# Patient Record
Sex: Male | Born: 1955
Health system: Southern US, Community
[De-identification: ages and names within clinical notes are randomized; demographics above are authoritative.]

## PROBLEM LIST (undated history)

## (undated) DIAGNOSIS — G5621 Lesion of ulnar nerve, right upper limb: Secondary | ICD-10-CM

## (undated) DIAGNOSIS — E785 Hyperlipidemia, unspecified: Secondary | ICD-10-CM

## (undated) DIAGNOSIS — N400 Enlarged prostate without lower urinary tract symptoms: Secondary | ICD-10-CM

## (undated) DIAGNOSIS — R39198 Other difficulties with micturition: Secondary | ICD-10-CM

## (undated) DIAGNOSIS — K432 Incisional hernia without obstruction or gangrene: Secondary | ICD-10-CM

## (undated) DIAGNOSIS — Q72819 Congenital shortening of unspecified lower limb: Secondary | ICD-10-CM

## (undated) DIAGNOSIS — K435 Parastomal hernia without obstruction or  gangrene: Secondary | ICD-10-CM

## (undated) DIAGNOSIS — M1990 Unspecified osteoarthritis, unspecified site: Secondary | ICD-10-CM

## (undated) DIAGNOSIS — Z933 Colostomy status: Secondary | ICD-10-CM

## (undated) DIAGNOSIS — I10 Essential (primary) hypertension: Secondary | ICD-10-CM

## (undated) DIAGNOSIS — R0600 Dyspnea, unspecified: Secondary | ICD-10-CM

## (undated) DIAGNOSIS — N529 Male erectile dysfunction, unspecified: Secondary | ICD-10-CM

## (undated) DIAGNOSIS — K219 Gastro-esophageal reflux disease without esophagitis: Secondary | ICD-10-CM

## (undated) DIAGNOSIS — G2581 Restless legs syndrome: Secondary | ICD-10-CM

## (undated) DIAGNOSIS — K4090 Unilateral inguinal hernia, without obstruction or gangrene, not specified as recurrent: Secondary | ICD-10-CM

## (undated) DIAGNOSIS — Z932 Ileostomy status: Secondary | ICD-10-CM

## (undated) DIAGNOSIS — R238 Other skin changes: Secondary | ICD-10-CM

## (undated) DIAGNOSIS — J690 Pneumonitis due to inhalation of food and vomit: Secondary | ICD-10-CM

## (undated) DIAGNOSIS — Z87442 Personal history of urinary calculi: Secondary | ICD-10-CM

## (undated) DIAGNOSIS — C20 Malignant neoplasm of rectum: Secondary | ICD-10-CM

## (undated) HISTORY — DX: Gastro-esophageal reflux disease without esophagitis: K21.9

## (undated) HISTORY — PX: HIP FRACTURE SURGERY: SHX118

## (undated) HISTORY — PX: COLON SURGERY: SHX602

## (undated) HISTORY — PX: JOINT REPLACEMENT: SHX530

## (undated) HISTORY — DX: Pneumonitis due to inhalation of food and vomit: J69.0

## (undated) HISTORY — PX: ILEOSTOMY: SHX1783

## (undated) HISTORY — PX: COLOSTOMY: SHX63

---
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
## 2005-08-04 ENCOUNTER — Emergency Department: Payer: Self-pay | Admitting: Emergency Medicine

## 2005-08-22 ENCOUNTER — Ambulatory Visit: Payer: Self-pay | Admitting: Internal Medicine

## 2008-03-18 ENCOUNTER — Ambulatory Visit (HOSPITAL_COMMUNITY): Admission: RE | Admit: 2008-03-18 | Discharge: 2008-03-18 | Payer: Self-pay | Admitting: Cardiovascular Disease

## 2013-10-27 ENCOUNTER — Encounter: Payer: Self-pay | Admitting: Family Medicine

## 2013-10-27 ENCOUNTER — Ambulatory Visit (INDEPENDENT_AMBULATORY_CARE_PROVIDER_SITE_OTHER): Payer: Self-pay | Admitting: Family Medicine

## 2013-10-27 VITALS — BP 140/88 | Ht 65.0 in | Wt 183.8 lb

## 2013-10-27 DIAGNOSIS — R079 Chest pain, unspecified: Secondary | ICD-10-CM

## 2013-10-27 DIAGNOSIS — K921 Melena: Secondary | ICD-10-CM

## 2013-10-27 DIAGNOSIS — M542 Cervicalgia: Secondary | ICD-10-CM

## 2013-10-27 NOTE — Progress Notes (Signed)
   Subjective:    Patient ID: [name], male    DOB: 1956-03-17, 58 y.o.   MRN: [id number]  HPI  Patient reported that he had eaten a lot of cheese and got "bound up" with severe constipation. Patient stated he strained a lot and felt lik something ripped inside and noticed bleeding since.  Patient also reports reflux at times. See prior notes did not get colonoscopy as encourage before  Notes throat discomfort. Has been comparing notes with folks at church. May have advised him he may have a cold or. At times he feels a swelling on the left side. No dysphasia. No sore throat.  Left shoulder discomfort, worse with certain motions.  And notes aching in chest and shoulder. On further history sometimes developed some left chest discomfort with exertion. It only when he is using his arm. Review of Systems No abdominal pain no back pain no rash no weight loss no aching ROS otherwise negative    Objective:   Physical Exam  Alert no apparent distress H&T neck supple left-sided nonspecific very faint tenderness. No discrete mass. Lungs clear. Heart regular in rhythm. Chest wall nontender. Abdomen benign. Rectal exam heme negative stool. No fissure prostate normal  EKG normal sinus rhythm question old anterior MI.    Assessment & Plan:  Impression intermittent hematochezia discussed #2 chest pain doubt serious etiology. Patient means well but he is not the best historian. I did an EKG which suggested old anterior MI. We are committed to further evaluate this. #3 nonspecific neck symptoms with concern regarding goiter. Plan GI referral. #2 cardiology referral. #3 ultrasound neck further recommendations based results. WSL

## 2013-10-28 ENCOUNTER — Encounter: Payer: Self-pay | Admitting: Family Medicine

## 2013-10-29 ENCOUNTER — Encounter: Payer: Self-pay | Admitting: Gastroenterology

## 2013-10-31 ENCOUNTER — Ambulatory Visit (HOSPITAL_COMMUNITY)
Admission: RE | Admit: 2013-10-31 | Discharge: 2013-10-31 | Disposition: A | Discharge status: Home / Self Care | Payer: Self-pay | Source: Ambulatory Visit | Attending: Family Medicine | Admitting: Family Medicine

## 2013-10-31 DIAGNOSIS — M542 Cervicalgia: Secondary | ICD-10-CM | POA: Insufficient documentation

## 2013-11-10 ENCOUNTER — Encounter: Payer: Self-pay | Admitting: Cardiology

## 2013-11-10 ENCOUNTER — Ambulatory Visit (INDEPENDENT_AMBULATORY_CARE_PROVIDER_SITE_OTHER): Payer: Self-pay | Admitting: Cardiology

## 2013-11-10 VITALS — BP 132/84 | HR 81 | Ht 65.0 in | Wt 180.0 lb

## 2013-11-10 DIAGNOSIS — R0789 Other chest pain: Secondary | ICD-10-CM | POA: Insufficient documentation

## 2013-11-10 DIAGNOSIS — Z87891 Personal history of nicotine dependence: Secondary | ICD-10-CM

## 2013-11-10 DIAGNOSIS — F17201 Nicotine dependence, unspecified, in remission: Secondary | ICD-10-CM | POA: Insufficient documentation

## 2013-11-10 DIAGNOSIS — R9431 Abnormal electrocardiogram [ECG] [EKG]: Secondary | ICD-10-CM

## 2013-11-10 NOTE — Assessment & Plan Note (Signed)
Seems to be most consistent with a musculoskeletal etiology. He does report history of elevated blood pressure at times, and his baseline ECG is abnormal, cannot exclude old anterior infarct pattern, although he is not aware of any history of CAD or infarct previously. For now he takes an aspirin daily and other supplements as outlined. We discussed a basic GXT for risk stratification. Unless these results are concerning, probably does not need further cardiac testing at this point.
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
## 2013-11-10 NOTE — Assessment & Plan Note (Signed)
States he quit smoking over a year ago.

## 2013-11-10 NOTE — Patient Instructions (Signed)
Your physician recommends that you schedule a follow-up appointment to be determined after stress test.we will call you with results    Your physician recommends that you continue on your current medications as directed. Please refer to the Current Medication list given to you today.   Thank you for choosing [facility] !

## 2013-11-11 ENCOUNTER — Encounter: Payer: Self-pay | Admitting: Cardiology

## 2013-11-17 ENCOUNTER — Ambulatory Visit (HOSPITAL_COMMUNITY)
Admission: RE | Admit: 2013-11-17 | Discharge: 2013-11-17 | Disposition: A | Discharge status: Home / Self Care | Payer: Self-pay | Source: Ambulatory Visit | Attending: Cardiology | Admitting: Cardiology

## 2013-11-17 ENCOUNTER — Encounter (HOSPITAL_COMMUNITY): Payer: Self-pay

## 2013-11-17 DIAGNOSIS — R0789 Other chest pain: Secondary | ICD-10-CM

## 2013-11-17 DIAGNOSIS — R9431 Abnormal electrocardiogram [ECG] [EKG]: Secondary | ICD-10-CM

## 2013-11-17 DIAGNOSIS — R079 Chest pain, unspecified: Secondary | ICD-10-CM | POA: Insufficient documentation

## 2013-11-17 NOTE — Progress Notes (Signed)
Stress Lab Nurses Notes - [name] 11/17/2013 Reason for doing test: Chest Pain Type of test: Regular GTX Nurse performing test: Gerrit Halls, RN Nuclear Medicine Tech: Not Applicable Echo Tech: Not Applicable MD performing test: Branch/K.Purcell Nails NP Family MD: Mickie Hillier Test explained and consent signed: yes IV started: No IV started Symptoms: Fatigue Treatment/Intervention: None Reason test stopped: fatigue and reached target HR After recovery IV was: NA Patient to return to [place]. Med at : NA Patient discharged: Home Patient's Condition upon discharge was: stable Comments: During test peak BP 173/99 & HR 146.  Recovery BP 147/98 & HR 85.  Symptoms resolved in recovery.  Geanie Cooley T

## 2013-11-17 NOTE — Progress Notes (Signed)
Patient ID: ROCHELL MABIE, male   DOB: 11-01-1955, 58 y.o.   MRN: [id number]  Test Indication:  pateint is a 58 yo with shoulder and neck pain.  Test to evaluate, r/o ischemia  Stress data:  Patientatient exercised 6 min 47 sec to a peak HR of 146 bpm  This was 89% predicted maximall.  Peak BP 173/99.  Patient experienced no CP  EKG showed no ST changes to suggest ischemia  Impression:  Exercise stress test:  Clinically and electrically negative for ischemia.

## 2013-11-20 ENCOUNTER — Ambulatory Visit: Payer: Self-pay | Admitting: Nurse Practitioner

## 2013-11-24 ENCOUNTER — Encounter: Payer: Self-pay | Admitting: Family Medicine

## 2013-11-24 ENCOUNTER — Telehealth: Payer: Self-pay

## 2013-11-24 NOTE — Telephone Encounter (Signed)
LM on patients personal voicemail that gxt was normal and he needs no further cardiac work up and to follow up with [facility]

## 2013-11-27 ENCOUNTER — Encounter: Payer: Self-pay | Admitting: Gastroenterology

## 2013-11-27 ENCOUNTER — Other Ambulatory Visit: Payer: Self-pay | Admitting: Gastroenterology

## 2013-11-27 ENCOUNTER — Ambulatory Visit (INDEPENDENT_AMBULATORY_CARE_PROVIDER_SITE_OTHER): Payer: Self-pay | Admitting: Gastroenterology

## 2013-11-27 VITALS — BP 151/95 | HR 70 | Temp 98.0°F | Ht 65.0 in | Wt 184.6 lb

## 2013-11-27 DIAGNOSIS — K219 Gastro-esophageal reflux disease without esophagitis: Secondary | ICD-10-CM

## 2013-11-27 DIAGNOSIS — K625 Hemorrhage of anus and rectum: Secondary | ICD-10-CM

## 2013-11-27 MED ORDER — OMEPRAZOLE 20 MG PO CPDR: DELAYED_RELEASE_CAPSULE | ORAL | Status: DC

## 2013-11-27 NOTE — Assessment & Plan Note (Signed)
IN SETTING OF CONSTIPATION AND HAVING TO DISIMPACT HIMSELF.  TCS FOR RECTAL BLEEDING MOVIPREP VOUCHER OPV IN 4 MOS

## 2013-11-27 NOTE — Progress Notes (Signed)
   Subjective:    Patient ID: [name], male    DOB: 1956/07/05, 58 y.o.   MRN: [id number]  Rubbie Battiest, MD  HPI PASSED BLOOD IN STOOL. 1 MO AGO ATE TOO MUCH CHEESE. STOOL GOT BOUND UP. COULDN'T PAS ANYTHING. HE WAS HURTING AND CRYING. HAD TO STICK FINGER IN BOWELS TO DISIMPACT HIMSELF. PASSED STRESS TEST LAST WEEK. HAS ACID REFLUX ALL THE TIME. FOR 6-8 MOS PAIN IN CHEST AND UP INTO HIS NECK. REFLUX ALL THE TIME. QUIT SMOKING 1.5 YRS AGO. GAINED SOME WEIGHT. OCCASIONAL PROBLEMS SWALLOWING SUPPLEMENTS. BMS; 2-3X/DAY. PT DENIES FEVER, CHILLS, nausea, vomiting, melena, diarrhea, constipation, OR abd pain.  Past Medical History  Diagnosis Date  . GERD (gastroesophageal reflux disease)     History reviewed. No pertinent past surgical history.  No Known Allergies  No current outpatient prescriptions on file.   No current facility-administered medications for this visit.    Family History  Problem Relation Age of Onset  . Colon cancer SISTER AGE 42   . Colon polyps Neg Hx     History  Substance Use Topics  . Smoking status: Former Smoker    Types: Cigarettes  . Smokeless tobacco: Not on file  . Alcohol Use: No               Review of Systems PER HPI OTHERWISE ALL SYSTEMS ARE NEGATIVE.     Objective:   Physical Exam  Vitals reviewed. Constitutional: He is oriented to person, place, and time. He appears well-nourished. No distress.  HENT:  Head: Normocephalic and atraumatic.  Mouth/Throat: Oropharynx is clear and moist. No oropharyngeal exudate.  Eyes: Pupils are equal, round, and reactive to light. No scleral icterus.  Neck: Normal range of motion. Neck supple.  Cardiovascular: Normal rate, regular rhythm and normal heart sounds.   Pulmonary/Chest: Effort normal and breath sounds normal. No respiratory distress.  Abdominal: Soft. Bowel sounds are normal. He exhibits no distension. There is no tenderness.  Musculoskeletal: He exhibits no edema.    Lymphadenopathy:    He has no cervical adenopathy.  Neurological: He is alert and oriented to person, place, and time.  NO FOCAL DEFICITS   Psychiatric: He has a normal mood and affect.          Assessment & Plan:

## 2013-11-27 NOTE — Patient Instructions (Signed)
UPPER AND LOWER ENDOSCOPY FOR RECTAL BLEEDING AND HAVING REFLUX  Use Prilosec 30 minutes prior to your first meal.  FOLLOW A HIGH FIBER/LOW FAT DIET. SEE INFO BELOW.   FOLLOW UP IN 4 MOS.    High-Fiber Diet A high-fiber diet changes your normal diet to include more whole grains, legumes, fruits, and vegetables. Changes in the diet involve replacing refined carbohydrates with unrefined foods. The calorie level of the diet is essentially unchanged. The Dietary Reference Intake (recommended amount) for adult males is 38 grams per day. For adult females, it is 25 grams per day. Pregnant and lactating women should consume 28 grams of fiber per day. Fiber is the intact part of a plant that is not broken down during digestion. Functional fiber is fiber that has been isolated from the plant to provide a beneficial effect in the body. PURPOSE  Increase stool bulk.   Ease and regulate bowel movements.   Lower cholesterol.  INDICATIONS THAT YOU NEED MORE FIBER  Constipation and hemorrhoids.   Uncomplicated diverticulosis (intestine condition) and irritable bowel syndrome.   Weight management.   As a protective measure against hardening of the arteries (atherosclerosis), diabetes, and cancer.   GUIDELINES FOR INCREASING FIBER IN THE DIET  Start adding fiber to the diet slowly. A gradual increase of about 5 more grams (2 slices of whole-wheat bread, 2 servings of most fruits or vegetables, or 1 bowl of high-fiber cereal) per day is best. Too rapid an increase in fiber may result in constipation, flatulence, and bloating.   Drink enough water and fluids to keep your urine clear or pale yellow. Water, juice, or caffeine-free drinks are recommended. Not drinking enough fluid may cause constipation.   Eat a variety of high-fiber foods rather than one type of fiber.   Try to increase your intake of fiber through using high-fiber foods rather than fiber pills or supplements that contain small  amounts of fiber.   The goal is to change the types of food eaten. Do not supplement your present diet with high-fiber foods, but replace foods in your present diet.  INCLUDE A VARIETY OF FIBER SOURCES  Replace refined and processed grains with whole grains, canned fruits with fresh fruits, and incorporate other fiber sources. White rice, white breads, and most bakery goods contain little or no fiber.   Brown whole-grain rice, buckwheat oats, and many fruits and vegetables are all good sources of fiber. These include: broccoli, Brussels sprouts, cabbage, cauliflower, beets, sweet potatoes, white potatoes (skin on), carrots, tomatoes, eggplant, squash, berries, fresh fruits, and dried fruits.   Cereals appear to be the richest source of fiber. Cereal fiber is found in whole grains and bran. Bran is the fiber-rich outer coat of cereal grain, which is largely removed in refining. In whole-grain cereals, the bran remains. In breakfast cereals, the largest amount of fiber is found in those with "bran" in their names. The fiber content is sometimes indicated on the label.   You may need to include additional fruits and vegetables each day.   In baking, for 1 cup white flour, you may use the following substitutions:   1 cup whole-wheat flour minus 2 tablespoons.   1/2 cup white flour plus 1/2 cup whole-wheat flour.   Low-Fat Diet BREADS, CEREALS, PASTA, RICE, DRIED PEAS, AND BEANS These products are high in carbohydrates and most are low in fat. Therefore, they can be increased in the diet as substitutes for fatty foods. They too, however, contain calories and should  not be eaten in excess. Cereals can be eaten for snacks as well as for breakfast.   FRUITS AND VEGETABLES It is good to eat fruits and vegetables. Besides being sources of fiber, both are rich in vitamins and some minerals. They help you get the daily allowances of these nutrients. Fruits and vegetables can be used for snacks and  desserts.  MEATS Limit lean meat, chicken, [place], and fish to no more than 6 ounces per day. Beef, Pork, and Lamb Use lean cuts of beef, pork, and lamb. Lean cuts include:  Extra-lean ground beef.  Arm roast.  Sirloin tip.  Center-cut ham.  Round steak.  Loin chops.  Rump roast.  Tenderloin.  Trim all fat off the outside of meats before cooking. It is not necessary to severely decrease the intake of red meat, but lean choices should be made. Lean meat is rich in protein and contains a highly absorbable form of iron. Premenopausal women, in particular, should avoid reducing lean red meat because this could increase the risk for low red blood cells (iron-deficiency anemia).  Chicken and [place] These are good sources of protein. The fat of poultry can be reduced by removing the skin and underlying fat layers before cooking. Chicken and [place] can be substituted for lean red meat in the diet. Poultry should not be fried or covered with high-fat sauces. Fish and Shellfish Fish is a good source of protein. Shellfish contain cholesterol, but they usually are low in saturated fatty acids. The preparation of fish is important. Like chicken and [place], they should not be fried or covered with high-fat sauces. EGGS Egg whites contain no fat or cholesterol. They can be eaten often. Try 1 to 2 egg whites instead of whole eggs in recipes or use egg substitutes that do not contain yolk. MILK AND DAIRY PRODUCTS Use skim or 1% milk instead of 2% or whole milk. Decrease whole milk, natural, and processed cheeses. Use nonfat or low-fat (2%) cottage cheese or low-fat cheeses made from vegetable oils. Choose nonfat or low-fat (1 to 2%) yogurt. Experiment with evaporated skim milk in recipes that call for heavy cream. Substitute low-fat yogurt or low-fat cottage cheese for sour cream in dips and salad dressings. Have at least 2 servings of low-fat dairy products, such as 2 glasses of skim (or 1%) milk each day to  help get your daily calcium intake. FATS AND OILS Reduce the total intake of fats, especially saturated fat. Butterfat, lard, and beef fats are high in saturated fat and cholesterol. These should be avoided as much as possible. Vegetable fats do not contain cholesterol, but certain vegetable fats, such as coconut oil, palm oil, and palm kernel oil are very high in saturated fats. These should be limited. These fats are often used in bakery goods, processed foods, popcorn, oils, and nondairy creamers. Vegetable shortenings and some peanut butters contain hydrogenated oils, which are also saturated fats. Read the labels on these foods and check for saturated vegetable oils. Unsaturated vegetable oils and fats do not raise blood cholesterol. However, they should be limited because they are fats and are high in calories. Total fat should still be limited to 30% of your daily caloric intake. Desirable liquid vegetable oils are corn oil, cottonseed oil, olive oil, canola oil, safflower oil, soybean oil, and sunflower oil. Peanut oil is not as good, but small amounts are acceptable. Buy a heart-healthy tub margarine that has no partially hydrogenated oils in the ingredients. Mayonnaise and salad dressings often  are made from unsaturated fats, but they should also be limited because of their high calorie and fat content. Seeds, nuts, peanut butter, olives, and avocados are high in fat, but the fat is mainly the unsaturated type. These foods should be limited mainly to avoid excess calories and fat. OTHER EATING TIPS Snacks  Most sweets should be limited as snacks. They tend to be rich in calories and fats, and their caloric content outweighs their nutritional value. Some good choices in snacks are graham crackers, melba toast, soda crackers, bagels (no egg), English muffins, fruits, and vegetables. These snacks are preferable to snack crackers, [place] fries, TORTILLA CHIPS, and POTATO chips. Popcorn should be  air-popped or cooked in small amounts of liquid vegetable oil. Desserts Eat fruit, low-fat yogurt, and fruit ices instead of pastries, cake, and cookies. Sherbet, angel food cake, gelatin dessert, frozen low-fat yogurt, or other frozen products that do not contain saturated fat (pure fruit juice bars, frozen ice pops) are also acceptable.  COOKING METHODS Choose those methods that use little or no fat. They include: Poaching.  Braising.  Steaming.  Grilling.  Baking.  Stir-frying.  Broiling.  Microwaving.  Foods can be cooked in a nonstick pan without added fat, or use a nonfat cooking spray in regular cookware. Limit fried foods and avoid frying in saturated fat. Add moisture to lean meats by using water, broth, cooking wines, and other nonfat or low-fat sauces along with the cooking methods mentioned above. Soups and stews should be chilled after cooking. The fat that forms on top after a few hours in the refrigerator should be skimmed off. When preparing meals, avoid using excess salt. Salt can contribute to raising blood pressure in some people.  EATING AWAY FROM HOME Order entres, potatoes, and vegetables without sauces or butter. When meat exceeds the size of a deck of cards (3 to 4 ounces), the rest can be taken home for another meal. Choose vegetable or fruit salads and ask for low-calorie salad dressings to be served on the side. Use dressings sparingly. Limit high-fat toppings, such as bacon, crumbled eggs, cheese, sunflower seeds, and olives. Ask for heart-healthy tub margarine instead of butter.

## 2013-11-27 NOTE — Progress Notes (Signed)
Reminder in epic °

## 2013-11-27 NOTE — Progress Notes (Signed)
Cc'd to pcp 

## 2013-11-27 NOTE — Assessment & Plan Note (Signed)
SX UNCONTROLLED OFF PPI.  EGD FOR BARRETT'S SURVEILLANCE/DYSPEPSIA. EXPLAINED BENEFITS OR SCREENING. ADD PRILOSEC OPV IN 4 MOS

## 2013-12-11 ENCOUNTER — Encounter (HOSPITAL_COMMUNITY): Payer: Self-pay | Admitting: Pharmacy Technician

## 2013-12-22 ENCOUNTER — Ambulatory Visit (HOSPITAL_COMMUNITY)
Admission: RE | Admit: 2013-12-22 | Discharge: 2013-12-22 | Disposition: A | Discharge status: Home / Self Care | Payer: Self-pay | Source: Ambulatory Visit | Attending: Gastroenterology | Admitting: Gastroenterology

## 2013-12-22 ENCOUNTER — Encounter (HOSPITAL_COMMUNITY)
Admission: RE | Disposition: A | Discharge status: Home / Self Care | Payer: Self-pay | Source: Ambulatory Visit | Attending: Gastroenterology

## 2013-12-22 ENCOUNTER — Encounter (HOSPITAL_COMMUNITY): Payer: Self-pay | Admitting: *Deleted

## 2013-12-22 DIAGNOSIS — C20 Malignant neoplasm of rectum: Secondary | ICD-10-CM | POA: Insufficient documentation

## 2013-12-22 DIAGNOSIS — D129 Benign neoplasm of anus and anal canal: Secondary | ICD-10-CM

## 2013-12-22 DIAGNOSIS — K219 Gastro-esophageal reflux disease without esophagitis: Secondary | ICD-10-CM | POA: Insufficient documentation

## 2013-12-22 DIAGNOSIS — K648 Other hemorrhoids: Secondary | ICD-10-CM | POA: Insufficient documentation

## 2013-12-22 DIAGNOSIS — K2970 Gastritis, unspecified, without bleeding: Secondary | ICD-10-CM | POA: Insufficient documentation

## 2013-12-22 DIAGNOSIS — K5730 Diverticulosis of large intestine without perforation or abscess without bleeding: Secondary | ICD-10-CM | POA: Insufficient documentation

## 2013-12-22 DIAGNOSIS — Z87891 Personal history of nicotine dependence: Secondary | ICD-10-CM | POA: Insufficient documentation

## 2013-12-22 DIAGNOSIS — D128 Benign neoplasm of rectum: Secondary | ICD-10-CM

## 2013-12-22 DIAGNOSIS — R0789 Other chest pain: Secondary | ICD-10-CM

## 2013-12-22 DIAGNOSIS — K625 Hemorrhage of anus and rectum: Secondary | ICD-10-CM

## 2013-12-22 DIAGNOSIS — Z800 Family history of malignant neoplasm of digestive organs: Secondary | ICD-10-CM | POA: Insufficient documentation

## 2013-12-22 DIAGNOSIS — K2990 Gastroduodenitis, unspecified, without bleeding: Secondary | ICD-10-CM

## 2013-12-22 HISTORY — PX: ESOPHAGOGASTRODUODENOSCOPY: SHX5428

## 2013-12-22 HISTORY — PX: COLONOSCOPY: SHX5424

## 2013-12-22 LAB — KOH PREP

## 2013-12-22 SURGERY — COLONOSCOPY
Anesthesia: Moderate Sedation

## 2013-12-22 MED ORDER — MEPERIDINE HCL 100 MG/ML IJ SOLN
INTRAMUSCULAR | Status: AC
  Filled 2013-12-22: qty 2

## 2013-12-22 MED ORDER — MEPERIDINE HCL 100 MG/ML IJ SOLN
INTRAMUSCULAR | Status: DC | PRN
  Administered 2013-12-22 (×4): 25 mg via INTRAVENOUS

## 2013-12-22 MED ORDER — SIMETHICONE 40 MG/0.6ML PO SUSP
ORAL | Status: DC | PRN
  Administered 2013-12-22: 09:00:00

## 2013-12-22 MED ORDER — SODIUM CHLORIDE 0.9 % IV SOLN
INTRAVENOUS | Status: DC
  Administered 2013-12-22: 08:00:00 via INTRAVENOUS

## 2013-12-22 MED ORDER — LIDOCAINE VISCOUS 2 % MT SOLN
OROMUCOSAL | Status: DC | PRN
  Administered 2013-12-22: 2 mL via OROMUCOSAL

## 2013-12-22 MED ORDER — MIDAZOLAM HCL 5 MG/5ML IJ SOLN
INTRAMUSCULAR | Status: DC | PRN
  Administered 2013-12-22: 1 mg via INTRAVENOUS
  Administered 2013-12-22 (×2): 2 mg via INTRAVENOUS
  Administered 2013-12-22: 1 mg via INTRAVENOUS

## 2013-12-22 MED ORDER — MIDAZOLAM HCL 5 MG/5ML IJ SOLN
INTRAMUSCULAR | Status: AC
  Filled 2013-12-22: qty 10

## 2013-12-22 MED ORDER — LIDOCAINE VISCOUS 2 % MT SOLN
OROMUCOSAL | Status: AC
  Filled 2013-12-22: qty 15

## 2013-12-22 NOTE — Discharge Instructions (Signed)
You had ONE LARGE POLYP IN YOUR RECTUM THAT I COULD NOT REMOVE. You have internal hemorrhoids & DIVERTICULOSIS IN YOUR LEFT AND RIGHT COLON. You have gastritis.  I biopsied your ESOPHAGUS, stomach, AND COLON.    FOLLOW RECOMMENDATIONS FOR CONTROLLING REFLUX. SEE INFO BELOW.  CONTINUE OMEPRAZOLE. TAKE 30 MINUTES PRIOR TO BREAKFAST.   AVOID ITEMS THAT TRIGGER GASTRITIS. SEE INFO BELOW.  FOLLOW A HIGH FIBER/LOW FAT DIET. AVOID ITEMS THAT CAUSE BLOATING. SEE INFO BELOW.  YOUR BIOPSY RESULTS WILL BE BACK IN 3-5 DAYS.  Next colonoscopy in 6 MOS TO 1 YEAR. ALL FIRST DEGREE RELATIVES NEED A COLONOSCOPY AT AGE 75.    ENDOSCOPY Care After Read the instructions outlined below and refer to this sheet in the next week. These discharge instructions provide you with general information on caring for yourself after you leave the hospital. While your treatment has been planned according to the most current medical practices available, unavoidable complications occasionally occur. If you have any problems or questions after discharge, call DR. FIELDS, [phone].  ACTIVITY  You may resume your regular activity, but move at a slower pace for the next 24 hours.   Take frequent rest periods for the next 24 hours.   Walking will help get rid of the air and reduce the bloated feeling in your belly (abdomen).   No driving for 24 hours (because of the medicine (anesthesia) used during the test).   You may shower.   Do not sign any important legal documents or operate any machinery for 24 hours (because of the anesthesia used during the test).    NUTRITION  Drink plenty of fluids.   You may resume your normal diet as instructed by your doctor.   Begin with a light meal and progress to your normal diet. Heavy or fried foods are harder to digest and may make you feel sick to your stomach (nauseated).   Avoid alcoholic beverages for 24 hours or as instructed.    MEDICATIONS  You may resume your  normal medications.   WHAT YOU CAN EXPECT TODAY  Some feelings of bloating in the abdomen.   Passage of more gas than usual.   Spotting of blood in your stool or on the toilet paper  .  IF YOU HAD POLYPS REMOVED DURING THE ENDOSCOPY:  Eat a soft diet IF YOU HAVE NAUSEA, BLOATING, ABDOMINAL PAIN, OR VOMITING.    FINDING OUT THE RESULTS OF YOUR TEST Not all test results are available during your visit. DR. Darrick PennaFIELDS WILL CALL YOU WITHIN 7 DAYS OF YOUR PROCEDUE WITH YOUR RESULTS. Do not assume everything is normal if you have not heard from DR. FIELDS IN ONE WEEK, CALL HER OFFICE AT [phone].  SEEK IMMEDIATE MEDICAL ATTENTION AND CALL THE OFFICE: [phone] IF:  You have more than a spotting of blood in your stool.   Your belly is swollen (abdominal distention).   You are nauseated or vomiting.   You have a temperature over 101F.   You have abdominal pain or discomfort that is severe or gets worse throughout the day.    Lifestyle and home remedies TO HELP CONTROL REFLUX  You may eliminate or reduce the frequency of heartburn by making the following lifestyle changes:    Control your weight. Being overweight is a major risk factor for heartburn and GERD. Excess pounds put pressure on your abdomen, pushing up your stomach and causing acid to back up into your esophagus.     Eat smaller meals. 4  TO 6 MEALS A DAY. This reduces pressure on the lower esophageal sphincter, helping to prevent the valve from opening and acid from washing back into your esophagus.     Loosen your belt. Clothes that fit tightly around your waist put pressure on your abdomen and the lower esophageal sphincter.     Eliminate heartburn triggers. Everyone has specific triggers. Common triggers such as fatty or fried foods, spicy food, tomato sauce, carbonated beverages, alcohol, chocolate, mint, garlic, onion, caffeine and nicotine may make heartburn worse.     Avoid stooping or bending. Tying your  shoes is OK. Bending over for longer periods to weed your garden isn't, especially soon after eating.     Don't lie down after a meal. Wait at least three to four hours after eating before going to bed, and don't lie down right after eating.    Alternative medicine   Several home remedies exist for treating GERD, but they provide only temporary relief. They include drinking baking soda (sodium bicarbonate) added to water or drinking other fluids such as baking soda mixed with cream of tartar and water.    Although these liquids create temporary relief by neutralizing, washing away or buffering acids, eventually they aggravate the situation by adding gas and fluid to your stomach, increasing pressure and causing more acid reflux. Further, adding more sodium to your diet may increase your blood pressure and add stress to your heart, and excessive bicarbonate ingestion can alter the acid-base balance in your body.  Gastritis  Gastritis is an inflammation (the body's way of reacting to injury and/or infection) of the stomach. It is often caused by viral or bacterial (germ) infections. It can also be caused BY ASPIRIN, BC/GOODY POWDER'S, (IBUPROFEN) MOTRIN, OR ALEVE (NAPROXEN), chemicals (including alcohol), SPICY FOODS, and medications. This illness may be associated with generalized malaise (feeling tired, not well), UPPER ABDOMINAL STOMACH cramps, and fever. One common bacterial cause of gastritis is an organism known as H. Pylori. This can be treated with antibiotics.    High-Fiber Diet A high-fiber diet changes your normal diet to include more whole grains, legumes, fruits, and vegetables. Changes in the diet involve replacing refined carbohydrates with unrefined foods. The calorie level of the diet is essentially unchanged. The Dietary Reference Intake (recommended amount) for adult males is 38 grams per day. For adult females, it is 25 grams per day. Pregnant and lactating women should consume 28  grams of fiber per day. Fiber is the intact part of a plant that is not broken down during digestion. Functional fiber is fiber that has been isolated from the plant to provide a beneficial effect in the body. PURPOSE  Increase stool bulk.   Ease and regulate bowel movements.   Lower cholesterol.  INDICATIONS THAT YOU NEED MORE FIBER  Constipation and hemorrhoids.   Uncomplicated diverticulosis (intestine condition) and irritable bowel syndrome.   Weight management.   As a protective measure against hardening of the arteries (atherosclerosis), diabetes, and cancer.   GUIDELINES FOR INCREASING FIBER IN THE DIET  Start adding fiber to the diet slowly. A gradual increase of about 5 more grams (2 slices of whole-wheat bread, 2 servings of most fruits or vegetables, or 1 bowl of high-fiber cereal) per day is best. Too rapid an increase in fiber may result in constipation, flatulence, and bloating.   Drink enough water and fluids to keep your urine clear or pale yellow. Water, juice, or caffeine-free drinks are recommended. Not drinking enough fluid may cause  constipation.   Eat a variety of high-fiber foods rather than one type of fiber.   Try to increase your intake of fiber through using high-fiber foods rather than fiber pills or supplements that contain small amounts of fiber.   The goal is to change the types of food eaten. Do not supplement your present diet with high-fiber foods, but replace foods in your present diet.  INCLUDE A VARIETY OF FIBER SOURCES  Replace refined and processed grains with whole grains, canned fruits with fresh fruits, and incorporate other fiber sources. White rice, white breads, and most bakery goods contain little or no fiber.   Brown whole-grain rice, buckwheat oats, and many fruits and vegetables are all good sources of fiber. These include: broccoli, Brussels sprouts, cabbage, cauliflower, beets, sweet potatoes, white potatoes (skin on), carrots,  tomatoes, eggplant, squash, berries, fresh fruits, and dried fruits.   Cereals appear to be the richest source of fiber. Cereal fiber is found in whole grains and bran. Bran is the fiber-rich outer coat of cereal grain, which is largely removed in refining. In whole-grain cereals, the bran remains. In breakfast cereals, the largest amount of fiber is found in those with "bran" in their names. The fiber content is sometimes indicated on the label.   You may need to include additional fruits and vegetables each day.   In baking, for 1 cup white flour, you may use the following substitutions:   1 cup whole-wheat flour minus 2 tablespoons.   1/2 cup white flour plus 1/2 cup whole-wheat flour.   Low-Fat Diet BREADS, CEREALS, PASTA, RICE, DRIED PEAS, AND BEANS These products are high in carbohydrates and most are low in fat. Therefore, they can be increased in the diet as substitutes for fatty foods. They too, however, contain calories and should not be eaten in excess. Cereals can be eaten for snacks as well as for breakfast.  Include foods that contain fiber (fruits, vegetables, whole grains, and legumes). Research shows that fiber may lower blood cholesterol levels, especially the water-soluble fiber found in fruits, vegetables, oat products, and legumes. FRUITS AND VEGETABLES It is good to eat fruits and vegetables. Besides being sources of fiber, both are rich in vitamins and some minerals. They help you get the daily allowances of these nutrients. Fruits and vegetables can be used for snacks and desserts. MEATS Limit lean meat, chicken, [place], and fish to no more than 6 ounces per day. Beef, Pork, and Lamb Use lean cuts of beef, pork, and lamb. Lean cuts include:  Extra-lean ground beef.  Arm roast.  Sirloin tip.  Center-cut ham.  Round steak.  Loin chops.  Rump roast.  Tenderloin.  Trim all fat off the outside of meats before cooking. It is not necessary to severely decrease the  intake of red meat, but lean choices should be made. Lean meat is rich in protein and contains a highly absorbable form of iron. Premenopausal women, in particular, should avoid reducing lean red meat because this could increase the risk for low red blood cells (iron-deficiency anemia). The organ meats, such as liver, sweetbreads, kidneys, and brain are very rich in cholesterol. They should be limited. Chicken and [place] These are good sources of protein. The fat of poultry can be reduced by removing the skin and underlying fat layers before cooking. Chicken and [place] can be substituted for lean red meat in the diet. Poultry should not be fried or covered with high-fat sauces. Fish and Shellfish Fish is a good source  of protein. Shellfish contain cholesterol, but they usually are low in saturated fatty acids. The preparation of fish is important. Like chicken and [place], they should not be fried or covered with high-fat sauces. EGGS Egg whites contain no fat or cholesterol. They can be eaten often. Try 1 to 2 egg whites instead of whole eggs in recipes or use egg substitutes that do not contain yolk. MILK AND DAIRY PRODUCTS Use skim or 1% milk instead of 2% or whole milk. Decrease whole milk, natural, and processed cheeses. Use nonfat or low-fat (2%) cottage cheese or low-fat cheeses made from vegetable oils. Choose nonfat or low-fat (1 to 2%) yogurt. Experiment with evaporated skim milk in recipes that call for heavy cream. Substitute low-fat yogurt or low-fat cottage cheese for sour cream in dips and salad dressings. Have at least 2 servings of low-fat dairy products, such as 2 glasses of skim (or 1%) milk each day to help get your daily calcium intake.  FATS AND OILS Reduce the total intake of fats, especially saturated fat. Butterfat, lard, and beef fats are high in saturated fat and cholesterol. These should be avoided as much as possible. Vegetable fats do not contain cholesterol, but certain  vegetable fats, such as coconut oil, palm oil, and palm kernel oil are very high in saturated fats. These should be limited. These fats are often used in bakery goods, processed foods, popcorn, oils, and nondairy creamers. Vegetable shortenings and some peanut butters contain hydrogenated oils, which are also saturated fats. Read the labels on these foods and check for saturated vegetable oils. Unsaturated vegetable oils and fats do not raise blood cholesterol. However, they should be limited because they are fats and are high in calories. Total fat should still be limited to 30% of your daily caloric intake. Desirable liquid vegetable oils are corn oil, cottonseed oil, olive oil, canola oil, safflower oil, soybean oil, and sunflower oil. Peanut oil is not as good, but small amounts are acceptable. Buy a heart-healthy tub margarine that has no partially hydrogenated oils in the ingredients. Mayonnaise and salad dressings often are made from unsaturated fats, but they should also be limited because of their high calorie and fat content. Seeds, nuts, peanut butter, olives, and avocados are high in fat, but the fat is mainly the unsaturated type. These foods should be limited mainly to avoid excess calories and fat. OTHER EATING TIPS Snacks  Most sweets should be limited as snacks. They tend to be rich in calories and fats, and their caloric content outweighs their nutritional value. Some good choices in snacks are graham crackers, melba toast, soda crackers, bagels (no egg), English muffins, fruits, and vegetables. These snacks are preferable to snack crackers, [place] fries, and chips. Popcorn should be air-popped or cooked in small amounts of liquid vegetable oil. Desserts Eat fruit, low-fat yogurt, and fruit ices. AVOID pastries, cake, and cookies. Sherbet, angel food cake, gelatin dessert, frozen low-fat yogurt, or other frozen products that do not contain saturated fat (pure fruit juice bars, frozen ice  pops) are also acceptable.  COOKING METHODS Choose those methods that use little or no fat. They include: Poaching.  Braising.  Steaming.  Grilling.  Baking.  Stir-frying.  Broiling.  Microwaving.  Foods can be cooked in a nonstick pan without added fat, or use a nonfat cooking spray in regular cookware. Limit fried foods and avoid frying in saturated fat. Add moisture to lean meats by using water, broth, cooking wines, and other nonfat or low-fat sauces  along with the cooking methods mentioned above. Soups and stews should be chilled after cooking. The fat that forms on top after a few hours in the refrigerator should be skimmed off. When preparing meals, avoid using excess salt. Salt can contribute to raising blood pressure in some people. EATING AWAY FROM HOME Order entres, potatoes, and vegetables without sauces or butter. When meat exceeds the size of a deck of cards (3 to 4 ounces), the rest can be taken home for another meal. Choose vegetable or fruit salads and ask for low-calorie salad dressings to be served on the side. Use dressings sparingly. Limit high-fat toppings, such as bacon, crumbled eggs, cheese, sunflower seeds, and olives. Ask for heart-healthy tub margarine instead of butter.   Diverticulosis Diverticulosis is a common condition that develops when small pouches (diverticula) form in the wall of the colon. The risk of diverticulosis increases with age. It happens more often in people who eat a low-fiber diet. Most individuals with diverticulosis have no symptoms. Those individuals with symptoms usually experience belly (abdominal) pain, constipation, or loose stools (diarrhea).  HOME CARE INSTRUCTIONS  Increase the amount of fiber in your diet as directed by your caregiver or dietician. This may reduce symptoms of diverticulosis.   Drink at least 6 to 8 glasses of water each day to prevent constipation.   Try not to strain when you have a bowel movement.   Avoiding  nuts and seeds to prevent complications is still an uncertain benefit.   FOODS HAVING HIGH FIBER CONTENT INCLUDE:  Fruits. Apple, peach, pear, tangerine, raisins, prunes.   Vegetables. Brussels sprouts, asparagus, broccoli, cabbage, carrot, cauliflower, romaine lettuce, spinach, summer squash, tomato, winter squash, zucchini.   Starchy Vegetables. Baked beans, kidney beans, lima beans, split peas, lentils, potatoes (with skin).   Grains. Whole wheat bread, brown rice, bran flake cereal, plain oatmeal, white rice, shredded wheat, bran muffins.    SEEK IMMEDIATE MEDICAL CARE IF:  You develop increasing pain or severe bloating.   You have an oral temperature above 101F.   You develop vomiting or bowel movements that are bloody or black.   Hemorrhoids Hemorrhoids are dilated (enlarged) veins around the rectum. Sometimes clots will form in the veins. This makes them swollen and painful. These are called thrombosed hemorrhoids. Causes of hemorrhoids include:  Constipation.   Straining to have a bowel movement.   HEAVY LIFTING  HOME CARE INSTRUCTIONS  Eat a well balanced diet and drink 6 to 8 glasses of water every day to avoid constipation. You may also use a bulk laxative.   Avoid straining to have bowel movements.   Keep anal area dry and clean.   Do not use a donut shaped pillow or sit on the toilet for long periods. This increases blood pooling and pain.   Move your bowels when your body has the urge; this will require less straining and will decrease pain and pressure.

## 2013-12-22 NOTE — Interval H&P Note (Signed)
History and Physical Interval Note:  12/22/2013 8:14 AM  [name]  has presented today for surgery, with the diagnosis of RECTAL BLEEDING AND GERD  The various methods of treatment have been discussed with the patient and family. After consideration of risks, benefits and other options for treatment, the patient has consented to  Procedure(s) with comments: COLONOSCOPY (N/A) - 8:30 ESOPHAGOGASTRODUODENOSCOPY (EGD) (N/A) as a surgical intervention .  The patient's history has been reviewed, patient examined, no change in status, stable for surgery.  I have reviewed the patient's chart and labs.  Questions were answered to the patient's satisfaction.     Sandi L Fields

## 2013-12-22 NOTE — Op Note (Signed)
[facility] [address] [place], [address]   COLONOSCOPY PROCEDURE REPORT  PATIENT: [name], [name]  MR#: [id number] BIRTHDATE: Jan 04, 1956 , 58  yrs. old GENDER: Male ENDOSCOPIST: Barney Drain, MD REFERRED IN:[facility] Wolfgang Phoenix, M.D. PROCEDURE DATE:  12/22/2013 PROCEDURE:   Colonoscopy with biopsy INDICATIONS:Rectal Bleeding. MEDICATIONS: Demerol 75 mg IV and Versed 5 mg IV  DESCRIPTION OF PROCEDURE:    Physical exam was performed.  Informed consent was obtained from the patient after explaining the benefits, risks, and alternatives to procedure.  The patient was connected to monitor and placed in left lateral position. Continuous oxygen was provided by nasal cannula and IV medicine administered through an indwelling cannula.  After administration of sedation and rectal exam, the patients rectum was intubated and the EC-3890Li ([id number])  colonoscope was advanced under direct visualization to the ileum.  The scope was removed slowly by carefully examining the color, texture, anatomy, and integrity mucosa on the way out.  The patient was recovered in endoscopy and discharged home in satisfactory condition.    COLON FINDINGS: The mucosa appeared normal in the terminal ileum.  , There was moderate diverticulosis noted in the transverse colon, descending colon, and sigmoid colon with associated muscular hypertrophy and tortuosity.  , A mass measuring 1.5 X 3cm in size with friable surfaces was found in the rectum.  Multiple biopsies were performed using hot jumbo forceps.  , and Moderate sized internal hemorrhoids were found.  PREP QUALITY: good CECAL W/D TIME: 24 minutes    COMPLICATIONS: None  ENDOSCOPIC IMPRESSION: 1.   Normal mucosa in the terminal ileum 2.   Moderate diverticulosis  in the transverse colon, descending colon, and sigmoid colon 3.   RECTAL BLEEDING DUE TO RECTAL Mass & HEMORRHOIDS 4.   Moderate sized internal  hemorrhoids  RECOMMENDATIONS: FOLLOW RECOMMENDATIONS FOR CONTROLLING REFLUX. CONTINUE OMEPRAZOLE.  TAKE 30 MINUTES PRIOR TO BREAKFAST. AVOID ITEMS THAT TRIGGER GASTRITIS. FOLLOW A HIGH FIBER/LOW FAT DIET.  AVOID ITEMS THAT CAUSE BLOATING.  BIOPSY RESULTS WILL BE BACK IN 7 DAYS. Next colonoscopy in 6 MOS TO 1 YEAR.  ALL FIRST DEGREE RELATIVES NEED A COLONOSCOPY AT AGE 20.       _______________________________ eSignedBarney Drain, MD 12/22/2013 9:55 AM

## 2013-12-22 NOTE — Op Note (Signed)
[facility] [address] [place], [address]   ENDOSCOPY PROCEDURE REPORT  PATIENT: [name], [name]  MR#: [id number] BIRTHDATE: 01/28/1956 , 58  yrs. old GENDER: Male  ENDOSCOPIST: Barney Drain, MD REFERRED EG:[facility] Wolfgang Phoenix, M.D. PROCEDURE DATE: 12/22/2013 PROCEDURE:   EGD w/ CODL FORCEPS/BRUSH biopsy  INDICATIONS:Dyspepsia.   Barrett's screening. PT USES ALEVE. MEDICATIONS: TCS+ Demerol 25 mg IV and Versed 1mg  IV TOPICAL ANESTHETIC:   Viscous Xylocaine  DESCRIPTION OF PROCEDURE:     Physical exam was performed.  Informed consent was obtained from the patient after explaining the benefits, risks, and alternatives to the procedure.  The patient was connected to the monitor and placed in the left lateral position.  Continuous oxygen was provided by nasal cannula and IV medicine administered through an indwelling cannula.  After administration of sedation, the patients esophagus was intubated and the EC-3890Li ([id number]) and [id number] ([id number])  endoscope was advanced under direct visualization to the second portion of the duodenum.  The scope was removed slowly by carefully examining the color, texture, anatomy, and integrity of the mucosa on the way out.  The patient was recovered in endoscopy and discharged home in satisfactory condition.   ESOPHAGUS: WHITE PLAQUES ONE ESOPHAGUS.  BRUSH BIOPSIES OBTAINED FOR KOH.  2 CM TONGUES OF SALMON COLRED MUCOSA EXTENDING FROM THE Z LINE.  BIOPSIES OBTAINED.   STOMACH: OCCASIONAL EROSION AND FAIRLY DIFFUSE ERYTHEM IN THE GASTRIC ANTRUM.  COLD FORCEPS BIOPSIES OBTAINED.   DUODENUM: The duodenal mucosa showed no abnormalities in the bulb and second portion of the duodenum.  COMPLICATIONS:   None  ENDOSCOPIC IMPRESSION: 1.   PROBABLE CANDIDA ESOPHAGITIS 2.   PROBABLE BARRETT'S ESOPHAGUS 3.   MILD GASTRITIS  RECOMMENDATIONS: FOLLOW RECOMMENDATIONS FOR CONTROLLING REFLUX. CONTINUE OMEPRAZOLE.  TAKE 30 MINUTES PRIOR  TO BREAKFAST. AVOID ITEMS THAT TRIGGER GASTRITIS. FOLLOW A HIGH FIBER/LOW FAT DIET.  AVOID ITEMS THAT CAUSE BLOATING.  BIOPSY RESULTS WILL BE BACK IN 3-5 DAYS. Next colonoscopy in 6 MOS TO 1 YEAR.  ALL FIRST DEGREE RELATIVES NEED A COLONOSCOPY AT AGE 43.   REPEAT EXAM:   _______________________________ Lorrin MaisBarney Drain, MD 12/22/2013 10:01 AM

## 2013-12-22 NOTE — H&P (View-Only) (Signed)
   Subjective:    Patient ID: [name], male    DOB: 01/02/1956, 58 y.o.   MRN: [id number]  LUKING,W S, MD  HPI PASSED BLOOD IN STOOL. 1 MO AGO ATE TOO MUCH CHEESE. STOOL GOT BOUND UP. COULDN'T PAS ANYTHING. HE WAS HURTING AND CRYING. HAD TO STICK FINGER IN BOWELS TO DISIMPACT HIMSELF. PASSED STRESS TEST LAST WEEK. HAS ACID REFLUX ALL THE TIME. FOR 6-8 MOS PAIN IN CHEST AND UP INTO HIS NECK. REFLUX ALL THE TIME. QUIT SMOKING 1.5 YRS AGO. GAINED SOME WEIGHT. OCCASIONAL PROBLEMS SWALLOWING SUPPLEMENTS. BMS; 2-3X/DAY. PT DENIES FEVER, CHILLS, nausea, vomiting, melena, diarrhea, constipation, OR abd pain.  Past Medical History  Diagnosis Date  . GERD (gastroesophageal reflux disease)     History reviewed. No pertinent past surgical history.  No Known Allergies  No current outpatient prescriptions on file.   No current facility-administered medications for this visit.    Family History  Problem Relation Age of Onset  . Colon cancer SISTER AGE 46   . Colon polyps Neg Hx     History  Substance Use Topics  . Smoking status: Former Smoker    Types: Cigarettes  . Smokeless tobacco: Not on file  . Alcohol Use: No               Review of Systems PER HPI OTHERWISE ALL SYSTEMS ARE NEGATIVE.     Objective:   Physical Exam  Vitals reviewed. Constitutional: He is oriented to person, place, and time. He appears well-nourished. No distress.  HENT:  Head: Normocephalic and atraumatic.  Mouth/Throat: Oropharynx is clear and moist. No oropharyngeal exudate.  Eyes: Pupils are equal, round, and reactive to light. No scleral icterus.  Neck: Normal range of motion. Neck supple.  Cardiovascular: Normal rate, regular rhythm and normal heart sounds.   Pulmonary/Chest: Effort normal and breath sounds normal. No respiratory distress.  Abdominal: Soft. Bowel sounds are normal. He exhibits no distension. There is no tenderness.  Musculoskeletal: He exhibits no edema.    Lymphadenopathy:    He has no cervical adenopathy.  Neurological: He is alert and oriented to person, place, and time.  NO FOCAL DEFICITS   Psychiatric: He has a normal mood and affect.          Assessment & Plan:   

## 2013-12-25 ENCOUNTER — Telehealth: Payer: Self-pay | Admitting: Gastroenterology

## 2013-12-25 ENCOUNTER — Other Ambulatory Visit: Payer: Self-pay | Admitting: Gastroenterology

## 2013-12-25 DIAGNOSIS — K648 Other hemorrhoids: Secondary | ICD-10-CM

## 2013-12-25 DIAGNOSIS — K625 Hemorrhage of anus and rectum: Secondary | ICD-10-CM

## 2013-12-25 DIAGNOSIS — C20 Malignant neoplasm of rectum: Secondary | ICD-10-CM

## 2013-12-25 HISTORY — DX: Malignant neoplasm of rectum: C20

## 2013-12-25 NOTE — Telephone Encounter (Signed)
LMOM for pt to call. Will fax lab orders to [facility].

## 2013-12-25 NOTE — Telephone Encounter (Signed)
Referral has been made to Dr. Ardis Hughs and Oncology

## 2013-12-25 NOTE — Telephone Encounter (Signed)
Called patient TO DISCUSS RESULTS. EXPLAINED RECTAL BX SHOW RECTAL CANCER. His stomach Bx shows gastritis. HIS ESOPHAGUS BIOPSIES SHOW YEAST IN HIS ESOPHAGUS. PT DENIES TROUBLE SWALLOWING. WE WILL TREAT THE YEAST IN HIS ESOPHAGUS IF HE HAS TROUBLE A SWALLOWING OR BEFORE HE STARTS CHEMOTHERAPY. STILL WAITING ON [facility].  PT NEEDS CBC, CMP, PT/INR. CXR PA/LAT, CEA, CT ABD/PELVIS W/ IVC AND ORAL CONTRAST. HE NEEDS A RECTAL U/S WITH DR. DAN JACOBS WITHIN THE NEXT 7-10 DAYS. APPT WITH ONCOLOGY WITHIN THE NEXT 14 DAYS. REPEAT TCS IN ONE YEAR.

## 2013-12-25 NOTE — Telephone Encounter (Signed)
Lab orders faxed to [facility].  Darius Bump, please tell pt to go to get these labs done.  Dr. Oneida Alar has already discussed with him. Thanks!

## 2013-12-26 ENCOUNTER — Other Ambulatory Visit (HOSPITAL_COMMUNITY): Payer: Self-pay | Admitting: Hematology and Oncology

## 2013-12-26 ENCOUNTER — Encounter (HOSPITAL_COMMUNITY): Payer: Self-pay | Admitting: Gastroenterology

## 2013-12-26 ENCOUNTER — Other Ambulatory Visit: Payer: Self-pay | Admitting: Gastroenterology

## 2013-12-26 DIAGNOSIS — C20 Malignant neoplasm of rectum: Secondary | ICD-10-CM

## 2013-12-26 DIAGNOSIS — R933 Abnormal findings on diagnostic imaging of other parts of digestive tract: Secondary | ICD-10-CM

## 2013-12-26 LAB — CBC WITH DIFFERENTIAL/PLATELET
Basophils Absolute: 0 10*3/uL (ref 0.0–0.1)
Basophils Relative: 0 % (ref 0–1)
EOS PCT: 2 % (ref 0–5)
Eosinophils Absolute: 0.1 10*3/uL (ref 0.0–0.7)
HEMATOCRIT: 43.8 % (ref 39.0–52.0)
Hemoglobin: 15.6 g/dL (ref 13.0–17.0)
Lymphocytes Relative: 23 % (ref 12–46)
Lymphs Abs: 1.6 10*3/uL (ref 0.7–4.0)
MCH: 31.7 pg (ref 26.0–34.0)
MCHC: 35.6 g/dL (ref 30.0–36.0)
MCV: 89 fL (ref 78.0–100.0)
Monocytes Absolute: 0.6 10*3/uL (ref 0.1–1.0)
Monocytes Relative: 9 % (ref 3–12)
Neutro Abs: 4.6 10*3/uL (ref 1.7–7.7)
Neutrophils Relative %: 66 % (ref 43–77)
PLATELETS: 306 10*3/uL (ref 150–400)
RBC: 4.92 MIL/uL (ref 4.22–5.81)
RDW: 13.2 % (ref 11.5–15.5)
WBC: 7 10*3/uL (ref 4.0–10.5)

## 2013-12-26 LAB — COMPLETE METABOLIC PANEL WITH GFR
ALBUMIN: 4 g/dL (ref 3.5–5.2)
ALT: 15 U/L (ref 0–53)
AST: 14 U/L (ref 0–37)
Alkaline Phosphatase: 76 U/L (ref 39–117)
BUN: 23 mg/dL (ref 6–23)
CHLORIDE: 105 meq/L (ref 96–112)
CO2: 26 mEq/L (ref 19–32)
CREATININE: 0.89 mg/dL (ref 0.50–1.35)
Calcium: 9.5 mg/dL (ref 8.4–10.5)
GFR, Est Non African American: >89 mL/min
Glucose, Bld: 88 mg/dL (ref 70–99)
Potassium: 4.3 mEq/L (ref 3.5–5.3)
Sodium: 139 mEq/L (ref 135–145)
TOTAL PROTEIN: 6.5 g/dL (ref 6.0–8.3)
Total Bilirubin: 0.6 mg/dL (ref 0.2–1.2)

## 2013-12-26 LAB — PROTIME-INR
INR: 0.99 (ref <1.50)
Prothrombin Time: 13 seconds (ref 11.6–15.2)

## 2013-12-26 NOTE — Telephone Encounter (Signed)
Reminder in epic °

## 2013-12-26 NOTE — Telephone Encounter (Signed)
Pt is aware to have labs done

## 2013-12-26 NOTE — Telephone Encounter (Signed)
Patient CT scan an Chest x-ray  is scheduled on Monday May 11th at 4:30 and he is aware

## 2013-12-27 LAB — CEA: CEA: 2.8 ng/mL (ref 0.0–5.0)

## 2013-12-28 NOTE — Telephone Encounter (Signed)
PLEASE CALL PT. HIS LAB TESTS ARE NORMAL. I WILL CALL HIM WHEN THE RESULTS OF HIS CT AND CXR ARE FINAL.

## 2013-12-29 ENCOUNTER — Telehealth: Payer: Self-pay

## 2013-12-29 ENCOUNTER — Other Ambulatory Visit: Payer: Self-pay

## 2013-12-29 ENCOUNTER — Ambulatory Visit (HOSPITAL_COMMUNITY)
Admission: RE | Admit: 2013-12-29 | Discharge: 2013-12-29 | Disposition: A | Discharge status: Home / Self Care | Payer: Self-pay | Source: Ambulatory Visit | Attending: Gastroenterology | Admitting: Gastroenterology

## 2013-12-29 ENCOUNTER — Telehealth (HOSPITAL_COMMUNITY): Payer: Self-pay | Admitting: Hematology and Oncology

## 2013-12-29 DIAGNOSIS — C20 Malignant neoplasm of rectum: Secondary | ICD-10-CM | POA: Insufficient documentation

## 2013-12-29 DIAGNOSIS — K5730 Diverticulosis of large intestine without perforation or abscess without bleeding: Secondary | ICD-10-CM | POA: Insufficient documentation

## 2013-12-29 MED ORDER — IOHEXOL 300 MG/ML  SOLN
100.0000 mL | Freq: Once | INTRAMUSCULAR | Status: AC | PRN
  Administered 2013-12-29: 100 mL via INTRAVENOUS

## 2013-12-29 NOTE — Telephone Encounter (Signed)
Left message on machine to call back  

## 2013-12-29 NOTE — Telephone Encounter (Signed)
Message copied by Barron Alvine on Mon Dec 29, 2013  8:16 AM ------      Message from: Milus Banister      Created: Mon Dec 29, 2013  7:16 AM      Regarding: RE: RECTAL U/S       Nikita Surman,      He needs lower EUS, this Thursday, moderate sedation work, for rectal cancer staging.  45 min, (start 8:30AM at [facility]).  Thanks            Fuller Song take care of this.                  ----- Message -----         From: Barron Alvine, CMA         Sent: 12/25/2013   4:24 PM           To: Milus Banister, MD      Subject: Melton Alar: RECTAL U/S                                                       ----- Message -----         From: Michelene Gardener Werth         Sent: 12/25/2013   4:23 PM           To: Barron Alvine, CMA      Subject: RECTAL U/S                                               Called patient TO DISCUSS RESULTS. EXPLAINED RECTAL BX SHOW RECTAL CANCER. His stomach Bx shows gastritis. HIS ESOPHAGUS BIOPSIES SHOW YEAST IN HIS ESOPHAGUS. PT DENIES TROUBLE SWALLOWING. WE WILL TREAT THE YEAST IN HIS ESOPHAGUS IF HE HAS TROUBLE A SWALLOWING OR BEFORE HE STARTS CHEMOTHERAPY. STILL WAITING ON [place] [facility]IST[facility].                  PT NEEDS CBC, CMP, PT/INR. CXR PA/LAT, CEA, CT ABD/PELVIS W/ IVC AND ORAL CONTRAST. HE NEEDS A RECTAL U/S WITH DR. DAN JACOBS WITHIN THE NEXT 7-10 DAYS. APPT WITH ONCOLOGY WITHIN THE NEXT 14 DAYS. REPEAT TCS IN ONE YEAR.               ------

## 2013-12-29 NOTE — Telephone Encounter (Signed)
Called and informed pt.  

## 2013-12-30 ENCOUNTER — Telehealth: Payer: Self-pay | Admitting: *Deleted

## 2013-12-30 NOTE — Telephone Encounter (Signed)
Pt called wanting to see if his results came back yet, I made pt aware it can take 7 business days to get results back and pt stated he had a colonoscopy done last Monday. Please advise [phone]<Cell or [phone]<pt's shop number per pt if he does not answer LMOM

## 2013-12-30 NOTE — Telephone Encounter (Signed)
EUS scheduled, pt instructed and medications reviewed.  Patient instructions mailed to home.  Patient to call with any questions or concerns.  

## 2013-12-30 NOTE — Telephone Encounter (Signed)
Routing to Dr. Fields for results.  

## 2014-01-01 ENCOUNTER — Ambulatory Visit (HOSPITAL_COMMUNITY)
Admission: RE | Admit: 2014-01-01 | Discharge: 2014-01-01 | Disposition: A | Discharge status: Home / Self Care | Payer: Self-pay | Source: Ambulatory Visit | Attending: Gastroenterology | Admitting: Gastroenterology

## 2014-01-01 ENCOUNTER — Encounter (HOSPITAL_COMMUNITY): Payer: Self-pay | Admitting: *Deleted

## 2014-01-01 ENCOUNTER — Encounter (HOSPITAL_COMMUNITY)
Admission: RE | Disposition: A | Discharge status: Home / Self Care | Payer: Self-pay | Source: Ambulatory Visit | Attending: Gastroenterology

## 2014-01-01 DIAGNOSIS — K219 Gastro-esophageal reflux disease without esophagitis: Secondary | ICD-10-CM | POA: Insufficient documentation

## 2014-01-01 DIAGNOSIS — M129 Arthropathy, unspecified: Secondary | ICD-10-CM | POA: Insufficient documentation

## 2014-01-01 DIAGNOSIS — Z87891 Personal history of nicotine dependence: Secondary | ICD-10-CM | POA: Insufficient documentation

## 2014-01-01 DIAGNOSIS — C20 Malignant neoplasm of rectum: Secondary | ICD-10-CM | POA: Insufficient documentation

## 2014-01-01 HISTORY — DX: Unspecified osteoarthritis, unspecified site: M19.90

## 2014-01-01 HISTORY — PX: EUS: SHX5427

## 2014-01-01 SURGERY — ULTRASOUND, LOWER GI TRACT, ENDOSCOPIC
Anesthesia: Moderate Sedation

## 2014-01-01 MED ORDER — DIPHENHYDRAMINE HCL 50 MG/ML IJ SOLN
INTRAMUSCULAR | Status: AC
  Filled 2014-01-01: qty 1

## 2014-01-01 MED ORDER — SPOT INK MARKER SYRINGE KIT
PACK | SUBMUCOSAL | Status: AC
  Filled 2014-01-01: qty 5

## 2014-01-01 MED ORDER — SPOT INK MARKER SYRINGE KIT
PACK | SUBMUCOSAL | Status: DC | PRN
  Administered 2014-01-01: 4 mL via SUBMUCOSAL

## 2014-01-01 MED ORDER — FENTANYL CITRATE 0.05 MG/ML IJ SOLN
INTRAMUSCULAR | Status: DC | PRN
  Administered 2014-01-01 (×2): 25 ug via INTRAVENOUS

## 2014-01-01 MED ORDER — MIDAZOLAM HCL 10 MG/2ML IJ SOLN
INTRAMUSCULAR | Status: AC
  Filled 2014-01-01: qty 2

## 2014-01-01 MED ORDER — MIDAZOLAM HCL 10 MG/2ML IJ SOLN
INTRAMUSCULAR | Status: AC
  Filled 2014-01-01: qty 4

## 2014-01-01 MED ORDER — SODIUM CHLORIDE 0.9 % IV SOLN
INTRAVENOUS | Status: DC
  Administered 2014-01-01: 500 mL via INTRAVENOUS

## 2014-01-01 MED ORDER — FENTANYL CITRATE 0.05 MG/ML IJ SOLN
INTRAMUSCULAR | Status: AC
  Filled 2014-01-01: qty 2

## 2014-01-01 MED ORDER — MIDAZOLAM HCL 10 MG/2ML IJ SOLN
INTRAMUSCULAR | Status: DC | PRN
Start: 2014-01-01 — End: 2014-01-01
  Administered 2014-01-01 (×2): 2 mg via INTRAVENOUS

## 2014-01-01 NOTE — Op Note (Signed)
[facility] [place], [address]   ENDOSCOPIC ULTRASOUND PROCEDURE REPORT  PATIENT: [name], [name]  MR#: [id number] BIRTHDATE: [birth year]  GENDER: Male ENDOSCOPIST: Milus Banister, MD REFERRED BY:  Barney Drain, M.D. PROCEDURE DATE:  01/01/2014 PROCEDURE:   Lower EUS, flex sigmoidoscopy with submucosal injection  ASA CLASS:      Class II INDICATIONS:   recently diagnosed rectal adenocarcinoma; no metastatic disease on a/p CT scan. MEDICATIONS: Fentanyl 50 mcg IV and Versed 4 mg IV  DESCRIPTION OF PROCEDURE:   After the risks benefits and alternatives of the procedure were  explained, informed consent was obtained. The patient was then placed in the left, lateral, decubitus postion and IV sedation was administered. Throughout the procedure, the patients blood pressure, pulse and oxygen saturations were monitored continuously.  Under direct visualization, the Pentax Radial EUS [id number]  endoscope was introduced through the anus  and advanced to the sigmoid colon . Water was used as necessary to provide an acoustic interface.  Upon completion of the imaging, water was removed and the patient was sent to the recovery room in satisfactory condition.   Sigmoidoscopic findings: 1. Raised, round mass in distal rectum. The mass was 2cm across, distal edge was 3-4cm from the anal verge. The lateral edges of the mass were labled with injection of [place] Ink.  EUS findings: 1. The mass above correlated with a hypoechoic left anterior mass on EUS that was 1.7cm across, appeared to focally involve the muscularis proprial layer of the rectal wall but did not invade through the layer (uT2) 2. There was no perirectal adenopathy (uN0)  Impression: 1.7cm uT2N0 rectal adenocarcinoma that lays along left/anterior wall of distal rectum, the distal edge of the mass is 3-4cm from the anal verge.  Appears to be appropriate for up front  resection.   _______________________________ eSignedMilus Banister, MD 01/01/2014 9:25 AM

## 2014-01-01 NOTE — Discharge Instructions (Signed)
YOU HAD AN ENDOSCOPIC PROCEDURE TODAY: Refer to the procedure report that was given to you for any specific questions about what was found during the examination.  If the procedure report does not answer your questions, please call your gastroenterologist to clarify.  YOU SHOULD EXPECT: Some feelings of bloating in the abdomen. Passage of more gas than usual.  Walking can help get rid of the air that was put into your GI tract during the procedure and reduce the bloating. If you had a lower endoscopy (such as a colonoscopy or flexible sigmoidoscopy) you may notice spotting of blood in your stool or on the toilet paper.   DIET: Your first meal following the procedure should be a light meal and then it is ok to progress to your normal diet.  A half-sandwich or bowl of soup is an example of a good first meal.  Heavy or fried foods are harder to digest and may make you feel nasueas or bloated.  Drink plenty of fluids but you should avoid alcoholic beverages for 24 hours.  ACTIVITY: Your care partner should take you home directly after the procedure.  You should plan to take it easy, moving slowly for the rest of the day.  You can resume normal activity the day after the procedure however you should NOT DRIVE or use heavy machinery for 24 hours (because of the sedation medicines used during the test).    SYMPTOMS TO REPORT IMMEDIATELY  A gastroenterologist can be reached at any hour.  Please call your doctor's office for any of the following symptoms:   Following lower endoscopy (colonoscopy, flexible sigmoidoscopy)  Excessive amounts of blood in the stool  Significant tenderness, worsening of abdominal pains  Swelling of the abdomen that is new, acute  Fever of 100 or higher  Following upper endoscopy (EGD, EUS, ERCP)  Vomiting of blood or coffee ground material  New, significant abdominal pain  New, significant chest pain or pain under the shoulder blades  Painful or persistently difficult  swallowing  New shortness of breath  Black, tarry-looking stools  FOLLOW UP: If any biopsies were taken you will be contacted by phone or by letter within the next 1-3 weeks.  Call your gastroenterologist if you have not heard about the biopsies in 3 weeks.  Please also call your gastroenterologist's office with any specific questions about appointments or follow up tests.  Flexible Sigmoidoscopy Your caregiver has ordered a flexible sigmoidoscopy. This is an exam to evaluate your lower colon. In this exam your colon is cleansed and a short fiber optic tube is inserted through your rectum and into your colon. The fiber optic scope (endoscope) is a short bundle of enclosed flexible small glass fibers. It transmits light to the area examined and images from that area to your caregiver. You do not have to worry about glass breakage in the endoscope. Discomfort is usually minimal. Sedatives and pain medications are generally not required. This exam helps to detect tumors (lumps), polyps, inflammation (swelling and soreness), and areas of bleeding. It may also be used to take biopsies. These are small pieces of tissue taken to examine under a microscope. LET YOUR CAREGIVER KNOW ABOUT:  Allergies.  Medications taken including herbs, eye drops, over the counter medications, and creams.  Use of steroids (by mouth or creams).  Previous problems with anesthetics or novocaine  Possibility of pregnancy, if this applies.  History of blood clots (thrombophlebitis).  History of bleeding or blood problems.  Previous surgery.  Other  health problems. BEFORE THE PROCEDURE Eat normally the night before the exam. Your caregiver may order a mild enema or laxative the night before. No eating or drinking should occur after midnight until the procedure is completed. A rectal suppository or enemas may be given in the morning prior to your procedure. You will be brought to the examination area in a hospital  gown. You should be present 60 minutes prior to your procedure or as directed.  AFTER THE PROCEDURE   There is sometimes a little blood passed with the first bowel movement. Do not be concerned. Because air is often used during the exam, it is not unusual to pass gas and experience abdominal (belly) cramping. Walking or a warm pack on your abdomen may help with this. Do not sleep with a heating pad as burns can occur.  You may resume all normal eating and activities.  Only take over-the-counter or prescription medicines for pain, discomfort, or fever as directed by your caregiver. Do not use aspirin or blood thinners if a biopsy (tissue sample) was taken. Consult your caregiver for medication usage if biopsies were taken.  Call for your results as instructed by your caregiver. Remember, it is your responsibility to obtain the results of your biopsy. Do not assume everything is fine because you do not hear from your caregiver. SEEK IMMEDIATE MEDICAL CARE IF:  An oral temperature above 102 F (38.9 C) develops.  You pass large blood clots or fill a toilet with blood following the procedure. This may also occur 10 to 14 days following the procedure. It is more likely if a biopsy was taken.  You develop abdominal pain not relieved with medication or that is getting worse rather than better. Document Released: 08/04/2000 Document Revised: 10/30/2011 Document Reviewed: 05/17/2005 [facility] Patient Information 2014 [place].

## 2014-01-01 NOTE — H&P (Signed)
  HPI: This is a man found to have rectal cancer, no sign of metastatic disease on imaging    Past Medical History  Diagnosis Date  . GERD (gastroesophageal reflux disease)   . Shortness of breath     due to weight gain poststopping smokeing  . Arthritis     Past Surgical History  Procedure Laterality Date  . No past surgeries    . Colonoscopy N/A 12/22/2013    Procedure: COLONOSCOPY;  Surgeon: Danie Binder, MD;  Location: [facility];  Service: Endoscopy;  Laterality: N/A;  8:30  . Esophagogastroduodenoscopy N/A 12/22/2013    Procedure: ESOPHAGOGASTRODUODENOSCOPY (EGD);  Surgeon: Danie Binder, MD;  Location: [facility];  Service: Endoscopy;  Laterality: N/A;    Current Facility-Administered Medications  Medication Dose Route Frequency Provider Last Rate Last Dose  . 0.9 %  sodium chloride infusion   Intravenous Continuous Milus Banister, MD        Allergies as of 12/29/2013  . (No Known Allergies)    Family History  Problem Relation Age of Onset  . Colon polyps Neg Hx   . Colon cancer Sister     History   Social History  . Marital Status: Married    Spouse Name: N/A    Number of Children: N/A  . Years of Education: N/A   Occupational History  . Not on file.   Social History Main Topics  . Smoking status: Former Smoker -- 0.00 packs/day for 15 years    Types: Cigars  . Smokeless tobacco: Not on file  . Alcohol Use: No  . Drug Use: No  . Sexual Activity: Not on file   Other Topics Concern  . 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      Physical Exam: BP 151/106  Pulse 77  Temp(Src) 98.3 F (36.8 C) (Oral)  Resp 12  SpO2 98% Constitutional: generally well-appearing Psychiatric: alert and oriented x3 Abdomen: soft, nontender, nondistended, no obvious ascites, no peritoneal signs, normal bowel sounds     Assessment and plan: 58 y.o. male with rectal cancer  For EUS staging today

## 2014-01-02 ENCOUNTER — Encounter (HOSPITAL_COMMUNITY): Payer: Self-pay | Admitting: Gastroenterology

## 2014-01-02 NOTE — Telephone Encounter (Addendum)
Pt is aware of results. He would like to go to [facility] in [place]. Can we go ahead and send the referral to them.

## 2014-01-02 NOTE — Telephone Encounter (Signed)
PLEASE CALL PT. HIS CXR AND CT SHOW NO EVIDENCE THAT HIS CANCER HAS SPREAD OUTSIDE OF HIS RECTUM. HIS EUS SHOWS A LESION THAT DOES NOT EXTEND THROUGH THE RECTAL WALL. HE NEEDS TO SEE A SURGEON. HE SHOULD LET us KNOW IF HE WANTS TO SEE ONE IN [place], [place], OR [place].

## 2014-01-02 NOTE — Telephone Encounter (Signed)
LMOM to call back

## 2014-01-02 NOTE — Telephone Encounter (Signed)
REFER PT TO DR. Lucia Gaskins. PLEASE CALL PT AND LET HIM KNOW THE REFERRAL HAS BEEN MADE.

## 2014-01-05 ENCOUNTER — Other Ambulatory Visit: Payer: Self-pay | Admitting: Gastroenterology

## 2014-01-05 DIAGNOSIS — C20 Malignant neoplasm of rectum: Secondary | ICD-10-CM

## 2014-01-05 NOTE — Telephone Encounter (Signed)
Referral has been made to Dr. Lucia Gaskins

## 2014-01-09 ENCOUNTER — Encounter (HOSPITAL_COMMUNITY)
Admission: RE | Admit: 2014-01-09 | Discharge: 2014-01-09 | Disposition: A | Discharge status: Home / Self Care | Payer: MEDICAID | Source: Ambulatory Visit | Attending: Hematology and Oncology | Admitting: Hematology and Oncology

## 2014-01-09 ENCOUNTER — Ambulatory Visit (HOSPITAL_COMMUNITY): Payer: Self-pay

## 2014-01-09 ENCOUNTER — Encounter (HOSPITAL_COMMUNITY): Payer: Self-pay

## 2014-01-09 DIAGNOSIS — C20 Malignant neoplasm of rectum: Secondary | ICD-10-CM

## 2014-01-09 LAB — GLUCOSE, CAPILLARY: Glucose-Capillary: 91 mg/dL (ref 70–99)

## 2014-01-09 MED ORDER — FLUDEOXYGLUCOSE F - 18 (FDG) INJECTION
8.7000 | Freq: Once | INTRAVENOUS | Status: AC | PRN
  Administered 2014-01-09: 8.7 via INTRAVENOUS

## 2014-01-15 ENCOUNTER — Ambulatory Visit (INDEPENDENT_AMBULATORY_CARE_PROVIDER_SITE_OTHER): Payer: Self-pay | Admitting: Surgery

## 2014-01-15 ENCOUNTER — Encounter (INDEPENDENT_AMBULATORY_CARE_PROVIDER_SITE_OTHER): Payer: Self-pay | Admitting: Surgery

## 2014-01-15 ENCOUNTER — Encounter (HOSPITAL_COMMUNITY): Payer: Self-pay

## 2014-01-15 ENCOUNTER — Encounter (HOSPITAL_COMMUNITY): Payer: Self-pay | Attending: Hematology and Oncology

## 2014-01-15 VITALS — BP 114/88 | HR 85 | Temp 98.5°F | Resp 18 | Wt 178.5 lb

## 2014-01-15 VITALS — BP 140/82 | HR 80 | Temp 97.5°F | Ht 65.0 in | Wt 179.0 lb

## 2014-01-15 DIAGNOSIS — C20 Malignant neoplasm of rectum: Secondary | ICD-10-CM | POA: Insufficient documentation

## 2014-01-15 DIAGNOSIS — Z87891 Personal history of nicotine dependence: Secondary | ICD-10-CM | POA: Insufficient documentation

## 2014-01-15 DIAGNOSIS — M129 Arthropathy, unspecified: Secondary | ICD-10-CM | POA: Insufficient documentation

## 2014-01-15 DIAGNOSIS — L578 Other skin changes due to chronic exposure to nonionizing radiation: Secondary | ICD-10-CM | POA: Insufficient documentation

## 2014-01-15 DIAGNOSIS — R0602 Shortness of breath: Secondary | ICD-10-CM | POA: Insufficient documentation

## 2014-01-15 DIAGNOSIS — K219 Gastro-esophageal reflux disease without esophagitis: Secondary | ICD-10-CM | POA: Insufficient documentation

## 2014-01-15 DIAGNOSIS — Z800 Family history of malignant neoplasm of digestive organs: Secondary | ICD-10-CM | POA: Insufficient documentation

## 2014-01-15 LAB — CBC WITH DIFFERENTIAL/PLATELET
Basophils Absolute: 0 10*3/uL (ref 0.0–0.1)
Basophils Relative: 0 % (ref 0–1)
EOS PCT: 1 % (ref 0–5)
Eosinophils Absolute: 0.1 10*3/uL (ref 0.0–0.7)
HEMATOCRIT: 43.4 % (ref 39.0–52.0)
Hemoglobin: 14.6 g/dL (ref 13.0–17.0)
LYMPHS ABS: 1.5 10*3/uL (ref 0.7–4.0)
LYMPHS PCT: 21 % (ref 12–46)
MCH: 30.4 pg (ref 26.0–34.0)
MCHC: 33.6 g/dL (ref 30.0–36.0)
MCV: 90.4 fL (ref 78.0–100.0)
MONOS PCT: 6 % (ref 3–12)
Monocytes Absolute: 0.4 10*3/uL (ref 0.1–1.0)
Neutro Abs: 4.9 10*3/uL (ref 1.7–7.7)
Neutrophils Relative %: 72 % (ref 43–77)
Platelets: 319 10*3/uL (ref 150–400)
RBC: 4.8 MIL/uL (ref 4.22–5.81)
RDW: 13 % (ref 11.5–15.5)
WBC: 7 10*3/uL (ref 4.0–10.5)

## 2014-01-15 LAB — COMPREHENSIVE METABOLIC PANEL
ALT: 16 U/L (ref 0–53)
AST: 15 U/L (ref 0–37)
Albumin: 3.4 g/dL — ABNORMAL LOW (ref 3.5–5.2)
Alkaline Phosphatase: 85 U/L (ref 39–117)
BUN: 17 mg/dL (ref 6–23)
CO2: 23 meq/L (ref 19–32)
CREATININE: 0.95 mg/dL (ref 0.50–1.35)
Calcium: 9.3 mg/dL (ref 8.4–10.5)
Chloride: 103 mEq/L (ref 96–112)
GLUCOSE: 110 mg/dL — AB (ref 70–99)
Potassium: 4.2 mEq/L (ref 3.7–5.3)
Sodium: 141 mEq/L (ref 137–147)
Total Bilirubin: 0.6 mg/dL (ref 0.3–1.2)
Total Protein: 6.8 g/dL (ref 6.0–8.3)

## 2014-01-15 LAB — FERRITIN: Ferritin: 60 ng/mL (ref 22–322)

## 2014-01-15 MED ORDER — METRONIDAZOLE 500 MG PO TABS: 500.0000 mg | ORAL_TABLET | ORAL | Status: DC

## 2014-01-15 MED ORDER — NEOMYCIN SULFATE 500 MG PO TABS: 1000.0000 mg | ORAL_TABLET | ORAL | Status: DC

## 2014-01-15 NOTE — Patient Instructions (Signed)
Please consider the recommendations that we have given you today:  Consider surgery to remove the very low rectal cancer.  Expect to need for a diverting loop ileostomy the first 3-12 months.  We will work hard to prevent a permanent colostomy.  See the Handout(s) we have given you.  Please call our office at [phone] if you wish to schedule surgery or if you have further questions / concerns.   Colorectal Cancer Colorectal cancer is an abnormal growth of tissue (tumor) in the colon or rectum that is cancerous (malignant). Unlike noncancerous (benign) tumors, malignant tumors can spread to other parts of your body. The colon is the large bowel or large intestine. The rectum is the last several inches of the colon.  RISK FACTORS The exact cause of colorectal cancer is unknown. However, the following factors may increase your chances of getting colorectal cancer:   Age older than 77 years.   Abnormal growths (polyps) on the inner wall of the colon or rectum.   Diabetes.   African American race.   Family history of hereditary nonpolyposis colorectal cancer. This condition is caused by changes in the genes that are responsible for repairing mismatched DNA.   Personal history of cancer. A person who has already had colorectal cancer may develop it a second time. Also, women with a history of ovarian, uterine, or breast cancer are at a somewhat higher risk of developing colorectal cancer.  Certain hereditary conditions.  Eating a diet that is high in fat (especially animal fat) and low in fiber, fruits, and vegetables.  Sedentary lifestyle.  Inflammatory bowel disease, including ulcerative colitis and Crohn disease.   Smoking.   Excessive alcohol use.  SYMPTOMS Early colorectal cancer often does not cause symptoms. As the cancer grows, symptoms may include:   Changes in bowel habits.  Diarrhea.   Constipation.   Feeling like the bowel does not empty completely  after a bowel movement.   Blood in the stool.   Stools that are narrower than usual.   Abdominal discomfort, pain, bloating, fullness, or cramps.  Frequent gas pain.   Unexplained weight loss.   Constant tiredness.   Nausea and vomiting.  DIAGNOSIS  Your health care provider will ask about your medical history. He or she may also perform a number of procedures, such as:   A physical exam.  A digital rectal exam.  A fecal occult blood test.  A barium enema.  Blood tests.   X-rays.   Imaging tests, such as CT scans or MRIs.   Taking a tissue sample (biopsy) from your colon or rectum to look for cancer cells.   A sigmoidoscopy to view the inside of the last part of your colon.   A colonoscopy to view the inside of your entire colon.   An endorectal ultrasound to see how deep a rectal tumor has grown and whether the cancer has spread to lymph nodes or other nearby tissues.  Your cancer will be staged to determine its severity and extent. Staging is a careful attempt to find out the size of the tumor, whether the cancer has spread, and if so, to what parts of the body. You may need to have more tests to determine the stage of your cancer. The test results will help determine what treatment plan is best for you.   Stage 0 The cancer is found only in the innermost lining of the colon or rectum.   Stage I The cancer has grown into the  inner wall of the colon or rectum. The cancer has not yet reached the outer wall of the colon.   Stage II The cancer extends more deeply into or through the wall of the colon or rectum. It may have invaded nearby tissue, but cancer cells have not spread to the lymph nodes.   Stage III The cancer has spread to nearby lymph nodes but not to other parts of the body.   Stage IV The cancer has spread to other parts of the body, such as the liver or lungs.  Your health care provider may tell you the detailed stage of your cancer,  which includes both a number and a letter.  TREATMENT  Depending on the type and stage, colorectal cancer may be treated with surgery, radiation therapy, chemotherapy, targeted therapy, or radiofrequency ablation. Some people have a combination of these therapies. Surgery may be done to remove the polyps from your colon. In early stages, your health care provider may be able to do this during a colonoscopy. In later stages, surgery may be done to remove part of your colon.  HOME CARE INSTRUCTIONS   Only take over-the-counter or prescription medicines for pain, discomfort, or fever as directed by your health care provider.   Maintain a healthy diet.   Consider joining a support group. This may help you learn to cope with the stress of having colorectal cancer.   Seek advice to help you manage treatment of side effects.   Keep all follow-up appointments as directed by your health care provider.   Inform your cancer specialist if you are admitted to the hospital.  SEEK MEDICAL CARE IF:  Your diarrhea or constipation does not go away.   Your bowel habits change.  You have increased abdominal pain.   You notice new fatigue or weakness.  You lose weight. Document Released: 08/07/2005 Document Revised: 04/09/2013 Document Reviewed: 01/30/2013 [facility] Patient Information 2014 [place].   COLON PREP INSTRUCTIONS for lower/distal colectomy:   Obtain what you need at a pharmacy of your choice:      Prescriptions for your oral antibiotics (Neomycin & Metronidazole)     A bottle of Milk of Magnesia    2 Fleet enemas (generic form OK to use)   DAY PRIOR TO SURGERY:    1:00pm    o Take 2 oz (4 tablespoons) Milk of Magnesia. o Take 2 Neomycin 576m tablets & 2 Metronidazole 5086mtablets     3:00pm:    o Take 2 Neomycin 50073mablets & 2 Metronidazole 500m65mblets     Bedtime (~10:00pm)  o Take 2 Neomycin 500mg57mlets & 2 Metronidazole 500mg 1mets      Midnight:  Do not eat or drink anything after midnight the night before your surgery.   MORNING OF PROCEDURE:    Remember to not to drink or eat anything that morning    Upon waking up, take the 2 Fleet enemas.  o Use at least 1 hour before leaving house o Try to retain each enema for 5-10 minutes before expelling it.  This should clean your lower colon sufficiently   If you have questions or problems, please call [email or web address] speak to someone in the clinic department at our office    Ostomy Support Information  Yes, you've heard that people get along just fine with only one of their eyes, or one of their lungs, or one of their kidneys. But you also know that you have only one intestine  and only one bladder, and that leaves you feeling awfully empty, both physically and emotionally: You think no other people go around without part of their intestine with the ends of their intestines sticking out through their abdominal walls.  Well, you are wrong! There are nearly three quarters of a million people in the [place] who have an ostomy; people who have had surgery to remove all or part of their colons or bladders. There is even a national association, the [place] [employer] [place] with over 350 local affiliated support groups that are organized by volunteers who provide peer support and counseling. [employer] has a toll free telephone num-ber, [phone] and an educational,  interactive website, www.ostomy.org   An ostomy is an opening in the belly (abdominal wall) made by surgery. Ostomates are people who have had this procedure. The opening (stoma) allows the kidney or bowel to discharge waste. An external pouch covers the stoma to collect waste. Pouches are are a simple bag and are odor free. Different companies have disposable or reusable pouches to fit one's lifestyle. An ostomy can either be temporary or permanent.  THERE ARE THREE MAIN TYPES OF  OSTOMIES  Colostomy. A colostomy is a surgically created opening in the large intestine (colon).  Ileostomy. An ileostomy is a surgically created opening in the small intestine.  Urostomy. A urostomy is a surgically created opening to divert urine away from the bladder. FREQUENTLY ASKED QUESTIONS   Why haven't you met any of these folks who have an ostomy?  Well, maybe you have! You just did not recognize them because an ostomy doesn't show. It can be kept secret if you wish. Why, maybe some of your best friends, office associates or neighbors have an ostomy ... you never can tell.   People facing ostomy surgery have many quality-of-life questions like:  Will you bulge? Smell? Make noises? Will you feel waste leaving your body? Will you be a captive of the toilet? Will you starve? Be a social outcast? Get/stay married? Have babies? Easily bathe, go swimming, bend over?  OK, let's look at what you can expect:  Will you bulge?  Remember, without part of the intestine or bladder, and its contents, you should have a flatter tummy than before. You can expect to wear, with little exception, what you wore before surgery ... and this in-cludes tight clothing and bathing suits.  Will you smell?  Today, thanks to modern odor proof pouching systems, you can walk into an ostomy support group meeting and not smell anything that is foul or offensive. And, for those with an ileostomy or colostomy who are concerned about odor when emptying their pouch, there are in-pouch deodorants that can be used to eliminate any waste odors that may exist.  Will you make noises?  Everyone produces gas, especially if they are an air-swallower. But intestinal sounds that occur from time to time are no differ-ent than a gurgling tummy, and quite often your clothing will muffle any sounds.   Will you feel the waste discharges?  For those with a colostomy or ileostomy there might be a slight pressure when waste leaves your  body, but understand that the intestines have no nerve endings, so there will be no unpleasant sensations. Those with a urostomy will probably be unaware of any kidney drainage.  Will you be a captive of the toilet?  Immediately post-op you will spend more time in the bathroom than you will after your body recovers from surgery. Every person  is different, but on average those with an ileostomy or urostomy may empty their pouches 4 to 6 times a day; a little  less if you have a colostomy. The average wear time between pouch system changes is 3 to 5 days and the changing process should take less than 30 minutes.  Will I need to be on a special diet? Most people return to their normal diet when they have recovered from surgery. Be sure to chew your food well, eat a well-balanced diet and drink plenty of fluids. If you experience problems with a certain food, wait a couple of weeks and try it again. Will there be odor and noises? Pouching systems are designed to be odor-proof or odor-resistant. There are deodorants that can be used in the pouch. Medications are also available to help reduce odor. Limit gas-producing foods and carbonated beverages. You will experience less gas and fewer noises as you heal from surgery. How much time will it take to care for my ostomy? At first, you may spend a lot of time learning about your ostomy and how to take care of it. As you become more comfortable and skilled at changing the pouching system, it will take very little time to care for it.  Will I be able to return to work? People with ostomies can perform most jobs. As soon as you have healed from surgery, you should be able to return to work. Heavy lifting (more than 10 pounds) may be discouraged.  What about intimacy? Sexual relationships and intimacy are important and fulfilling aspects of your life. They should continue after ostomy surgery. Intimacy-related concerns should be discussed openly between you and your  partner.  Can I wear regular clothing? You do not need to wear special clothing. Ostomy pouches are fairly flat and barely noticeable. Elastic undergarments will not hurt the stoma or prevent the ostomy from functioning.  Can I participate in sports? An ostomy should not limit your involvement in sports. Many people with ostomies are runners, skiers, swimmers or participate in other active lifestyles. Talk with your caregiver first before doing heavy physical activity.  Will you starve?  Not if you follow doctor's orders at each stage of your post-op adjustment. There is no such thing as an "ostomy diet". Some people with an ostomy will be able to eat and tolerate anything; others may find diffi-culty with some foods. Each person is an individual and must determine, by trial, what is best for them. A good practice for all is to drink plenty of water.  Will you be a social outcast?  Have you met anyone who has an ostomy and is a social outcast? Why should you be the first? Only your attitude and self image will effect how you are treated. No confi-dent person is an Occupational psychologist.   PROFESSIONAL HELP  Resources are available if you need help or have questions about your ostomy.    Specially trained nurses called Wound, Ostomy Continence Nurses (WOCN) are available for consultation in most major medical centers.   Consider getting an ostomy consult with Cena Benton at [facility] to help troubleshoot stoma pouch fittings and other issues with your ostomy: [phone]   The [employer] Ost[employer] Association (UOA) is a group made up of many local chapters throughout the [place]. These local groups hold meetings and provide support to prospective and existing ostomates. They sponsor educational events and have qualified visitors to make personal or telephone visits. Contact the UOA for the chapter  nearest you and for other educational publications.  More detailed information can be found in  Colostomy Guide, a publication of the [employer] (UOA). Contact UOA at 1-316-686-4286 or visit their web site at https://arellano.com/. The website contains links to other sites, suppliers and resources. Document Released: 08/10/2003 Document Revised: 10/30/2011 Document Reviewed: 12/09/2008 [facility] Patient Information 2013 [place].  GETTING TO GOOD BOWEL HEALTH. Irregular bowel habits such as constipation and diarrhea can lead to many problems over time.  Having one soft bowel movement a day is the most important way to prevent further problems.  The anorectal canal is designed to handle stretching and feces to safely manage our ability to get rid of solid waste (feces, poop, stool) out of our body.  BUT, hard constipated stools can act like ripping concrete bricks and diarrhea can be a burning fire to this very sensitive area of our body, causing inflamed hemorrhoids, anal fissures, increasing risk is perirectal abscesses, abdominal pain/bloating, an making irritable bowel worse.     The goal: ONE SOFT BOWEL MOVEMENT A DAY!  To have soft, regular bowel movements:    Drink at least 8 tall glasses of water a day.     Take plenty of fiber.  Fiber is the undigested part of plant food that passes into the colon, acting s "natures broom" to encourage bowel motility and movement.  Fiber can absorb and hold large amounts of water. This results in a larger, bulkier stool, which is soft and easier to pass. Work gradually over several weeks up to 6 servings a day of fiber (25g a day even more if needed) in the form of: o Vegetables -- Root (potatoes, carrots, turnips), leafy green (lettuce, salad greens, celery, spinach), or cooked high residue (cabbage, broccoli, etc) o Fruit -- Fresh (unpeeled skin & pulp), Dried (prunes, apricots, cherries, etc ),  or stewed ( applesauce)  o Whole grain breads, pasta, etc (whole wheat)  o Bran cereals    Bulking Agents -- This type of water-retaining fiber  generally is easily obtained each day by one of the following:  o Psyllium bran -- The psyllium plant is remarkable because its ground seeds can retain so much water. This product is available as Metamucil, Konsyl, Effersyllium, Per Diem Fiber, or the less expensive generic preparation in drug and health food stores. Although labeled a laxative, it really is not a laxative.  o Methylcellulose -- This is another fiber derived from wood which also retains water. It is available as Citrucel. o Polyethylene Glycol - and "artificial" fiber commonly called Miralax or Glycolax.  It is helpful for people with gassy or bloated feelings with regular fiber o Flax Seed - a less gassy fiber than psyllium   No reading or other relaxing activity while on the toilet. If bowel movements take longer than 5 minutes, you are too constipated   AVOID CONSTIPATION.  High fiber and water intake usually takes care of this.  Sometimes a laxative is needed to stimulate more frequent bowel movements, but    Laxatives are not a good long-term solution as it can wear the colon out. o Osmotics (Milk of Magnesia, Fleets phosphosoda, Magnesium citrate, MiraLax, GoLytely) are safer than  o Stimulants (Senokot, Castor Oil, Dulcolax, Ex Lax)    o Do not take laxatives for more than 7days in a row.    IF SEVERELY CONSTIPATED, try a Bowel Retraining Program: o Do not use laxatives.  o Eat a diet high in roughage, such  as bran cereals and leafy vegetables.  o Drink six (6) ounces of prune or apricot juice each morning.  o Eat two (2) large servings of stewed fruit each day.  o Take one (1) heaping tablespoon of a psyllium-based bulking agent twice a day. Use sugar-free sweetener when possible to avoid excessive calories.  o Eat a normal breakfast.  o Set aside 15 minutes after breakfast to sit on the toilet, but do not strain to have a bowel movement.  o If you do not have a bowel movement by the third day, use an enema and repeat the  above steps.    Controlling diarrhea o Switch to liquids and simpler foods for a few days to avoid stressing your intestines further. o Avoid dairy products (especially milk & ice cream) for a short time.  The intestines often can lose the ability to digest lactose when stressed. o Avoid foods that cause gassiness or bloating.  Typical foods include beans and other legumes, cabbage, broccoli, and dairy foods.  Every person has some sensitivity to other foods, so listen to our body and avoid those foods that trigger problems for you. o Adding fiber (Citrucel, Metamucil, psyllium, Miralax) gradually can help thicken stools by absorbing excess fluid and retrain the intestines to act more normally.  Slowly increase the dose over a few weeks.  Too much fiber too soon can backfire and cause cramping & bloating. o Probiotics (such as active yogurt, Align, etc) may help repopulate the intestines and colon with normal bacteria and calm down a sensitive digestive tract.  Most studies show it to be of mild help, though, and such products can be costly. o Medicines:   Bismuth subsalicylate (ex. Kayopectate, Pepto Bismol) every 30 minutes for up to 6 doses can help control diarrhea.  Avoid if pregnant.   Loperamide (Immodium) can slow down diarrhea.  Start with two tablets (85m total) first and then try one tablet every 6 hours.  Avoid if you are having fevers or severe pain.  If you are not better or start feeling worse, stop all medicines and call your doctor for advice o Call your doctor if you are getting worse or not better.  Sometimes further testing (cultures, endoscopy, X-ray studies, bloodwork, etc) may be needed to help diagnose and treat the cause of the diarrhea. o

## 2014-01-15 NOTE — Progress Notes (Signed)
Subjective:     Patient ID: [name], male   DOB: 05-09-1956, 58 y.o.   MRN: [id number]  HPI  Note: Portions of this report may have been transcribed using voice recognition software. Every effort was made to ensure accuracy; however, inadvertent computerized transcription errors may be present.   Any transcriptional errors that result from this process are unintentional.            AVEN ESSA  08-20-1956 [id number]  Patient Care Team: [name], MD as PCP - General (Family Medicine) Satira Sark, MD as Consulting Physician (Cardiology) Danie Binder, MD as Consulting Physician (Gastroenterology) Milus Banister, MD as Attending Physician (Gastroenterology) Shann Medal, MD as Consulting Physician (General Surgery) Farrel Gobble, MD as Consulting Physician (Medical Oncology) Adin Hector, MD as Consulting Physician (General Surgery) Fay Records, MD as Consulting Physician (Cardiology)  This patient is a 58 y.o.male who presents today for surgical evaluation at the request of Dr. Lucia Gaskins.   Reason for visit: New rectal cancer.  Pleasant active male.  Had a sister died of colon cancer age 75.  Had episode of rectal bleeding and pain.  Underwent colonoscopy.  Cannot have mass in the distal rectum.  Biopsy concerning for adenocarcinoma.  Endoscopic ultrasound stages as T2N0.  Surgical consultation requested.  He should wish to have surgery in [place].  Because of my experience in low rectal cancers, Dr. Lucia Gaskins requested a second opinion in office concerning this patient.  Patient denies any incontinence to flatus or stool.  Normally as I saw about one a day.  No personal nor family history of inflammatory bowel disease, irritable bowel syndrome, allergy such as Celiac Sprue, dietary/dairy problems, colitis, ulcers nor gastritis.  No recent sick contacts/gastroenteritis.  No travel outside the country.  No changes in diet.  No dysphagia to solids or  liquids.  No significant heartburn or reflux.  No hematemesis, coffee ground emesis.  No evidence of prior gastric/peptic ulceration.  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  Very active.  He cannot bend his left knee much.  It tends to be straight.  Has not smoked tobacco in since last year.  Had episode of atypical chest pain.  Negative cardiac work up including normal Myoview 2 months ago.  Patient Active Problem List   Diagnosis Date Noted  . Rectal cancer, uT2uN0, posterior 4cm from anal verge 12/25/2013  . Hemorrhoids, internal 12/25/2013  . GERD (gastroesophageal reflux disease) 11/27/2013  . Atypical chest pain 11/10/2013  . Tobacco abuse, in remission 11/10/2013    Past Medical History  Diagnosis Date  . GERD (gastroesophageal reflux disease)   . Shortness of breath     due to weight gain poststopping smokeing  . Arthritis     Past Surgical History  Procedure Laterality Date  . No past surgeries    . Colonoscopy N/A 12/22/2013    Procedure: COLONOSCOPY;  Surgeon: Danie Binder, MD;  [facility];  Service: Endoscopy;  Laterality: N/A;  8:30  . Esophagogastroduodenoscopy N/A 12/22/2013    Procedure: ESOPHAGOGASTRODUODENOSCOPY (EGD);  Surgeon: Danie Binder, MD;  [facility];  Service: Endoscopy;  Laterality: N/A;  . Eus N/A 01/01/2014    Procedure: LOWER ENDOSCOPIC ULTRASOUND (EUS);  Surgeon: Milus Banister, MD;  Location: [facility];  Service: Endoscopy;  Laterality: N/A;    History   Social History  . Marital Status: Married    Spouse Name: N/A  Number of Children: N/A  . Years of Education: N/A   Occupational History  . Not on file.   Social History Main Topics  . Smoking status: Former Smoker -- 0.00 packs/day for 15 years    Types: Cigars  . Smokeless tobacco: Not on file  . Alcohol Use: No  . Drug Use: No  . Sexual Activity: Not on file   Other Topics Concern  . 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    Family History  Problem Relation Age of Onset  . Colon polyps Neg Hx   . Colon cancer Sister     Current Outpatient Prescriptions  Medication Sig Dispense Refill  . aspirin EC 81 MG tablet Take 81 mg by mouth daily.      . Multiple Vitamins-Minerals (ONE DAILY FOR MEN 50+ ADVANCED PO) Take 1 tablet by mouth daily.      . Naproxen Sodium (ALEVE) 220 MG CAPS Take 220 mg by mouth 2 (two) times daily.      . Omega-3 Fatty Acids (FISH OIL) 1000 MG CAPS Take 1,000 mg by mouth daily.      Marland Kitchen omeprazole (PRILOSEC) 20 MG capsule 1 po every morning 30 minutes prior to your first meal.  30 capsule  11  . OVER THE COUNTER MEDICATION Take 500 mg by mouth 3 (three) times daily. Athechel      . OVER THE COUNTER MEDICATION Take 1 tablet by mouth 2 (two) times daily. Biothetrapex      . OVER THE COUNTER MEDICATION Take 2 tablets by mouth 2 (two) times daily. Joint CPR Advanced Relief      . saw palmetto 160 MG capsule Take 160 mg by mouth daily.      . vitamin C (ASCORBIC ACID) 500 MG tablet Take 500 mg by mouth 2 (two) times daily.      . metroNIDAZOLE (FLAGYL) 500 MG tablet Take 1 tablet (500 mg total) by mouth as directed. Take 2 pills (=1000mg ) by mouth at 1pm, 3pm, and 10pm the day before your colorectal operation as discussed in [place] office.  Call [phone] with questions  6 tablet  0  . neomycin (MYCIFRADIN) 500 MG tablet Take 2 tablets (1,000 mg total) by mouth as directed. Take 2 pills (=1000mg ) by mouth at 1pm, 3pm, and 10pm the day before your colorectal operation as discussed in [place] office.  Call [phone] with questions  6 tablet  0   No current facility-administered medications for this visit.     No Known Allergies  BP 140/82  Pulse 80  Temp(Src) 97.5 F (36.4 C)  Ht 5\' 5"  (1.651 m)  Wt 179 lb (81.194 kg)  BMI 29.79 kg/m2  Dg Chest 2 View  12/29/2013   CLINICAL DATA:  Rectal mass found on colonoscopy last week.  EXAM: CHEST  2 VIEW  COMPARISON:  None.   FINDINGS: The heart size and mediastinal contours are within normal limits. Both lungs are clear. The visualized skeletal structures are unremarkable.  IMPRESSION: No active cardiopulmonary disease.   Electronically Signed   By: Lucienne Capers M.D.   On: 12/29/2013 21:38   Ct Abdomen Pelvis W Contrast  12/29/2013   CLINICAL DATA:  Newly diagnosed rectal carcinoma.  EXAM: CT ABDOMEN AND PELVIS WITH CONTRAST  TECHNIQUE: Multidetector CT imaging of the abdomen and pelvis was performed using the standard protocol following bolus administration of intravenous contrast.  CONTRAST:  18mL OMNIPAQUE  IOHEXOL 300 MG/ML  SOLN  COMPARISON:  None.  FINDINGS: The liver, gallbladder, pancreas, spleen, adrenal glands, and kidneys are normal in appearance. No evidence hydronephrosis.  Severe diverticulosis is seen mainly involving the sigmoid colon. No evidence of diverticulitis. Both a plaque-like area of wall thickening and a small intraluminal polyp are seen along the left posterior wall the rectum, consistent with known rectal carcinoma. No evidence of lymphadenopathy within the perirectal space or sigmoid mesocolon. No lymphadenopathy seen elsewhere within the pelvis or abdomen. A left retroaortic renal vein is incidentally noted, which is a normal variant.  No evidence of inflammatory process or abnormal fluid collections. No evidence of bowel obstruction. No suspicious bone lesions identified. Visualized portions of lung bases are clear. Old left hip fracture deformity and severe left hip osteoarthritis incidentally noted.  IMPRESSION: Left posterior rectal wall thickening, consistent with known rectal carcinoma.  No evidence of local or distant metastatic disease.  Diverticulosis. No radiographic evidence of diverticulitis.   Electronically Signed   By: Earle Gell M.D.   On: 12/29/2013 18:39   Nm Pet Image Restag (ps) Skull Base To Thigh  01/09/2014   CLINICAL DATA:  Initial treatment strategy for colorectal  carcinoma.  EXAM: NUCLEAR MEDICINE PET SKULL BASE TO THIGH  TECHNIQUE: 8.7 MCi F-18 FDG was injected intravenously. Full-ring PET imaging was performed from the skull base to thigh after the radiotracer. CT data was obtained and used for attenuation correction and anatomic localization.  FASTING BLOOD GLUCOSE:  Value: 91 mg/dl  COMPARISON:  CT 12/29/2013  FINDINGS: NECK  No hypermetabolic lymph nodes in the neck.  CHEST  No hypermetabolic mediastinal or hilar nodes. No suspicious pulmonary nodules on the CT scan.  ABDOMEN/PELVIS  No abnormal hypermetabolic activity within the liver, pancreas, adrenal glands, or spleen. No hypermetabolic lymph nodes in the abdomen or pelvis.  There is hypermetabolic focus along the posterior wall of rectum several cm from anal verge with SUV max = 9.3. Only minimal thickening on the CT portion (image 176 in fused series).  SKELETON  No focal hypermetabolic activity to suggest skeletal metastasis.  IMPRESSION: 1. No evidence of local colorectal cancer nodal metastasis. 2. No evidence distant metastasis. 3. Primary tumor  within the distal rectum is hypermetabolic.   Electronically Signed   By: Suzy Bouchard M.D.   On: 01/09/2014 15:01   CEA 2.8  Review of Systems  Constitutional: Negative for fever, chills and diaphoresis.  HENT: Negative for ear discharge, facial swelling, mouth sores, nosebleeds, sore throat and trouble swallowing.   Eyes: Negative for photophobia, discharge and visual disturbance.  Respiratory: Negative for choking, chest tightness, shortness of breath and stridor.   Cardiovascular: Negative for chest pain and palpitations.  Gastrointestinal: Negative for nausea, vomiting, abdominal pain, diarrhea, constipation, blood in stool, abdominal distention, anal bleeding and rectal pain.  Endocrine: Negative for cold intolerance and heat intolerance.  Genitourinary: Negative for dysuria, urgency, difficulty urinating and testicular pain.  Musculoskeletal:  Negative for arthralgias, back pain, gait problem and myalgias.  Skin: Negative for color change, pallor, rash and wound.  Allergic/Immunologic: Negative for environmental allergies and food allergies.  Neurological: Negative for dizziness, speech difficulty, weakness, numbness and headaches.  Hematological: Negative for adenopathy. Does not bruise/bleed easily.  Psychiatric/Behavioral: Negative for hallucinations, confusion and agitation.       Objective:   Physical Exam  Constitutional: He is oriented to person, place, and time. He appears well-developed and well-nourished. No distress.  HENT:  Head: Normocephalic.  Mouth/Throat:  Oropharynx is clear and moist. No oropharyngeal exudate.  Eyes: Conjunctivae and EOM are normal. Pupils are equal, round, and reactive to light. No scleral icterus.  Neck: Normal range of motion. Neck supple. No tracheal deviation present.  Cardiovascular: Normal rate, regular rhythm and intact distal pulses.   Pulmonary/Chest: Effort normal and breath sounds normal. No respiratory distress.  Abdominal: Soft. He exhibits no distension. There is no tenderness. Hernia confirmed negative in the right inguinal area and confirmed negative in the left inguinal area.  Genitourinary: Testes normal and penis normal.  Exam done with assistance of Dr Lucia Gaskins in the room.  Perianal skin clean with good hygiene.  No pruritis.  No pilonidal disease.  No fissure.  No abscess/fistula.  Normal sphincter tone.  Tolerates digital rectal exam.  Hemorrhoidal piles WNL.    2.5 cm mobile mass in the left posterior rectum,  3-4 cm proximal to anal verge.     Musculoskeletal: He exhibits no tenderness.       Left knee: He exhibits decreased range of motion. No tenderness found.       Legs: Lymphadenopathy:    He has no cervical adenopathy.       Right: No inguinal adenopathy present.       Left: No inguinal adenopathy present.  Neurological: He is alert and oriented to person,  place, and time. No cranial nerve deficit. He exhibits normal muscle tone. Coordination normal.  Skin: Skin is warm and dry. No rash noted. He is not diaphoretic. No erythema. No pallor.  Psychiatric: He has a normal mood and affect. His behavior is normal. Judgment and thought content normal.       Assessment:     Very distal T2 rectal cancer    Plan:     I think he would benefit from resection of this cancer.    It seems stage I.  I think there is enough room that sphincter preservation can be done without sacrificing resection margins.  I agree that neoadjuvant chemoradiation therapy will not be needed.  Challenge will be to figure out if we can safely do a stapled coloanal baker-type anastomosis or if a handsewn coloanal anastomosis will be needed.  Plan a [place] in room.  Also will need to have left leg straight so probably split leg on the left and then on the right side.  We can try split length on both sides and see if that works  The need for diverting loop ileostomy is a definite.  Hopefully it can be temporary and be taken down in 3-12 months, depending on need for post adjuvant chemotherapy or radiation.  The patient is very motivated to proceed with surgery.  He had a sister with cancer and does not want to go through her struggles if possible.  I discussed with the patient and his good friend in the presence of Dr. Lucia Gaskins:  The anatomy & physiology of the digestive tract was discussed.  The pathophysiology was discussed.  Natural history risks without surgery was discussed.   I worked to give an overview of the disease and the frequent need to have multispecialty involvement.  I feel the risks of no intervention will lead to serious problems that outweigh the operative risks; therefore, I recommended a partial proctocolectomy to remove the pathology.  Minimally Invasive (Robotic/Laparoscopic) & open techniques were discussed.  We will work to preserve anal & pelvic floor function  without sacrificing cure.  Risks such as bleeding, infection, abscess, leak, reoperation, possible  ostomy, hernia, heart attack, death, and other risks were discussed.  I noted a good likelihood this will help address the problem.   Goals of post-operative recovery were discussed as well.  We will work to minimize complications.  An educational handout on the pathology was given as well.  Questions were answered.    The patient expresses understanding & wishes to proceed with surgery.

## 2014-01-15 NOTE — Progress Notes (Signed)
Re:   [name] DOB:   1956-01-14 MRN:   [id number]  ASSESSMENT AND PLAN: 1.  Rectal cancer - at 3-4 cm from anal verge (T2,N0)  The tumor is close, but appears far enough to try anal sparing surgery.  I gave him literature on colon cancer and surgery.  I have reviewed the patient with Dr. Johney Maine (who is doing colonic robotic surgery) to see the patient and assume care.  He thinks that he can get below the tumor for sphincter sparing results.    2.  GERD  With esophagitis 3.  Arthritis 4.  Old left leg injury which limits left leg mobility.  Chief Complaint  Patient presents with  . eval colon ca   REFERRING PHYSICIAN: Rubbie Battiest, MD  HISTORY OF PRESENT ILLNESS: [name] is a 58 y.o. (DOB: 1956/01/26)  white  male whose primary care physician is Rubbie Battiest, MD and comes to me today for rectal cancer. The patient is accompanied by his father. The patient noticed some rectal bleeding a couple of months ago.  He also had a sister who had died of colon cancer in her 81's.  He has no prior GI history and has had no prior colonoscopy.  He had a colonoscopy by Dr. Barney Drain on 12/22/2013 which showed a rectal mass measuring 1.5 x 3.0 cm.  He had a rectal [place] by Dr. Rande Brunt on 01/01/2014 which shows a uT2N0 tumor. Path from biopsy on 12/22/2013 ([id number]) shows invasive adenocarcinoma. He has seen Dr. Barnet Glasgow who does not think that there is an indication for pre op chemo/radiation.   Past Medical History  Diagnosis Date  . GERD (gastroesophageal reflux disease)   . Shortness of breath     due to weight gain poststopping smokeing  . Arthritis      Past Surgical History  Procedure Laterality Date  . No past surgeries    . Colonoscopy N/A 12/22/2013    Procedure: COLONOSCOPY;  Surgeon: Danie Binder, MD;  [facility];  Service: Endoscopy;  Laterality: N/A;  8:30  . Esophagogastroduodenoscopy N/A 12/22/2013    Procedure: ESOPHAGOGASTRODUODENOSCOPY (EGD);   Surgeon: Danie Binder, MD;  [facility];  Service: Endoscopy;  Laterality: N/A;  . Eus N/A 01/01/2014    Procedure: LOWER ENDOSCOPIC ULTRASOUND (EUS);  Surgeon: Milus Banister, MD;  Location: [facility];  Service: Endoscopy;  Laterality: N/A;     Current Outpatient Prescriptions  Medication Sig Dispense Refill  . aspirin EC 81 MG tablet Take 81 mg by mouth daily.      . Multiple Vitamins-Minerals (ONE DAILY FOR MEN 50+ ADVANCED PO) Take 1 tablet by mouth daily.      . Naproxen Sodium (ALEVE) 220 MG CAPS Take 220 mg by mouth 2 (two) times daily.      . Omega-3 Fatty Acids (FISH OIL) 1000 MG CAPS Take 1,000 mg by mouth daily.      Marland Kitchen omeprazole (PRILOSEC) 20 MG capsule 1 po every morning 30 minutes prior to your first meal.  30 capsule  11  . OVER THE COUNTER MEDICATION Take 500 mg by mouth 3 (three) times daily. Athechel      . OVER THE COUNTER MEDICATION Take 1 tablet by mouth 2 (two) times daily. Biothetrapex      . OVER THE COUNTER MEDICATION Take 2 tablets by mouth 2 (two) times daily. Joint CPR Advanced Relief      . saw palmetto 160 MG  capsule Take 160 mg by mouth daily.      . vitamin C (ASCORBIC ACID) 500 MG tablet Take 500 mg by mouth 2 (two) times daily.       No current facility-administered medications for this visit.     No Known Allergies  REVIEW OF SYSTEMS: Skin:  No history of rash.  No history of abnormal moles. Infection:  Poor dentition. Neurologic:  No history of stroke.  No history of seizure.  No history of headaches. Cardiac:  Saw Mr. McDowell for cards.  Was told everything was okay. Pulmonary:  Quit smoking 2014.  Endocrine:  No diabetes. No thyroid disease. Gastrointestinal:  No history of stomach disease.  No history of liver disease.  No history of gall bladder disease.  No history of pancreas disease.  No history of colon disease. Urologic:  No history of kidney stones.  No history of bladder infections. Musculoskeletal:  Injury to left  hip/leg at age 13 in tractor accident.  He has some muscle loss of left thigh and some limited motion of left leg. Hematologic:  No bleeding disorder.  No history of anemia.  Not anticoagulated. Psycho-social:  The patient is oriented.   The patient has no obvious psychologic or social impairment to understanding our conversation and plan.  SOCIAL and FAMILY HISTORY: Married.  His father, [name], is with him. His wife works at a [employer], corporate and she has 2 sons. He has one son, 75 yo.  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  PHYSICAL EXAM: BP 140/82  Pulse 80  Temp(Src) 97.5 F (36.4 C)  Ht 5\' 5"  (1.651 m)  Wt 179 lb (81.194 kg)  BMI 29.79 kg/m2  General: WN WM who is alert.  HEENT: Normal. Pupils equal.  Poor dentition.  On his picture, he has a full beard, but he has shaved this.  He has some decreased hearing in his left ear from an old tractor injury.  He has old right craniotomy scar. Neck: Supple. No mass.  No thyroid mass. Lymph Nodes:  No supraclavicular or cervical nodes. Lungs: Clear to auscultation and symmetric breath sounds. Heart:  RRR. No murmur or rub.  Abdomen: Soft. No mass. No tenderness. No hernia. Normal bowel sounds.  No abdominal scars. Rectal: He has a mobile rectal tumor posterior and to the left.  It feels about 2.5 cm in size is about 3 to 4 cm from the anal verge. Extremities:  He has chronic left left pain and discomfort from old tractor injury when he was 58 yo. Neurologic:  Grossly intact to motor and sensory function. Psychiatric: Has normal mood and affect. Behavior is normal.   DATA REVIEWED: Epic notes  Alphonsa Overall, MD,  [facility] [address] [place].,  [place], [place]    [place] Phone:  [place]:  [phone]

## 2014-01-15 NOTE — Progress Notes (Signed)
[facility], M.D.  NEW PATIENT EVALUATION   Name: [name] Date: 01/15/2014 MRN: [id number] DOB: Mar 27, 1956  PCP: Rubbie Battiest, MD   REFERRING PHYSICIAN: [name], MD  REASON FOR REFERRAL: Rectal cancer.     HISTORY OF PRESENT ILLNESS:[name] is a 58 y.o. male who is referred by his family physician for recommendations regarding the management of newly diagnosed rectal cancer. Since February of 2015 the patient is noted intermittent rectal bleeding with some abdominal distention but no nausea, vomiting, anorexia, weight loss, fever, or night sweats. He denies any significant lower extremity swelling or redness, but has been more short of breath and underwent a complete Cardiologic evaluation with findings that were negative. He denies any sore throat, cough, wheezing, PND, orthopnea, or palpitations. He also denies any diarrhea, melena, or constipation. He works as a Psychologist, sport and exercise outdoors and is concerned about rashes that arise on his neck as well as both upper extremities. He does not use sunscreen. He did seek medical attention and was evaluated by gastroenterology at which time the tumor was found in the left anterior rectum, biopsy which was consistent with adenocarcinoma.   PAST MEDICAL HISTORY:  has a past medical history of GERD (gastroesophageal reflux disease); Shortness of breath; and Arthritis.     PAST SURGICAL HISTORY: Past Surgical History  Procedure Laterality Date  . No past surgeries    . Colonoscopy N/A 12/22/2013    Procedure: COLONOSCOPY;  Surgeon: Danie Binder, MD;  Location: [facility];  Service: Endoscopy;  Laterality: N/A;  8:30  . Esophagogastroduodenoscopy N/A 12/22/2013    Procedure: ESOPHAGOGASTRODUODENOSCOPY (EGD);  Surgeon: Danie Binder, MD;  Location: [facility];  Service: Endoscopy;  Laterality: N/A;  . Eus N/A 01/01/2014    Procedure: LOWER ENDOSCOPIC ULTRASOUND  (EUS);  Surgeon: Milus Banister, MD;  Location: [facility];  Service: Endoscopy;  Laterality: N/A;     CURRENT MEDICATIONS: has a current medication list which includes the following prescription(s): aspirin ec, multiple vitamins-minerals, naproxen sodium, fish oil, omeprazole, OVER THE COUNTER MEDICATION, OVER THE COUNTER MEDICATION, OVER THE COUNTER MEDICATION, saw palmetto, and vitamin c.   ALLERGIES: Review of patient's allergies indicates no known allergies.   SOCIAL HISTORY:  reports that he has quit smoking. His smoking use included Cigars. He does not have any smokeless tobacco history on file. He reports that he does not drink alcohol or use illicit drugs.   FAMILY HISTORY: family history includes Colon cancer in his sister. There is no history of Colon polyps.    REVIEW OF SYSTEMS:  Other than that discussed above is noncontributory.    PHYSICAL EXAM:  weight is 178 lb 8 oz (80.967 kg). His oral temperature is 98.5 F (36.9 C). His blood pressure is 114/88 and his pulse is 85. His respiration is 18.    GENERAL:alert, no distress and comfortable. SKIN: skin color, texture, turgor are normal, upper extremity forearm lichenification of the skin from repeated sun exposure with no evidence of pigmented or ulcerative lesions. EYES: normal, Conjunctiva are pink and non-injected, sclera clear OROPHARYNX:no exudate, no erythema and lips, buccal mucosa, and tongue normal  NECK: supple, thyroid normal size, non-tender, without nodularity CHEST: Slightly increased AP diameter with no gynecomastia. LYMPH:  no palpable lymphadenopathy in the cervical, axillary or inguinal LUNGS: clear to auscultation and percussion with normal breathing effort HEART: regular rate & rhythm and no murmurs  ABDOMEN:abdomen soft, non-tender and normal bowel sounds. Liver and spleen not large. No free fluid wave or shifting dullness. MUSCULOSKELETALl:no cyanosis of digits, no clubbing or edema  NEURO:  alert & oriented x 3 with fluent speech, no focal motor/sensory deficits    LABORATORY DATA:  Office Visit on 01/15/2014  Component Date Value Ref Range Status  . WBC 01/15/2014 7.0  4.0 - 10.5 K/uL Final  . RBC 01/15/2014 4.80  4.22 - 5.81 MIL/uL Final  . Hemoglobin 01/15/2014 14.6  13.0 - 17.0 g/dL Final  . HCT 01/15/2014 43.4  39.0 - 52.0 % Final  . MCV 01/15/2014 90.4  78.0 - 100.0 fL Final  . MCH 01/15/2014 30.4  26.0 - 34.0 pg Final  . MCHC 01/15/2014 33.6  30.0 - 36.0 g/dL Final  . RDW 01/15/2014 13.0  11.5 - 15.5 % Final  . Platelets 01/15/2014 319  150 - 400 K/uL Final  . Neutrophils Relative % 01/15/2014 72  43 - 77 % Final  . Neutro Abs 01/15/2014 4.9  1.7 - 7.7 K/uL Final  . Lymphocytes Relative 01/15/2014 21  12 - 46 % Final  . Lymphs Abs 01/15/2014 1.5  0.7 - 4.0 K/uL Final  . Monocytes Relative 01/15/2014 6  3 - 12 % Final  . Monocytes Absolute 01/15/2014 0.4  0.1 - 1.0 K/uL Final  . Eosinophils Relative 01/15/2014 1  0 - 5 % Final  . Eosinophils Absolute 01/15/2014 0.1  0.0 - 0.7 K/uL Final  . Basophils Relative 01/15/2014 0  0 - 1 % Final  . Basophils Absolute 01/15/2014 0.0  0.0 - 0.1 K/uL Final  Hospital Outpatient Visit on 01/09/2014  Component Date Value Ref Range Status  . Glucose-Capillary 01/09/2014 91  70 - 99 mg/dL Final  Telephone on 12/25/2013  Component Date Value Ref Range Status  . WBC 12/25/2013 7.0  4.0 - 10.5 K/uL Final  . RBC 12/25/2013 4.92  4.22 - 5.81 MIL/uL Final  . Hemoglobin 12/25/2013 15.6  13.0 - 17.0 g/dL Final  . HCT 12/25/2013 43.8  39.0 - 52.0 % Final  . MCV 12/25/2013 89.0  78.0 - 100.0 fL Final  . MCH 12/25/2013 31.7  26.0 - 34.0 pg Final  . MCHC 12/25/2013 35.6  30.0 - 36.0 g/dL Final  . RDW 12/25/2013 13.2  11.5 - 15.5 % Final  . Platelets 12/25/2013 306  150 - 400 K/uL Final  . Neutrophils Relative % 12/25/2013 66  43 - 77 % Final  . Neutro Abs 12/25/2013 4.6  1.7 - 7.7 K/uL Final  . Lymphocytes Relative 12/25/2013 23  12 -  46 % Final  . Lymphs Abs 12/25/2013 1.6  0.7 - 4.0 K/uL Final  . Monocytes Relative 12/25/2013 9  3 - 12 % Final  . Monocytes Absolute 12/25/2013 0.6  0.1 - 1.0 K/uL Final  . Eosinophils Relative 12/25/2013 2  0 - 5 % Final  . Eosinophils Absolute 12/25/2013 0.1  0.0 - 0.7 K/uL Final  . Basophils Relative 12/25/2013 0  0 - 1 % Final  . Basophils Absolute 12/25/2013 0.0  0.0 - 0.1 K/uL Final  . Smear Review 12/25/2013 Criteria for review not met   Final  . Sodium 12/25/2013 139  135 - 145 mEq/[name] Final  . Potassium 12/25/2013 4.3  3.5 - 5.3 mEq/[name] Final  . Chloride 12/25/2013 105  96 - 112 mEq/[name] Final  . CO2 12/25/2013 26  19 - 32 mEq/[name] Final  . Glucose, Bld 12/25/2013 88  70 -  99 mg/dL Final  . BUN 12/25/2013 23  6 - 23 mg/dL Final  . Creat 12/25/2013 0.89  0.50 - 1.35 mg/dL Final  . Total Bilirubin 12/25/2013 0.6  0.2 - 1.2 mg/dL Final  . Alkaline Phosphatase 12/25/2013 76  39 - 117 U/[name] Final  . AST 12/25/2013 14  0 - 37 U/[name] Final  . ALT 12/25/2013 15  0 - 53 U/[name] Final  . Total Protein 12/25/2013 6.5  6.0 - 8.3 g/dL Final  . Albumin 12/25/2013 4.0  3.5 - 5.2 g/dL Final  . Calcium 12/25/2013 9.5  8.4 - 10.5 mg/dL Final  . GFR, Est African American 12/25/2013 >89   Final  . GFR, Est Non African American 12/25/2013 >89   Final   Comment:                            The estimated GFR is a calculation valid for adults (>=30 years old)                          that uses the CKD-EPI algorithm to adjust for age and sex. It is                            not to be used for children, pregnant women, hospitalized patients,                             patients on dialysis, or with rapidly changing kidney function.                          According to the NKDEP, eGFR >89 is normal, 60-89 shows mild                          impairment, 30-59 shows moderate impairment, 15-29 shows severe                          impairment and <15 is ESRD.                             Marland Kitchen Prothrombin Time 12/25/2013  13.0  11.6 - 15.2 seconds Final  . INR 12/25/2013 0.99  <1.50 Final   Comment: The INR is of principal utility in following patients on stable doses                          of oral anticoagulants.  The therapeutic range is generally 2.0 to                          3.0, but may be 3.0 to 4.0 in patients with mechanical cardiac valves,                          recurrent embolisms and antiphospholipid antibodies (including lupus                          inhibitors).  . CEA 12/25/2013 2.8  0.0 - 5.0 ng/mL Final  Admission on 12/22/2013, Discharged on 12/22/2013  Component Date Value Ref Range  Status  . Specimen Description 12/22/2013 ESOPHAGUS BRUSHING   Final  . Special Requests 12/22/2013 NONE   Final  . KOH Prep 12/22/2013    Final                   Value:MODERATE YEAST WITH PSEUDOHYPHAE                         Performed at [facility]  . Report Status 12/22/2013 12/22/2013 FINAL   Final    Urinalysis No results found for this basename: colorurine,  appearanceur,  labspec,  phurine,  glucoseu,  hgbur,  bilirubinur,  ketonesur,  proteinur,  urobilinogen,  nitrite,  leukocytesur      _0 : Dg Chest 2 View  12/29/2013   CLINICAL DATA:  Rectal mass found on colonoscopy last week.  EXAM: CHEST  2 VIEW  COMPARISON:  None.  FINDINGS: The heart size and mediastinal contours are within normal limits. Both lungs are clear. The visualized skeletal structures are unremarkable.  IMPRESSION: No active cardiopulmonary disease.   Electronically Signed   By: Lucienne Capers M.D.   On: 12/29/2013 21:38   Ct Abdomen Pelvis W Contrast  12/29/2013   CLINICAL DATA:  Newly diagnosed rectal carcinoma.  EXAM: CT ABDOMEN AND PELVIS WITH CONTRAST  TECHNIQUE: Multidetector CT imaging of the abdomen and pelvis was performed using the standard protocol following bolus administration of intravenous contrast.  CONTRAST:  115m OMNIPAQUE IOHEXOL 300 MG/ML  SOLN  COMPARISON:  None.  FINDINGS: The liver,  gallbladder, pancreas, spleen, adrenal glands, and kidneys are normal in appearance. No evidence hydronephrosis.  Severe diverticulosis is seen mainly involving the sigmoid colon. No evidence of diverticulitis. Both a plaque-like area of wall thickening and a small intraluminal polyp are seen along the left posterior wall the rectum, consistent with known rectal carcinoma. No evidence of lymphadenopathy within the perirectal space or sigmoid mesocolon. No lymphadenopathy seen elsewhere within the pelvis or abdomen. A left retroaortic renal vein is incidentally noted, which is a normal variant.  No evidence of inflammatory process or abnormal fluid collections. No evidence of bowel obstruction. No suspicious bone lesions identified. Visualized portions of lung bases are clear. Old left hip fracture deformity and severe left hip osteoarthritis incidentally noted.  IMPRESSION: Left posterior rectal wall thickening, consistent with known rectal carcinoma.  No evidence of local or distant metastatic disease.  Diverticulosis. No radiographic evidence of diverticulitis.   Electronically Signed   By: JEarle GellM.D.   On: 12/29/2013 18:39   Nm Pet Image Restag (ps) Skull Base To Thigh  01/09/2014   CLINICAL DATA:  Initial treatment strategy for colorectal carcinoma.  EXAM: NUCLEAR MEDICINE PET SKULL BASE TO THIGH  TECHNIQUE: 8.7 MCi F-18 FDG was injected intravenously. Full-ring PET imaging was performed from the skull base to thigh after the radiotracer. CT data was obtained and used for attenuation correction and anatomic localization.  FASTING BLOOD GLUCOSE:  Value: 91 mg/dl  COMPARISON:  CT 12/29/2013  FINDINGS: NECK  No hypermetabolic lymph nodes in the neck.  CHEST  No hypermetabolic mediastinal or hilar nodes. No suspicious pulmonary nodules on the CT scan.  ABDOMEN/PELVIS  No abnormal hypermetabolic activity within the liver, pancreas, adrenal glands, or spleen. No hypermetabolic lymph nodes in the abdomen or  pelvis.  There is hypermetabolic focus along the posterior wall of rectum several cm from anal verge with SUV max = 9.3. Only minimal thickening on the CT portion (  image 176 in fused series).  SKELETON  No focal hypermetabolic activity to suggest skeletal metastasis.  IMPRESSION: 1. No evidence of local colorectal cancer nodal metastasis. 2. No evidence distant metastasis. 3. Primary tumor  within the distal rectum is hypermetabolic.   Electronically Signed   By: Suzy Bouchard M.D.   On: 01/09/2014 15:01   [place] [name], [id number] ENDOSCOPIC ULTRASOUND PROCEDURE REPORT PATIENT: [name], [name] MR#: [id number] BIRTHDATE: Aug 07, 1956 GENDER: Male ENDOSCOPIST: Milus Banister, MD REFERRED BY: [name], M.D. PROCEDURE DATE: 01/01/2014 PROCEDURE: Lower EUS, flex sigmoidoscopy with submucosal injection ASA CLASS: Class II INDICATIONS: recently diagnosed rectal adenocarcinoma; no metastatic disease on a/p CT scan. MEDICATIONS: Fentanyl 50 mcg IV and Versed 4 mg IV DESCRIPTION OF PROCEDURE: After the risks benefits and alternatives of the procedure were explained, informed consent was obtained. The patient was then placed in the left, lateral, decubitus postion and IV sedation was administered. Throughout the procedure, the patients blood pressure, pulse and oxygen saturations were monitored continuously. Under direct visualization, the Pentax Radial EUS [id number] endoscope was introduced through the anus and advanced to the sigmoid colon . Water was used as necessary to provide an acoustic interface. Upon completion of the imaging, water was removed and the patient was sent to the recovery room in satisfactory condition. Sigmoidoscopic findings: 1. Raised, round mass in distal rectum. The mass was 2cm across, distal edge was 3-4cm from the anal verge. The lateral edges of the mass were labled with injection of [place] Ink. EUS findings: 1. The mass above  correlated with a hypoechoic left anterior mass on EUS that was 1.7cm across, appeared to focally involve the muscularis proprial layer of the rectal wall but did not invade through the layer (uT2) 2. There was no perirectal adenopathy (uN0) Impression: 1.7cm uT2N0 rectal adenocarcinoma that lays along left/anterior wall of distal rectum, the distal edge of the mass is 3-4cm from the anal verge. Appears to be appropriate for up front resection. _______________________________ eSignedMilus Banister,   PATHOLOGY:  FINAL for [name], [name] [phone]) Patient: [name] Collected: 12/22/2013 Client: [facility] Accession: [id number] Received: 12/22/2013 Connor Drain, MD DOB: 1956/05/12 Age: 85 Gender: M Reported: 12/23/2013 [address] Patient Ph: [phone] MRN #: [id number] [name], [name] Visit #: [id number] Chart #: Phone: Fax: CC: REPORT OF SURGICAL PATHOLOGY ADDITIONAL INFORMATION: 1. Mismatch Repair (MMR) Protein Immunohistochemistry (IHC) IHC Expression Result: MLH1: Preserved nuclear expression (greater 50% tumor expression) MSH2: Preserved nuclear expression (greater 50% tumor expression) MSH6: Preserved nuclear expression (greater 50% tumor expression) PMS2: Preserved nuclear expression (greater 50% tumor expression) * Internal control demonstrates intact nuclear expression Interpretation: NORMAL There is preserved expression of the major and minor MMR proteins. There is a very low probability that microsatellite instability (MSI) is present. However, certain clinically significant MMR protein mutations may result in preservation of nuclear expression. It is recommended that the preservation of protein expression be correlated with molecular based MSI testing. References: 1. Guidelines on Genetic Evaluation and Management of Lynch Syndrome: A Consensus Statement by the [place] Multi-Society Task Force on Colorectal Cancer Gae Dry. Sherlie Ban , MD,  and others . Am Nicki [place] 2014; [phone]; doi: 10.1038/ajg.2014.186; published online 11 March 2013 2. Outcomes of screening endometrial cancer patients for Lynch syndrome by patient-administered checklist. Olena Heckle MS, and others. Gynecol Oncol 2013;131(3):619-623. Susanne Greenhouse MD Pathologist, Electronic Signature ( Signed 12/26/2013) 1. Immunostains were performed and the tumor cells are positive for CDX-2,  CK20, negative for CK7 and PSA with appropriate controls. The findings support a diagnosis of colorectal adenocarcinoma. Aldona Bar MD Pathologist, Electronic Signature ( Signed 12/24/2013) 1 of 3 FINAL for [name], [name] [phone]) FINAL DIAGNOSIS Diagnosis 1. Rectum, biopsy, nodule - INVASIVE ADENOCARCINOMA. 2. Stomach, biopsy - GASTRIC BODY TYPE MUCOSA. - NO EVIDENCE OF ACTIVE INFLAMMATION, HELICOBACTER PYLORI, INTESTINAL METAPLASIA, DYSPLASIA OR MALIGNANCY. 3. Esophagus, biopsy - GASTROESOPHAGEAL JUNCTION MUCOSA WITH MILD INFLAMMATION CONSISTENT WITH GASTROESOPHAGEAL REFLUX. NO INTESTINAL METAPLASIA, DYSPLASIA OR MALIGNANCY IDENTIFIED. Microscopic Comment 1. Further immunostains and MMR stains will be performed and an addendum will follow. Case was discussed with Dr. Eden Lathe on 12-23-2013. 2. A Warthin-Starry stain is performed to determine the possibility of the presence of Helicobacter pylori. The Warthin-Starry stain is negative for organisms of Helicobacter pylori. Aldona Bar MD Pathologist, Electronic Signature (Case signed 12/23/2013) Specimen Gross and Clinical Information Specimen(s) Obtained: 1. Rectum, biopsy, nodule 2. Stomach, biopsy 3. Esophagus, biopsy Specimen Clinical Information 3. evaluate probable short segment Barretts; Pre-op: rectal bleeding and GERD; Post-op: lower: normal terminal ileum; diverticulosis; rectal nodule; hemorrhoids; upper: gastritis; probable Barrett's Gross 1. Received in formalin are tan, soft tissue  fragments that are submitted in toto. Number: nine pieces, Size: 0.2 to 0.5 cm (Aggregate measurement: 1.5 x 1.3 x 0.4 cm). One block is submitted. 2. Received in formalin are tan, soft tissue fragments that are submitted in toto. Number: seven pieces, Size: 0.1 to 0.5 cm, one block is submitted. 3. Received in formalin are tan, soft tissue fragments that are submitted in toto. Number: two Size: 0.3 and 0.5 cm, one block is submitted. (TB:ecj 12/22/2013) Stain(s) used in Diagnosis: The following stain(s) were used in diagnosing the case: MSH6, Prostate Specific Antigen, CDX-2, MLH1, CK 20, PMS2, MSH2, Warthin-Starry Stain, CK-7. The control(s) stained appropriately. Disclaimer Some of these immunohistochemical stains may have been developed and the performance characteristics determined by [facility]. Some may not have been cleared or approved by the U.S. Food and Drug Administration. The FDA has determined that such clearance or approval is not necessary. This test is used for clinical purposes. It should not be regarded as investigational or for research. This laboratory is certified under the [place] (CLIA-88) as qualified to perform high complexity clinical laboratory testing. 2 of 3 FINAL for [name], [name] ([id number]) Report signed out from the following location(s) Technical Component performed at [place], [place] [address] CLIA: [id number]., Technical Component performed at [place], [place], [place] [address]. CLIA #: [id number], Interpretation performed at [place], [place], [place] [address]. CLIA #:   IMPRESSION:  #1.uT2N0Mo (Stage I) Adenocarcinoma of the rectum with distal age 52-4 cm from the anal verge. No evidence of distant disease on PET scan. #2Nancy Fetter damaged dermatitis. #3. Gastroesophageal reflux disease, on  treatment. #4. Degenerative joint disease of the spine, using Aleve.   PLAN:  #1. Recommend surgical consultation for probable left anterior resection. Patient does have an appointment with surgery this afternoon. No indication for preoperative therapy. #2. Followup in 6 weeks with CBC.  I appreciate the opportunity of sharing in his care.   Farrel Gobble, MD 01/15/2014 1:50 PM   DISCLAIMER:  This note was dictated with voice recognition softwre.  Similar sounding words can inadvertently be transcribed inaccurately and may not be corrected upon review.

## 2014-01-15 NOTE — Patient Instructions (Signed)
[facility] Discharge Instructions  RECOMMENDATIONS MADE BY THE CONSULTANT AND ANY TEST RESULTS WILL BE SENT TO YOUR REFERRING PHYSICIAN. We will see you in 6 weeks for a doctor appointment and repeat lab work.  Thank you for choosing [facility] to provide your oncology and hematology care.  To afford each patient quality time with our providers, please arrive at least 15 minutes before your scheduled appointment time.  With your help, our goal is to use those 15 minutes to complete the necessary work-up to ensure our physicians have the information they need to help with your evaluation and healthcare recommendations.    Effective January 1st, 2014, we ask that you re-schedule your appointment with our physicians should you arrive 10 or more minutes late for your appointment.  We strive to give you quality time with our providers, and arriving late affects you and other patients whose appointments are after yours.    Again, thank you for choosing [facility].  Our hope is that these requests will decrease the amount of time that you wait before being seen by our physicians.       _____________________________________________________________  Should you have questions after your visit to [facility], please contact our office at [phone] between the hours of 8:30 a.m. and 5:00 p.m.  Voicemails left after 4:30 p.m. will not be returned until the following business day.  For prescription refill requests, have your pharmacy contact our office with your prescription refill request.

## 2014-01-15 NOTE — Progress Notes (Signed)
[name]'s reason for visit today are for labs as scheduled per MD orders.  Venipuncture performed with a 23 gauge butterfly needle to L Antecubital.  Connor Slicker Pound tolerated venipuncture well and without incident; questions were answered and patient was discharged.

## 2014-01-16 LAB — CEA: CEA: 2.8 ng/mL (ref 0.0–5.0)

## 2014-01-20 ENCOUNTER — Telehealth (INDEPENDENT_AMBULATORY_CARE_PROVIDER_SITE_OTHER): Payer: Self-pay | Admitting: Surgery

## 2014-01-20 NOTE — Telephone Encounter (Signed)
Please see financial counseling can help in some way.  I have seen this patient and committed to completing his resection.  Cancer patient

## 2014-01-20 NOTE — Telephone Encounter (Signed)
pt is self pay / if scheduled before medicaid is active they will not pay/ [facility] policy states treat as self pay / est balance $ [phone] / office visit not paid  Please advise

## 2014-01-21 ENCOUNTER — Ambulatory Visit (INDEPENDENT_AMBULATORY_CARE_PROVIDER_SITE_OTHER): Payer: Medicaid Other | Admitting: Family Medicine

## 2014-01-21 ENCOUNTER — Ambulatory Visit (HOSPITAL_COMMUNITY)
Admission: RE | Admit: 2014-01-21 | Discharge: 2014-01-21 | Disposition: A | Discharge status: Home / Self Care | Payer: Medicaid Other | Source: Ambulatory Visit | Attending: Family Medicine | Admitting: Family Medicine

## 2014-01-21 ENCOUNTER — Encounter: Payer: Self-pay | Admitting: Family Medicine

## 2014-01-21 VITALS — BP 138/86 | Ht 65.0 in | Wt 182.0 lb

## 2014-01-21 DIAGNOSIS — M25559 Pain in unspecified hip: Secondary | ICD-10-CM | POA: Diagnosis present

## 2014-01-21 DIAGNOSIS — M25552 Pain in left hip: Secondary | ICD-10-CM

## 2014-01-21 DIAGNOSIS — M1610 Unilateral primary osteoarthritis, unspecified hip: Secondary | ICD-10-CM | POA: Insufficient documentation

## 2014-01-21 DIAGNOSIS — C20 Malignant neoplasm of rectum: Secondary | ICD-10-CM

## 2014-01-21 DIAGNOSIS — M169 Osteoarthritis of hip, unspecified: Secondary | ICD-10-CM | POA: Diagnosis not present

## 2014-01-21 NOTE — Progress Notes (Signed)
   Subjective:    Patient ID: [name], male    DOB: Feb 01, 1956, 58 y.o.   MRN: [id number]  HPI  Patient arrives to discuss getting disability for leg issues and recent cancer diagnosis.due to have robotic colorectal surgery soon for rectal cancer. See prior notes.  With severe progressive leg pain, patient feels he no longer can perform his job at home. This along with the complications from the colon cancer weekend to feel that at this point he is disabled  Tractor injury bad fracture, was told may not walk.  Chronic pain in both legs  Review of Systems No chest pain no shortness of breath no headache ROS otherwise negative    Objective:   Physical Exam Alert no acute distress except during leg exam lungs clear. Heart regular in rhythm. Abdomen benign. Left leg is shortened. Chronic Limpwith walking Major pain with rotation.       Assessment & Plan:  Impression rectal cancer with major surgery shortly. 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 Discussed at length. Patient pursuing disability and I think this is reasonable. Plan left hip x-ray. Followup with GI surgeons as scheduled soon. ROS otherwise negative

## 2014-01-21 NOTE — Telephone Encounter (Signed)
Pt scheduled for surgery on 03/20/14 by Dr Johney Maine. The pt has applied for disability thru his PCP. I also emailed Maudie Flakes with pt's information for him to get a preop marking for colostomy/ilesotomy marking before surgery.

## 2014-02-26 ENCOUNTER — Other Ambulatory Visit (HOSPITAL_COMMUNITY): Payer: Self-pay

## 2014-02-26 ENCOUNTER — Ambulatory Visit (HOSPITAL_COMMUNITY): Payer: Self-pay

## 2014-03-06 NOTE — Progress Notes (Signed)
03/06/2014-Lori McNichols, Ostomy nurse notified of patient's pre-op appointment time -spoke with her via phone.

## 2014-03-09 ENCOUNTER — Encounter (HOSPITAL_COMMUNITY): Payer: Self-pay | Admitting: Pharmacy Technician

## 2014-03-09 ENCOUNTER — Other Ambulatory Visit (HOSPITAL_COMMUNITY): Payer: Self-pay | Admitting: Anesthesiology

## 2014-03-09 NOTE — Progress Notes (Signed)
lov 11-10-13 dr [facility] cardiology epic Chest xray 12-29-13 epic ekg 10-19-13 epic stres test 11-17-13 epic

## 2014-03-09 NOTE — Patient Instructions (Addendum)
[place]  03/09/2014   Your procedure is scheduled on: Friday July 31st, 2015  Report to [facility] Main Entrance and follow signs to  [place] at 530AM.  Call this number if you have problems the morning of surgery [phone]               Short stay will draw your blood type meorning of surgery   Remember: follow all bowel prep instructions from dr gross,  call dr gross office and check on bowel prep instructions [phone]  Do not eat food  :After Midnight Wednesday night.   clear liquids all day Thursday 03-19-14, no clear liquids after midnight Thursday night.   Take these medicines the morning of surgery with A SIP OF WATER: no meds to take                               You may not have any metal on your body including hair pins and piercings  Do not wear jewelry, make-up, lotions, powders, or deodorant.   Men may shave face and neck.  Do not bring valuables to the hospital. [place].  Contacts, dentures or bridgework may not be worn into surgery.  Leave suitcase in the car. After surgery it may be brought to your room.  For patients admitted to the hospital, checkout time is 11:00 AM the day of discharge.   Patients discharged the day of surgery will not be allowed to drive home.  Name and phone number of your driver:  Special Instructions: N/A ________________________________________________________________________  [name] - Preparing for Surgery Before surgery, you can play an important role.  Because skin is not sterile, your skin needs to be as free of germs as possible.  You can reduce the number of germs on your skin by washing with CHG (chlorahexidine gluconate) soap before surgery.  CHG is an antiseptic cleaner which kills germs and bonds with the skin to continue killing germs even after washing. Please DO NOT use if you have an allergy to CHG or antibacterial soaps.  If your skin becomes  reddened/irritated stop using the CHG and inform your nurse when you arrive at Short Stay. Do not shave (including legs and underarms) for at least 48 hours prior to the first CHG shower.  You may shave your face/neck. Please follow these instructions carefully:  1.  Shower with CHG Soap the night before surgery and the  morning of Surgery.  2.  If you choose to wash your hair, wash your hair first as usual with your  normal  shampoo.  3.  After you shampoo, rinse your hair and body thoroughly to remove the  shampoo.                           4.  Use CHG as you would any other liquid soap.  You can apply chg directly  to the skin and wash                       Gently with a scrungie or clean washcloth.  5.  Apply the CHG Soap to your body ONLY FROM THE NECK DOWN.   Do not use on face/ open  Wound or open sores. Avoid contact with eyes, ears mouth and genitals (private parts).                       Wash face,  Genitals (private parts) with your normal soap.             6.  Wash thoroughly, paying special attention to the area where your surgery  will be performed.  7.  Thoroughly rinse your body with warm water from the neck down.  8.  DO NOT shower/wash with your normal soap after using and rinsing off  the CHG Soap.                9.  Pat yourself dry with a clean towel.            10.  Wear clean pajamas.            11.  Place clean sheets on your bed the night of your first shower and do not  sleep with pets. Day of Surgery : Do not apply any lotions/deodorants the morning of surgery.  Please wear clean clothes to the hospital/surgery center.  FAILURE TO FOLLOW THESE INSTRUCTIONS MAY RESULT IN THE CANCELLATION OF YOUR SURGERY PATIENT SIGNATURE_________________________________  NURSE SIGNATURE__________________________________  ________________________________________________________________________  WHAT IS A BLOOD TRANSFUSION? Blood Transfusion Information  A  transfusion is the replacement of blood or some of its parts. Blood is made up of multiple cells which provide different functions.  Red blood cells carry oxygen and are used for blood loss replacement.  White blood cells fight against infection.  Platelets control bleeding.  Plasma helps clot blood.  Other blood products are available for specialized needs, such as hemophilia or other clotting disorders. BEFORE THE TRANSFUSION  Who gives blood for transfusions?   Healthy volunteers who are fully evaluated to make sure their blood is safe. This is blood bank blood. Transfusion therapy is the safest it has ever been in the practice of medicine. Before blood is taken from a donor, a complete history is taken to make sure that person has no history of diseases nor engages in risky social behavior (examples are intravenous drug use or sexual activity with multiple partners). The donor's travel history is screened to minimize risk of transmitting infections, such as malaria. The donated blood is tested for signs of infectious diseases, such as HIV and hepatitis. The blood is then tested to be sure it is compatible with you in order to minimize the chance of a transfusion reaction. If you or a relative donates blood, this is often done in anticipation of surgery and is not appropriate for emergency situations. It takes many days to process the donated blood. RISKS AND COMPLICATIONS Although transfusion therapy is very safe and saves many lives, the main dangers of transfusion include:   Getting an infectious disease.  Developing a transfusion reaction. This is an allergic reaction to something in the blood you were given. Every precaution is taken to prevent this. The decision to have a blood transfusion has been considered carefully by your caregiver before blood is given. Blood is not given unless the benefits outweigh the risks. AFTER THE TRANSFUSION  Right after receiving a blood transfusion,  you will usually feel much better and more energetic. This is especially true if your red blood cells have gotten low (anemic). The transfusion raises the level of the red blood cells which carry oxygen, and this usually causes an energy increase.  The nurse administering the transfusion will monitor you carefully for complications. HOME CARE INSTRUCTIONS  No special instructions are needed after a transfusion. You may find your energy is better. Speak with your caregiver about any limitations on activity for underlying diseases you may have. SEEK MEDICAL CARE IF:   Your condition is not improving after your transfusion.  You develop redness or irritation at the intravenous (IV) site. SEEK IMMEDIATE MEDICAL CARE IF:  Any of the following symptoms occur over the next 12 hours:  Shaking chills.  You have a temperature by mouth above 102 F (38.9 C), not controlled by medicine.  Chest, back, or muscle pain.  People around you feel you are not acting correctly or are confused.  Shortness of breath or difficulty breathing.  Dizziness and fainting.  You get a rash or develop hives.  You have a decrease in urine output.  Your urine turns a dark color or changes to pink, red, or brown. Any of the following symptoms occur over the next 10 days:  You have a temperature by mouth above 102 F (38.9 C), not controlled by medicine.  Shortness of breath.  Weakness after normal activity.  The white part of the eye turns yellow (jaundice).  You have a decrease in the amount of urine or are urinating less often.  Your urine turns a dark color or changes to pink, red, or brown. Document Released: 08/04/2000 Document Revised: 10/30/2011 Document Reviewed: 03/23/2008 ExitCare Patient Information 2014 ExitCare, [place].  _______________________________________________________________________   CLEAR LIQUID DIET   Foods Allowed                                                                      Foods Excluded  Coffee and tea, regular and decaf                             liquids that you cannot  Plain Jell-O in any flavor                                             see through such as: Fruit ices (not with fruit pulp)                                     milk, soups, orange juice  Iced Popsicles                                    All solid food Carbonated beverages, regular and diet                                    Cranberry, grape and apple juices Sports drinks like Gatorade Lightly seasoned clear broth or consume(fat free) Sugar, honey syrup  Sample Menu Breakfast  Lunch                                     Supper Cranberry juice                    Beef broth                            Chicken broth Jell-O                                     Grape juice                           Apple juice Coffee or tea                        Jell-O                                      Popsicle                                                Coffee or tea                        Coffee or tea  _____________________________________________________________________

## 2014-03-10 ENCOUNTER — Encounter (HOSPITAL_COMMUNITY)
Admission: RE | Admit: 2014-03-10 | Discharge: 2014-03-10 | Disposition: A | Discharge status: Home / Self Care | Payer: Medicaid Other | Source: Ambulatory Visit | Attending: Surgery | Admitting: Surgery

## 2014-03-10 ENCOUNTER — Encounter (INDEPENDENT_AMBULATORY_CARE_PROVIDER_SITE_OTHER): Payer: Self-pay

## 2014-03-10 ENCOUNTER — Encounter (HOSPITAL_COMMUNITY): Payer: Self-pay

## 2014-03-10 DIAGNOSIS — Z01812 Encounter for preprocedural laboratory examination: Secondary | ICD-10-CM | POA: Insufficient documentation

## 2014-03-10 DIAGNOSIS — Z01818 Encounter for other preprocedural examination: Secondary | ICD-10-CM | POA: Diagnosis not present

## 2014-03-10 LAB — CBC
HCT: 45.3 % (ref 39.0–52.0)
Hemoglobin: 15.4 g/dL (ref 13.0–17.0)
MCH: 31 pg (ref 26.0–34.0)
MCHC: 34 g/dL (ref 30.0–36.0)
MCV: 91.3 fL (ref 78.0–100.0)
PLATELETS: 369 10*3/uL (ref 150–400)
RBC: 4.96 MIL/uL (ref 4.22–5.81)
RDW: 13.2 % (ref 11.5–15.5)
WBC: 7.1 10*3/uL (ref 4.0–10.5)

## 2014-03-10 LAB — BASIC METABOLIC PANEL
ANION GAP: 10 (ref 5–15)
BUN: 23 mg/dL (ref 6–23)
CO2: 26 mEq/L (ref 19–32)
Calcium: 9.6 mg/dL (ref 8.4–10.5)
Chloride: 103 mEq/L (ref 96–112)
Creatinine, Ser: 0.88 mg/dL (ref 0.50–1.35)
GFR calc Af Amer: >90 mL/min (ref >90)
GLUCOSE: 93 mg/dL (ref 70–99)
Potassium: 4.8 mEq/L (ref 3.7–5.3)
SODIUM: 139 meq/L (ref 137–147)

## 2014-03-10 NOTE — Consult Note (Signed)
[place] ostomy consult note Patient seen per MD request for assessment of abdomen, preoperative stoma site selection for both ileostomy and colostomy and preoperative education.  Wife is with patient.  Abdomen assessed in the sitting and standing position.  Belt line, abdominal contours and umbilical creases noted.  Site(s) selected are marked with a surgical skin marking pen and are covered with a thin film transparent dressing.   Right upper quadrant (Ileostomy) mark is made 6.5cm to the right of the umbilicus and 2.0FE above the umbilicus. Left lower quadrant (colostomy) mark is made 6cm to the left of the umbilicus and 0.7HQ below the umbilicus. Education provided: Patient and wife asking appropriate questions.  Basic A&P, stoma characteristics and pouch characteristics taught.  Pouch provided for demonstration and patient requests to take it home to show his father.  Patient is tearful and somewhat anxious about not having insurance and because his sister died from colorectal cancer. Reassured that supplies can be provided by Medicaid.  Questions about diet, return to activities of daily living (patient is a Psychologist, sport and exercise) and intimacy answered. Patient's day of surgery is 03/20/14.  Please reconsult when stoma created. Thank you for the opportunity to work with this nice gentleman. Maudie Flakes, MSN, RN, [place], [place], [place] [phone])

## 2014-03-12 ENCOUNTER — Other Ambulatory Visit (INDEPENDENT_AMBULATORY_CARE_PROVIDER_SITE_OTHER): Payer: Self-pay

## 2014-03-12 MED ORDER — NEOMYCIN SULFATE 500 MG PO TABS: 500.0000 mg | ORAL_TABLET | ORAL | Status: DC

## 2014-03-12 MED ORDER — METRONIDAZOLE 500 MG PO TABS: 500.0000 mg | ORAL_TABLET | ORAL | Status: DC

## 2014-03-19 MED ORDER — SODIUM CHLORIDE 0.9 % IV SOLN: 1.0000 "application " | INTRAVENOUS | Status: DC

## 2014-03-19 MED ORDER — SODIUM CHLORIDE 0.9 % IV SOLN
INTRAVENOUS | Status: DC
  Filled 2014-03-19: qty 6

## 2014-03-19 NOTE — Anesthesia Preprocedure Evaluation (Addendum)
Anesthesia Evaluation  Patient identified by MRN, date of birth, ID band Patient awake  General Assessment Comment:Rectal Ca  Reviewed: Allergy & Precautions, H&P , NPO status , Patient's Chart, lab work & pertinent test results  Airway Mallampati: II TM Distance: >3 FB Neck ROM: Full    Dental no notable dental hx.    Pulmonary neg pulmonary ROS, former smoker,  breath sounds clear to auscultation  Pulmonary exam normal       Cardiovascular Rhythm:Regular Rate:Normal    Stress data:   Patientatient exercised 6 min 47 sec to a peak HR of 146 bpm  This was 89% predicted maximall.  Peak BP 173/99.  Patient experienced no CP  EKG showed no ST changes to suggest ischemia   Impression:  Exercise stress test:  Clinically and electrically negative for ischemia.                Neuro/Psych negative neurological ROS  negative psych ROS   GI/Hepatic Neg liver ROS, GERD-  Medicated,  Endo/Other  negative endocrine ROS  Renal/GU negative Renal ROS  negative genitourinary   Musculoskeletal negative musculoskeletal ROS (+)   Abdominal   Peds negative pediatric ROS (+)  Hematology negative hematology ROS (+)   Anesthesia Other Findings   Reproductive/Obstetrics negative OB ROS                        Anesthesia Physical Anesthesia Plan  ASA: II  Anesthesia Plan: General   Post-op Pain Management:    Induction: Intravenous  Airway Management Planned: Oral ETT  Additional Equipment:   Intra-op Plan:   Post-operative Plan: Extubation in OR  Informed Consent: I have reviewed the patients History and Physical, chart, labs and discussed the procedure including the risks, benefits and alternatives for the proposed anesthesia with the patient or authorized representative who has indicated his/her understanding and acceptance.   Dental advisory given  Plan Discussed with: CRNA and  Surgeon  Anesthesia Plan Comments:         Anesthesia Quick Evaluation

## 2014-03-20 ENCOUNTER — Inpatient Hospital Stay (HOSPITAL_COMMUNITY)
Admission: RE | Admit: 2014-03-20 | Discharge: 2014-04-02 | DRG: 329 | Disposition: A | Discharge status: Skilled Nursing Facility | Payer: Medicaid Other | Source: Ambulatory Visit | Attending: Surgery | Admitting: Surgery

## 2014-03-20 ENCOUNTER — Inpatient Hospital Stay (HOSPITAL_COMMUNITY): Payer: Medicaid Other | Admitting: Anesthesiology

## 2014-03-20 ENCOUNTER — Encounter (HOSPITAL_COMMUNITY): Payer: Self-pay | Admitting: *Deleted

## 2014-03-20 ENCOUNTER — Encounter (HOSPITAL_COMMUNITY): Payer: Medicaid Other | Admitting: Anesthesiology

## 2014-03-20 ENCOUNTER — Encounter (HOSPITAL_COMMUNITY)
Admission: RE | Disposition: A | Discharge status: Skilled Nursing Facility | Payer: Self-pay | Source: Ambulatory Visit | Attending: Surgery

## 2014-03-20 DIAGNOSIS — K559 Vascular disorder of intestine, unspecified: Secondary | ICD-10-CM | POA: Diagnosis not present

## 2014-03-20 DIAGNOSIS — J151 Pneumonia due to Pseudomonas: Secondary | ICD-10-CM

## 2014-03-20 DIAGNOSIS — E669 Obesity, unspecified: Secondary | ICD-10-CM | POA: Diagnosis present

## 2014-03-20 DIAGNOSIS — A419 Sepsis, unspecified organism: Secondary | ICD-10-CM | POA: Diagnosis not present

## 2014-03-20 DIAGNOSIS — R066 Hiccough: Secondary | ICD-10-CM | POA: Diagnosis not present

## 2014-03-20 DIAGNOSIS — C20 Malignant neoplasm of rectum: Secondary | ICD-10-CM | POA: Diagnosis present

## 2014-03-20 DIAGNOSIS — R0682 Tachypnea, not elsewhere classified: Secondary | ICD-10-CM | POA: Diagnosis not present

## 2014-03-20 DIAGNOSIS — Z6831 Body mass index (BMI) 31.0-31.9, adult: Secondary | ICD-10-CM

## 2014-03-20 DIAGNOSIS — M129 Arthropathy, unspecified: Secondary | ICD-10-CM | POA: Diagnosis present

## 2014-03-20 DIAGNOSIS — E861 Hypovolemia: Secondary | ICD-10-CM | POA: Diagnosis not present

## 2014-03-20 DIAGNOSIS — Z01812 Encounter for preprocedural laboratory examination: Secondary | ICD-10-CM | POA: Diagnosis not present

## 2014-03-20 DIAGNOSIS — R112 Nausea with vomiting, unspecified: Secondary | ICD-10-CM | POA: Diagnosis not present

## 2014-03-20 DIAGNOSIS — B49 Unspecified mycosis: Secondary | ICD-10-CM | POA: Diagnosis not present

## 2014-03-20 DIAGNOSIS — Z113 Encounter for screening for infections with a predominantly sexual mode of transmission: Secondary | ICD-10-CM

## 2014-03-20 DIAGNOSIS — D62 Acute posthemorrhagic anemia: Secondary | ICD-10-CM | POA: Diagnosis not present

## 2014-03-20 DIAGNOSIS — R32 Unspecified urinary incontinence: Secondary | ICD-10-CM | POA: Diagnosis not present

## 2014-03-20 DIAGNOSIS — H43399 Other vitreous opacities, unspecified eye: Secondary | ICD-10-CM | POA: Diagnosis present

## 2014-03-20 DIAGNOSIS — K560 Paralytic ileus: Secondary | ICD-10-CM | POA: Diagnosis not present

## 2014-03-20 DIAGNOSIS — K219 Gastro-esophageal reflux disease without esophagitis: Secondary | ICD-10-CM

## 2014-03-20 DIAGNOSIS — R000 Tachycardia, unspecified: Secondary | ICD-10-CM | POA: Diagnosis not present

## 2014-03-20 DIAGNOSIS — B377 Candidal sepsis: Secondary | ICD-10-CM | POA: Diagnosis not present

## 2014-03-20 DIAGNOSIS — R6520 Severe sepsis without septic shock: Secondary | ICD-10-CM | POA: Diagnosis not present

## 2014-03-20 DIAGNOSIS — J9601 Acute respiratory failure with hypoxia: Secondary | ICD-10-CM

## 2014-03-20 DIAGNOSIS — Z79899 Other long term (current) drug therapy: Secondary | ICD-10-CM | POA: Diagnosis not present

## 2014-03-20 DIAGNOSIS — Z7982 Long term (current) use of aspirin: Secondary | ICD-10-CM

## 2014-03-20 DIAGNOSIS — Z87891 Personal history of nicotine dependence: Secondary | ICD-10-CM

## 2014-03-20 DIAGNOSIS — R6521 Severe sepsis with septic shock: Secondary | ICD-10-CM | POA: Diagnosis not present

## 2014-03-20 DIAGNOSIS — K55059 Acute (reversible) ischemia of intestine, part and extent unspecified: Secondary | ICD-10-CM | POA: Diagnosis present

## 2014-03-20 DIAGNOSIS — T83091A Other mechanical complication of indwelling urethral catheter, initial encounter: Secondary | ICD-10-CM | POA: Diagnosis not present

## 2014-03-20 DIAGNOSIS — J690 Pneumonitis due to inhalation of food and vomit: Secondary | ICD-10-CM | POA: Diagnosis not present

## 2014-03-20 DIAGNOSIS — F17201 Nicotine dependence, unspecified, in remission: Secondary | ICD-10-CM

## 2014-03-20 DIAGNOSIS — Y846 Urinary catheterization as the cause of abnormal reaction of the patient, or of later complication, without mention of misadventure at the time of the procedure: Secondary | ICD-10-CM | POA: Diagnosis not present

## 2014-03-20 DIAGNOSIS — Z800 Family history of malignant neoplasm of digestive organs: Secondary | ICD-10-CM | POA: Diagnosis not present

## 2014-03-20 DIAGNOSIS — J9600 Acute respiratory failure, unspecified whether with hypoxia or hypercapnia: Secondary | ICD-10-CM | POA: Diagnosis not present

## 2014-03-20 DIAGNOSIS — D72829 Elevated white blood cell count, unspecified: Secondary | ICD-10-CM

## 2014-03-20 DIAGNOSIS — J189 Pneumonia, unspecified organism: Secondary | ICD-10-CM

## 2014-03-20 DIAGNOSIS — J80 Acute respiratory distress syndrome: Secondary | ICD-10-CM

## 2014-03-20 DIAGNOSIS — H43819 Vitreous degeneration, unspecified eye: Secondary | ICD-10-CM | POA: Diagnosis present

## 2014-03-20 DIAGNOSIS — H43812 Vitreous degeneration, left eye: Secondary | ICD-10-CM

## 2014-03-20 LAB — TYPE AND SCREEN
ABO/RH(D): A NEG
ANTIBODY SCREEN: NEGATIVE

## 2014-03-20 LAB — ABO/RH: ABO/RH(D): A NEG

## 2014-03-20 SURGERY — ROBOT ASSISTED LAPAROSCOPIC PARTIAL COLECTOMY
Anesthesia: General | Site: Abdomen

## 2014-03-20 MED ORDER — KETOROLAC TROMETHAMINE 30 MG/ML IJ SOLN: 15.0000 mg | Freq: Once | INTRAMUSCULAR | Status: DC | PRN

## 2014-03-20 MED ORDER — ZOLPIDEM TARTRATE 5 MG PO TABS: 5.0000 mg | ORAL_TABLET | Freq: Every evening | ORAL | Status: DC | PRN

## 2014-03-20 MED ORDER — DEXMEDETOMIDINE HCL IN NACL 200 MCG/50ML IV SOLN
0.2000 ug/kg/h | INTRAVENOUS | Status: DC
  Administered 2014-03-20: .25 ug/kg/h via INTRAVENOUS
  Filled 2014-03-20: qty 50

## 2014-03-20 MED ORDER — HYDROMORPHONE HCL PF 1 MG/ML IJ SOLN
0.5000 mg | INTRAMUSCULAR | Status: DC | PRN
  Administered 2014-03-21 (×2): 1 mg via INTRAVENOUS
  Filled 2014-03-20 (×3): qty 1

## 2014-03-20 MED ORDER — EPHEDRINE SULFATE 50 MG/ML IJ SOLN
INTRAMUSCULAR | Status: DC | PRN
  Administered 2014-03-20 (×2): 5 mg via INTRAVENOUS

## 2014-03-20 MED ORDER — LIDOCAINE HCL (CARDIAC) 20 MG/ML IV SOLN
INTRAVENOUS | Status: AC
  Filled 2014-03-20: qty 5

## 2014-03-20 MED ORDER — FENTANYL CITRATE 0.05 MG/ML IJ SOLN
INTRAMUSCULAR | Status: DC | PRN
  Administered 2014-03-20: 50 ug via INTRAVENOUS
  Administered 2014-03-20: 100 ug via INTRAVENOUS
  Administered 2014-03-20 (×2): 50 ug via INTRAVENOUS
  Administered 2014-03-20 (×2): 100 ug via INTRAVENOUS
  Administered 2014-03-20: 50 ug via INTRAVENOUS

## 2014-03-20 MED ORDER — MIDAZOLAM HCL 5 MG/5ML IJ SOLN
INTRAMUSCULAR | Status: DC | PRN
  Administered 2014-03-20: 2 mg via INTRAVENOUS

## 2014-03-20 MED ORDER — ALUM & MAG HYDROXIDE-SIMETH 200-200-20 MG/5ML PO SUSP
30.0000 mL | Freq: Four times a day (QID) | ORAL | Status: DC | PRN
  Administered 2014-03-21 (×2): 30 mL via ORAL
  Filled 2014-03-20 (×2): qty 30

## 2014-03-20 MED ORDER — LIP MEDEX EX OINT
1.0000 "application " | TOPICAL_OINTMENT | Freq: Two times a day (BID) | CUTANEOUS | Status: DC
  Administered 2014-03-20 – 2014-04-02 (×20): 1 via TOPICAL
  Filled 2014-03-20 (×2): qty 7

## 2014-03-20 MED ORDER — LACTATED RINGERS IR SOLN
Status: DC | PRN
  Administered 2014-03-20: 1000 mL

## 2014-03-20 MED ORDER — CEFOTETAN DISODIUM-DEXTROSE 2-2.08 GM-% IV SOLR
INTRAVENOUS | Status: AC
Start: 2014-03-20 — End: 2014-03-20
  Filled 2014-03-20: qty 50

## 2014-03-20 MED ORDER — ACETAMINOPHEN 10 MG/ML IV SOLN
1000.0000 mg | Freq: Once | INTRAVENOUS | Status: AC
  Administered 2014-03-20: 1000 mg via INTRAVENOUS
  Filled 2014-03-20: qty 100

## 2014-03-20 MED ORDER — LACTATED RINGERS IV BOLUS (SEPSIS): 1000.0000 mL | Freq: Three times a day (TID) | INTRAVENOUS | Status: AC | PRN

## 2014-03-20 MED ORDER — SODIUM CHLORIDE 0.9 % IV SOLN
INTRAVENOUS | Status: DC | PRN
  Administered 2014-03-20: 12:00:00

## 2014-03-20 MED ORDER — SODIUM CHLORIDE 0.9 % IJ SOLN
INTRAMUSCULAR | Status: AC
  Filled 2014-03-20: qty 3

## 2014-03-20 MED ORDER — ONDANSETRON HCL 4 MG PO TABS: 4.0000 mg | ORAL_TABLET | Freq: Three times a day (TID) | ORAL | Status: DC | PRN

## 2014-03-20 MED ORDER — CEFOTETAN DISODIUM-DEXTROSE 2-2.08 GM-% IV SOLR
INTRAVENOUS | Status: AC
  Filled 2014-03-20: qty 50

## 2014-03-20 MED ORDER — OXYCODONE HCL 5 MG PO TABS: 5.0000 mg | ORAL_TABLET | ORAL | Status: DC | PRN

## 2014-03-20 MED ORDER — BUPIVACAINE 0.25 % ON-Q PUMP DUAL CATH 300 ML
300.0000 mL | INJECTION | Status: DC
  Filled 2014-03-20: qty 300

## 2014-03-20 MED ORDER — LIDOCAINE HCL (CARDIAC) 20 MG/ML IV SOLN
INTRAVENOUS | Status: DC | PRN
  Administered 2014-03-20: 60 mg via INTRAVENOUS

## 2014-03-20 MED ORDER — PROPOFOL 10 MG/ML IV BOLUS
INTRAVENOUS | Status: AC
  Filled 2014-03-20: qty 20

## 2014-03-20 MED ORDER — PROMETHAZINE HCL 25 MG/ML IJ SOLN: 6.2500 mg | INTRAMUSCULAR | Status: DC | PRN

## 2014-03-20 MED ORDER — DIPHENHYDRAMINE HCL 50 MG/ML IJ SOLN: 12.5000 mg | Freq: Four times a day (QID) | INTRAMUSCULAR | Status: DC | PRN

## 2014-03-20 MED ORDER — SODIUM CHLORIDE 0.9 % IJ SOLN
INTRAMUSCULAR | Status: AC
  Filled 2014-03-20: qty 10

## 2014-03-20 MED ORDER — ONDANSETRON HCL 4 MG/2ML IJ SOLN
INTRAMUSCULAR | Status: DC | PRN
  Administered 2014-03-20: 4 mg via INTRAVENOUS

## 2014-03-20 MED ORDER — GLYCOPYRROLATE 0.2 MG/ML IJ SOLN
INTRAMUSCULAR | Status: DC | PRN
  Administered 2014-03-20: 0.6 mg via INTRAVENOUS

## 2014-03-20 MED ORDER — ONDANSETRON HCL 4 MG/2ML IJ SOLN
4.0000 mg | Freq: Four times a day (QID) | INTRAMUSCULAR | Status: DC | PRN
  Administered 2014-03-20 – 2014-03-31 (×4): 4 mg via INTRAVENOUS
  Filled 2014-03-20 (×4): qty 2

## 2014-03-20 MED ORDER — FENTANYL CITRATE 0.05 MG/ML IJ SOLN
INTRAMUSCULAR | Status: AC
  Filled 2014-03-20: qty 5

## 2014-03-20 MED ORDER — HYDROMORPHONE HCL PF 1 MG/ML IJ SOLN: 0.2500 mg | INTRAMUSCULAR | Status: DC | PRN

## 2014-03-20 MED ORDER — METHOCARBAMOL 1000 MG/10ML IJ SOLN
500.0000 mg | Freq: Four times a day (QID) | INTRAVENOUS | Status: DC | PRN
  Filled 2014-03-20: qty 10

## 2014-03-20 MED ORDER — LABETALOL HCL 5 MG/ML IV SOLN
INTRAVENOUS | Status: DC | PRN
  Administered 2014-03-20: 5 mg via INTRAVENOUS

## 2014-03-20 MED ORDER — PHENOL 1.4 % MT LIQD: 2.0000 | OROMUCOSAL | Status: DC | PRN

## 2014-03-20 MED ORDER — ONDANSETRON HCL 4 MG/2ML IJ SOLN
INTRAMUSCULAR | Status: AC
  Filled 2014-03-20: qty 2

## 2014-03-20 MED ORDER — HYDRALAZINE HCL 20 MG/ML IJ SOLN
INTRAMUSCULAR | Status: AC
  Filled 2014-03-20: qty 1

## 2014-03-20 MED ORDER — ALVIMOPAN 12 MG PO CAPS
12.0000 mg | ORAL_CAPSULE | Freq: Two times a day (BID) | ORAL | Status: DC
  Administered 2014-03-21 – 2014-03-24 (×7): 12 mg via ORAL
  Filled 2014-03-20 (×10): qty 1

## 2014-03-20 MED ORDER — MAGIC MOUTHWASH
15.0000 mL | Freq: Four times a day (QID) | ORAL | Status: DC | PRN
  Filled 2014-03-20: qty 15

## 2014-03-20 MED ORDER — MENTHOL 3 MG MT LOZG: 1.0000 | LOZENGE | OROMUCOSAL | Status: DC | PRN

## 2014-03-20 MED ORDER — ONDANSETRON 8 MG/NS 50 ML IVPB
8.0000 mg | Freq: Four times a day (QID) | INTRAVENOUS | Status: DC | PRN
Start: 2014-03-20 — End: 2014-04-02
  Filled 2014-03-20: qty 8

## 2014-03-20 MED ORDER — ROCURONIUM BROMIDE 100 MG/10ML IV SOLN
INTRAVENOUS | Status: AC
  Filled 2014-03-20: qty 1

## 2014-03-20 MED ORDER — HYDROMORPHONE HCL PF 2 MG/ML IJ SOLN
INTRAMUSCULAR | Status: AC
Start: 2014-03-20 — End: 2014-03-20
  Filled 2014-03-20: qty 1

## 2014-03-20 MED ORDER — PROPOFOL 10 MG/ML IV BOLUS
INTRAVENOUS | Status: DC | PRN
  Administered 2014-03-20: 150 mg via INTRAVENOUS

## 2014-03-20 MED ORDER — DEXTROSE 5 % IV SOLN
2.0000 g | INTRAVENOUS | Status: AC
  Administered 2014-03-20 (×2): 2 g via INTRAVENOUS

## 2014-03-20 MED ORDER — HEPARIN SODIUM (PORCINE) 5000 UNIT/ML IJ SOLN
5000.0000 [IU] | Freq: Three times a day (TID) | INTRAMUSCULAR | Status: DC
  Administered 2014-03-21 – 2014-04-02 (×36): 5000 [IU] via SUBCUTANEOUS
  Filled 2014-03-20 (×42): qty 1

## 2014-03-20 MED ORDER — BUPIVACAINE-EPINEPHRINE 0.25% -1:200000 IJ SOLN
INTRAMUSCULAR | Status: DC | PRN
  Administered 2014-03-20: 60 mL

## 2014-03-20 MED ORDER — HEPARIN SODIUM (PORCINE) 5000 UNIT/ML IJ SOLN
5000.0000 [IU] | Freq: Once | INTRAMUSCULAR | Status: AC
Start: 2014-03-20 — End: 2014-03-20
  Administered 2014-03-20: 5000 [IU] via SUBCUTANEOUS
  Filled 2014-03-20: qty 1

## 2014-03-20 MED ORDER — KCL IN DEXTROSE-NACL 40-5-0.9 MEQ/L-%-% IV SOLN
INTRAVENOUS | Status: DC
  Administered 2014-03-20 – 2014-03-22 (×3): via INTRAVENOUS
  Administered 2014-03-22: 50 mL/h via INTRAVENOUS
  Administered 2014-03-22 – 2014-03-23 (×2): via INTRAVENOUS
  Filled 2014-03-20 (×9): qty 1000

## 2014-03-20 MED ORDER — BUPIVACAINE LIPOSOME 1.3 % IJ SUSP
20.0000 mL | Freq: Once | INTRAMUSCULAR | Status: AC
  Administered 2014-03-20: 20 mL
  Filled 2014-03-20: qty 20

## 2014-03-20 MED ORDER — PROMETHAZINE HCL 25 MG/ML IJ SOLN
6.2500 mg | Freq: Four times a day (QID) | INTRAMUSCULAR | Status: DC | PRN
  Administered 2014-03-21 – 2014-03-30 (×2): 6.25 mg via INTRAVENOUS
  Filled 2014-03-20 (×3): qty 1

## 2014-03-20 MED ORDER — DEXTROSE 5 % IV SOLN
2.0000 g | Freq: Two times a day (BID) | INTRAVENOUS | Status: AC
  Administered 2014-03-20: 2 g via INTRAVENOUS
  Filled 2014-03-20: qty 2

## 2014-03-20 MED ORDER — HYDROMORPHONE HCL PF 1 MG/ML IJ SOLN
INTRAMUSCULAR | Status: DC | PRN
  Administered 2014-03-20 (×3): 0.5 mg via INTRAVENOUS

## 2014-03-20 MED ORDER — DIPHENHYDRAMINE HCL 12.5 MG/5ML PO ELIX: 12.5000 mg | ORAL_SOLUTION | Freq: Four times a day (QID) | ORAL | Status: DC | PRN

## 2014-03-20 MED ORDER — SACCHAROMYCES BOULARDII 250 MG PO CAPS
250.0000 mg | ORAL_CAPSULE | Freq: Two times a day (BID) | ORAL | Status: DC
  Administered 2014-03-20 – 2014-03-23 (×7): 250 mg via ORAL
  Filled 2014-03-20 (×9): qty 1

## 2014-03-20 MED ORDER — BUPIVACAINE-EPINEPHRINE (PF) 0.25% -1:200000 IJ SOLN
INTRAMUSCULAR | Status: AC
  Filled 2014-03-20: qty 60

## 2014-03-20 MED ORDER — NEOSTIGMINE METHYLSULFATE 10 MG/10ML IV SOLN
INTRAVENOUS | Status: DC | PRN
  Administered 2014-03-20: 4 mg via INTRAVENOUS

## 2014-03-20 MED ORDER — SPOT INK MARKER SYRINGE KIT
PACK | SUBMUCOSAL | Status: AC
  Filled 2014-03-20: qty 5

## 2014-03-20 MED ORDER — ALVIMOPAN 12 MG PO CAPS
12.0000 mg | ORAL_CAPSULE | Freq: Once | ORAL | Status: AC
  Administered 2014-03-20: 12 mg via ORAL
  Filled 2014-03-20: qty 1

## 2014-03-20 MED ORDER — LACTATED RINGERS IV SOLN
INTRAVENOUS | Status: DC | PRN
  Administered 2014-03-20 (×2): via INTRAVENOUS

## 2014-03-20 MED ORDER — HYDRALAZINE HCL 20 MG/ML IJ SOLN: 5.0000 mg | Freq: Four times a day (QID) | INTRAMUSCULAR | Status: DC | PRN

## 2014-03-20 MED ORDER — 0.9 % SODIUM CHLORIDE (POUR BTL) OPTIME
TOPICAL | Status: DC | PRN
  Administered 2014-03-20: 2000 mL

## 2014-03-20 MED ORDER — METOPROLOL TARTRATE 1 MG/ML IV SOLN
5.0000 mg | Freq: Four times a day (QID) | INTRAVENOUS | Status: DC | PRN
  Administered 2014-03-21: 5 mg via INTRAVENOUS
  Filled 2014-03-20: qty 5

## 2014-03-20 MED ORDER — HYDRALAZINE HCL 20 MG/ML IJ SOLN
INTRAMUSCULAR | Status: DC | PRN
  Administered 2014-03-20 (×2): 5 mg via INTRAVENOUS

## 2014-03-20 MED ORDER — ACETAMINOPHEN 500 MG PO TABS
1000.0000 mg | ORAL_TABLET | Freq: Three times a day (TID) | ORAL | Status: DC
  Administered 2014-03-20 – 2014-03-23 (×11): 1000 mg via ORAL
  Filled 2014-03-20 (×16): qty 2

## 2014-03-20 MED ORDER — LABETALOL HCL 5 MG/ML IV SOLN
INTRAVENOUS | Status: AC
  Filled 2014-03-20: qty 4

## 2014-03-20 MED ORDER — ROCURONIUM BROMIDE 100 MG/10ML IV SOLN
INTRAVENOUS | Status: DC | PRN
  Administered 2014-03-20 (×2): 20 mg via INTRAVENOUS
  Administered 2014-03-20: 10 mg via INTRAVENOUS
  Administered 2014-03-20: 50 mg via INTRAVENOUS
  Administered 2014-03-20 (×3): 10 mg via INTRAVENOUS
  Administered 2014-03-20: 20 mg via INTRAVENOUS

## 2014-03-20 MED ORDER — EPHEDRINE SULFATE 50 MG/ML IJ SOLN
INTRAMUSCULAR | Status: AC
Start: 2014-03-20 — End: 2014-03-20
  Filled 2014-03-20: qty 1

## 2014-03-20 MED ORDER — LACTATED RINGERS IV SOLN
INTRAVENOUS | Status: DC | PRN
  Administered 2014-03-20 (×3): via INTRAVENOUS

## 2014-03-20 MED ORDER — FENTANYL CITRATE 0.05 MG/ML IJ SOLN
INTRAMUSCULAR | Status: AC
Start: 2014-03-20 — End: 2014-03-20
  Filled 2014-03-20: qty 5

## 2014-03-20 MED ORDER — MIDAZOLAM HCL 2 MG/2ML IJ SOLN
INTRAMUSCULAR | Status: AC
  Filled 2014-03-20: qty 2

## 2014-03-20 SURGICAL SUPPLY — 116 items
APPLIER CLIP 5 13 M/L LIGAMAX5 (MISCELLANEOUS)
APPLIER CLIP ROT 10 11.4 M/L (STAPLE)
BAG URINE DRAINAGE (UROLOGICAL SUPPLIES) ×2 IMPLANT
BLADE EXTENDED COATED 6.5IN (ELECTRODE) IMPLANT
BLADE HEX COATED 2.75 (ELECTRODE) ×4 IMPLANT
BLADE SURG SZ10 CARB STEEL (BLADE) IMPLANT
BLADE SURG SZ11 CARB STEEL (BLADE) ×2 IMPLANT
CABLE HIGH FREQUENCY MONO STRZ (ELECTRODE) IMPLANT
CANISTER SUCTION 2500CC (MISCELLANEOUS) IMPLANT
CATH FOLEY 2WAY SLVR 30CC 24FR (CATHETERS) IMPLANT
CATH KIT ON Q 7.5IN SLV (PAIN MANAGEMENT) IMPLANT
CATH ROBINSON RED A/P 16FR (CATHETERS) IMPLANT
CELLS DAT CNTRL 66122 CELL SVR (MISCELLANEOUS) IMPLANT
CHLORAPREP W/TINT 26ML (MISCELLANEOUS) ×2 IMPLANT
CLAMP POUCH DRAINAGE QUIET (OSTOMY) ×2 IMPLANT
CLIP APPLIE 5 13 M/L LIGAMAX5 (MISCELLANEOUS) IMPLANT
CLIP APPLIE ROT 10 11.4 M/L (STAPLE) IMPLANT
CLIP LIGATING HEM O LOK PURPLE (MISCELLANEOUS) IMPLANT
CLIP LIGATING HEMO O LOK GREEN (MISCELLANEOUS) IMPLANT
CLIP LIGATING HEMOLOK MED (MISCELLANEOUS) IMPLANT
COVER MAYO STAND STRL (DRAPES) ×2 IMPLANT
COVER TIP SHEARS 8 DVNC (MISCELLANEOUS) ×1 IMPLANT
COVER TIP SHEARS 8MM DA VINCI (MISCELLANEOUS) ×1
DECANTER SPIKE VIAL GLASS SM (MISCELLANEOUS) IMPLANT
DEVICE TROCAR PUNCTURE CLOSURE (ENDOMECHANICALS) IMPLANT
DRAIN CHANNEL 19F RND (DRAIN) ×2 IMPLANT
DRAPE ARM DVNC X/XI (DISPOSABLE) ×4 IMPLANT
DRAPE CAMERA CLOSED 9X96 (DRAPES) ×2 IMPLANT
DRAPE COLUMN DVNC XI (DISPOSABLE) ×1 IMPLANT
DRAPE DA VINCI XI ARM (DISPOSABLE) ×4
DRAPE DA VINCI XI COLUMN (DISPOSABLE) ×1
DRAPE LG THREE QUARTER DISP (DRAPES) ×2 IMPLANT
DRAPE SURG IRRIG POUCH 19X23 (DRAPES) ×2 IMPLANT
DRAPE UTILITY XL STRL (DRAPES) ×2 IMPLANT
DRAPE WARM FLUID 44X44 (DRAPE) ×2 IMPLANT
DRSG OPSITE POSTOP 4X10 (GAUZE/BANDAGES/DRESSINGS) IMPLANT
DRSG OPSITE POSTOP 4X6 (GAUZE/BANDAGES/DRESSINGS) IMPLANT
DRSG OPSITE POSTOP 4X8 (GAUZE/BANDAGES/DRESSINGS) IMPLANT
DRSG TEGADERM 2-3/8X2-3/4 SM (GAUZE/BANDAGES/DRESSINGS) ×2 IMPLANT
DRSG TEGADERM 4X4.75 (GAUZE/BANDAGES/DRESSINGS) ×2 IMPLANT
DRSG TEGADERM 6X8 (GAUZE/BANDAGES/DRESSINGS) ×2 IMPLANT
ELECT REM PT RETURN 9FT ADLT (ELECTROSURGICAL) ×2
ELECTRODE REM PT RTRN 9FT ADLT (ELECTROSURGICAL) ×1 IMPLANT
ENDOLOOP SUT PDS II  0 18 (SUTURE)
ENDOLOOP SUT PDS II 0 18 (SUTURE) IMPLANT
EVACUATOR SILICONE 100CC (DRAIN) ×2 IMPLANT
GAUZE SPONGE 2X2 8PLY STRL LF (GAUZE/BANDAGES/DRESSINGS) ×1 IMPLANT
GAUZE SPONGE 4X4 12PLY STRL (GAUZE/BANDAGES/DRESSINGS) IMPLANT
GLOVE ECLIPSE 8.0 STRL XLNG CF (GLOVE) ×6 IMPLANT
GLOVE INDICATOR 8.0 STRL GRN (GLOVE) ×6 IMPLANT
GOWN STRL REUS W/TWL 2XL LVL3 (GOWN DISPOSABLE) ×6 IMPLANT
GOWN STRL REUS W/TWL XL LVL3 (GOWN DISPOSABLE) ×20 IMPLANT
HOLDER FOLEY CATH W/STRAP (MISCELLANEOUS) ×2 IMPLANT
KIT BASIN OR (CUSTOM PROCEDURE TRAY) ×4 IMPLANT
LEGGING LITHOTOMY PAIR STRL (DRAPES) ×4 IMPLANT
LUBRICANT JELLY K Y 4OZ (MISCELLANEOUS) IMPLANT
MANIFOLD NEPTUNE II (INSTRUMENTS) ×2 IMPLANT
NEEDLE HYPO 22GX1.5 SAFETY (NEEDLE) ×2 IMPLANT
NEEDLE INSUFFLATION 14GA 120MM (NEEDLE) ×2 IMPLANT
PACK CARDIOVASCULAR III (CUSTOM PROCEDURE TRAY) ×2 IMPLANT
PACK GENERAL/GYN (CUSTOM PROCEDURE TRAY) ×2 IMPLANT
PENCIL BUTTON HOLSTER BLD 10FT (ELECTRODE) ×2 IMPLANT
POUCH OSTOMY 1 PC DRNBL  2 1/2 (OSTOMY) ×1 IMPLANT
POUCH OSTOMY 2 1/2 (OSTOMY) ×1
PUMP PAIN ON-Q (MISCELLANEOUS) IMPLANT
RETRACTOR LONE STAR DISPOSABLE (INSTRUMENTS) ×2 IMPLANT
RETRACTOR STAY HOOK 5MM (MISCELLANEOUS) ×2 IMPLANT
RTRCTR WOUND ALEXIS 18CM MED (MISCELLANEOUS)
SCISSORS LAP 5X35 DISP (ENDOMECHANICALS) IMPLANT
SCRUB PCMX 4 OZ (MISCELLANEOUS) ×2 IMPLANT
SEAL CANN UNIV 5-8 DVNC XI (MISCELLANEOUS) ×4 IMPLANT
SEAL XI 5MM-8MM UNIVERSAL (MISCELLANEOUS) ×4
SEALER TISSUE G2 STRG ARTC 35C (ENDOMECHANICALS) IMPLANT
SEALER VESSEL DA VINCI XI (MISCELLANEOUS) ×1
SEALER VESSEL EXT DVNC XI (MISCELLANEOUS) ×1 IMPLANT
SET IRRIG TUBING LAPAROSCOPIC (IRRIGATION / IRRIGATOR) ×2 IMPLANT
SLEEVE ENDOPATH XCEL 5M (ENDOMECHANICALS) ×2 IMPLANT
SLEEVE XCEL OPT CAN 5 100 (ENDOMECHANICALS) IMPLANT
SOLUTION ELECTROLUBE (MISCELLANEOUS) IMPLANT
SPONGE GAUZE 2X2 STER 10/PKG (GAUZE/BANDAGES/DRESSINGS) ×1
SPONGE LAP 18X18 X RAY DECT (DISPOSABLE) IMPLANT
STAPLER PROXIMATE 75MM BLUE (STAPLE) ×2 IMPLANT
SUCTION POOLE TIP (SUCTIONS) ×2 IMPLANT
SUT MNCRL AB 4-0 PS2 18 (SUTURE) ×2 IMPLANT
SUT PDS AB 1 CTX 36 (SUTURE) IMPLANT
SUT PDS AB 1 TP1 96 (SUTURE) IMPLANT
SUT PDS AB 2-0 CT2 27 (SUTURE) ×8 IMPLANT
SUT PROLENE 0 CT 2 (SUTURE) IMPLANT
SUT PROLENE 0 SH 30 (SUTURE) ×2 IMPLANT
SUT PROLENE 2 0 CT2 30 (SUTURE) ×2 IMPLANT
SUT SILK 2 0 (SUTURE) ×1
SUT SILK 2 0 SH CR/8 (SUTURE) ×2 IMPLANT
SUT SILK 2-0 18XBRD TIE 12 (SUTURE) ×1 IMPLANT
SUT SILK 3 0 (SUTURE) ×1
SUT SILK 3 0 SH CR/8 (SUTURE) ×2 IMPLANT
SUT SILK 3-0 18XBRD TIE 12 (SUTURE) ×1 IMPLANT
SUT VIC AB 2-0 SH 27 (SUTURE) ×4
SUT VIC AB 2-0 SH 27X BRD (SUTURE) ×4 IMPLANT
SUT VIC AB 3-0 SH 18 (SUTURE) ×6 IMPLANT
SUT VICRYL 0 TIES 12 18 (SUTURE) ×2 IMPLANT
SUT VICRYL 0 UR6 27IN ABS (SUTURE) IMPLANT
SYR BULB IRRIGATION 50ML (SYRINGE) IMPLANT
SYRINGE 12CC LL (MISCELLANEOUS) ×2 IMPLANT
SYRINGE 20CC LL (MISCELLANEOUS) ×6 IMPLANT
SYS LAPSCP GELPORT 120MM (MISCELLANEOUS)
SYSTEM LAPSCP GELPORT 120MM (MISCELLANEOUS) IMPLANT
TAPE UMBILICAL COTTON 1/8X30 (MISCELLANEOUS) IMPLANT
TOWEL OR 17X26 10 PK STRL BLUE (TOWEL DISPOSABLE) ×4 IMPLANT
TOWEL OR NON WOVEN STRL DISP B (DISPOSABLE) ×4 IMPLANT
TRAY FOLEY CATH 14FRSI W/METER (CATHETERS) IMPLANT
TRAY FOLEY CATH 16FRSI W/METER (SET/KITS/TRAYS/PACK) ×2 IMPLANT
TROCAR BLADELESS OPT 5 100 (ENDOMECHANICALS) ×2 IMPLANT
TUBING CONNECTING 10 (TUBING) IMPLANT
TUBING FILTER THERMOFLATOR (ELECTROSURGICAL) ×2 IMPLANT
TUNNELER SHEATH ON-Q 16GX12 DP (PAIN MANAGEMENT) IMPLANT
YANKAUER SUCT BULB TIP 10FT TU (MISCELLANEOUS) ×2 IMPLANT

## 2014-03-20 NOTE — H&P (Signed)
[name] [place], [place] [address] Phone: [phone] FAX: [place]  1956/07/09 [id number]  CARE TEAM:  PCP: Rubbie Battiest, MD  Outpatient Care Team: Patient Care Team: [name], MD as PCP - General (Family Medicine) Satira Sark, MD as Consulting Physician (Cardiology) Danie Binder, MD as Consulting Physician (Gastroenterology) Milus Banister, MD as Attending Physician (Gastroenterology) Shann Medal, MD as Consulting Physician (General Surgery) Farrel Gobble, MD as Consulting Physician (Medical Oncology) Adin Hector, MD as Consulting Physician (General Surgery) Adin Hector, MD as Consulting Physician (General Surgery)  Inpatient Treatment Team: Treatment Team: Attending Provider: Adin Hector, MD  Pleasant active male. Had a sister died of colon cancer age 50. Had episode of rectal bleeding and pain. Underwent colonoscopy. Cannot have mass in the distal rectum. Biopsy concerning for adenocarcinoma. Endoscopic ultrasound stages as T2N0. Surgical consultation requested. He should wish to have surgery in [place]. Because of my experience in low rectal cancers, Dr. Lucia Gaskins requested a second opinion in office concerning this patient.   Patient denies any incontinence to flatus or stool. Normally as I saw about one a day. No personal nor family history of inflammatory bowel disease, irritable bowel syndrome, allergy such as Celiac Sprue, dietary/dairy problems, colitis, ulcers nor gastritis. No recent sick contacts/gastroenteritis. No travel outside the country. No changes in diet. No dysphagia to solids or liquids. No significant heartburn or reflux. No hematemesis, coffee ground emesis. No evidence of prior gastric/peptic ulceration.   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 Very active. He cannot bend his left knee much. It tends to be straight. Has not smoked  tobacco in since last year. Had episode of atypical chest pain. Negative cardiac work up including normal Myoview 2 months ago.  No new events.   Past Medical History  Diagnosis Date  . GERD (gastroesophageal reflux disease)   . Shortness of breath     due to weight gain poststopping smokeing  . Arthritis     Past Surgical History  Procedure Laterality Date  . Colonoscopy N/A 12/22/2013    Procedure: COLONOSCOPY;  Surgeon: Danie Binder, MD;  Location: [facility];  Service: Endoscopy;  Laterality: N/A;  8:30  . Esophagogastroduodenoscopy N/A 12/22/2013    Procedure: ESOPHAGOGASTRODUODENOSCOPY (EGD);  Surgeon: Danie Binder, MD;  Location: [facility] SU[facility];  Service: Endoscopy;  Laterality: N/A;  . Eus N/A 01/01/2014    Procedure: LOWER ENDOSCOPIC ULTRASOUND (EUS);  Surgeon: Milus Banister, MD;  Location: [facility];  Service: Endoscopy;  Laterality: N/A;  . Hip fracture surgery Left age 81 on 04-06-1970  . Eye reattachment and ear reattachment  Right 1971    History   Social History  . Marital Status: Married    Spouse Name: N/A    Number of Children: N/A  . Years of Education: N/A   Occupational History  . Not on file.   Social History Main Topics  . Smoking status: Former Smoker -- 0.00 packs/day for 15 years    Types: Cigars    Quit date: 09/10/2011  . Smokeless tobacco: Never Used  . Alcohol Use: No     Comment: quit years ago  . Drug Use: No  . Sexual Activity: Not on file   Other Topics Concern  . 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  Family History  Problem Relation Age of Onset  . Colon polyps Neg Hx   . Colon cancer Sister     Current Facility-Administered Medications  Medication Dose Route Frequency Provider Last Rate Last Dose  . bupivacaine 0.25 % ON-Q pump DUAL CATH 300 mL  300 mL Other Continuous Adin Hector, MD      . cefoTEtan (CEFOTAN) 2 g in dextrose 5 % 50 mL IVPB  2 g Intravenous On Call to  OR Adin Hector, MD      . clindamycin (CLEOCIN) 900 mg, gentamicin (GARAMYCIN) 240 mg in sodium chloride 0.9 % 1,000 mL for intraperitoneal lavage   Intraperitoneal To OR Adin Hector, MD       Facility-Administered Medications Ordered in Other Encounters  Medication Dose Route Frequency Provider Last Rate Last Dose  . lactated ringers infusion    Continuous PRN Glory Buff, CRNA      . lactated ringers infusion    Continuous PRN Glory Buff, CRNA         No Known Allergies  ROS: Constitutional:  No fevers, chills, sweats.  Weight stable Eyes:  No vision changes, No discharge HENT:  No sore throats, nasal drainage Lymph: No neck swelling, No bruising easily Pulmonary:  No cough, productive sputum CV: No orthopnea, PND  No exertional chest/neck/shoulder/arm pain. GI: No personal nor family history of inflammatory bowel disease, irritable bowel syndrome, allergy such as Celiac Sprue, dietary/dairy problems, colitis, ulcers nor gastritis.  No recent sick contacts/gastroenteritis.  No travel outside the country.  No changes in diet. Renal: No UTIs, No hematuria Genital:  No drainage, bleeding, masses Musculoskeletal: No severe joint pain.  Good ROM major joints Skin:  No sores or lesions.  No rashes Heme/Lymph:  No easy bleeding.  No swollen lymph nodes Neuro: No focal weakness/numbness.  No seizures Psych: No suicidal ideation.  No hallucinations  BP 144/95  Pulse 78  Temp(Src) 97.9 F (36.6 C) (Oral)  Resp 18  SpO2 95%  Physical Exam: General: Pt awake/alert/oriented x4 in no major acute distress Eyes: PERRL, normal EOM. Sclera nonicteric Neuro: CN II-XII intact w/o focal sensory/motor deficits. Lymph: No head/neck/groin lymphadenopathy Psych:  No delerium/psychosis/paranoia HENT: Normocephalic, Mucus membranes moist.  No thrush Neck: Supple, No tracheal deviation Chest: No pain.  Good respiratory excursion. CV:  Pulses intact.  Regular  rhythm Abdomen: Soft, Nondistended.  Nontender.  No incarcerated hernias. Ext:  SCDs BLE.  No significant edema.  No cyanosis Skin: No petechiae / purpurea.  No major sores Musculoskeletal: No severe joint pain.  Good ROM major joints   Results:   Labs: Results for orders placed during the hospital encounter of 03/20/14 (from the past 48 hour(s))  TYPE AND SCREEN     Status: None   Collection Time    03/20/14  5:50 AM      Result Value Ref Range   ABO/RH(D) A NEG     Antibody Screen NEG     Sample Expiration 03/23/2014    ABO/RH     Status: None   Collection Time    03/20/14  6:00 AM      Result Value Ref Range   ABO/RH(D) A NEG      Imaging / Studies: No results found.  Medications / Allergies: per chart  Antibiotics: Anti-infectives   Start     Dose/Rate Route Frequency Ordered Stop   03/20/14 0518  cefoTEtan (CEFOTAN) 2 g in dextrose 5 % 50 mL IVPB  2 g 100 mL/hr over 30 Minutes Intravenous On call to O.R. 03/20/14 0518 03/21/14 0559   03/20/14 0000  clindamycin (CLEOCIN) 900 mg, gentamicin (GARAMYCIN) 240 mg in sodium chloride 0.9 % 1,000 mL for intraperitoneal lavage    Comments:  Pharmacy may adjust dosing strength, schedule, rate of infusion, etc as needed to optimize therapy    Intraperitoneal To Surgery 03/19/14 1341 03/21/14 0000   03/19/14 1345  clindamycin (CLEOCIN) 900 mg, gentamicin (GARAMYCIN) 240 mg in sodium chloride 0.9 % 1,000 mL for intraperitoneal lavage  Status:  Discontinued    Comments:  Pharmacy may adjust dosing strength, schedule, rate of infusion, etc as needed to optimize therapy   1 application Intraperitoneal To Surgery 03/19/14 1339 03/19/14 1341      Assessment  [name]  58 y.o. male  Day of Surgery  Procedure(s): ROBOT ASSISTED low anterior resection diverting loop ileostomy possible coloanal handsewn anastomosis possible ostomy LAPAROSCOPIC possible open  PARTIAL COLECTOMY  Problem List:  Active Problems:   * No  active hospital problems. *   Very distal T2 rectal cancer   Plan:  I think he would benefit from resection of this cancer.   It seems stage I. I think there is enough room that sphincter preservation can be done without sacrificing resection margins. I agree that neoadjuvant chemoradiation therapy will not be needed. Challenge will be to figure out if we can safely do a stapled coloanal baker-type anastomosis or if a handsewn coloanal anastomosis will be needed. Plan a [place] in room. Also will need to have left leg straight so probably split leg on the left and then on the right side. We can try split length on both sides and see if that works   The need for diverting loop ileostomy is a definite:  The anatomy & physiology of the digestive tract was discussed. The pathophysiology was discussed. Natural history risks without surgery was discussed. I worked to give an overview of the disease and the frequent need to have multispecialty involvement. I feel the risks of no intervention will lead to serious problems that outweigh the operative risks; therefore, I recommended a partial proctocolectomy to remove the pathology. Minimally Invasive (Robotic/Laparoscopic) & open techniques were discussed. We will work to preserve anal & pelvic floor function without sacrificing cure.   Risks such as bleeding, infection, abscess, leak, reoperation, possible ostomy, hernia, heart attack, death, and other risks were discussed. I noted a good likelihood this will help address the problem. Goals of post-operative recovery were discussed as well. We will work to minimize complications. An educational handout on the pathology was given as well. Questions were answered.    -VTE prophylaxis- SCDs, etc -mobilize as tolerated to help recovery    Adin Hector, M.D., F.A.C.S. Gastrointestinal and Minimally Invasive Surgery [facility] [address], [place], [place]  [address] [phone] Main / Paging   03/20/2014  Note: Portions of this report may have been transcribed using voice recognition software. Every effort was made to ensure accuracy; however, inadvertent computerized transcription errors may be present.   Any transcriptional errors that result from this process are unintentional.

## 2014-03-20 NOTE — Op Note (Addendum)
03/20/2014  1:34 PM  PATIENT:  [name]  58 y.o. male  Patient Care Team: [name], MD as PCP - General (Family Medicine) Satira Sark, MD as Consulting Physician (Cardiology) Danie Binder, MD as Consulting Physician (Gastroenterology) Milus Banister, MD as Attending Physician (Gastroenterology) Shann Medal, MD as Consulting Physician (General Surgery) Farrel Gobble, MD as Consulting Physician (Medical Oncology) Adin Hector, MD as Consulting Physician (General Surgery) Adin Hector, MD as Consulting Physician (General Surgery)  PRE-OPERATIVE DIAGNOSIS:  VERY LOW RECTAL CANCER   POST-OPERATIVE DIAGNOSIS:  VERY LOW RECTAL CANCER  PROCEDURE:    ROBOTIC ASSISTED low anterior resection TRANSANAL TRANSABDOMINAL TME WITH HANDSEWN COLOANAL ANASTOMOSIS splenic flexure mobilization diverting loop ileostomy   Surgeon(s): Adin Hector, MD Leighton Ruff, MD - Asst  ANESTHESIA:   local and general  EBL:  Total I/O In: 3350 [I.V.:3250; IV Piggyback:100] Out: 525 [Urine:225; Blood:300]  Delay start of Pharmacological VTE agent (>24hrs) due to surgical blood loss or risk of bleeding:  no  DRAINS: (19Fr) Blake drain(s) in the pelvis   SPECIMEN:  Source of Specimen:  RECTOSIGMOID COLON WITH VERY DISTAL RECTAL CANCER  DISPOSITION OF SPECIMEN:  PATHOLOGY  COUNTS:  YES  PLAN OF CARE: Admit to inpatient   PATIENT DISPOSITION:  PACU - hemodynamically stable.  INDICATION:    Pleasant gentleman found to have a very distal rectal cancer.  I recommended segmental resection:  The anatomy & physiology of the digestive tract was discussed.  The pathophysiology was discussed.  Natural history risks without surgery was discussed.   I worked to give an overview of the disease and the frequent need to have multispecialty involvement.  I feel the risks of no intervention will lead to serious problems that outweigh the operative risks; therefore, I  recommended a partial colectomy to remove the pathology.  Laparoscopic & open techniques were discussed.   Risks such as bleeding, infection, abscess, leak, reoperation, possible ostomy, hernia, heart attack, death, and other risks were discussed.  I noted a good likelihood this will help address the problem.   Goals of post-operative recovery were discussed as well.  We will work to minimize complications.  Educational materials on the pathology had been given in the office.  Questions were answered.    The patient expressed understanding & wished to proceed with surgery.  OR FINDINGS:   Patient had Posterior mobile firm rectal mass a 3 cm proximal to the anal verge consistent with a rectal cancer.  No obvious metastatic disease on visceral parietal peritoneum or liver.  The anastomosis rests 1 cm from the anal verge by rigid proctoscopy.  DESCRIPTION:   Informed consent was confirmed.  The patient underwent general anaesthesia without difficulty.  The patient was positioned appropriately.  VTE prevention in place.  The patient's abdomen was clipped, prepped, & draped in a sterile fashion.  Surgical timeout confirmed our plan.  The patient was positioned in reverse Trendelenburg.  Abdominal entry was gained using Veress needle & port placement in the right upper abdomen.  Entry was clean.  I induced carbon dioxide insufflation.  Camera inspection revealed no injury.  Extra ports were carefully placed under direct laparoscopic visualization.  I reflected the greater omentum and the upper abdomen the small bowel in the upper abdomen.  The patient was carefully positioned.  The Intuitive daVinci robot was carefully docked with camera & instruments carefully placed.  The patient had a tortuous sigmoid colon with adhesions suspicious for  prior diverticulitis.  No evidence of metastatic disease the  I scored the base of peritoneum of the medial side of the mesentery of the left colon from the  ligament of Treitz to the peritoneal reflection of the mid rectum.   I elevated the sigmoid mesentery and entered into the retro-mesenteric plane. We were able to identify the left ureter and gonadal vessels. We kept those posterior within the retroperitoneum and elevated the left colon mesentery off that. I did isolated IMA pedicle but did not ligate it yet.  I continued distally and got into the avascular plane posterior to the mesorectum.  I skeletonized the lymph nodes off the inferior mesenteric artery pedicle.  I went down to its takeoff from the aorta.  I isolated the inferior mesenteric vein off of the ligament of Treitz just cephalad to that as well.  After confirming the left ureter was out of the way, I went ahead and ligated the inferior mesenteric artery pedicle just near its takeoff from the aorta.  I did ligate the inferior mesenteric vein in a similar fashion.  We ensured hemostasis.  Because of the very distal location, I felt the patient would benefit from transanal extraction and handsewn anastomosis.  Before that we decided to more aggressive splenic flexure mobilization.   We repositioned & docked the robot with the  patient in reverse Trendelenburg.  Continue lateral to medial mobilization of the descending colon up around the splenic flexure.  I then could retroperitoneal dissection to help free the left colon off the retroperitoneum including the left kidney.  Urine come around the inferior pancreatic attachments.  We freed the greater omentum off the mid transverse colon and followed up towards the splenic flexure.  The splenic flexure was able to be mobilized and stayed away from the spleen.  We carefully freed that.  With that the entire left colon was mobile and could be flipped over to the right side.  The mid descending colon and stretched down to the pelvic brim.  We therefore proceeded with transanal extraction.  I rescrubbed.  Patient positioned high lithotomy.  I placed a  bilateral anorectal field block using liposomal bupivacaine.  Placed a [place] retractor To help retract the anal canal.  Felt the distal aspect of the rectal cancer 3-4cm from the anal verge posteriorly.   I made a purse string of Prolene around the distal rectum to provide a watertight seal in minimize spillage.  I excised into the rectum posteriorly 2 cm distal to the most posterior distal margin.  Entered into the pelvic dissection.  I excised the proximal rectum off the rectal canal.  Let 1 cm cuff circumferecntially.  I eviscerated the rectum out easily.  Ref of resistance on trying to get the sigmoid colon off.  We performed diagnostic laparoscopy.  We laparoscopically unfolded the sigmoid colon from adhesions to straighten it out.  That allowed [place] to reduce the sigmoid colon and most of the descending colon out transanally.  I picked a location 8 cm out the anus and transected with the stapler.  The intervening mesentery with clamps and ties, Saving the marginal artery and major branch of the left colic vein.  We sent the rectosigmoid colon specimen off to go to pathology. I irrigated pelvis transanally with antibiotic irrigation (clindamycin/gentamicin) x1 L to good result.  Hemostasis was excellent.  I proceeded with handsewn anastomosis.  I oversewed the end colon staple line with mesentery using interrupted Vicryl suture.  I placed 2-0  PDS in 4 corners anterolaterally and posterolaterally through the anorectal stump.  Made an opening in the anterior colonic wall 6 cm proximal to the end staple line.  The edges bled.  I then placed the PDS stitches through the 4 corners.  I returned the colon back into the pelvis.  I tied the stitches down.  I ran 3-0 Vicryl suture in between these corner stitches to good result.  This provided a wide open side colon to end anorectal handsewn anastomosis.  I inspected and noted a good watertight seal.  Mild ischemia but no necrosis.  Above the sphincters just barely.   There was no tension of mesentery or bowel at the anastomosis.   Tissues looked viable.  Endoluminal gas was evacuated.  We changed gowns and gloves.  We performed diagnostic laparoscopy.  We aspirated the antibiotic irrigation.  Hemostasis was good.   Ureters & bowel uninjured.  The anastomosis looked healthy.  There was some moderate tension at the anastomosis.  I mobilized the greater omentum down and the left colon down.  I took the tension off markedly.  I was able to bring some greater omentum down to help fill the left pelvis.    We found the ileocecal valve.  I found of terminal ileum that stretched well to the right lower quadrant pre-marked ileostomy port site.  I placed a drain through the left lateral port site down into the pelvis.  I removed CO2 gas out through the ports.  Excised the skin around the right lower quadrant paramedian premarked ileostomy port site.  Excised a disc of skin and subcutaneous tissue.  I transected the anterior rectus fascia transversely.  I split the rectus muscle.  Able to get into the peritoneum.  Bring up the ileum out the abdomen..  We ran the bowel proximally and distally to insure we had proper orientation.  The more distal end was allowed to rest at 6:00 caudad.  I closed the 53mm port sites using Monocryl stitch and sterile dressing.  I matured the ileostomy using 3-0 Vicryl sutures.  Again the proximal opening is cephalad at noon.  Patient is being extubated go to recovery room. I discussed postop care with the patient in detail the office & in the holding area. Instructions are written. I updated the status of the patient to the patient's family.  I made recommendations.  I answered questions.  Understanding & appreciation was expressed.

## 2014-03-20 NOTE — Transfer of Care (Signed)
Immediate Anesthesia Transfer of Care Note  Patient: [name]  Procedure(s) Performed: Procedure(s): ROBOTIC ASSISTED low anterior resection, splenic flexure mobilization diverting loop ileostomy, coloanal handsewn anastomosis (N/A)  Patient Location: PACU  Anesthesia Type:General  Level of Consciousness: awake, alert  and oriented  Airway & Oxygen Therapy: Patient Spontanous Breathing and Patient connected to face mask oxygen  Post-op Assessment: Report given to PACU RN and Post -op Vital signs reviewed and stable  Post vital signs: Reviewed and stable  Complications: No apparent anesthesia complications

## 2014-03-21 ENCOUNTER — Encounter (HOSPITAL_COMMUNITY): Payer: Self-pay | Admitting: *Deleted

## 2014-03-21 LAB — BASIC METABOLIC PANEL WITH GFR
Anion gap: 9 (ref 5–15)
BUN: 20 mg/dL (ref 6–23)
CO2: 27 meq/L (ref 19–32)
Calcium: 8.6 mg/dL (ref 8.4–10.5)
Chloride: 100 meq/L (ref 96–112)
Creatinine, Ser: 1.02 mg/dL (ref 0.50–1.35)
GFR calc Af Amer: >90 mL/min
GFR calc non Af Amer: 80 mL/min — ABNORMAL LOW
Glucose, Bld: 124 mg/dL — ABNORMAL HIGH (ref 70–99)
Potassium: 4.4 meq/L (ref 3.7–5.3)
Sodium: 136 meq/L — ABNORMAL LOW (ref 137–147)

## 2014-03-21 LAB — CBC
HCT: 37 % — ABNORMAL LOW (ref 39.0–52.0)
Hemoglobin: 12.6 g/dL — ABNORMAL LOW (ref 13.0–17.0)
MCH: 31.3 pg (ref 26.0–34.0)
MCHC: 34.1 g/dL (ref 30.0–36.0)
MCV: 92 fL (ref 78.0–100.0)
Platelets: 304 10*3/uL (ref 150–400)
RBC: 4.02 MIL/uL — ABNORMAL LOW (ref 4.22–5.81)
RDW: 13.5 % (ref 11.5–15.5)
WBC: 11.6 10*3/uL — ABNORMAL HIGH (ref 4.0–10.5)

## 2014-03-21 LAB — MAGNESIUM: Magnesium: 2 mg/dL (ref 1.5–2.5)

## 2014-03-21 MED ORDER — ONDANSETRON HCL 4 MG/2ML IJ SOLN: 4.0000 mg | Freq: Four times a day (QID) | INTRAMUSCULAR | Status: DC | PRN

## 2014-03-21 MED ORDER — MORPHINE SULFATE 2 MG/ML IJ SOLN
INTRAMUSCULAR | Status: AC
  Filled 2014-03-21: qty 1

## 2014-03-21 MED ORDER — STERILE WATER FOR INJECTION IJ SOLN
INTRAMUSCULAR | Status: AC
  Filled 2014-03-21: qty 10

## 2014-03-21 MED ORDER — SODIUM CHLORIDE 0.9 % IJ SOLN: 9.0000 mL | INTRAMUSCULAR | Status: DC | PRN

## 2014-03-21 MED ORDER — SODIUM CHLORIDE 0.9 % IJ SOLN: 10.0000 mL | INTRAMUSCULAR | Status: DC | PRN

## 2014-03-21 MED ORDER — MORPHINE SULFATE 2 MG/ML IJ SOLN
2.0000 mg | Freq: Once | INTRAMUSCULAR | Status: AC
  Administered 2014-03-21: 2 mg via INTRAVENOUS

## 2014-03-21 MED ORDER — DIPHENHYDRAMINE HCL 50 MG/ML IJ SOLN: 12.5000 mg | Freq: Four times a day (QID) | INTRAMUSCULAR | Status: DC | PRN

## 2014-03-21 MED ORDER — NALOXONE HCL 0.4 MG/ML IJ SOLN: 0.4000 mg | INTRAMUSCULAR | Status: DC | PRN

## 2014-03-21 MED ORDER — KETOROLAC TROMETHAMINE 15 MG/ML IJ SOLN
15.0000 mg | Freq: Four times a day (QID) | INTRAMUSCULAR | Status: DC | PRN
  Administered 2014-03-22: 15 mg via INTRAVENOUS
  Filled 2014-03-21: qty 1

## 2014-03-21 MED ORDER — CHLORPROMAZINE HCL 25 MG/ML IJ SOLN
25.0000 mg | Freq: Three times a day (TID) | INTRAMUSCULAR | Status: DC | PRN
  Administered 2014-03-21 – 2014-03-23 (×5): 25 mg via INTRAMUSCULAR
  Filled 2014-03-21 (×5): qty 1

## 2014-03-21 MED ORDER — MORPHINE SULFATE (PF) 1 MG/ML IV SOLN
INTRAVENOUS | Status: DC
  Administered 2014-03-21: 8.86 mg via INTRAVENOUS
  Administered 2014-03-21: 12:00:00 via INTRAVENOUS
  Administered 2014-03-21: 13.5 mg via INTRAVENOUS
  Administered 2014-03-22: 12.5 mg via INTRAVENOUS
  Administered 2014-03-22: 6 mg via INTRAVENOUS
  Administered 2014-03-22: 43.46 mg via INTRAVENOUS
  Administered 2014-03-22: 7.5 mg via INTRAVENOUS
  Administered 2014-03-23: 4.5 mg via INTRAVENOUS
  Administered 2014-03-23: 7.5 mg via INTRAVENOUS
  Administered 2014-03-23: 7.11 mg via INTRAVENOUS
  Administered 2014-03-23 (×2): 1.5 mg via INTRAVENOUS
  Administered 2014-03-23: 12:00:00 via INTRAVENOUS
  Administered 2014-03-23: 3 mg via INTRAVENOUS
  Administered 2014-03-23: 3.19 mg via INTRAVENOUS
  Administered 2014-03-24: 09:00:00 via INTRAVENOUS
  Administered 2014-03-24: 4.5 mg via INTRAVENOUS
  Filled 2014-03-21 (×5): qty 25

## 2014-03-21 MED ORDER — DIPHENHYDRAMINE HCL 12.5 MG/5ML PO ELIX: 12.5000 mg | ORAL_SOLUTION | Freq: Four times a day (QID) | ORAL | Status: DC | PRN

## 2014-03-21 MED ORDER — MORPHINE SULFATE 2 MG/ML IJ SOLN
2.0000 mg | INTRAMUSCULAR | Status: DC | PRN
  Administered 2014-03-24: 2 mg via INTRAVENOUS
  Filled 2014-03-21: qty 1

## 2014-03-21 NOTE — Progress Notes (Signed)
[address] [place], [place] [address] Phone: [phone] FAX: [place] [phone] 1956-03-12  CARE TEAM:  PCP: Rubbie Battiest, MD  Outpatient Care Team: Patient Care Team: [name], MD as PCP - General (Family Medicine) Satira Sark, MD as Consulting Physician (Cardiology) Danie Binder, MD as Consulting Physician (Gastroenterology) Milus Banister, MD as Attending Physician (Gastroenterology) Farrel Gobble, MD as Consulting Physician (Medical Oncology) Adin Hector, MD as Consulting Physician (General Surgery)  Inpatient Treatment Team: Treatment Team: Attending Provider: Adin Hector, MD; Registered Nurse: Claretta Fraise, RN; Physical Therapist: Claretha Cooper, PT; Registered Nurse: Kristopher Oppenheim, RN   Subjective:  Some soreness.   Some hiccups Controlled with medicines but hard to get dose last night & nauseated w Dilaudid Sat up Hates the SCDs Family/friends in room   Objective:  Vital signs:  Filed Vitals:   03/20/14 1745 03/20/14 2215 03/21/14 0500 03/21/14 0603  BP: 118/74 119/84  124/86  Pulse: 68 75  85  Temp: 97.6 F (36.4 C) 98.4 F (36.9 C)  97.9 F (36.6 C)  TempSrc: Oral Oral  Axillary  Resp: _0 Height:   _1  (1.651 m)   Weight:   176 lb (79.833 kg)   SpO2: 99% 95%  99%       Intake/Output   Yesterday:  07/31 0701 - 08/01 0700 In: 3623.3 [P.O.:120; I.V.:3403.3; IV Piggyback:100] Out: 4097 [Urine:1025; Drains:170; Blood:300] This shift:     Bowel function:  Flatus: n  BM: n  Drain: serosanguinous  Physical Exam:  General: Pt awake/alert/oriented x4 in no acute distress Eyes: PERRL, normal EOM.  Sclera clear.  No icterus Neuro: CN II-XII intact w/o focal sensory/motor deficits. Lymph: No head/neck/groin lymphadenopathy Psych:  No delerium/psychosis/paranoia HENT: Normocephalic, Mucus membranes  moist.  No thrush Neck: Supple, No tracheal deviation Chest: No chest wall pain w good excursion CV:  Pulses intact.  Regular rhythm MS: Normal AROM mjr joints.  No obvious deformity Abdomen: Soft.  Nondistended.   Mildly tender at incisions only.  Ileostomy edematous but viable.  Seroud fluid in bag.  No evidence of peritonitis.  No incarcerated hernias. Ext:  SCDs BLE.  No mjr edema.  No cyanosis Skin: No petechiae / purpura   Problem List:   Principal Problem:   Rectal cancer, uT2uN0, s/p LAR/coloanal/ileostomy 03/20/2014 Active Problems:   Tobacco abuse, in remission   GERD (gastroesophageal reflux disease)   Assessment  [name]  58 y.o. male  1 Day Post-Op   PROCEDURE:  ROBOTIC ASSISTED low anterior resection  TRANSANAL TRANSABDOMINAL TME WITH HANDSEWN COLOANAL ANASTOMOSIS  splenic flexure mobilization  diverting loop ileostomy  Recovering OK  Plan:  Liquids.  Advance diet per protocol.  Go slowly today  Switch narcotics.  Try PCA  Ileostomy care.  Hydration.  Follow electrolytes.  Follow up on pathology   -VTE prophylaxis- SCDs, etc  -mobilize as tolerated to help recovery  I updated the patient's status to the patient.  Recommendations were made.  Questions were answered.  The patient expressed understanding & appreciation.   Adin Hector, M.D., F.A.C.S. Gastrointestinal and Minimally Invasive Surgery [facility] [address] [place], [place] [address] [phone] Main / Paging   03/21/2014   Results:   Labs: Results for orders placed during the hospital encounter of 03/20/14 (from the past 48 hour(s))  TYPE AND SCREEN     Status: None   Collection Time    03/20/14  5:50 AM      Result Value Ref Range   ABO/RH(D) A NEG     Antibody Screen NEG     Sample Expiration 03/23/2014    ABO/RH     Status: None   Collection Time    03/20/14  6:00 AM      Result Value Ref Range   ABO/RH(D) A NEG     BASIC METABOLIC PANEL     Status: Abnormal   Collection Time    03/21/14  6:13 AM      Result Value Ref Range   Sodium 136 (*) 137 - 147 mEq/L   Potassium 4.4  3.7 - 5.3 mEq/L   Chloride 100  96 - 112 mEq/L   CO2 27  19 - 32 mEq/L   Glucose, Bld 124 (*) 70 - 99 mg/dL   BUN 20  6 - 23 mg/dL   Creatinine, Ser 1.02  0.50 - 1.35 mg/dL   Calcium 8.6  8.4 - 10.5 mg/dL   GFR calc non Af Amer 80 (*) >90 mL/min   GFR calc Af Amer >90  >90 mL/min   Comment: (NOTE)     The eGFR has been calculated using the CKD EPI equation.     This calculation has not been validated in all clinical situations.     eGFR's persistently <90 mL/min signify possible Chronic Kidney     Disease.   Anion gap 9  5 - 15  CBC     Status: Abnormal   Collection Time    03/21/14  6:13 AM      Result Value Ref Range   WBC 11.6 (*) 4.0 - 10.5 K/uL   RBC 4.02 (*) 4.22 - 5.81 MIL/uL   Hemoglobin 12.6 (*) 13.0 - 17.0 g/dL   HCT 37.0 (*) 39.0 - 52.0 %   MCV 92.0  78.0 - 100.0 fL   MCH 31.3  26.0 - 34.0 pg   MCHC 34.1  30.0 - 36.0 g/dL   RDW 13.5  11.5 - 15.5 %   Platelets 304  150 - 400 K/uL  MAGNESIUM     Status: None   Collection Time    03/21/14  6:13 AM      Result Value Ref Range   Magnesium 2.0  1.5 - 2.5 mg/dL    Imaging / Studies: No results found.  Medications / Allergies: per chart  Antibiotics: Anti-infectives   Start     Dose/Rate Route Frequency Ordered Stop   03/20/14 2359  cefoTEtan (CEFOTAN) 2 g in dextrose 5 % 50 mL IVPB     2 g 100 mL/hr over 30 Minutes Intravenous Every 12 hours 03/20/14 1704 03/20/14 2345   03/20/14 1228  clindamycin (CLEOCIN) 900 mg, gentamicin (GARAMYCIN) 240 mg in sodium chloride 0.9 % 1,000 mL for intraperitoneal lavage  Status:  Discontinued       As needed 03/20/14 1228 03/20/14 1329   03/20/14 0518  cefoTEtan (CEFOTAN) 2 g in dextrose 5 % 50 mL IVPB     2 g 100 mL/hr over 30 Minutes Intravenous On call to O.R. 03/20/14 0518 03/20/14 1259   03/20/14 0000   clindamycin (CLEOCIN) 900 mg, gentamicin (GARAMYCIN) 240 mg in sodium chloride 0.9 % 1,000 mL for intraperitoneal lavage  Status:  Discontinued    Comments:  Pharmacy may adjust dosing strength, schedule, rate of infusion, etc as needed  to optimize therapy    Intraperitoneal To Surgery 03/19/14 1341 03/20/14 1453   03/19/14 1345  clindamycin (CLEOCIN) 900 mg, gentamicin (GARAMYCIN) 240 mg in sodium chloride 0.9 % 1,000 mL for intraperitoneal lavage  Status:  Discontinued    Comments:  Pharmacy may adjust dosing strength, schedule, rate of infusion, etc as needed to optimize therapy   1 application Intraperitoneal To Surgery 03/19/14 1339 03/19/14 1341       Note: Portions of this report may have been transcribed using voice recognition software. Every effort was made to ensure accuracy; however, inadvertent computerized transcription errors may be present.   Any transcriptional errors that result from this process are unintentional.

## 2014-03-21 NOTE — Progress Notes (Signed)
Peripherally Inserted Central Catheter/Midline Placement  The IV Nurse has discussed with the patient and/or persons authorized to consent for the patient, the purpose of this procedure and the potential benefits and risks involved with this procedure.  The benefits include less needle sticks, lab draws from the catheter and patient may be discharged home with the catheter.  Risks include, but not limited to, infection, bleeding, blood clot (thrombus formation), and puncture of an artery; nerve damage and irregular heat beat.  Alternatives to this procedure were also discussed.  PICC/Midline Placement Documentation  PICC / Midline Single Lumen 24/40/10 PICC Left Basilic 49 cm 0 cm (Active)  Indication for Insertion or Continuance of Line Home intravenous therapies (PICC only) 03/21/2014 12:06 PM  Exposed Catheter (cm) 0 cm 03/21/2014 12:06 PM  Site Assessment Clean;Dry;Intact 03/21/2014 12:06 PM  Line Status Flushed;Saline locked;Blood return noted 03/21/2014 12:06 PM  Dressing Type Transparent 03/21/2014 12:06 PM  Dressing Status Clean;Dry;Intact;Antimicrobial disc in place 03/21/2014 12:06 PM  Line Care Connections checked and tightened 03/21/2014 12:06 PM  Line Adjustment (NICU/IV Team Only) No 03/21/2014 12:06 PM  Dressing Intervention New dressing 03/21/2014 12:06 PM  Dressing Change Due 03/28/14 03/21/2014 12:06 PM       [name] 03/21/2014, 12:07 PM

## 2014-03-21 NOTE — Progress Notes (Signed)
PT Cancellation Note  Patient Details Name: [name] MRN: [id number] DOB: [birth year]   Cancelled Treatment:    Reason Eval/Treat Not Completed: Patient at procedure or test/unavailable. Will check back this PM   Claretha Cooper 03/21/2014, 12:15 PM [phone]

## 2014-03-22 MED ORDER — METOPROLOL TARTRATE 1 MG/ML IV SOLN
2.5000 mg | Freq: Four times a day (QID) | INTRAVENOUS | Status: DC
  Administered 2014-03-22 – 2014-03-23 (×3): 2.5 mg via INTRAVENOUS
  Filled 2014-03-22 (×7): qty 5

## 2014-03-22 MED ORDER — LACTATED RINGERS IV BOLUS (SEPSIS)
1000.0000 mL | Freq: Once | INTRAVENOUS | Status: AC
  Administered 2014-03-22: 1000 mL via INTRAVENOUS

## 2014-03-22 NOTE — Progress Notes (Signed)
Pt still not voided. Reports has not urge to void. Bladder scanned 108 cc urine.

## 2014-03-22 NOTE — Progress Notes (Signed)
Patient ID: [name], male   DOB: Jun 29, 1956, 58 y.o.   MRN: [id number] [facility] Surgery Progress Note:   2 Days Post-Op  Subjective: Mental status is clear.  Doing well.  Sitting up Objective: Vital signs in last 24 hours: Temp:  [98.2 F (36.8 C)-99.9 F (37.7 C)] 98.9 F (37.2 C) (08/02 0610) Pulse Rate:  [75-121] 121 (08/02 0610) Resp:  [16-21] 18 (08/02 0610) BP: (112-165)/(80-112) 112/88 mmHg (08/02 0610) SpO2:  [94 %-100 %] 98 % (08/02 0610) Weight:  [169 lb 1.5 oz (76.7 kg)] 169 lb 1.5 oz (76.7 kg) (08/02 0610)  Intake/Output from previous day: 08/01 0701 - 08/02 0700 In: 1560 [P.O.:410; I.V.:1150] Out: 1502 [Urine:975; Drains:502; Stool:25] Intake/Output this shift:    Physical Exam: Work of breathing is not labored. JP is serosanguinous.  Ostomy pink  Lab Results:  Results for orders placed during the hospital encounter of 03/20/14 (from the past 48 hour(s))  BASIC METABOLIC PANEL     Status: Abnormal   Collection Time    03/21/14  6:13 AM      Result Value Ref Range   Sodium 136 (*) 137 - 147 mEq/L   Potassium 4.4  3.7 - 5.3 mEq/L   Chloride 100  96 - 112 mEq/L   CO2 27  19 - 32 mEq/L   Glucose, Bld 124 (*) 70 - 99 mg/dL   BUN 20  6 - 23 mg/dL   Creatinine, Ser 1.02  0.50 - 1.35 mg/dL   Calcium 8.6  8.4 - 10.5 mg/dL   GFR calc non Af Amer 80 (*) >90 mL/min   GFR calc Af Amer >90  >90 mL/min   Comment: (NOTE)     The eGFR has been calculated using the CKD EPI equation.     This calculation has not been validated in all clinical situations.     eGFR's persistently <90 mL/min signify possible Chronic Kidney     Disease.   Anion gap 9  5 - 15  CBC     Status: Abnormal   Collection Time    03/21/14  6:13 AM      Result Value Ref Range   WBC 11.6 (*) 4.0 - 10.5 K/uL   RBC 4.02 (*) 4.22 - 5.81 MIL/uL   Hemoglobin 12.6 (*) 13.0 - 17.0 g/dL   HCT 37.0 (*) 39.0 - 52.0 %   MCV 92.0  78.0 - 100.0 fL   MCH 31.3  26.0 - 34.0 pg   MCHC 34.1  30.0 -  36.0 g/dL   RDW 13.5  11.5 - 15.5 %   Platelets 304  150 - 400 K/uL  MAGNESIUM     Status: None   Collection Time    03/21/14  6:13 AM      Result Value Ref Range   Magnesium 2.0  1.5 - 2.5 mg/dL    Radiology/Results: No results found.  Anti-infectives: Anti-infectives   Start     Dose/Rate Route Frequency Ordered Stop   03/20/14 2359  cefoTEtan (CEFOTAN) 2 g in dextrose 5 % 50 mL IVPB     2 g 100 mL/hr over 30 Minutes Intravenous Every 12 hours 03/20/14 1704 03/20/14 2345   03/20/14 1228  clindamycin (CLEOCIN) 900 mg, gentamicin (GARAMYCIN) 240 mg in sodium chloride 0.9 % 1,000 mL for intraperitoneal lavage  Status:  Discontinued       As needed 03/20/14 1228 03/20/14 1329   03/20/14 0518  cefoTEtan (CEFOTAN) 2 g in dextrose 5 %  50 mL IVPB     2 g 100 mL/hr over 30 Minutes Intravenous On call to O.R. 03/20/14 0518 03/20/14 1259   03/20/14 0000  clindamycin (CLEOCIN) 900 mg, gentamicin (GARAMYCIN) 240 mg in sodium chloride 0.9 % 1,000 mL for intraperitoneal lavage  Status:  Discontinued    Comments:  Pharmacy may adjust dosing strength, schedule, rate of infusion, etc as needed to optimize therapy    Intraperitoneal To Surgery 03/19/14 1341 03/20/14 1453   03/19/14 1345  clindamycin (CLEOCIN) 900 mg, gentamicin (GARAMYCIN) 240 mg in sodium chloride 0.9 % 1,000 mL for intraperitoneal lavage  Status:  Discontinued    Comments:  Pharmacy may adjust dosing strength, schedule, rate of infusion, etc as needed to optimize therapy   1 application Intraperitoneal To Surgery 03/19/14 1339 03/19/14 1341      Assessment/Plan: Problem List: Patient Active Problem List   Diagnosis Date Noted  . Rectal cancer, uT2uN0, s/p LAR/coloanal/ileostomy 03/20/2014 12/25/2013  . Hemorrhoids, internal 12/25/2013  . GERD (gastroesophageal reflux disease) 11/27/2013  . Atypical chest pain 11/10/2013  . Tobacco abuse, in remission 11/10/2013    Will get PT to see and assist with walking since he has an  old injury of the left lower extremity.  Advance to full liquids.  2 Days Post-Op    LOS: 2 days   Matt B. Hassell Done, MD, [facility] [phone] beeper [phone]  03/22/2014 8:30 AM

## 2014-03-22 NOTE — Progress Notes (Signed)
Inserted ffoley catheter without difficulty.  Approx. 200 cc of amber colored urine returned to foley bag. Will continue to monitor.

## 2014-03-22 NOTE — Progress Notes (Signed)
Pt still not void 8 hrs after catheter removed. Bladder scanned 90 cc urine. Will continue to monitor.

## 2014-03-22 NOTE — Evaluation (Signed)
Physical Therapy Evaluation Patient Details Name: [name] MRN: [id number] DOB: 09-26-1955 Today's Date: 03/22/2014   History of Present Illness  58 yo admitted for resection of rectal cancwer.  Clinical Impression  Pt tolerated ambulation very well. Pt will benefit from PT to address problems listed in note.    Follow Up Recommendations No PT follow up    Equipment Recommendations  None recommended by PT    Recommendations for Other Services       Precautions / Restrictions Precautions Precaution Comments: drain in abdomen, rectal bleeding      Mobility  Bed Mobility                  Transfers                    Ambulation/Gait Ambulation/Gait assistance: Min guard Ambulation Distance (Feet): 420 Feet Assistive device: Rolling walker (2 wheeled) Gait Pattern/deviations: Step-through pattern     General Gait Details: limps to L  Stairs            Wheelchair Mobility    Modified Rankin (Stroke Patients Only)       Balance                                             Pertinent Vitals/Pain No pain    Home Living Family/patient expects to be discharged to:: Private residence Living Arrangements: Spouse/significant other Available Help at Discharge: Family;Available 24 hours/day Type of Home: Mobile home Home Access: Stairs to enter Entrance Stairs-Rails: None Entrance Stairs-Number of Steps: 4 Home Layout: Multi-level Home Equipment: Cane - single point      Prior Function Level of Independence: Independent               Hand Dominance        Extremity/Trunk Assessment   Upper Extremity Assessment: Overall WFL for tasks assessed           Lower Extremity Assessment: LLE deficits/detail   LLE Deficits / Details: has decreased ROM and leg is shortened from fractures  in past     Communication   Communication: No difficulties  Cognition Arousal/Alertness: Awake/alert Behavior  During Therapy: WFL for tasks assessed/performed Overall Cognitive Status: Within Functional Limits for tasks assessed                      General Comments      Exercises        Assessment/Plan    PT Assessment Patient needs continued PT services  PT Diagnosis Generalized weakness   PT Problem List Decreased activity tolerance  PT Treatment Interventions Gait training   PT Goals (Current goals can be found in the Care Plan section) Acute Rehab PT Goals Patient Stated Goal: I want to walk PT Goal Formulation: With patient/family Time For Goal Achievement: 04/05/14 Potential to Achieve Goals: Good    Frequency Min 3X/week   Barriers to discharge        Co-evaluation               End of Session   Activity Tolerance: Patient tolerated treatment well Patient left: with family/visitor present Nurse Communication: Mobility status (bleeding)         Time: 3300-7622 PT Time Calculation (min): 25 min   Charges:   PT Evaluation $Initial PT Evaluation Tier I: 1 Procedure  PT Treatments $Gait Training: 23-37 mins   PT G Codes:          Claretha Cooper 03/22/2014, 12:59 PM

## 2014-03-23 LAB — URINALYSIS, ROUTINE W REFLEX MICROSCOPIC
Bilirubin Urine: NEGATIVE
Glucose, UA: NEGATIVE mg/dL
HGB URINE DIPSTICK: NEGATIVE
Ketones, ur: NEGATIVE mg/dL
Leukocytes, UA: NEGATIVE
Nitrite: NEGATIVE
Protein, ur: NEGATIVE mg/dL
Specific Gravity, Urine: 1.029 (ref 1.005–1.030)
UROBILINOGEN UA: 0.2 mg/dL (ref 0.0–1.0)
pH: 6.5 (ref 5.0–8.0)

## 2014-03-23 LAB — CBC
HCT: 34.3 % — ABNORMAL LOW (ref 39.0–52.0)
Hemoglobin: 11.6 g/dL — ABNORMAL LOW (ref 13.0–17.0)
MCH: 31.4 pg (ref 26.0–34.0)
MCHC: 33.8 g/dL (ref 30.0–36.0)
MCV: 93 fL (ref 78.0–100.0)
Platelets: 271 10*3/uL (ref 150–400)
RBC: 3.69 MIL/uL — ABNORMAL LOW (ref 4.22–5.81)
RDW: 13.4 % (ref 11.5–15.5)
WBC: 21 10*3/uL — ABNORMAL HIGH (ref 4.0–10.5)

## 2014-03-23 LAB — BASIC METABOLIC PANEL
Anion gap: 7 (ref 5–15)
BUN: 31 mg/dL — ABNORMAL HIGH (ref 6–23)
CO2: 28 mEq/L (ref 19–32)
CREATININE: 0.7 mg/dL (ref 0.50–1.35)
Calcium: 8.1 mg/dL — ABNORMAL LOW (ref 8.4–10.5)
Chloride: 98 mEq/L (ref 96–112)
GFR calc non Af Amer: >90 mL/min (ref >90)
Glucose, Bld: 153 mg/dL — ABNORMAL HIGH (ref 70–99)
Potassium: 4.9 mEq/L (ref 3.7–5.3)
Sodium: 133 mEq/L — ABNORMAL LOW (ref 137–147)

## 2014-03-23 MED ORDER — METOPROLOL TARTRATE 1 MG/ML IV SOLN
5.0000 mg | Freq: Four times a day (QID) | INTRAVENOUS | Status: DC
  Administered 2014-03-23 – 2014-03-24 (×3): 5 mg via INTRAVENOUS
  Filled 2014-03-23 (×8): qty 5

## 2014-03-23 MED ORDER — ASPIRIN EC 81 MG PO TBEC
81.0000 mg | DELAYED_RELEASE_TABLET | Freq: Every day | ORAL | Status: DC
  Administered 2014-03-23: 81 mg via ORAL
  Filled 2014-03-23 (×2): qty 1

## 2014-03-23 MED ORDER — METRONIDAZOLE IN NACL 5-0.79 MG/ML-% IV SOLN
500.0000 mg | Freq: Four times a day (QID) | INTRAVENOUS | Status: DC
  Administered 2014-03-23 – 2014-03-24 (×5): 500 mg via INTRAVENOUS
  Filled 2014-03-23 (×7): qty 100

## 2014-03-23 MED ORDER — TAB-A-VITE/IRON PO TABS
1.0000 | ORAL_TABLET | Freq: Every day | ORAL | Status: DC
  Administered 2014-03-23: 1 via ORAL
  Filled 2014-03-23 (×2): qty 1

## 2014-03-23 MED ORDER — DEXTROSE-NACL 5-0.45 % IV SOLN
INTRAVENOUS | Status: DC
  Administered 2014-03-23: 75 mL/h via INTRAVENOUS
  Administered 2014-03-24: 75 mL via INTRAVENOUS

## 2014-03-23 MED ORDER — DEXTROSE 5 % IV SOLN
2.0000 g | INTRAVENOUS | Status: DC
  Administered 2014-03-23 – 2014-03-24 (×2): 2 g via INTRAVENOUS
  Filled 2014-03-23 (×2): qty 2

## 2014-03-23 MED ORDER — LACTATED RINGERS IV BOLUS (SEPSIS)
1000.0000 mL | Freq: Once | INTRAVENOUS | Status: AC
Start: 2014-03-23 — End: 2014-03-23
  Administered 2014-03-23: 1000 mL via INTRAVENOUS

## 2014-03-23 MED ORDER — SODIUM CHLORIDE 0.9 % IV SOLN
12.5000 mg | Freq: Four times a day (QID) | INTRAVENOUS | Status: DC | PRN
  Administered 2014-03-24: 25 mg via INTRAVENOUS
  Filled 2014-03-23: qty 1

## 2014-03-23 MED ORDER — PANTOPRAZOLE SODIUM 40 MG PO TBEC
40.0000 mg | DELAYED_RELEASE_TABLET | Freq: Two times a day (BID) | ORAL | Status: DC
Start: 2014-03-23 — End: 2014-03-24
  Administered 2014-03-23 (×2): 40 mg via ORAL
  Filled 2014-03-23 (×4): qty 1

## 2014-03-23 MED ORDER — VITAMIN C 500 MG PO TABS
500.0000 mg | ORAL_TABLET | Freq: Two times a day (BID) | ORAL | Status: DC
  Administered 2014-03-23 (×2): 500 mg via ORAL
  Filled 2014-03-23 (×4): qty 1

## 2014-03-23 MED ORDER — OMEGA-3-ACID ETHYL ESTERS 1 G PO CAPS
1.0000 g | ORAL_CAPSULE | Freq: Two times a day (BID) | ORAL | Status: DC
  Administered 2014-03-23 (×2): 1 g via ORAL
  Filled 2014-03-23 (×5): qty 1

## 2014-03-23 NOTE — Progress Notes (Signed)
Physical Therapy Treatment Patient Details Name: RHYLEE NUNN MRN: [id number] DOB: 02/18/1956 Today's Date: 03/23/2014    History of Present Illness 58 yo admitted for resection of rectal cancwer.    PT Comments    Pt feeling "better" slowly.  Assisted OOB to amb full unit around with spouse following with recliner for safety.  Assisted to [facility] for static stand at sink to brush teeth then back to bed.  Pt required increased assist with upper body to rise during supine to sit EOB.  Instructed on log roll tech to decrease stress to ABD muscles.  Assisted pt back to bed with support B LE's and increased time.   Follow Up Recommendations  No PT follow up     Equipment Recommendations       Recommendations for Other Services       Precautions / Restrictions Precautions Precaution Comments: drain in abdomen, colostomy, rectal bleeding Restrictions Weight Bearing Restrictions: No    Mobility  Bed Mobility Overal bed mobility: Needs Assistance             General bed mobility comments: needed assist to rise due to ABD "pain/tenderness" to get to EOB  Transfers Overall transfer level: Needs assistance Equipment used: Rolling walker (2 wheeled) Transfers: Sit to/from Stand Sit to Stand: Min guard;Supervision         General transfer comment: increased time and one VC on hand placment with stand to sit  Ambulation/Gait Ambulation/Gait assistance: Supervision;Min guard Ambulation Distance (Feet): 500 Feet Assistive device: Rolling walker (2 wheeled) Gait Pattern/deviations: Step-through pattern;Decreased stride length;Trunk flexed Gait velocity: decreased   General Gait Details: x 2 standing rest breaks.  Able to tolerate amb full unit around.  Def WBing/use of RW due to ABD "discomfort".     Stairs            Wheelchair Mobility    Modified Rankin (Stroke Patients Only)       Balance                                    Cognition                             Exercises      General Comments        Pertinent Vitals/Pain C/o 4/10 ABD pain PCA used x 1    Home Living                      Prior Function            PT Goals (current goals can now be found in the care plan section) Progress towards PT goals: Progressing toward goals    Frequency  Min 3X/week    PT Plan      Co-evaluation             End of Session Equipment Utilized During Treatment: Gait belt Activity Tolerance: Patient tolerated treatment well Patient left: in bed;with call bell/phone within reach;with family/visitor present     Time: 4010-2725 PT Time Calculation (min): 25 min  Charges:  $Gait Training: 8-22 mins $Therapeutic Activity: 23-37 mins                    G Codes:      Rica Koyanagi  PTA [facility]  Acute  Rehab Pager  [phone]  

## 2014-03-23 NOTE — Progress Notes (Signed)
[address] [place], [place] [address] Phone: [phone] FAX: [place] [phone] 04/16/1956  CARE TEAM:  PCP: Rubbie Battiest, MD  Outpatient Care Team: Patient Care Team: [name], MD as PCP - General (Family Medicine) Satira Sark, MD as Consulting Physician (Cardiology) Danie Binder, MD as Consulting Physician (Gastroenterology) Milus Banister, MD as Attending Physician (Gastroenterology) Farrel Gobble, MD as Consulting Physician (Medical Oncology) Adin Hector, MD as Consulting Physician (General Surgery)  Inpatient Treatment Team: Treatment Team: Attending Provider: Adin Hector, MD; Registered Nurse: Kristopher Oppenheim, RN   Subjective:  Feeling better overall Some soreness.  Using PCA occasionally   Some hiccups/belching Foley replaced Walking better - cleared by PT   Objective:  Vital signs:  Filed Vitals:   03/23/14 0200 03/23/14 0314 03/23/14 0545 03/23/14 0626  BP: 100/70  105/77   Pulse: 119  126 107  Temp: 98.7 F (37.1 C)  98.7 F (37.1 C)   TempSrc: Oral  Oral   Resp: _0 Height:      Weight:   175 lb 11.3 oz (79.7 kg)   SpO2: 97% 95% 96%     Last BM Date: 03/21/14 (ileostomy)  Intake/Output   Yesterday:  08/02 0701 - 08/03 0700 In: 4095 [P.O.:960; I.V.:3135] Out: 2863 [Urine:1000; Drains:185] This shift:     Bowel function:  Flatus: n  BM: n  Drain: serosanguinous  Physical Exam:  General: Pt awake/alert/oriented x4 in no acute distress Eyes: PERRL, normal EOM.  Sclera clear.  No icterus Neuro: CN II-XII intact w/o focal sensory/motor deficits. Lymph: No head/neck/groin lymphadenopathy Psych:  No delerium/psychosis/paranoia HENT: Normocephalic, Mucus membranes moist.  No thrush Neck: Supple, No tracheal deviation Chest: No chest wall pain w good excursion CV:  Pulses intact.  Regular rhythm MS: Normal AROM mjr  joints.  No obvious deformity Abdomen: Soft.  Mod distended.   Mildly tender at R ileostomy only.  No pelvis/LLQ pain.  Ileostomy edematous but viable.  Serous fluid in bag.  No evidence of peritonitis.  No incarcerated hernias. Ext:  SCDs BLE.  No mjr edema.  No cyanosis Skin: No petechiae / purpura   Problem List:   Principal Problem:   Rectal cancer, uT2uN0, s/p LAR/coloanal/ileostomy 03/20/2014 Active Problems:   Tobacco abuse, in remission   GERD (gastroesophageal reflux disease)   Assessment  [name]  57 y.o. male  3 Days Post-Op   PROCEDURE:  ROBOTIC ASSISTED low anterior resection  TRANSANAL TRANSABDOMINAL TME WITH HANDSEWN COLOANAL ANASTOMOSIS  splenic flexure mobilization  diverting loop ileostomy  Recovering OK but w inc WBC/HR  Plan:  Liquids only until ileus resolves.  Go slowly today  Continue PCA since helps mobility  IV ABx - check UA.  No evid peritonitis / ischemia but follow closely  Ileostomy care.  Hydration.  Follow electrolytes.  Change IVF to remove K  PPI for GERD  Foley x3 days for possible retention  B blocker   Follow up on pathology   -VTE prophylaxis- SCDs, etc  -mobilize as tolerated to help recovery  I updated the patient's status to the patient.  Recommendations were made.  Questions were answered.  The patient expressed understanding & appreciation.   Adin Hector, M.D., F.A.C.S. Gastrointestinal and Minimally Invasive Surgery [facility] [address], [place], [place] [address] [phone] Main / Paging   03/23/2014  Results:   Labs: Results for orders placed during the hospital encounter of 03/20/14 (from the past 48 hour(s))  CBC     Status: Abnormal   Collection Time    03/23/14  5:00 AM      Result Value Ref Range   WBC 21.0 (*) 4.0 - 10.5 K/uL   RBC 3.69 (*) 4.22 - 5.81 MIL/uL   Hemoglobin 11.6 (*) 13.0 - 17.0 g/dL   HCT 34.3 (*) 39.0 - 52.0 %   MCV  93.0  78.0 - 100.0 fL   MCH 31.4  26.0 - 34.0 pg   MCHC 33.8  30.0 - 36.0 g/dL   RDW 13.4  11.5 - 15.5 %   Platelets 271  150 - 400 K/uL  BASIC METABOLIC PANEL     Status: Abnormal   Collection Time    03/23/14  5:00 AM      Result Value Ref Range   Sodium 133 (*) 137 - 147 mEq/L   Potassium 4.9  3.7 - 5.3 mEq/L   Chloride 98  96 - 112 mEq/L   CO2 28  19 - 32 mEq/L   Glucose, Bld 153 (*) 70 - 99 mg/dL   BUN 31 (*) 6 - 23 mg/dL   Comment: DELTA CHECK NOTED   Creatinine, Ser 0.70  0.50 - 1.35 mg/dL   Calcium 8.1 (*) 8.4 - 10.5 mg/dL   GFR calc non Af Amer >90  >90 mL/min   GFR calc Af Amer >90  >90 mL/min   Comment: (NOTE)     The eGFR has been calculated using the CKD EPI equation.     This calculation has not been validated in all clinical situations.     eGFR's persistently <90 mL/min signify possible Chronic Kidney     Disease.   Anion gap 7  5 - 15    Imaging / Studies: No results found.  Medications / Allergies: per chart  Antibiotics: Anti-infectives   Start     Dose/Rate Route Frequency Ordered Stop   03/23/14 0745  cefTRIAXone (ROCEPHIN) 2 g in dextrose 5 % 50 mL IVPB    Comments:  Pharmacy may adjust dosing strength / duration / interval for maximal efficacy   2 g 100 mL/hr over 30 Minutes Intravenous Every 24 hours 03/23/14 0738     03/23/14 0745  metroNIDAZOLE (FLAGYL) IVPB 500 mg     500 mg 100 mL/hr over 60 Minutes Intravenous Every 6 hours 03/23/14 0738     03/20/14 2359  cefoTEtan (CEFOTAN) 2 g in dextrose 5 % 50 mL IVPB     2 g 100 mL/hr over 30 Minutes Intravenous Every 12 hours 03/20/14 1704 03/20/14 2345   03/20/14 1228  clindamycin (CLEOCIN) 900 mg, gentamicin (GARAMYCIN) 240 mg in sodium chloride 0.9 % 1,000 mL for intraperitoneal lavage  Status:  Discontinued       As needed 03/20/14 1228 03/20/14 1329   03/20/14 0518  cefoTEtan (CEFOTAN) 2 g in dextrose 5 % 50 mL IVPB     2 g 100 mL/hr over 30 Minutes Intravenous On call to O.R. 03/20/14 0518  03/20/14 1259   03/20/14 0000  clindamycin (CLEOCIN) 900 mg, gentamicin (GARAMYCIN) 240 mg in sodium chloride 0.9 % 1,000 mL for intraperitoneal lavage  Status:  Discontinued    Comments:  Pharmacy may adjust dosing strength, schedule, rate of infusion, etc as needed to optimize therapy    Intraperitoneal To Surgery 03/19/14 1341 03/20/14 1453   03/19/14 1345  clindamycin (CLEOCIN) 900 mg, gentamicin (GARAMYCIN) 240 mg in sodium chloride 0.9 % 1,000 mL for intraperitoneal lavage  Status:  Discontinued    Comments:  Pharmacy may adjust dosing strength, schedule, rate of infusion, etc as needed to optimize therapy   1 application Intraperitoneal To Surgery 03/19/14 1339 03/19/14 1341       Note: Portions of this report may have been transcribed using voice recognition software. Every effort was made to ensure accuracy; however, inadvertent computerized transcription errors may be present.   Any transcriptional errors that result from this process are unintentional.

## 2014-03-23 NOTE — Progress Notes (Addendum)
03/23/14 1852  Paged on call d/t pt HR 120's.  Dr Zella Richer called back, he will speak with Dr Johney Maine to discuss plan. Will continue to monitor.

## 2014-03-23 NOTE — Consult Note (Addendum)
[facility] ostomy consult note Stoma type/location: RLQ loop Ileosotmy Stomal assessment/size: 1 and 5/8 inches, round, moist, budded Peristomal assessment: intact, clear Treatment options for stomal/peristomal skin: skin barrier ring Output scant mucous Ostomy pouching: 2pc., 2 and 1/4 inch pouching system with skin barrier ring Education provided: Patient with eyes closed.  Not feeling well and very tired after just having returned to bed after walking. Ileus.  Events of weekend reviewed (indwelling urinary catheter out and then replaced, advanced diet and now back to liquids). Patient told me he was hot last evening and could not sleep.  A fan is requested and ordered.  Pouching system changed with wife watching.  Appropriate questions asked and answered provided.  She would like her daughter-in-law who is a Presenter, broadcasting to see the pouch change and to also learn how to care for her husband as well as she will soon have to return to work. They are discussing [facility] care and I believe this is a good plan.  I will plan on seeing tomorrow with daughter-in-law and wife. Patient is registered today with [employer] for post acute care support and product sampling per their request. Thanks, Maudie Flakes, MSN, RN, [place], [place], [place] [phone])

## 2014-03-23 NOTE — Progress Notes (Signed)
Physical Therapy Treatment Patient Details Name: EDEN RHO MRN: [id number] DOB: 04-29-1956 Today's Date: 03/23/2014    History of Present Illness 58 yo admitted for resection of rectal cancwer.    PT Comments    Assisted out of recliner to amb 2nd time today.  Increased c/o fatigue this pm vs am.  Was able to tolerate amb full unit around.  Assisted back to bed.  Follow Up Recommendations  No PT follow up     Equipment Recommendations       Recommendations for Other Services       Precautions / Restrictions Precautions Precaution Comments: drain in abdomen, colostomy, rectal bleeding Restrictions Weight Bearing Restrictions: No    Mobility  Bed Mobility Overal bed mobility: Needs Assistance    assisted back to bed Mod Assist B LE onto bed due to ABD discomfort         General bed mobility comments: needed assist to rise due to ABD "pain/tenderness" to get to EOB  Transfers Overall transfer level: Needs assistance Equipment used: Rolling walker (2 wheeled) Transfers: Sit to/from Stand Sit to Stand: Min guard;Supervision         General transfer comment: increased time and one VC on hand placment with stand to sit  Ambulation/Gait Ambulation/Gait assistance: Supervision;Min guard Ambulation Distance (Feet): 500 Feet Assistive device: Rolling walker (2 wheeled) Gait Pattern/deviations: Step-through pattern;Decreased stride length;Trunk flexed Gait velocity: decreased   General Gait Details: x 2 standing rest breaks.  Able to tolerate amb full unit around.  Def WBing/use of RW due to ABD "discomfort".     Stairs            Wheelchair Mobility    Modified Rankin (Stroke Patients Only)       Balance                                    Cognition                            Exercises      General Comments        Pertinent Vitals/Pain C/o 5/10 "sorness" ABD C/o MAX pain when coughing    Home Living                       Prior Function            PT Goals (current goals can now be found in the care plan section) Progress towards PT goals: Progressing toward goals    Frequency  Min 3X/week    PT Plan      Co-evaluation             End of Session Equipment Utilized During Treatment: Gait belt Activity Tolerance: Patient tolerated treatment well Patient left: in bed;with call bell/phone within reach;with family/visitor present     Time: 5732-2025 PT Time Calculation (min): 29 min  Charges:  $Gait Training: 8-22 mins $Therapeutic Activity: 8-22 mins                    G Codes:      Rica Koyanagi  PTA [facility]  Acute  Rehab Pager      [phone]

## 2014-03-24 ENCOUNTER — Inpatient Hospital Stay (HOSPITAL_COMMUNITY): Payer: Medicaid Other | Admitting: Anesthesiology

## 2014-03-24 ENCOUNTER — Encounter (HOSPITAL_COMMUNITY): Payer: Self-pay

## 2014-03-24 ENCOUNTER — Encounter (HOSPITAL_COMMUNITY): Payer: Medicaid Other | Admitting: Anesthesiology

## 2014-03-24 ENCOUNTER — Encounter (HOSPITAL_COMMUNITY)
Admission: RE | Disposition: A | Discharge status: Skilled Nursing Facility | Payer: Self-pay | Source: Ambulatory Visit | Attending: Surgery

## 2014-03-24 ENCOUNTER — Inpatient Hospital Stay (HOSPITAL_COMMUNITY): Payer: Medicaid Other

## 2014-03-24 DIAGNOSIS — T8859XA Other complications of anesthesia, initial encounter: Secondary | ICD-10-CM

## 2014-03-24 DIAGNOSIS — Z87891 Personal history of nicotine dependence: Secondary | ICD-10-CM

## 2014-03-24 DIAGNOSIS — J9589 Other postprocedural complications and disorders of respiratory system, not elsewhere classified: Secondary | ICD-10-CM

## 2014-03-24 DIAGNOSIS — J9600 Acute respiratory failure, unspecified whether with hypoxia or hypercapnia: Secondary | ICD-10-CM

## 2014-03-24 DIAGNOSIS — K55059 Acute (reversible) ischemia of intestine, part and extent unspecified: Secondary | ICD-10-CM

## 2014-03-24 DIAGNOSIS — S31109A Unspecified open wound of abdominal wall, unspecified quadrant without penetration into peritoneal cavity, initial encounter: Secondary | ICD-10-CM

## 2014-03-24 DIAGNOSIS — J690 Pneumonitis due to inhalation of food and vomit: Secondary | ICD-10-CM

## 2014-03-24 DIAGNOSIS — K929 Disease of digestive system, unspecified: Secondary | ICD-10-CM

## 2014-03-24 DIAGNOSIS — E669 Obesity, unspecified: Secondary | ICD-10-CM

## 2014-03-24 HISTORY — PX: COLON RESECTION: SHX5231

## 2014-03-24 HISTORY — PX: FLEXIBLE SIGMOIDOSCOPY: SHX5431

## 2014-03-24 HISTORY — DX: Other complications of anesthesia, initial encounter: T88.59XA

## 2014-03-24 HISTORY — DX: Pneumonitis due to inhalation of food and vomit: J69.0

## 2014-03-24 LAB — CBC
HCT: 33.3 % — ABNORMAL LOW (ref 39.0–52.0)
Hemoglobin: 11.4 g/dL — ABNORMAL LOW (ref 13.0–17.0)
MCH: 31.5 pg (ref 26.0–34.0)
MCHC: 34.2 g/dL (ref 30.0–36.0)
MCV: 92 fL (ref 78.0–100.0)
PLATELETS: 300 10*3/uL (ref 150–400)
RBC: 3.62 MIL/uL — AB (ref 4.22–5.81)
RDW: 13.5 % (ref 11.5–15.5)
WBC: 16 10*3/uL — AB (ref 4.0–10.5)

## 2014-03-24 LAB — BLOOD GAS, ARTERIAL
ACID-BASE DEFICIT: 2.7 mmol/L — AB (ref 0.0–2.0)
Acid-base deficit: 7.3 mmol/L — ABNORMAL HIGH (ref 0.0–2.0)
Acid-base deficit: 2.8 mmol/L — ABNORMAL HIGH (ref 0.0–2.0)
Acid-base deficit: 2.4 mmol/L — ABNORMAL HIGH (ref 0.0–2.0)
BICARBONATE: 21.6 meq/L (ref 20.0–24.0)
BICARBONATE: 21.8 meq/L (ref 20.0–24.0)
Bicarbonate: 21.7 mEq/L (ref 20.0–24.0)
Bicarbonate: 22.3 mEq/L (ref 20.0–24.0)
DRAWN BY: 331761
Drawn by: 103701
Drawn by: 331761
Drawn by: 331761
FIO2: 0.7 %
FIO2: 0.7 %
FIO2: 0.5 %
FIO2: 0.5 %
LHR: 30 {breaths}/min
LHR: 30 {breaths}/min
MECHVT: 500 mL
MECHVT: 500 mL
O2 SAT: 99.3 %
O2 SAT: 97.5 %
O2 Saturation: 90.9 %
O2 Saturation: 99.6 %
PATIENT TEMPERATURE: 99.1
PATIENT TEMPERATURE: 98
PCO2 ART: 38.3 mmHg (ref 35.0–45.0)
PEEP/CPAP: 12 cmH2O
PEEP/CPAP: 10 cmH2O
PEEP: 12 cmH2O
PEEP: 10 cmH2O
PH ART: 7.164 — AB (ref 7.350–7.450)
PO2 ART: 80.3 mmHg (ref 80.0–100.0)
PO2 ART: 96.7 mmHg (ref 80.0–100.0)
Patient temperature: 99
Patient temperature: 98
RATE: 20 resp/min
RATE: 35 resp/min
TCO2: 20.7 mmol/L (ref 0–100)
TCO2: 19.8 mmol/L (ref 0–100)
TCO2: 20.1 mmol/L (ref 0–100)
TCO2: 20.6 mmol/L (ref 0–100)
VT: 500 mL
VT: 380 mL
pCO2 arterial: 62.9 mmHg (ref 35.0–45.0)
pCO2 arterial: 39 mmHg (ref 35.0–45.0)
pCO2 arterial: 39.7 mmHg (ref 35.0–45.0)
pH, Arterial: 7.364 (ref 7.350–7.450)
pH, Arterial: 7.372 (ref 7.350–7.450)
pH, Arterial: 7.366 (ref 7.350–7.450)
pO2, Arterial: 176 mmHg — ABNORMAL HIGH (ref 80.0–100.0)
pO2, Arterial: 170 mmHg — ABNORMAL HIGH (ref 80.0–100.0)

## 2014-03-24 LAB — TYPE AND SCREEN
ABO/RH(D): A NEG
Antibody Screen: NEGATIVE

## 2014-03-24 LAB — CREATININE, SERUM: CREATININE: 0.66 mg/dL (ref 0.50–1.35)

## 2014-03-24 LAB — LACTIC ACID, PLASMA: Lactic Acid, Venous: 2.5 mmol/L — ABNORMAL HIGH (ref 0.5–2.2)

## 2014-03-24 LAB — POTASSIUM: POTASSIUM: 4.6 meq/L (ref 3.7–5.3)

## 2014-03-24 LAB — MRSA PCR SCREENING: MRSA by PCR: NEGATIVE

## 2014-03-24 SURGERY — COLON RESECTION LAPAROSCOPIC
Anesthesia: General | Site: Rectum

## 2014-03-24 MED ORDER — SODIUM CHLORIDE 0.9 % IV BOLUS (SEPSIS)
1000.0000 mL | Freq: Once | INTRAVENOUS | Status: AC
  Administered 2014-03-24 (×2): 1000 mL via INTRAVENOUS

## 2014-03-24 MED ORDER — LACTATED RINGERS IV BOLUS (SEPSIS): 1000.0000 mL | Freq: Once | INTRAVENOUS | Status: DC

## 2014-03-24 MED ORDER — HYDROMORPHONE HCL PF 1 MG/ML IJ SOLN: 0.2500 mg | INTRAMUSCULAR | Status: DC | PRN

## 2014-03-24 MED ORDER — FENTANYL CITRATE 0.05 MG/ML IJ SOLN
INTRAMUSCULAR | Status: AC
  Filled 2014-03-24: qty 5

## 2014-03-24 MED ORDER — SODIUM CHLORIDE 0.9 % IV SOLN
INTRAVENOUS | Status: DC
  Administered 2014-03-25 – 2014-03-26 (×3): via INTRAVENOUS
  Administered 2014-03-27: 1000 mL via INTRAVENOUS

## 2014-03-24 MED ORDER — FENTANYL BOLUS VIA INFUSION
50.0000 ug | INTRAVENOUS | Status: DC | PRN
  Filled 2014-03-24: qty 100

## 2014-03-24 MED ORDER — CHLORHEXIDINE GLUCONATE 0.12 % MT SOLN
15.0000 mL | Freq: Two times a day (BID) | OROMUCOSAL | Status: DC
  Administered 2014-03-24 – 2014-03-28 (×9): 15 mL via OROMUCOSAL
  Filled 2014-03-24 (×8): qty 15

## 2014-03-24 MED ORDER — DEXAMETHASONE SODIUM PHOSPHATE 10 MG/ML IJ SOLN
INTRAMUSCULAR | Status: AC
  Filled 2014-03-24: qty 1

## 2014-03-24 MED ORDER — LACTATED RINGERS IV SOLN
INTRAVENOUS | Status: DC | PRN
  Administered 2014-03-24: 12:00:00 via INTRAVENOUS

## 2014-03-24 MED ORDER — PIPERACILLIN-TAZOBACTAM 3.375 G IVPB
3.3750 g | Freq: Three times a day (TID) | INTRAVENOUS | Status: DC
  Administered 2014-03-24 – 2014-04-02 (×26): 3.375 g via INTRAVENOUS
  Filled 2014-03-24 (×26): qty 50

## 2014-03-24 MED ORDER — VASOPRESSIN 20 UNIT/ML IJ SOLN
0.0300 [IU]/min | INTRAMUSCULAR | Status: DC
  Administered 2014-03-24: 0.03 [IU]/min via INTRAVENOUS
  Filled 2014-03-24: qty 2.5

## 2014-03-24 MED ORDER — DEXTROSE 5 % IV SOLN
2.0000 g | INTRAVENOUS | Status: AC
  Administered 2014-03-24: 2 g via INTRAVENOUS
  Filled 2014-03-24: qty 2

## 2014-03-24 MED ORDER — NOREPINEPHRINE BITARTRATE 1 MG/ML IV SOLN
2.0000 ug/min | INTRAVENOUS | Status: DC
  Administered 2014-03-24: 20 ug/min via INTRAVENOUS
  Filled 2014-03-24: qty 16

## 2014-03-24 MED ORDER — PHENYLEPHRINE HCL 10 MG/ML IJ SOLN
INTRAMUSCULAR | Status: AC
  Filled 2014-03-24: qty 1

## 2014-03-24 MED ORDER — FENTANYL CITRATE 0.05 MG/ML IJ SOLN: 50.0000 ug | Freq: Once | INTRAMUSCULAR | Status: AC

## 2014-03-24 MED ORDER — CETYLPYRIDINIUM CHLORIDE 0.05 % MT LIQD
7.0000 mL | Freq: Four times a day (QID) | OROMUCOSAL | Status: DC
  Administered 2014-03-24 – 2014-03-27 (×9): 7 mL via OROMUCOSAL

## 2014-03-24 MED ORDER — PHENYLEPHRINE HCL 10 MG/ML IJ SOLN
10.0000 mg | INTRAVENOUS | Status: DC | PRN
  Administered 2014-03-24: 25 ug/min via INTRAVENOUS

## 2014-03-24 MED ORDER — SODIUM CHLORIDE 0.9 % IV SOLN: INTRAVENOUS | Status: DC

## 2014-03-24 MED ORDER — CHLORHEXIDINE GLUCONATE 4 % EX LIQD
1.0000 "application " | Freq: Once | CUTANEOUS | Status: AC
  Administered 2014-03-25: 1 via TOPICAL
  Filled 2014-03-24: qty 15

## 2014-03-24 MED ORDER — 0.9 % SODIUM CHLORIDE (POUR BTL) OPTIME
TOPICAL | Status: DC | PRN
  Administered 2014-03-24: 1600 mL

## 2014-03-24 MED ORDER — LACTATED RINGERS IV BOLUS (SEPSIS): 1000.0000 mL | Freq: Three times a day (TID) | INTRAVENOUS | Status: AC | PRN

## 2014-03-24 MED ORDER — SODIUM CHLORIDE 0.9 % IV SOLN
INTRAVENOUS | Status: DC | PRN
  Administered 2014-03-24 (×3): via INTRAVENOUS

## 2014-03-24 MED ORDER — SUCCINYLCHOLINE CHLORIDE 20 MG/ML IJ SOLN
INTRAMUSCULAR | Status: DC | PRN
  Administered 2014-03-24: 100 mg via INTRAVENOUS

## 2014-03-24 MED ORDER — NOREPINEPHRINE BITARTRATE 1 MG/ML IV SOLN
2.0000 ug/min | INTRAVENOUS | Status: DC
Start: 2014-03-24 — End: 2014-03-24
  Filled 2014-03-24: qty 4

## 2014-03-24 MED ORDER — METRONIDAZOLE IN NACL 5-0.79 MG/ML-% IV SOLN
500.0000 mg | Freq: Four times a day (QID) | INTRAVENOUS | Status: DC
  Administered 2014-03-24 – 2014-03-26 (×8): 500 mg via INTRAVENOUS
  Filled 2014-03-24 (×8): qty 100

## 2014-03-24 MED ORDER — PHENYLEPHRINE HCL 10 MG/ML IJ SOLN
INTRAMUSCULAR | Status: DC | PRN
  Administered 2014-03-24: 80 ug via INTRAVENOUS
  Administered 2014-03-24 (×2): 120 ug via INTRAVENOUS

## 2014-03-24 MED ORDER — SODIUM CHLORIDE 0.9 % IV SOLN
0.0000 ug/h | INTRAVENOUS | Status: DC
  Administered 2014-03-24: 100 ug/h via INTRAVENOUS
  Administered 2014-03-25: 250 ug/h via INTRAVENOUS
  Filled 2014-03-24 (×2): qty 50

## 2014-03-24 MED ORDER — PROPOFOL 10 MG/ML IV BOLUS
INTRAVENOUS | Status: DC | PRN
  Administered 2014-03-24: 170 mg via INTRAVENOUS

## 2014-03-24 MED ORDER — BUPIVACAINE-EPINEPHRINE 0.25% -1:200000 IJ SOLN
INTRAMUSCULAR | Status: AC
  Filled 2014-03-24: qty 1

## 2014-03-24 MED ORDER — PANTOPRAZOLE SODIUM 40 MG IV SOLR
40.0000 mg | Freq: Two times a day (BID) | INTRAVENOUS | Status: DC
  Administered 2014-03-24 – 2014-03-26 (×5): 40 mg via INTRAVENOUS
  Filled 2014-03-24 (×5): qty 40

## 2014-03-24 MED ORDER — LACTATED RINGERS IV SOLN
INTRAVENOUS | Status: DC
  Administered 2014-03-24: 1000 mL via INTRAVENOUS

## 2014-03-24 MED ORDER — MIDAZOLAM HCL 5 MG/5ML IJ SOLN
INTRAMUSCULAR | Status: DC | PRN
  Administered 2014-03-24: 2 mg via INTRAVENOUS

## 2014-03-24 MED ORDER — MIDAZOLAM HCL 2 MG/2ML IJ SOLN
INTRAMUSCULAR | Status: AC
  Filled 2014-03-24: qty 2

## 2014-03-24 MED ORDER — FENTANYL CITRATE 0.05 MG/ML IJ SOLN
INTRAMUSCULAR | Status: DC | PRN
  Administered 2014-03-24: 50 ug via INTRAVENOUS
  Administered 2014-03-24: 100 ug via INTRAVENOUS

## 2014-03-24 MED ORDER — LACTATED RINGERS IV SOLN: INTRAVENOUS | Status: DC

## 2014-03-24 MED ORDER — CEFOTETAN DISODIUM-DEXTROSE 2-2.08 GM-% IV SOLR
INTRAVENOUS | Status: AC
  Filled 2014-03-24: qty 50

## 2014-03-24 MED ORDER — CETYLPYRIDINIUM CHLORIDE 0.05 % MT LIQD: 7.0000 mL | Freq: Four times a day (QID) | OROMUCOSAL | Status: DC

## 2014-03-24 MED ORDER — PROPOFOL 10 MG/ML IV BOLUS
INTRAVENOUS | Status: AC
  Filled 2014-03-24: qty 20

## 2014-03-24 MED ORDER — CISATRACURIUM BESYLATE 20 MG/10ML IV SOLN
INTRAVENOUS | Status: AC
Start: 2014-03-24 — End: 2014-03-24
  Filled 2014-03-24: qty 20

## 2014-03-24 MED ORDER — CISATRACURIUM BESYLATE (PF) 10 MG/5ML IV SOLN
INTRAVENOUS | Status: DC | PRN
  Administered 2014-03-24: 6 mg via INTRAVENOUS
  Administered 2014-03-24: 8 mg via INTRAVENOUS
  Administered 2014-03-24: 5 mg via INTRAVENOUS
  Administered 2014-03-24 (×2): 10 mg via INTRAVENOUS

## 2014-03-24 MED ORDER — FENTANYL CITRATE 0.05 MG/ML IJ SOLN: 100.0000 ug | INTRAMUSCULAR | Status: DC | PRN

## 2014-03-24 MED ORDER — FENTANYL CITRATE 0.05 MG/ML IJ SOLN
100.0000 ug | INTRAMUSCULAR | Status: DC | PRN
  Administered 2014-03-24: 100 ug via INTRAVENOUS
  Filled 2014-03-24: qty 2

## 2014-03-24 MED ORDER — ONDANSETRON HCL 4 MG/2ML IJ SOLN
INTRAMUSCULAR | Status: AC
  Filled 2014-03-24: qty 2

## 2014-03-24 MED ORDER — CHLORHEXIDINE GLUCONATE 0.12 % MT SOLN: 15.0000 mL | Freq: Two times a day (BID) | OROMUCOSAL | Status: DC

## 2014-03-24 MED ORDER — CEFTRIAXONE SODIUM 2 G IJ SOLR: 2.0000 g | INTRAMUSCULAR | Status: DC

## 2014-03-24 SURGICAL SUPPLY — 89 items
APPLIER CLIP 5 13 M/L LIGAMAX5 (MISCELLANEOUS)
APPLIER CLIP ROT 10 11.4 M/L (STAPLE)
BLADE EXTENDED COATED 6.5IN (ELECTRODE) ×3 IMPLANT
BLADE HEX COATED 2.75 (ELECTRODE) ×6 IMPLANT
BLADE SURG SZ10 CARB STEEL (BLADE) ×3 IMPLANT
CABLE HIGH FREQUENCY MONO STRZ (ELECTRODE) ×3 IMPLANT
CANISTER SUCTION 2500CC (MISCELLANEOUS) IMPLANT
CATH KIT ON Q 7.5IN SLV (PAIN MANAGEMENT) IMPLANT
CATH URET ROBINSON 24FR STRL (CATHETERS) ×3 IMPLANT
CELLS DAT CNTRL 66122 CELL SVR (MISCELLANEOUS) IMPLANT
CLIP APPLIE 5 13 M/L LIGAMAX5 (MISCELLANEOUS) IMPLANT
CLIP APPLIE ROT 10 11.4 M/L (STAPLE) IMPLANT
COUNTER NEEDLE 20 DBL MAG RED (NEEDLE) ×3 IMPLANT
COVER MAYO STAND STRL (DRAPES) ×6 IMPLANT
DECANTER SPIKE VIAL GLASS SM (MISCELLANEOUS) ×3 IMPLANT
DRAIN CHANNEL 19F RND (DRAIN) ×3 IMPLANT
DRAPE INCISE IOBAN 66X45 STRL (DRAPES) ×3 IMPLANT
DRAPE LAPAROSCOPIC ABDOMINAL (DRAPES) ×3 IMPLANT
DRAPE LG THREE QUARTER DISP (DRAPES) ×6 IMPLANT
DRAPE UTILITY XL STRL (DRAPES) ×6 IMPLANT
DRAPE WARM FLUID 44X44 (DRAPE) ×3 IMPLANT
DRSG OPSITE POSTOP 4X10 (GAUZE/BANDAGES/DRESSINGS) IMPLANT
DRSG OPSITE POSTOP 4X6 (GAUZE/BANDAGES/DRESSINGS) IMPLANT
DRSG OPSITE POSTOP 4X8 (GAUZE/BANDAGES/DRESSINGS) IMPLANT
DRSG TEGADERM 2-3/8X2-3/4 SM (GAUZE/BANDAGES/DRESSINGS) IMPLANT
DRSG TEGADERM 4X4.75 (GAUZE/BANDAGES/DRESSINGS) ×6 IMPLANT
ELECT REM PT RETURN 9FT ADLT (ELECTROSURGICAL) ×3
ELECTRODE REM PT RTRN 9FT ADLT (ELECTROSURGICAL) ×2 IMPLANT
ENDOLOOP SUT PDS II  0 18 (SUTURE)
ENDOLOOP SUT PDS II 0 18 (SUTURE) IMPLANT
EVACUATOR SILICONE 100CC (DRAIN) ×3 IMPLANT
GAUZE SPONGE 2X2 8PLY STRL LF (GAUZE/BANDAGES/DRESSINGS) ×2 IMPLANT
GAUZE SPONGE 4X4 12PLY STRL (GAUZE/BANDAGES/DRESSINGS) IMPLANT
GLOVE BIO SURGEON STRL SZ7.5 (GLOVE) ×3 IMPLANT
GLOVE BIOGEL M 8.0 STRL (GLOVE) ×3 IMPLANT
GLOVE BIOGEL PI IND STRL 8 (GLOVE) ×2 IMPLANT
GLOVE BIOGEL PI INDICATOR 8 (GLOVE) ×1
GLOVE ECLIPSE 8.0 STRL XLNG CF (GLOVE) ×6 IMPLANT
GLOVE INDICATOR 8.0 STRL GRN (GLOVE) ×6 IMPLANT
GOWN STRL REUS W/TWL XL LVL3 (GOWN DISPOSABLE) ×12 IMPLANT
HOOK RETRACTION 12 ELAST STAY (MISCELLANEOUS) ×3 IMPLANT
KIT BASIN OR (CUSTOM PROCEDURE TRAY) ×3 IMPLANT
LEGGING LITHOTOMY PAIR STRL (DRAPES) ×3 IMPLANT
LUBRICANT JELLY K Y 4OZ (MISCELLANEOUS) IMPLANT
PENCIL BUTTON HOLSTER BLD 10FT (ELECTRODE) ×6 IMPLANT
PLUG CATH AND CAP STER (CATHETERS) ×3 IMPLANT
RETRACTOR LONE STAR DISPOSABLE (INSTRUMENTS) ×3 IMPLANT
RETRACTOR WND ALEXIS 25 LRG (MISCELLANEOUS) ×2 IMPLANT
RTRCTR WOUND ALEXIS 18CM MED (MISCELLANEOUS)
RTRCTR WOUND ALEXIS 25CM LRG (MISCELLANEOUS) ×3
SCISSORS LAP 5X35 DISP (ENDOMECHANICALS) ×3 IMPLANT
SEALER TISSUE G2 CVD JAW 35 (ENDOMECHANICALS) IMPLANT
SEALER TISSUE G2 CVD JAW 45CM (ENDOMECHANICALS)
SEALER TISSUE G2 STRG ARTC 35C (ENDOMECHANICALS) IMPLANT
SET IRRIG TUBING LAPAROSCOPIC (IRRIGATION / IRRIGATOR) ×3 IMPLANT
SLEEVE XCEL OPT CAN 5 100 (ENDOMECHANICALS) IMPLANT
SPONGE GAUZE 2X2 STER 10/PKG (GAUZE/BANDAGES/DRESSINGS) ×1
SPONGE LAP 18X18 X RAY DECT (DISPOSABLE) ×12 IMPLANT
STAPLER PROXIMATE 75MM BLUE (STAPLE) ×3 IMPLANT
STAPLER VISISTAT 35W (STAPLE) ×3 IMPLANT
SUCTION POOLE TIP (SUCTIONS) ×3 IMPLANT
SUT ETHILON 2 0 PS N (SUTURE) ×3 IMPLANT
SUT MNCRL AB 4-0 PS2 18 (SUTURE) ×3 IMPLANT
SUT PDS AB 1 CTX 36 (SUTURE) IMPLANT
SUT PDS AB 1 TP1 96 (SUTURE) ×6 IMPLANT
SUT PROLENE 0 CT 2 (SUTURE) IMPLANT
SUT SILK 2 0 (SUTURE) ×1
SUT SILK 2 0 SH CR/8 (SUTURE) ×6 IMPLANT
SUT SILK 2-0 18XBRD TIE 12 (SUTURE) ×2 IMPLANT
SUT SILK 3 0 (SUTURE) ×1
SUT SILK 3 0 SH CR/8 (SUTURE) ×3 IMPLANT
SUT SILK 3-0 18XBRD TIE 12 (SUTURE) ×2 IMPLANT
SUT VIC AB 2-0 SH 18 (SUTURE) ×3 IMPLANT
SUT VIC AB 3-0 SH 18 (SUTURE) ×6 IMPLANT
SUT VIC AB 3-0 SH 8-18 (SUTURE) ×6 IMPLANT
SYR BULB IRRIGATION 50ML (SYRINGE) ×3 IMPLANT
SYS LAPSCP GELPORT 120MM (MISCELLANEOUS)
SYSTEM LAPSCP GELPORT 120MM (MISCELLANEOUS) IMPLANT
TAPE UMBILICAL COTTON 1/8X30 (MISCELLANEOUS) IMPLANT
TOWEL OR 17X26 10 PK STRL BLUE (TOWEL DISPOSABLE) ×6 IMPLANT
TOWEL OR NON WOVEN STRL DISP B (DISPOSABLE) ×9 IMPLANT
TRAY FOLEY CATH 14FRSI W/METER (CATHETERS) IMPLANT
TRAY LAP CHOLE (CUSTOM PROCEDURE TRAY) ×3 IMPLANT
TROCAR BLADELESS OPT 5 100 (ENDOMECHANICALS) IMPLANT
TROCAR XCEL NON-BLD 11X100MML (ENDOMECHANICALS) IMPLANT
TUBING CONNECTING 10 (TUBING) IMPLANT
TUBING FILTER THERMOFLATOR (ELECTROSURGICAL) ×3 IMPLANT
TUNNELER SHEATH ON-Q 16GX12 DP (PAIN MANAGEMENT) IMPLANT
YANKAUER SUCT BULB TIP 10FT TU (MISCELLANEOUS) ×6 IMPLANT

## 2014-03-24 NOTE — Progress Notes (Signed)
[facility] Progress Note Patient Name: [name] DOB: [birth year] MRN: [id number]  Date of Service  03/24/2014   HPI/Events of Note   Nosocomial aspiration  eICU Interventions  Broaden abx to zosyn   Intervention Category Intermediate Interventions: Infection - evaluation and management  Kayleena Eke V. 03/24/2014, 9:03 PM

## 2014-03-24 NOTE — Progress Notes (Signed)
A large amount of GI contents emerged from the patients mouth and nose immediately after pushing the drugs on induction.  We suctioned the mouth, intubated, and suctioned the ETT before any attempted ventilation.  O2 sats have maintained well at 99% the entire case.  We will leave him intubated on a ventilator with a pulmonary consult for aspiration.  Talaysia Pinheiro MD

## 2014-03-24 NOTE — Op Note (Addendum)
03/20/2014 - 03/24/2014  4:16 PM  PATIENT:  [name]  58 y.o. male  Patient Care Team: [name], MD as PCP - General (Family Medicine) Satira Sark, MD as Consulting Physician (Cardiology) Danie Binder, MD as Consulting Physician (Gastroenterology) Milus Banister, MD as Attending Physician (Gastroenterology) Farrel Gobble, MD as Consulting Physician (Medical Oncology) Adin Hector, MD as Consulting Physician (General Surgery)  PRE-OPERATIVE DIAGNOSIS:  ileus, pain,possible colon ischemia  POST-OPERATIVE DIAGNOSIS:    Massive ileus Ischemia/necrosis of distal colon at coloanal anastomosis  PROCEDURE:    FLEXIBLE SIGMOIDOSCOPY Exploratory laparotomy Takedown of coloanal anastomosis Distal colectomy & end colostomy Wound vac  SURGEON:  Surgeon(s): Adin Hector, MD  ASSISTANT: Servando Snare, RNFA   ANESTHESIA:   general  EBL:  Total I/O In: 2350 [I.V.:2350] Out: 20 [Urine:600; Drains:80; Other:2700]  Delay start of Pharmacological VTE agent (>24hrs) due to surgical blood loss or risk of bleeding:  no  DRAINS: (19Fr) Blake drain(s) in the pelvis   SPECIMEN:  Source of Specimen:  Colon (splenic flexure)  DISPOSITION OF SPECIMEN:  PATHOLOGY  COUNTS:  YES  PLAN OF CARE: Admit to inpatient   PATIENT DISPOSITION:  ICU - intubated and critically ill.  INDICATION: Obese male with very distal rectal cancer s/p robotic very low LAR with coloanal anastomosis & diverting loop ileostomy.  Worsening ileus & tachycardia.  I recommended flex sig with probable Dx/ exp lap:  The anatomy & physiology of the digestive tract was discussed.  The pathophysiology was discussed.  Differential diagnosis such as perforated bowel, ischemia, necrosis, obstruction, etc, was discussed.   Natural history risks without surgery such as death was discussed.  I recommended abdominal exploration to diagnose & treat the source of the problem.  Laparoscopic & open  techniques were discussed.   Risks such as bleeding, infection, abscess, leak, reoperation, bowel resection, possible ostomy, hernia, heart attack, death, and other risks were discussed.   The risks of no intervention will lead to serious problems including death.   I expressed a good likelihood that surgery will address the problem.    Goals of post-operative recovery were discussed as well.  We will work to minimize complications although risks in an emergent setting are high.   Questions were answered.  The patient expressed understanding & wishes to proceed with surgery.      OR FINDINGS: The distal 10 cm of the colon above the colonanal anastomosis has major ischemia with patched of necrosis.  Patient with massively dilated small intestine & stomach.  No evidence of obstruction.  No evidence of torsion or volvulus.  No evidence of leak.  No evidence of perforated ulcer.  No evidence of metastatic disease.  No evidence of abscess.  No evidence of hematoma or active bleeding.  DESCRIPTION:   Patient was brought to the operating room.  Just prior to general anesthesia and intubation patient had an episode of massive emesis with aspiration.  Aggressively suctioned.  ETT tube placed.  Suctioned.  Switched to a larger ET tube.  Larger bore nasogastric tube placed.  500 mL of dark green material returned via the nasogastric tube.  Normoxia ensured.  Patient was placed in high lithotomy.  Surgical timeout confirmed our plan.  I began with flexible sigmoidoscopy.  Intubated the anus.  Encountered old blood in the distal colon.  Advanced scope into approximately the colon.  Colon looked nice and viable 20-30 cm.  Retracting back and it had moderate irrigation to  remove the blood I noted moderate ischemia at about 8 cm from the anus.  At about 5 cm from the anus ischemia had patches of necrosis.  There was no evidence of leak.  Given the evidence of shock, I felt the anastomosis needed to be taken down and  the area of severe ischemia/necrosis excised.  We therefore prepped and draped in patient in a sterile fashion..  I remove the sutures at the coloanal anastomosis.  I reduced out the colon and noted significant ischemia at the most distal end of the pouch.  I closed the colon opening with interrupted silk stitches.  Reduce that in the pelvis.  I closed the anal canal with interrupted 2-0 Vicryl suture.  Because the patient was massively dilated, I proceeded to laparotomy.  I entered the abdomen through a low midline incision. I used a wound protector.  I eviscerated massively dilated small bowel.  No evidence of volvulus.  No evidence of the small intestine trapped underneath the colon causing an internal hernia.  No evidence of twisting of the colon mesentery.  No evidence of twisting of the ileostomy.  Brought up the colon through the wound.   I stapled off the distal 15 cm of colon.  This left the distal transverse colon remaining.  Make sure it was not twisted or torsed.  I was able to find the stomach.  He was still rather dilated with fluid & enlarged.  We proceeded to suck out 3 L of contents out the stomach immediately & then after having milked small bowel retrograde back into the stomach.  As made in the abdominal cavity much better decompressed.  Did copious irrigation of the pelvis.  Ran the small bowel from the loop ileostomy back to the ligament of Treitz again.  There was no evidence of any torsion or volvulus.  No evidence of serosal or enterotomy injury.  Ileostomy had intubated well preoperatively and remained so.  Ileostomy looked viable.  There is no evidence of perforation.  There is no evidence of abscess.  No evidence of peritonitis in the abdominal cavity.  The ileum did seem mildly ischemic at first but I think it was more bruised.  It had peristalsis.  I used a Doppler & confirmed arterial pulses of the mesentery from the loop ileostomy to the proximal jejunum.  Again the ileostomy was  pink.  Because of the questionable ileal ischemia and shock, I did not take the loop ileostomy down at this time.   Reduced the bowel back in the abdomen.  I removed the drain in the left lower quadrant and placed a new drain in the right upper quadrant along the right gutter and down into the very deep pelvis.  Patient had a large volume of greater omentum remaining.  I used that to pack the pelvis to keep the small bowel away.  I matured the colostomy through a left lower quadrant paramedian circular incision than had been premarked.  Remove the disc of skin and subcutaneous tissue.  I split the fascia anterior rectus fascia transversely.  Dilated to 2 fingers.  Brought to the end of transverse colon up as a new end colostomy.  There was no twist or torsion on the colostomy.  I secured it on the peritoneal side using interrupted 2-0 Vicryl sutures x4.   I confirmed that the nasogastric tube was in good positioning in the stomach.  We changed gown and gloves.  I excised the umbilicus to a more flat abdomen  and given the fact that he had bilateral ostomies and an open midline wound.  I closed the midline fascia using #1 running PDS.  I left the skin open given the evidence of ischemia/necrosis.  I placed a wound VAC on the subcutaneous tissues.  I matured the colostomy using interrupted 3-0 Vicryl sutures.  Finger easily intubated into the peritoneum.  Again finger easily intubated into the loop ileostomy.  I placed a large firm red rubber catheter in the cephalad proximal limb end of the loop ileostomy to help decompress the bowel as well. Unsightly the patient intubated.  I discussed with Dr Dagmar Hait with pulmonary critical care.  The patient remained mildly tachycardic.  Blood pressure ranged from 80s to 120s.  Urine output dark but moderate volume.  I discussed with the patient's spouse and family.  Postoperative goals & need for ICU care and intubation discussed.  I did discuss pathology results with the  patient's wife about T2 N0.  While I noted  That the cure rate is good in the long-term,  I cautioned them that this is most likely a permanent end colostomy.

## 2014-03-24 NOTE — Progress Notes (Signed)
PT Cancellation Note  ___Treatment cancelled today due to medical issues with patient which prohibited therapy  _X_ Treatment cancelled today due to patient receiving procedure.  Surgery then transferred to ICU.  ___ Treatment cancelled today due to patient's refusal to participate   ___ Treatment cancelled today due to  Rica Koyanagi  PTA [facility]  Acute  Rehab Pager      [phone]

## 2014-03-24 NOTE — Anesthesia Postprocedure Evaluation (Signed)
  Anesthesia Post-op Note  Patient: [name]  Procedure(s) Performed: Procedure(s) (LRB): Diagnostic  laparotomy, takedown of coloanal anastamosis, end colostomy, with wound vac (N/A) FLEXIBLE SIGMOIDOSCOPY (N/A)  Patient Location ICU  Anesthesia Type: General  Level of Consciousness:    sedated  Airway and Oxygen Therapy:  On ventilator  Post-op Pain: mild  Post-op Assessment: Post-op Vital signs reviewed, Patient's Cardiovascular Status Stable, Respiratory Function Stable, Patent Airway and No signs of Nausea or vomiting  Last Vitals:  Filed Vitals:   03/24/14 1700  BP:   Pulse:   Temp:   Resp: 20    Post-op Vital Signs: stable   Complications:   Aspiration

## 2014-03-24 NOTE — Progress Notes (Signed)
eLink Physician-Brief Progress Note Patient Name: [name] DOB: Jul 24, 1956 MRN: [id number]  Date of Service  03/24/2014   HPI/Events of Note  Septic shock/ARDS Got 2.5 L   eICU Interventions  1L bolus Add levophed gtt Increase PEEP 12   Intervention Category Major Interventions: Sepsis - evaluation and management  Chanee Henrickson V. 03/24/2014, 5:30 PM

## 2014-03-24 NOTE — Care Management Note (Signed)
    Page 1 of 2   03/31/2014     11:17:07 AM CARE MANAGEMENT NOTE 03/31/2014  Patient:  [name], [name]   Account Number:  [id number]  Date Initiated:  03/24/2014  Documentation initiated by:  Sunday Spillers  Subjective/Objective Assessment:   58 yo male admitted s/p LAR, ileostomy. PTA lived at home with spouse.     Action/Plan:   Home when stable   Anticipated DC Date:  04/02/2014   Anticipated DC Plan:  SKILLED NURSING FACILITY  In-house referral  Clinical Social Research scientist (physical sciences)  Nutrition      DC Planning Services  CM consult      [facility] Choice  HOME HEALTH   Choice offered to / List presented to:  C-1 Patient           Status of service:  Completed, signed off Medicare Important Message given?   (If response is "NO", the following Medicare IM given date fields will be blank) Date Medicare IM given:   Medicare IM given by:   Date Additional Medicare IM given:   Additional Medicare IM given by:    Discharge Disposition:  [place]  Per UR Regulation:  Reviewed for med. necessity/level of care/duration of stay  If discussed at [place] of Stay Meetings, dates discussed:    Comments:  03-31-14 [place] 1115 Patient now requiring SNF for rehab, CSW aware.  [id number] Rosana Hoes, RN, BSN, CCM: CHART REVIEWED AND UPDATED. Patient to O.R. for bowel ischemia on [id number]/aspirated during intital intubation/taken to icu on vent and iv pressors. Patient was extubated to 40% fI02 am of 08052015/but remains septic in icu.  Next chart review due on [id number]. NO DISCHARGE NEEDS PRESENT AT THIS TIME WILL CONTINUE TO FOLLOW. CASE MANAGEMENT [phone]   03-23-14 Sunday Spillers RN CM Provided patient with [facility] list for choice, will discuss with wife, f/u later.

## 2014-03-24 NOTE — Progress Notes (Signed)
ANTIBIOTIC CONSULT NOTE - INITIAL  Pharmacy Consult for Zosyn Indication: Aspiration PNA  No Known Allergies  Patient Measurements: Height: 5\' 5"  (165.1 cm) Weight: 185 lb 6.5 oz (84.1 kg) IBW/kg (Calculated) : 61.5 Adjusted Body Weight:   Vital Signs: Temp: 98 F (36.7 C) (08/04 2000) Temp src: Oral (08/04 2000) BP: 113/78 mmHg (08/04 1948) Pulse Rate: 125 (08/04 1948) Intake/Output from previous day: 08/03 0701 - 08/04 0700 In: 1440 [P.O.:1440] Out: 2661 [Urine:2025; Emesis/NG output:250; Drains:386] Intake/Output from this shift: Total I/O In: 250 [I.V.:10; Other:120; NG/GT:120] Out: -   Labs:  Recent Labs  03/23/14 0500 03/24/14 0540  WBC 21.0* 16.0*  HGB 11.6* 11.4*  PLT 271 300  CREATININE 0.70 0.66   Estimated Creatinine Clearance: 101.6 ml/min (by C-G formula based on Cr of 0.66). No results found for this basename: VANCOTROUGH, Corlis Leak, VANCORANDOM, [place], GENTPEAK, GENTRANDOM, TOBRATROUGH, TOBRAPEAK, TOBRARND, AMIKACINPEAK, AMIKACINTROU, AMIKACIN,  in the last 72 hours   Microbiology: Recent Results (from the past 720 hour(s))  MRSA PCR SCREENING     Status: None   Collection Time    03/24/14  5:00 PM      Result Value Ref Range Status   MRSA by PCR NEGATIVE  NEGATIVE Final   Comment:            The GeneXpert MRSA Assay (FDA     approved for NASAL specimens     only), is one component of a     comprehensive MRSA colonization     surveillance program. It is not     intended to diagnose MRSA     infection nor to guide or     monitor treatment for     MRSA infections.    Medical History: Past Medical History  Diagnosis Date  . GERD (gastroesophageal reflux disease)   . Shortness of breath     due to weight gain poststopping smokeing  . Arthritis     Assessment: 53 yoM with newly diagnosed rectal cancer s/p low anterior resection and diverting loop ileostostomy on 7/31, developed ileus, tachycardia, emesis 8/03. He was taken to OR  for re-assessment and found to have ischemia of colonanal anastomosis. He developed emesis and aspiration during anesthesia induction. He returned to ICU on vent and hypotensive.  Pharmacy consulted to dose zosyn for aspiration PNA.   8/3 >> CTX >> 8/4 8/4 >> cefotetan preop x 1 8/4 >> zosyn  8/4 >> metronidazole >>   Tmax: 99.3 WBCs: 16K improved Renal: SCr 0.66, CrC l >100  8/4/ blood: ordered 8/4 MRSA PCR negative  Goal of Therapy:  Eradication of infection   Plan:  Zosyn 3.375g IV q8h (infuse over 4 hours)  Ralene Bathe, PharmD, BCPS 03/24/2014, 9:32 PM  Pager: [phone]

## 2014-03-24 NOTE — Progress Notes (Signed)
[facility]  [address] [place], [place] [address] Phone: [phone] FAX: [place] [phone] 1955-09-20  CARE TEAM:  PCP: Rubbie Battiest, MD  Outpatient Care Team: Patient Care Team: [name], MD as PCP - General (Family Medicine) Satira Sark, MD as Consulting Physician (Cardiology) Danie Binder, MD as Consulting Physician (Gastroenterology) Milus Banister, MD as Attending Physician (Gastroenterology) Farrel Gobble, MD as Consulting Physician (Medical Oncology) Adin Hector, MD as Consulting Physician (General Surgery)  Inpatient Treatment Team: Treatment Team: Attending Provider: Adin Hector, MD; Registered Nurse: Kristopher Oppenheim, RN; Physical Therapist: Nathanial Rancher, PTA; Registered Nurse: Claretta Fraise, RN   Subjective:  Appetite was better yest - ate 3 meals Nauseated early this AM - emesis x1 Some soreness.  But unchanged & mild.  Using PCA occasionally (1 dose q2-3H) Tired from being up  Objective:  Vital signs:  Filed Vitals:   03/23/14 2353 03/24/14 0423 03/24/14 0540 03/24/14 0606  BP:    104/78  Pulse:    121  Temp:    98.7 F (37.1 C)  TempSrc:    Oral  Resp: '23 20  22  ' Height:      Weight:   175 lb 7.8 oz (79.6 kg)   SpO2: 96% 98%  99%    Last BM Date: 03/21/14 (ileostomy)  Intake/Output   Yesterday:  08/03 0701 - 08/04 0700 In: 1440 [P.O.:1440] Out: 2661 [Urine:2025; Emesis/NG output:250; Drains:386] This shift:     Bowel function:  Flatus: n  BM: n  Drain: serosanguinous  Physical Exam:  General: Pt awake/alert/oriented x4 in no acute distress.  Tired but not toxic. Eyes: PERRL, normal EOM.  Sclera clear.  No icterus Neuro: CN II-XII intact w/o focal sensory/motor deficits. Lymph: No head/neck/groin lymphadenopathy Psych:  No delerium/psychosis/paranoia HENT: Normocephalic, Mucus membranes moist.  No thrush Neck:  Supple, No tracheal deviation Chest: No chest wall pain w good excursion CV:  Pulses intact.  Regular rhythm MS: Normal AROM mjr joints.  No obvious deformity Abdomen: Soft.  Mod distended.   Mildly tender at R ileostomy only.  No pelvis/LLQ pain.  Ileostomy edematous but viable.  Serous fluid in bag.  No evidence of peritonitis.  No incarcerated hernias. Ext:  SCDs BLE.  No mjr edema.  No cyanosis Skin: No petechiae / purpura   Problem List:   Principal Problem:   Rectal cancer, uT2uN0, s/p LAR/coloanal/ileostomy 03/20/2014 Active Problems:   Tobacco abuse, in remission   GERD (gastroesophageal reflux disease)   Assessment  [name]  58 y.o. male  4 Days Post-Op   PROCEDURE:  ROBOTIC ASSISTED low anterior resection  TRANSANAL TRANSABDOMINAL TME WITH HANDSEWN COLOANAL ANASTOMOSIS  splenic flexure mobilization  diverting loop ileostomy   Ileus with persistent tackycardia - worse this AM  Plan:  NGT  Plan flex sig poss ex lap to evaluate anastomosis & make sure no necrosis.  If necrotic, plan takedown w possible redo coloanal vs more likely colostomy.   WBC better & no peritonitis but w mixed picture, r/o necrosis & continue antibiotics.  D/w patient:  The anatomy & physiology of the digestive tract was discussed.  The pathophysiology of colon necrosisperforation was discussed.  Differential diagnosis such as perforated bowel, etc, was discussed.   Natural history risks without surgery such as death was discussed.  I recommended abdominal exploration to diagnose & treat the source of the problem.  Endoscopic,  laparoscopic & open techniques were discussed.   Risks such as bleeding, infection, abscess, leak, reoperation, bowel resection, possible ostomy, hernia, heart attack, death, and other risks were discussed.   The risks of no intervention will lead to serious problems including death.   I expressed a good likelihood that surgery will address the problem.    Goals of  post-operative recovery were discussed as well.  We will work to minimize complications although risks in an emergent setting are high.   Questions were answered.  The patient expressed understanding & wishes to proceed with surgery.      Continue PCA since helps mobility  IV ABx  Ileostomy care.  Hydration.  Follow electrolytes.  Stable K   PPI for GERD  Foley x3 days for possible retention & follow I&O  B blocker   Follow up on pathology   -VTE prophylaxis- SCDs, etc  -mobilize as tolerated to help recovery  I updated the patient's status to the patient.  Recommendations were made.  Questions were answered.  The patient expressed understanding & appreciation.   Adin Hector, M.D., F.A.C.S. Gastrointestinal and Minimally Invasive Surgery [facility] [address] [place],  [address] [phone] Main / Paging   03/24/2014   Results:   Labs: Results for orders placed during the hospital encounter of 03/20/14 (from the past 48 hour(s))  CBC     Status: Abnormal   Collection Time    03/23/14  5:00 AM      Result Value Ref Range   WBC 21.0 (*) 4.0 - 10.5 K/uL   RBC 3.69 (*) 4.22 - 5.81 MIL/uL   Hemoglobin 11.6 (*) 13.0 - 17.0 g/dL   HCT 34.3 (*) 39.0 - 52.0 %   MCV 93.0  78.0 - 100.0 fL   MCH 31.4  26.0 - 34.0 pg   MCHC 33.8  30.0 - 36.0 g/dL   RDW 13.4  11.5 - 15.5 %   Platelets 271  150 - 400 K/uL  BASIC METABOLIC PANEL     Status: Abnormal   Collection Time    03/23/14  5:00 AM      Result Value Ref Range   Sodium 133 (*) 137 - 147 mEq/L   Potassium 4.9  3.7 - 5.3 mEq/L   Chloride 98  96 - 112 mEq/L   CO2 28  19 - 32 mEq/L   Glucose, Bld 153 (*) 70 - 99 mg/dL   BUN 31 (*) 6 - 23 mg/dL   Comment: DELTA CHECK NOTED   Creatinine, Ser 0.70  0.50 - 1.35 mg/dL   Calcium 8.1 (*) 8.4 - 10.5 mg/dL   GFR calc non Af Amer >90  >90 mL/min   GFR calc Af Amer >90  >90 mL/min   Comment: (NOTE)     The eGFR has been  calculated using the CKD EPI equation.     This calculation has not been validated in all clinical situations.     eGFR's persistently <90 mL/min signify possible Chronic Kidney     Disease.   Anion gap 7  5 - 15  URINALYSIS, ROUTINE W REFLEX MICROSCOPIC     Status: None   Collection Time    03/23/14  9:24 AM      Result Value Ref Range   Color, Urine YELLOW  YELLOW   APPearance CLEAR  CLEAR   Specific Gravity, Urine 1.029  1.005 - 1.030   pH 6.5  5.0 - 8.0  Glucose, UA NEGATIVE  NEGATIVE mg/dL   Hgb urine dipstick NEGATIVE  NEGATIVE   Bilirubin Urine NEGATIVE  NEGATIVE   Ketones, ur NEGATIVE  NEGATIVE mg/dL   Protein, ur NEGATIVE  NEGATIVE mg/dL   Urobilinogen, UA 0.2  0.0 - 1.0 mg/dL   Nitrite NEGATIVE  NEGATIVE   Leukocytes, UA NEGATIVE  NEGATIVE   Comment: MICROSCOPIC NOT DONE ON URINES WITH NEGATIVE PROTEIN, BLOOD, LEUKOCYTES, NITRITE, OR GLUCOSE <1000 mg/dL.  CBC     Status: Abnormal   Collection Time    03/24/14  5:40 AM      Result Value Ref Range   WBC 16.0 (*) 4.0 - 10.5 K/uL   RBC 3.62 (*) 4.22 - 5.81 MIL/uL   Hemoglobin 11.4 (*) 13.0 - 17.0 g/dL   HCT 33.3 (*) 39.0 - 52.0 %   MCV 92.0  78.0 - 100.0 fL   MCH 31.5  26.0 - 34.0 pg   MCHC 34.2  30.0 - 36.0 g/dL   RDW 13.5  11.5 - 15.5 %   Platelets 300  150 - 400 K/uL  POTASSIUM     Status: None   Collection Time    03/24/14  5:40 AM      Result Value Ref Range   Potassium 4.6  3.7 - 5.3 mEq/L  CREATININE, SERUM     Status: None   Collection Time    03/24/14  5:40 AM      Result Value Ref Range   Creatinine, Ser 0.66  0.50 - 1.35 mg/dL   GFR calc non Af Amer >90  >90 mL/min   GFR calc Af Amer >90  >90 mL/min   Comment: (NOTE)     The eGFR has been calculated using the CKD EPI equation.     This calculation has not been validated in all clinical situations.     eGFR's persistently <90 mL/min signify possible Chronic Kidney     Disease.    Imaging / Studies: No results found.  Medications / Allergies:  per chart  Antibiotics: Anti-infectives   Start     Dose/Rate Route Frequency Ordered Stop   03/24/14 0730  cefoTEtan (CEFOTAN) 2 g in dextrose 5 % 50 mL IVPB    Comments:  Pharmacy may adjust dose strength for optimal dosing.   Send with patient on call to the OR.  Anest[facility] to complete antibiotic administration [facility]   2 g 100 mL/hr over 30 Minutes Intravenous On call to O.R. 03/24/14 0474208/05/15 0559   03/23/14 0830  cefTRIAXone (ROCEPHIN) 2 g in dextrose 5 % 50 mL IVPB    Comments:  Pharmacy may adjust dosing strength / duration / interval for maximal efficacy   2 g 100 mL/hr over 30 Minutes Intravenous Every 24 hours 03/23/14 0738     03/23/14 0800  metroNIDAZOLE (FLAGYL) IVPB 500 mg     500 mg 100 mL/hr over 60 Minutes Intravenous Every 6 hours 03/23/14 0738     03/20/14 2359  cefoTEtan (CEFOTAN) 2 g in dextrose 5 % 50 mL IVPB     2 g 100 mL/hr over 30 Minutes Intravenous Every 12 hours 03/20/14 1704 03/20/14 2345   03/20/14 1228  clindamycin (CLEOCIN) 900 mg, gentamicin (GARAMYCIN) 240 mg in sodium chloride 0.9 % 1,000 mL for intraperitoneal lavage  Status:  Discontinued       As needed 03/20/14 1228 03/20/14 1329   03/20/14 0518  cefoTEtan (CEFOTAN) 2 g in dextrose 5 %  50 mL IVPB     2 g 100 mL/hr over 30 Minutes Intravenous On call to O.R. 03/20/14 0518 03/20/14 1259   03/20/14 0000  clindamycin (CLEOCIN) 900 mg, gentamicin (GARAMYCIN) 240 mg in sodium chloride 0.9 % 1,000 mL for intraperitoneal lavage  Status:  Discontinued    Comments:  Pharmacy may adjust dosing strength, schedule, rate of infusion, etc as needed to optimize therapy    Intraperitoneal To Surgery 03/19/14 1341 03/20/14 1453   03/19/14 1345  clindamycin (CLEOCIN) 900 mg, gentamicin (GARAMYCIN) 240 mg in sodium chloride 0.9 % 1,000 mL for intraperitoneal lavage  Status:  Discontinued    Comments:  Pharmacy may adjust dosing strength, schedule, rate of infusion, etc as  needed to optimize therapy   1 application Intraperitoneal To Surgery 03/19/14 1339 03/19/14 1341       Note: Portions of this report may have been transcribed using voice recognition software. Every effort was made to ensure accuracy; however, inadvertent computerized transcription errors may be present.   Any transcriptional errors that result from this process are unintentional.

## 2014-03-24 NOTE — Transfer of Care (Signed)
Immediate Anesthesia Transfer of Care Note  Patient: [name]  Procedure(s) Performed: Procedure(s): Diagnostic  laparotomy, takedown of coloanal anastamosis, end colostomy, with wound vac (N/A) FLEXIBLE SIGMOIDOSCOPY (N/A)  Patient Location: PACU and ICU  Anesthesia Type:General  Level of Consciousness: unresponsive and Patient remains intubated per anesthesia plan  Airway & Oxygen Therapy: Patient remains intubated per anesthesia plan and Patient placed on Ventilator (see vital sign flow sheet for setting)  Post-op Assessment: Report given to PACU RN and Post -op Vital signs reviewed and stable  Post vital signs: Reviewed and stable  Complications: No apparent anesthesia complications

## 2014-03-24 NOTE — Anesthesia Preprocedure Evaluation (Addendum)
Anesthesia Evaluation  Patient identified by MRN, date of birth, ID band Patient awake    Reviewed: Allergy & Precautions, H&P , NPO status , Patient's Chart, lab work & pertinent test results, reviewed documented beta blocker date and time   Airway Mallampati: II TM Distance: >3 FB Neck ROM: full    Dental  (+) Poor Dentition, Missing, Dental Advisory Given   Pulmonary neg pulmonary ROS, shortness of breath and with exertion, former smoker,  breath sounds clear to auscultation  Pulmonary exam normal       Cardiovascular Exercise Tolerance: Good negative cardio ROS  Rhythm:regular Rate:Normal     Neuro/Psych negative neurological ROS  negative psych ROS   GI/Hepatic negative GI ROS, Neg liver ROS, GERD-  Medicated and Controlled,  Endo/Other  negative endocrine ROS  Renal/GU negative Renal ROS  negative genitourinary   Musculoskeletal   Abdominal   Peds  Hematology negative hematology ROS (+)   Anesthesia Other Findings   Reproductive/Obstetrics negative OB ROS                       Anesthesia Physical Anesthesia Plan  ASA: III  Anesthesia Plan: General   Post-op Pain Management:    Induction: Intravenous, Rapid sequence and Cricoid pressure planned  Airway Management Planned: Oral ETT  Additional Equipment:   Intra-op Plan:   Post-operative Plan: Extubation in OR  Informed Consent: I have reviewed the patients History and Physical, chart, labs and discussed the procedure including the risks, benefits and alternatives for the proposed anesthesia with the patient or authorized representative who has indicated his/her understanding and acceptance.   Dental Advisory Given  Plan Discussed with: CRNA and Surgeon  Anesthesia Plan Comments:        Anesthesia Quick Evaluation                                  Anesthesia Evaluation  Patient identified by MRN, date of birth, ID  band Patient awake  General Assessment Comment:Rectal Ca  Reviewed: Allergy & Precautions, H&P , NPO status , Patient's Chart, lab work & pertinent test results  Airway Mallampati: II TM Distance: >3 FB Neck ROM: Full    Dental no notable dental hx.    Pulmonary neg pulmonary ROS, former smoker,  breath sounds clear to auscultation  Pulmonary exam normal       Cardiovascular Rhythm:Regular Rate:Normal    Stress data:   Patientatient exercised 6 min 47 sec to a peak HR of 146 bpm  This was 89% predicted maximall.  Peak BP 173/99.  Patient experienced no CP  EKG showed no ST changes to suggest ischemia   Impression:  Exercise stress test:  Clinically and electrically negative for ischemia.                Neuro/Psych negative neurological ROS  negative psych ROS   GI/Hepatic Neg liver ROS, GERD-  Medicated,  Endo/Other  negative endocrine ROS  Renal/GU negative Renal ROS  negative genitourinary   Musculoskeletal negative musculoskeletal ROS (+)   Abdominal   Peds negative pediatric ROS (+)  Hematology negative hematology ROS (+)   Anesthesia Other Findings   Reproductive/Obstetrics negative OB ROS                        Anesthesia Physical Anesthesia Plan  ASA: II  Anesthesia Plan: General   Post-op Pain  Management:    Induction: Intravenous  Airway Management Planned: Oral ETT  Additional Equipment:   Intra-op Plan:   Post-operative Plan: Extubation in OR  Informed Consent: I have reviewed the patients History and Physical, chart, labs and discussed the procedure including the risks, benefits and alternatives for the proposed anesthesia with the patient or authorized representative who has indicated his/her understanding and acceptance.   Dental advisory given  Plan Discussed with: CRNA and Surgeon  Anesthesia Plan Comments:         Anesthesia Quick Evaluation

## 2014-03-24 NOTE — Consult Note (Signed)
PULMONARY / CRITICAL CARE MEDICINE   Name: [name] MRN: [id number] DOB: 02/28/1956    ADMISSION DATE:  03/20/2014 CONSULTATION DATE:  03/24/2014  REFERRING MD :  Michael Boston  CHIEF COMPLAINT:  Abdominal pain  INITIAL PRESENTATION:  58 yo male former smoker with rectal bleeding found to have rectal cancer.  He had robotic assisted low anterior resection and diverting loop ileostomy on 03/20/14.  He developed ileus, tachycardia, emesis 8/03.  He was taken to OR for re-assessment and found to have ischemia of colonanal anastomosis.  He developed emesis and aspiration during anesthesia induction.  He returned to ICU on vent and hypotensive.  PCCM consulted to assist with management.  STUDIES:   SIGNIFICANT EVENTS: 7/31 Low anterior resection and diverting loop ileostomy 8/03 tachycardia, emesis, ileus 8/04 To OR > distal colectomy and end colostomy with wound vac, emesis/aspiration during anesthesia induction   HISTORY OF PRESENT ILLNESS:   58 yo male former smoker with rectal bleeding found to have rectal cancer.  He had robotic assisted low anterior resection and diverting loop ileostomy on 03/20/14.  He developed ileus, tachycardia, emesis 8/03.  He was taken to OR for re-assessment and found to have ischemia of colonanal anastomosis.  He developed emesis and aspiration during anesthesia induction.  He returned to ICU on vent and hypotensive.  PCCM consulted to assist with management.  PAST MEDICAL HISTORY :  Past Medical History  Diagnosis Date  . GERD (gastroesophageal reflux disease)   . Shortness of breath     due to weight gain poststopping smokeing  . Arthritis    Past Surgical History  Procedure Laterality Date  . Colonoscopy N/A 12/22/2013    Procedure: COLONOSCOPY;  Surgeon: Danie Binder, MD;  Location: [facility];  Service: Endoscopy;  Laterality: N/A;  8:30  . Esophagogastroduodenoscopy N/A 12/22/2013    Procedure: ESOPHAGOGASTRODUODENOSCOPY (EGD);  Surgeon:  Danie Binder, MD;  Location: [facility];  Service: Endoscopy;  Laterality: N/A;  . Eus N/A 01/01/2014    Procedure: LOWER ENDOSCOPIC ULTRASOUND (EUS);  Surgeon: Milus Banister, MD;  Location: [facility];  Service: Endoscopy;  Laterality: N/A;  . Hip fracture surgery Left age 80 on 04-06-1970  . Eye reattachment and ear reattachment  Right 1971   Prior to Admission medications   Medication Sig Start Date End Date Taking? Authorizing Provider  aspirin EC 81 MG tablet Take 81 mg by mouth daily.    Historical Provider, MD  Multiple Vitamins-Minerals (ONE DAILY FOR MEN 50+ ADVANCED PO) Take 1 tablet by mouth daily.    Historical Provider, MD  Omega-3 Fatty Acids (FISH OIL) 1000 MG CAPS Take 1,000 mg by mouth daily.    Historical Provider, MD  omeprazole (PRILOSEC) 20 MG capsule Take 20 mg by mouth 2 (two) times daily.    Historical Provider, MD  ondansetron (ZOFRAN) 4 MG tablet Take 1 tablet (4 mg total) by mouth every 8 (eight) hours as needed for nausea. 03/20/14   Adin Hector, MD  OVER THE COUNTER MEDICATION Take 500 mg by mouth 3 (three) times daily. [place]    Historical Provider, MD  oxyCODONE (OXY IR/ROXICODONE) 5 MG immediate release tablet Take 1-2 tablets (5-10 mg total) by mouth every 4 (four) hours as needed for moderate pain, severe pain or breakthrough pain. 03/20/14   Adin Hector, MD  saw palmetto 160 MG capsule Take 160 mg by mouth daily.    Historical Provider, MD  vitamin C (ASCORBIC ACID) 500  MG tablet Take 500 mg by mouth 2 (two) times daily.    Historical Provider, MD   No Known Allergies  FAMILY HISTORY:  Family History  Problem Relation Age of Onset  . Colon polyps Neg Hx   . Colon cancer Sister    SOCIAL HISTORY:  reports that he quit smoking about 2 years ago. His smoking use included Cigars. He has never used smokeless tobacco. He reports that he does not drink alcohol or use illicit drugs.  REVIEW OF SYSTEMS:  Unable to obtain.  SUBJECTIVE:   VITAL  SIGNS: Temp:  [98.4 F (36.9 C)-99.3 F (37.4 C)] 98.4 F (36.9 C) (08/04 1109) Pulse Rate:  [97-129] 97 (08/04 1109) Resp:  [15-23] 15 (08/04 1109) BP: (104-139)/(74-96) 122/74 mmHg (08/04 1109) SpO2:  [95 %-99 %] 95 % (08/04 1109) Weight:  [175 lb 7.8 oz (79.6 kg)] 175 lb 7.8 oz (79.6 kg) (08/04 0540) HEMODYNAMICS:   VENTILATOR SETTINGS:   INTAKE / OUTPUT:  Intake/Output Summary (Last 24 hours) at 03/24/14 1621 Last data filed at 03/24/14 1514  Gross per 24 hour  Intake   2110 ml  Output   5535 ml  Net  -3425 ml    PHYSICAL EXAMINATION: General: returned from OR Neuro:  Sedated/paralyzed HEENT:  ETT, NG tube in place Cardiovascular:  Regular, tachycardic Lungs:  B/l rales Abdomen:  Wound vac, ileostomy, colostomy in place Musculoskeletal:  No edema Skin:  No rashes  LABS:  CBC  Recent Labs Lab 03/21/14 0613 03/23/14 0500 03/24/14 0540  WBC 11.6* 21.0* 16.0*  HGB 12.6* 11.6* 11.4*  HCT 37.0* 34.3* 33.3*  PLT 304 271 300   Coag's No results found for this basename: APTT, INR,  in the last 168 hours  BMET  Recent Labs Lab 03/21/14 0613 03/23/14 0500 03/24/14 0540  NA 136* 133*  --   K 4.4 4.9 4.6  CL 100 98  --   CO2 27 28  --   BUN 20 31*  --   CREATININE 1.02 0.70 0.66  GLUCOSE 124* 153*  --    Electrolytes  Recent Labs Lab 03/21/14 0613 03/23/14 0500  CALCIUM 8.6 8.1*  MG 2.0  --    Sepsis Markers No results found for this basename: LATICACIDVEN, PROCALCITON, O2SATVEN,  in the last 168 hours  ABG No results found for this basename: PHART, PCO2ART, PO2ART,  in the last 168 hours  Liver Enzymes No results found for this basename: AST, ALT, ALKPHOS, BILITOT, ALBUMIN,  in the last 168 hours  Cardiac Enzymes No results found for this basename: TROPONINI, PROBNP,  in the last 168 hours  Glucose No results found for this basename: GLUCAP,  in the last 168 hours  Imaging No results found.   ASSESSMENT / PLAN:  PULMONARY OETT  8/04 >>  A: Acute respiratory failure 2nd to aspiration. P:   Full vent support >> start with 8 cc/kg for Vt and adjust to 6 cc/kg as needed Adjust PEEP/FiO2 to keep SpO2 90 to 94% Bronchial hygiene F/u CXR, ABG  CARDIOVASCULAR Lt PICC 8/01 >> Rt radial aline 8/04 >>  A:  Hypovolemia >> weaned off pressors after return from OR 8/04. P:  Continue IV fluids Monitor hemodynamics  RENAL A:   Oliguria >> likely from hypovolemia. P:   Keep foley in Monitor renal fx, urine outpt, electrolytes  GASTROINTESTINAL A:   Rectal cancer s/p resection complicated by anastomosis ischemia. P:   Post-op care, nutrition per [facility] Protonix BID  HEMATOLOGIC  A:   Leukocystosis. P:  F/u CBC SQ heparin for DVT prevention  INFECTIOUS A:   Sepsis with concern for peritonitis. Large volume aspiration during anesthesia induction 8/04. P:   Sputum 8/04 >> Day 2/x rocephin, flagyl started 8/03  ENDOCRINE A:   No issues.   P:   Monitor blood sugar on BMET  NEUROLOGIC A:   Sedation. Post-op pain control. P:   RASS goal: -1 PAD 1 sedation protocol  TODAY'S SUMMARY: 58 yo male with rectal cancer s/p resection complicated by ileus and anastomosis ischemia required re-exploration.  He developed large volume emesis and aspiration prior to anesthesia induction.  Required pressures in OR, but weaned off in ICU.  Main concern will be for development of ARDS.  CC time 50 minutes.  Chesley Mires, MD [facility] Pulmonary/Critical Care 03/24/2014, 4:57 PM Pager:  [phone] After 3pm call: [phone]

## 2014-03-25 ENCOUNTER — Inpatient Hospital Stay (HOSPITAL_COMMUNITY): Payer: Medicaid Other

## 2014-03-25 ENCOUNTER — Telehealth (INDEPENDENT_AMBULATORY_CARE_PROVIDER_SITE_OTHER): Payer: Self-pay

## 2014-03-25 ENCOUNTER — Encounter (HOSPITAL_COMMUNITY): Payer: Self-pay | Admitting: Surgery

## 2014-03-25 LAB — BLOOD GAS, ARTERIAL
ACID-BASE DEFICIT: 2 mmol/L (ref 0.0–2.0)
Bicarbonate: 22.6 mEq/L (ref 20.0–24.0)
Drawn by: 331761
FIO2: 0.4 %
MECHVT: 380 mL
O2 Saturation: 97.4 %
PCO2 ART: 40.8 mmHg (ref 35.0–45.0)
PEEP: 8 cmH2O
PO2 ART: 99.9 mmHg (ref 80.0–100.0)
Patient temperature: 99
RATE: 35 resp/min
TCO2: 20.8 mmol/L (ref 0–100)
pH, Arterial: 7.364 (ref 7.350–7.450)

## 2014-03-25 LAB — BASIC METABOLIC PANEL
Anion gap: 11 (ref 5–15)
BUN: 31 mg/dL — ABNORMAL HIGH (ref 6–23)
CHLORIDE: 101 meq/L (ref 96–112)
CO2: 23 meq/L (ref 19–32)
Calcium: 7.8 mg/dL — ABNORMAL LOW (ref 8.4–10.5)
Creatinine, Ser: 0.77 mg/dL (ref 0.50–1.35)
GFR calc Af Amer: >90 mL/min (ref >90)
GFR calc non Af Amer: >90 mL/min (ref >90)
GLUCOSE: 147 mg/dL — AB (ref 70–99)
Potassium: 5.1 mEq/L (ref 3.7–5.3)
SODIUM: 135 meq/L — AB (ref 137–147)

## 2014-03-25 LAB — CBC
HCT: 28.7 % — ABNORMAL LOW (ref 39.0–52.0)
Hemoglobin: 9.8 g/dL — ABNORMAL LOW (ref 13.0–17.0)
MCH: 31.4 pg (ref 26.0–34.0)
MCHC: 34.1 g/dL (ref 30.0–36.0)
MCV: 92 fL (ref 78.0–100.0)
Platelets: 245 10*3/uL (ref 150–400)
RBC: 3.12 MIL/uL — AB (ref 4.22–5.81)
RDW: 13.8 % (ref 11.5–15.5)
WBC: 21.8 10*3/uL — AB (ref 4.0–10.5)

## 2014-03-25 LAB — LACTIC ACID, PLASMA: LACTIC ACID, VENOUS: 2.1 mmol/L (ref 0.5–2.2)

## 2014-03-25 MED ORDER — FENTANYL CITRATE 0.05 MG/ML IJ SOLN
25.0000 ug | INTRAMUSCULAR | Status: DC | PRN
  Administered 2014-03-25 – 2014-03-26 (×8): 100 ug via INTRAVENOUS
  Filled 2014-03-25 (×8): qty 2

## 2014-03-25 MED ORDER — PHENOL 1.4 % MT LIQD
1.0000 | OROMUCOSAL | Status: DC | PRN
  Administered 2014-03-25: 1 via OROMUCOSAL
  Filled 2014-03-25: qty 177

## 2014-03-25 NOTE — Progress Notes (Signed)
PULMONARY / CRITICAL CARE MEDICINE   Name: [name] MRN: [id number] DOB: 05-02-1956    ADMISSION DATE:  03/20/2014 CONSULTATION DATE:  03/24/2014  REFERRING MD :  Michael Boston  CHIEF COMPLAINT:  Abdominal pain  INITIAL PRESENTATION:  58 yo male former smoker with rectal bleeding found to have rectal cancer.  He had robotic assisted low anterior resection and diverting loop ileostomy on 03/20/14.  He developed ileus, tachycardia, emesis 8/03.  He was taken to OR for re-assessment and found to have ischemia of colonanal anastomosis.  He developed emesis and aspiration during anesthesia induction.  He returned to ICU on vent and hypotensive.  PCCM consulted to assist with management.  STUDIES:   SIGNIFICANT EVENTS: 7/31 Low anterior resection and diverting loop ileostomy 8/03 tachycardia, emesis, ileus 8/04 To OR > distal colectomy and end colostomy with wound vac, emesis/aspiration during anesthesia induction   HISTORY OF PRESENT ILLNESS:   58 yo male former smoker with rectal bleeding found to have rectal cancer.  He had robotic assisted low anterior resection and diverting loop ileostomy on 03/20/14.  He developed ileus, tachycardia, emesis 8/03.  He was taken to OR for re-assessment and found to have ischemia of colonanal anastomosis.  He developed emesis and aspiration during anesthesia induction.  He returned to ICU on vent and hypotensive.  PCCM consulted to assist with management.  SUBJECTIVE:  Awake and tolerating PSV Small amt abd pain on fentanyl gtt  VITAL SIGNS: Temp:  [98 F (36.7 C)-99.1 F (37.3 C)] 98.6 F (37 C) (08/05 0800) Pulse Rate:  [97-133] 101 (08/05 0900) Resp:  [10-45] 20 (08/05 0900) BP: (113-138)/(74-89) 131/84 mmHg (08/05 0800) SpO2:  [86 %-100 %] 100 % (08/05 0900) FiO2 (%):  [40 %-70 %] 40 % (08/05 0900) Weight:  [84.1 kg (185 lb 6.5 oz)-86.2 kg (190 lb 0.6 oz)] 86.2 kg (190 lb 0.6 oz) (08/05 0515) HEMODYNAMICS:   VENTILATOR SETTINGS: Vent  Mode:  [-] PSV;CPAP FiO2 (%):  [40 %-70 %] 40 % Set Rate:  [20 bmp-35 bmp] 35 bmp Vt Set:  [380 mL-500 mL] 380 mL PEEP:  [5 cmH20-12 cmH20] 5 cmH20 Pressure Support:  [10 cmH20] 10 cmH20 Plateau Pressure:  [21 cmH20-26 cmH20] 26 cmH20 INTAKE / OUTPUT:  Intake/Output Summary (Last 24 hours) at 03/25/14 0925 Last data filed at 03/25/14 0900  Gross per 24 hour  Intake 8662.6 ml  Output   7430 ml  Net 1232.6 ml    PHYSICAL EXAMINATION: General: intubated, sitting up  Neuro:  Awake and interacting, follows commands HEENT:  ETT, NG tube in place Cardiovascular:  Regular, tachycardic Lungs:  B/l rales Abdomen:  Wound vac, ileostomy, colostomy in place Musculoskeletal:  No edema Skin:  No rashes  LABS:  CBC  Recent Labs Lab 03/21/14 0613 03/23/14 0500 03/24/14 0540  WBC 11.6* 21.0* 16.0*  HGB 12.6* 11.6* 11.4*  HCT 37.0* 34.3* 33.3*  PLT 304 271 300   Coag's No results found for this basename: APTT, INR,  in the last 168 hours  BMET  Recent Labs Lab 03/21/14 0613 03/23/14 0500 03/24/14 0540  NA 136* 133*  --   K 4.4 4.9 4.6  CL 100 98  --   CO2 27 28  --   BUN 20 31*  --   CREATININE 1.02 0.70 0.66  GLUCOSE 124* 153*  --    Electrolytes  Recent Labs Lab 03/21/14 0613 03/23/14 0500  CALCIUM 8.6 8.1*  MG 2.0  --    Sepsis  Markers  Recent Labs Lab 03/24/14 1849 03/25/14 0551  LATICACIDVEN 2.5* 2.1    ABG  Recent Labs Lab 03/24/14 2131 03/24/14 2252 03/25/14 0356  PHART 7.372 7.366 7.364  PCO2ART 38.3 39.7 40.8  PO2ART 96.7 170.0* 99.9    Liver Enzymes No results found for this basename: AST, ALT, ALKPHOS, BILITOT, ALBUMIN,  in the last 168 hours  Cardiac Enzymes No results found for this basename: TROPONINI, PROBNP,  in the last 168 hours  Glucose No results found for this basename: GLUCAP,  in the last 168 hours  Imaging Dg Chest Port 1 View  03/24/2014   CLINICAL DATA:  History of colorectal carcinoma, aspiration by history   EXAM: PORTABLE CHEST - 1 VIEW  COMPARISON:  Chest x-ray of 12/29/2013  FINDINGS: There is airspace disease in the perihilar regions bilaterally which may represent pneumonia or edema. Followup chest x-ray is recommended. An endotracheal tube is present with the tip approximately 3.4 cm above the carina. Heart size is stable. NG tube coils in the fundus of the stomach.  IMPRESSION: 1. Bilateral perihilar airspace disease. Possible pneumonia but cannot exclude edema. Recommend followup. 2. Tip of endotracheal tube approximately 3.4 cm above the carina P   Electronically Signed   By: Ivar Drape M.D.   On: 03/24/2014 17:13     ASSESSMENT / PLAN:  PULMONARY OETT 8/04 >> A: Acute respiratory failure Aspiration event with presumed pneumonitis and high risk pneumonia P:   Full vent support >> start with 8 cc/kg for Vt and adjust to 6 cc/kg as needed Adjust PEEP/FiO2 to keep SpO2 90 to 94% Tolerating PSV, CXR with some evidence interstitial infiltrates but improved aeration from 8/4 > would like to push for extubation Bronchial hygiene Empiric abx as below F/u CXR  CARDIOVASCULAR Lt PICC 8/01 >> Rt radial aline 8/04 >> A:  Hypovolemia Shock, improving and pressors weaning am 8/5 P:  Continue IV fluids Monitor hemodynamics Goal wean norepi to off Stop vasopressin now  RENAL A:   Oliguria >> likely from hypovolemia. Improving P:   Keep foley in Monitor renal fx, urine outpt, electrolytes  GASTROINTESTINAL A:   Rectal cancer s/p resection complicated by anastomosis ischemia. P:   Post-op care, nutrition per [facility] Protonix BID  HEMATOLOGIC A:   Leukocystosis. P:  F/u CBC SQ heparin for DVT prevention  INFECTIOUS A:   Sepsis with concern for peritonitis. Large volume aspiration during anesthesia induction 8/04. P:   Sputum 8/04 >> Blood 8/4 >>  Rocephin 8/3 >> 8/4  flagyl 8/03 >>  Zosyn 8/4 >>  abx changed to zosyn 8/4, suspect flagyl can be stopped. Will defer timing  to [facility]  ENDOCRINE A:   No issues.   P:   Monitor blood sugar on BMET  NEUROLOGIC A:   Sedation. Post-op pain control. P:   RASS goal: -1 PAD 1 sedation protocol  TODAY'S SUMMARY: 58 yo male with rectal cancer s/p resection complicated by ileus and anastomosis ischemia required re-exploration.  He developed large volume emesis and aspiration prior to anesthesia induction.  Required pressors post-op.  Main concern will be for development of ARDS, but CXR and BST reassuring on 8/5. Goal extubation  CC time 50 minutes.  Baltazar Apo, MD, PhD 03/25/2014, 9:34 AM [facility] [phone] or if no answer [phone]

## 2014-03-25 NOTE — Progress Notes (Signed)
[id number] Rosana Hoes, RN, BSN, CCM:                                                                  CHART REVIEWED AND UPDATED.  On vent due to aspiration post-op and iv pressors.  Extubated on [id number].  Iv pressors dcd [id number]  Next chart review due on [id number]. NO DISCHARGE NEEDS PRESENT AT THIS TIME WILL CONTINUE TO FOLLOW.   CASE MANAGEMENT [phone]

## 2014-03-25 NOTE — Procedures (Signed)
Extubation Procedure Note  Patient Details:   Name: [name] DOB: 02-03-1956 MRN: [id number]   Airway Documentation:  Airway 8 mm (Active)  Secured at (cm) 21 cm 03/25/2014  8:48 AM  Measured From Lips 03/25/2014  8:48 AM  Secured Location Left 03/25/2014  8:48 AM  Secured By [employer] 03/25/2014  8:48 AM  Tube Holder Repositioned Yes 03/25/2014  8:48 AM  Cuff Pressure (cm H2O) 24 cm H2O 03/25/2014  8:48 AM  Site Condition Dry 03/25/2014  8:48 AM    Evaluation  O2 sats: stable throughout Complications: No apparent complications Patient did tolerate procedure well. Bilateral Breath Sounds: Rhonchi Suctioning: Airway Yes  Rigoberto Noel 03/25/2014, 11:03 AM

## 2014-03-25 NOTE — Progress Notes (Signed)
[address] [place], [place] [address] Phone: [phone] FAX: [place] [phone] 1955/10/30  CARE TEAM:  PCP: Rubbie Battiest, MD  Outpatient Care Team: Patient Care Team: [name], MD as PCP - General (Family Medicine) Satira Sark, MD as Consulting Physician (Cardiology) Danie Binder, MD as Consulting Physician (Gastroenterology) Milus Banister, MD as Attending Physician (Gastroenterology) Farrel Gobble, MD as Consulting Physician (Medical Oncology) Adin Hector, MD as Consulting Physician (General Surgery)  Inpatient Treatment Team: Treatment Team: Attending Provider: Adin Hector, MD; Registered Nurse: Kristopher Oppenheim, RN; Rounding Team: Md Pccm, MD; Registered Nurse: Heriberto Antigua, RN   Subjective:  NICU intubated.  Weaned to minimal vent settings.  Secretions have been minimal.  The patient sitting upright & writing notes.  Very alert.  Writing many questions.  Wife and friend at bedside.  Nurse Baker Janus at bedside.  Objective:  Vital signs:  Filed Vitals:   03/24/14 2000 03/24/14 2304 03/25/14 0410 03/25/14 0515  BP:  119/83 122/89   Pulse:  120 106   Temp: 98 F (36.7 C) 99 F (37.2 C)    TempSrc: Oral Oral    Resp: 33 42 45   Height:      Weight:    190 lb 0.6 oz (86.2 kg)  SpO2: 100% 100% 98%     Last BM Date: 03/21/14 (ileostomy)  Intake/Output   Yesterday:  08/04 0701 - 08/05 0700 In: 8357 [I.V.:5087; NG/GT:360; IV Piggyback:300] Out: 1751 [Urine:1500; Emesis/NG output:2700; Drains:445] This shift:     Bowel function:  Flatus: n  BM: n  Drain: serosanguinous  NGT: thick dark green  Physical Exam:  General: Pt awake/alert/oriented x4 in no acute distress.  Tired but not toxic. Eyes: PERRL, normal EOM.  Sclera clear.  No icterus Neuro: CN II-XII intact w/o focal sensory/motor deficits. Lymph: No head/neck/groin  lymphadenopathy Psych:  No delerium/psychosis/paranoia HENT: Normocephalic, Mucus membranes moist.  No thrush.  NGT & ETT in place Neck: Supple, No tracheal deviation Chest: No chest wall pain w good excursion CV:  Pulses intact.  Regular rhythm MS: Normal AROM mjr joints.  No obvious deformity Abdomen: Soft.  Mod distended.   Ileostomy & end colostomy edematous but viable.  Serous fluid in bag.  No evidence of peritonitis.  No incarcerated hernias.  Wound vac in place & clean Ext:  SCDs BLE.  No mjr edema.  No cyanosis Skin: No petechiae / purpura   Problem List:   Principal Problem:   Rectal cancer, uT2uN0, s/p LAR/coloanal/ileostomy 03/20/2014 Active Problems:   Tobacco abuse, in remission   GERD (gastroesophageal reflux disease)   Obesity (BMI 30-39.9)   ards   Aspiration pneumonia   Assessment  [name]  58 y.o. male  1 Day Post-Op    PROCEDURE 03/20/2014:  ROBOTIC ASSISTED low anterior resection  TRANSANAL TRANSABDOMINAL TME WITH HANDSEWN COLOANAL ANASTOMOSIS  splenic flexure mobilization  diverting loop ileostomy  PROCEDURE 03/24/2014:  FLEXIBLE SIGMOIDOSCOPY  Exploratory laparotomy  Takedown of coloanal anastomosis  Distal colectomy & end colostomy  Wound vac     Recovering relatively well only postop day one from laparotomy for ischemic/necrotic colon  Plan:  Continue ventilator support.  Agree with IV Zosyn for probable aspiration pneumonitis/PNA.  Secretions were minimal and his vent settings are minimal.  That is hopeful sign.  We will see  NGT  Ileostomy care.  Hopefully weaning pressors shock  resolves.  Hydration.  Follow electrolytes.  Stable K   PPI for GERD  Foley x3 days for possible retention & follow I&O  Pathology T2 N0.  Stage I..  Very high.  Just need to help get him through this stressful period  VTE prophylaxis- SCDs, etc  I updated the patient's status to the patient & family & ICU RN Baker Janus.  Recommendations were made.   Questions were answered.  The patient expressed understanding & appreciation.   Adin Hector, M.D., F.A.C.S. Gastrointestinal and Minimally Invasive Surgery [facility] [address] [place], [place] [address] [phone] Main / Paging   03/25/2014   Results:   Labs: Results for orders placed during the hospital encounter of 03/20/14 (from the past 48 hour(s))  URINALYSIS, ROUTINE W REFLEX MICROSCOPIC     Status: None   Collection Time    03/23/14  9:24 AM      Result Value Ref Range   Color, Urine YELLOW  YELLOW   APPearance CLEAR  CLEAR   Specific Gravity, Urine 1.029  1.005 - 1.030   pH 6.5  5.0 - 8.0   Glucose, UA NEGATIVE  NEGATIVE mg/dL   Hgb urine dipstick NEGATIVE  NEGATIVE   Bilirubin Urine NEGATIVE  NEGATIVE   Ketones, ur NEGATIVE  NEGATIVE mg/dL   Protein, ur NEGATIVE  NEGATIVE mg/dL   Urobilinogen, UA 0.2  0.0 - 1.0 mg/dL   Nitrite NEGATIVE  NEGATIVE   Leukocytes, UA NEGATIVE  NEGATIVE   Comment: MICROSCOPIC NOT DONE ON URINES WITH NEGATIVE PROTEIN, BLOOD, LEUKOCYTES, NITRITE, OR GLUCOSE <1000 mg/dL.  CBC     Status: Abnormal   Collection Time    03/24/14  5:40 AM      Result Value Ref Range   WBC 16.0 (*) 4.0 - 10.5 K/uL   RBC 3.62 (*) 4.22 - 5.81 MIL/uL   Hemoglobin 11.4 (*) 13.0 - 17.0 g/dL   HCT 33.3 (*) 39.0 - 52.0 %   MCV 92.0  78.0 - 100.0 fL   MCH 31.5  26.0 - 34.0 pg   MCHC 34.2  30.0 - 36.0 g/dL   RDW 13.5  11.5 - 15.5 %   Platelets 300  150 - 400 K/uL  POTASSIUM     Status: None   Collection Time    03/24/14  5:40 AM      Result Value Ref Range   Potassium 4.6  3.7 - 5.3 mEq/L  CREATININE, SERUM     Status: None   Collection Time    03/24/14  5:40 AM      Result Value Ref Range   Creatinine, Ser 0.66  0.50 - 1.35 mg/dL   GFR calc non Af Amer >90  >90 mL/min   GFR calc Af Amer >90  >90 mL/min   Comment: (NOTE)     The eGFR has been calculated using the CKD EPI equation.     This calculation has  not been validated in all clinical situations.     eGFR's persistently <90 mL/min signify possible Chronic Kidney     Disease.  TYPE AND SCREEN     Status: None   Collection Time    03/24/14 10:35 AM      Result Value Ref Range   ABO/RH(D) A NEG     Antibody Screen NEG     Sample Expiration 03/27/2014    MRSA PCR SCREENING     Status: None   Collection Time  03/24/14  5:00 PM      Result Value Ref Range   MRSA by PCR NEGATIVE  NEGATIVE   Comment:            The GeneXpert MRSA Assay (FDA     approved for NASAL specimens     only), is one component of a     comprehensive MRSA colonization     surveillance program. It is not     intended to diagnose MRSA     infection nor to guide or     monitor treatment for     MRSA infections.  BLOOD GAS, ARTERIAL     Status: Abnormal   Collection Time    03/24/14  5:45 PM      Result Value Ref Range   FIO2 0.70     Delivery systems VENTILATOR     Mode PRESSURE REGULATED VOLUME CONTROL     VT 500     Rate 20     Peep/cpap 12.0     pH, Arterial 7.164 (*) 7.350 - 7.450   Comment: CRITICAL RESULT CALLED TO, READ BACK BY AND VERIFIED WITH:     PAT MICKLEY,RN AT 1753 BY T.BURGESS,RRT,RCP ON 03/24/2014   pCO2 arterial 62.9 (*) 35.0 - 45.0 mmHg   Comment: CRITICAL RESULT CALLED TO, READ BACK BY AND VERIFIED WITH:     PAT MICKLEY,RN AT 1753 BY T.BURGESS,RRT,RCP ON 03/24/2014   pO2, Arterial 80.3  80.0 - 100.0 mmHg   Bicarbonate 21.7  20.0 - 24.0 mEq/L   TCO2 20.7  0 - 100 mmol/L   Acid-base deficit 7.3 (*) 0.0 - 2.0 mmol/L   O2 Saturation 90.9     Patient temperature 99.0     Collection site A-LINE     Drawn by [id number]     Sample type ARTERIAL    LACTIC ACID, PLASMA     Status: Abnormal   Collection Time    03/24/14  6:49 PM      Result Value Ref Range   Lactic Acid, Venous 2.5 (*) 0.5 - 2.2 mmol/L  BLOOD GAS, ARTERIAL     Status: Abnormal   Collection Time    03/24/14  8:05 PM      Result Value Ref Range   FIO2 0.70     Delivery  systems VENTILATOR     Mode PRESSURE REGULATED VOLUME CONTROL     VT 500     Rate 30.0     Peep/cpap 12.0     pH, Arterial 7.364  7.350 - 7.450   pCO2 arterial 39.0  35.0 - 45.0 mmHg   pO2, Arterial 176.0 (*) 80.0 - 100.0 mmHg   Bicarbonate 21.6  20.0 - 24.0 mEq/L   TCO2 19.8  0 - 100 mmol/L   Acid-base deficit 2.8 (*) 0.0 - 2.0 mmol/L   O2 Saturation 99.3     Patient temperature 99.1     Collection site A-LINE     Drawn by [id number]     Sample type ARTERIAL DRAW    BLOOD GAS, ARTERIAL     Status: Abnormal   Collection Time    03/24/14  9:31 PM      Result Value Ref Range   FIO2 0.50     Delivery systems VENTILATOR     Mode PRESSURE REGULATED VOLUME CONTROL     VT 500     Rate 30.0     Peep/cpap 10.0     pH, Arterial 7.372  7.350 - 7.450  pCO2 arterial 38.3  35.0 - 45.0 mmHg   pO2, Arterial 96.7  80.0 - 100.0 mmHg   Bicarbonate 21.8  20.0 - 24.0 mEq/L   TCO2 20.1  0 - 100 mmol/L   Acid-base deficit 2.7 (*) 0.0 - 2.0 mmol/L   O2 Saturation 97.5     Patient temperature 98.0     Collection site A-LINE     Drawn by [id number]     Sample type ARTERIAL DRAW    BLOOD GAS, ARTERIAL     Status: Abnormal   Collection Time    03/24/14 10:52 PM      Result Value Ref Range   FIO2 0.50     Delivery systems VENTILATOR     Mode PRESSURE REGULATED VOLUME CONTROL     VT 380     Rate 35.0     Peep/cpap 10.0     pH, Arterial 7.366  7.350 - 7.450   pCO2 arterial 39.7  35.0 - 45.0 mmHg   pO2, Arterial 170.0 (*) 80.0 - 100.0 mmHg   Bicarbonate 22.3  20.0 - 24.0 mEq/L   TCO2 20.6  0 - 100 mmol/L   Acid-base deficit 2.4 (*) 0.0 - 2.0 mmol/L   O2 Saturation 99.6     Patient temperature 98.0     Collection site A-LINE     Drawn by [id number]     Sample type ARTERIAL DRAW    BLOOD GAS, ARTERIAL     Status: None   Collection Time    03/25/14  3:56 AM      Result Value Ref Range   FIO2 0.40     Delivery systems VENTILATOR     Mode PRESSURE REGULATED VOLUME CONTROL     VT 380     Rate 35.0      Peep/cpap 8.0     pH, Arterial 7.364  7.350 - 7.450   pCO2 arterial 40.8  35.0 - 45.0 mmHg   pO2, Arterial 99.9  80.0 - 100.0 mmHg   Bicarbonate 22.6  20.0 - 24.0 mEq/L   TCO2 20.8  0 - 100 mmol/L   Acid-base deficit 2.0  0.0 - 2.0 mmol/L   O2 Saturation 97.4     Patient temperature 99.0     Collection site A-LINE     Drawn by [id number]     Sample type ARTERIAL DRAW    LACTIC ACID, PLASMA     Status: None   Collection Time    03/25/14  5:51 AM      Result Value Ref Range   Lactic Acid, Venous 2.1  0.5 - 2.2 mmol/L    Imaging / Studies: Dg Chest Port 1 View  03/25/2014   CLINICAL DATA:  Followup aspiration.  EXAM: PORTABLE CHEST - 1 VIEW  COMPARISON:  03/24/2014.  FINDINGS: Endotracheal tube ends just below the clavicular heads. Left upper extremity PICC tip at the superior cavoatrial junction. Orogastric tube remains in good position.  Normal heart size. Unchanged upper mediastinal contours. Interstitial coarsening, particularly perihilar, is mildly improved. There is suggestion of mild airspace nodularity in the left chest. No edema, effusion, or pneumothorax.  IMPRESSION: 1. Stable positioning of tubes and central line. 2. Mild improvement in perihilar aeration. Persistent interstitial coarsening correlates with history of aspiration.   Electronically Signed   By: Jorje Guild M.D.   On: 03/25/2014 06:19   Dg Chest Port 1 View  03/24/2014   CLINICAL DATA:  History of colorectal carcinoma, aspiration by history  EXAM:  PORTABLE CHEST - 1 VIEW  COMPARISON:  Chest x-ray of 12/29/2013  FINDINGS: There is airspace disease in the perihilar regions bilaterally which may represent pneumonia or edema. Followup chest x-ray is recommended. An endotracheal tube is present with the tip approximately 3.4 cm above the carina. Heart size is stable. NG tube coils in the fundus of the stomach.  IMPRESSION: 1. Bilateral perihilar airspace disease. Possible pneumonia but cannot exclude edema. Recommend  followup. 2. Tip of endotracheal tube approximately 3.4 cm above the carina P   Electronically Signed   By: Ivar Drape M.D.   On: 03/24/2014 17:13    Medications / Allergies: per chart  Antibiotics: Anti-infectives   Start     Dose/Rate Route Frequency Ordered Stop   03/25/14 0800  cefTRIAXone (ROCEPHIN) 2 g in dextrose 5 % 50 mL IVPB  Status:  Discontinued    Comments:  Pharmacy may adjust dosing strength / duration / interval for maximal efficacy   2 g 100 mL/hr over 30 Minutes Intravenous Every 24 hours 03/24/14 1655 03/24/14 2102   03/24/14 2200  piperacillin-tazobactam (ZOSYN) IVPB 3.375 g     3.375 g 12.5 mL/hr over 240 Minutes Intravenous 3 times per day 03/24/14 2133     03/24/14 2000  metroNIDAZOLE (FLAGYL) IVPB 500 mg     500 mg 100 mL/hr over 60 Minutes Intravenous Every 6 hours 03/24/14 1655 03/27/14 1959   03/24/14 0730  [MAR Hold]  cefoTEtan (CEFOTAN) 2 g in dextrose 5 % 50 mL IVPB     (On MAR Hold since 03/24/14 1125)  Comments:  Pharmacy may adjust dose strength for optimal dosing.   Send with patient on call to the OR.  Anesthesia to complete antibiotic administration <[facility]   2 g 100 mL/hr over 30 Minutes Intravenous On call to O.R. 03/24/14 0623708/04/15 1232   03/23/14 0830  cefTRIAXone (ROCEPHIN) 2 g in dextrose 5 % 50 mL IVPB  Status:  Discontinued    Comments:  Pharmacy may adjust dosing strength / duration / interval for maximal efficacy   2 g 100 mL/hr over 30 Minutes Intravenous Every 24 hours 03/23/14 0738 03/24/14 1655   03/23/14 0800  metroNIDAZOLE (FLAGYL) IVPB 500 mg  Status:  Discontinued     500 mg 100 mL/hr over 60 Minutes Intravenous Every 6 hours 03/23/14 0738 03/24/14 1655   03/20/14 2359  cefoTEtan (CEFOTAN) 2 g in dextrose 5 % 50 mL IVPB     2 g 100 mL/hr over 30 Minutes Intravenous Every 12 hours 03/20/14 1704 03/20/14 2345   03/20/14 1228  clindamycin (CLEOCIN) 900 mg, gentamicin (GARAMYCIN) 240 mg in  sodium chloride 0.9 % 1,000 mL for intraperitoneal lavage  Status:  Discontinued       As needed 03/20/14 1228 03/20/14 1329   03/20/14 0518  cefoTEtan (CEFOTAN) 2 g in dextrose 5 % 50 mL IVPB     2 g 100 mL/hr over 30 Minutes Intravenous On call to O.R. 03/20/14 0518 03/20/14 1259   03/20/14 0000  clindamycin (CLEOCIN) 900 mg, gentamicin (GARAMYCIN) 240 mg in sodium chloride 0.9 % 1,000 mL for intraperitoneal lavage  Status:  Discontinued    Comments:  Pharmacy may adjust dosing strength, schedule, rate of infusion, etc as needed to optimize therapy    Intraperitoneal To Surgery 03/19/14 1341 03/20/14 1453   03/19/14 1345  clindamycin (CLEOCIN) 900 mg, gentamicin (GARAMYCIN) 240 mg in sodium chloride 0.9 % 1,000 mL for intraperitoneal lavage  Status:  Discontinued    Comments:  Pharmacy may adjust dosing strength, schedule, rate of infusion, etc as needed to optimize therapy   1 application Intraperitoneal To Surgery 03/19/14 1339 03/19/14 1341       Note: Portions of this report may have been transcribed using voice recognition software. Every effort was made to ensure accuracy; however, inadvertent computerized transcription errors may be present.   Any transcriptional errors that result from this process are unintentional.

## 2014-03-25 NOTE — Telephone Encounter (Signed)
Added pt to GI tumor board for 04/08/14 per Dr Johney Maine.

## 2014-03-25 NOTE — Anesthesia Postprocedure Evaluation (Signed)
  Anesthesia Post-op Note  Patient: [name]  Procedure(s) Performed: Procedure(s) (LRB): ROBOTIC ASSISTED low anterior resection, splenic flexure mobilization diverting loop ileostomy, coloanal handsewn anastomosis (N/A)  Patient Location: PACU  Anesthesia Type: General  Level of Consciousness: awake and alert   Airway and Oxygen Therapy: Patient Spontanous Breathing  Post-op Pain: mild  Post-op Assessment: Post-op Vital signs reviewed, Patient's Cardiovascular Status Stable, Respiratory Function Stable, Patent Airway and No signs of Nausea or vomiting  Last Vitals:  Filed Vitals:   03/25/14 1200  BP:   Pulse:   Temp: 36.9 C  Resp: 20    Post-op Vital Signs: stable   Complications: No apparent anesthesia complications

## 2014-03-25 NOTE — Telephone Encounter (Signed)
Message copied by Illene Regulus on Wed Mar 25, 2014  2:48 PM ------      Message from: Adin Hector      Created: Tue Mar 24, 2014 12:27 PM       1.  Please set the patient up for GI Tumor Board.            Dx: [id number] recal cancer s/p LAR            Surgeon: Johney Maine      Gastroenterologist: Oretha Caprice, Barney Drain                         ------

## 2014-03-25 NOTE — Progress Notes (Signed)
210 ml of Fentanyl Infusion 2500 mcg/250 ns wasted in sink and witnessed by Artelia Laroche, RN

## 2014-03-25 NOTE — Progress Notes (Signed)
INITIAL NUTRITION ASSESSMENT  DOCUMENTATION CODES Per approved criteria  -Obesity Unspecified   INTERVENTION: - If pt unable to be extubated in the next 24-48 hours, recommend trickle TF of Vital High Protein at 37ml/hr - RD to continue to monitor   NUTRITION DIAGNOSIS: Inadequate oral intake related to inability to eat as evidenced by NPO.    Goal: Nutrition support initiation with goal to provide 60-70% of estimated calorie needs (22-25 kcals/kg ideal body weight) and 100% of estimated protein needs, based on ASPEN guidelines for permissive underfeeding in critically ill obese individuals  Monitor:  Weights, labs, TF initiation, vent status   Reason for Assessment: Ventilated pt   58 y.o. male  Admitting Dx: Rectal cancer  ASSESSMENT: Pt with very distal T2 rectal CA, admitted for resection with diverting loop ileostomy.  Had elevated heart rate night of 8/3. Ate 3 meals on 8/3 with good appetite per MD but had nausea/vomiting morning of 8/4. Was intubated yesterday afternoon and was noted to have a large amount of GI contents that emerged from pt's mouth and nose prior to intubation, with concern for aspiration. Had exploratory laparotomy, distal colectomy and end colostomy with placement of wound VAC yesterday. Was found to have ischemia of colonanal anastomosis. Per [facility] notes, concern is for development of ARDS.   - Pt awake on vent - Wife at bedside and reports pt with excellent appetite PTA, was eating 3 big meals/day with no changes in weight - Has NGT in place with 2.7L output total yesterday   Patient is currently intubated on ventilator support MV: 13 L/min Temp (24hrs), Avg:98.6 F (37 C), Min:98 F (36.7 C), Max:99.1 F (37.3 C)  Propofol: off     Height: Ht Readings from Last 1 Encounters:  03/23/14 5\' 5"  (1.651 m)    Weight: Wt Readings from Last 1 Encounters:  03/25/14 190 lb 0.6 oz (86.2 kg)    Ideal Body Weight: 136 lbs   % Ideal Body Weight:  140%  Wt Readings from Last 10 Encounters:  03/25/14 190 lb 0.6 oz (86.2 kg)  03/25/14 190 lb 0.6 oz (86.2 kg)  03/25/14 190 lb 0.6 oz (86.2 kg)  03/10/14 176 lb 12.8 oz (80.196 kg)  01/21/14 182 lb (82.555 kg)  01/15/14 179 lb (81.194 kg)  01/15/14 178 lb 8 oz (80.967 kg)  12/22/13 184 lb (83.462 kg)  12/22/13 184 lb (83.462 kg)  11/27/13 184 lb 9.6 oz (83.734 kg)    Usual Body Weight: 190 lbs  % Usual Body Weight: 100%  BMI:  Body mass index is 31.62 kg/(m^2).  Class I obesity   Estimated Nutritional Needs: Kcal: 1912 Protein: 124g Fluid: per MD  Skin: +1 generalized edema, abdominal incision   Diet Order: NPO  EDUCATION NEEDS: -No education needs identified at this time   Intake/Output Summary (Last 24 hours) at 03/25/14 0848 Last data filed at 03/25/14 0600  Gross per 24 hour  Intake   8357 ml  Output   7305 ml  Net   1052 ml    Last BM: 8/1 from ileostomy  Labs:   Recent Labs Lab 03/21/14 0613 03/23/14 0500 03/24/14 0540  NA 136* 133*  --   K 4.4 4.9 4.6  CL 100 98  --   CO2 27 28  --   BUN 20 31*  --   CREATININE 1.02 0.70 0.66  CALCIUM 8.6 8.1*  --   MG 2.0  --   --   GLUCOSE 124* 153*  --  CBG (last 3)  No results found for this basename: GLUCAP,  in the last 72 hours  Scheduled Meds: . antiseptic oral rinse  7 mL Mouth Rinse QID  . chlorhexidine  15 mL Mouth Rinse BID  . heparin subcutaneous  5,000 Units Subcutaneous 3 times per day  . lactated ringers  1,000 mL Intravenous Once  . lip balm  1 application Topical BID  . metronidazole  500 mg Intravenous Q6H  . pantoprazole (PROTONIX) IV  40 mg Intravenous Q12H  . piperacillin-tazobactam (ZOSYN)  IV  3.375 g Intravenous 3 times per day    Continuous Infusions: . sodium chloride    . fentaNYL infusion INTRAVENOUS 250 mcg/hr (03/25/14 0700)  . norepinephrine (LEVOPHED) Adult infusion 10 mcg/min (03/25/14 0753)  . vasopressin (PITRESSIN) infusion - *FOR SHOCK* 0.03 Units/min  (03/25/14 0600)    Past Medical History  Diagnosis Date  . GERD (gastroesophageal reflux disease)   . Shortness of breath     due to weight gain poststopping smokeing  . Arthritis     Past Surgical History  Procedure Laterality Date  . Colonoscopy N/A 12/22/2013    Procedure: COLONOSCOPY;  Surgeon: Danie Binder, MD;  Location: [facility];  Service: Endoscopy;  Laterality: N/A;  8:30  . Esophagogastroduodenoscopy N/A 12/22/2013    Procedure: ESOPHAGOGASTRODUODENOSCOPY (EGD);  Surgeon: Danie Binder, MD;  Location: [facility];  Service: Endoscopy;  Laterality: N/A;  . Eus N/A 01/01/2014    Procedure: LOWER ENDOSCOPIC ULTRASOUND (EUS);  Surgeon: Milus Banister, MD;  Location: [facility];  Service: Endoscopy;  Laterality: N/A;  . Hip fracture surgery Left age 78 on 04-06-1970  . Eye reattachment and ear reattachment  Right [address]    Carlis Stable MS, [place], [place] Pager [phone] Weekend/After Hours Pager

## 2014-03-26 ENCOUNTER — Telehealth (INDEPENDENT_AMBULATORY_CARE_PROVIDER_SITE_OTHER): Payer: Self-pay

## 2014-03-26 LAB — CBC
HCT: 22.7 % — ABNORMAL LOW (ref 39.0–52.0)
HEMOGLOBIN: 7.5 g/dL — AB (ref 13.0–17.0)
MCH: 30.9 pg (ref 26.0–34.0)
MCHC: 33 g/dL (ref 30.0–36.0)
MCV: 93.4 fL (ref 78.0–100.0)
Platelets: 254 10*3/uL (ref 150–400)
RBC: 2.43 MIL/uL — AB (ref 4.22–5.81)
RDW: 14.2 % (ref 11.5–15.5)
WBC: 23.8 10*3/uL — ABNORMAL HIGH (ref 4.0–10.5)

## 2014-03-26 LAB — BASIC METABOLIC PANEL
Anion gap: 9 (ref 5–15)
BUN: 27 mg/dL — ABNORMAL HIGH (ref 6–23)
CHLORIDE: 101 meq/L (ref 96–112)
CO2: 27 meq/L (ref 19–32)
CREATININE: 0.75 mg/dL (ref 0.50–1.35)
Calcium: 7.8 mg/dL — ABNORMAL LOW (ref 8.4–10.5)
GFR calc Af Amer: >90 mL/min (ref >90)
GFR calc non Af Amer: >90 mL/min (ref >90)
Glucose, Bld: 97 mg/dL (ref 70–99)
POTASSIUM: 4 meq/L (ref 3.7–5.3)
Sodium: 137 mEq/L (ref 137–147)

## 2014-03-26 MED ORDER — METHOCARBAMOL 1000 MG/10ML IJ SOLN: 500.0000 mg | Freq: Four times a day (QID) | INTRAVENOUS | Status: DC | PRN

## 2014-03-26 MED ORDER — FUROSEMIDE 10 MG/ML IJ SOLN
20.0000 mg | Freq: Once | INTRAMUSCULAR | Status: AC
  Administered 2014-03-26: 20 mg via INTRAVENOUS
  Filled 2014-03-26: qty 2

## 2014-03-26 MED ORDER — SODIUM CHLORIDE 0.9 % IV SOLN
100.0000 mg | Freq: Every day | INTRAVENOUS | Status: DC
  Administered 2014-03-27 – 2014-03-29 (×4): 100 mg via INTRAVENOUS
  Filled 2014-03-26 (×5): qty 100

## 2014-03-26 MED ORDER — LACTATED RINGERS IV BOLUS (SEPSIS): 1000.0000 mL | Freq: Three times a day (TID) | INTRAVENOUS | Status: AC | PRN

## 2014-03-26 MED ORDER — FENTANYL CITRATE 0.05 MG/ML IJ SOLN
50.0000 ug | INTRAMUSCULAR | Status: DC | PRN
  Administered 2014-03-26: 50 ug via INTRAVENOUS
  Administered 2014-03-27 (×4): 100 ug via INTRAVENOUS
  Administered 2014-03-27: 50 ug via INTRAVENOUS
  Administered 2014-03-27 – 2014-03-29 (×8): 100 ug via INTRAVENOUS
  Administered 2014-03-29 – 2014-03-30 (×3): 50 ug via INTRAVENOUS
  Filled 2014-03-26 (×18): qty 2

## 2014-03-26 MED ORDER — ACETAMINOPHEN 500 MG PO TABS
1000.0000 mg | ORAL_TABLET | Freq: Three times a day (TID) | ORAL | Status: DC
  Administered 2014-03-26: 1000 mg via ORAL
  Filled 2014-03-26: qty 2

## 2014-03-26 MED ORDER — FENTANYL CITRATE 0.05 MG/ML IJ SOLN
50.0000 ug | INTRAMUSCULAR | Status: DC | PRN
  Administered 2014-03-26: 100 ug via INTRAVENOUS
  Administered 2014-03-26: 50 ug via INTRAVENOUS
  Administered 2014-03-26 (×2): 100 ug via INTRAVENOUS
  Filled 2014-03-26 (×4): qty 2

## 2014-03-26 MED ORDER — ACETAMINOPHEN 160 MG/5ML PO SOLN
1000.0000 mg | Freq: Three times a day (TID) | ORAL | Status: DC
  Administered 2014-03-26 – 2014-03-28 (×8): 1000 mg via ORAL
  Filled 2014-03-26 (×10): qty 40.6

## 2014-03-26 NOTE — Progress Notes (Signed)
NUTRITION FOLLOW UP  Intervention:   - Diet advancement per MD - RD to continue to monitor   NUTRITION DIAGNOSIS:  Inadequate oral intake related to inability to eat as evidenced by NPO - ongoing   Goal:  Nutrition support initiation with goal to provide 60-70% of estimated calorie needs (22-25 kcals/kg ideal body weight) and 100% of estimated protein needs, based on ASPEN guidelines for permissive underfeeding in critically ill obese individuals - not met, not on nutrition support  New goal: Advance diet as tolerated to soft diet   Monitor:   Weights, labs, diet advancement, colostomy output   Assessment:   Pt with very distal T2 rectal CA, admitted for resection with diverting loop ileostomy. Had elevated heart rate night of 8/3. Ate 3 meals on 8/3 with good appetite per MD but had nausea/vomiting morning of 8/4. Was intubated yesterday afternoon and was noted to have a large amount of GI contents that emerged from pt's mouth and nose prior to intubation, with concern for aspiration. Had exploratory laparotomy, distal colectomy and end colostomy with placement of wound VAC yesterday. Was found to have ischemia of colonanal anastomosis. Per [facility] notes, concern is for development of ARDS.   8/5: - Pt awake on vent  - Wife at bedside and reports pt with excellent appetite PTA, was eating 3 big meals/day with no changes in weight  - Has NGT in place with 2.7L output total yesterday   8/6: - Extubated yesterday, remains NPO - Has NGT clamped - Met with pt who denies any nausea, eager to have [employer] popsicle  - Said he was eating "like a horse" PTA - Pt alone in room  - Pt with 276m output from ostomy so far today  Height: Ht Readings from Last 1 Encounters:  03/23/14 '5\' 5"'  (1.651 m)    Weight Status:   Wt Readings from Last 1 Encounters:  03/26/14 190 lb 0.6 oz (86.2 kg)  Admit wt         176 lb (79.8 kg) Net I/Os: +6.2L  Re-estimated needs:  Kcal: 1600-1800 Protein:  70-90 Fluid: per MD  Skin: Generalized edema, Colostomy   Diet Order: NPO   Intake/Output Summary (Last 24 hours) at 03/26/14 1340 Last data filed at 03/26/14 1300  Gross per 24 hour  Intake 2986.2 ml  Output   1865 ml  Net 1121.2 ml    Last BM: 8/1   Labs:   Recent Labs Lab 03/21/14 0613 03/23/14 0500 03/24/14 0540 03/25/14 1031 03/26/14 0600  NA 136* 133*  --  135* 137  K 4.4 4.9 4.6 5.1 4.0  CL 100 98  --  101 101  CO2 27 28  --  23 27  BUN 20 31*  --  31* 27*  CREATININE 1.02 0.70 0.66 0.77 0.75  CALCIUM 8.6 8.1*  --  7.8* 7.8*  MG 2.0  --   --   --   --   GLUCOSE 124* 153*  --  147* 97    CBG (last 3)  No results found for this basename: GLUCAP,  in the last 72 hours  Scheduled Meds: . acetaminophen  1,000 mg Oral TID  . antiseptic oral rinse  7 mL Mouth Rinse QID  . chlorhexidine  15 mL Mouth Rinse BID  . heparin subcutaneous  5,000 Units Subcutaneous 3 times per day  . lactated ringers  1,000 mL Intravenous Once  . lip balm  1 application Topical BID  . metronidazole  500  mg Intravenous Q6H  . pantoprazole (PROTONIX) IV  40 mg Intravenous Q12H  . piperacillin-tazobactam (ZOSYN)  IV  3.375 g Intravenous 3 times per day    Continuous Infusions: . sodium chloride 10 mL/hr at 03/26/14 0823  . fentaNYL infusion INTRAVENOUS 250 mcg/hr (03/25/14 0700)     Carlis Stable MS, RD, LDN [phone] Pager [phone] Weekend/After Hours Pager

## 2014-03-26 NOTE — Consult Note (Signed)
WOC ostomy follow up Patient is seen today in the ICU after extubation and removal of arterial line.  Noted that patient has an open abdominal wound with NPWT in place.  Per Dr. Johney Maine, changing of the abdominal V.A.C. should take place tomorrow (Friday). I am not here tomorrow, so one of my partners will see for this V.A.C. Change and will also change and measure the new stoma (colostomy).  The ileostomy stoma pouching system will also be changed. I will return on Monday and see patient then. Enrolled patient in [place] Start Discharge program: Yes-for original ostomy (the loop ileostomy). Not established for new colostomy. [facility] nursing team will ollow, and will remain available to this patient, his family, the nursing, surgical and medical teams for continued support and teaching. Thanks, Maudie Flakes, MSN, RN, [place], [place], [place] [phone])

## 2014-03-26 NOTE — Progress Notes (Signed)
Pt pain controlled well with prn Fentanyl. Pt tolerating sips of liquid and ice chips- no nausea.

## 2014-03-26 NOTE — Telephone Encounter (Signed)
Mariah RN at [facility] ICU called to let Dr Johney Maine know pts hgb is 7.5 today which is down from yesterdays of 9.8. She can be reached at [phone] if you need to talk to her.

## 2014-03-26 NOTE — Progress Notes (Signed)
[address] [place], [place] [address] Phone: [phone] FAX: [place] [phone] 1956-01-31  CARE TEAM:  PCP: Rubbie Battiest, MD  Outpatient Care Team: Patient Care Team: [name], MD as PCP - General (Family Medicine) Satira Sark, MD as Consulting Physician (Cardiology) Danie Binder, MD as Consulting Physician (Gastroenterology) Milus Banister, MD as Attending Physician (Gastroenterology) Farrel Gobble, MD as Consulting Physician (Medical Oncology) Adin Hector, MD as Consulting Physician (General Surgery)  Inpatient Treatment Team: Treatment Team: Attending Provider: Adin Hector, MD; Registered Nurse: Kristopher Oppenheim, RN; Rounding Team: Md Pccm, MD; Dietitian: Toribio Harbour, RD   Subjective:  Extubated yesterday.  On nasal cannula.  On ice chips.  Wife and ICU nurse in room.  Weaned off pressors.  Pain somewhat controlled.  Objective:  Vital signs:  Filed Vitals:   03/25/14 2000 03/25/14 2353 03/26/14 0400 03/26/14 0500  BP:      Pulse:      Temp: 99.6 F (37.6 C) 98.4 F (36.9 C) 98.5 F (36.9 C)   TempSrc: Oral Oral Oral   Resp:      Height:      Weight:    190 lb 0.6 oz (86.2 kg)  SpO2:        Last BM Date: 03/21/14 (ileostomy)  Intake/Output   Yesterday:  08/05 0701 - 08/06 0700 In: 3381.5 [I.V.:1591.5; NG/GT:360; IV Piggyback:550] Out: 1594 [Urine:1225; Emesis/NG output:250; Drains:100] This shift:     Bowel function:  Flatus: yes   BM: thin effluent in ileostomy bag  Drain: serosanguinous  NGT: thick dark green  Physical Exam:  General: Pt awake/alert/oriented x4 in no acute distress.  Tired but not toxic. Eyes: PERRL, normal EOM.  Sclera clear.  No icterus Neuro: CN II-XII intact w/o focal sensory/motor deficits. Lymph: No head/neck/groin lymphadenopathy Psych:  No delerium/psychosis/paranoia HENT:  Normocephalic, Mucus membranes moist.  No thrush.  NGT in place Neck: Supple, No tracheal deviation Chest: No chest wall pain w good excursion CV:  Pulses intact.  Regular rhythm MS: Normal AROM mjr joints.  No obvious deformity Abdomen: Soft.  Mod distended.   Ileostomy & end colostomy edematous but viable.  Serous fluid in bag.  No evidence of peritonitis.  No incarcerated hernias.  Wound vac in place & clean Ext:  SCDs BLE.  No mjr edema.  No cyanosis Skin: No petechiae / purpura   Problem List:   Principal Problem:   Rectal cancer, uT2uN0, s/p LAR/coloanal/ileostomy 03/20/2014 Active Problems:   Tobacco abuse, in remission   GERD (gastroesophageal reflux disease)   Obesity (BMI 30-39.9)   ards   Aspiration pneumonia   Assessment  [name]  58 y.o. male  2 Days Post-Op    PROCEDURE 03/20/2014:  ROBOTIC ASSISTED low anterior resection  TRANSANAL TRANSABDOMINAL TME WITH HANDSEWN COLOANAL ANASTOMOSIS  splenic flexure mobilization  diverting loop ileostomy  PROCEDURE 03/24/2014:  FLEXIBLE SIGMOIDOSCOPY  Exploratory laparotomy  Takedown of coloanal anastomosis  Distal colectomy & end colostomy  Wound vac     Recovering relatively well only postop day 2 from laparotomy for ischemic/necrotic colon  Plan:  Agree with IV Zosyn for probable aspiration pneumonitis/PNA.  Secretions were minimal and He has tolerated extubation for the past 24 hours.  That is hopeful sign.  We will see  NGT clamping trial  Ileostomy care.  Diuresis.  Follow electrolytes.  Stable K   PPI for  GERD  Patient apparently leaking around the Foley with Lasix.  Will remove and I when necessary.  Pathology T2 N0.  Stage I. Cure rate very high.  Just need to help get him through this stressful period  VTE prophylaxis- SCDs, etc  I updated the patient's status to the patient & wife & ICU RN.  Recommendations were made.  Questions were answered.  The patient expressed understanding &  appreciation.   Adin Hector, M.D., F.A.C.S. Gastrointestinal and Minimally Invasive Surgery [facility] [address], [place], [place] [address] [phone] Main / Paging   03/26/2014   Results:   Labs: Results for orders placed during the hospital encounter of 03/20/14 (from the past 48 hour(s))  TYPE AND SCREEN     Status: None   Collection Time    03/24/14 10:35 AM      Result Value Ref Range   ABO/RH(D) A NEG     Antibody Screen NEG     Sample Expiration 03/27/2014    MRSA PCR SCREENING     Status: None   Collection Time    03/24/14  5:00 PM      Result Value Ref Range   MRSA by PCR NEGATIVE  NEGATIVE   Comment:            The GeneXpert MRSA Assay (FDA     approved for NASAL specimens     only), is one component of a     comprehensive MRSA colonization     surveillance program. It is not     intended to diagnose MRSA     infection nor to guide or     monitor treatment for     MRSA infections.  BLOOD GAS, ARTERIAL     Status: Abnormal   Collection Time    03/24/14  5:45 PM      Result Value Ref Range   FIO2 0.70     Delivery systems VENTILATOR     Mode PRESSURE REGULATED VOLUME CONTROL     VT 500     Rate 20     Peep/cpap 12.0     pH, Arterial 7.164 (*) 7.350 - 7.450   Comment: CRITICAL RESULT CALLED TO, READ BACK BY AND VERIFIED WITH:     PAT MICKLEY,RN AT 1753 BY T.BURGESS,RRT,RCP ON 03/24/2014   pCO2 arterial 62.9 (*) 35.0 - 45.0 mmHg   Comment: CRITICAL RESULT CALLED TO, READ BACK BY AND VERIFIED WITH:     PAT MICKLEY,RN AT 1753 BY T.BURGESS,RRT,RCP ON 03/24/2014   pO2, Arterial 80.3  80.0 - 100.0 mmHg   Bicarbonate 21.7  20.0 - 24.0 mEq/L   TCO2 20.7  0 - 100 mmol/L   Acid-base deficit 7.3 (*) 0.0 - 2.0 mmol/L   O2 Saturation 90.9     Patient temperature 99.0     Collection site A-LINE     Drawn by [id number]     Sample type ARTERIAL    LACTIC ACID, PLASMA     Status: Abnormal   Collection Time    03/24/14   6:49 PM      Result Value Ref Range   Lactic Acid, Venous 2.5 (*) 0.5 - 2.2 mmol/L  BLOOD GAS, ARTERIAL     Status: Abnormal   Collection Time    03/24/14  8:05 PM      Result Value Ref Range   FIO2 0.70     Delivery systems VENTILATOR     Mode PRESSURE  REGULATED VOLUME CONTROL     VT 500     Rate 30.0     Peep/cpap 12.0     pH, Arterial 7.364  7.350 - 7.450   pCO2 arterial 39.0  35.0 - 45.0 mmHg   pO2, Arterial 176.0 (*) 80.0 - 100.0 mmHg   Bicarbonate 21.6  20.0 - 24.0 mEq/L   TCO2 19.8  0 - 100 mmol/L   Acid-base deficit 2.8 (*) 0.0 - 2.0 mmol/L   O2 Saturation 99.3     Patient temperature 99.1     Collection site A-LINE     Drawn by [id number]     Sample type ARTERIAL DRAW    BLOOD GAS, ARTERIAL     Status: Abnormal   Collection Time    03/24/14  9:31 PM      Result Value Ref Range   FIO2 0.50     Delivery systems VENTILATOR     Mode PRESSURE REGULATED VOLUME CONTROL     VT 500     Rate 30.0     Peep/cpap 10.0     pH, Arterial 7.372  7.350 - 7.450   pCO2 arterial 38.3  35.0 - 45.0 mmHg   pO2, Arterial 96.7  80.0 - 100.0 mmHg   Bicarbonate 21.8  20.0 - 24.0 mEq/L   TCO2 20.1  0 - 100 mmol/L   Acid-base deficit 2.7 (*) 0.0 - 2.0 mmol/L   O2 Saturation 97.5     Patient temperature 98.0     Collection site A-LINE     Drawn by [id number]     Sample type ARTERIAL DRAW    BLOOD GAS, ARTERIAL     Status: Abnormal   Collection Time    03/24/14 10:52 PM      Result Value Ref Range   FIO2 0.50     Delivery systems VENTILATOR     Mode PRESSURE REGULATED VOLUME CONTROL     VT 380     Rate 35.0     Peep/cpap 10.0     pH, Arterial 7.366  7.350 - 7.450   pCO2 arterial 39.7  35.0 - 45.0 mmHg   pO2, Arterial 170.0 (*) 80.0 - 100.0 mmHg   Bicarbonate 22.3  20.0 - 24.0 mEq/L   TCO2 20.6  0 - 100 mmol/L   Acid-base deficit 2.4 (*) 0.0 - 2.0 mmol/L   O2 Saturation 99.6     Patient temperature 98.0     Collection site A-LINE     Drawn by [id number]     Sample type ARTERIAL DRAW     BLOOD GAS, ARTERIAL     Status: None   Collection Time    03/25/14  3:56 AM      Result Value Ref Range   FIO2 0.40     Delivery systems VENTILATOR     Mode PRESSURE REGULATED VOLUME CONTROL     VT 380     Rate 35.0     Peep/cpap 8.0     pH, Arterial 7.364  7.350 - 7.450   pCO2 arterial 40.8  35.0 - 45.0 mmHg   pO2, Arterial 99.9  80.0 - 100.0 mmHg   Bicarbonate 22.6  20.0 - 24.0 mEq/L   TCO2 20.8  0 - 100 mmol/L   Acid-base deficit 2.0  0.0 - 2.0 mmol/L   O2 Saturation 97.4     Patient temperature 99.0     Collection site A-LINE     Drawn by [id number]     Sample  type ARTERIAL DRAW    LACTIC ACID, PLASMA     Status: None   Collection Time    03/25/14  5:51 AM      Result Value Ref Range   Lactic Acid, Venous 2.1  0.5 - 2.2 mmol/L  CBC     Status: Abnormal   Collection Time    03/25/14 10:31 AM      Result Value Ref Range   WBC 21.8 (*) 4.0 - 10.5 K/uL   RBC 3.12 (*) 4.22 - 5.81 MIL/uL   Hemoglobin 9.8 (*) 13.0 - 17.0 g/dL   HCT 28.7 (*) 39.0 - 52.0 %   MCV 92.0  78.0 - 100.0 fL   MCH 31.4  26.0 - 34.0 pg   MCHC 34.1  30.0 - 36.0 g/dL   RDW 13.8  11.5 - 15.5 %   Platelets 245  150 - 400 K/uL  BASIC METABOLIC PANEL     Status: Abnormal   Collection Time    03/25/14 10:31 AM      Result Value Ref Range   Sodium 135 (*) 137 - 147 mEq/L   Potassium 5.1  3.7 - 5.3 mEq/L   Chloride 101  96 - 112 mEq/L   CO2 23  19 - 32 mEq/L   Glucose, Bld 147 (*) 70 - 99 mg/dL   BUN 31 (*) 6 - 23 mg/dL   Creatinine, Ser 0.77  0.50 - 1.35 mg/dL   Calcium 7.8 (*) 8.4 - 10.5 mg/dL   GFR calc non Af Amer >90  >90 mL/min   GFR calc Af Amer >90  >90 mL/min   Comment: (NOTE)     The eGFR has been calculated using the CKD EPI equation.     This calculation has not been validated in all clinical situations.     eGFR's persistently <90 mL/min signify possible Chronic Kidney     Disease.   Anion gap 11  5 - 15  CBC     Status: Abnormal   Collection Time    03/26/14  6:00 AM      Result  Value Ref Range   WBC 23.8 (*) 4.0 - 10.5 K/uL   RBC 2.43 (*) 4.22 - 5.81 MIL/uL   Hemoglobin 7.5 (*) 13.0 - 17.0 g/dL   Comment: DELTA CHECK NOTED     REPEATED TO VERIFY   HCT 22.7 (*) 39.0 - 52.0 %   MCV 93.4  78.0 - 100.0 fL   MCH 30.9  26.0 - 34.0 pg   MCHC 33.0  30.0 - 36.0 g/dL   RDW 14.2  11.5 - 15.5 %   Platelets 254  150 - 400 K/uL  BASIC METABOLIC PANEL     Status: Abnormal   Collection Time    03/26/14  6:00 AM      Result Value Ref Range   Sodium 137  137 - 147 mEq/L   Potassium 4.0  3.7 - 5.3 mEq/L   Comment: DELTA CHECK NOTED     REPEATED TO VERIFY   Chloride 101  96 - 112 mEq/L   CO2 27  19 - 32 mEq/L   Glucose, Bld 97  70 - 99 mg/dL   BUN 27 (*) 6 - 23 mg/dL   Creatinine, Ser 0.75  0.50 - 1.35 mg/dL   Calcium 7.8 (*) 8.4 - 10.5 mg/dL   GFR calc non Af Amer >90  >90 mL/min   GFR calc Af Amer >90  >90 mL/min   Comment: (  NOTE)     The eGFR has been calculated using the CKD EPI equation.     This calculation has not been validated in all clinical situations.     eGFR's persistently <90 mL/min signify possible Chronic Kidney     Disease.   Anion gap 9  5 - 15    Imaging / Studies: Dg Chest Port 1 View  03/25/2014   CLINICAL DATA:  Followup aspiration.  EXAM: PORTABLE CHEST - 1 VIEW  COMPARISON:  03/24/2014.  FINDINGS: Endotracheal tube ends just below the clavicular heads. Left upper extremity PICC tip at the superior cavoatrial junction. Orogastric tube remains in good position.  Normal heart size. Unchanged upper mediastinal contours. Interstitial coarsening, particularly perihilar, is mildly improved. There is suggestion of mild airspace nodularity in the left chest. No edema, effusion, or pneumothorax.  IMPRESSION: 1. Stable positioning of tubes and central line. 2. Mild improvement in perihilar aeration. Persistent interstitial coarsening correlates with history of aspiration.   Electronically Signed   By: Jorje Guild M.D.   On: 03/25/2014 06:19   Dg Chest  Port 1 View  03/24/2014   CLINICAL DATA:  History of colorectal carcinoma, aspiration by history  EXAM: PORTABLE CHEST - 1 VIEW  COMPARISON:  Chest x-ray of 12/29/2013  FINDINGS: There is airspace disease in the perihilar regions bilaterally which may represent pneumonia or edema. Followup chest x-ray is recommended. An endotracheal tube is present with the tip approximately 3.4 cm above the carina. Heart size is stable. NG tube coils in the fundus of the stomach.  IMPRESSION: 1. Bilateral perihilar airspace disease. Possible pneumonia but cannot exclude edema. Recommend followup. 2. Tip of endotracheal tube approximately 3.4 cm above the carina P   Electronically Signed   By: Ivar Drape M.D.   On: 03/24/2014 17:13    Medications / Allergies: per chart  Antibiotics: Anti-infectives   Start     Dose/Rate Route Frequency Ordered Stop   03/25/14 0800  cefTRIAXone (ROCEPHIN) 2 g in dextrose 5 % 50 mL IVPB  Status:  Discontinued    Comments:  Pharmacy may adjust dosing strength / duration / interval for maximal efficacy   2 g 100 mL/hr over 30 Minutes Intravenous Every 24 hours 03/24/14 1655 03/24/14 2102   03/24/14 2200  piperacillin-tazobactam (ZOSYN) IVPB 3.375 g     3.375 g 12.5 mL/hr over 240 Minutes Intravenous 3 times per day 03/24/14 2133     03/24/14 2000  metroNIDAZOLE (FLAGYL) IVPB 500 mg     500 mg 100 mL/hr over 60 Minutes Intravenous Every 6 hours 03/24/14 1655 03/27/14 1959   03/24/14 0730  [MAR Hold]  cefoTEtan (CEFOTAN) 2 g in dextrose 5 % 50 mL IVPB     (On MAR Hold since 03/24/14 1125)  Comments:  Pharmacy may adjust dose strength for optimal dosing.   Send with patient on call to the OR.  Anesthesia to complete antibiotic administration <38mn [facility]   2 g 100 mL/hr over 30 Minutes Intravenous On call to O.R. 03/24/14 0728 03/24/14 1232   03/23/14 0830  cefTRIAXone (ROCEPHIN) 2 g in dextrose 5 % 50 mL IVPB  Status:  Discontinued    Comments:   Pharmacy may adjust dosing strength / duration / interval for maximal efficacy   2 g 100 mL/hr over 30 Minutes Intravenous Every 24 hours 03/23/14 0738 03/24/14 1655   03/23/14 0800  metroNIDAZOLE (FLAGYL) IVPB 500 mg  Status:  Discontinued  500 mg 100 mL/hr over 60 Minutes Intravenous Every 6 hours 03/23/14 0738 03/24/14 1655   03/20/14 2359  cefoTEtan (CEFOTAN) 2 g in dextrose 5 % 50 mL IVPB     2 g 100 mL/hr over 30 Minutes Intravenous Every 12 hours 03/20/14 1704 03/20/14 2345   03/20/14 1228  clindamycin (CLEOCIN) 900 mg, gentamicin (GARAMYCIN) 240 mg in sodium chloride 0.9 % 1,000 mL for intraperitoneal lavage  Status:  Discontinued       As needed 03/20/14 1228 03/20/14 1329   03/20/14 0518  cefoTEtan (CEFOTAN) 2 g in dextrose 5 % 50 mL IVPB     2 g 100 mL/hr over 30 Minutes Intravenous On call to O.R. 03/20/14 0518 03/20/14 1259   03/20/14 0000  clindamycin (CLEOCIN) 900 mg, gentamicin (GARAMYCIN) 240 mg in sodium chloride 0.9 % 1,000 mL for intraperitoneal lavage  Status:  Discontinued    Comments:  Pharmacy may adjust dosing strength, schedule, rate of infusion, etc as needed to optimize therapy    Intraperitoneal To Surgery 03/19/14 1341 03/20/14 1453   03/19/14 1345  clindamycin (CLEOCIN) 900 mg, gentamicin (GARAMYCIN) 240 mg in sodium chloride 0.9 % 1,000 mL for intraperitoneal lavage  Status:  Discontinued    Comments:  Pharmacy may adjust dosing strength, schedule, rate of infusion, etc as needed to optimize therapy   1 application Intraperitoneal To Surgery 03/19/14 1339 03/19/14 1341       Note: Portions of this report may have been transcribed using voice recognition software. Every effort was made to ensure accuracy; however, inadvertent computerized transcription errors may be present.   Any transcriptional errors that result from this process are unintentional.

## 2014-03-26 NOTE — Progress Notes (Addendum)
PULMONARY / CRITICAL CARE MEDICINE   Name: [name] MRN: [id number] DOB: [birth year]    ADMISSION DATE:  03/20/2014 CONSULTATION DATE:  03/24/2014  REFERRING MD :  Michael Boston  CHIEF COMPLAINT:  Abdominal pain  INITIAL PRESENTATION:  58 yo male former smoker with rectal bleeding found to have rectal cancer.  He had robotic assisted low anterior resection and diverting loop ileostomy on 03/20/14.  He developed ileus, tachycardia, emesis 8/03.  He was taken to OR for re-assessment and found to have ischemia of colonanal anastomosis.  He developed emesis and aspiration during anesthesia induction.  He returned to ICU on vent and hypotensive.  PCCM consulted to assist with management.  STUDIES:   SIGNIFICANT EVENTS: 7/31 Low anterior resection and diverting loop ileostomy 8/03 tachycardia, emesis, ileus 8/04 To OR > distal colectomy and end colostomy with wound vac, emesis/aspiration during anesthesia induction   HISTORY OF PRESENT ILLNESS:   58 yo male former smoker with rectal bleeding found to have rectal cancer.  He had robotic assisted low anterior resection and diverting loop ileostomy on 03/20/14.  He developed ileus, tachycardia, emesis 8/03.  He was taken to OR for re-assessment and found to have ischemia of colonanal anastomosis.  He developed emesis and aspiration during anesthesia induction.  He returned to ICU on vent and hypotensive.  PCCM consulted to assist with management.  SUBJECTIVE:  Extubated, stable C/o pain w urination Thirsty   VITAL SIGNS: Temp:  [98 F (36.7 C)-99.6 F (37.6 C)] 98.5 F (36.9 C) (08/06 0400) Resp:  [18-27] 27 (08/06 0800) BP: (130)/(70) 130/70 mmHg (08/05 1100) SpO2:  [85 %-100 %] 97 % (08/06 0800) FiO2 (%):  [40 %] 40 % (08/05 1000) Weight:  [86.2 kg (190 lb 0.6 oz)] 86.2 kg (190 lb 0.6 oz) (08/06 0500) HEMODYNAMICS:   VENTILATOR SETTINGS: Vent Mode:  [-]  FiO2 (%):  [40 %] 40 % INTAKE / OUTPUT:  Intake/Output Summary (Last  24 hours) at 03/26/14 0902 Last data filed at 03/26/14 0826  Gross per 24 hour  Intake   3290 ml  Output   1475 ml  Net   1815 ml    PHYSICAL EXAMINATION: General: sitting up, comfortable Neuro:  Awake and interacting, follows commands HEENT:  NG tube in place Cardiovascular:  Regular, tachycardic Lungs:  B/l rales Abdomen:  Wound vac, ileostomy, colostomy in place Musculoskeletal:  No edema Skin:  No rashes  LABS:  CBC  Recent Labs Lab 03/24/14 0540 03/25/14 1031 03/26/14 0600  WBC 16.0* 21.8* 23.8*  HGB 11.4* 9.8* 7.5*  HCT 33.3* 28.7* 22.7*  PLT 300 245 254   Coag's No results found for this basename: APTT, INR,  in the last 168 hours  BMET  Recent Labs Lab 03/23/14 0500 03/24/14 0540 03/25/14 1031 03/26/14 0600  NA 133*  --  135* 137  K 4.9 4.6 5.1 4.0  CL 98  --  101 101  CO2 28  --  23 27  BUN 31*  --  31* 27*  CREATININE 0.70 0.66 0.77 0.75  GLUCOSE 153*  --  147* 97   Electrolytes  Recent Labs Lab 03/21/14 0613 03/23/14 0500 03/25/14 1031 03/26/14 0600  CALCIUM 8.6 8.1* 7.8* 7.8*  MG 2.0  --   --   --    Sepsis Markers  Recent Labs Lab 03/24/14 1849 03/25/14 0551  LATICACIDVEN 2.5* 2.1    ABG  Recent Labs Lab 03/24/14 2131 03/24/14 2252 03/25/14 0356  PHART 7.372 7.366 7.364  PCO2ART 38.3 39.7 40.8  PO2ART 96.7 170.0* 99.9    Liver Enzymes No results found for this basename: AST, ALT, ALKPHOS, BILITOT, ALBUMIN,  in the last 168 hours  Cardiac Enzymes No results found for this basename: TROPONINI, PROBNP,  in the last 168 hours  Glucose No results found for this basename: GLUCAP,  in the last 168 hours  Imaging Dg Chest Port 1 View  03/25/2014   CLINICAL DATA:  Followup aspiration.  EXAM: PORTABLE CHEST - 1 VIEW  COMPARISON:  03/24/2014.  FINDINGS: Endotracheal tube ends just below the clavicular heads. Left upper extremity PICC tip at the superior cavoatrial junction. Orogastric tube remains in good position.  Normal  heart size. Unchanged upper mediastinal contours. Interstitial coarsening, particularly perihilar, is mildly improved. There is suggestion of mild airspace nodularity in the left chest. No edema, effusion, or pneumothorax.  IMPRESSION: 1. Stable positioning of tubes and central line. 2. Mild improvement in perihilar aeration. Persistent interstitial coarsening correlates with history of aspiration.   Electronically Signed   By: Jorje Guild M.D.   On: 03/25/2014 06:19     ASSESSMENT / PLAN:  PULMONARY OETT 8/04 >> 8/5 A: Acute respiratory failure Aspiration event with presumed pneumonitis and high risk pneumonia P:   Bronchial hygiene Empiric abx as below F/u CXR on 8/7  CARDIOVASCULAR Lt PICC 8/01 >> Rt radial aline 8/04 >> 8/6 A:  Hypovolemia Shock, improving and pressors off am 8/5 P:  Continue IV fluids until able to take nutrition  RENAL A:   Oliguria >> likely from hypovolemia. Improving P:   ? D/c foley 8/6, will ask dr Johney Maine Monitor renal fx, urine outpt, electrolytes  GASTROINTESTINAL A:   Rectal cancer s/p resection complicated by anastomosis ischemia. P:   Post-op care, nutrition per [facility] Protonix BID  HEMATOLOGIC A:   Leukocystosis. P:  F/u CBC SQ heparin for DVT prevention  INFECTIOUS A:   Sepsis with concern for peritonitis. Large volume aspiration during anesthesia induction 8/04. P:   Sputum 8/04 >> Blood 8/4 >>  Rocephin 8/3 >> 8/4  flagyl 8/03 >>  Zosyn 8/4 >>  abx changed to zosyn 8/4, suspect flagyl can be stopped. Will defer timing to [facility]  ENDOCRINE A:   No issues.   P:   Monitor blood sugar on BMET  NEUROLOGIC A:   Sedation off Post-op pain control. P:   RASS goal: 0  TODAY'S SUMMARY: 58 yo male with rectal cancer s/p resection complicated by ileus and anastomosis ischemia required re-exploration.  He developed large volume emesis and aspiration prior to anesthesia induction.  Required pressors post-op.  Main concern will  be for development of ARDS, but CXR 8/5 OK. Repeat CXR and follow clinically  PCCM will sign off. Please call if we can assist  Baltazar Apo, MD, PhD 03/26/2014, 9:02 AM [facility] [phone] or if no answer [phone]

## 2014-03-27 ENCOUNTER — Inpatient Hospital Stay (HOSPITAL_COMMUNITY): Payer: Medicaid Other

## 2014-03-27 DIAGNOSIS — J189 Pneumonia, unspecified organism: Secondary | ICD-10-CM

## 2014-03-27 DIAGNOSIS — I999 Unspecified disorder of circulatory system: Secondary | ICD-10-CM

## 2014-03-27 DIAGNOSIS — B379 Candidiasis, unspecified: Secondary | ICD-10-CM

## 2014-03-27 DIAGNOSIS — C20 Malignant neoplasm of rectum: Principal | ICD-10-CM

## 2014-03-27 LAB — CBC
HCT: 23 % — ABNORMAL LOW (ref 39.0–52.0)
Hemoglobin: 7.8 g/dL — ABNORMAL LOW (ref 13.0–17.0)
MCH: 32 pg (ref 26.0–34.0)
MCHC: 33.9 g/dL (ref 30.0–36.0)
MCV: 94.3 fL (ref 78.0–100.0)
Platelets: 300 10*3/uL (ref 150–400)
RBC: 2.44 MIL/uL — ABNORMAL LOW (ref 4.22–5.81)
RDW: 14.3 % (ref 11.5–15.5)
WBC: 32.6 10*3/uL — ABNORMAL HIGH (ref 4.0–10.5)

## 2014-03-27 LAB — BASIC METABOLIC PANEL
ANION GAP: 10 (ref 5–15)
BUN: 30 mg/dL — ABNORMAL HIGH (ref 6–23)
CALCIUM: 8.2 mg/dL — AB (ref 8.4–10.5)
CO2: 27 mEq/L (ref 19–32)
Chloride: 104 mEq/L (ref 96–112)
Creatinine, Ser: 0.71 mg/dL (ref 0.50–1.35)
GFR calc Af Amer: >90 mL/min (ref >90)
Glucose, Bld: 100 mg/dL — ABNORMAL HIGH (ref 70–99)
Potassium: 3.6 mEq/L — ABNORMAL LOW (ref 3.7–5.3)
SODIUM: 141 meq/L (ref 137–147)

## 2014-03-27 LAB — HEPATITIS PANEL, ACUTE
HCV Ab: NEGATIVE
HEP A IGM: NONREACTIVE
Hep B C IgM: NONREACTIVE
Hepatitis B Surface Ag: NEGATIVE

## 2014-03-27 LAB — HIV ANTIBODY (ROUTINE TESTING W REFLEX): HIV: NONREACTIVE

## 2014-03-27 MED ORDER — SODIUM CHLORIDE 0.9 % IJ SOLN: 10.0000 mL | INTRAMUSCULAR | Status: DC | PRN

## 2014-03-27 MED ORDER — PANTOPRAZOLE SODIUM 40 MG PO TBEC
40.0000 mg | DELAYED_RELEASE_TABLET | Freq: Two times a day (BID) | ORAL | Status: DC
  Administered 2014-03-27 – 2014-04-02 (×13): 40 mg via ORAL
  Filled 2014-03-27 (×14): qty 1

## 2014-03-27 NOTE — Progress Notes (Signed)
[name]  [address] [place], [place] [address] Phone: [phone] FAX: [place] [phone] 04/02/1956  CARE TEAM:  PCP: Connor Battiest, MD  Outpatient Care Team: Patient Care Team: Connor Kirschner, MD as PCP - General (Family Medicine) Connor Sark, MD as Consulting Physician (Cardiology) Connor Binder, MD as Consulting Physician (Gastroenterology) Connor Banister, MD as Attending Physician (Gastroenterology) Connor Gobble, MD as Consulting Physician (Medical Oncology) Connor Hector, MD as Consulting Physician (General Surgery)  Inpatient Treatment Team: Treatment Team: Attending Provider: Adin Hector, MD; Registered Nurse: Connor Oppenheim, RN; Dietitian: [name], RD; Physical Therapist: Josiah [name], PT   Subjective:  Extubated.  On nasal cannula.  On ice chips.  Tol NGT clamping all day - no nausea/emesis  Wife.  Tired but pain controlled  Objective:  Vital signs:  Filed Vitals:   03/27/14 0000 03/27/14 0100 03/27/14 0200 03/27/14 0400  BP: 124/74  113/77 128/83  Pulse:      Temp: 98.9 F (37.2 C)   98.8 F (37.1 C)  TempSrc: Oral   Oral  Resp: _0 Height:      Weight:    191 lb 9.3 oz (86.9 kg)  SpO2: 95% 95% 95%     Last BM Date: 03/21/14 (ileostomy)  Intake/Output   Yesterday:  08/06 0701 - 08/07 0700 In: 1976.2 [I.V.:206.2; NG/GT:600; IV Piggyback:450] Out: 8416 [Urine:930; Drains:325; Stool:550] This shift:  Total I/O In: 1020 [I.V.:100; Other:360; NG/GT:360; IV Piggyback:200] Out: 19 [Urine:350; Drains:110; Stool:150]  Bowel function:  Flatus: yes - increased  BM: more effluent in ileostomy bag  Drain: serosanguinous  NGT: scant thin green  Physical Exam:  General: Pt awake/alert/oriented x4 in no acute distress.  Tired but not toxic. Eyes: PERRL, normal EOM.  Sclera clear.  No icterus Neuro: CN II-XII intact w/o  focal sensory/motor deficits. Lymph: No head/neck/groin lymphadenopathy Psych:  No delerium/psychosis/paranoia HENT: Normocephalic, Mucus membranes moist.  No thrush.  NGT in place Neck: Supple, No tracheal deviation Chest: No chest wall pain w good excursion CV:  Pulses intact.  Regular rhythm MS: Normal AROM mjr joints.  No obvious deformity Abdomen: Soft.  Mod distended.   Ileostomy & end colostomy edematous but viable.  Serous fluid in bag.  No evidence of peritonitis.  No incarcerated hernias.  Wound vac in place & clean Ext:  SCDs BLE.  No mjr edema.  No cyanosis Skin: No petechiae / purpura   Problem List:   Principal Problem:   Rectal cancer, uT2uN0, s/p LAR/coloanal/ileostomy 03/20/2014 Active Problems:   Tobacco abuse, in remission   GERD (gastroesophageal reflux disease)   Obesity (BMI 30-39.9)   ards   Aspiration pneumonia   Assessment  [name]  58 y.o. male  3 Days Post-Op    PROCEDURE 03/20/2014:  ROBOTIC ASSISTED low anterior resection  TRANSANAL TRANSABDOMINAL TME WITH HANDSEWN COLOANAL ANASTOMOSIS  splenic flexure mobilization  diverting loop ileostomy  PROCEDURE 03/24/2014:  FLEXIBLE SIGMOIDOSCOPY  Exploratory laparotomy  Takedown of coloanal anastomosis  Distal colectomy & end colostomy  Wound vac     Recovering relatively well only from laparotomy for ischemic/necrotic colon.  Ileus resolving  Plan:  Agree with IV Zosyn for probable aspiration pneumonitis/PNA.  Secretions were minimal and He has tolerated extubation for the past 24 hours.  That is hopeful sign.  We will see  D/c NGT & try clears.  GO SLOWLY  Ileostomy care.  Diuresis. - KVO & PRN IVF.  Try to avoid diuretics for now  Follow electrolytes.  Stable K   PPI for GERD.  Change to PO  Condom cath.  Pathology T2 N0.  Stage I. Cure rate very high.  Just need to help get him through this stressful period  VTE prophylaxis- SCDs, etc  Possible transfer to floor tomorrow  if better  I updated the patient's status to the patient & wife & ICU RN.  Recommendations were made.  Questions were answered.  The patient expressed understanding & appreciation.   [name], M.D., F.A.C.S. Gastrointestinal and Minimally Invasive Surgery [facility] [address] [place], [place] [address] [phone] Main / Paging   03/27/2014   Results:   Labs: Results for orders placed during the hospital encounter of 03/20/14 (from the past 48 hour(s))  CULTURE, RESPIRATORY (NON-EXPECTORATED)     Status: None   Collection Time    03/25/14  9:20 AM      Result Value Ref Range   Specimen Description ENDOTRACHEAL     Special Requests NONE     Gram Stain       Value: FEW WBC PRESENT, PREDOMINANTLY PMN     NO SQUAMOUS EPITHELIAL CELLS SEEN     FEW YEAST     Performed at [facility]   Culture       Value: Culture reincubated for better growth     Performed at [facility]   Report Status PENDING    CBC     Status: Abnormal   Collection Time    03/25/14 10:31 AM      Result Value Ref Range   WBC 21.8 (*) 4.0 - 10.5 K/uL   RBC 3.12 (*) 4.22 - 5.81 MIL/uL   Hemoglobin 9.8 (*) 13.0 - 17.0 g/dL   HCT 28.7 (*) 39.0 - 52.0 %   MCV 92.0  78.0 - 100.0 fL   MCH 31.4  26.0 - 34.0 pg   MCHC 34.1  30.0 - 36.0 g/dL   RDW 13.8  11.5 - 15.5 %   Platelets 245  150 - 400 K/uL  BASIC METABOLIC PANEL     Status: Abnormal   Collection Time    03/25/14 10:31 AM      Result Value Ref Range   Sodium 135 (*) 137 - 147 mEq/L   Potassium 5.1  3.7 - 5.3 mEq/L   Chloride 101  96 - 112 mEq/L   CO2 23  19 - 32 mEq/L   Glucose, Bld 147 (*) 70 - 99 mg/dL   BUN 31 (*) 6 - 23 mg/dL   Creatinine, Ser 0.77  0.50 - 1.35 mg/dL   Calcium 7.8 (*) 8.4 - 10.5 mg/dL   GFR calc non Af Amer >90  >90 mL/min   GFR calc Af Amer >90  >90 mL/min   Comment: (NOTE)     The eGFR has been calculated using the CKD EPI equation.     This calculation  has not been validated in all clinical situations.     eGFR's persistently <90 mL/min signify possible Chronic Kidney     Disease.   Anion gap 11  5 - 15  CBC     Status: Abnormal   Collection Time    03/26/14  6:00 AM      Result Value Ref Range   WBC 23.8 (*) 4.0 - 10.5 K/uL   RBC 2.43 (*)  4.22 - 5.81 MIL/uL   Hemoglobin 7.5 (*) 13.0 - 17.0 g/dL   Comment: DELTA CHECK NOTED     REPEATED TO VERIFY   HCT 22.7 (*) 39.0 - 52.0 %   MCV 93.4  78.0 - 100.0 fL   MCH 30.9  26.0 - 34.0 pg   MCHC 33.0  30.0 - 36.0 g/dL   RDW 14.2  11.5 - 15.5 %   Platelets 254  150 - 400 K/uL  BASIC METABOLIC PANEL     Status: Abnormal   Collection Time    03/26/14  6:00 AM      Result Value Ref Range   Sodium 137  137 - 147 mEq/L   Potassium 4.0  3.7 - 5.3 mEq/L   Comment: DELTA CHECK NOTED     REPEATED TO VERIFY   Chloride 101  96 - 112 mEq/L   CO2 27  19 - 32 mEq/L   Glucose, Bld 97  70 - 99 mg/dL   BUN 27 (*) 6 - 23 mg/dL   Creatinine, Ser 0.75  0.50 - 1.35 mg/dL   Calcium 7.8 (*) 8.4 - 10.5 mg/dL   GFR calc non Af Amer >90  >90 mL/min   GFR calc Af Amer >90  >90 mL/min   Comment: (NOTE)     The eGFR has been calculated using the CKD EPI equation.     This calculation has not been validated in all clinical situations.     eGFR's persistently <90 mL/min signify possible Chronic Kidney     Disease.   Anion gap 9  5 - 15  CBC     Status: Abnormal   Collection Time    03/27/14  5:21 AM      Result Value Ref Range   WBC 32.6 (*) 4.0 - 10.5 K/uL   Comment: REPEATED TO VERIFY   RBC 2.44 (*) 4.22 - 5.81 MIL/uL   Hemoglobin 7.8 (*) 13.0 - 17.0 g/dL   HCT 23.0 (*) 39.0 - 52.0 %   MCV 94.3  78.0 - 100.0 fL   MCH 32.0  26.0 - 34.0 pg   MCHC 33.9  30.0 - 36.0 g/dL   RDW 14.3  11.5 - 15.5 %   Platelets 300  150 - 400 K/uL  BASIC METABOLIC PANEL     Status: Abnormal   Collection Time    03/27/14  5:21 AM      Result Value Ref Range   Sodium 141  137 - 147 mEq/L   Potassium 3.6 (*) 3.7 - 5.3  mEq/L   Chloride 104  96 - 112 mEq/L   CO2 27  19 - 32 mEq/L   Glucose, Bld 100 (*) 70 - 99 mg/dL   BUN 30 (*) 6 - 23 mg/dL   Creatinine, Ser 0.71  0.50 - 1.35 mg/dL   Calcium 8.2 (*) 8.4 - 10.5 mg/dL   GFR calc non Af Amer >90  >90 mL/min   GFR calc Af Amer >90  >90 mL/min   Comment: (NOTE)     The eGFR has been calculated using the CKD EPI equation.     This calculation has not been validated in all clinical situations.     eGFR's persistently <90 mL/min signify possible Chronic Kidney     Disease.   Anion gap 10  5 - 15    Imaging / Studies: Dg Chest Port 1 View  03/27/2014   CLINICAL DATA:  Evaluate infiltrates  EXAM: PORTABLE CHEST -  1 VIEW  COMPARISON:  03/25/2014  FINDINGS: Tracheal extubation. Orogastric tube continues to cross the diaphragm, tip either in the distal stomach or proximal duodenum. Unchanged positioning of left upper extremity PICC.  Borderline cardiomegaly. Unchanged upper mediastinal contours. Diffuse interstitial coarsening is unchanged. This may represent pneumonitis given previous history of aspiration. No evidence of effusion or pneumothorax.  IMPRESSION: Stable pulmonary inflation after extubation.Unchanged interstitial coarsening, likely pneumonitis related to history of aspiration.   Electronically Signed   By: Jorje Guild M.D.   On: 03/27/2014 06:18    Medications / Allergies: per chart  Antibiotics: Anti-infectives   Start     Dose/Rate Route Frequency Ordered Stop   03/27/14 0000  micafungin (MYCAMINE) 100 mg in sodium chloride 0.9 % 100 mL IVPB     100 mg 100 mL/hr over 1 Hours Intravenous Daily at bedtime 03/26/14 2356     03/25/14 0800  cefTRIAXone (ROCEPHIN) 2 g in dextrose 5 % 50 mL IVPB  Status:  Discontinued    Comments:  Pharmacy may adjust dosing strength / duration / interval for maximal efficacy   2 g 100 mL/hr over 30 Minutes Intravenous Every 24 hours 03/24/14 1655 03/24/14 2102   03/24/14 2200  piperacillin-tazobactam (ZOSYN) IVPB  3.375 g     3.375 g 12.5 mL/hr over 240 Minutes Intravenous 3 times per day 03/24/14 2133 03/31/14 1824   03/24/14 2000  metroNIDAZOLE (FLAGYL) IVPB 500 mg  Status:  Discontinued     500 mg 100 mL/hr over 60 Minutes Intravenous Every 6 hours 03/24/14 1655 03/26/14 1824   03/24/14 0730  [MAR Hold]  cefoTEtan (CEFOTAN) 2 g in dextrose 5 % 50 mL IVPB     (On MAR Hold since 03/24/14 1125)  Comments:  Pharmacy may adjust dose strength for optimal dosing.   Send with patient on call to the OR.  Anesthesia to complete antibiotic administration <50mn [facility]   2 g 100 mL/hr over 30 Minutes Intravenous On call to O.R. 03/24/14 0921108/04/15 1232   03/23/14 0830  cefTRIAXone (ROCEPHIN) 2 g in dextrose 5 % 50 mL IVPB  Status:  Discontinued    Comments:  Pharmacy may adjust dosing strength / duration / interval for maximal efficacy   2 g 100 mL/hr over 30 Minutes Intravenous Every 24 hours 03/23/14 0738 03/24/14 1655   03/23/14 0800  metroNIDAZOLE (FLAGYL) IVPB 500 mg  Status:  Discontinued     500 mg 100 mL/hr over 60 Minutes Intravenous Every 6 hours 03/23/14 0738 03/24/14 1655   03/20/14 2359  cefoTEtan (CEFOTAN) 2 g in dextrose 5 % 50 mL IVPB     2 g 100 mL/hr over 30 Minutes Intravenous Every 12 hours 03/20/14 1704 03/20/14 2345   03/20/14 1228  clindamycin (CLEOCIN) 900 mg, gentamicin (GARAMYCIN) 240 mg in sodium chloride 0.9 % 1,000 mL for intraperitoneal lavage  Status:  Discontinued       As needed 03/20/14 1228 03/20/14 1329   03/20/14 0518  cefoTEtan (CEFOTAN) 2 g in dextrose 5 % 50 mL IVPB     2 g 100 mL/hr over 30 Minutes Intravenous On call to O.R. 03/20/14 0518 03/20/14 1259   03/20/14 0000  clindamycin (CLEOCIN) 900 mg, gentamicin (GARAMYCIN) 240 mg in sodium chloride 0.9 % 1,000 mL for intraperitoneal lavage  Status:  Discontinued    Comments:  Pharmacy may adjust dosing strength, schedule, rate of infusion, etc as needed to optimize therapy     Intraperitoneal To  Surgery 03/19/14 1341 03/20/14 1453   03/19/14 1345  clindamycin (CLEOCIN) 900 mg, gentamicin (GARAMYCIN) 240 mg in sodium chloride 0.9 % 1,000 mL for intraperitoneal lavage  Status:  Discontinued    Comments:  Pharmacy may adjust dosing strength, schedule, rate of infusion, etc as needed to optimize therapy   1 application Intraperitoneal To Surgery 03/19/14 1339 03/19/14 1341       Note: Portions of this report may have been transcribed using voice recognition software. Every effort was made to ensure accuracy; however, inadvertent computerized transcription errors may be present.   Any transcriptional errors that result from this process are unintentional.

## 2014-03-27 NOTE — Progress Notes (Signed)
ANTIBIOTIC CONSULT NOTE - follow up  Pharmacy Consult for Zosyn Indication: Aspiration PNA  No Known Allergies  Patient Measurements: Height: 5\' 5"  (165.1 cm) Weight: 191 lb 9.3 oz (86.9 kg) IBW/kg (Calculated) : 61.5 Adjusted Body Weight:   Vital Signs: Temp: 98.8 F (37.1 C) (08/07 0400) Temp src: Oral (08/07 0400) BP: 136/81 mmHg (08/07 0800) Intake/Output from previous day: 08/06 0701 - 08/07 0700 In: 1976.2 [I.V.:206.2; NG/GT:600; IV Piggyback:450] Out: 1970 [BMWUX:3244; Drains:325; Stool:550] Intake/Output from this shift: Total I/O In: 10 [I.V.:10] Out: 125 [Urine:125]  Labs:  Recent Labs  03/25/14 1031 03/26/14 0600 03/27/14 0521  WBC 21.8* 23.8* 32.6*  HGB 9.8* 7.5* 7.8*  PLT 245 254 300  CREATININE 0.77 0.75 0.71   Estimated Creatinine Clearance: 103.3 ml/min (by C-G formula based on Cr of 0.71). No results found for this basename: VANCOTROUGH, Corlis Leak, VANCORANDOM, [place], GENTPEAK, GENTRANDOM, TOBRATROUGH, TOBRAPEAK, TOBRARND, AMIKACINPEAK, AMIKACINTROU, AMIKACIN,  in the last 72 hours   Microbiology: Recent Results (from the past 720 hour(s))  MRSA PCR SCREENING     Status: None   Collection Time    03/24/14  5:00 PM      Result Value Ref Range Status   MRSA by PCR NEGATIVE  NEGATIVE Final   Comment:            The GeneXpert MRSA Assay (FDA     approved for NASAL specimens     only), is one component of a     comprehensive MRSA colonization     surveillance program. It is not     intended to diagnose MRSA     infection nor to guide or     monitor treatment for     MRSA infections.  CULTURE, BLOOD (ROUTINE X 2)     Status: None   Collection Time    03/24/14  9:50 PM      Result Value Ref Range Status   Specimen Description BLOOD R FOOT   Final   Special Requests BOTTLES DRAWN AEROBIC AND ANAEROBIC 5 CC EACH   Final   Culture  Setup Time     Final   Value: 03/25/2014 00:20     Performed at [facility]   Culture     Final   Value: YEAST     Note: Gram Stain Report Called to,Read Back By and Verified With: Darrin Nipper ON 03/26/2014 AT 8:00P BY WILEJ     Performed at [facility]   Report Status PENDING   Incomplete  CULTURE, BLOOD (ROUTINE X 2)     Status: None   Collection Time    03/24/14 10:00 PM      Result Value Ref Range Status   Specimen Description BLOOD L FOOT   Final   Special Requests BOTTLES DRAWN AEROBIC AND ANAEROBIC 5 CC EACH   Final   Culture  Setup Time     Final   Value: 03/25/2014 00:20     Performed at [facility]   Culture     Final   Value:        BLOOD CULTURE RECEIVED NO GROWTH TO DATE CULTURE WILL BE HELD FOR 5 DAYS BEFORE ISSUING A FINAL NEGATIVE REPORT     Performed at [facility]   Report Status PENDING   Incomplete  CULTURE, RESPIRATORY (NON-EXPECTORATED)     Status: None   Collection Time    03/25/14  9:20 AM      Result Value Ref Range Status  Specimen Description ENDOTRACHEAL   Final   Special Requests NONE   Final   Gram Stain     Final   Value: FEW WBC PRESENT, PREDOMINANTLY PMN     NO SQUAMOUS EPITHELIAL CELLS SEEN     FEW YEAST     Performed at [facility]   Culture     Final   Value: FEW PSEUDOMONAS AERUGINOSA     ABUNDANT CANDIDA ALBICANS     Performed at [facility]   Report Status PENDING   Incomplete    Medical History: Past Medical History  Diagnosis Date  . GERD (gastroesophageal reflux disease)   . Shortness of breath     due to weight gain poststopping smokeing  . Arthritis     Assessment: 89 yoM with newly diagnosed rectal cancer s/p low anterior resection and diverting loop ileostostomy on 7/31, developed ileus, tachycardia, emesis 8/03. He was taken to OR for re-assessment and found to have ischemia of colonanal anastomosis. He developed emesis and aspiration during anesthesia induction. He returned to ICU on vent and hypotensive.  Pharmacy consulted to dose zosyn for aspiration PNA.   8/3 >> CTX  >> 8/4 8/4 >> cefotetan preop x 1 8/4 >> zosyn  8/4 >> metronidazole >> 8/6 8/7 >> micafungin >>  Tmax: afebrile WBCs: 32.6, up and rising Renal: SCr stable CrC l >100  8/4 blood: yeast 1 of 2 8/4 sputum: NGTD 8/4 MRSA PCR negative  Goal of Therapy:  Eradication of infection   Plan:  1) Continue Zosyn 3.375g IV q8h (infuse over 4 hours) 2) Follow up yeast cultures with micafungin being started    Adrian Saran, PharmD, BCPS Pager [phone] 03/27/2014 9:43 AM

## 2014-03-27 NOTE — Consult Note (Signed)
[facility] for Infectious Disease    Date of Admission:  03/20/2014  Date of Consult:  03/27/2014  Reason for Consult: Auto consult for Candidemia Referring Physician: auto-consult and Dr. Johney Maine    HPI: [name] is an 58 y.o. male former smoker with rectal bleeding found to have rectal cancer. He had robotic assisted low anterior resection and diverting loop ileostomy on 03/20/14. He developed ileus, tachycardia, emesis 8/03. He was taken to OR  He had Exploratory laparotomy and found to have ischemia and necorsis of colonanal anastomosis sp Takedown of coloanal anastomosis Distal colectomy & end colostomy Wound vac placement with respiratory failure sp reintubation.  He has been on broad term antibiotics recently in the form of Zosyn for possible pneumonia, and also for coverage of intra-abdominal pathology.  In the interim he has developed candidemia with the yeast growing from blood culture.  I started him on Clinton Gallant. Repeat blood cultures.     Past Medical History  Diagnosis Date  . GERD (gastroesophageal reflux disease)   . Shortness of breath     due to weight gain poststopping smokeing  . Arthritis     Past Surgical History  Procedure Laterality Date  . Colonoscopy N/A 12/22/2013    Procedure: COLONOSCOPY;  Surgeon: Danie Binder, MD;  Location: [facility];  Service: Endoscopy;  Laterality: N/A;  8:30  . Esophagogastroduodenoscopy N/A 12/22/2013    Procedure: ESOPHAGOGASTRODUODENOSCOPY (EGD);  Surgeon: Danie Binder, MD;  Location: [facility];  Service: Endoscopy;  Laterality: N/A;  . Eus N/A 01/01/2014    Procedure: LOWER ENDOSCOPIC ULTRASOUND (EUS);  Surgeon: Milus Banister, MD;  Location: [facility];  Service: Endoscopy;  Laterality: N/A;  . Hip fracture surgery Left age 43 on 04-06-1970  . Eye reattachment and ear reattachment  Right 1971  . Colon resection N/A 03/24/2014    Procedure: Diagnostic  laparotomy, takedown of coloanal  anastamosis, end colostomy, with wound vac;  Surgeon: Adin Hector, MD;  Location: [facility];  Service: General;  Laterality: N/A;  . Flexible sigmoidoscopy N/A 03/24/2014    Procedure: FLEXIBLE SIGMOIDOSCOPY;  Surgeon: Adin Hector, MD;  Location: [facility];  Service: General;  Laterality: N/A;  ergies:   No Known Allergies   Medications: I have reviewed patients current medications as documented in Epic Anti-infectives   Start     Dose/Rate Route Frequency Ordered Stop   03/27/14 0000  micafungin (MYCAMINE) 100 mg in sodium chloride 0.9 % 100 mL IVPB     100 mg 100 mL/hr over 1 Hours Intravenous Daily at bedtime 03/26/14 2356     03/25/14 0800  cefTRIAXone (ROCEPHIN) 2 g in dextrose 5 % 50 mL IVPB  Status:  Discontinued    Comments:  Pharmacy may adjust dosing strength / duration / interval for maximal efficacy   2 g 100 mL/hr over 30 Minutes Intravenous Every 24 hours 03/24/14 1655 03/24/14 2102   03/24/14 2200  piperacillin-tazobactam (ZOSYN) IVPB 3.375 g     3.375 g 12.5 mL/hr over 240 Minutes Intravenous 3 times per day 03/24/14 2133 03/31/14 1824   03/24/14 2000  metroNIDAZOLE (FLAGYL) IVPB 500 mg  Status:  Discontinued     500 mg 100 mL/hr over 60 Minutes Intravenous Every 6 hours 03/24/14 1655 03/26/14 1824   03/24/14 0730  [MAR Hold]  cefoTEtan (CEFOTAN) 2 g in dextrose 5 % 50 mL IVPB     (On MAR Hold since 03/24/14 1125)  Comments:  Pharmacy may adjust dose strength for optimal dosing.   Send with patient on call to the OR.  Anesthesia to complete antibiotic administration <17mn [facility]   2 g 100 mL/hr over 30 Minutes Intravenous On call to O.R. 03/24/14 0482508/04/15 1232   03/23/14 0830  cefTRIAXone (ROCEPHIN) 2 g in dextrose 5 % 50 mL IVPB  Status:  Discontinued    Comments:  Pharmacy may adjust dosing strength / duration / interval for maximal efficacy   2 g 100 mL/hr over 30 Minutes Intravenous Every 24 hours 03/23/14 0738 03/24/14 1655    03/23/14 0800  metroNIDAZOLE (FLAGYL) IVPB 500 mg  Status:  Discontinued     500 mg 100 mL/hr over 60 Minutes Intravenous Every 6 hours 03/23/14 0738 03/24/14 1655   03/20/14 2359  cefoTEtan (CEFOTAN) 2 g in dextrose 5 % 50 mL IVPB     2 g 100 mL/hr over 30 Minutes Intravenous Every 12 hours 03/20/14 1704 03/20/14 2345   03/20/14 1228  clindamycin (CLEOCIN) 900 mg, gentamicin (GARAMYCIN) 240 mg in sodium chloride 0.9 % 1,000 mL for intraperitoneal lavage  Status:  Discontinued       As needed 03/20/14 1228 03/20/14 1329   03/20/14 0518  cefoTEtan (CEFOTAN) 2 g in dextrose 5 % 50 mL IVPB     2 g 100 mL/hr over 30 Minutes Intravenous On call to O.R. 03/20/14 0518 03/20/14 1259   03/20/14 0000  clindamycin (CLEOCIN) 900 mg, gentamicin (GARAMYCIN) 240 mg in sodium chloride 0.9 % 1,000 mL for intraperitoneal lavage  Status:  Discontinued    Comments:  Pharmacy may adjust dosing strength, schedule, rate of infusion, etc as needed to optimize therapy    Intraperitoneal To Surgery 03/19/14 1341 03/20/14 1453   03/19/14 1345  clindamycin (CLEOCIN) 900 mg, gentamicin (GARAMYCIN) 240 mg in sodium chloride 0.9 % 1,000 mL for intraperitoneal lavage  Status:  Discontinued    Comments:  Pharmacy may adjust dosing strength, schedule, rate of infusion, etc as needed to optimize therapy   1 application Intraperitoneal To Surgery 03/19/14 1339 03/19/14 1341      Social History:  reports that he quit smoking about 2 years ago. His smoking use included Cigars. He has never used smokeless tobacco. He reports that he does not drink alcohol or use illicit drugs.  Family History  Problem Relation Age of Onset  . Colon polyps Neg Hx   . Colon cancer Sister     As in HPI and primary teams notes otherwise 12 point review of systems is negative  Blood pressure 142/93, pulse 101, temperature 98.7 F (37.1 C), temperature source Oral, resp. rate 24, height '5\' 5"'  (1.651 m), weight 191 lb 9.3 oz (86.9 kg), SpO2  94.00%. General: Alert and awake, oriented x3, not in any acute distress. HEENT: anicteric sclera,  EOMI, oropharynx clear and without exudate CVS regular rate, normal r,  no murmur rubs or gallops Chest: Few rhonchi Abdomen: soft nontender, nondistended,, to [place] and is visible and tissue appears healthy in bag. Extremities: no  clubbing or edema noted bilaterally Skin: no rashes Neuro: nonfocal, strength and sensation intact   Results for orders placed during the hospital encounter of 03/20/14 (from the past 48 hour(s))  CBC     Status: Abnormal   Collection Time    03/26/14  6:00 AM      Result Value Ref Range   WBC 23.8 (*) 4.0 - 10.5 K/uL   RBC 2.43 (*)  4.22 - 5.81 MIL/uL   Hemoglobin 7.5 (*) 13.0 - 17.0 g/dL   Comment: DELTA CHECK NOTED     REPEATED TO VERIFY   HCT 22.7 (*) 39.0 - 52.0 %   MCV 93.4  78.0 - 100.0 fL   MCH 30.9  26.0 - 34.0 pg   MCHC 33.0  30.0 - 36.0 g/dL   RDW 14.2  11.5 - 15.5 %   Platelets 254  150 - 400 K/uL  BASIC METABOLIC PANEL     Status: Abnormal   Collection Time    03/26/14  6:00 AM      Result Value Ref Range   Sodium 137  137 - 147 mEq/L   Potassium 4.0  3.7 - 5.3 mEq/L   Comment: DELTA CHECK NOTED     REPEATED TO VERIFY   Chloride 101  96 - 112 mEq/L   CO2 27  19 - 32 mEq/L   Glucose, Bld 97  70 - 99 mg/dL   BUN 27 (*) 6 - 23 mg/dL   Creatinine, Ser 0.75  0.50 - 1.35 mg/dL   Calcium 7.8 (*) 8.4 - 10.5 mg/dL   GFR calc non Af Amer >90  >90 mL/min   GFR calc Af Amer >90  >90 mL/min   Comment: (NOTE)     The eGFR has been calculated using the CKD EPI equation.     This calculation has not been validated in all clinical situations.     eGFR's persistently <90 mL/min signify possible Chronic Kidney     Disease.   Anion gap 9  5 - 15  CBC     Status: Abnormal   Collection Time    03/27/14  5:21 AM      Result Value Ref Range   WBC 32.6 (*) 4.0 - 10.5 K/uL   Comment: REPEATED TO VERIFY   RBC 2.44 (*) 4.22 - 5.81 MIL/uL    Hemoglobin 7.8 (*) 13.0 - 17.0 g/dL   HCT 23.0 (*) 39.0 - 52.0 %   MCV 94.3  78.0 - 100.0 fL   MCH 32.0  26.0 - 34.0 pg   MCHC 33.9  30.0 - 36.0 g/dL   RDW 14.3  11.5 - 15.5 %   Platelets 300  150 - 400 K/uL  BASIC METABOLIC PANEL     Status: Abnormal   Collection Time    03/27/14  5:21 AM      Result Value Ref Range   Sodium 141  137 - 147 mEq/L   Potassium 3.6 (*) 3.7 - 5.3 mEq/L   Chloride 104  96 - 112 mEq/L   CO2 27  19 - 32 mEq/L   Glucose, Bld 100 (*) 70 - 99 mg/dL   BUN 30 (*) 6 - 23 mg/dL   Creatinine, Ser 0.71  0.50 - 1.35 mg/dL   Calcium 8.2 (*) 8.4 - 10.5 mg/dL   GFR calc non Af Amer >90  >90 mL/min   GFR calc Af Amer >90  >90 mL/min   Comment: (NOTE)     The eGFR has been calculated using the CKD EPI equation.     This calculation has not been validated in all clinical situations.     eGFR's persistently <90 mL/min signify possible Chronic Kidney     Disease.   Anion gap 10  5 - 15  HIV ANTIBODY (ROUTINE TESTING)     Status: None   Collection Time    03/27/14  5:21 AM  Result Value Ref Range   HIV 1&2 Ab, 4th Generation NONREACTIVE  NONREACTIVE   Comment: (NOTE)     A NONREACTIVE HIV Ag/Ab result does not exclude HIV infection since     the time frame for seroconversion is variable. If acute HIV infection     is suspected, a HIV-1 RNA Qualitative TMA test is recommended.     HIV-1/2 Antibody Diff         Not indicated.     HIV-1 RNA, Qual TMA           Not indicated.     PLEASE NOTE: This information has been disclosed to you from records     whose confidentiality may be protected by state law. If your state     requires such protection, then the state law prohibits you from making     any further disclosure of the information without the specific written     consent of the person to whom it pertains, or as otherwise permitted     by law. A general authorization for the release of medical or other     information is NOT sufficient for this purpose.      The performance of this assay has not been clinically validated in     patients less than 59 years old.     Performed at [place] [facility], ACUTE     Status: None   Collection Time    03/27/14  5:21 AM      Result Value Ref Range   Hepatitis B Surface Ag NEGATIVE  NEGATIVE   HCV Ab NEGATIVE  NEGATIVE   Hep A IgM NON REACTIVE  NON REACTIVE   Hep B C IgM NON REACTIVE  NON REACTIVE   Comment: (NOTE)     High levels of Hepatitis B Core IgM antibody are detectable     during the acute stage of Hepatitis B. This antibody is used     to differentiate current from past HBV infection.     Performed at [facility]   '@BRIEFLABTABLE' (sdes,specrequest,cult,reptstatus)   ) Recent Results (from the past 720 hour(s))  MRSA PCR SCREENING     Status: None   Collection Time    03/24/14  5:00 PM      Result Value Ref Range Status   MRSA by PCR NEGATIVE  NEGATIVE Final   Comment:            The GeneXpert MRSA Assay (FDA     approved for NASAL specimens     only), is one component of a     comprehensive MRSA colonization     surveillance program. It is not     intended to diagnose MRSA     infection nor to guide or     monitor treatment for     MRSA infections.  CULTURE, BLOOD (ROUTINE X 2)     Status: None   Collection Time    03/24/14  9:50 PM      Result Value Ref Range Status   Specimen Description BLOOD R FOOT   Final   Special Requests BOTTLES DRAWN AEROBIC AND ANAEROBIC 5 CC EACH   Final   Culture  Setup Time     Final   Value: 03/25/2014 00:20     Performed at [facility]   Culture     Final   Value: YEAST     Note: Gram Stain Report Called to,Read Back By and  Verified With: Darrin Nipper ON 03/26/2014 AT 8:00P BY WILEJ     Performed at [facility]   Report Status PENDING   Incomplete  CULTURE, BLOOD (ROUTINE X 2)     Status: None   Collection Time    03/24/14 10:00 PM      Result Value Ref Range Status   Specimen Description  BLOOD L FOOT   Final   Special Requests BOTTLES DRAWN AEROBIC AND ANAEROBIC 5 CC EACH   Final   Culture  Setup Time     Final   Value: 03/25/2014 00:20     Performed at [facility]   Culture     Final   Value:        BLOOD CULTURE RECEIVED NO GROWTH TO DATE CULTURE WILL BE HELD FOR 5 DAYS BEFORE ISSUING A FINAL NEGATIVE REPORT     Performed at [facility]   Report Status PENDING   Incomplete  CULTURE, RESPIRATORY (NON-EXPECTORATED)     Status: None   Collection Time    03/25/14  9:20 AM      Result Value Ref Range Status   Specimen Description ENDOTRACHEAL   Final   Special Requests NONE   Final   Gram Stain     Final   Value: FEW WBC PRESENT, PREDOMINANTLY PMN     NO SQUAMOUS EPITHELIAL CELLS SEEN     FEW YEAST     Performed at [facility]   Culture     Final   Value: FEW PSEUDOMONAS AERUGINOSA     ABUNDANT CANDIDA ALBICANS     Performed at [facility]   Report Status PENDING   Incomplete     Impression/Recommendation  Principal Problem:   Rectal cancer, uT2uN0, s/p LAR/coloanal/ileostomy 03/20/2014 Active Problems:   Tobacco abuse, in remission   GERD (gastroesophageal reflux disease)   Obesity (BMI 30-39.9)   ards   Aspiration pneumonia   [name] is a 58 y.o. male with   rectal cancer sp robotic assisted low anterior resection and diverting loop ileostomy on 03/20/14. Complicated by ischemia and necorsis of colonanal anastomosis sp Takedown of coloanal anastomosis Distal colectomy & end colostomy Wound vac placement with respiratory failure sp reintubation, now found to have candidemia  #1 candidemia:  --Continue micafungin for now. If this is Candida albicans we could consider changing him to fluconazole his most Candida albicans is sensitive to consult.  --Discontinue PICC line --Culture catheter tip from PICC  --Repeat blood cultures after removal of PICC line  --Patient has no eye symptoms at present but per ID  assay guidelines she should have a formal ophthalmologic O. funduscopic exam prior to discharge the hospital.  He'll require at least 2 weeks of effective antifungal therapy.  #2 possible health care associated pneumonia:  Zosyn seems resolved me and he is growing Pseudomonas and on one culture from the fifth.  #3 ischemia intra-abdominal necrosis: Patient is also probably covered for intra-abdominal infection with Zosyn.  #4 screening check for HIV and hepatitis panel.       03/27/2014, 3:51 PM   Thank you so much for this interesting consult  [facility] [place] [phone] (pager) [phone] (office) 03/27/2014, 3:51 PM  Rhina [place] 03/27/2014, 3:51 PM

## 2014-03-27 NOTE — Progress Notes (Signed)
PT Cancellation Note  Patient Details Name: [name] MRN: [id number] DOB: 1956/07/07   Cancelled Treatment:    Reason Eval/Treat Not Completed: pt declined participation with PT at this time. Pt and RN report OOB earlier today. Pt politely requested PT check back on tomorrow.    Weston Anna, MPT Pager: [phone]

## 2014-03-27 NOTE — Consult Note (Signed)
WOC wound follow up Wound type: surgical wound Measurement: 14.5cm x 3.5cm x 2.5cm  Wound bed: subcutaneous tissue, clean, pink, moist Drainage (amount, consistency, odor) minimal, serosanguinous  Periwound: intact  Dressing procedure/placement/frequency: 1pc black granufoam used to fill the wound bed. Seal at 123mmHG. Pt tolerated well.  [place] for bedside nursing staff to continue [facility] dressing changes M/W/F.  WOC ostomy follow up Stoma type/location: LLQ loop ileostomy Stomal assessment/size: 2" slightly vertical oval shaped, budded  Peristomal assessment:  Intact, he has old drain site that is in the pouching field that I have cut the tape border today to avoid coverage Treatment options for stomal/peristomal skin:  None needed Output liquid, green Ostomy pouching: 2pc. 2 3/4" pouch used today Education provided: reviewed pouch change with patient and his wife. He was able to recall much of the information my partner had taught.  I demonstrated "burping" the pouch and how to measure stoma Now that patient has two stomas we reviewed the function of both of the stomas.  Pt very talkative and ask appropriate questions during my visit today.  Enrolled patient in [place] Start Discharge program: Yes per my partner previously.   WOC ostomy consult note Stoma type/location:  RLQ, end colosotmy Stomal assessment/size: 1 3/4" round, budded from the skin, pink and moist Peristomal assessment: intact Treatment options for stomal/peristomal skin: none Output none Ostomy pouching: 2pc. 2 3/4' pouch used today, I have ordered 2 1/4" pouch for use with next pouch change due to size of stoma  Education provided: see above  [facility] team will follow along with you for continued ostomy care and teaching.   [place] for bedside nursing to assume care of the NPWT midline abdominal wound.  Diondra [place] RN,CWOCN [phone]

## 2014-03-28 ENCOUNTER — Inpatient Hospital Stay (HOSPITAL_COMMUNITY): Payer: Medicaid Other

## 2014-03-28 DIAGNOSIS — J690 Pneumonitis due to inhalation of food and vomit: Secondary | ICD-10-CM

## 2014-03-28 DIAGNOSIS — B49 Unspecified mycosis: Secondary | ICD-10-CM

## 2014-03-28 LAB — COMPREHENSIVE METABOLIC PANEL
ALT: 18 U/L (ref 0–53)
AST: 12 U/L (ref 0–37)
Albumin: 1.4 g/dL — ABNORMAL LOW (ref 3.5–5.2)
Alkaline Phosphatase: 106 U/L (ref 39–117)
Anion gap: 8 (ref 5–15)
BUN: 23 mg/dL (ref 6–23)
CALCIUM: 8.2 mg/dL — AB (ref 8.4–10.5)
CO2: 30 meq/L (ref 19–32)
CREATININE: 0.71 mg/dL (ref 0.50–1.35)
Chloride: 101 mEq/L (ref 96–112)
GLUCOSE: 102 mg/dL — AB (ref 70–99)
Potassium: 3.5 mEq/L — ABNORMAL LOW (ref 3.7–5.3)
Sodium: 139 mEq/L (ref 137–147)
Total Bilirubin: 0.3 mg/dL (ref 0.3–1.2)
Total Protein: 4.8 g/dL — ABNORMAL LOW (ref 6.0–8.3)

## 2014-03-28 LAB — CULTURE, BLOOD (ROUTINE X 2)

## 2014-03-28 LAB — CULTURE, RESPIRATORY

## 2014-03-28 LAB — CBC
HCT: 24.4 % — ABNORMAL LOW (ref 39.0–52.0)
HEMOGLOBIN: 8.2 g/dL — AB (ref 13.0–17.0)
MCH: 31.3 pg (ref 26.0–34.0)
MCHC: 33.6 g/dL (ref 30.0–36.0)
MCV: 93.1 fL (ref 78.0–100.0)
Platelets: 357 10*3/uL (ref 150–400)
RBC: 2.62 MIL/uL — AB (ref 4.22–5.81)
RDW: 14.1 % (ref 11.5–15.5)
WBC: 38.3 10*3/uL — ABNORMAL HIGH (ref 4.0–10.5)

## 2014-03-28 LAB — CULTURE, RESPIRATORY W GRAM STAIN

## 2014-03-28 MED ORDER — CETYLPYRIDINIUM CHLORIDE 0.05 % MT LIQD
7.0000 mL | Freq: Two times a day (BID) | OROMUCOSAL | Status: DC
  Administered 2014-03-28 – 2014-04-01 (×7): 7 mL via OROMUCOSAL

## 2014-03-28 NOTE — Evaluation (Signed)
Physical Therapy Re-Evaluation Patient Details Name: [name] MRN: [id number] DOB: 1956-08-07 Today's Date: 03/28/2014   History of Present Illness  58 yo with rectal cancer s/p LAR/coloanal/ileostomy 03/20/2014 and exploratory lapartomy with takedown of coloanal anastomosis with partial colectomy, colostomy and VAC placement 03/24/14  Clinical Impression  Pt currently with functional limitations due to the deficits listed below (see PT Problem List).  Pt will benefit from skilled PT to increase their independence and safety with mobility to allow discharge to the venue listed below.  Pt desaturates during activity requiring increased supplemental oxygen however reports only mild SOB during ambulation.  Pt now with multiple lines/drains/leads and decreased mobility so may need SNF upon d/c however will continue to update d/c recommendations with pt progress.     Follow Up Recommendations SNF;Supervision for mobility/OOB (may progress to HHPT however SNF recommended at this time)    Equipment Recommendations  Rolling walker with 5" wheels    Recommendations for Other Services       Precautions / Restrictions Precautions Precautions: Fall Precaution Comments: drain in abdomen, colostomy, wound vac ;urinary incontinence since admission per pt; monitor sats      Mobility  Bed Mobility               General bed mobility comments: pt up in recliner on arrival  Transfers Overall transfer level: Needs assistance Equipment used: Rolling walker (2 wheeled) Transfers: Sit to/from Stand Sit to Stand: Mod assist;+2 physical assistance;+2 safety/equipment         General transfer comment: increased assist to rise, reports urinary incontinence so used urinal upon standing prior to ambulating, multiple lines/leads  Ambulation/Gait Ambulation/Gait assistance: Min assist Ambulation Distance (Feet): 90 Feet Assistive device: Rolling walker (2 wheeled) Gait Pattern/deviations:  Step-through pattern;Trunk flexed;Decreased stride length Gait velocity: decreased   General Gait Details: one seated rest break to check pulse ox, SpO2 reading 77% on 3L O2 [place] so increased to 6L with SpO2 returning to 100% after changing finger continuous pulse ox so returned to 3L however SpO2 again dropped to 77% so will need to monitor sats on oxygen when mobilizing  Stairs            Wheelchair Mobility    Modified Rankin (Stroke Patients Only)       Balance                                             Pertinent Vitals/Pain  Reports min pain at rest abdominal area and agreeable to mobilize, increased abdominal pain with mobility, activity to tolerance and RN brought pain meds after session, pt repositioned to comfort    Home Living Family/patient expects to be discharged to:: Private residence Living Arrangements: Spouse/significant other Available Help at Discharge: Family;Available 24 hours/day Type of Home: Mobile home Home Access: Stairs to enter Entrance Stairs-Rails: None Entrance Stairs-Number of Steps: 4 Home Layout: Multi-level Home Equipment: Cane - single point      Prior Function Level of Independence: Independent               Hand Dominance        Extremity/Trunk Assessment   Upper Extremity Assessment: Overall WFL for tasks assessed           Lower Extremity Assessment: Generalized weakness;LLE deficits/detail   LLE Deficits / Details: has decreased ROM and reports decreased strength and leg  is shortened from fractures in past     Communication   Communication: No difficulties  Cognition Arousal/Alertness: Awake/alert Behavior During Therapy: WFL for tasks assessed/performed Overall Cognitive Status: Within Functional Limits for tasks assessed                      General Comments      Exercises        Assessment/Plan    PT Assessment Patient needs continued PT services  PT Diagnosis  Difficulty walking;Generalized weakness   PT Problem List Decreased strength;Decreased activity tolerance;Decreased mobility;Cardiopulmonary status limiting activity;Decreased knowledge of use of DME;Pain  PT Treatment Interventions Gait training;DME instruction;Functional mobility training;Patient/family education;Therapeutic activities;Therapeutic exercise   PT Goals (Current goals can be found in the Care Plan section) Acute Rehab PT Goals PT Goal Formulation: With patient Time For Goal Achievement: 04/11/14 Potential to Achieve Goals: Good    Frequency Min 3X/week   Barriers to discharge        Co-evaluation               End of Session Equipment Utilized During Treatment: Oxygen Activity Tolerance: Patient limited by fatigue Patient left: in chair;with call bell/phone within reach;with nursing/sitter in room;with family/visitor present Nurse Communication:  (observed pt ambulating in hallway and brought pain meds)         Time: 0277-4128 PT Time Calculation (min): 42 min   Charges:   PT Evaluation $PT Re-evaluation: 1 Procedure PT Treatments $Gait Training: 23-37 mins   PT G Codes:          Jordell Outten,KATHrine E 03/28/2014, 3:22 PM Carmelia Bake, PT, DPT 03/28/2014 Pager: [phone]

## 2014-03-28 NOTE — Progress Notes (Signed)
4 Days Post-Op  Subjective: Hungry.  Some belching.  Coughing up brownish sputum.  Having trouble with urinary incontinence.  No significant abdominal pain.  Objective: Vital signs in last 24 hours: Temp:  [98.2 F (36.8 C)-98.8 F (37.1 C)] 98.2 F (36.8 C) (08/08 0400) Resp:  [20-40] 31 (08/08 0631) BP: (123-142)/(79-93) 141/83 mmHg (08/08 0631) SpO2:  [87 %-99 %] 94 % (08/08 0631) Weight:  [188 lb 7.9 oz (85.5 kg)] 188 lb 7.9 oz (85.5 kg) (08/08 0500) Last BM Date: 03/27/14 (Ileostomy, Colostomy )  Intake/Output from previous day: 08/07 0701 - 08/08 0700 In: 1376.5 [P.O.:800; I.V.:86.5; IV Piggyback:250] Out: 1440 [Urine:990; Drains:200; Stool:250] Intake/Output this shift:    PE: General- In NAD but some tachypnea at times. Lungs-coarse bilateral breath sounds Abdomen-slightly firm, loop ileostomy pink with green liquid output, colostomy pink and viable, VAC on  Lab Results:   Recent Labs  03/27/14 0521 03/28/14 0410  WBC 32.6* 38.3*  HGB 7.8* 8.2*  HCT 23.0* 24.4*  PLT 300 357   BMET  Recent Labs  03/27/14 0521 03/28/14 0410  NA 141 139  K 3.6* 3.5*  CL 104 101  CO2 27 30  GLUCOSE 100* 102*  BUN 30* 23  CREATININE 0.71 0.71  CALCIUM 8.2* 8.2*   PT/INR No results found for this basename: LABPROT, INR,  in the last 72 hours Comprehensive Metabolic Panel:    Component Value Date/Time   NA 139 03/28/2014 0410   NA 141 03/27/2014 0521   K 3.5* 03/28/2014 0410   K 3.6* 03/27/2014 0521   CL 101 03/28/2014 0410   CL 104 03/27/2014 0521   CO2 30 03/28/2014 0410   CO2 27 03/27/2014 0521   BUN 23 03/28/2014 0410   BUN 30* 03/27/2014 0521   CREATININE 0.71 03/28/2014 0410   CREATININE 0.71 03/27/2014 0521   CREATININE 0.89 12/25/2013 1221   GLUCOSE 102* 03/28/2014 0410   GLUCOSE 100* 03/27/2014 0521   CALCIUM 8.2* 03/28/2014 0410   CALCIUM 8.2* 03/27/2014 0521   AST 12 03/28/2014 0410   AST 15 01/15/2014 1300   ALT 18 03/28/2014 0410   ALT 16 01/15/2014 1300   ALKPHOS 106 03/28/2014  0410   ALKPHOS 85 01/15/2014 1300   BILITOT 0.3 03/28/2014 0410   BILITOT 0.6 01/15/2014 1300   PROT 4.8* 03/28/2014 0410   PROT 6.8 01/15/2014 1300   ALBUMIN 1.4* 03/28/2014 0410   ALBUMIN 3.4* 01/15/2014 1300     Studies/Results: Dg Chest Port 1 View  03/28/2014   CLINICAL DATA:  PICC line removed, evaluate for any remaining catheter fragment  EXAM: PORTABLE CHEST - 1 VIEW  COMPARISON:  03/27/2014  FINDINGS: Moderate to severe diffuse interstitial change stable. Heart size upper normal and stable. NG tube is been removed. PICC line has been removed. No remaining PICC line fragments identified.  IMPRESSION: No catheter fragments identified.   Electronically Signed   By: Skipper Cliche M.D.   On: 03/28/2014 01:36   Dg Chest Port 1 View  03/27/2014   CLINICAL DATA:  Evaluate infiltrates  EXAM: PORTABLE CHEST - 1 VIEW  COMPARISON:  03/25/2014  FINDINGS: Tracheal extubation. Orogastric tube continues to cross the diaphragm, tip either in the distal stomach or proximal duodenum. Unchanged positioning of left upper extremity PICC.  Borderline cardiomegaly. Unchanged upper mediastinal contours. Diffuse interstitial coarsening is unchanged. This may represent pneumonitis given previous history of aspiration. No evidence of effusion or pneumothorax.  IMPRESSION: Stable pulmonary inflation after extubation.Unchanged interstitial coarsening, likely  pneumonitis related to history of aspiration.   Electronically Signed   By: Jorje Guild M.D.   On: 03/27/2014 06:18   Dg Humerus Left  03/28/2014   CLINICAL DATA:  PICC line removed, evaluate for any remaining catheter  EXAM: LEFT HUMERUS - 2+ VIEW  COMPARISON:  None.  FINDINGS: Mild to moderate left shoulder arthritis. Humerus is normal with no fracture, dislocation, or focal osseous abnormality. No catheter fragments identified over the humerus or surrounding soft tissues.  IMPRESSION: No acute findings.  No catheter fragments identified.   Electronically Signed   By:  Skipper Cliche M.D.   On: 03/28/2014 01:34    Anti-infectives: Anti-infectives   Start     Dose/Rate Route Frequency Ordered Stop   03/27/14 0000  micafungin (MYCAMINE) 100 mg in sodium chloride 0.9 % 100 mL IVPB     100 mg 100 mL/hr over 1 Hours Intravenous Daily at bedtime 03/26/14 2356     03/25/14 0800  cefTRIAXone (ROCEPHIN) 2 g in dextrose 5 % 50 mL IVPB  Status:  Discontinued    Comments:  Pharmacy may adjust dosing strength / duration / interval for maximal efficacy   2 g 100 mL/hr over 30 Minutes Intravenous Every 24 hours 03/24/14 1655 03/24/14 2102   03/24/14 2200  piperacillin-tazobactam (ZOSYN) IVPB 3.375 g     3.375 g 12.5 mL/hr over 240 Minutes Intravenous 3 times per day 03/24/14 2133 03/31/14 1824   03/24/14 2000  metroNIDAZOLE (FLAGYL) IVPB 500 mg  Status:  Discontinued     500 mg 100 mL/hr over 60 Minutes Intravenous Every 6 hours 03/24/14 1655 03/26/14 1824   03/24/14 0730  [MAR Hold]  cefoTEtan (CEFOTAN) 2 g in dextrose 5 % 50 mL IVPB     (On MAR Hold since 03/24/14 1125)  Comments:  Pharmacy may adjust dose strength for optimal dosing.   Send with patient on call to the OR.  Anesthesia to complete antibiotic administration <11min prior to incision per [facility].   2 g 100 mL/hr over 30 Minutes Intravenous On call to O.R. 03/24/14 4270 03/24/14 1232   03/23/14 0830  cefTRIAXone (ROCEPHIN) 2 g in dextrose 5 % 50 mL IVPB  Status:  Discontinued    Comments:  Pharmacy may adjust dosing strength / duration / interval for maximal efficacy   2 g 100 mL/hr over 30 Minutes Intravenous Every 24 hours 03/23/14 0738 03/24/14 1655   03/23/14 0800  metroNIDAZOLE (FLAGYL) IVPB 500 mg  Status:  Discontinued     500 mg 100 mL/hr over 60 Minutes Intravenous Every 6 hours 03/23/14 0738 03/24/14 1655   03/20/14 2359  cefoTEtan (CEFOTAN) 2 g in dextrose 5 % 50 mL IVPB     2 g 100 mL/hr over 30 Minutes Intravenous Every 12 hours 03/20/14 1704 03/20/14 2345   03/20/14 1228   clindamycin (CLEOCIN) 900 mg, gentamicin (GARAMYCIN) 240 mg in sodium chloride 0.9 % 1,000 mL for intraperitoneal lavage  Status:  Discontinued       As needed 03/20/14 1228 03/20/14 1329   03/20/14 0518  cefoTEtan (CEFOTAN) 2 g in dextrose 5 % 50 mL IVPB     2 g 100 mL/hr over 30 Minutes Intravenous On call to O.R. 03/20/14 0518 03/20/14 1259   03/20/14 0000  clindamycin (CLEOCIN) 900 mg, gentamicin (GARAMYCIN) 240 mg in sodium chloride 0.9 % 1,000 mL for intraperitoneal lavage  Status:  Discontinued    Comments:  Pharmacy may adjust dosing strength, schedule, rate of infusion,  etc as needed to optimize therapy    Intraperitoneal To Surgery 03/19/14 1341 03/20/14 1453   03/19/14 1345  clindamycin (CLEOCIN) 900 mg, gentamicin (GARAMYCIN) 240 mg in sodium chloride 0.9 % 1,000 mL for intraperitoneal lavage  Status:  Discontinued    Comments:  Pharmacy may adjust dosing strength, schedule, rate of infusion, etc as needed to optimize therapy   1 application Intraperitoneal To Surgery 03/19/14 1339 03/19/14 1341      Assessment Principal Problem:  1. Rectal cancer, uT2uN0, s/p LAR/coloanal/ileostomy 03/20/2014 and exploratory lapartomy with takedown of coloanal anastomosis with partial colectomy, colostomy and VAC placement 03/24/14-Bowel function starting to return.   2.  Aspiration pneumonia-on IV abxs; having intermittent tachypnea; diffuse interstitial lung changes on CXR.   3.  Fungemia-on Micafugin       LOS: 8 days   Plan: Advance to full liquids.  Keep in SDU as I feel that his pulmonary status is somewhat tenuous.  Recheck CBC and CXR tomorrow.   Jamaine Quintin J 03/28/2014

## 2014-03-28 NOTE — Progress Notes (Signed)
IV team called to notify them of order to dc picc line and culture tip.  IV RN dc'd the line and there was a question about the length of the line.  It had been charted as 49cm and she removed 46cm.  IV RN measured pt and felt 49cm would have been appropriate.  We were unable to reach RN who documented 49cm.  Per protocol an xray was ordered, Dr. Earnest Conroy at [facility] was notified and radiologist read films.  No fragments of picc line were found.  Tip was sent to lab for culture as ordered. Fara Olden P

## 2014-03-29 ENCOUNTER — Inpatient Hospital Stay (HOSPITAL_COMMUNITY): Payer: Medicaid Other

## 2014-03-29 LAB — CBC
HCT: 23.7 % — ABNORMAL LOW (ref 39.0–52.0)
Hemoglobin: 7.9 g/dL — ABNORMAL LOW (ref 13.0–17.0)
MCH: 31.3 pg (ref 26.0–34.0)
MCHC: 33.3 g/dL (ref 30.0–36.0)
MCV: 94 fL (ref 78.0–100.0)
PLATELETS: 442 10*3/uL — AB (ref 150–400)
RBC: 2.52 MIL/uL — AB (ref 4.22–5.81)
RDW: 14.1 % (ref 11.5–15.5)
WBC: 34.3 10*3/uL — AB (ref 4.0–10.5)

## 2014-03-29 MED ORDER — BLISTEX MEDICATED EX OINT
TOPICAL_OINTMENT | CUTANEOUS | Status: DC | PRN
  Filled 2014-03-29: qty 10

## 2014-03-29 MED ORDER — ACETAMINOPHEN 160 MG/5ML PO SOLN: 1000.0000 mg | ORAL | Status: DC | PRN

## 2014-03-29 MED ORDER — LIP MEDEX EX OINT: 1.0000 "application " | TOPICAL_OINTMENT | CUTANEOUS | Status: DC | PRN

## 2014-03-29 NOTE — Progress Notes (Signed)
5 Days Post-Op  Subjective: Still coughing up quite a bit of brownish sputum.  Having some episodes of dyspnea.  No nausea or vomiting.  Minimal abdominal pain.  Able to control his urine a little better.  Objective: Vital signs in last 24 hours: Temp:  [98 F (36.7 C)-100 F (37.8 C)] 99 F (37.2 C) (08/09 0400) Resp:  [19-31] 25 (08/09 0400) BP: (112-145)/(77-92) 132/89 mmHg (08/09 0400) SpO2:  [95 %-98 %] 96 % (08/09 0400) Weight:  [187 lb 6.3 oz (85 kg)] 187 lb 6.3 oz (85 kg) (08/09 0400) Last BM Date: 03/28/14  Intake/Output from previous day: 08/08 0701 - 08/09 0700 In: 940 [P.O.:120; I.V.:120; IV Piggyback:250] Out: 1870 [Urine:1125; Drains:275; Stool:470] Intake/Output this shift:    PE: General- In NAD  Lungs-coarse bilateral breath sounds remain Abdomen- softer, loop ileostomy pink with green liquid output, colostomy pink and viable, VAC on  Lab Results:   Recent Labs  03/28/14 0410 03/29/14 0345  WBC 38.3* 34.3*  HGB 8.2* 7.9*  HCT 24.4* 23.7*  PLT 357 442*   BMET  Recent Labs  03/27/14 0521 03/28/14 0410  NA 141 139  K 3.6* 3.5*  CL 104 101  CO2 27 30  GLUCOSE 100* 102*  BUN 30* 23  CREATININE 0.71 0.71  CALCIUM 8.2* 8.2*   PT/INR No results found for this basename: LABPROT, INR,  in the last 72 hours Comprehensive Metabolic Panel:    Component Value Date/Time   NA 139 03/28/2014 0410   NA 141 03/27/2014 0521   K 3.5* 03/28/2014 0410   K 3.6* 03/27/2014 0521   CL 101 03/28/2014 0410   CL 104 03/27/2014 0521   CO2 30 03/28/2014 0410   CO2 27 03/27/2014 0521   BUN 23 03/28/2014 0410   BUN 30* 03/27/2014 0521   CREATININE 0.71 03/28/2014 0410   CREATININE 0.71 03/27/2014 0521   CREATININE 0.89 12/25/2013 1221   GLUCOSE 102* 03/28/2014 0410   GLUCOSE 100* 03/27/2014 0521   CALCIUM 8.2* 03/28/2014 0410   CALCIUM 8.2* 03/27/2014 0521   AST 12 03/28/2014 0410   AST 15 01/15/2014 1300   ALT 18 03/28/2014 0410   ALT 16 01/15/2014 1300   ALKPHOS 106 03/28/2014 0410   ALKPHOS  85 01/15/2014 1300   BILITOT 0.3 03/28/2014 0410   BILITOT 0.6 01/15/2014 1300   PROT 4.8* 03/28/2014 0410   PROT 6.8 01/15/2014 1300   ALBUMIN 1.4* 03/28/2014 0410   ALBUMIN 3.4* 01/15/2014 1300     Studies/Results: Dg Chest Port 1 View  03/29/2014   CLINICAL DATA:  58 year old male with aspiration pneumonia  EXAM: PORTABLE CHEST - 1 VIEW  COMPARISON:  03/28/2014 and prior chest radiographs dating back to 12/29/2013  FINDINGS: Cardiomediastinal silhouette is unchanged.  Bilateral airspace opacities are again noted without significant change.  There is no evidence of pleural effusion or pneumothorax.  No acute bony abnormalities are identified.  IMPRESSION: Unchanged bilateral airspace opacities from 03/28/2014.   Electronically Signed   By: Hassan Rowan M.D.   On: 03/29/2014 07:10   Dg Chest Port 1 View  03/28/2014   CLINICAL DATA:  PICC line removed, evaluate for any remaining catheter fragment  EXAM: PORTABLE CHEST - 1 VIEW  COMPARISON:  03/27/2014  FINDINGS: Moderate to severe diffuse interstitial change stable. Heart size upper normal and stable. NG tube is been removed. PICC line has been removed. No remaining PICC line fragments identified.  IMPRESSION: No catheter fragments identified.   Electronically Signed  By: Skipper Cliche M.D.   On: 03/28/2014 01:36   Dg Humerus Left  03/28/2014   CLINICAL DATA:  PICC line removed, evaluate for any remaining catheter  EXAM: LEFT HUMERUS - 2+ VIEW  COMPARISON:  None.  FINDINGS: Mild to moderate left shoulder arthritis. Humerus is normal with no fracture, dislocation, or focal osseous abnormality. No catheter fragments identified over the humerus or surrounding soft tissues.  IMPRESSION: No acute findings.  No catheter fragments identified.   Electronically Signed   By: Skipper Cliche M.D.   On: 03/28/2014 01:34    Anti-infectives: Anti-infectives   Start     Dose/Rate Route Frequency Ordered Stop   03/27/14 0000  micafungin (MYCAMINE) 100 mg in sodium  chloride 0.9 % 100 mL IVPB     100 mg 100 mL/hr over 1 Hours Intravenous Daily at bedtime 03/26/14 2356     03/25/14 0800  cefTRIAXone (ROCEPHIN) 2 g in dextrose 5 % 50 mL IVPB  Status:  Discontinued    Comments:  Pharmacy may adjust dosing strength / duration / interval for maximal efficacy   2 g 100 mL/hr over 30 Minutes Intravenous Every 24 hours 03/24/14 1655 03/24/14 2102   03/24/14 2200  piperacillin-tazobactam (ZOSYN) IVPB 3.375 g     3.375 g 12.5 mL/hr over 240 Minutes Intravenous 3 times per day 03/24/14 2133 03/31/14 1824   03/24/14 2000  metroNIDAZOLE (FLAGYL) IVPB 500 mg  Status:  Discontinued     500 mg 100 mL/hr over 60 Minutes Intravenous Every 6 hours 03/24/14 1655 03/26/14 1824   03/24/14 0730  [MAR Hold]  cefoTEtan (CEFOTAN) 2 g in dextrose 5 % 50 mL IVPB     (On MAR Hold since 03/24/14 1125)  Comments:  Pharmacy may adjust dose strength for optimal dosing.   Send with patient on call to the OR.  Anesthesia to complete antibiotic administration <15min prior to incision per [facility].   2 g 100 mL/hr over 30 Minutes Intravenous On call to O.R. 03/24/14 9562 03/24/14 1232   03/23/14 0830  cefTRIAXone (ROCEPHIN) 2 g in dextrose 5 % 50 mL IVPB  Status:  Discontinued    Comments:  Pharmacy may adjust dosing strength / duration / interval for maximal efficacy   2 g 100 mL/hr over 30 Minutes Intravenous Every 24 hours 03/23/14 0738 03/24/14 1655   03/23/14 0800  metroNIDAZOLE (FLAGYL) IVPB 500 mg  Status:  Discontinued     500 mg 100 mL/hr over 60 Minutes Intravenous Every 6 hours 03/23/14 0738 03/24/14 1655   03/20/14 2359  cefoTEtan (CEFOTAN) 2 g in dextrose 5 % 50 mL IVPB     2 g 100 mL/hr over 30 Minutes Intravenous Every 12 hours 03/20/14 1704 03/20/14 2345   03/20/14 1228  clindamycin (CLEOCIN) 900 mg, gentamicin (GARAMYCIN) 240 mg in sodium chloride 0.9 % 1,000 mL for intraperitoneal lavage  Status:  Discontinued       As needed 03/20/14 1228 03/20/14 1329    03/20/14 0518  cefoTEtan (CEFOTAN) 2 g in dextrose 5 % 50 mL IVPB     2 g 100 mL/hr over 30 Minutes Intravenous On call to O.R. 03/20/14 0518 03/20/14 1259   03/20/14 0000  clindamycin (CLEOCIN) 900 mg, gentamicin (GARAMYCIN) 240 mg in sodium chloride 0.9 % 1,000 mL for intraperitoneal lavage  Status:  Discontinued    Comments:  Pharmacy may adjust dosing strength, schedule, rate of infusion, etc as needed to optimize therapy    Intraperitoneal To Surgery  03/19/14 1341 03/20/14 1453   03/19/14 1345  clindamycin (CLEOCIN) 900 mg, gentamicin (GARAMYCIN) 240 mg in sodium chloride 0.9 % 1,000 mL for intraperitoneal lavage  Status:  Discontinued    Comments:  Pharmacy may adjust dosing strength, schedule, rate of infusion, etc as needed to optimize therapy   1 application Intraperitoneal To Surgery 03/19/14 1339 03/19/14 1341      Assessment Principal Problem:  1. Rectal cancer, uT2uN0, s/p LAR/coloanal/ileostomy 03/20/2014 and exploratory lapartomy with takedown of coloanal anastomosis with partial colectomy, colostomy and VAC placement 03/24/14-tolerating full liquid diet.   2.  Aspiration pneumonia-on IV abxs; having intermittent tachypnea; diffuse interstitial lung changes on CXR c/w ARDS; some with some dyspnea and slight tachypnea at times; WBC down to 34k today   3.  Fungemia-on Micafugin   4.  ABL anemia-hgb 7,9       LOS: 9 days   Plan:   Advance to soft diet.  Continue abxs.  Continue PT.  Hopefully will be able to transfer out of SDU tomorrow.   [name] J 03/29/2014

## 2014-03-30 ENCOUNTER — Encounter (HOSPITAL_COMMUNITY): Payer: Self-pay | Admitting: Radiology

## 2014-03-30 ENCOUNTER — Encounter: Payer: Self-pay | Admitting: Gastroenterology

## 2014-03-30 ENCOUNTER — Inpatient Hospital Stay (HOSPITAL_COMMUNITY): Payer: Medicaid Other

## 2014-03-30 LAB — CATH TIP CULTURE: Culture: NO GROWTH

## 2014-03-30 MED ORDER — IOHEXOL 300 MG/ML  SOLN
50.0000 mL | Freq: Once | INTRAMUSCULAR | Status: AC | PRN
  Administered 2014-03-30: 50 mL via ORAL

## 2014-03-30 MED ORDER — BISMUTH SUBSALICYLATE 262 MG/15ML PO SUSP
30.0000 mL | Freq: Three times a day (TID) | ORAL | Status: DC | PRN
  Filled 2014-03-30: qty 236

## 2014-03-30 MED ORDER — ENSURE COMPLETE PO LIQD
237.0000 mL | Freq: Two times a day (BID) | ORAL | Status: DC
  Administered 2014-03-31: 237 mL via ORAL

## 2014-03-30 MED ORDER — ASPIRIN EC 81 MG PO TBEC
81.0000 mg | DELAYED_RELEASE_TABLET | Freq: Every day | ORAL | Status: DC
  Administered 2014-03-30 – 2014-04-02 (×4): 81 mg via ORAL
  Filled 2014-03-30 (×4): qty 1

## 2014-03-30 MED ORDER — ONDANSETRON HCL 4 MG/2ML IJ SOLN: 4.0000 mg | Freq: Four times a day (QID) | INTRAMUSCULAR | Status: DC | PRN

## 2014-03-30 MED ORDER — FENTANYL CITRATE 0.05 MG/ML IJ SOLN: 50.0000 ug | INTRAMUSCULAR | Status: DC | PRN

## 2014-03-30 MED ORDER — ACETAMINOPHEN 160 MG/5ML PO SOLN
1000.0000 mg | Freq: Three times a day (TID) | ORAL | Status: DC
  Filled 2014-03-30 (×3): qty 40

## 2014-03-30 MED ORDER — SACCHAROMYCES BOULARDII 250 MG PO CAPS
250.0000 mg | ORAL_CAPSULE | Freq: Two times a day (BID) | ORAL | Status: DC
  Administered 2014-03-30 – 2014-04-02 (×7): 250 mg via ORAL
  Filled 2014-03-30 (×8): qty 1

## 2014-03-30 MED ORDER — OXYCODONE HCL 5 MG PO TABS
5.0000 mg | ORAL_TABLET | ORAL | Status: DC | PRN
  Administered 2014-03-30 – 2014-04-02 (×7): 10 mg via ORAL
  Filled 2014-03-30 (×7): qty 2

## 2014-03-30 MED ORDER — FUROSEMIDE 40 MG PO TABS
40.0000 mg | ORAL_TABLET | Freq: Two times a day (BID) | ORAL | Status: AC
  Administered 2014-03-30 – 2014-03-31 (×3): 40 mg via ORAL
  Filled 2014-03-30 (×3): qty 1

## 2014-03-30 MED ORDER — SODIUM CHLORIDE 0.9 % IJ SOLN: 3.0000 mL | INTRAMUSCULAR | Status: DC | PRN

## 2014-03-30 MED ORDER — LACTATED RINGERS IV BOLUS (SEPSIS): 1000.0000 mL | Freq: Three times a day (TID) | INTRAVENOUS | Status: AC | PRN

## 2014-03-30 MED ORDER — TAB-A-VITE/IRON PO TABS
1.0000 | ORAL_TABLET | Freq: Every day | ORAL | Status: DC
  Administered 2014-03-30 – 2014-04-02 (×4): 1 via ORAL
  Filled 2014-03-30 (×4): qty 1

## 2014-03-30 MED ORDER — ACETAMINOPHEN 160 MG/5ML PO SOLN
1000.0000 mg | Freq: Three times a day (TID) | ORAL | Status: DC
  Administered 2014-03-30 – 2014-04-02 (×10): 1000 mg via ORAL
  Filled 2014-03-30 (×11): qty 40.6

## 2014-03-30 MED ORDER — VITAMIN C 500 MG PO TABS
500.0000 mg | ORAL_TABLET | Freq: Two times a day (BID) | ORAL | Status: DC
  Administered 2014-03-30 – 2014-04-02 (×7): 500 mg via ORAL
  Filled 2014-03-30 (×8): qty 1

## 2014-03-30 MED ORDER — SODIUM CHLORIDE 0.9 % IV SOLN: 250.0000 mL | INTRAVENOUS | Status: DC | PRN

## 2014-03-30 MED ORDER — CHLORHEXIDINE GLUCONATE 0.12 % MT SOLN
15.0000 mL | Freq: Two times a day (BID) | OROMUCOSAL | Status: DC
  Administered 2014-03-30 – 2014-04-02 (×7): 15 mL via OROMUCOSAL
  Filled 2014-03-30 (×9): qty 15

## 2014-03-30 MED ORDER — SODIUM CHLORIDE 0.9 % IJ SOLN
3.0000 mL | INTRAMUSCULAR | Status: DC | PRN
Start: 2014-03-30 — End: 2014-04-02

## 2014-03-30 MED ORDER — CHLORHEXIDINE GLUCONATE 0.12 % MT SOLN
OROMUCOSAL | Status: AC
  Administered 2014-03-30: 15 mL
  Filled 2014-03-30: qty 15

## 2014-03-30 MED ORDER — SODIUM CHLORIDE 0.9 % IJ SOLN: 3.0000 mL | Freq: Two times a day (BID) | INTRAMUSCULAR | Status: DC

## 2014-03-30 MED ORDER — IOHEXOL 300 MG/ML  SOLN
100.0000 mL | Freq: Once | INTRAMUSCULAR | Status: AC | PRN
Start: 2014-03-30 — End: 2014-03-30
  Administered 2014-03-30: 100 mL via INTRAVENOUS

## 2014-03-30 MED ORDER — FLUCONAZOLE IN SODIUM CHLORIDE 400-0.9 MG/200ML-% IV SOLN
400.0000 mg | INTRAVENOUS | Status: DC
  Administered 2014-03-30 – 2014-04-01 (×3): 400 mg via INTRAVENOUS
  Filled 2014-03-30 (×3): qty 200

## 2014-03-30 NOTE — Progress Notes (Signed)
NUTRITION FOLLOW UP  Intervention:   - Assisted pt with ordering lunch - Ensure Complete BID - Reviewed diet therapy for new colostomy and encouraged small frequent low fiber meals. Handouts provided with RD contact information.  - RD to continue to monitor   NUTRITION DIAGNOSIS:  Inadequate oral intake related to inability to eat as evidenced by NPO - ongoing but now related to early satiety as evidenced by pt report   Goal:  Advance diet as tolerated to soft diet - met  New goal: Pt to consume >90% of meals/supplements   Monitor:   Weights, labs, intake, colostomy output   Assessment:   Pt with very distal T2 rectal CA, admitted for resection with diverting loop ileostomy. Had elevated heart rate night of 8/3. Ate 3 meals on 8/3 with good appetite per MD but had nausea/vomiting morning of 8/4. Was intubated yesterday afternoon and was noted to have a large amount of GI contents that emerged from pt's mouth and nose prior to intubation, with concern for aspiration. Had exploratory laparotomy, distal colectomy and end colostomy with placement of wound VAC yesterday. Was found to have ischemia of colonanal anastomosis. Per [facility] notes, concern is for development of ARDS.   8/5: - Pt awake on vent  - Wife at bedside and reports pt with excellent appetite PTA, was eating 3 big meals/day with no changes in weight  - Has NGT in place with 2.7L output total yesterday   8/6: - Extubated yesterday, remains NPO - Has NGT clamped - Met with pt who denies any nausea, eager to have [employer] popsicle  - Said he was eating "like a horse" PTA - Pt alone in room  - Pt with 228m output from ostomy so far today  8/10: - NGT d/c 8/7 - Diet advanced to soft yesterday - Ate 100% of breakfast - C/o being full from sausage he ate at breakfast and had not yet ordered lunch - Had 258mcolostomy output charted yesterday    Height: Ht Readings from Last 1 Encounters:  03/23/14 _0  (1.651 m)     Weight Status:   Wt Readings from Last 1 Encounters:  03/30/14 186 lb 4.6 oz (84.5 kg)  Admit wt         176 lb (79.8 kg) Net I/Os: +4.4L  Re-estimated needs:  Kcal: 1600-1800 Protein: 70-90 Fluid: per MD  Skin: +1 Generalized edema, Colostomy   Diet Order: BlCriss Rosales Intake/Output Summary (Last 24 hours) at 03/30/14 1326 Last data filed at 03/30/14 1000  Gross per 24 hour  Intake    660 ml  Output   1175 ml  Net   -515 ml    Last BM: 8/9   Labs:   Recent Labs Lab 03/26/14 0600 03/27/14 0521 03/28/14 0410  NA 137 141 139  K 4.0 3.6* 3.5*  CL 101 104 101  CO2 _1 BUN 27* 30* 23  CREATININE 0.75 0.71 0.71  CALCIUM 7.8* 8.2* 8.2*  GLUCOSE 97 100* 102*    CBG (last 3)  No results found for this basename: GLUCAP,  in the last 72 hours  Scheduled Meds: . antiseptic oral rinse  7 mL Mouth Rinse BID  . chlorhexidine  15 mL Mouth Rinse BID  . heparin subcutaneous  5,000 Units Subcutaneous 3 times per day  . lactated ringers  1,000 mL Intravenous Once  . lip balm  1 application Topical BID  . micafungin (MYCAMINE) IV  100 mg Intravenous QHS  .  pantoprazole  40 mg Oral BID  . piperacillin-tazobactam (ZOSYN)  IV  3.375 g Intravenous 3 times per day  . saccharomyces boulardii  250 mg Oral BID    Continuous Infusions: . sodium chloride 1,000 mL (03/27/14 1639)     Carlis Stable MS, RD, LDN [phone] Pager [phone] Weekend/After Hours Pager

## 2014-03-30 NOTE — Progress Notes (Signed)
Physical Therapy Treatment Patient Details Name: [name] MRN: [id number] DOB: 1955-12-04 Today's Date: Apr 12, 2014    History of Present Illness 58 yo with rectal cancer s/p LAR/coloanal/ileostomy 03/20/2014 and exploratory lapartomy with takedown of coloanal anastomosis with partial colectomy, colostomy and VAC placement 03/24/14    PT Comments    Pt progressing; motivated to be able to do more;   Follow Up Recommendations  SNF;Supervision for mobility/OOB     Equipment Recommendations  Rolling walker with 5" wheels    Recommendations for Other Services       Precautions / Restrictions Precautions Precautions: Fall Precaution Comments: colostomy, wound VAC    Mobility  Bed Mobility               General bed mobility comments: pt up in recliner on arrival  Transfers Overall transfer level: Needs assistance Equipment used: Rolling walker (2 wheeled) Transfers: Sit to/from Stand Sit to Stand: Min assist         General transfer comment: verbal cues for hand placement  Ambulation/Gait Ambulation/Gait assistance: +2 safety/equipment;Min assist Ambulation Distance (Feet): 40 Feet Assistive device: Rolling walker (2 wheeled) Gait Pattern/deviations: Step-through pattern;Decreased stride length;Wide base of support Gait velocity: decreased Gait velocity interpretation: Below normal speed for age/gender General Gait Details: one standing rest d/t DOE,  recovered well and able to proceed;   Financial trader Rankin (Stroke Patients Only)       Balance                                    Cognition Arousal/Alertness: Awake/alert Behavior During Therapy: WFL for tasks assessed/performed Overall Cognitive Status: Within Functional Limits for tasks assessed                      Exercises      General Comments        Pertinent Vitals/Pain Pain Assessment: 0-10 Pain Score: 4  Pain  Location: abdomen Pain Intervention(s): Limited activity within patient's tolerance;Monitored during session  O2 sats on 2L: Resting--exertion---after 95%-----87-89%----95%  HR: 98---124----101    Home Living                      Prior Function            PT Goals (current goals can now be found in the care plan section) Acute Rehab PT Goals Patient Stated Goal: I want to walk Time For Goal Achievement: 04/11/14 Potential to Achieve Goals: Good Progress towards PT goals: Progressing toward goals    Frequency  Min 3X/week    PT Plan Current plan remains appropriate    Co-evaluation             End of Session Equipment Utilized During Treatment: Oxygen Activity Tolerance: Patient limited by fatigue (activity tolerance improving) Patient left: in chair;with call bell/phone within reach     Time: 1350-1410 PT Time Calculation (min): 20 min  Charges:  $Gait Training: 8-22 mins                    G Codes:      [name] Apr 12, 2014, 3:11 PM

## 2014-03-30 NOTE — Progress Notes (Addendum)
[facility]   Day # 7 zosyn Day # 5 micafungin   Subjective: Visual floaters left eye   Antibiotics:  Anti-infectives   Start     Dose/Rate Route Frequency Ordered Stop   03/27/14 0000  micafungin (MYCAMINE) 100 mg in sodium chloride 0.9 % 100 mL IVPB     100 mg 100 mL/hr over 1 Hours Intravenous Daily at bedtime 03/26/14 2356     03/25/14 0800  cefTRIAXone (ROCEPHIN) 2 g in dextrose 5 % 50 mL IVPB  Status:  Discontinued    Comments:  Pharmacy may adjust dosing strength / duration / interval for maximal efficacy   2 g 100 mL/hr over 30 Minutes Intravenous Every 24 hours 03/24/14 1655 03/24/14 2102   03/24/14 2200  piperacillin-tazobactam (ZOSYN) IVPB 3.375 g     3.375 g 12.5 mL/hr over 240 Minutes Intravenous 3 times per day 03/24/14 2133 03/31/14 1824   03/24/14 2000  metroNIDAZOLE (FLAGYL) IVPB 500 mg  Status:  Discontinued     500 mg 100 mL/hr over 60 Minutes Intravenous Every 6 hours 03/24/14 1655 03/26/14 1824   03/24/14 0730  [MAR Hold]  cefoTEtan (CEFOTAN) 2 g in dextrose 5 % 50 mL IVPB     (On MAR Hold since 03/24/14 1125)  Comments:  Pharmacy may adjust dose strength for optimal dosing.   Send with patient on call to the OR.  Anesthesia to complete antibiotic administration <9min prior to incision per [facility].   2 g 100 mL/hr over 30 Minutes Intravenous On call to O.R. 03/24/14 3557 03/24/14 1232   03/23/14 0830  cefTRIAXone (ROCEPHIN) 2 g in dextrose 5 % 50 mL IVPB  Status:  Discontinued    Comments:  Pharmacy may adjust dosing strength / duration / interval for maximal efficacy   2 g 100 mL/hr over 30 Minutes Intravenous Every 24 hours 03/23/14 0738 03/24/14 1655   03/23/14 0800  metroNIDAZOLE (FLAGYL) IVPB 500 mg  Status:  Discontinued     500 mg 100 mL/hr over 60 Minutes Intravenous Every 6 hours 03/23/14 0738 03/24/14 1655   03/20/14 2359  cefoTEtan (CEFOTAN) 2 g in dextrose 5 % 50 mL IVPB     2 g 100 mL/hr over 30 Minutes  Intravenous Every 12 hours 03/20/14 1704 03/20/14 2345   03/20/14 1228  clindamycin (CLEOCIN) 900 mg, gentamicin (GARAMYCIN) 240 mg in sodium chloride 0.9 % 1,000 mL for intraperitoneal lavage  Status:  Discontinued       As needed 03/20/14 1228 03/20/14 1329   03/20/14 0518  cefoTEtan (CEFOTAN) 2 g in dextrose 5 % 50 mL IVPB     2 g 100 mL/hr over 30 Minutes Intravenous On call to O.R. 03/20/14 0518 03/20/14 1259   03/20/14 0000  clindamycin (CLEOCIN) 900 mg, gentamicin (GARAMYCIN) 240 mg in sodium chloride 0.9 % 1,000 mL for intraperitoneal lavage  Status:  Discontinued    Comments:  Pharmacy may adjust dosing strength, schedule, rate of infusion, etc as needed to optimize therapy    Intraperitoneal To Surgery 03/19/14 1341 03/20/14 1453   03/19/14 1345  clindamycin (CLEOCIN) 900 mg, gentamicin (GARAMYCIN) 240 mg in sodium chloride 0.9 % 1,000 mL for intraperitoneal lavage  Status:  Discontinued    Comments:  Pharmacy may adjust dosing strength, schedule, rate of infusion, etc as needed to optimize therapy   1 application Intraperitoneal To Surgery 03/19/14 1339 03/19/14 1341      Medications: Scheduled Meds: . acetaminophen (TYLENOL) oral  liquid 160 mg/5 mL  1,000 mg Oral TID WC & HS  . antiseptic oral rinse  7 mL Mouth Rinse BID  . aspirin EC  81 mg Oral Daily  . chlorhexidine  15 mL Mouth Rinse BID  . feeding supplement (ENSURE COMPLETE)  237 mL Oral BID BM  . furosemide  40 mg Oral BID  . heparin subcutaneous  5,000 Units Subcutaneous 3 times per day  . lactated ringers  1,000 mL Intravenous Once  . lip balm  1 application Topical BID  . micafungin (MYCAMINE) IV  100 mg Intravenous QHS  . multivitamins with iron  1 tablet Oral Daily  . pantoprazole  40 mg Oral BID  . piperacillin-tazobactam (ZOSYN)  IV  3.375 g Intravenous 3 times per day  . saccharomyces boulardii  250 mg Oral BID  . sodium chloride  3 mL Intravenous Q12H  . sodium chloride  3 mL Intravenous Q12H  . vitamin C   500 mg Oral BID   Continuous Infusions:  PRN Meds:.sodium chloride, bismuth subsalicylate, diphenhydrAMINE, fentaNYL, lactated ringers, lip balm, methocarbamol (ROBAXIN) IV, ondansetron (ZOFRAN) IV, ondansetron (ZOFRAN) IV, oxyCODONE, phenol, promethazine, sodium chloride, sodium chloride, sodium chloride, sodium chloride    Objective: Weight change: -1 lb 1.6 oz (-0.5 kg)  Intake/Output Summary (Last 24 hours) at 03/30/14 1513 Last data filed at 03/30/14 1000  Gross per 24 hour  Intake    590 ml  Output   1175 ml  Net   -585 ml   Blood pressure 122/90, pulse 93, temperature 98.6 F (37 C), temperature source Oral, resp. rate 26, height 5\' 5"  (1.651 m), weight 186 lb 4.6 oz (84.5 kg), SpO2 97.00%. Temp:  [98.4 F (36.9 C)-99.6 F (37.6 C)] 98.6 F (37 C) (08/10 1148) Pulse Rate:  [93] 93 (08/10 1148) Resp:  [22-39] 26 (08/10 1148) BP: (117-138)/(65-97) 122/90 mmHg (08/10 1148) SpO2:  [97 %-98 %] 97 % (08/10 1148) Weight:  [186 lb 4.6 oz (84.5 kg)] 186 lb 4.6 oz (84.5 kg) (08/10 0400)  Physical Exam: General: Alert and awake, oriented x3, not in any acute distress. HEENT: anicteric sclera, pupils reactive to light and accommodation, EOMI CVS regular rate, normal r,  no murmur rubs or gallops Chest: clear to auscultation bilaterally, no wheezing, rales or rhonchi Abdomen: ostomy sites are clean Extremities: no  clubbing or edema noted bilaterally Neuro: nonfocal  CBC:  CBC Latest Ref Rng 03/29/2014 03/28/2014 03/27/2014  WBC 4.0 - 10.5 K/uL 34.3(H) 38.3(H) 32.6(H)  Hemoglobin 13.0 - 17.0 g/dL 7.9(L) 8.2(L) 7.8(L)  Hematocrit 39.0 - 52.0 % 23.7(L) 24.4(L) 23.0(L)  Platelets 150 - 400 K/uL 442(H) 357 300        BMET  Recent Labs  03/28/14 0410  NA 139  K 3.5*  CL 101  CO2 30  GLUCOSE 102*  BUN 23  CREATININE 0.71  CALCIUM 8.2*     Liver Panel   Recent Labs  03/28/14 0410  PROT 4.8*  ALBUMIN 1.4*  AST 12  ALT 18  ALKPHOS 106  BILITOT 0.3        Sedimentation Rate No results found for this basename: ESRSEDRATE,  in the last 72 hours C-Reactive Protein No results found for this basename: CRP,  in the last 72 hours  Micro Results: Recent Results (from the past 720 hour(s))  MRSA PCR SCREENING     Status: None   Collection Time    03/24/14  5:00 PM      Result Value Ref Range  Status   MRSA by PCR NEGATIVE  NEGATIVE Final   Comment:            The GeneXpert MRSA Assay (FDA     approved for NASAL specimens     only), is one component of a     comprehensive MRSA colonization     surveillance program. It is not     intended to diagnose MRSA     infection nor to guide or     monitor treatment for     MRSA infections.  CULTURE, BLOOD (ROUTINE X 2)     Status: None   Collection Time    03/24/14  9:50 PM      Result Value Ref Range Status   Specimen Description BLOOD R FOOT   Final   Special Requests BOTTLES DRAWN AEROBIC AND ANAEROBIC 5 CC EACH   Final   Culture  Setup Time     Final   Value: 03/25/2014 00:20     Performed at [facility]   Culture     Final   Value: CANDIDA ALBICANS     Note: Gram Stain Report Called to,Read Back By and Verified With: Darrin Nipper ON 03/26/2014 AT 8:00P BY WILEJ     Performed at [facility]   Report Status 03/28/2014 FINAL   Final  CULTURE, BLOOD (ROUTINE X 2)     Status: None   Collection Time    03/24/14 10:00 PM      Result Value Ref Range Status   Specimen Description BLOOD L FOOT   Final   Special Requests BOTTLES DRAWN AEROBIC AND ANAEROBIC 5 CC EACH   Final   Culture  Setup Time     Final   Value: 03/25/2014 00:20     Performed at [facility]   Culture     Final   Value:        BLOOD CULTURE RECEIVED NO GROWTH TO DATE CULTURE WILL BE HELD FOR 5 DAYS BEFORE ISSUING A FINAL NEGATIVE REPORT     Performed at [facility]   Report Status PENDING   Incomplete  CULTURE, RESPIRATORY (NON-EXPECTORATED)     Status: None   Collection Time     03/25/14  9:20 AM      Result Value Ref Range Status   Specimen Description ENDOTRACHEAL   Final   Special Requests NONE   Final   Gram Stain     Final   Value: FEW WBC PRESENT, PREDOMINANTLY PMN     NO SQUAMOUS EPITHELIAL CELLS SEEN     FEW YEAST     Performed at [facility]   Culture     Final   Value: FEW PSEUDOMONAS AERUGINOSA     ABUNDANT CANDIDA ALBICANS     Performed at [facility]   Report Status 03/28/2014 FINAL   Final   Organism ID, Bacteria PSEUDOMONAS AERUGINOSA   Final  CULTURE, BLOOD (ROUTINE X 2)     Status: None   Collection Time    03/27/14  9:50 AM      Result Value Ref Range Status   Specimen Description BLOOD R HAND   Final   Special Requests BOTTLES DRAWN AEROBIC AND ANAEROBIC 5CC   Final   Culture  Setup Time     Final   Value: 03/27/2014 12:42     Performed at [facility]   Culture     Final   Value:  BLOOD CULTURE RECEIVED NO GROWTH TO DATE CULTURE WILL BE HELD FOR 5 DAYS BEFORE ISSUING A FINAL NEGATIVE REPORT     Performed at [facility]   Report Status PENDING   Incomplete  CULTURE, BLOOD (ROUTINE X 2)     Status: None   Collection Time    03/27/14  9:53 AM      Result Value Ref Range Status   Specimen Description BLOOD R HAND   Final   Special Requests BOTTLES DRAWN AEROBIC ONLY 6CC   Final   Culture  Setup Time     Final   Value: 03/27/2014 12:42     Performed at [facility]   Culture     Final   Value:        BLOOD CULTURE RECEIVED NO GROWTH TO DATE CULTURE WILL BE HELD FOR 5 DAYS BEFORE ISSUING A FINAL NEGATIVE REPORT     Performed at [facility]   Report Status PENDING   Incomplete  CATH TIP CULTURE     Status: None   Collection Time    03/28/14 12:31 AM      Result Value Ref Range Status   Specimen Description CATH TIP   Final   Special Requests NONE   Final   Culture     Final   Value: NO GROWTH 2 DAYS     Performed at [facility]   Report Status 03/30/2014  FINAL   Final    Studies/Results: Dg Chest Port 1 View  03/29/2014   CLINICAL DATA:  58 year old male with aspiration pneumonia  EXAM: PORTABLE CHEST - 1 VIEW  COMPARISON:  03/28/2014 and prior chest radiographs dating back to 12/29/2013  FINDINGS: Cardiomediastinal silhouette is unchanged.  Bilateral airspace opacities are again noted without significant change.  There is no evidence of pleural effusion or pneumothorax.  No acute bony abnormalities are identified.  IMPRESSION: Unchanged bilateral airspace opacities from 03/28/2014.   Electronically Signed   By: Hassan Rowan M.D.   On: 03/29/2014 07:10      Assessment/Plan:  Principal Problem:   Rectal cancer, uT2uN0, s/p LAR/coloanal/ileostomy 03/20/2014 Active Problems:   Tobacco abuse, in remission   GERD (gastroesophageal reflux disease)   Obesity (BMI 30-39.9)   ards   Aspiration pneumonia    [name] is a 58 y.o. male with 58 y.o. male with rectal cancer sp robotic assisted low anterior resection and diverting loop ileostomy on 03/20/14. Complicated by ischemia and necorsis of colonanal anastomosis sp Takedown of coloanal anastomosis Distal colectomy & end colostomy Wound vac placement with respiratory failure sp reintubation, now found to have candidemia he also now has some new floaters in his left visual field.  #1 Candidemia:   --Given this is a Candida albicans we can switch him to high-dose fluconazole. --Greatly appreciate Dr. Carlis Abbott seeing the patient and rulling out Candida endophthalmitis --PICC is out,  --Culture catheter tip from PICC  --Repeat blood cultures after removal of PICC line  - he will require at least 2 weeks of post PICC line for antifungal therapy  #2 possible health care associated pneumonia:  Pseudomonas and on one culture from the fifth sensitive to Zosyn patient will be 14 days of effective anti-pseudomonal therapy  #3 Worsening leukocytosis: Given his recent intra-abdominal pathology I  would strongly recommend getting a CT of the abdomen and pelvis with contrast to exclude infection in this area  #3 ischemia intra-abdominal necrosis: Patient is also probably covered for intra-abdominal  infection with Zosyn.   #4 screening  HIV negative and hepatitis panel pending  #5 Posterior vitreal Detachment: watch for signs of retinal detachment and also any visual changes and patient should followup with Dr. Carlis Abbott office number [phone]   LOS: 10 days   Alcide Evener 03/30/2014, 3:13 PM

## 2014-03-30 NOTE — Progress Notes (Signed)
ANTIBIOTIC CONSULT NOTE - follow up  Pharmacy Consult for fluconazole Indication: C. Albicans bloodstream infection  No Known Allergies  Patient Measurements: Height: 5\' 5"  (165.1 cm) Weight: 186 lb 4.6 oz (84.5 kg) IBW/kg (Calculated) : 61.5 Adjusted Body Weight:   Vital Signs: Temp: 98.6 F (37 C) (08/10 1400) Temp src: Oral (08/10 1400) BP: 139/92 mmHg (08/10 1400) Pulse Rate: 93 (08/10 1148) Intake/Output from previous day: 08/09 0701 - 08/10 0700 In: 400 [I.V.:200; IV Piggyback:200] Out: 1345 [Urine:925; Drains:125; Stool:295] Intake/Output from this shift: Total I/O In: 320 [P.O.:240; I.V.:80] Out: 375 [Urine:275; Drains:100]  Labs:  Recent Labs  03/28/14 0410 03/29/14 0345  WBC 38.3* 34.3*  HGB 8.2* 7.9*  PLT 357 442*  CREATININE 0.71  --    Estimated Creatinine Clearance: 101.9 ml/min (by C-G formula based on Cr of 0.71).  Microbiology: Recent Results (from the past 720 hour(s))  MRSA PCR SCREENING     Status: None   Collection Time    03/24/14  5:00 PM      Result Value Ref Range Status   MRSA by PCR NEGATIVE  NEGATIVE Final   Comment:            The GeneXpert MRSA Assay (FDA     approved for NASAL specimens     only), is one component of a     comprehensive MRSA colonization     surveillance program. It is not     intended to diagnose MRSA     infection nor to guide or     monitor treatment for     MRSA infections.  CULTURE, BLOOD (ROUTINE X 2)     Status: None   Collection Time    03/24/14  9:50 PM      Result Value Ref Range Status   Specimen Description BLOOD R FOOT   Final   Special Requests BOTTLES DRAWN AEROBIC AND ANAEROBIC 5 CC EACH   Final   Culture  Setup Time     Final   Value: 03/25/2014 00:20     Performed at [facility]   Culture     Final   Value: CANDIDA ALBICANS     Note: Gram Stain Report Called to,Read Back By and Verified With: Darrin Nipper ON 03/26/2014 AT 8:00P BY WILEJ     Performed at [facility]   Report Status 03/28/2014 FINAL   Final  CULTURE, BLOOD (ROUTINE X 2)     Status: None   Collection Time    03/24/14 10:00 PM      Result Value Ref Range Status   Specimen Description BLOOD L FOOT   Final   Special Requests BOTTLES DRAWN AEROBIC AND ANAEROBIC 5 CC EACH   Final   Culture  Setup Time     Final   Value: 03/25/2014 00:20     Performed at [facility]   Culture     Final   Value:        BLOOD CULTURE RECEIVED NO GROWTH TO DATE CULTURE WILL BE HELD FOR 5 DAYS BEFORE ISSUING A FINAL NEGATIVE REPORT     Performed at [facility]   Report Status PENDING   Incomplete  CULTURE, RESPIRATORY (NON-EXPECTORATED)     Status: None   Collection Time    03/25/14  9:20 AM      Result Value Ref Range Status   Specimen Description ENDOTRACHEAL   Final   Special Requests NONE   Final  Gram Stain     Final   Value: FEW WBC PRESENT, PREDOMINANTLY PMN     NO SQUAMOUS EPITHELIAL CELLS SEEN     FEW YEAST     Performed at [facility]   Culture     Final   Value: FEW PSEUDOMONAS AERUGINOSA     ABUNDANT CANDIDA ALBICANS     Performed at [facility]   Report Status 03/28/2014 FINAL   Final   Organism ID, Bacteria PSEUDOMONAS AERUGINOSA   Final  CULTURE, BLOOD (ROUTINE X 2)     Status: None   Collection Time    03/27/14  9:50 AM      Result Value Ref Range Status   Specimen Description BLOOD R HAND   Final   Special Requests BOTTLES DRAWN AEROBIC AND ANAEROBIC 5CC   Final   Culture  Setup Time     Final   Value: 03/27/2014 12:42     Performed at [facility]   Culture     Final   Value:        BLOOD CULTURE RECEIVED NO GROWTH TO DATE CULTURE WILL BE HELD FOR 5 DAYS BEFORE ISSUING A FINAL NEGATIVE REPORT     Performed at [facility]   Report Status PENDING   Incomplete  CULTURE, BLOOD (ROUTINE X 2)     Status: None   Collection Time    03/27/14  9:53 AM      Result Value Ref Range Status   Specimen Description BLOOD R HAND    Final   Special Requests BOTTLES DRAWN AEROBIC ONLY 6CC   Final   Culture  Setup Time     Final   Value: 03/27/2014 12:42     Performed at [facility]   Culture     Final   Value:        BLOOD CULTURE RECEIVED NO GROWTH TO DATE CULTURE WILL BE HELD FOR 5 DAYS BEFORE ISSUING A FINAL NEGATIVE REPORT     Performed at [facility]   Report Status PENDING   Incomplete  CATH TIP CULTURE     Status: None   Collection Time    03/28/14 12:31 AM      Result Value Ref Range Status   Specimen Description CATH TIP   Final   Special Requests NONE   Final   Culture     Final   Value: NO GROWTH 2 DAYS     Performed at [facility]   Report Status 03/30/2014 FINAL   Final    Assessment: 60 yoM with newly diagnosed rectal cancer s/p low anterior resection and diverting loop ileostostomy on 7/31, developed ileus, tachycardia, emesis 8/03. He was taken to OR for re-assessment and found to have ischemia of colonanal anastomosis. He developed emesis and aspiration during anesthesia induction. He returned to ICU on vent and hypotensive.  Pharmacy consulted to dose zosyn for aspiration PNA and fluconazole for candidemia  8/3 >> CTX >> 8/4 8/4 >> cefotetan preop x 1 8/4 >> zosyn  8/4 >> metronidazole >> 8/6 8/7 >> micafungin >> 8/10 8/10 >> fluconazole >>  Micro: 8/4 blood: yeast 1 of 2 - C.albicans 8/4 sputum: few pseudomonas - pansensitive, C.albicans 8/4 MRSA PCR negative 8/8 cath tip: ngtd 8/7 blood x 2: ngtd  8/10 Day #7 Zosyn and Day #4 Micafungin/D#1 fluconazole  Tmax: afebrile  WBCs: 34.3, remains elevated  Renal: SCr 0.71 with  CrC l >100 ml/min  Goal  of Therapy:  Appropriate abx dosing, eradication of infection.    Plan:   Fluconazole 400mg  IV q24h starting tonight  Rx will follow at distance  Doreene Eland, PharmD, BCPS.   Pager: [phone] 03/30/2014 3:40 PM

## 2014-03-30 NOTE — Progress Notes (Signed)
[facility]  [address] [place], [place] [address] Phone: [phone] FAX: Wade [phone] 24-Jun-1956  CARE TEAM:  PCP: Rubbie Battiest, MD  Outpatient Care Team: Patient Care Team: [name], MD as PCP - General (Family Medicine) Satira Sark, MD as Consulting Physician (Cardiology) Danie Binder, MD as Consulting Physician (Gastroenterology) Milus Banister, MD as Attending Physician (Gastroenterology) Farrel Gobble, MD as Consulting Physician (Medical Oncology) Adin Hector, MD as Consulting Physician (General Surgery)  Inpatient Treatment Team: Treatment Team: Attending Provider: Adin Hector, MD; Registered Nurse: Kristopher Oppenheim, RN; Dietitian: Toribio Harbour, RD; Registered Nurse: Laurence Slate, RN; Registered Nurse: Nadara Mode, RN   Subjective:  Transferred to floor Trying solids PO Sore Tired but pain controlled  Objective:  Vital signs:  Filed Vitals:   03/30/14 0000 03/30/14 0052 03/30/14 0605 03/30/14 0655  BP:  120/69    Pulse:      Temp: 99.6 F (37.6 C)     TempSrc: Oral     Resp:  22 24 28   Height:      Weight:      SpO2:  97% 98% 98%    Last BM Date: 03/29/14  Intake/Output   Yesterday:  08/09 0701 - 08/10 0700 In: 400 [I.V.:200; IV Piggyback:200] Out: 1660 [Urine:725; Drains:125; Stool:295] This shift:     Bowel function:  Flatus: yes  BM: effluent in ileostomy bag  Drain: serosanguinous  Physical Exam:  General: Pt awake/alert/oriented x4 in no acute distress.  Tired but not toxic. Eyes: PERRL, normal EOM.  Sclera clear.  No icterus Neuro: CN II-XII intact w/o focal sensory/motor deficits. Lymph: No head/neck/groin lymphadenopathy Psych:  No delerium/psychosis/paranoia HENT: Normocephalic, Mucus membranes moist.  No thrush.  NGT in place Neck: Supple, No tracheal deviation Chest: No chest wall pain w good excursion CV:   Pulses intact.  Regular rhythm MS: Normal AROM mjr joints.  No obvious deformity Abdomen: Soft.  Mod distended.   Ileostomy & end colostomy edematous but viable.  Serous fluid in bag.  No evidence of peritonitis.  No incarcerated hernias.  Wound vac in place & clean Ext:  SCDs BLE.  2+ BLE edema.  No cyanosis Skin: No petechiae / purpura   Problem List:   Principal Problem:   Rectal cancer, uT2uN0, s/p LAR/coloanal/ileostomy 03/20/2014 Active Problems:   Tobacco abuse, in remission   GERD (gastroesophageal reflux disease)   Obesity (BMI 30-39.9)   ards   Aspiration pneumonia   Assessment  [name]  58 y.o. male  6 Days Post-Op    PROCEDURE 03/20/2014:  ROBOTIC ASSISTED low anterior resection  TRANSANAL TRANSABDOMINAL TME WITH HANDSEWN COLOANAL ANASTOMOSIS  splenic flexure mobilization  diverting loop ileostomy  PROCEDURE 03/24/2014:  FLEXIBLE SIGMOIDOSCOPY  Exploratory laparotomy  Takedown of coloanal anastomosis  Distal colectomy & end colostomy  Wound vac     Recovering relatively well only from laparotomy for ischemic/necrotic colon.  Ileus resolving  Plan:  Agree with IV Zosyn for probable aspiration pneumonitis/PNA.  Secretions were minimal and Hh has tolerated extubation for the past several days.  That is hopeful sign.  We will see  Yeast on 1/2 BC & in initial TASP - antifungal coverage per ID.  Ileostomy/colostomy care.  Diuresis. - KVO & PRN IVF.  Retry diuretics x 24hrs  Follow electrolytes.  Stable K   PPI for GERD.    Condom cath.  Pathology  T2 N0.  Stage I. Cure rate very high.  Just need to help get him through this stressful period  VTE prophylaxis- SCDs, etc  I updated the patient's status to the patient.  Recommendations were made.  Questions were answered.  The patient expressed understanding & appreciation.   Adin Hector, M.D., F.A.C.S. Gastrointestinal and Minimally Invasive Surgery [facility] [address] [place], [place] [address] [phone] Main / Paging   03/30/2014   Results:   Labs: Results for orders placed during the hospital encounter of 03/20/14 (from the past 48 hour(s))  CBC     Status: Abnormal   Collection Time    03/29/14  3:45 AM      Result Value Ref Range   WBC 34.3 (*) 4.0 - 10.5 K/uL   Comment: CONSISTENT WITH PREVIOUS RESULT   RBC 2.52 (*) 4.22 - 5.81 MIL/uL   Hemoglobin 7.9 (*) 13.0 - 17.0 g/dL   HCT 23.7 (*) 39.0 - 52.0 %   MCV 94.0  78.0 - 100.0 fL   MCH 31.3  26.0 - 34.0 pg   MCHC 33.3  30.0 - 36.0 g/dL   RDW 14.1  11.5 - 15.5 %   Platelets 442 (*) 150 - 400 K/uL    Imaging / Studies: Dg Chest Port 1 View  03/29/2014   CLINICAL DATA:  58 year old male with aspiration pneumonia  EXAM: PORTABLE CHEST - 1 VIEW  COMPARISON:  03/28/2014 and prior chest radiographs dating back to 12/29/2013  FINDINGS: Cardiomediastinal silhouette is unchanged.  Bilateral airspace opacities are again noted without significant change.  There is no evidence of pleural effusion or pneumothorax.  No acute bony abnormalities are identified.  IMPRESSION: Unchanged bilateral airspace opacities from 03/28/2014.   Electronically Signed   By: Hassan Rowan M.D.   On: 03/29/2014 07:10    Medications / Allergies: per chart  Antibiotics: Anti-infectives   Start     Dose/Rate Route Frequency Ordered Stop   03/27/14 0000  micafungin (MYCAMINE) 100 mg in sodium chloride 0.9 % 100 mL IVPB     100 mg 100 mL/hr over 1 Hours Intravenous Daily at bedtime 03/26/14 2356     03/25/14 0800  cefTRIAXone (ROCEPHIN) 2 g in dextrose 5 % 50 mL IVPB  Status:  Discontinued    Comments:  Pharmacy may adjust dosing strength / duration / interval for maximal efficacy   2 g 100 mL/hr over 30 Minutes Intravenous Every 24 hours 03/24/14 1655 03/24/14 2102   03/24/14 2200  piperacillin-tazobactam (ZOSYN) IVPB 3.375 g     3.375 g 12.5 mL/hr over 240 Minutes Intravenous 3 times per day  03/24/14 2133 03/31/14 1824   03/24/14 2000  metroNIDAZOLE (FLAGYL) IVPB 500 mg  Status:  Discontinued     500 mg 100 mL/hr over 60 Minutes Intravenous Every 6 hours 03/24/14 1655 03/26/14 1824   03/24/14 0730  [MAR Hold]  cefoTEtan (CEFOTAN) 2 g in dextrose 5 % 50 mL IVPB     (On MAR Hold since 03/24/14 1125)  Comments:  Pharmacy may adjust dose strength for optimal dosing.   Send with patient on call to the OR.  Anesthesia to complete antibiotic administration <103min prior to incision per [facility].   2 g 100 mL/hr over 30 Minutes Intravenous On call to O.R. 03/24/14 8295 03/24/14 1232   03/23/14 0830  cefTRIAXone (ROCEPHIN) 2 g in dextrose 5 % 50 mL IVPB  Status:  Discontinued  Comments:  Pharmacy may adjust dosing strength / duration / interval for maximal efficacy   2 g 100 mL/hr over 30 Minutes Intravenous Every 24 hours 03/23/14 0738 03/24/14 1655   03/23/14 0800  metroNIDAZOLE (FLAGYL) IVPB 500 mg  Status:  Discontinued     500 mg 100 mL/hr over 60 Minutes Intravenous Every 6 hours 03/23/14 0738 03/24/14 1655   03/20/14 2359  cefoTEtan (CEFOTAN) 2 g in dextrose 5 % 50 mL IVPB     2 g 100 mL/hr over 30 Minutes Intravenous Every 12 hours 03/20/14 1704 03/20/14 2345   03/20/14 1228  clindamycin (CLEOCIN) 900 mg, gentamicin (GARAMYCIN) 240 mg in sodium chloride 0.9 % 1,000 mL for intraperitoneal lavage  Status:  Discontinued       As needed 03/20/14 1228 03/20/14 1329   03/20/14 0518  cefoTEtan (CEFOTAN) 2 g in dextrose 5 % 50 mL IVPB     2 g 100 mL/hr over 30 Minutes Intravenous On call to O.R. 03/20/14 0518 03/20/14 1259   03/20/14 0000  clindamycin (CLEOCIN) 900 mg, gentamicin (GARAMYCIN) 240 mg in sodium chloride 0.9 % 1,000 mL for intraperitoneal lavage  Status:  Discontinued    Comments:  Pharmacy may adjust dosing strength, schedule, rate of infusion, etc as needed to optimize therapy    Intraperitoneal To Surgery 03/19/14 1341 03/20/14 1453   03/19/14 1345  clindamycin  (CLEOCIN) 900 mg, gentamicin (GARAMYCIN) 240 mg in sodium chloride 0.9 % 1,000 mL for intraperitoneal lavage  Status:  Discontinued    Comments:  Pharmacy may adjust dosing strength, schedule, rate of infusion, etc as needed to optimize therapy   1 application Intraperitoneal To Surgery 03/19/14 1339 03/19/14 1341       Note: Portions of this report may have been transcribed using voice recognition software. Every effort was made to ensure accuracy; however, inadvertent computerized transcription errors may be present.   Any transcriptional errors that result from this process are unintentional.

## 2014-03-30 NOTE — Consult Note (Signed)
CONSULT OPHTHALMOLOGY CC: NEW ONSET FLOATERS LEFT EYE. RECENT HISTORY OF COLON CANCER WITH RECENT COLONSCOPY BCVA NEAR CARD 20/20 20/25 IOP  20/20 SLE CORNEA CLEAR AC DEEP AND QUIET LENS EARLY NSC FUNDUS (DILATED) C/D 0.35 0.4 DISC SHARP RETINA FLAT VITREOUS CLEAR OD DISC SHARP RETINA FLAT POSTERIOR VITREOUS DETACHMENT  I SEE NO VITREOUS DEBRI AT THIS TIME CONSISTENT WITH ANY INTRAOCULAR INVOLVEMENT OF HIS SYSTEMIC INFECTION  IMPRESSION:  POSTERIOR VITREOUS DETACHMENT PLAN: 1. WATCH FOR NOW.  HE MAY FOLLOW UP WITH DR. Lucita Ferrara.  CALL [phone]. 2. DISCUSSED SIGNS AND SYMPTOMS OF RETINAL DETACHMENT  Vevelyn Royals, MD

## 2014-03-30 NOTE — Progress Notes (Signed)
ANTIBIOTIC CONSULT NOTE - follow up  Pharmacy Consult for Zosyn Indication: Aspiration PNA  No Known Allergies  Patient Measurements: Height: 5\' 5"  (165.1 cm) Weight: 186 lb 4.6 oz (84.5 kg) IBW/kg (Calculated) : 61.5 Adjusted Body Weight:   Vital Signs: Temp: 98.5 F (36.9 C) (08/10 0700) Temp src: Oral (08/10 0700) BP: 120/69 mmHg (08/10 0052) Intake/Output from previous day: 08/09 0701 - 08/10 0700 In: 400 [I.V.:200; IV Piggyback:200] Out: 1345 [Urine:925; Drains:125; Stool:295] Intake/Output from this shift: Total I/O In: 240 [P.O.:240] Out: 100 [Drains:100]  Labs:  Recent Labs  03/28/14 0410 03/29/14 0345  WBC 38.3* 34.3*  HGB 8.2* 7.9*  PLT 357 442*  CREATININE 0.71  --    Estimated Creatinine Clearance: 101.9 ml/min (by C-G formula based on Cr of 0.71).  Microbiology: Recent Results (from the past 720 hour(s))  MRSA PCR SCREENING     Status: None   Collection Time    03/24/14  5:00 PM      Result Value Ref Range Status   MRSA by PCR NEGATIVE  NEGATIVE Final   Comment:            The GeneXpert MRSA Assay (FDA     approved for NASAL specimens     only), is one component of a     comprehensive MRSA colonization     surveillance program. It is not     intended to diagnose MRSA     infection nor to guide or     monitor treatment for     MRSA infections.  CULTURE, BLOOD (ROUTINE X 2)     Status: None   Collection Time    03/24/14  9:50 PM      Result Value Ref Range Status   Specimen Description BLOOD R FOOT   Final   Special Requests BOTTLES DRAWN AEROBIC AND ANAEROBIC 5 CC EACH   Final   Culture  Setup Time     Final   Value: 03/25/2014 00:20     Performed at [facility]   Culture     Final   Value: CANDIDA ALBICANS     Note: Gram Stain Report Called to,Read Back By and Verified With: Darrin Nipper ON 03/26/2014 AT 8:00P BY WILEJ     Performed at [facility]   Report Status 03/28/2014 FINAL   Final  CULTURE, BLOOD (ROUTINE X  2)     Status: None   Collection Time    03/24/14 10:00 PM      Result Value Ref Range Status   Specimen Description BLOOD L FOOT   Final   Special Requests BOTTLES DRAWN AEROBIC AND ANAEROBIC 5 CC EACH   Final   Culture  Setup Time     Final   Value: 03/25/2014 00:20     Performed at [facility]   Culture     Final   Value:        BLOOD CULTURE RECEIVED NO GROWTH TO DATE CULTURE WILL BE HELD FOR 5 DAYS BEFORE ISSUING A FINAL NEGATIVE REPORT     Performed at [facility]   Report Status PENDING   Incomplete  CULTURE, RESPIRATORY (NON-EXPECTORATED)     Status: None   Collection Time    03/25/14  9:20 AM      Result Value Ref Range Status   Specimen Description ENDOTRACHEAL   Final   Special Requests NONE   Final   Gram Stain     Final  Value: FEW WBC PRESENT, PREDOMINANTLY PMN     NO SQUAMOUS EPITHELIAL CELLS SEEN     FEW YEAST     Performed at [facility]   Culture     Final   Value: FEW PSEUDOMONAS AERUGINOSA     ABUNDANT CANDIDA ALBICANS     Performed at [facility]   Report Status 03/28/2014 FINAL   Final   Organism ID, Bacteria PSEUDOMONAS AERUGINOSA   Final  CULTURE, BLOOD (ROUTINE X 2)     Status: None   Collection Time    03/27/14  9:50 AM      Result Value Ref Range Status   Specimen Description BLOOD R HAND   Final   Special Requests BOTTLES DRAWN AEROBIC AND ANAEROBIC 5CC   Final   Culture  Setup Time     Final   Value: 03/27/2014 12:42     Performed at [facility]   Culture     Final   Value:        BLOOD CULTURE RECEIVED NO GROWTH TO DATE CULTURE WILL BE HELD FOR 5 DAYS BEFORE ISSUING A FINAL NEGATIVE REPORT     Performed at [facility]   Report Status PENDING   Incomplete  CULTURE, BLOOD (ROUTINE X 2)     Status: None   Collection Time    03/27/14  9:53 AM      Result Value Ref Range Status   Specimen Description BLOOD R HAND   Final   Special Requests BOTTLES DRAWN AEROBIC ONLY 6CC   Final   Culture   Setup Time     Final   Value: 03/27/2014 12:42     Performed at [facility]   Culture     Final   Value:        BLOOD CULTURE RECEIVED NO GROWTH TO DATE CULTURE WILL BE HELD FOR 5 DAYS BEFORE ISSUING A FINAL NEGATIVE REPORT     Performed at [facility]   Report Status PENDING   Incomplete  CATH TIP CULTURE     Status: None   Collection Time    03/28/14 12:31 AM      Result Value Ref Range Status   Specimen Description CATH TIP   Final   Special Requests NONE   Final   Culture     Final   Value: NO GROWTH 2 DAYS     Performed at [facility]   Report Status 03/30/2014 FINAL   Final    Assessment: 45 yoM with newly diagnosed rectal cancer s/p low anterior resection and diverting loop ileostostomy on 7/31, developed ileus, tachycardia, emesis 8/03. He was taken to OR for re-assessment and found to have ischemia of colonanal anastomosis. He developed emesis and aspiration during anesthesia induction. He returned to ICU on vent and hypotensive.  Pharmacy consulted to dose zosyn for aspiration PNA.   8/3 >> CTX >> 8/4 8/4 >> cefotetan preop x 1 8/4 >> zosyn  8/4 >> metronidazole >> 8/6 8/7 >> micafungin >>  Micro: 8/4 blood: yeast 1 of 2 - C.albicans 8/4 sputum: few pseudomonas - pansensitive, C.albicans 8/4 MRSA PCR negative 8/8 cath tip: ngtd 8/7 blood x 2: ngtd  8/10 Day #7 Zosyn and Day #4 Micafungin  Tmax: afebrile  WBCs: 34.3, remains elevated  Renal: SCr 0.71 with  CrC l >100 ml/min   Goal of Therapy:  Appropriate abx dosing, eradication of infection.    Plan:   Continue Zosyn  3.375g IV q8h (infuse over 4 hours)  Continue micafungin per MD .  Follow up renal function and cultures as available.    Gretta Arab PharmD, BCPS Pager [phone] 03/30/2014 10:39 AM

## 2014-03-31 ENCOUNTER — Telehealth (INDEPENDENT_AMBULATORY_CARE_PROVIDER_SITE_OTHER): Payer: Self-pay | Admitting: Surgery

## 2014-03-31 DIAGNOSIS — H43819 Vitreous degeneration, unspecified eye: Secondary | ICD-10-CM

## 2014-03-31 DIAGNOSIS — F17200 Nicotine dependence, unspecified, uncomplicated: Secondary | ICD-10-CM

## 2014-03-31 DIAGNOSIS — D72829 Elevated white blood cell count, unspecified: Secondary | ICD-10-CM

## 2014-03-31 DIAGNOSIS — J690 Pneumonitis due to inhalation of food and vomit: Secondary | ICD-10-CM

## 2014-03-31 DIAGNOSIS — E669 Obesity, unspecified: Secondary | ICD-10-CM

## 2014-03-31 DIAGNOSIS — J984 Other disorders of lung: Secondary | ICD-10-CM

## 2014-03-31 DIAGNOSIS — K219 Gastro-esophageal reflux disease without esophagitis: Secondary | ICD-10-CM

## 2014-03-31 DIAGNOSIS — Z0289 Encounter for other administrative examinations: Secondary | ICD-10-CM

## 2014-03-31 LAB — CBC
HCT: 25.6 % — ABNORMAL LOW (ref 39.0–52.0)
HEMOGLOBIN: 8.4 g/dL — AB (ref 13.0–17.0)
MCH: 31.1 pg (ref 26.0–34.0)
MCHC: 32.8 g/dL (ref 30.0–36.0)
MCV: 94.8 fL (ref 78.0–100.0)
Platelets: 579 10*3/uL — ABNORMAL HIGH (ref 150–400)
RBC: 2.7 MIL/uL — ABNORMAL LOW (ref 4.22–5.81)
RDW: 14.6 % (ref 11.5–15.5)
WBC: 21.3 10*3/uL — AB (ref 4.0–10.5)

## 2014-03-31 LAB — POTASSIUM: Potassium: 3.9 mEq/L (ref 3.7–5.3)

## 2014-03-31 LAB — CREATININE, SERUM: Creatinine, Ser: 0.89 mg/dL (ref 0.50–1.35)

## 2014-03-31 LAB — CULTURE, BLOOD (ROUTINE X 2): Culture: NO GROWTH

## 2014-03-31 NOTE — Progress Notes (Signed)
CSW assisting with d/c planning. [name] has made a bed offer. CSW will assist with d/c planning to SNF once stable for d/c.   Werner Lean LCSW [phone]

## 2014-03-31 NOTE — Telephone Encounter (Signed)
I updated the status of the patient to the patient's wife over the phone.  I made recommendations.  I answered questions.  Understanding & appreciation was expressed.

## 2014-03-31 NOTE — Consult Note (Signed)
WOC wound follow up Wound type:Surgical Measurement:14cm x 4cm x 0.2cm Wound FXJ:OITGP, pink, moist, free of necrotic tissue Drainage (amount, consistency, odor) scant serous Periwound:intact Dressing procedure/placement/frequency:NPWT applied at 125 mmHg negative pressure, tolerated well.  One piece of black foam used as wound contact layer. Next schedule change date is Thursday.  Bedside RN may do.  WOC ostomy consult note Stoma type/location: RLQ loop ileostomy Stomal assessment/size: 1 and 1/2 inches round, slightly budded, moist Peristomal assessment: intact, clear Treatment options for stomal/peristomal skin: skin barrier ring Output dark green effluent, thickening on regular diet Ostomy pouching: 2pc. , 2 and 1/4 ostomy pouching system with skin barrier ring Education provided: Patient is asking appropriate questions regarding diet and supplies.  WOC ostomy follow up Stoma type/location: LLQ Colostomy Stomal assessment/size: 1 and 1/2 inches round, nearly flush, moist Peristomal assessment: intact, clear Treatment options for stomal/peristomal skin: none indicated Output none Ostomy pouching: 2pc., 2 and 1/4 inch pouching system with closed end, opaque pouch with gas filter applied Education provided: Patient is focused on eating today as he had emesis this am after attempting Ensure.Patient states that he loves peanut butter (creamy) and that peanut butter crackers are his favorite snack.  It is reinforced that he is on a regular diet, but that he must chew his food well and drink plenty of fluids while eating. Enrolled patient in [facility] Start Discharge program: Valentina Gu both stomas today  [facility] nursing team will follow; ostomy pouches are to be changed twice weekly on Tuesdays and Fridays; NPWT is changed on T-TH-Saturday and staff RN may do.  WOC will remain available to this patient, the nursing and medical team.   Thanks, Maudie Flakes, MSN, RN, GNP, CWOCN, CWON-AP  [phone] Time spent on this encounter = 75 minutes

## 2014-03-31 NOTE — Progress Notes (Signed)
Physical Therapy Treatment Patient Details Name: [name] MRN: [id number] DOB: 04/12/1956 Today's Date: 04-18-14    History of Present Illness 58 yo with rectal cancer s/p LAR/coloanal/ileostomy 03/20/2014 and exploratory lapartomy with takedown of coloanal anastomosis with partial colectomy, colostomy and VAC placement 03/24/14    PT Comments    Pt progressing, incr activity tolerance and did not de-sat with activity today  Follow Up Recommendations  SNF;Supervision for mobility/OOB     Equipment Recommendations  Rolling walker with 5" wheels    Recommendations for Other Services       Precautions / Restrictions Precautions Precautions: Fall Precaution Comments: colostomy, wound VAC, JP drain    Mobility  Bed Mobility Overal bed mobility: Needs Assistance Bed Mobility: Supine to Sit     Supine to sit: Min assist     General bed mobility comments: assist with UB, cues to roll but pt wanted to try from supine, HOB elevated 40*  Transfers Overall transfer level: Needs assistance Equipment used: Rolling walker (2 wheeled) Transfers: Sit to/from Stand Sit to Stand: Min guard         General transfer comment: verbal cues for hand placement  Ambulation/Gait Ambulation/Gait assistance: Min guard Ambulation Distance (Feet): 220 Feet Assistive device: Rolling walker (2 wheeled) Gait Pattern/deviations: Step-through pattern;Wide base of support Gait velocity: decreased   General Gait Details: 2 brief(10sec) standing rests due to DOE; pt able to converse most of distance and maintain sats >89% on RA   Stairs            Wheelchair Mobility    Modified Rankin (Stroke Patients Only)       Balance                                    Cognition Arousal/Alertness: Awake/alert Behavior During Therapy: WFL for tasks assessed/performed Overall Cognitive Status: Within Functional Limits for tasks assessed                       Exercises General Exercises - Lower Extremity Ankle Circles/Pumps: AROM;Both;15 reps Quad Sets: Strengthening;Both;15 reps Hip ABduction/ADduction: Strengthening;AROM;Both;15 reps    General Comments        Pertinent Vitals/Pain Pain Assessment:  (much better)    Home Living                      Prior Function            PT Goals (current goals can now be found in the care plan section) Acute Rehab PT Goals Patient Stated Goal: I want to walk Time For Goal Achievement: 04/11/14 Potential to Achieve Goals: Good Progress towards PT goals: Progressing toward goals    Frequency  Min 3X/week    PT Plan Current plan remains appropriate    Co-evaluation             End of Session   Activity Tolerance: Patient tolerated treatment well Patient left: in chair;with call bell/phone within reach     Time: 6440-3474 PT Time Calculation (min): 31 min  Charges:  $Gait Training: 23-37 mins                    G Codes:      Lisseth Brazeau 18-Apr-2014, 5:09 PM

## 2014-03-31 NOTE — Progress Notes (Signed)
Clinical Social Work Department CLINICAL SOCIAL WORK PLACEMENT NOTE 03/31/2014  Patient:  [name], [name]  Account Number:  [id number] Admit date:  03/20/2014  Clinical Social Worker:  Werner Lean, LCSW  Date/time:  03/31/2014 03:02 PM  Clinical Social Work is seeking post-discharge placement for this patient at the following level of care:   SKILLED NURSING   (*CSW will update this form in Epic as items are completed)     Patient/family provided with [place] Department of Clinical Social Work's list of facilities offering this level of care within the geographic area requested by the patient (or if unable, by the patient's family).  03/31/2014  Patient/family informed of their freedom to choose among providers that offer the needed level of care, that participate in Medicare, Medicaid or managed care program needed by the patient, have an available bed and are willing to accept the patient.  03/31/2014  Patient/family informed of [facility]' ownership interest in [facility], as well as of the fact that they are under no obligation to receive care at this facility.  PASARR submitted to EDS on 03/30/2014 PASARR number received on 03/30/2014  FL2 transmitted to all facilities in geographic area requested by pt/family on  03/31/2014 FL2 transmitted to all facilities within larger geographic area on   Patient informed that his/her managed care company has contracts with or will negotiate with  certain facilities, including the following:     Patient/family informed of bed offers received:   Patient chooses bed at  Physician recommends and patient chooses bed at    Patient to be transferred to  on   Patient to be transferred to facility by  Patient and family notified of transfer on  Name of family member notified:    The following physician request were entered in Epic:   Additional Comments:  Werner Lean LCSW [phone]

## 2014-03-31 NOTE — Progress Notes (Signed)
[facility]  [address] [place], [place] [address] Phone: [phone] FAX: [place] [phone] [birth year]  CARE TEAM:  PCP: Rubbie Battiest, MD  Outpatient Care Team: Patient Care Team: [name], MD as PCP - General (Family Medicine) Satira Sark, MD as Consulting Physician (Cardiology) Danie Binder, MD as Consulting Physician (Gastroenterology) Milus Banister, MD as Attending Physician (Gastroenterology) Farrel Gobble, MD as Consulting Physician (Medical Oncology) Adin Hector, MD as Consulting Physician (General Surgery)  Inpatient Treatment Team: Treatment Team: Attending Provider: Adin Hector, MD; Registered Nurse: Kristopher Oppenheim, RN; Dietitian: Toribio Harbour, RD; Registered Nurse: Laurence Slate, RN; Registered Nurse: Nadara Mode, RN; Technician: Rudi Heap, NT; Technician: Leda Quail, NT; Physical Therapist: Neil Crouch, PT   Subjective:  "I'm 100% better!" Trying solids PO Pain controlled  Objective:  Vital signs:  Filed Vitals:   03/30/14 1400 03/30/14 2133 03/31/14 0123 03/31/14 0459  BP: 139/92 168/77 117/78 127/84  Pulse:  93 82 92  Temp: 98.6 F (37 C) 99.1 F (37.3 C) 98.7 F (37.1 C) 98.6 F (37 C)  TempSrc: Oral Oral Oral Oral  Resp: '28 20 18 20  ' Height:      Weight:    169 lb 3.2 oz (76.749 kg)  SpO2: 98% 94% 93% 93%    Last BM Date: 03/29/14  Intake/Output   Yesterday:  08/10 0701 - 08/11 0700 In: 36 [P.O.:240; I.V.:110; IV Piggyback:300] Out: 2400 [Urine:2125; Drains:225; Stool:50] This shift:  Total I/O In: -  Out: 250 [Urine:250]  Bowel function:  Flatus: yes  BM: effluent in ileostomy bag  Drain: serosanguinous  Physical Exam:  General: Pt awake/alert/oriented x4 in no acute distress.  No tired & not toxic. Eyes: PERRL, normal EOM.  Sclera clear.  No icterus Neuro: CN II-XII intact w/o focal  sensory/motor deficits. Lymph: No head/neck/groin lymphadenopathy Psych:  No delerium/psychosis/paranoia HENT: Normocephalic, Mucus membranes moist.  No thrush.  NGT in place Neck: Supple, No tracheal deviation Chest: No chest wall pain w good excursion CV:  Pulses intact.  Regular rhythm MS: Normal AROM mjr joints.  No obvious deformity Abdomen: Soft.  Mod distended.   Ileostomy & end colostomy edematous but viable.  Serous fluid in bag.  No evidence of peritonitis.  No incarcerated hernias.  Wound vac in place & clean Ext:  SCDs BLE.  2+ BLE edema.  No cyanosis Skin: No petechiae / purpura   Problem List:   Principal Problem:   Rectal cancer, uT2uN0, s/p LAR/coloanal/ileostomy 03/20/2014 Active Problems:   Tobacco abuse, in remission   GERD (gastroesophageal reflux disease)   Obesity (BMI 30-39.9)   ards   Aspiration pneumonia   Assessment  [name]  58 y.o. male  7 Days Post-Op    PROCEDURE 03/20/2014:  ROBOTIC ASSISTED low anterior resection  TRANSANAL TRANSABDOMINAL TME WITH HANDSEWN COLOANAL ANASTOMOSIS  splenic flexure mobilization  diverting loop ileostomy  PROCEDURE 03/24/2014:  FLEXIBLE SIGMOIDOSCOPY  Exploratory laparotomy  Takedown of coloanal anastomosis  Distal colectomy & end colostomy  Wound vac     Recovering.  Ileus resolving  Plan:  Agree with IV Zosyn for probable aspiration pneumonitis/PNA.  Productive cough for the past several days.  Using IS a lot.  That is hopeful.  We will see  Yeast on 1/2 BC & in initial TASP - antifungal coverage per ID.  Ileostomy/colostomy care.  Check wound  Diuresis. -  KVO & PRN IVF.  Retry diuretics x 24hrs  Follow electrolytes.  Stable K   PPI for GERD.    Pathology T2 N0.  Stage I. Cure rate very high.  Just need to help get him through this stressful period  VTE prophylaxis- SCDs, etc  Placement:  Look at options for SNF for PT/OT/ostomy/wound care  I updated the patient's status to the  patient.  Recommendations were made.  He wants me to try & reach his wife whom cannot get to the hospital - will try to call her today.  Questions were answered.  The patient expressed understanding & appreciation.   Adin Hector, M.D., F.A.C.S. Gastrointestinal and Minimally Invasive Surgery [facility] [address], [place], [place] [address] [phone] Main / Paging   03/31/2014   Results:   Labs: Results for orders placed during the hospital encounter of 03/20/14 (from the past 48 hour(s))  POTASSIUM     Status: None   Collection Time    03/31/14  5:18 AM      Result Value Ref Range   Potassium 3.9  3.7 - 5.3 mEq/L  CREATININE, SERUM     Status: None   Collection Time    03/31/14  5:18 AM      Result Value Ref Range   Creatinine, Ser 0.89  0.50 - 1.35 mg/dL   GFR calc non Af Amer >90  >90 mL/min   GFR calc Af Amer >90  >90 mL/min   Comment: (NOTE)     The eGFR has been calculated using the CKD EPI equation.     This calculation has not been validated in all clinical situations.     eGFR's persistently <90 mL/min signify possible Chronic Kidney     Disease.  CBC     Status: Abnormal   Collection Time    03/31/14  5:18 AM      Result Value Ref Range   WBC 21.3 (*) 4.0 - 10.5 K/uL   RBC 2.70 (*) 4.22 - 5.81 MIL/uL   Hemoglobin 8.4 (*) 13.0 - 17.0 g/dL   HCT 25.6 (*) 39.0 - 52.0 %   MCV 94.8  78.0 - 100.0 fL   MCH 31.1  26.0 - 34.0 pg   MCHC 32.8  30.0 - 36.0 g/dL   RDW 14.6  11.5 - 15.5 %   Platelets 579 (*) 150 - 400 K/uL    Imaging / Studies: Ct Abdomen Pelvis W Contrast  03/30/2014   CLINICAL DATA:  Abdominal pain and nausea. Leukocytosis. Rectal cancer. Bowel ischemia.  EXAM: CT ABDOMEN AND PELVIS WITH CONTRAST  TECHNIQUE: Multidetector CT imaging of the abdomen and pelvis was performed using the standard protocol following bolus administration of intravenous contrast.  CONTRAST:  136m OMNIPAQUE IOHEXOL 300 MG/ML SOLN,  58mOMNIPAQUE IOHEXOL 300 MG/ML SOLN  COMPARISON:  01/09/2014  FINDINGS: Diffuse micro nodularity in the lung bases. Bands of atelectasis in the right middle lobe and both lower lobes.  No compelling evidence of metastatic disease to the liver. The spleen, gallbladder, pancreas, and adrenal glands appear normal. A surgical drain extends from the right upper quadrant down the right paracolic gutter and into the lower pelvis in the vicinity of the prior low anterior resection.  Left transverse colostomy.  Right-sided enterostomy.  Kidneys and proximal ureters unremarkable. No ureteral calculus observed.  Retro aortic left renal vein. Small porta hepatis lymph nodes are present.  Laparotomy wound noted.  Low grade stranding  in the omentum.  Distended urinary bladder. Mildly dilated scattered loops of small bowel appear to extend to the right-sided enterostomy site; the small bowel distal to this is of very small caliber.  No definite abscess identified. There is abnormal subcutaneous edema along both hip margins and extending along the anterior upper thigh musculature bilaterally.  Markedly severe arthropathy of the left hip is present with deformity in the acetabulum and left femoral head.  IMPRESSION: 1. Mild small bowel dilatation with air-fluid levels, extending to the right-sided enterostomy. There may be some low grade partial obstruction due to tethering in this vicinity. 2. Diffuse micro nodularity in the lung bases. Given the context, this probably reflects an atypical infectious process. There is also mild atelectasis in both lung bases. 3. Surgical drain from the right upper quadrant to terminate at the site of the low anterior resection. 4. There is also a left transverse colostomy. The small bowel and colon between the antrostomy and colostomy is decompressed. 5. Low grade inflammatory stranding in the omentum, without abscess or overt ascites. 6. Severe chronic arthropathy of the left hip.    Electronically Signed   By: Sherryl Barters M.D.   On: 03/30/2014 20:35    Medications / Allergies: per chart  Antibiotics: Anti-infectives   Start     Dose/Rate Route Frequency Ordered Stop   03/30/14 2200  fluconazole (DIFLUCAN) IVPB 400 mg     400 mg 100 mL/hr over 120 Minutes Intravenous Every 24 hours 03/30/14 1605 04/13/14 2159   03/27/14 0000  micafungin (MYCAMINE) 100 mg in sodium chloride 0.9 % 100 mL IVPB  Status:  Discontinued     100 mg 100 mL/hr over 1 Hours Intravenous Daily at bedtime 03/26/14 2356 03/30/14 1521   03/25/14 0800  cefTRIAXone (ROCEPHIN) 2 g in dextrose 5 % 50 mL IVPB  Status:  Discontinued    Comments:  Pharmacy may adjust dosing strength / duration / interval for maximal efficacy   2 g 100 mL/hr over 30 Minutes Intravenous Every 24 hours 03/24/14 1655 03/24/14 2102   03/24/14 2200  piperacillin-tazobactam (ZOSYN) IVPB 3.375 g     3.375 g 12.5 mL/hr over 240 Minutes Intravenous 3 times per day 03/24/14 2133 04/10/14 0745   03/24/14 2000  metroNIDAZOLE (FLAGYL) IVPB 500 mg  Status:  Discontinued     500 mg 100 mL/hr over 60 Minutes Intravenous Every 6 hours 03/24/14 1655 03/26/14 1824   03/24/14 0730  [MAR Hold]  cefoTEtan (CEFOTAN) 2 g in dextrose 5 % 50 mL IVPB     (On MAR Hold since 03/24/14 1125)  Comments:  Pharmacy may adjust dose strength for optimal dosing.   Send with patient on call to the OR.  Anesthesia to complete antibiotic administration <44mn [facility]   2 g 100 mL/hr over 30 Minutes Intravenous On call to O.R. 03/24/14 0428708/04/15 1232   03/23/14 0830  cefTRIAXone (ROCEPHIN) 2 g in dextrose 5 % 50 mL IVPB  Status:  Discontinued    Comments:  Pharmacy may adjust dosing strength / duration / interval for maximal efficacy   2 g 100 mL/hr over 30 Minutes Intravenous Every 24 hours 03/23/14 0738 03/24/14 1655   03/23/14 0800  metroNIDAZOLE (FLAGYL) IVPB 500 mg  Status:  Discontinued     500 mg 100 mL/hr over 60  Minutes Intravenous Every 6 hours 03/23/14 0738 03/24/14 1655   03/20/14 2359  cefoTEtan (CEFOTAN) 2 g in dextrose 5 % 50 mL  IVPB     2 g 100 mL/hr over 30 Minutes Intravenous Every 12 hours 03/20/14 1704 03/20/14 2345   03/20/14 1228  clindamycin (CLEOCIN) 900 mg, gentamicin (GARAMYCIN) 240 mg in sodium chloride 0.9 % 1,000 mL for intraperitoneal lavage  Status:  Discontinued       As needed 03/20/14 1228 03/20/14 1329   03/20/14 0518  cefoTEtan (CEFOTAN) 2 g in dextrose 5 % 50 mL IVPB     2 g 100 mL/hr over 30 Minutes Intravenous On call to O.R. 03/20/14 0518 03/20/14 1259   03/20/14 0000  clindamycin (CLEOCIN) 900 mg, gentamicin (GARAMYCIN) 240 mg in sodium chloride 0.9 % 1,000 mL for intraperitoneal lavage  Status:  Discontinued    Comments:  Pharmacy may adjust dosing strength, schedule, rate of infusion, etc as needed to optimize therapy    Intraperitoneal To Surgery 03/19/14 1341 03/20/14 1453   03/19/14 1345  clindamycin (CLEOCIN) 900 mg, gentamicin (GARAMYCIN) 240 mg in sodium chloride 0.9 % 1,000 mL for intraperitoneal lavage  Status:  Discontinued    Comments:  Pharmacy may adjust dosing strength, schedule, rate of infusion, etc as needed to optimize therapy   1 application Intraperitoneal To Surgery 03/19/14 1339 03/19/14 1341       Note: Portions of this report may have been transcribed using voice recognition software. Every effort was made to ensure accuracy; however, inadvertent computerized transcription errors may be present.   Any transcriptional errors that result from this process are unintentional.

## 2014-03-31 NOTE — Progress Notes (Signed)
Clinical Social Work Department BRIEF PSYCHOSOCIAL ASSESSMENT 03/31/2014  Patient:  [name], [name]     Account Number:  [id number]     Admit date:  03/20/2014  Clinical Social Worker:  Lacie Scotts  Date/Time:  03/31/2014 02:50 PM  Referred by:  Physician  Date Referred:  03/31/2014 Referred for  SNF Placement   Other Referral:   Interview type:  Patient Other interview type:    PSYCHOSOCIAL DATA Living Status:  WIFE Admitted from facility:   Level of care:   Primary support name:  [name] Primary support relationship to patient:  SPOUSE Degree of support available:   supportive    CURRENT CONCERNS Current Concerns  Post-Acute Placement   Other Concerns:    SOCIAL WORK ASSESSMENT / PLAN Pt is a 58 yr old gentleman living at home with spouse prior to hospitalization. CSW met with pt to assist with d/c planning. PN reviewed. MD / PT recommending SNF placement at this time. Pt is in agreement with this plan. Pt does not have insurance . Case reviewed with CSW Director, [facility]. Clinicals have been sent to [facility] SNF's , [facility] and [facility] for review. CSW will continue to follow to assist with d/c planning.   Assessment/plan status:  Psychosocial Support/Ongoing Assessment of Needs Other assessment/ plan:   Information/referral to community resources:   Pt does not have insurance. CSW explained that SNF choices will be limited due to this.    PATIENT'S/FAMILY'S RESPONSE TO PLAN OF CARE: " I'm starting to feel like I'm going to survive. For a while I wasn't sure."  Pt is motivated to work with PT. He is interested in going to a rehab for therapy. " I'll go anywhere for rehab. I just want to get better and go home. "  Pt appreciates CSW assistance .   Werner Lean LCSW [phone]

## 2014-03-31 NOTE — Progress Notes (Signed)
[place] [facility]  Day # 5 zosyn Day #2 fluconazole    5 d  micafungin   Subjective: Had some emesis this morning with trying to take Ensure   Antibiotics:  Anti-infectives   Start     Dose/Rate Route Frequency Ordered Stop   03/30/14 2200  fluconazole (DIFLUCAN) IVPB 400 mg     400 mg 100 mL/hr over 120 Minutes Intravenous Every 24 hours 03/30/14 1605 04/13/14 2159   03/27/14 0000  micafungin (MYCAMINE) 100 mg in sodium chloride 0.9 % 100 mL IVPB  Status:  Discontinued     100 mg 100 mL/hr over 1 Hours Intravenous Daily at bedtime 03/26/14 2356 03/30/14 1521   03/25/14 0800  cefTRIAXone (ROCEPHIN) 2 g in dextrose 5 % 50 mL IVPB  Status:  Discontinued    Comments:  Pharmacy may adjust dosing strength / duration / interval for maximal efficacy   2 g 100 mL/hr over 30 Minutes Intravenous Every 24 hours 03/24/14 1655 03/24/14 2102   03/24/14 2200  piperacillin-tazobactam (ZOSYN) IVPB 3.375 g     3.375 g 12.5 mL/hr over 240 Minutes Intravenous 3 times per day 03/24/14 2133 04/10/14 0745   03/24/14 2000  metroNIDAZOLE (FLAGYL) IVPB 500 mg  Status:  Discontinued     500 mg 100 mL/hr over 60 Minutes Intravenous Every 6 hours 03/24/14 1655 03/26/14 1824   03/24/14 0730  [MAR Hold]  cefoTEtan (CEFOTAN) 2 g in dextrose 5 % 50 mL IVPB     (On MAR Hold since 03/24/14 1125)  Comments:  Pharmacy may adjust dose strength for optimal dosing.   Send with patient on call to the OR.  Anesthesia to complete antibiotic administration <18min prior to incision per [facility].   2 g 100 mL/hr over 30 Minutes Intravenous On call to O.R. 03/24/14 5625 03/24/14 1232   03/23/14 0830  cefTRIAXone (ROCEPHIN) 2 g in dextrose 5 % 50 mL IVPB  Status:  Discontinued    Comments:  Pharmacy may adjust dosing strength / duration / interval for maximal efficacy   2 g 100 mL/hr over 30 Minutes Intravenous Every 24 hours 03/23/14 0738 03/24/14 1655   03/23/14 0800  metroNIDAZOLE (FLAGYL)  IVPB 500 mg  Status:  Discontinued     500 mg 100 mL/hr over 60 Minutes Intravenous Every 6 hours 03/23/14 0738 03/24/14 1655   03/20/14 2359  cefoTEtan (CEFOTAN) 2 g in dextrose 5 % 50 mL IVPB     2 g 100 mL/hr over 30 Minutes Intravenous Every 12 hours 03/20/14 1704 03/20/14 2345   03/20/14 1228  clindamycin (CLEOCIN) 900 mg, gentamicin (GARAMYCIN) 240 mg in sodium chloride 0.9 % 1,000 mL for intraperitoneal lavage  Status:  Discontinued       As needed 03/20/14 1228 03/20/14 1329   03/20/14 0518  cefoTEtan (CEFOTAN) 2 g in dextrose 5 % 50 mL IVPB     2 g 100 mL/hr over 30 Minutes Intravenous On call to O.R. 03/20/14 0518 03/20/14 1259   03/20/14 0000  clindamycin (CLEOCIN) 900 mg, gentamicin (GARAMYCIN) 240 mg in sodium chloride 0.9 % 1,000 mL for intraperitoneal lavage  Status:  Discontinued    Comments:  Pharmacy may adjust dosing strength, schedule, rate of infusion, etc as needed to optimize therapy    Intraperitoneal To Surgery 03/19/14 1341 03/20/14 1453   03/19/14 1345  clindamycin (CLEOCIN) 900 mg, gentamicin (GARAMYCIN) 240 mg in sodium chloride 0.9 % 1,000 mL for intraperitoneal lavage  Status:  Discontinued    Comments:  Pharmacy may adjust dosing strength, schedule, rate of infusion, etc as needed to optimize therapy   1 application Intraperitoneal To Surgery 03/19/14 1339 03/19/14 1341      Medications: Scheduled Meds: . acetaminophen (TYLENOL) oral liquid 160 mg/5 mL  1,000 mg Oral TID WC & HS  . antiseptic oral rinse  7 mL Mouth Rinse BID  . aspirin EC  81 mg Oral Daily  . chlorhexidine  15 mL Mouth Rinse BID  . feeding supplement (ENSURE COMPLETE)  237 mL Oral BID BM  . fluconazole (DIFLUCAN) IV  400 mg Intravenous Q24H  . furosemide  40 mg Oral BID  . heparin subcutaneous  5,000 Units Subcutaneous 3 times per day  . lactated ringers  1,000 mL Intravenous Once  . lip balm  1 application Topical BID  . multivitamins with iron  1 tablet Oral Daily  . pantoprazole  40  mg Oral BID  . piperacillin-tazobactam (ZOSYN)  IV  3.375 g Intravenous 3 times per day  . saccharomyces boulardii  250 mg Oral BID  . sodium chloride  3 mL Intravenous Q12H  . sodium chloride  3 mL Intravenous Q12H  . vitamin C  500 mg Oral BID   Continuous Infusions:  PRN Meds:.sodium chloride, bismuth subsalicylate, diphenhydrAMINE, fentaNYL, lactated ringers, lip balm, methocarbamol (ROBAXIN) IV, ondansetron (ZOFRAN) IV, ondansetron (ZOFRAN) IV, oxyCODONE, phenol, promethazine, sodium chloride, sodium chloride, sodium chloride, sodium chloride    Objective: Weight change: -17 lb 1.4 oz (-7.751 kg)  Intake/Output Summary (Last 24 hours) at 03/31/14 1328 Last data filed at 03/31/14 1045  Gross per 24 hour  Intake    450 ml  Output   3015 ml  Net  -2565 ml   Blood pressure 109/73, pulse 83, temperature 98.8 F (37.1 C), temperature source Oral, resp. rate 18, height 5\' 5"  (1.651 m), weight 169 lb 3.2 oz (76.749 kg), SpO2 96.00%. Temp:  [98.6 F (37 C)-99.1 F (37.3 C)] 98.8 F (37.1 C) (08/11 1005) Pulse Rate:  [82-93] 83 (08/11 1005) Resp:  [18-28] 18 (08/11 1005) BP: (109-168)/(73-92) 109/73 mmHg (08/11 1005) SpO2:  [93 %-98 %] 96 % (08/11 1005) Weight:  [169 lb 3.2 oz (76.749 kg)] 169 lb 3.2 oz (76.749 kg) (08/11 0459)  Physical Exam: General: Alert and awake, oriented x3, not in any acute distress. HEENT: anicteric sclera, pupils reactive to light and accommodation, EOMI CVS regular rate, normal r,  no murmur rubs or gallops Chest: clear to auscultation bilaterally, no wheezing, rales or rhonchi Abdomen: ostomy sites are clean Extremities: no  clubbing or edema noted bilaterally Neuro: nonfocal  CBC:  CBC Latest Ref Rng 03/31/2014 03/29/2014 03/28/2014  WBC 4.0 - 10.5 K/uL 21.3(H) 34.3(H) 38.3(H)  Hemoglobin 13.0 - 17.0 g/dL 8.4(L) 7.9(L) 8.2(L)  Hematocrit 39.0 - 52.0 % 25.6(L) 23.7(L) 24.4(L)  Platelets 150 - 400 K/uL 579(H) 442(H) 357        BMET  Recent  Labs  03/31/14 0518  K 3.9  CREATININE 0.89     Liver Panel  No results found for this basename: PROT, ALBUMIN, AST, ALT, ALKPHOS, BILITOT, BILIDIR, IBILI,  in the last 72 hours     Sedimentation Rate No results found for this basename: ESRSEDRATE,  in the last 72 hours C-Reactive Protein No results found for this basename: CRP,  in the last 72 hours  Micro Results: Recent Results (from the past 720 hour(s))  MRSA PCR SCREENING     Status: None  Collection Time    03/24/14  5:00 PM      Result Value Ref Range Status   MRSA by PCR NEGATIVE  NEGATIVE Final   Comment:            The GeneXpert MRSA Assay (FDA     approved for NASAL specimens     only), is one component of a     comprehensive MRSA colonization     surveillance program. It is not     intended to diagnose MRSA     infection nor to guide or     monitor treatment for     MRSA infections.  CULTURE, BLOOD (ROUTINE X 2)     Status: None   Collection Time    03/24/14  9:50 PM      Result Value Ref Range Status   Specimen Description BLOOD R FOOT   Final   Special Requests BOTTLES DRAWN AEROBIC AND ANAEROBIC 5 CC EACH   Final   Culture  Setup Time     Final   Value: 03/25/2014 00:20     Performed at [facility]   Culture     Final   Value: CANDIDA ALBICANS     Note: Gram Stain Report Called to,Read Back By and Verified With: Darrin Nipper ON 03/26/2014 AT 8:00P BY WILEJ     Performed at [facility]   Report Status 03/28/2014 FINAL   Final  CULTURE, BLOOD (ROUTINE X 2)     Status: None   Collection Time    03/24/14 10:00 PM      Result Value Ref Range Status   Specimen Description BLOOD L FOOT   Final   Special Requests BOTTLES DRAWN AEROBIC AND ANAEROBIC 5 CC EACH   Final   Culture  Setup Time     Final   Value: 03/25/2014 00:20     Performed at [facility]   Culture     Final   Value: NO GROWTH 5 DAYS     Performed at [facility]   Report Status 03/31/2014 FINAL    Final  CULTURE, RESPIRATORY (NON-EXPECTORATED)     Status: None   Collection Time    03/25/14  9:20 AM      Result Value Ref Range Status   Specimen Description ENDOTRACHEAL   Final   Special Requests NONE   Final   Gram Stain     Final   Value: FEW WBC PRESENT, PREDOMINANTLY PMN     NO SQUAMOUS EPITHELIAL CELLS SEEN     FEW YEAST     Performed at [facility]   Culture     Final   Value: FEW PSEUDOMONAS AERUGINOSA     ABUNDANT CANDIDA ALBICANS     Performed at [facility]   Report Status 03/28/2014 FINAL   Final   Organism ID, Bacteria PSEUDOMONAS AERUGINOSA   Final  CULTURE, BLOOD (ROUTINE X 2)     Status: None   Collection Time    03/27/14  9:50 AM      Result Value Ref Range Status   Specimen Description BLOOD R HAND   Final   Special Requests BOTTLES DRAWN AEROBIC AND ANAEROBIC 5CC   Final   Culture  Setup Time     Final   Value: 03/27/2014 12:42     Performed at [facility]   Culture     Final   Value:  BLOOD CULTURE RECEIVED NO GROWTH TO DATE CULTURE WILL BE HELD FOR 5 DAYS BEFORE ISSUING A FINAL NEGATIVE REPORT     Performed at [facility]   Report Status PENDING   Incomplete  CULTURE, BLOOD (ROUTINE X 2)     Status: None   Collection Time    03/27/14  9:53 AM      Result Value Ref Range Status   Specimen Description BLOOD R HAND   Final   Special Requests BOTTLES DRAWN AEROBIC ONLY 6CC   Final   Culture  Setup Time     Final   Value: 03/27/2014 12:42     Performed at [facility]   Culture     Final   Value:        BLOOD CULTURE RECEIVED NO GROWTH TO DATE CULTURE WILL BE HELD FOR 5 DAYS BEFORE ISSUING A FINAL NEGATIVE REPORT     Performed at [facility]   Report Status PENDING   Incomplete  CATH TIP CULTURE     Status: None   Collection Time    03/28/14 12:31 AM      Result Value Ref Range Status   Specimen Description CATH TIP   Final   Special Requests NONE   Final   Culture     Final   Value: NO  GROWTH 2 DAYS     Performed at [facility]   Report Status 03/30/2014 FINAL   Final  CULTURE, BLOOD (ROUTINE X 2)     Status: None   Collection Time    03/30/14  4:43 PM      Result Value Ref Range Status   Specimen Description BLOOD LEFT ARM   Final   Special Requests BOTTLES DRAWN AEROBIC AND ANAEROBIC 10CC   Final   Culture  Setup Time     Final   Value: 03/30/2014 19:02     Performed at [facility]   Culture     Final   Value:        BLOOD CULTURE RECEIVED NO GROWTH TO DATE CULTURE WILL BE HELD FOR 5 DAYS BEFORE ISSUING A FINAL NEGATIVE REPORT     Performed at [facility]   Report Status PENDING   Incomplete  CULTURE, BLOOD (ROUTINE X 2)     Status: None   Collection Time    03/30/14  4:52 PM      Result Value Ref Range Status   Specimen Description BLOOD LEFT HAND   Final   Special Requests BOTTLES DRAWN AEROBIC ONLY 3CC   Final   Culture  Setup Time     Final   Value: 03/30/2014 19:01     Performed at [facility]   Culture     Final   Value:        BLOOD CULTURE RECEIVED NO GROWTH TO DATE CULTURE WILL BE HELD FOR 5 DAYS BEFORE ISSUING A FINAL NEGATIVE REPORT     Performed at [facility]   Report Status PENDING   Incomplete    Studies/Results: Ct Abdomen Pelvis W Contrast  03/30/2014   CLINICAL DATA:  Abdominal pain and nausea. Leukocytosis. Rectal cancer. Bowel ischemia.  EXAM: CT ABDOMEN AND PELVIS WITH CONTRAST  TECHNIQUE: Multidetector CT imaging of the abdomen and pelvis was performed using the standard protocol following bolus administration of intravenous contrast.  CONTRAST:  166mL OMNIPAQUE IOHEXOL 300 MG/ML SOLN, 30mL OMNIPAQUE IOHEXOL 300 MG/ML SOLN  COMPARISON:  01/09/2014  FINDINGS:  Diffuse micro nodularity in the lung bases. Bands of atelectasis in the right middle lobe and both lower lobes.  No compelling evidence of metastatic disease to the liver. The spleen, gallbladder, pancreas, and adrenal glands appear normal. A  surgical drain extends from the right upper quadrant down the right paracolic gutter and into the lower pelvis in the vicinity of the prior low anterior resection.  Left transverse colostomy.  Right-sided enterostomy.  Kidneys and proximal ureters unremarkable. No ureteral calculus observed.  Retro aortic left renal vein. Small porta hepatis lymph nodes are present.  Laparotomy wound noted.  Low grade stranding in the omentum.  Distended urinary bladder. Mildly dilated scattered loops of small bowel appear to extend to the right-sided enterostomy site; the small bowel distal to this is of very small caliber.  No definite abscess identified. There is abnormal subcutaneous edema along both hip margins and extending along the anterior upper thigh musculature bilaterally.  Markedly severe arthropathy of the left hip is present with deformity in the acetabulum and left femoral head.  IMPRESSION: 1. Mild small bowel dilatation with air-fluid levels, extending to the right-sided enterostomy. There may be some low grade partial obstruction due to tethering in this vicinity. 2. Diffuse micro nodularity in the lung bases. Given the context, this probably reflects an atypical infectious process. There is also mild atelectasis in both lung bases. 3. Surgical drain from the right upper quadrant to terminate at the site of the low anterior resection. 4. There is also a left transverse colostomy. The small bowel and colon between the antrostomy and colostomy is decompressed. 5. Low grade inflammatory stranding in the omentum, without abscess or overt ascites. 6. Severe chronic arthropathy of the left hip.   Electronically Signed   By: Sherryl Barters M.D.   On: 03/30/2014 20:35      Assessment/Plan:  Principal Problem:   Rectal cancer, uT2uN0, s/p LAR/coloanal/ileostomy 03/20/2014 Active Problems:   Tobacco abuse, in remission   GERD (gastroesophageal reflux disease)   Obesity (BMI 30-39.9)   ards   Aspiration  pneumonia    [name] is a 58 y.o. male with 58 y.o. male with rectal cancer sp robotic assisted low anterior resection and diverting loop ileostomy on 03/20/14. Complicated by ischemia and necorsis of colonanal anastomosis sp Takedown of coloanal anastomosis Distal colectomy & end colostomy Wound vac placement with respiratory failure sp reintubation, now found to have candidemia he also now has some new floaters in his left visual field.  #1 Candidemia:   --Given this is a Candida albicans we can repeat with  high-dose fluconazole, and this could be converted to oral fluconazole as he improves po intake  --Greatly appreciate Dr. Carlis Abbott seeing the patient and rulling out Candida endophthalmitis --PICC is out,     --Repeat blood cultures after removal of PICC line are NGTD - he will require at least 2 weeks of post PICC line for antifungal therapy = LAST DAY OF FLUCONAZOLE 04/19/14    #2 Health care associated pneumonia:  Pseudomonas and on one culture from the fifth sensitive to Zosyn patient will be 14 days of effective anti-pseudomonal therapy, he needs 6 more days if switching to an oral regimen  Could go to [facility] but would not do this until he is ready for DC from the hospital to minimize Fluoroquinolone exposure  #3 Worsening leukocytosis: This is now continued to improve. CT of the abdomen failed to show any new pathology suggestive of infection though there was question  of partial obstruction   #4 screening  HIV negative and hepatitis panel negative  #5 Posterior vitreal Detachment: watch for signs of retinal detachment and also any visual changes and patient should followup with Dr. Carlis Abbott office number [phone]  We would also be happy to see the patient in followup if needed  Please call [phone] if [facility] desired  I will otherwise sign off at this time and pt has my card.   LOS: 11 days   Alcide Evener 03/31/2014, 1:28 PM

## 2014-04-01 LAB — CREATININE, SERUM
Creatinine, Ser: 1.03 mg/dL (ref 0.50–1.35)
GFR, EST NON AFRICAN AMERICAN: 79 mL/min — AB (ref >90)

## 2014-04-01 LAB — POTASSIUM: POTASSIUM: 3.9 meq/L (ref 3.7–5.3)

## 2014-04-01 LAB — CBC
HCT: 24.9 % — ABNORMAL LOW (ref 39.0–52.0)
Hemoglobin: 8.1 g/dL — ABNORMAL LOW (ref 13.0–17.0)
MCH: 30.3 pg (ref 26.0–34.0)
MCHC: 32.5 g/dL (ref 30.0–36.0)
MCV: 93.3 fL (ref 78.0–100.0)
Platelets: 659 10*3/uL — ABNORMAL HIGH (ref 150–400)
RBC: 2.67 MIL/uL — ABNORMAL LOW (ref 4.22–5.81)
RDW: 14.7 % (ref 11.5–15.5)
WBC: 21.5 10*3/uL — AB (ref 4.0–10.5)

## 2014-04-01 MED ORDER — LACTATED RINGERS IV BOLUS (SEPSIS)
1000.0000 mL | Freq: Once | INTRAVENOUS | Status: AC
  Administered 2014-04-01: 1000 mL via INTRAVENOUS

## 2014-04-01 MED ORDER — BOOST PLUS PO LIQD
237.0000 mL | Freq: Three times a day (TID) | ORAL | Status: DC
  Filled 2014-04-01 (×6): qty 237

## 2014-04-01 NOTE — Evaluation (Signed)
Occupational Therapy Evaluation Patient Details Name: [name] MRN: [id number] DOB: 1956/07/17 Today's Date: 04/01/2014    History of Present Illness 58 yo with rectal cancer s/p LAR/coloanal/ileostomy 03/20/2014 and exploratory lapartomy with takedown of coloanal anastomosis with partial colectomy, colostomy and VAC placement 03/24/14   Clinical Impression   Prior to admission, pt was independent in all ADL and IADL.  He farms. He presents with decreased activity tolerance, generalized weakness, impaired balance and pain interfering with ability to perform self care and ADL transfers.  Limited pt to bed to chair transfer due to low BP and pt having chills. Will follow acutely. Plan is for post acute rehab at a SNF.    Follow Up Recommendations  SNF    Equipment Recommendations       Recommendations for Other Services       Precautions / Restrictions Precautions Precautions: Fall Precaution Comments: colostomy, wound vac Restrictions Weight Bearing Restrictions: No      Mobility Bed Mobility Overal bed mobility: Needs Assistance Bed Mobility: Supine to Sit     Supine to sit: Min assist     General bed mobility comments: encouraged log roll technique  Transfers Overall transfer level: Needs assistance Equipment used: Rolling walker (2 wheeled) Transfers: Stand Pivot Transfers   Stand pivot transfers: Min guard       General transfer comment: moves slowly    Balance                                            ADL Overall ADL's : Needs assistance/impaired Eating/Feeding: Independent;Sitting   Grooming: Set up;Wash/dry hands;Wash/dry face;Sitting;Oral care   Upper Body Bathing: Minimal assitance;Sitting   Lower Body Bathing: Maximal assistance;Sit to/from stand   Upper Body Dressing : Set up;Sitting   Lower Body Dressing: Maximal assistance;Sit to/from stand   Toilet Transfer: Min guard;Stand-pivot;RW (bed to chair)           Functional mobility during ADLs: Min guard;Rolling walker (transferred only today due to diaphoresis, BP) General ADL Comments: Abdominal pain interfering with ability to access feet either to cross foot over opposite knee or bend forward.  Pt has a short reacher is his room.  Instructed in use of reacher for performing LB dressing.     Vision                     Perception     Praxis      Pertinent Vitals/Pain Pain Assessment: 0-10 Pain Score: 4  Pain Location: abdomen Pain Descriptors / Indicators: Aching Pain Intervention(s): Repositioned     Hand Dominance Right   Extremity/Trunk Assessment Upper Extremity Assessment Upper Extremity Assessment: Overall WFL for tasks assessed   Lower Extremity Assessment Lower Extremity Assessment: Generalized weakness LLE Deficits / Details: has decreased ROM and reports decreased strength and leg is shortened from fractures in past       Communication Communication Communication: No difficulties   Cognition Arousal/Alertness: Awake/alert Behavior During Therapy: WFL for tasks assessed/performed Overall Cognitive Status: Within Functional Limits for tasks assessed                     General Comments       Exercises       Shoulder Instructions      Home Living Family/patient expects to be discharged to:: Skilled nursing facility  Prior Functioning/Environment Level of Independence: Independent        Comments: Pt farms.    OT Diagnosis: Generalized weakness;Acute pain   OT Problem List: Decreased strength;Decreased activity tolerance;Impaired balance (sitting and/or standing);Decreased knowledge of use of DME or AE;Cardiopulmonary status limiting activity;Pain   OT Treatment/Interventions: Self-care/ADL training;Energy conservation;DME and/or AE instruction;Patient/family education;Balance training;Therapeutic activities    OT Goals(Current  goals can be found in the care plan section) Acute Rehab OT Goals Patient Stated Goal: eat OT Goal Formulation: With patient Time For Goal Achievement: 04/15/14 Potential to Achieve Goals: Good ADL Goals Pt Will Perform Grooming: with supervision;standing (3 activities) Pt Will Perform Lower Body Bathing: with supervision;with adaptive equipment;sit to/from stand Pt Will Perform Lower Body Dressing: with supervision;sit to/from stand;with adaptive equipment Pt Will Transfer to Toilet: with supervision;ambulating Pt Will Perform Toileting - Clothing Manipulation and hygiene: with supervision;sit to/from stand Additional ADL Goal #1: Pt will perform bed mobility with supervision as a precursor to ADL. Additional ADL Goal #2: Pt will self monitor and employ energy conservation strategies during ADL with supervision.  OT Frequency: Min 2X/week   Barriers to D/C: Decreased caregiver support          Co-evaluation              End of Session    Activity Tolerance: Patient limited by fatigue Patient left: in chair;with call bell/phone within reach;with family/visitor present   Time: 2841-3244 OT Time Calculation (min): 42 min Charges:  OT General Charges $OT Visit: 1 Procedure OT Evaluation $Initial OT Evaluation Tier I: 1 Procedure OT Treatments $Self Care/Home Management : 23-37 mins G-Codes:    Malka So 04/01/2014, 11:49 AM [phone]

## 2014-04-01 NOTE — Progress Notes (Signed)
Called to room per request. Patient  is complaining of feeling flushed, light headed  and diaphoretic. Patient states he was not doing anything other than eating when symptoms occurred. Vital signs taken  BP 97/67, HR-116, R 18, O2 Sat  94% on room air and temp 98.7 orally. Will notify MD and continue to monitor.

## 2014-04-01 NOTE — Progress Notes (Signed)
JP drain removed utilizing mosby's skills/procedure guidelines. Patient tolerated well. Minium  Drainage noted.

## 2014-04-01 NOTE — Progress Notes (Signed)
[address] [place], [place] [address] Phone: [phone] FAX: [place] [phone] 1955-12-03  CARE TEAM:  PCP: Rubbie Battiest, MD  Outpatient Care Team: Patient Care Team: [name], MD as PCP - General (Family Medicine) Satira Sark, MD as Consulting Physician (Cardiology) Danie Binder, MD as Consulting Physician (Gastroenterology) Milus Banister, MD as Attending Physician (Gastroenterology) Farrel Gobble, MD as Consulting Physician (Medical Oncology) Adin Hector, MD as Consulting Physician (General Surgery)  Inpatient Treatment Team: Treatment Team: Attending Provider: Adin Hector, MD; Registered Nurse: Kristopher Oppenheim, RN; Dietitian: Toribio Harbour, RD; Registered Nurse: Nadara Mode, RN; Technician: Rudi Heap, NT; Technician: Leda Quail, NT; Technician: Parks Ranger, NT; Registered Nurse: Eston Mould, RN   Subjective:  Emesis w Ensure yest - better now Hungry Pain controlled  Objective:  Vital signs:  Filed Vitals:   03/31/14 1400 03/31/14 2206 04/01/14 0446 04/01/14 0550  BP: 112/70 111/76  119/82  Pulse: 80 88  83  Temp: 98.6 F (37 C) 98.8 F (37.1 C)  98 F (36.7 C)  TempSrc: Oral Oral  Oral  Resp: _0 Height:      Weight:   173 lb 6.4 oz (78.654 kg)   SpO2: 99% 93%  96%    Last BM Date: 03/29/14  Intake/Output   Yesterday:  08/11 0701 - 08/12 0700 In: 0712 [P.O.:840; I.V.:80; IV Piggyback:300] Out: 1975 [Urine:1900; Drains:130; Stool:400] This shift:     Bowel function:  Flatus: yes  BM: slightly thick effluent in ileostomy bag  Drain: serous  Physical Exam:  General: Pt awake/alert/oriented x4 in no acute distress.  No tired & not toxic. Eyes: PERRL, normal EOM.  Sclera clear.  No icterus Neuro: CN II-XII intact w/o focal sensory/motor deficits. Lymph: No head/neck/groin  lymphadenopathy Psych:  No delerium/psychosis/paranoia HENT: Normocephalic, Mucus membranes moist.  No thrush.  NGT in place Neck: Supple, No tracheal deviation Chest: No chest wall pain w good excursion CV:  Pulses intact.  Regular rhythm MS: Normal AROM mjr joints.  No obvious deformity Abdomen: Soft.  Mod distended.   Ileostomy & end colostomy edematous but viable.  Serous fluid in bag.  No evidence of peritonitis.  No incarcerated hernias.  Wound vac in place & clean Ext:  SCDs BLE.  2+ BLE edema.  No cyanosis Skin: No petechiae / purpura   Problem List:   Principal Problem:   Rectal cancer, uT2uN0, s/p LAR/coloanal/ileostomy 03/20/2014 Active Problems:   Tobacco abuse, in remission   GERD (gastroesophageal reflux disease)   Obesity (BMI 30-39.9)   ards   Aspiration pneumonia   Assessment  [name]  58 y.o. male  8 Days Post-Op    PROCEDURE 03/20/2014:  ROBOTIC ASSISTED low anterior resection  TRANSANAL TRANSABDOMINAL TME WITH HANDSEWN COLOANAL ANASTOMOSIS  splenic flexure mobilization  diverting loop ileostomy  PROCEDURE 03/24/2014:  FLEXIBLE SIGMOIDOSCOPY  Exploratory laparotomy  Takedown of coloanal anastomosis  Distal colectomy & end colostomy  Wound vac     Recovering.  Ileus resolving  Plan:  Agree with IV Zosyn for probable aspiration pneumonitis/PNA.  Switch to PO (?Cipro) at d/c.  Productive cough for the past several days.  Using IS a lot.  That is hopeful.  We will see  Yeast on 1/2 BC & in initial TASP - Fluconazole antifungal coverage per ID. Change to PO at d/c  Ileostomy/colostomy  care.  Check wound - continue wound vac for now.  Follow electrolytes.  Stable K   PPI for GERD.    Pathology T2 N0.  Stage I. Cure rate very high.  Just need to help get him through this stressful period  VTE prophylaxis- SCDs, etc  Placement:  Look at options for SNF for PT/OT/ostomy/wound care - possible d/c to [facility] w rehab  8/13 if he  continues to improve.  D/w SW & pt.  I updated the patient's status to the patient.  Recommendations were made.  He wants me to try & reach his wife whom cannot get to the hospital - will try to call her today.  Questions were answered.  The patient expressed understanding & appreciation.   Adin Hector, M.D., F.A.C.S. Gastrointestinal and Minimally Invasive Surgery [facility] [address] [place], [place] [address] [phone] Main / Paging   04/01/2014   Results:   Labs: Results for orders placed during the hospital encounter of 03/20/14 (from the past 48 hour(s))  CULTURE, BLOOD (ROUTINE X 2)     Status: None   Collection Time    03/30/14  4:43 PM      Result Value Ref Range   Specimen Description BLOOD LEFT ARM     Special Requests BOTTLES DRAWN AEROBIC AND ANAEROBIC 10CC     Culture  Setup Time       Value: 03/30/2014 19:02     Performed at [facility]   Culture       Value:        BLOOD CULTURE RECEIVED NO GROWTH TO DATE CULTURE WILL BE HELD FOR 5 DAYS BEFORE ISSUING A FINAL NEGATIVE REPORT     Performed at [facility]   Report Status PENDING    CULTURE, BLOOD (ROUTINE X 2)     Status: None   Collection Time    03/30/14  4:52 PM      Result Value Ref Range   Specimen Description BLOOD LEFT HAND     Special Requests BOTTLES DRAWN AEROBIC ONLY 3CC     Culture  Setup Time       Value: 03/30/2014 19:01     Performed at [facility]   Culture       Value:        BLOOD CULTURE RECEIVED NO GROWTH TO DATE CULTURE WILL BE HELD FOR 5 DAYS BEFORE ISSUING A FINAL NEGATIVE REPORT     Performed at [facility]   Report Status PENDING    POTASSIUM     Status: None   Collection Time    03/31/14  5:18 AM      Result Value Ref Range   Potassium 3.9  3.7 - 5.3 mEq/L  CREATININE, SERUM     Status: None   Collection Time    03/31/14  5:18 AM      Result Value Ref Range   Creatinine, Ser 0.89  0.50 -  1.35 mg/dL   GFR calc non Af Amer >90  >90 mL/min   GFR calc Af Amer >90  >90 mL/min   Comment: (NOTE)     The eGFR has been calculated using the CKD EPI equation.     This calculation has not been validated in all clinical situations.     eGFR's persistently <90 mL/min signify possible Chronic Kidney     Disease.  CBC     Status: Abnormal  Collection Time    03/31/14  5:18 AM      Result Value Ref Range   WBC 21.3 (*) 4.0 - 10.5 K/uL   RBC 2.70 (*) 4.22 - 5.81 MIL/uL   Hemoglobin 8.4 (*) 13.0 - 17.0 g/dL   HCT 25.6 (*) 39.0 - 52.0 %   MCV 94.8  78.0 - 100.0 fL   MCH 31.1  26.0 - 34.0 pg   MCHC 32.8  30.0 - 36.0 g/dL   RDW 14.6  11.5 - 15.5 %   Platelets 579 (*) 150 - 400 K/uL  POTASSIUM     Status: None   Collection Time    04/01/14  4:59 AM      Result Value Ref Range   Potassium 3.9  3.7 - 5.3 mEq/L  CREATININE, SERUM     Status: Abnormal   Collection Time    04/01/14  4:59 AM      Result Value Ref Range   Creatinine, Ser 1.03  0.50 - 1.35 mg/dL   GFR calc non Af Amer 79 (*) >90 mL/min   GFR calc Af Amer >90  >90 mL/min   Comment: (NOTE)     The eGFR has been calculated using the CKD EPI equation.     This calculation has not been validated in all clinical situations.     eGFR's persistently <90 mL/min signify possible Chronic Kidney     Disease.  CBC     Status: Abnormal   Collection Time    04/01/14  4:59 AM      Result Value Ref Range   WBC 21.5 (*) 4.0 - 10.5 K/uL   RBC 2.67 (*) 4.22 - 5.81 MIL/uL   Hemoglobin 8.1 (*) 13.0 - 17.0 g/dL   HCT 24.9 (*) 39.0 - 52.0 %   MCV 93.3  78.0 - 100.0 fL   MCH 30.3  26.0 - 34.0 pg   MCHC 32.5  30.0 - 36.0 g/dL   RDW 14.7  11.5 - 15.5 %   Platelets 659 (*) 150 - 400 K/uL    Imaging / Studies: Ct Abdomen Pelvis W Contrast  03/30/2014   CLINICAL DATA:  Abdominal pain and nausea. Leukocytosis. Rectal cancer. Bowel ischemia.  EXAM: CT ABDOMEN AND PELVIS WITH CONTRAST  TECHNIQUE: Multidetector CT imaging of the abdomen and  pelvis was performed using the standard protocol following bolus administration of intravenous contrast.  CONTRAST:  134m OMNIPAQUE IOHEXOL 300 MG/ML SOLN, 515mOMNIPAQUE IOHEXOL 300 MG/ML SOLN  COMPARISON:  01/09/2014  FINDINGS: Diffuse micro nodularity in the lung bases. Bands of atelectasis in the right middle lobe and both lower lobes.  No compelling evidence of metastatic disease to the liver. The spleen, gallbladder, pancreas, and adrenal glands appear normal. A surgical drain extends from the right upper quadrant down the right paracolic gutter and into the lower pelvis in the vicinity of the prior low anterior resection.  Left transverse colostomy.  Right-sided enterostomy.  Kidneys and proximal ureters unremarkable. No ureteral calculus observed.  Retro aortic left renal vein. Small porta hepatis lymph nodes are present.  Laparotomy wound noted.  Low grade stranding in the omentum.  Distended urinary bladder. Mildly dilated scattered loops of small bowel appear to extend to the right-sided enterostomy site; the small bowel distal to this is of very small caliber.  No definite abscess identified. There is abnormal subcutaneous edema along both hip margins and extending along the anterior upper thigh musculature bilaterally.  Markedly severe arthropathy of the left  hip is present with deformity in the acetabulum and left femoral head.  IMPRESSION: 1. Mild small bowel dilatation with air-fluid levels, extending to the right-sided enterostomy. There may be some low grade partial obstruction due to tethering in this vicinity. 2. Diffuse micro nodularity in the lung bases. Given the context, this probably reflects an atypical infectious process. There is also mild atelectasis in both lung bases. 3. Surgical drain from the right upper quadrant to terminate at the site of the low anterior resection. 4. There is also a left transverse colostomy. The small bowel and colon between the antrostomy and colostomy is  decompressed. 5. Low grade inflammatory stranding in the omentum, without abscess or overt ascites. 6. Severe chronic arthropathy of the left hip.   Electronically Signed   By: Sherryl Barters M.D.   On: 03/30/2014 20:35    Medications / Allergies: per chart  Antibiotics: Anti-infectives   Start     Dose/Rate Route Frequency Ordered Stop   03/30/14 2200  fluconazole (DIFLUCAN) IVPB 400 mg     400 mg 100 mL/hr over 120 Minutes Intravenous Every 24 hours 03/30/14 1605 04/13/14 2159   03/27/14 0000  micafungin (MYCAMINE) 100 mg in sodium chloride 0.9 % 100 mL IVPB  Status:  Discontinued     100 mg 100 mL/hr over 1 Hours Intravenous Daily at bedtime 03/26/14 2356 03/30/14 1521   03/25/14 0800  cefTRIAXone (ROCEPHIN) 2 g in dextrose 5 % 50 mL IVPB  Status:  Discontinued    Comments:  Pharmacy may adjust dosing strength / duration / interval for maximal efficacy   2 g 100 mL/hr over 30 Minutes Intravenous Every 24 hours 03/24/14 1655 03/24/14 2102   03/24/14 2200  piperacillin-tazobactam (ZOSYN) IVPB 3.375 g     3.375 g 12.5 mL/hr over 240 Minutes Intravenous 3 times per day 03/24/14 2133 04/10/14 1559   03/24/14 2000  metroNIDAZOLE (FLAGYL) IVPB 500 mg  Status:  Discontinued     500 mg 100 mL/hr over 60 Minutes Intravenous Every 6 hours 03/24/14 1655 03/26/14 1824   03/24/14 0730  [MAR Hold]  cefoTEtan (CEFOTAN) 2 g in dextrose 5 % 50 mL IVPB     (On MAR Hold since 03/24/14 1125)  Comments:  Pharmacy may adjust dose strength for optimal dosing.   Send with patient on call to the OR.  Anest[facility] to complete antibiotic administration <[facility]   2 g 100 mL/hr over 30 Minutes Intravenous On call to O.R. 03/24/14 0165708/04/15 1232   03/23/14 0830  cefTRIAXone (ROCEPHIN) 2 g in dextrose 5 % 50 mL IVPB  Status:  Discontinued    Comments:  Pharmacy may adjust dosing strength / duration / interval for maximal efficacy   2 g 100 mL/hr over 30 Minutes Intravenous  Every 24 hours 03/23/14 0738 03/24/14 1655   03/23/14 0800  metroNIDAZOLE (FLAGYL) IVPB 500 mg  Status:  Discontinued     500 mg 100 mL/hr over 60 Minutes Intravenous Every 6 hours 03/23/14 0738 03/24/14 1655   03/20/14 2359  cefoTEtan (CEFOTAN) 2 g in dextrose 5 % 50 mL IVPB     2 g 100 mL/hr over 30 Minutes Intravenous Every 12 hours 03/20/14 1704 03/20/14 2345   03/20/14 1228  clindamycin (CLEOCIN) 900 mg, gentamicin (GARAMYCIN) 240 mg in sodium chloride 0.9 % 1,000 mL for intraperitoneal lavage  Status:  Discontinued       As needed 03/20/14 1228 03/20/14 1329   03/20/14 0518  cefoTEtan (CEFOTAN) 2 g in dextrose 5 % 50 mL IVPB     2 g 100 mL/hr over 30 Minutes Intravenous On call to O.R. 03/20/14 0518 03/20/14 1259   03/20/14 0000  clindamycin (CLEOCIN) 900 mg, gentamicin (GARAMYCIN) 240 mg in sodium chloride 0.9 % 1,000 mL for intraperitoneal lavage  Status:  Discontinued    Comments:  Pharmacy may adjust dosing strength, schedule, rate of infusion, etc as needed to optimize therapy    Intraperitoneal To Surgery 03/19/14 1341 03/20/14 1453   03/19/14 1345  clindamycin (CLEOCIN) 900 mg, gentamicin (GARAMYCIN) 240 mg in sodium chloride 0.9 % 1,000 mL for intraperitoneal lavage  Status:  Discontinued    Comments:  Pharmacy may adjust dosing strength, schedule, rate of infusion, etc as needed to optimize therapy   1 application Intraperitoneal To Surgery 03/19/14 1339 03/19/14 1341       Note: Portions of this report may have been transcribed using voice recognition software. Every effort was made to ensure accuracy; however, inadvertent computerized transcription errors may be present.   Any transcriptional errors that result from this process are unintentional.

## 2014-04-02 ENCOUNTER — Telehealth (INDEPENDENT_AMBULATORY_CARE_PROVIDER_SITE_OTHER): Payer: Self-pay | Admitting: *Deleted

## 2014-04-02 ENCOUNTER — Other Ambulatory Visit: Payer: Self-pay | Admitting: *Deleted

## 2014-04-02 ENCOUNTER — Inpatient Hospital Stay
Admission: RE | Admit: 2014-04-02 | Discharge: 2014-04-05 | Disposition: A | Discharge status: Other Acute Inpatient Hospital | Payer: MEDICAID | Source: Ambulatory Visit | Attending: Internal Medicine | Admitting: Internal Medicine

## 2014-04-02 LAB — CULTURE, BLOOD (ROUTINE X 2)
Culture: NO GROWTH
Culture: NO GROWTH

## 2014-04-02 LAB — POTASSIUM: POTASSIUM: 4.1 meq/L (ref 3.7–5.3)

## 2014-04-02 LAB — CBC
HCT: 25.3 % — ABNORMAL LOW (ref 39.0–52.0)
Hemoglobin: 8.2 g/dL — ABNORMAL LOW (ref 13.0–17.0)
MCH: 30.4 pg (ref 26.0–34.0)
MCHC: 32.4 g/dL (ref 30.0–36.0)
MCV: 93.7 fL (ref 78.0–100.0)
PLATELETS: 795 10*3/uL — AB (ref 150–400)
RBC: 2.7 MIL/uL — ABNORMAL LOW (ref 4.22–5.81)
RDW: 15.1 % (ref 11.5–15.5)
WBC: 21.5 10*3/uL — ABNORMAL HIGH (ref 4.0–10.5)

## 2014-04-02 LAB — CREATININE, SERUM
CREATININE: 0.93 mg/dL (ref 0.50–1.35)
GFR calc Af Amer: >90 mL/min (ref >90)

## 2014-04-02 MED ORDER — OXYCODONE HCL 5 MG PO TABS: ORAL_TABLET | ORAL | Status: DC

## 2014-04-02 MED ORDER — CIPROFLOXACIN HCL 500 MG PO TABS: 500.0000 mg | ORAL_TABLET | Freq: Two times a day (BID) | ORAL | Status: DC

## 2014-04-02 MED ORDER — FLUCONAZOLE 200 MG PO TABS: 200.0000 mg | ORAL_TABLET | Freq: Every day | ORAL | Status: DC

## 2014-04-02 NOTE — Clinical Social Work Note (Signed)
Clinical Social Work Department CLINICAL SOCIAL WORK PLACEMENT NOTE 04/02/2014  Patient:  [name], [name]  Account Number:  [id number] Admit date:  03/20/2014  Clinical Social Worker:  Werner Lean, LCSW  Date/time:  03/31/2014 03:02 PM  Clinical Social Work is seeking post-discharge placement for this patient at the following level of care:   SKILLED NURSING   (*CSW will update this form in Epic as items are completed)     Patient/family provided with [place] Department of Clinical Social Work's list of facilities offering this level of care within the geographic area requested by the patient (or if unable, by the patient's family).  03/31/2014  Patient/family informed of their freedom to choose among providers that offer the needed level of care, that participate in Medicare, Medicaid or managed care program needed by the patient, have an available bed and are willing to accept the patient.  03/31/2014  Patient/family informed of [facility]' ownership interest in [facility], as well as of the fact that they are under no obligation to receive care at this facility.  PASARR submitted to EDS on 03/30/2014 PASARR number received on 03/30/2014  FL2 transmitted to all facilities in geographic area requested by pt/family on  03/31/2014 FL2 transmitted to all facilities within larger geographic area on   Patient informed that his/her managed care company has contracts with or will negotiate with  certain facilities, including the following:     Patient/family informed of bed offers received:  04/01/2014 Patient chooses bed at [facility] Physician recommends and patient chooses bed at    Patient to be transferred to [facility] on  04/02/2014 Patient to be transferred to facility by ptar Patient and family notified of transfer on 04/02/2014 Name of family member notified:  patient and father  The following physician request were entered in  Epic:   Additional Comments: Eduard Clos, MSW, [place]

## 2014-04-02 NOTE — Progress Notes (Signed)
Physical Therapy Treatment Patient Details Name: [name] MRN: [id number] DOB: [birth year] Today's Date: 04/02/2014    History of Present Illness 58 yo with rectal cancer s/p LAR/coloanal/ileostomy 03/20/2014 and exploratory lapartomy with takedown of coloanal anastomosis with partial colectomy, colostomy and VAC placement 03/24/14    PT Comments    Pt OOB in recliner very motivated.  Amb full unit with RW.  Tolerated session well.   Follow Up Recommendations  SNF     Equipment Recommendations  Rolling walker with 5" wheels    Recommendations for Other Services       Precautions / Restrictions Precautions Precautions: Fall Precaution Comments: colostomy, wound vac Restrictions Weight Bearing Restrictions: No    Mobility  Bed Mobility               General bed mobility comments: Pt OOB in recliner  Transfers Overall transfer level: Needs assistance Equipment used: Rolling walker (2 wheeled) Transfers: Sit to/from Stand Sit to Stand: Supervision         General transfer comment: increased time, one VC on hand placement  Ambulation/Gait Ambulation/Gait assistance: Min guard;Supervision Ambulation Distance (Feet): 500 Feet Assistive device: Rolling walker (2 wheeled) Gait Pattern/deviations: Step-through pattern     General Gait Details: tolerated amb full unit.  Very motivated.     Stairs            Wheelchair Mobility    Modified Rankin (Stroke Patients Only)       Balance                                    Cognition                            Exercises      General Comments        Pertinent Vitals/Pain      Home Living                      Prior Function            PT Goals (current goals can now be found in the care plan section)      Frequency  Min 3X/week    PT Plan      Co-evaluation             End of Session Equipment Utilized During Treatment: Gait  belt Activity Tolerance: Patient tolerated treatment well Patient left: in chair;with call bell/phone within reach     Time: 0930-0955 PT Time Calculation (min): 25 min  Charges:  $Gait Training: 8-22 mins $Therapeutic Activity: 8-22 mins                    G Codes:      Rica Koyanagi  PTA [facility]  Acute  Rehab Pager      [phone]

## 2014-04-02 NOTE — Telephone Encounter (Signed)
[facility] 

## 2014-04-02 NOTE — Progress Notes (Signed)
Spoke with MD patient to discontinue the wound vac at discharge. He will need a wet to dry dressing BID at discharge. No other concerns at this time. Report called to [facility].

## 2014-04-02 NOTE — Clinical Social Work Note (Signed)
Patient for d/c today to SNF bed at [facility]- Patient agreeable to this plan- became a little tearful with me regarding all that he has been through- support and encouragement provided. Will plan transfer via EMS. Eduard Clos, MSW, [employer]

## 2014-04-02 NOTE — Telephone Encounter (Signed)
I do not know what that is.  Have them call his primary care physician.

## 2014-04-02 NOTE — Discharge Instructions (Signed)
WOUND CARE  It is important that the wound be kept open.   -Keeping the skin edges apart will allow the wound to gradually heal from the base upwards.   - If the skin edges of the wound close too early, a new fluid pocket can form and infection can occur. -This is the reason to pack deeper wounds with gauze or ribbon -This is why drained wounds cannot be sewed closed right away  A healthy wound should form a lining of bright red "beefy" granulating tissue that will help shrink the wound and help the edges grow new skin into it.   -A little mucus / yellow discharge is normal (the body's natural way to try and form a scab) and should be gently washed off with soap and water with daily dressing changes.  -Green or foul smelling drainage implies bacterial colonization and can slow wound healing - a short course of antibiotic ointment (3-5 days) can help it clear up.  Call the doctor if it does not improve or worsens  -Avoid use of antibiotic ointments for more than a week as they can slow wound healing over time.    -Sometimes other wound care products will be used to reduce need for dressing changes and/or help clean up dirty wounds -Sometimes the surgeon needs to debride the wound in the office to remove dead or infected tissue out of the wound so it can heal more quickly and safely.    Change the dressing at least once a day -Wash the wound with mild soap and water gently every day.  It is good to shower or bathe the wound to help it clean out. -Use clean 4x4 gauze for medium/large wounds or ribbon plain NU-gauze for smaller wounds (it does not need to be sterile, just clean) -Keep the raw wound moist with a little saline or KY (saline) gel on the gauze.  -A dry wound will take longer to heal.  -Keep the skin dry around the wound to prevent breakdown and irritation. -Pack the wound down to the base -The goal is to keep the skin apart, not overpack the wound -Use a Q-tip or blunt-tipped kabob  stick toothpick to push the gauze down to the base in narrow or deep wounds   -Cover with a clean gauze and tape -paper or Medipore tape tend to be gentle on the skin -rotate the orientation of the tape to avoid repeated stress/trauma on the skin -using an ACE or Coban wrap on wounds on arms or legs can be used instead.  Complete all antibiotics through the entire prescription to help the infection heal and prevent new places of infection   Returning the see the surgeon is helpful to follow the healing process and help the wound close as fast as possible.    ABDOMINAL SURGERY: POST OP INSTRUCTIONS  1. DIET: Follow a light bland diet the first 24 hours after arrival home, such as soup, liquids, crackers, etc.  Be sure to include lots of fluids daily.  Avoid fast food or heavy meals as your are more likely to get nauseated.  Eat a low fat the next few days after surgery.   2. Take your usually prescribed home medications unless otherwise directed. 3. PAIN CONTROL: a. Pain is best controlled by a usual combination of three different methods TOGETHER: i. Ice/Heat ii. Over the counter pain medication iii. Prescription pain medication b. Most patients will experience some swelling and bruising around the incisions.  Ice packs or  heating pads (30-60 minutes up to 6 times a day) will help. Use ice for the first few days to help decrease swelling and bruising, then switch to heat to help relax tight/sore spots and speed recovery.  Some people prefer to use ice alone, heat alone, alternating between ice & heat.  Experiment to what works for you.  Swelling and bruising can take several weeks to resolve.   c. It is helpful to take an over-the-counter pain medication regularly for the first few weeks.  Choose one of the following that works best for you: i. Naproxen (Aleve, etc)  Two 231m tabs twice a day ii. Ibuprofen (Advil, etc) Three 2098mtabs four times a day (every meal &  bedtime) iii. Acetaminophen (Tylenol, etc) 500-65032mour times a day (every meal & bedtime) d. A  prescription for pain medication (such as oxycodone, hydrocodone, etc) should be given to you upon discharge.  Take your pain medication as prescribed.  i. If you are having problems/concerns with the prescription medicine (does not control pain, nausea, vomiting, rash, itching, etc), please call us [phone] see if we need to switch you to a different pain medicine that will work better for you and/or control your side effect better. ii. If you need a refill on your pain medication, please contact your pharmacy.  They will contact our office to request authorization. Prescriptions will not be filled after 5 pm or on week-ends. 4. Avoid getting constipated.  Between the surgery and the pain medications, it is common to experience some constipation.  Increasing fluid intake and taking a fiber supplement (such as Metamucil, Citrucel, FiberCon, MiraLax, etc) 1-2 times a day regularly will usually help prevent this problem from occurring.  A mild laxative (prune juice, Milk of Magnesia, MiraLax, etc) should be taken according to package directions if there are no bowel movements after 48 hours.   5. Watch out for diarrhea.  If you have many loose bowel movements, simplify your diet to bland foods & liquids for a few days.  Stop any stool softeners and decrease your fiber supplement.  Switching to mild anti-diarrheal medications (Kayopectate, Pepto Bismol) can help.  If this worsens or does not improve, please call us.[place]. Wash / shower every day.  You may shower over the incision / wound.  Avoid baths until the skin is fully healed.  Continue to shower over incision(s) after the dressing is off. 7. Remove your waterproof bandages 5 days after surgery.  You may leave the incision open to air.  You may replace a dressing/Band-Aid to cover the incision for comfort if you wish. 8. ACTIVITIES as tolerated:    a. You may resume regular (light) daily activities beginning the next day--such as daily self-care, walking, climbing stairs--gradually increasing activities as tolerated.  If you can walk 30 minutes without difficulty, it is safe to try more intense activity such as jogging, treadmill, bicycling, low-impact aerobics, swimming, etc. b. Save the most intensive and strenuous activity for last such as sit-ups, heavy lifting, contact sports, etc  Refrain from any heavy lifting or straining until you are off narcotics for pain control.   c. DO NOT PUSH THROUGH PAIN.  Let pain be your guide: If it hurts to do something, don't do it.  Pain is your body warning you to avoid that activity for another week until the pain goes down. d. You may drive when you are no longer taking prescription pain medication, you can comfortably wear a seatbelt,  and you can safely maneuver your car and apply brakes. e. Dennis Bast may have sexual intercourse when it is comfortable.  9. FOLLOW UP in our office a. Please call [facility] at [phone] to set up an appointment to see your surgeon in the office for a follow-up appointment approximately 1-2 weeks after your surgery. b. Make sure that you call for this appointment the day you arrive home to insure a convenient appointment time. 10. IF YOU HAVE DISABILITY OR FAMILY LEAVE FORMS, BRING THEM TO THE OFFICE FOR PROCESSING.  DO NOT GIVE THEM TO YOUR DOCTOR.   WHEN TO CALL us [phone]: 1. Poor pain control 2. Reactions / problems with new medications (rash/itching, nausea, etc)  3. Fever over 101.5 F (38.5 C) 4. Inability to urinate 5. Nausea and/or vomiting 6. Worsening swelling or bruising 7. Continued bleeding from incision. 8. Increased pain, redness, or drainage from the incision  The clinic staff is available to answer your questions during regular business hours (8:30am-5pm).  Please dont hesitate to call and ask to speak to one of our nurses for clinical concerns.    A surgeon from [facility] is always on call at the hospitals   If you have a medical emergency, go to the nearest emergency room or call 911.    [facility], [address], [place], [place], [place]  [address] ? MAIN: [phone] ? TOLL FREE: [phone] ? FAX [phone] [email or web address]  Ostomy Support Information  Yes, Theresia Majors heard that people get along just fine with only one of their eyes, or one of their lungs, or one of their kidneys. But you also know that you have only one intestine and only one bladder, and that leaves you feeling awfully empty, both physically and emotionally: You think no other people go around without part of their intestine with the ends of their intestines sticking out through their abdominal walls.  Well, you are wrong! There are nearly three quarters of a million people in the [place] who have an ostomy; people who have had surgery to remove all or part of their colons or bladders. There is even a national association, the [place] [employer] [place] with over 350 local affiliated support groups that are organized by volunteers who provide peer support and counseling. Juan Quam has a toll free telephone num-ber, 201-376-1736 and an educational,  interactive website, www.ostomy.org   An ostomy is an opening in the belly (abdominal wall) made by surgery. Ostomates are people who have had this procedure. The opening (stoma) allows the kidney or bowel to discharge waste. An external pouch covers the stoma to collect waste. Pouches are are a simple bag and are odor free. Different companies have disposable or reusable pouches to fit one's lifestyle. An ostomy can either be temporary or permanent.  THERE ARE THREE MAIN TYPES OF OSTOMIES  Colostomy. A colostomy is a surgically created opening in the large intestine (colon).  Ileostomy. An ileostomy is a surgically created opening in the small  intestine.  Urostomy. A urostomy is a surgically created opening to divert urine away from the bladder. FREQUENTLY ASKED QUESTIONS   Why havent you met any of these folks who have an ostomy?  Well, maybe you have! You just did not recognize them because an ostomy doesn't show. It can be kept secret if you wish. Why, maybe some of your best friends, office associates or neighbors have an ostomy ... you never can tell.   People  facing ostomy surgery have many quality-of-life questions like:  Will you bulge? Smell? Make noises? Will you feel waste leaving your body? Will you be a captive of the toilet? Will you starve? Be a social outcast? Get/stay married? Have babies? Easily bathe, go swimming, bend over?  OK, lets look at what you can expect:  Will you bulge?  Remember, without part of the intestine or bladder, and its contents, you should have a flatter tummy than before. You can expect to wear, with little exception, what you wore before surgery ... and this in-cludes tight clothing and bathing suits.  Will you smell?  Today, thanks to modern odor proof pouching systems, you can walk into an ostomy support group meeting and not smell anything that is foul or offensive. And, for those with an ileostomy or colostomy who are concerned about odor when emptying their pouch, there are in-pouch deodorants that can be used to eliminate any waste odors that may exist.  Will you make noises?  Everyone produces gas, especially if they are an air-swallower. But intestinal sounds that occur from time to time are no differ-ent than a gurgling tummy, and quite often your clothing will muffle any sounds.   Will you feel the waste discharges?  For those with a colostomy or ileostomy there might be a slight pressure when waste leaves your body, but understand that the intestines have no nerve endings, so there will be no unpleasant sensations. Those with a urostomy will probably be unaware of any kidney  drainage.  Will you be a captive of the toilet?  Immediately post-op you will spend more time in the bathroom than you will after your body recovers from surgery. Every person is different, but on average those with an ileostomy or urostomy may empty their pouches 4 to 6 times a day; a little  less if you have a colostomy. The average wear time between pouch system changes is 3 to 5 days and the changing process should take less than 30 minutes.  Will I need to be on a special diet? Most people return to their normal diet when they have recovered from surgery. Be sure to chew your food well, eat a well-balanced diet and drink plenty of fluids. If you experience problems with a certain food, wait a couple of weeks and try it again. Will there be odor and noises? Pouching systems are designed to be odor-proof or odor-resistant. There are deodorants that can be used in the pouch. Medications are also available to help reduce odor. Limit gas-producing foods and carbonated beverages. You will experience less gas and fewer noises as you heal from surgery. How much time will it take to care for my ostomy? At first, you may spend a lot of time learning about your ostomy and how to take care of it. As you become more comfortable and skilled at changing the pouching system, it will take very little time to care for it.  Will I be able to return to work? People with ostomies can perform most jobs. As soon as you have healed from surgery, you should be able to return to work. Heavy lifting (more than 10 pounds) may be discouraged.  What about intimacy? Sexual relationships and intimacy are important and fulfilling aspects of your life. They should continue after ostomy surgery. Intimacy-related concerns should be discussed openly between you and your partner.  Can I wear regular clothing? You do not need to wear special clothing. Ostomy pouches are fairly flat  and barely noticeable. Elastic undergarments will not  hurt the stoma or prevent the ostomy from functioning.  Can I participate in sports? An ostomy should not limit your involvement in sports. Many people with ostomies are runners, skiers, swimmers or participate in other active lifestyles. Talk with your caregiver first before doing heavy physical activity.  Will you starve?  Not if you follow doctors orders at each stage of your post-op adjustment. There is no such thing as an ostomy diet. Some people with an ostomy will be able to eat and tolerate anything; others may find diffi-culty with some foods. Each person is an individual and must determine, by trial, what is best for them. A good practice for all is to drink plenty of water.  Will you be a social outcast?  Have you met anyone who has an ostomy and is a social outcast? Why should you be the first? Only your attitude and self image will effect how you are treated. No confi-dent person is an Occupational psychologist.   PROFESSIONAL HELP  Resources are available if you need help or have questions about your ostomy.    Specially trained nurses called Wound, Ostomy Continence Nurses (WOCN) are available for consultation in most major medical centers.   Consider getting an ostomy consult with Cena Benton at [facility] to help troubleshoot stoma pouch fittings and other issues with your ostomy: [phone]   The [employer] Ostomy Association (UOA) is a group made up of many local chapters throughout the [place]. These local groups hold meetings and provide support to prospective and existing ostomates. They sponsor educational events and have qualified visitors to make personal or telephone visits. Contact the UOA for the chapter nearest you and for other educational publications.  More detailed information can be found in Colostomy Guide, a publication of the [employer] (UOA). Contact UOA at 1-361-794-2967 or visit their web site at https://arellano.com/. The website contains links  to other sites, suppliers and resources. Document Released: 08/10/2003 Document Revised: 10/30/2011 Document Reviewed: 12/09/2008 [facility] Patient Information 2013 [place].  Managing Pain  Pain after surgery or related to activity is often due to strain/injury to muscle, tendon, nerves and/or incisions.  This pain is usually short-term and will improve in a few months.   Many people find it helpful to do the following things TOGETHER to help speed the process of healing and to get back to regular activity more quickly:  1. Avoid heavy physical activity a.  no lifting greater than 20 pounds b. Do not push through the pain.  Listen to your body and avoid positions and maneuvers than reproduce the pain c. Walking is okay as tolerated, but go slowly and stop when getting sore.  d. Remember: If it hurts to do it, then dont do it! 2. Take Anti-inflammatory medication  a. Take with food/snack around the clock for 1-2 weeks i. This helps the muscle and nerve tissues become less irritable and calm down faster b. Choose ONE of the following over-the-counter medications: i. Naproxen 220mg  tabs (ex. Aleve) 1-2 pills twice a day  ii. Ibuprofen 200mg  tabs (ex. Advil, Motrin) 3-4 pills with every meal and just before bedtime iii. Acetaminophen 500mg  tabs (Tylenol) 1-2 pills with every meal and just before bedtime 3. Use a Heating pad or Ice/Cold Pack a. 4-6 times a day b. May use warm bath/hottub  or showers 4. Try Gentle Massage and/or Stretching  a. at the area of pain many times a day b.  stop if you feel pain - do not overdo it  Try these steps together to help you body heal faster and avoid making things get worse.  Doing just one of these things may not be enough.    If you are not getting better after two weeks or are noticing you are getting worse, contact our office for further advice; we may need to re-evaluate you & see what other things we can do to help.  Colorectal  Cancer Colorectal cancer is an abnormal growth of tissue (tumor) in the colon or rectum that is cancerous (malignant). Unlike noncancerous (benign) tumors, malignant tumors can spread to other parts of your body. The colon is the large bowel or large intestine. The rectum is the last several inches of the colon.  RISK FACTORS The exact cause of colorectal cancer is unknown. However, the following factors may increase your chances of getting colorectal cancer:   Age older than 50 years.   Abnormal growths (polyps) on the inner wall of the colon or rectum.   Diabetes.   African American race.   Family history of hereditary nonpolyposis colorectal cancer. This condition is caused by changes in the genes that are responsible for repairing mismatched DNA.   Personal history of cancer. A person who has already had colorectal cancer may develop it a second time. Also, women with a history of ovarian, uterine, or breast cancer are at a somewhat higher risk of developing colorectal cancer.  Certain hereditary conditions.  Eating a diet that is high in fat (especially animal fat) and low in fiber, fruits, and vegetables.  Sedentary lifestyle.  Inflammatory bowel disease, including ulcerative colitis and Crohn's disease.   Smoking.   Excessive alcohol use.  SYMPTOMS Early colorectal cancer often does not cause symptoms. As the cancer grows, symptoms may include:   Changes in bowel habits.  Diarrhea.   Constipation.   Feeling like the bowel does not empty completely after a bowel movement.   Blood in the stool.   Stools that are narrower than usual.   Abdominal discomfort, pain, bloating, fullness, or cramps.  Frequent gas pain.   Unexplained weight loss.   Constant tiredness.   Nausea and vomiting.  DIAGNOSIS  Your health care provider will ask about your medical history. He or she may also perform a number of procedures, such as:   A physical exam.  A  digital rectal exam.  A fecal occult blood test.  A barium enema.  Blood tests.   X-rays.   Imaging tests, such as CT scans or MRIs.   Taking a tissue sample (biopsy) from your colon or rectum to look for cancer cells.   A sigmoidoscopy to view the inside of the last part of your colon.   A colonoscopy to view the inside of your entire colon.   An endorectal ultrasound to see how deep a rectal tumor has grown and whether the cancer has spread to lymph nodes or other nearby tissues.  Your cancer will be staged to determine its severity and extent. Staging is a careful attempt to find out the size of the tumor, whether the cancer has spread, and if so, to what parts of the body. You may need to have more tests to determine the stage of your cancer. The test results will help determine what treatment plan is best for you.   Stage 0. The cancer is found only in the innermost lining of the colon or rectum.   Stage I.  The cancer has grown into the inner wall of the colon or rectum. The cancer has not yet reached the outer wall of the colon.   Stage II. The cancer extends more deeply into or through the wall of the colon or rectum. It may have invaded nearby tissue, but cancer cells have not spread to the lymph nodes.   Stage III. The cancer has spread to nearby lymph nodes but not to other parts of the body.   Stage IV. The cancer has spread to other parts of the body, such as the liver or lungs.  Your health care provider may tell you the detailed stage of your cancer, which includes both a number and a letter.  TREATMENT  Depending on the type and stage, colorectal cancer may be treated with surgery, radiation therapy, chemotherapy, targeted therapy, or radiofrequency ablation. Some people have a combination of these therapies. Surgery may be done to remove the polyps from your colon. In early stages, your health care provider may be able to do this during a colonoscopy. In  later stages, surgery may be done to remove part of your colon.  HOME CARE INSTRUCTIONS   Take medicines only as directed by your health care provider.   Maintain a healthy diet.   Consider joining a support group. This may help you learn to cope with the stress of having colorectal cancer.   Seek advice to help you manage treatment of side effects.   Keep all follow-up visits as directed by your health care provider.   Inform your cancer specialist if you are admitted to the hospital.  SEEK MEDICAL CARE IF:  Your diarrhea or constipation does not go away.   Your bowel habits change.  You have increased abdominal pain.   You notice new fatigue or weakness.  You lose weight. Document Released: 08/07/2005 Document Revised: 12/22/2013 Document Reviewed: 01/30/2013 [facility] Patient Information 2015 [place], [place]. This information is not intended to replace advice given to you by your health care provider. Make sure you discuss any questions you have with your health care provider.

## 2014-04-02 NOTE — Telephone Encounter (Signed)
Received fax from [facility] stating that patient needs a hard copy because he didn't bring one from being discharged from facility. Approved by Alene Mires

## 2014-04-02 NOTE — Discharge Summary (Signed)
Physician Discharge Summary  Patient ID: [name] MRN: [id number] DOB/AGE: 02/23/1956 58 y.o.  Admit date: 03/20/2014 Discharge date: 04/02/2014  Admission Diagnoses:  Discharge Diagnoses:  Principal Problem:   Rectal cancer, uT2uN0, s/p LAR/coloanal/ileostomy 03/20/2014 Active Problems:   Tobacco abuse, in remission   GERD (gastroesophageal reflux disease)   Obesity (BMI 30-39.9)   ards   Aspiration pneumonia  POST-OPERATIVE DIAGNOSIS: VERY LOW RECTAL CANCER   03/20/2014 PROCEDURE:  ROBOTIC ASSISTED low anterior resection  TRANSANAL TRANSABDOMINAL TME WITH HANDSEWN COLOANAL ANASTOMOSIS  splenic flexure mobilization  diverting loop ileostomy   Surgeon(s):  Adin Hector, MD  Leighton Ruff, MD - Asst   POST-OPERATIVE DIAGNOSIS:  Massive ileus  Ischemia/necrosis of distal colon at coloanal anastomosis   03/24/2014  PROCEDURE:  FLEXIBLE SIGMOIDOSCOPY  Exploratory laparotomy  Takedown of coloanal anastomosis  Distal colectomy & end colostomy  Wound vac  SURGEON: Surgeon(s):  Adin Hector, MD      Discharged Condition: fair  Hospital Course: The patient underwent the surgery above.  Postoperatively, the patient gradually mobilized in the hallways and advanced diet.  Pain and other symptoms  were treated aggressively.   He developed persistent tachycardia and ileus.  No evidence of infection elsewhere.  I took him back the operating room and endoscopy revealed severe ischemia/necrosis of the anastomosis.  I resected anastomosis and converted to a colostomy.  Just prior to the operation he had an episode of massive emesis and aspiration.  He was intubated in the intensive care unit for 48 hours.  Aggressive pulmonary toilet.  Critical care consultation.  He shock resolved.  He extubated.  He transitioned to the floor.  His ileus resolved.  He was advanced on his diet.  He had close wound end ileostomy care.  He had evidence of candida on one blood culture and  evidence of Pseudomonas in his tracheal aspirate.  Infectious disease consultation involved.  He was treated with appropriate antibiotics.  By the time of discharge, the patient was walking well the hallways, eating food well, having flatus.  Pain was well-controlled on an oral regimen.  Based on meeting discharge criteria and continuing to recover, I felt it was safe for the patient to be discharged from the hospital with close followup.  Given his fair mobility and deconditioning, it was felt by myself, nursing, and physical/occupational therapy that he would benefit from rehabilitation and skilled facility for a short time.  That has been arranged.  Instructions were discussed in detail.  They are written as well.    Consults: pulmonary/intensive care and ID  Significant Diagnostic Studies:   Results for orders placed during the hospital encounter of 03/20/14 (from the past 72 hour(s))  CULTURE, BLOOD (ROUTINE X 2)     Status: None   Collection Time    03/30/14  4:43 PM      Result Value Ref Range   Specimen Description BLOOD LEFT ARM     Special Requests BOTTLES DRAWN AEROBIC AND ANAEROBIC 10CC     Culture  Setup Time       Value: 03/30/2014 19:02     Performed at [facility]   Culture       Value:        BLOOD CULTURE RECEIVED NO GROWTH TO DATE CULTURE WILL BE HELD FOR 5 DAYS BEFORE ISSUING A FINAL NEGATIVE REPORT     Performed at [facility]   Report Status PENDING    CULTURE, BLOOD (ROUTINE X 2)  Status: None   Collection Time    03/30/14  4:52 PM      Result Value Ref Range   Specimen Description BLOOD LEFT HAND     Special Requests BOTTLES DRAWN AEROBIC ONLY 3CC     Culture  Setup Time       Value: 03/30/2014 19:01     Performed at [facility]   Culture       Value:        BLOOD CULTURE RECEIVED NO GROWTH TO DATE CULTURE WILL BE HELD FOR 5 DAYS BEFORE ISSUING A FINAL NEGATIVE REPORT     Performed at [facility]   Report Status  PENDING    POTASSIUM     Status: None   Collection Time    03/31/14  5:18 AM      Result Value Ref Range   Potassium 3.9  3.7 - 5.3 mEq/L  CREATININE, SERUM     Status: None   Collection Time    03/31/14  5:18 AM      Result Value Ref Range   Creatinine, Ser 0.89  0.50 - 1.35 mg/dL   GFR calc non Af Amer >90  >90 mL/min   GFR calc Af Amer >90  >90 mL/min   Comment: (NOTE)     The eGFR has been calculated using the CKD EPI equation.     This calculation has not been validated in all clinical situations.     eGFR's persistently <90 mL/min signify possible Chronic Kidney     Disease.  CBC     Status: Abnormal   Collection Time    03/31/14  5:18 AM      Result Value Ref Range   WBC 21.3 (*) 4.0 - 10.5 K/uL   RBC 2.70 (*) 4.22 - 5.81 MIL/uL   Hemoglobin 8.4 (*) 13.0 - 17.0 g/dL   HCT 25.6 (*) 39.0 - 52.0 %   MCV 94.8  78.0 - 100.0 fL   MCH 31.1  26.0 - 34.0 pg   MCHC 32.8  30.0 - 36.0 g/dL   RDW 14.6  11.5 - 15.5 %   Platelets 579 (*) 150 - 400 K/uL  POTASSIUM     Status: None   Collection Time    04/01/14  4:59 AM      Result Value Ref Range   Potassium 3.9  3.7 - 5.3 mEq/L  CREATININE, SERUM     Status: Abnormal   Collection Time    04/01/14  4:59 AM      Result Value Ref Range   Creatinine, Ser 1.03  0.50 - 1.35 mg/dL   GFR calc non Af Amer 79 (*) >90 mL/min   GFR calc Af Amer >90  >90 mL/min   Comment: (NOTE)     The eGFR has been calculated using the CKD EPI equation.     This calculation has not been validated in all clinical situations.     eGFR's persistently <90 mL/min signify possible Chronic Kidney     Disease.  CBC     Status: Abnormal   Collection Time    04/01/14  4:59 AM      Result Value Ref Range   WBC 21.5 (*) 4.0 - 10.5 K/uL   RBC 2.67 (*) 4.22 - 5.81 MIL/uL   Hemoglobin 8.1 (*) 13.0 - 17.0 g/dL   HCT 24.9 (*) 39.0 - 52.0 %   MCV 93.3  78.0 - 100.0 fL   MCH 30.3  26.0 -  34.0 pg   MCHC 32.5  30.0 - 36.0 g/dL   RDW 14.7  11.5 - 15.5 %    Platelets 659 (*) 150 - 400 K/uL  POTASSIUM     Status: None   Collection Time    04/02/14  5:09 AM      Result Value Ref Range   Potassium 4.1  3.7 - 5.3 mEq/L  CREATININE, SERUM     Status: None   Collection Time    04/02/14  5:09 AM      Result Value Ref Range   Creatinine, Ser 0.93  0.50 - 1.35 mg/dL   GFR calc non Af Amer >90  >90 mL/min   GFR calc Af Amer >90  >90 mL/min   Comment: (NOTE)     The eGFR has been calculated using the CKD EPI equation.     This calculation has not been validated in all clinical situations.     eGFR's persistently <90 mL/min signify possible Chronic Kidney     Disease.  CBC     Status: Abnormal   Collection Time    04/02/14  5:09 AM      Result Value Ref Range   WBC 21.5 (*) 4.0 - 10.5 K/uL   RBC 2.70 (*) 4.22 - 5.81 MIL/uL   Hemoglobin 8.2 (*) 13.0 - 17.0 g/dL   HCT 25.3 (*) 39.0 - 52.0 %   MCV 93.7  78.0 - 100.0 fL   MCH 30.4  26.0 - 34.0 pg   MCHC 32.4  30.0 - 36.0 g/dL   RDW 15.1  11.5 - 15.5 %   Platelets 795 (*) 150 - 400 K/uL    Discharge Exam: Blood pressure 115/78, pulse 91, temperature 98.9 F (37.2 C), temperature source Oral, resp. rate 18, height '5\' 5"'  (1.651 m), weight 174 lb 1.6 oz (78.971 kg), SpO2 91.00%.  General: Pt awake/alert/oriented x4 in no acute distress. No tired & not toxic.  Eyes: PERRL, normal EOM. Sclera clear. No icterus  Neuro: CN II-XII intact w/o focal sensory/motor deficits.  Lymph: No head/neck/groin lymphadenopathy  Psych: No delerium/psychosis/paranoia  HENT: Normocephalic, Mucus membranes moist. No thrush. NGT in place  Neck: Supple, No tracheal deviation  Chest: No chest wall pain w good excursion  CV: Pulses intact. Regular rhythm  MS: Normal AROM mjr joints. No obvious deformity  Abdomen: Soft. Mod distended. Ileostomy & end colostomy edematous but viable. Bilious effluent & flatus in bag. No evidence of peritonitis. No incarcerated hernias. Wound vac in place & clean  Ext: SCDs BLE. No edema.  No cyanosis  Skin: No petechiae / purpura      Disposition: 01-Home or Self Care  Discharge Instructions   Call MD for:  extreme fatigue    Complete by:  As directed      Call MD for:  extreme fatigue    Complete by:  As directed      Call MD for:  hives    Complete by:  As directed      Call MD for:  hives    Complete by:  As directed      Call MD for:  persistant nausea and vomiting    Complete by:  As directed      Call MD for:  persistant nausea and vomiting    Complete by:  As directed      Call MD for:  redness, tenderness, or signs of infection (pain, swelling, redness, odor or green/yellow discharge around incision site)  Complete by:  As directed      Call MD for:  redness, tenderness, or signs of infection (pain, swelling, redness, odor or green/yellow discharge around incision site)    Complete by:  As directed      Call MD for:  severe uncontrolled pain    Complete by:  As directed      Call MD for:  severe uncontrolled pain    Complete by:  As directed      Call MD for:    Complete by:  As directed   Temperature > 101.64F     Call MD for:    Complete by:  As directed   Temperature > 101.64F     Diet - low sodium heart healthy    Complete by:  As directed      Discharge instructions    Complete by:  As directed   Please see discharge instruction sheets.  Also refer to handout given an office.  Please call our office if you have any questions or concerns [phone]     Discharge instructions    Complete by:  As directed   Please see discharge instruction sheets.  Also refer to handout given an office.  Please call our office if you have any questions or concerns [phone]     Discharge wound care:    Complete by:  As directed   If you have closed incisions, shower and bathe over these incisions with soap and water every day.  Remove all surgical dressings on postoperative day #3.  You do not need to replace dressings over the closed incisions unless you  feel more comfortable with a Band-Aid covering it.   If you have an open wound that requires packing, please see wound care instructions.  In general, remove all dressings, wash wound with soap and water and then replace with saline moistened gauze.  Do the dressing change at least every day.  Please call our office [phone] if you have further questions.     Discharge wound care:    Complete by:  As directed   If you have closed incisions, shower and bathe over these incisions with soap and water every day.  Remove all surgical dressings on postoperative day #3.  You do not need to replace dressings over the closed incisions unless you feel more comfortable with a Band-Aid covering it.   If you have an open wound that requires packing, please see wound care instructions.  In general, remove all dressings, wash wound with soap and water and then replace with saline moistened gauze.  Do the dressing change at least every day.  Please call our office [phone] if you have further questions.     Driving Restrictions    Complete by:  As directed   No driving until off narcotics and can safely swerve away without pain during an emergency     Driving Restrictions    Complete by:  As directed   No driving until off narcotics and can safely swerve away without pain during an emergency     Increase activity slowly    Complete by:  As directed   Walk an hour a day.  Use 20-30 minute walks.  When you can walk 30 minutes without difficulty, increase to low impact/moderate activities such as biking, jogging, swimming, sexual activity..  Eventually can increase to unrestricted activity when not feeling pain.  If you feel pain: STOP!Marland Kitchen   Let pain protect you from overdoing  it.  Use ice/heat/over-the-counter pain medications to help minimize his soreness.  Use pain prescriptions as needed to remain active.  It is better to take extra pain medications and be more active than to stay bedridden to avoid all pain  medications.     Increase activity slowly    Complete by:  As directed   Walk an hour a day.  Use 20-30 minute walks.  When you can walk 30 minutes without difficulty, increase to low impact/moderate activities such as biking, jogging, swimming, sexual activity..  Eventually can increase to unrestricted activity when not feeling pain.  If you feel pain: STOP!Marland Kitchen   Let pain protect you from overdoing it.  Use ice/heat/over-the-counter pain medications to help minimize his soreness.  Use pain prescriptions as needed to remain active.  It is better to take extra pain medications and be more active than to stay bedridden to avoid all pain medications.     Lifting restrictions    Complete by:  As directed   Avoid heavy lifting initially.  Do not push through pain.  You have no specific weight limit.  Coughing and sneezing or four more stressful to your incision than any lifting you will do. Pain will protect you from injury.  Therefore, avoid intense activity until off all narcotic pain medications.  Coughing and sneezing or four more stressful to your incision than any lifting he will do.     Lifting restrictions    Complete by:  As directed   Avoid heavy lifting initially.  Do not push through pain.  You have no specific weight limit.  Coughing and sneezing or four more stressful to your incision than any lifting you will do. Pain will protect you from injury.  Therefore, avoid intense activity until off all narcotic pain medications.  Coughing and sneezing or four more stressful to your incision than any lifting he will do.     May shower / Bathe    Complete by:  As directed      May shower / Bathe    Complete by:  As directed      May walk up steps    Complete by:  As directed      May walk up steps    Complete by:  As directed      Sexual Activity Restrictions    Complete by:  As directed   Sexual activity as tolerated.  Do not push through pain.  Pain will protect you from injury.     Sexual  Activity Restrictions    Complete by:  As directed   Sexual activity as tolerated.  Do not push through pain.  Pain will protect you from injury.     Walk with assistance    Complete by:  As directed   Walk over an hour a day.  May use a walker/cane/companion to help with balance and stamina.     Walk with assistance    Complete by:  As directed   Walk over an hour a day.  May use a walker/cane/companion to help with balance and stamina.            Medication List    STOP taking these medications       ALEVE 220 MG Caps  Generic drug:  Naproxen Sodium     metroNIDAZOLE 500 MG tablet  Commonly known as:  FLAGYL     neomycin 500 MG tablet  Commonly known as:  MYCIFRADIN  TAKE these medications       aspirin EC 81 MG tablet  Take 81 mg by mouth daily.     ciprofloxacin 500 MG tablet  Commonly known as:  CIPRO  Take 1 tablet (500 mg total) by mouth 2 (two) times daily.     Fish Oil 1000 MG Caps  Take 1,000 mg by mouth daily.     fluconazole 200 MG tablet  Commonly known as:  DIFLUCAN  Take 1 tablet (200 mg total) by mouth daily.     omeprazole 20 MG capsule  Commonly known as:  PRILOSEC  Take 20 mg by mouth 2 (two) times daily.     ondansetron 4 MG tablet  Commonly known as:  ZOFRAN  Take 1 tablet (4 mg total) by mouth every 8 (eight) hours as needed for nausea.     ONE DAILY FOR MEN 50+ ADVANCED PO  Take 1 tablet by mouth daily.     OVER THE COUNTER MEDICATION  Take 500 mg by mouth 3 (three) times daily. Athechel     oxyCODONE 5 MG immediate release tablet  Commonly known as:  Oxy IR/ROXICODONE  Take 1-2 tablets (5-10 mg total) by mouth every 4 (four) hours as needed for moderate pain, severe pain or breakthrough pain.     saw palmetto 160 MG capsule  Take 160 mg by mouth daily.     vitamin C 500 MG tablet  Commonly known as:  ASCORBIC ACID  Take 500 mg by mouth 2 (two) times daily.           Follow-up Information   Follow up with Jacklyn Branan  C., MD In 2 weeks. (To follow up after your operation)    Specialty:  General Surgery   Contact information:   [address] [place] [address] [phone]       Signed: Adin Hector. 04/02/2014, 7:39 AM

## 2014-04-02 NOTE — Discharge Summary (Signed)
Removed wound vac from abdominal wound and documented wound status in Assessments.  Pt c/o pain from tape sticking to body hair, but otherwise tolerated well.  Pain medicine was administered.  Discussed discharge instructions with patients including follow-up appointments, medications, precautions, and post-surgery care.  He verbalized understanding of all instructions without questions.  Pt being transported to [facility] via [place].

## 2014-04-02 NOTE — Telephone Encounter (Signed)
[name] from [facility], where pt will be at.  She needs clarification on the Over The Counter Medication : Athechol : 500 mg TID.  She wants you to clarify what this medication is.  Please advise!  [name]

## 2014-04-02 NOTE — Progress Notes (Signed)
[name] [place], [place] [address] Phone: [phone] FAX: [place] [phone] Dec 23, 1955  CARE TEAM:  PCP: Rubbie Battiest, MD  Outpatient Care Team: Patient Care Team: [name], MD as PCP - General (Family Medicine) Satira Sark, MD as Consulting Physician (Cardiology) Danie Binder, MD as Consulting Physician (Gastroenterology) Milus Banister, MD as Attending Physician (Gastroenterology) Farrel Gobble, MD as Consulting Physician (Medical Oncology) Adin Hector, MD as Consulting Physician (General Surgery)  Inpatient Treatment Team: Treatment Team: Attending Provider: Adin Hector, MD; Registered Nurse: Kristopher Oppenheim, RN; Dietitian: Toribio Harbour, RD; Registered Nurse: Nadara Mode, RN; Technician: Rudi Heap, NT; Technician: Leda Quail, NT; Technician: Parks Ranger, NT; Physical Therapist: Nathanial Rancher, PTA   Subjective:  Lightheaded yuesterday - better now Hungry - eating solid food On room air Pain controlled  Objective:  Vital signs:  Filed Vitals:   04/01/14 1400 04/01/14 2142 04/02/14 0600 04/02/14 0640  BP: 109/75 107/69 115/78   Pulse: 105 97 91   Temp: 98.2 F (36.8 C) 98.9 F (37.2 C) 98.9 F (37.2 C)   TempSrc: Oral Oral Oral   Resp: '18 18 18   ' Height:      Weight:    174 lb 1.6 oz (78.971 kg)  SpO2: 95% 97% 91%     Last BM Date: 04/01/14  Intake/Output   Yesterday:  08/12 0701 - 08/13 0700 In: 5427 [P.O.:840; IV Piggyback:350] Out: 1990 [Urine:1400; Drains:30; Stool:560] This shift:     Bowel function:  Flatus: yes  BM: slightly thick effluent in ileostomy bag  Drain: OUT  Physical Exam:  General: Pt awake/alert/oriented x4 in no acute distress.  No tired & not toxic. Eyes: PERRL, normal EOM.  Sclera clear.  No icterus Neuro: CN II-XII intact w/o focal sensory/motor deficits. Lymph: No  head/neck/groin lymphadenopathy Psych:  No delerium/psychosis/paranoia HENT: Normocephalic, Mucus membranes moist.  No thrush.  NGT in place Neck: Supple, No tracheal deviation Chest: No chest wall pain w good excursion CV:  Pulses intact.  Regular rhythm MS: Normal AROM mjr joints.  No obvious deformity Abdomen: Soft.  Mod distended.   Ileostomy & end colostomy edematous but viable.  Bilious effluent & flatus in bag.  No evidence of peritonitis.  No incarcerated hernias.  Wound vac in place & clean Ext:  SCDs BLE.  No edema.  No cyanosis Skin: No petechiae / purpura   Problem List:   Principal Problem:   Rectal cancer, uT2uN0, s/p LAR/coloanal/ileostomy 03/20/2014 Active Problems:   Tobacco abuse, in remission   GERD (gastroesophageal reflux disease)   Obesity (BMI 30-39.9)   ards   Aspiration pneumonia   Assessment  [name]  58 y.o. male  9 Days Post-Op    PROCEDURE 03/20/2014:  ROBOTIC ASSISTED low anterior resection  TRANSANAL TRANSABDOMINAL TME WITH HANDSEWN COLOANAL ANASTOMOSIS  splenic flexure mobilization  diverting loop ileostomy  PROCEDURE 03/24/2014:  FLEXIBLE SIGMOIDOSCOPY  Exploratory laparotomy  Takedown of coloanal anastomosis  Distal colectomy & end colostomy  Wound vac     Recovering.  Ileus resolving  Plan:  D/c to SNF when bed available.  FL2 signed  Agree with antibiotics for probable aspiration pneumonitis/PNA.  Switch to PO (Cipro) at d/c.  Productive cough for the past several days.  Using IS a lot.  That is hopeful.  We will see.    Yeast on 1/2 [facility] &  in initial TASP - Fluconazole antifungal coverage per ID. Change to PO at d/c  Ileostomy/colostomy care.  Check wound - continue wound vac for now.  Follow electrolytes.  Stable K   PPI for GERD.    Pathology T2 N0.  Stage I. Cure rate very high.  Just need to help get him through this stressful period  VTE prophylaxis- SCDs, etc  Placement:  Look at options for SNF for  PT/OT/ostomy/wound care - possible d/c to [place] w rehab  8/13 if he continues to improve.  D/w SW & pt.  I updated the patient's status to the patient.  Recommendations were made.  He wants me to try & reach his wife whom cannot get to the hospital - will try to call her today.  Questions were answered.  The patient expressed understanding & appreciation.   Adin Hector, M.D., F.A.C.S. Gastrointestinal and Minimally Invasive Surgery [facility] [address], [place], [place] [address] [phone] Main / Paging   04/02/2014   Results:   Labs: Results for orders placed during the hospital encounter of 03/20/14 (from the past 48 hour(s))  POTASSIUM     Status: None   Collection Time    04/01/14  4:59 AM      Result Value Ref Range   Potassium 3.9  3.7 - 5.3 mEq/L  CREATININE, SERUM     Status: Abnormal   Collection Time    04/01/14  4:59 AM      Result Value Ref Range   Creatinine, Ser 1.03  0.50 - 1.35 mg/dL   GFR calc non Af Amer 79 (*) >90 mL/min   GFR calc Af Amer >90  >90 mL/min   Comment: (NOTE)     The eGFR has been calculated using the CKD EPI equation.     This calculation has not been validated in all clinical situations.     eGFR's persistently <90 mL/min signify possible Chronic Kidney     Disease.  CBC     Status: Abnormal   Collection Time    04/01/14  4:59 AM      Result Value Ref Range   WBC 21.5 (*) 4.0 - 10.5 K/uL   RBC 2.67 (*) 4.22 - 5.81 MIL/uL   Hemoglobin 8.1 (*) 13.0 - 17.0 g/dL   HCT 24.9 (*) 39.0 - 52.0 %   MCV 93.3  78.0 - 100.0 fL   MCH 30.3  26.0 - 34.0 pg   MCHC 32.5  30.0 - 36.0 g/dL   RDW 14.7  11.5 - 15.5 %   Platelets 659 (*) 150 - 400 K/uL  POTASSIUM     Status: None   Collection Time    04/02/14  5:09 AM      Result Value Ref Range   Potassium 4.1  3.7 - 5.3 mEq/L  CREATININE, SERUM     Status: None   Collection Time    04/02/14  5:09 AM      Result Value Ref Range   Creatinine,  Ser 0.93  0.50 - 1.35 mg/dL   GFR calc non Af Amer >90  >90 mL/min   GFR calc Af Amer >90  >90 mL/min   Comment: (NOTE)     The eGFR has been calculated using the CKD EPI equation.     This calculation has not been validated in all clinical situations.     eGFR's persistently <90 mL/min signify possible Chronic Kidney  Disease.  CBC     Status: Abnormal   Collection Time    04/02/14  5:09 AM      Result Value Ref Range   WBC 21.5 (*) 4.0 - 10.5 K/uL   RBC 2.70 (*) 4.22 - 5.81 MIL/uL   Hemoglobin 8.2 (*) 13.0 - 17.0 g/dL   HCT 25.3 (*) 39.0 - 52.0 %   MCV 93.7  78.0 - 100.0 fL   MCH 30.4  26.0 - 34.0 pg   MCHC 32.4  30.0 - 36.0 g/dL   RDW 15.1  11.5 - 15.5 %   Platelets 795 (*) 150 - 400 K/uL    Imaging / Studies: No results found.  Medications / Allergies: per chart  Antibiotics: Anti-infectives   Start     Dose/Rate Route Frequency Ordered Stop   03/30/14 2200  fluconazole (DIFLUCAN) IVPB 400 mg     400 mg 100 mL/hr over 120 Minutes Intravenous Every 24 hours 03/30/14 1605 04/13/14 2159   03/27/14 0000  micafungin (MYCAMINE) 100 mg in sodium chloride 0.9 % 100 mL IVPB  Status:  Discontinued     100 mg 100 mL/hr over 1 Hours Intravenous Daily at bedtime 03/26/14 2356 03/30/14 1521   03/25/14 0800  cefTRIAXone (ROCEPHIN) 2 g in dextrose 5 % 50 mL IVPB  Status:  Discontinued    Comments:  Pharmacy may adjust dosing strength / duration / interval for maximal efficacy   2 g 100 mL/hr over 30 Minutes Intravenous Every 24 hours 03/24/14 1655 03/24/14 2102   03/24/14 2200  piperacillin-tazobactam (ZOSYN) IVPB 3.375 g     3.375 g 12.5 mL/hr over 240 Minutes Intravenous 3 times per day 03/24/14 2133 04/10/14 1559   03/24/14 2000  metroNIDAZOLE (FLAGYL) IVPB 500 mg  Status:  Discontinued     500 mg 100 mL/hr over 60 Minutes Intravenous Every 6 hours 03/24/14 1655 03/26/14 1824   03/24/14 0730  [MAR Hold]  cefoTEtan (CEFOTAN) 2 g in dextrose 5 % 50 mL IVPB     (On MAR Hold  since 03/24/14 1125)  Comments:  Pharmacy may adjust dose strength for optimal dosing.   Send with patient on call to the OR.  Anesthesia to complete antibiotic administration <18mn [facility]   2 g 100 mL/hr over 30 Minutes Intravenous On call to O.R. 03/24/14 0654608/04/15 1232   03/23/14 0830  cefTRIAXone (ROCEPHIN) 2 g in dextrose 5 % 50 mL IVPB  Status:  Discontinued    Comments:  Pharmacy may adjust dosing strength / duration / interval for maximal efficacy   2 g 100 mL/hr over 30 Minutes Intravenous Every 24 hours 03/23/14 0738 03/24/14 1655   03/23/14 0800  metroNIDAZOLE (FLAGYL) IVPB 500 mg  Status:  Discontinued     500 mg 100 mL/hr over 60 Minutes Intravenous Every 6 hours 03/23/14 0738 03/24/14 1655   03/20/14 2359  cefoTEtan (CEFOTAN) 2 g in dextrose 5 % 50 mL IVPB     2 g 100 mL/hr over 30 Minutes Intravenous Every 12 hours 03/20/14 1704 03/20/14 2345   03/20/14 1228  clindamycin (CLEOCIN) 900 mg, gentamicin (GARAMYCIN) 240 mg in sodium chloride 0.9 % 1,000 mL for intraperitoneal lavage  Status:  Discontinued       As needed 03/20/14 1228 03/20/14 1329   03/20/14 0518  cefoTEtan (CEFOTAN) 2 g in dextrose 5 % 50 mL IVPB     2 g 100 mL/hr over 30 Minutes Intravenous On call to  O.R. 03/20/14 0518 03/20/14 1259   03/20/14 0000  clindamycin (CLEOCIN) 900 mg, gentamicin (GARAMYCIN) 240 mg in sodium chloride 0.9 % 1,000 mL for intraperitoneal lavage  Status:  Discontinued    Comments:  Pharmacy may adjust dosing strength, schedule, rate of infusion, etc as needed to optimize therapy    Intraperitoneal To Surgery 03/19/14 1341 03/20/14 1453   03/19/14 1345  clindamycin (CLEOCIN) 900 mg, gentamicin (GARAMYCIN) 240 mg in sodium chloride 0.9 % 1,000 mL for intraperitoneal lavage  Status:  Discontinued    Comments:  Pharmacy may adjust dosing strength, schedule, rate of infusion, etc as needed to optimize therapy   1 application Intraperitoneal To Surgery  03/19/14 1339 03/19/14 1341       Note: Portions of this report may have been transcribed using voice recognition software. Every effort was made to ensure accuracy; however, inadvertent computerized transcription errors may be present.   Any transcriptional errors that result from this process are unintentional.

## 2014-04-03 ENCOUNTER — Other Ambulatory Visit (HOSPITAL_COMMUNITY): Payer: Self-pay | Admitting: Hematology and Oncology

## 2014-04-03 ENCOUNTER — Telehealth (HOSPITAL_COMMUNITY): Payer: Self-pay | Admitting: *Deleted

## 2014-04-03 DIAGNOSIS — D75839 Thrombocytosis, unspecified: Secondary | ICD-10-CM

## 2014-04-03 DIAGNOSIS — D691 Qualitative platelet defects: Secondary | ICD-10-CM

## 2014-04-03 DIAGNOSIS — D473 Essential (hemorrhagic) thrombocythemia: Secondary | ICD-10-CM

## 2014-04-03 MED ORDER — ASPIRIN EC 81 MG PO TBEC
81.0000 mg | DELAYED_RELEASE_TABLET | Freq: Every day | ORAL | Status: DC
Start: 2014-04-03 — End: 2015-11-22

## 2014-04-03 NOTE — Telephone Encounter (Signed)
Spoke to [employer] at [facility] and advised that they would need to contact PCP for this medication.  Robyn verbalized understanding.  [name]

## 2014-04-03 NOTE — Telephone Encounter (Signed)
Called to report a platelet count of 920,000. Dr Barnet Glasgow alerted and asks that patient be started on 81 mg aspirin daily. Patient to be seen in our clinic 04/09/2014.

## 2014-04-04 ENCOUNTER — Inpatient Hospital Stay (HOSPITAL_COMMUNITY): Payer: MEDICAID | Attending: Internal Medicine

## 2014-04-05 ENCOUNTER — Encounter (HOSPITAL_COMMUNITY): Payer: Self-pay | Admitting: Emergency Medicine

## 2014-04-05 ENCOUNTER — Emergency Department (HOSPITAL_COMMUNITY): Payer: Medicaid Other

## 2014-04-05 ENCOUNTER — Emergency Department (HOSPITAL_COMMUNITY)
Admission: EM | Admit: 2014-04-05 | Discharge: 2014-04-05 | Disposition: A | Discharge status: Home / Self Care | Payer: Medicaid Other | Attending: Emergency Medicine | Admitting: Emergency Medicine

## 2014-04-05 DIAGNOSIS — D75839 Thrombocytosis, unspecified: Secondary | ICD-10-CM

## 2014-04-05 DIAGNOSIS — K219 Gastro-esophageal reflux disease without esophagitis: Secondary | ICD-10-CM | POA: Diagnosis not present

## 2014-04-05 DIAGNOSIS — M129 Arthropathy, unspecified: Secondary | ICD-10-CM | POA: Diagnosis not present

## 2014-04-05 DIAGNOSIS — Z8589 Personal history of malignant neoplasm of other organs and systems: Secondary | ICD-10-CM | POA: Insufficient documentation

## 2014-04-05 DIAGNOSIS — E319 Polyglandular dysfunction, unspecified: Secondary | ICD-10-CM | POA: Diagnosis not present

## 2014-04-05 DIAGNOSIS — Z87891 Personal history of nicotine dependence: Secondary | ICD-10-CM | POA: Diagnosis not present

## 2014-04-05 DIAGNOSIS — R748 Abnormal levels of other serum enzymes: Secondary | ICD-10-CM | POA: Diagnosis not present

## 2014-04-05 DIAGNOSIS — T814XXA Infection following a procedure, initial encounter: Secondary | ICD-10-CM

## 2014-04-05 DIAGNOSIS — D649 Anemia, unspecified: Secondary | ICD-10-CM

## 2014-04-05 DIAGNOSIS — IMO0001 Reserved for inherently not codable concepts without codable children: Secondary | ICD-10-CM

## 2014-04-05 DIAGNOSIS — T8140XA Infection following a procedure, unspecified, initial encounter: Secondary | ICD-10-CM | POA: Insufficient documentation

## 2014-04-05 DIAGNOSIS — Z7982 Long term (current) use of aspirin: Secondary | ICD-10-CM | POA: Diagnosis not present

## 2014-04-05 DIAGNOSIS — Y849 Medical procedure, unspecified as the cause of abnormal reaction of the patient, or of later complication, without mention of misadventure at the time of the procedure: Secondary | ICD-10-CM | POA: Insufficient documentation

## 2014-04-05 DIAGNOSIS — D473 Essential (hemorrhagic) thrombocythemia: Secondary | ICD-10-CM

## 2014-04-05 DIAGNOSIS — R339 Retention of urine, unspecified: Secondary | ICD-10-CM

## 2014-04-05 DIAGNOSIS — D759 Disease of blood and blood-forming organs, unspecified: Secondary | ICD-10-CM | POA: Insufficient documentation

## 2014-04-05 DIAGNOSIS — R1012 Left upper quadrant pain: Secondary | ICD-10-CM | POA: Diagnosis present

## 2014-04-05 DIAGNOSIS — Z79899 Other long term (current) drug therapy: Secondary | ICD-10-CM | POA: Diagnosis not present

## 2014-04-05 DIAGNOSIS — R338 Other retention of urine: Secondary | ICD-10-CM | POA: Insufficient documentation

## 2014-04-05 DIAGNOSIS — K651 Peritoneal abscess: Secondary | ICD-10-CM

## 2014-04-05 LAB — URINALYSIS, ROUTINE W REFLEX MICROSCOPIC
BILIRUBIN URINE: NEGATIVE
GLUCOSE, UA: NEGATIVE mg/dL
HGB URINE DIPSTICK: NEGATIVE
KETONES UR: NEGATIVE mg/dL
Leukocytes, UA: NEGATIVE
Nitrite: NEGATIVE
PH: 6.5 (ref 5.0–8.0)
Protein, ur: NEGATIVE mg/dL
SPECIFIC GRAVITY, URINE: 1.025 (ref 1.005–1.030)
Urobilinogen, UA: 0.2 mg/dL (ref 0.0–1.0)

## 2014-04-05 LAB — CBC WITH DIFFERENTIAL/PLATELET
BASOS PCT: 0 % (ref 0–1)
Basophils Absolute: 0 10*3/uL (ref 0.0–0.1)
Eosinophils Absolute: 0.4 10*3/uL (ref 0.0–0.7)
Eosinophils Relative: 2 % (ref 0–5)
HCT: 26.3 % — ABNORMAL LOW (ref 39.0–52.0)
HEMOGLOBIN: 8.4 g/dL — AB (ref 13.0–17.0)
Lymphocytes Relative: 10 % — ABNORMAL LOW (ref 12–46)
Lymphs Abs: 1.7 10*3/uL (ref 0.7–4.0)
MCH: 30.7 pg (ref 26.0–34.0)
MCHC: 31.9 g/dL (ref 30.0–36.0)
MCV: 96 fL (ref 78.0–100.0)
MONOS PCT: 6 % (ref 3–12)
Monocytes Absolute: 1 10*3/uL (ref 0.1–1.0)
NEUTROS PCT: 82 % — AB (ref 43–77)
Neutro Abs: 13.6 10*3/uL — ABNORMAL HIGH (ref 1.7–7.7)
PLATELETS: 780 10*3/uL — AB (ref 150–400)
RBC: 2.74 MIL/uL — ABNORMAL LOW (ref 4.22–5.81)
RDW: 16.2 % — ABNORMAL HIGH (ref 11.5–15.5)
WBC: 16.7 10*3/uL — ABNORMAL HIGH (ref 4.0–10.5)

## 2014-04-05 LAB — COMPREHENSIVE METABOLIC PANEL
ALBUMIN: 1.7 g/dL — AB (ref 3.5–5.2)
ALK PHOS: 268 U/L — AB (ref 39–117)
ALT: 37 U/L (ref 0–53)
ANION GAP: 8 (ref 5–15)
AST: 28 U/L (ref 0–37)
BILIRUBIN TOTAL: 0.3 mg/dL (ref 0.3–1.2)
BUN: 13 mg/dL (ref 6–23)
CHLORIDE: 101 meq/L (ref 96–112)
CO2: 26 mEq/L (ref 19–32)
Calcium: 8.4 mg/dL (ref 8.4–10.5)
Creatinine, Ser: 0.69 mg/dL (ref 0.50–1.35)
GFR calc Af Amer: >90 mL/min (ref >90)
GFR calc non Af Amer: >90 mL/min (ref >90)
Glucose, Bld: 115 mg/dL — ABNORMAL HIGH (ref 70–99)
POTASSIUM: 4.3 meq/L (ref 3.7–5.3)
SODIUM: 135 meq/L — AB (ref 137–147)
Total Protein: 6.4 g/dL (ref 6.0–8.3)

## 2014-04-05 LAB — CULTURE, BLOOD (ROUTINE X 2)
CULTURE: NO GROWTH
CULTURE: NO GROWTH

## 2014-04-05 LAB — LIPASE, BLOOD: Lipase: 45 U/L (ref 11–59)

## 2014-04-05 LAB — LACTIC ACID, PLASMA: Lactic Acid, Venous: 1.5 mmol/L (ref 0.5–2.2)

## 2014-04-05 MED ORDER — ONDANSETRON HCL 4 MG/2ML IJ SOLN
4.0000 mg | Freq: Once | INTRAMUSCULAR | Status: AC
  Administered 2014-04-05: 4 mg via INTRAVENOUS
  Filled 2014-04-05: qty 2

## 2014-04-05 MED ORDER — IOHEXOL 300 MG/ML  SOLN
50.0000 mL | Freq: Once | INTRAMUSCULAR | Status: AC | PRN
  Administered 2014-04-05: 50 mL via ORAL

## 2014-04-05 MED ORDER — HYDROMORPHONE HCL PF 1 MG/ML IJ SOLN
1.0000 mg | Freq: Once | INTRAMUSCULAR | Status: AC
Start: 2014-04-05 — End: 2014-04-05
  Administered 2014-04-05: 1 mg via INTRAVENOUS

## 2014-04-05 MED ORDER — HYDROMORPHONE HCL PF 1 MG/ML IJ SOLN
INTRAMUSCULAR | Status: AC
  Administered 2014-04-05: 1 mg via INTRAVENOUS
  Filled 2014-04-05: qty 1

## 2014-04-05 MED ORDER — HYDROMORPHONE HCL PF 1 MG/ML IJ SOLN
1.0000 mg | Freq: Once | INTRAMUSCULAR | Status: AC
  Administered 2014-04-05: 1 mg via INTRAVENOUS
  Filled 2014-04-05: qty 1

## 2014-04-05 MED ORDER — SODIUM CHLORIDE 0.9 % IV SOLN
Freq: Once | INTRAVENOUS | Status: AC
  Administered 2014-04-05: 05:00:00 via INTRAVENOUS

## 2014-04-05 MED ORDER — IOHEXOL 300 MG/ML  SOLN
100.0000 mL | Freq: Once | INTRAMUSCULAR | Status: AC | PRN
  Administered 2014-04-05: 100 mL via INTRAVENOUS

## 2014-04-05 NOTE — ED Notes (Addendum)
Pt discharged to [facility] with wife. Pt verbalized understanding of instructions. Ostomy bag emptied per pt request.  NAD noted.

## 2014-04-05 NOTE — ED Provider Notes (Signed)
CSN: [id number]     Arrival date & time 04/05/14  0401 History   First MD Initiated Contact with Patient 04/05/14 0421     Chief Complaint  Patient presents with  . Abdominal Pain     (Consider location/radiation/quality/duration/timing/severity/associated sxs/prior Treatment) Patient is a 58 y.o. male presenting with abdominal pain. The history is provided by the patient.  Abdominal Pain He is in pain [place] home following surgery for rectal cancer. Today, he was straining to urinate and had sudden onset of pain in the left upper abdomen and around the site of his colostomy. Pain is moderate to severe he rates at 8/10. There is no associated nausea or vomiting. Nothing makes the pain better nothing makes it worse. He states that he has not been urinating well and that he did require catheterization while he was in the hospital. He denies fever, chills, sweats.  Past Medical History  Diagnosis Date  . GERD (gastroesophageal reflux disease)   . Shortness of breath     due to weight gain poststopping smokeing  . Arthritis   . Cancer    Past Surgical History  Procedure Laterality Date  . Colonoscopy N/A 12/22/2013    Procedure: COLONOSCOPY;  Surgeon: Danie Binder, MD;  Location: [facility];  Service: Endoscopy;  Laterality: N/A;  8:30  . Esophagogastroduodenoscopy N/A 12/22/2013    Procedure: ESOPHAGOGASTRODUODENOSCOPY (EGD);  Surgeon: Danie Binder, MD;  Location: [facility];  Service: Endoscopy;  Laterality: N/A;  . Eus N/A 01/01/2014    Procedure: LOWER ENDOSCOPIC ULTRASOUND (EUS);  Surgeon: Milus Banister, MD;  Location: [facility];  Service: Endoscopy;  Laterality: N/A;  . Hip fracture surgery Left age 49 on 04-06-1970  . Eye reattachment and ear reattachment  Right 1971  . Colon resection N/A 03/24/2014    Procedure: Diagnostic  laparotomy, takedown of coloanal anastamosis, end colostomy, with wound vac;  Surgeon: Adin Hector, MD;  Location: [facility];  Service:  General;  Laterality: N/A;  . Flexible sigmoidoscopy N/A 03/24/2014    Procedure: FLEXIBLE SIGMOIDOSCOPY;  Surgeon: Adin Hector, MD;  Location: [facility];  Service: General;  Laterality: N/A;   Family History  Problem Relation Age of Onset  . Colon polyps Neg Hx   . Colon cancer Sister    History  Substance Use Topics  . Smoking status: Former Smoker -- 0.00 packs/day for 15 years    Types: Cigars    Quit date: 09/10/2011  . Smokeless tobacco: Never Used  . Alcohol Use: No     Comment: quit years ago    Review of Systems  Gastrointestinal: Positive for abdominal pain.  All other systems reviewed and are negative.     Allergies  Review of patient's allergies indicates no known allergies.  Home Medications   Prior to Admission medications   Medication Sig Start Date End Date Taking? Authorizing Provider  aspirin EC 81 MG tablet Take 81 mg by mouth daily.    Historical Provider, MD  aspirin EC 81 MG tablet Take 1 tablet (81 mg total) by mouth daily. 04/03/14   Farrel Gobble, MD  ciprofloxacin (CIPRO) 500 MG tablet Take 1 tablet (500 mg total) by mouth 2 (two) times daily. 04/02/14   Adin Hector, MD  fluconazole (DIFLUCAN) 200 MG tablet Take 1 tablet (200 mg total) by mouth daily. 04/02/14   Adin Hector, MD  Multiple Vitamins-Minerals (ONE DAILY FOR MEN 50+ ADVANCED PO) Take 1 tablet by mouth daily.  Historical Provider, MD  Omega-3 Fatty Acids (FISH OIL) 1000 MG CAPS Take 1,000 mg by mouth daily.    Historical Provider, MD  omeprazole (PRILOSEC) 20 MG capsule Take 20 mg by mouth 2 (two) times daily.    Historical Provider, MD  ondansetron (ZOFRAN) 4 MG tablet Take 1 tablet (4 mg total) by mouth every 8 (eight) hours as needed for nausea. 03/20/14   Adin Hector, MD  OVER THE COUNTER MEDICATION Take 500 mg by mouth 3 (three) times daily. [place]    Historical Provider, MD  oxyCODONE (OXY IR/ROXICODONE) 5 MG immediate release tablet Take one or two tablets by mouth  every 4 hours as needed for moderate, severe or breakthrough pain 04/02/14   Pricilla Larsson, NP  saw palmetto 160 MG capsule Take 160 mg by mouth daily.    Historical Provider, MD  vitamin C (ASCORBIC ACID) 500 MG tablet Take 500 mg by mouth 2 (two) times daily.    Historical Provider, MD   BP 114/90  Pulse 94  Temp(Src) 98 F (36.7 C) (Oral)  Resp 20  SpO2 92% Physical Exam  Nursing note and vitals reviewed.  58 year old male, resting comfortably and in no acute distress. Vital signs are normal. Oxygen saturation is 92%, which is normal. Head is normocephalic and atraumatic. PERRLA, EOMI. Oropharynx is clear. Neck is nontender and supple without adenopathy or JVD. Back is nontender and there is no CVA tenderness. Lungs are clear without rales, wheezes, or rhonchi. Chest is nontender. Heart has regular rate and rhythm without murmur. Abdomen: Ileostomy is present in the right midabdomen, colostomy present the left upper abdomen. Midline lower abdominal scar is open with packing in place. There is tenderness to palpation around the colostomy site with some induration. Remainder of abdomen is soft and nontender. Bowel sounds are decreased. Extremities have no cyanosis or edema, full range of motion is present. Skin is warm and dry without rash. Neurologic: Mental status is normal, cranial nerves are intact, there are no motor or sensory deficits.  ED Course  Procedures (including critical care time) Labs Review Results for orders placed during the hospital encounter of 04/05/14  CBC WITH DIFFERENTIAL      Result Value Ref Range   WBC 16.7 (*) 4.0 - 10.5 K/uL   RBC 2.74 (*) 4.22 - 5.81 MIL/uL   Hemoglobin 8.4 (*) 13.0 - 17.0 g/dL   HCT 26.3 (*) 39.0 - 52.0 %   MCV 96.0  78.0 - 100.0 fL   MCH 30.7  26.0 - 34.0 pg   MCHC 31.9  30.0 - 36.0 g/dL   RDW 16.2 (*) 11.5 - 15.5 %   Platelets 780 (*) 150 - 400 K/uL   Neutrophils Relative % 82 (*) 43 - 77 %   Neutro Abs 13.6 (*) 1.7 - 7.7  K/uL   Lymphocytes Relative 10 (*) 12 - 46 %   Lymphs Abs 1.7  0.7 - 4.0 K/uL   Monocytes Relative 6  3 - 12 %   Monocytes Absolute 1.0  0.1 - 1.0 K/uL   Eosinophils Relative 2  0 - 5 %   Eosinophils Absolute 0.4  0.0 - 0.7 K/uL   Basophils Relative 0  0 - 1 %   Basophils Absolute 0.0  0.0 - 0.1 K/uL  COMPREHENSIVE METABOLIC PANEL      Result Value Ref Range   Sodium 135 (*) 137 - 147 mEq/L   Potassium 4.3  3.7 - 5.3 mEq/L  Chloride 101  96 - 112 mEq/L   CO2 26  19 - 32 mEq/L   Glucose, Bld 115 (*) 70 - 99 mg/dL   BUN 13  6 - 23 mg/dL   Creatinine, Ser 0.69  0.50 - 1.35 mg/dL   Calcium 8.4  8.4 - 10.5 mg/dL   Total Protein 6.4  6.0 - 8.3 g/dL   Albumin 1.7 (*) 3.5 - 5.2 g/dL   AST 28  0 - 37 U/L   ALT 37  0 - 53 U/L   Alkaline Phosphatase 268 (*) 39 - 117 U/L   Total Bilirubin 0.3  0.3 - 1.2 mg/dL   GFR calc non Af Amer >90  >90 mL/min   GFR calc Af Amer >90  >90 mL/min   Anion gap 8  5 - 15  URINALYSIS, ROUTINE W REFLEX MICROSCOPIC      Result Value Ref Range   Color, Urine YELLOW  YELLOW   APPearance CLEAR  CLEAR   Specific Gravity, Urine 1.025  1.005 - 1.030   pH 6.5  5.0 - 8.0   Glucose, UA NEGATIVE  NEGATIVE mg/dL   Hgb urine dipstick NEGATIVE  NEGATIVE   Bilirubin Urine NEGATIVE  NEGATIVE   Ketones, ur NEGATIVE  NEGATIVE mg/dL   Protein, ur NEGATIVE  NEGATIVE mg/dL   Urobilinogen, UA 0.2  0.0 - 1.0 mg/dL   Nitrite NEGATIVE  NEGATIVE   Leukocytes, UA NEGATIVE  NEGATIVE  LIPASE, BLOOD      Result Value Ref Range   Lipase 45  11 - 59 U/L  LACTIC ACID, PLASMA      Result Value Ref Range   Lactic Acid, Venous 1.5  0.5 - 2.2 mmol/L   Imaging Review Dg Chest 2 View  04/04/2014   CLINICAL DATA:  Followup of pneumonia. Recent surgery. Diminished breath sounds.  EXAM: CHEST  2 VIEW  COMPARISON:  03/29/2014  FINDINGS: Mild hyperinflation. Lateral view degraded by patient arm position. Midline trachea. Normal heart size. No pleural effusion or pneumothorax. Interstitial  thickening is improved. No lobar consolidation.  IMPRESSION: Improved interstitial thickening. This could represent the sequelae of chronic bronchitis or residual mild pulmonary venous congestion.  No lobar consolidation to suggest pneumonia.   Electronically Signed   By: Abigail Miyamoto M.D.   On: 04/04/2014 16:57   Ct Abdomen Pelvis W Contrast  04/05/2014   CLINICAL DATA:  Colon resection April 03, 2014, pain at incision site. Difficulty urinating.  EXAM: CT ABDOMEN AND PELVIS WITH CONTRAST  TECHNIQUE: Multidetector CT imaging of the abdomen and pelvis was performed using the standard protocol following bolus administration of intravenous contrast.  CONTRAST:  24mL OMNIPAQUE IOHEXOL 300 MG/ML SOLN, 141mL OMNIPAQUE IOHEXOL 300 MG/ML SOLN  COMPARISON:  CT of the abdomen and pelvis March 30, 2014  FINDINGS: LUNG BASES: Included view of the lung bases demonstrate diffuse micro nodularity, and persisting consolidation right lower lobe, to a lesser extent left lung base. Visualized heart and pericardium are unremarkable.  SOLID ORGANS: The liver, spleen, gallbladder, pancreas and adrenal glands are unremarkable.  GASTROINTESTINAL TRACT: Right lower quadrant ileostomy, left lower quadrant colostomy, status post partial colectomy. Fluid filled mildly distended small bowel measuring up to 3.7 cm, slightly decreased from prior imaging. Contrast does not reach the distal small bowel. Stomach is unremarkable.  KIDNEYS/ URINARY TRACT: Kidneys are orthotopic, demonstrating symmetric enhancement. No nephrolithiasis, hydronephrosis or solid renal masses. The unopacified ureters are normal in course and caliber. Delayed imaging through the kidneys demonstrates  symmetric prompt contrast excretion within the proximal urinary collecting system. Urinary bladder is decompressed containing a Foley catheter.  PERITONEUM/RETROPERITONEUM: Slight increase in size of 18 x 29 x 32 mm intraperitoneal fluid collection immediately deep to the  laparotomy defect, axial 68/97, with surrounding inflammatory changes. Aortoiliac vessels are normal in course and caliber. No lymphadenopathy by CT size criteria. Internal reproductive organs are unremarkable. Interval removal of surgical drains.  SOFT TISSUE/OSSEOUS STRUCTURES: Midline laparotomy open wound, no free fluid or fluid collections within the subcutaneous fat. Severe left hip osteoarthrosis and deformity which may be posttraumatic.  IMPRESSION: Interval partial colectomy with left lower quadrant colostomy, right lower quadrant ileostomy. Slightly decreased fluid distended small bowel may reflect ileus, less likely obstruction.  Increasing intraperitoneal 18 x 29 x 32 mm fluid collection deep to the laparotomy defect concerning for abscess as, this is increased in size from prior imaging, status post interval removal of surgical drains.  Similar micro nodularity and consolidation in the lung bases.   Electronically Signed   By: Elon Alas   On: 04/05/2014 06:57   Images viewed by me.  MDM   Final diagnoses:  Intra-abdominal abscess post-procedure, initial encounter  Urinary retention  Normochromic normocytic anemia  Elevated serum alkaline phosphatase level  Left upper quadrant pain  Thrombocytosis    Abdominal pain at site of colostomy. Old records are reviewed and he had a stormy hospital course following his cancer resection. He initially had an ileostomy and then developed necrosis at the site of colon anastomosis and had a second resection with bringing up of colostomy. Screening labs will be obtained including lactic acid. We'll obtain CT scan to further evaluate.  CT shows a 2 cm x 3 cm collection of fluid deep to the laparotomy but no other acute process. Facet been present on prior scan but is slightly larger. It is worrisome for abscess. Have discussed case with r is who is on call for [facility] was also reviewed the images and feels that this can be  managed as an outpatient. I have removed his packing and there is no sign of any communication of the fluid collection in the abdomen with the outside. He is currently taking ciprofloxacin and is to continue taking that. His next appointment with his surgeon is in 8 days. Family is instructed to see if they can move that appointment up to the next 3-4 days. He is to return should he start running fever or if pain should be getting worse.  Delora Fuel, MD 40/98/11 [id number]

## 2014-04-05 NOTE — ED Notes (Signed)
Moderate amount of brown stool in right colostomy.

## 2014-04-05 NOTE — ED Provider Notes (Signed)
Nurses report bladder scan showed over 500 cc, foley was inserted and he had over 900 cc output.They report improvement of his abdominal pain.  Advised nursing staff to leave foley in place for now for acute urinary retention.   Rolland Porter, MD, FACEP   Janice Norrie, MD 04/05/14 [phone]

## 2014-04-05 NOTE — ED Notes (Signed)
Foley cath kept in place per vo dr Tomi Bamberger due to intial output of 961ml and another 348ml right at d/c. Jocelyn Lamer, RN at [facility] center aware.

## 2014-04-05 NOTE — ED Notes (Signed)
Patient from [facility].  Patient had colon resection on 03/24/14; c/o pain at incision site.  Patient also states having difficulty urinating.  Patient was given Oxycodone at [place] at [facility] per [place].

## 2014-04-05 NOTE — Discharge Instructions (Signed)
Continue taking your medications as prescribed. Return if pain is getting worse or if you start running a fever.

## 2014-04-05 NOTE — ED Notes (Signed)
Patient is post surgical from [facility], states he has pain to his surgical sites to his abdomen.

## 2014-04-05 NOTE — ED Notes (Addendum)
Pt presents with two ostomy bags; the one on left side is not working. Right side appears to be working aeb presence of stool. Wound at midline of abdomen shows no redness or s/s of infection. The area above the wound is hard, but is non tender to touch.

## 2014-04-06 ENCOUNTER — Telehealth (INDEPENDENT_AMBULATORY_CARE_PROVIDER_SITE_OTHER): Payer: Self-pay

## 2014-04-06 ENCOUNTER — Non-Acute Institutional Stay (SKILLED_NURSING_FACILITY): Payer: Medicaid Other | Admitting: Internal Medicine

## 2014-04-06 DIAGNOSIS — R1032 Left lower quadrant pain: Secondary | ICD-10-CM

## 2014-04-06 DIAGNOSIS — T889XXS Complication of surgical and medical care, unspecified, sequela: Secondary | ICD-10-CM

## 2014-04-06 DIAGNOSIS — T8189XS Other complications of procedures, not elsewhere classified, sequela: Secondary | ICD-10-CM

## 2014-04-06 DIAGNOSIS — Z432 Encounter for attention to ileostomy: Secondary | ICD-10-CM

## 2014-04-06 DIAGNOSIS — R339 Retention of urine, unspecified: Secondary | ICD-10-CM

## 2014-04-06 LAB — URINE CULTURE
Colony Count: NO GROWTH
Culture: NO GROWTH

## 2014-04-06 NOTE — Telephone Encounter (Signed)
Placed referral in epic for urologist due to urinary retention.

## 2014-04-07 ENCOUNTER — Encounter (INDEPENDENT_AMBULATORY_CARE_PROVIDER_SITE_OTHER): Payer: Self-pay | Admitting: Surgery

## 2014-04-07 ENCOUNTER — Ambulatory Visit (INDEPENDENT_AMBULATORY_CARE_PROVIDER_SITE_OTHER): Payer: Self-pay | Admitting: Surgery

## 2014-04-07 ENCOUNTER — Telehealth (INDEPENDENT_AMBULATORY_CARE_PROVIDER_SITE_OTHER): Payer: Self-pay

## 2014-04-07 VITALS — BP 136/72 | HR 75 | Temp 97.3°F | Ht 64.0 in | Wt 167.0 lb

## 2014-04-07 DIAGNOSIS — R338 Other retention of urine: Secondary | ICD-10-CM

## 2014-04-07 DIAGNOSIS — H6992 Unspecified Eustachian tube disorder, left ear: Secondary | ICD-10-CM

## 2014-04-07 DIAGNOSIS — H6982 Other specified disorders of Eustachian tube, left ear: Secondary | ICD-10-CM

## 2014-04-07 DIAGNOSIS — H6980 Other specified disorders of Eustachian tube, unspecified ear: Secondary | ICD-10-CM

## 2014-04-07 DIAGNOSIS — C20 Malignant neoplasm of rectum: Secondary | ICD-10-CM

## 2014-04-07 MED ORDER — JOBST FOR MEN 15-20MMHG MED MISC: 1.0000 | Freq: Every day | Status: DC

## 2014-04-07 MED ORDER — TAMSULOSIN HCL 0.4 MG PO CAPS: 0.4000 mg | ORAL_CAPSULE | Freq: Every day | ORAL | Status: DC

## 2014-04-07 MED ORDER — LORATADINE 10 MG PO TABS: 10.0000 mg | ORAL_TABLET | Freq: Every day | ORAL | Status: DC

## 2014-04-07 NOTE — Telephone Encounter (Signed)
Shirlean Mylar called me right back to notify me that pt can be seen tomorrow 04/08/14 arrive 9:30 to see Dr Nicki Reaper McDirmiard. I will call pt at [facility].

## 2014-04-07 NOTE — Patient Instructions (Signed)
Please consider the recommendations that we have given you today:  Followup with [facility] [phone].  I will help troubleshoot your catheter.  Restart Flomax to help shrink the prostate and make urination easier.  Continue wound and ostomy care.  Continue needing with nutrition.  Consider adding Ensure or Boost 1-2 cans a day to improve nutrition.  Consider Jobst compression stockings to help with swelling and discomfort of calves.  If not better, call us for possible further studies.  Complete antibiotics for pneumonia infection.  Nearly done.  See the Handout(s) we have given you.  Please call our office at [phone] if you wish to schedule surgery or if you have further questions / concerns.   ABDOMINAL SURGERY: POST OP INSTRUCTIONS  1. DIET: Follow a light bland diet the first 24 hours after arrival home, such as soup, liquids, crackers, etc.  Be sure to include lots of fluids daily.  Avoid fast food or heavy meals as your are more likely to get nauseated.  Eat a low fat the next few days after surgery.   2. Take your usually prescribed home medications unless otherwise directed. 3. PAIN CONTROL: a. Pain is best controlled by a usual combination of three different methods TOGETHER: i. Ice/Heat ii. Over the counter pain medication iii. Prescription pain medication b. Most patients will experience some swelling and bruising around the incisions.  Ice packs or heating pads (30-60 minutes up to 6 times a day) will help. Use ice for the first few days to help decrease swelling and bruising, then switch to heat to help relax tight/sore spots and speed recovery.  Some people prefer to use ice alone, heat alone, alternating between ice & heat.  Experiment to what works for you.  Swelling and bruising can take several weeks to resolve.   c. It is helpful to take an over-the-counter pain medication regularly for the first few weeks.  Choose one of the following that works best for  you: i. Naproxen (Aleve, etc)  Two 238m tabs twice a day ii. Ibuprofen (Advil, etc) Three 2063mtabs four times a day (every meal & bedtime) iii. Acetaminophen (Tylenol, etc) 500-65037mour times a day (every meal & bedtime) d. A  prescription for pain medication (such as oxycodone, hydrocodone, etc) should be given to you upon discharge.  Take your pain medication as prescribed.  i. If you are having problems/concerns with the prescription medicine (does not control pain, nausea, vomiting, rash, itching, etc), please call us [phone] see if we need to switch you to a different pain medicine that will work better for you and/or control your side effect better. ii. If you need a refill on your pain medication, please contact your pharmacy.  They will contact our office to request authorization. Prescriptions will not be filled after 5 pm or on week-ends. 4. Avoid getting constipated.  Between the surgery and the pain medications, it is common to experience some constipation.  Increasing fluid intake and taking a fiber supplement (such as Metamucil, Citrucel, FiberCon, MiraLax, etc) 1-2 times a day regularly will usually help prevent this problem from occurring.  A mild laxative (prune juice, Milk of Magnesia, MiraLax, etc) should be taken according to package directions if there are no bowel movements after 48 hours.   5. Watch out for diarrhea.  If you have many loose bowel movements, simplify your diet to bland foods & liquids for a few days.  Stop any stool softeners and decrease your fiber  supplement.  Switching to mild anti-diarrheal medications (Kayopectate, Pepto Bismol) can help.  If this worsens or does not improve, please call us. 6. Wash / shower every day.  You may shower over the incision / wound.  Avoid baths until the skin is fully healed.  Continue to shower over incision(s) after the dressing is off. 7. Remove your waterproof bandages 5 days after surgery.  You may leave the  incision open to air.  You may replace a dressing/Band-Aid to cover the incision for comfort if you wish. 8. ACTIVITIES as tolerated:   a. You may resume regular (light) daily activities beginning the next day-such as daily self-care, walking, climbing stairs-gradually increasing activities as tolerated.  If you can walk 30 minutes without difficulty, it is safe to try more intense activity such as jogging, treadmill, bicycling, low-impact aerobics, swimming, etc. b. Save the most intensive and strenuous activity for last such as sit-ups, heavy lifting, contact sports, etc  Refrain from any heavy lifting or straining until you are off narcotics for pain control.   c. DO NOT PUSH THROUGH PAIN.  Let pain be your guide: If it hurts to do something, don't do it.  Pain is your body warning you to avoid that activity for another week until the pain goes down. d. You may drive when you are no longer taking prescription pain medication, you can comfortably wear a seatbelt, and you can safely maneuver your car and apply brakes. e. Dennis Bast may have sexual intercourse when it is comfortable.  9. FOLLOW UP in our office a. Please call [facility] at [phone] to set up an appointment to see your surgeon in the office for a follow-up appointment approximately 1-2 weeks after your surgery. b. Make sure that you call for this appointment the day you arrive home to insure a convenient appointment time. 10. IF YOU HAVE DISABILITY OR FAMILY LEAVE FORMS, BRING THEM TO THE OFFICE FOR PROCESSING.  DO NOT GIVE THEM TO YOUR DOCTOR.   WHEN TO CALL us [phone]: 1. Poor pain control 2. Reactions / problems with new medications (rash/itching, nausea, etc)  3. Fever over 101.5 F (38.5 C) 4. Inability to urinate 5. Nausea and/or vomiting 6. Worsening swelling or bruising 7. Continued bleeding from incision. 8. Increased pain, redness, or drainage from the incision  The clinic staff is available to answer your questions  during regular business hours (8:30am-5pm).  Please don't hesitate to call and ask to speak to one of our nurses for clinical concerns.   A surgeon from [facility] is always on call at the hospitals   If you have a medical emergency, go to the nearest emergency room or call 911.    [facility], [address] [place], [place], [place], [place]  [address] ? MAIN: [phone] ? TOLL FREE: [phone] ? FAX [phone] [email or web address]   WOUND CARE  It is important that the wound be kept open.   -Keeping the skin edges apart will allow the wound to gradually heal from the base upwards.   - If the skin edges of the wound close too early, a new fluid pocket can form and infection can occur. -This is the reason to pack deeper wounds with gauze or ribbon -This is why drained wounds cannot be sewed closed right away  A healthy wound should form a lining of bright red "beefy" granulating tissue that will help shrink the wound and help the edges grow new skin into  it.   -A little mucus / yellow discharge is normal (the body's natural way to try and form a scab) and should be gently washed off with soap and water with daily dressing changes.  -Green or foul smelling drainage implies bacterial colonization and can slow wound healing - a short course of antibiotic ointment (3-5 days) can help it clear up.  Call the doctor if it does not improve or worsens  -Avoid use of antibiotic ointments for more than a week as they can slow wound healing over time.    -Sometimes other wound care products will be used to reduce need for dressing changes and/or help clean up dirty wounds -Sometimes the surgeon needs to debride the wound in the office to remove dead or infected tissue out of the wound so it can heal more quickly and safely.    Change the dressing at least once a day -Wash the wound with mild soap and water gently every day.  It is good to shower or  bathe the wound to help it clean out. -Use clean 4x4 gauze for medium/large wounds or ribbon plain NU-gauze for smaller wounds (it does not need to be sterile, just clean) -Keep the raw wound moist with a little saline or KY (saline) gel on the gauze.  -A dry wound will take longer to heal.  -Keep the skin dry around the wound to prevent breakdown and irritation. -Pack the wound down to the base -The goal is to keep the skin apart, not overpack the wound -Use a Q-tip or blunt-tipped kabob stick toothpick to push the gauze down to the base in narrow or deep wounds   -Cover with a clean gauze and tape -paper or Medipore tape tend to be gentle on the skin -rotate the orientation of the tape to avoid repeated stress/trauma on the skin -using an ACE or Coban wrap on wounds on arms or legs can be used instead.  Complete all antibiotics through the entire prescription to help the infection heal and prevent new places of infection   Returning the see the surgeon is helpful to follow the healing process and help the wound close as fast as possible.  Ostomy Support Information  Yes, you've heard that people get along just fine with only one of their eyes, or one of their lungs, or one of their kidneys. But you also know that you have only one intestine and only one bladder, and that leaves you feeling awfully empty, both physically and emotionally: You think no other people go around without part of their intestine with the ends of their intestines sticking out through their abdominal walls.  Well, you are wrong! There are nearly three quarters of a million people in the [place] who have an ostomy; people who have had surgery to remove all or part of their colons or bladders. There is even a national association, the [place] [employer] [place] with over 350 local affiliated support groups that are organized by volunteers who provide peer support and counseling. [place] has a toll free telephone num-ber,  [phone] and an educational,  interactive website, www.ostomy.org   An ostomy is an opening in the belly (abdominal wall) made by surgery. Ostomates are people who have had this procedure. The opening (stoma) allows the kidney or bowel to discharge waste. An external pouch covers the stoma to collect waste. Pouches are are a simple bag and are odor free. Different companies have disposable or reusable pouches to fit one's lifestyle.  An ostomy can either be temporary or permanent.  THERE ARE THREE MAIN TYPES OF OSTOMIES  Colostomy. A colostomy is a surgically created opening in the large intestine (colon).  Ileostomy. An ileostomy is a surgically created opening in the small intestine.  Urostomy. A urostomy is a surgically created opening to divert urine away from the bladder. FREQUENTLY ASKED QUESTIONS   Why haven't you met any of these folks who have an ostomy?  Well, maybe you have! You just did not recognize them because an ostomy doesn't show. It can be kept secret if you wish. Why, maybe some of your best friends, office associates or neighbors have an ostomy ... you never can tell.   People facing ostomy surgery have many quality-of-life questions like:  Will you bulge? Smell? Make noises? Will you feel waste leaving your body? Will you be a captive of the toilet? Will you starve? Be a social outcast? Get/stay married? Have babies? Easily bathe, go swimming, bend over?  OK, let's look at what you can expect:  Will you bulge?  Remember, without part of the intestine or bladder, and its contents, you should have a flatter tummy than before. You can expect to wear, with little exception, what you wore before surgery ... and this in-cludes tight clothing and bathing suits.  Will you smell?  Today, thanks to modern odor proof pouching systems, you can walk into an ostomy support group meeting and not smell anything that is foul or offensive. And, for those with an ileostomy or colostomy  who are concerned about odor when emptying their pouch, there are in-pouch deodorants that can be used to eliminate any waste odors that may exist.  Will you make noises?  Everyone produces gas, especially if they are an air-swallower. But intestinal sounds that occur from time to time are no differ-ent than a gurgling tummy, and quite often your clothing will muffle any sounds.   Will you feel the waste discharges?  For those with a colostomy or ileostomy there might be a slight pressure when waste leaves your body, but understand that the intestines have no nerve endings, so there will be no unpleasant sensations. Those with a urostomy will probably be unaware of any kidney drainage.  Will you be a captive of the toilet?  Immediately post-op you will spend more time in the bathroom than you will after your body recovers from surgery. Every person is different, but on average those with an ileostomy or urostomy may empty their pouches 4 to 6 times a day; a little  less if you have a colostomy. The average wear time between pouch system changes is 3 to 5 days and the changing process should take less than 30 minutes.  Will I need to be on a special diet? Most people return to their normal diet when they have recovered from surgery. Be sure to chew your food well, eat a well-balanced diet and drink plenty of fluids. If you experience problems with a certain food, wait a couple of weeks and try it again. Will there be odor and noises? Pouching systems are designed to be odor-proof or odor-resistant. There are deodorants that can be used in the pouch. Medications are also available to help reduce odor. Limit gas-producing foods and carbonated beverages. You will experience less gas and fewer noises as you heal from surgery. How much time will it take to care for my ostomy? At first, you may spend a lot of time learning about your ostomy  and how to take care of it. As you become more comfortable and  skilled at changing the pouching system, it will take very little time to care for it.  Will I be able to return to work? People with ostomies can perform most jobs. As soon as you have healed from surgery, you should be able to return to work. Heavy lifting (more than 10 pounds) may be discouraged.  What about intimacy? Sexual relationships and intimacy are important and fulfilling aspects of your life. They should continue after ostomy surgery. Intimacy-related concerns should be discussed openly between you and your partner.  Can I wear regular clothing? You do not need to wear special clothing. Ostomy pouches are fairly flat and barely noticeable. Elastic undergarments will not hurt the stoma or prevent the ostomy from functioning.  Can I participate in sports? An ostomy should not limit your involvement in sports. Many people with ostomies are runners, skiers, swimmers or participate in other active lifestyles. Talk with your caregiver first before doing heavy physical activity.  Will you starve?  Not if you follow doctor's orders at each stage of your post-op adjustment. There is no such thing as an "ostomy diet". Some people with an ostomy will be able to eat and tolerate anything; others may find diffi-culty with some foods. Each person is an individual and must determine, by trial, what is best for them. A good practice for all is to drink plenty of water.  Will you be a social outcast?  Have you met anyone who has an ostomy and is a social outcast? Why should you be the first? Only your attitude and self image will effect how you are treated. No confi-dent person is an Occupational psychologist.   PROFESSIONAL HELP  Resources are available if you need help or have questions about your ostomy.    Specially trained nurses called Wound, Ostomy Continence Nurses (WOCN) are available for consultation in most major medical centers.   Consider getting an ostomy consult with Cena Benton at [facility] to help troubleshoot stoma pouch fittings and other issues with your ostomy: [phone]   The [employer] Ostomy Association (UOA) is a group made up of many local chapters throughout the [place]. These local groups hold meetings and provide support to prospective and existing ostomates. They sponsor educational events and have qualified visitors to make personal or telephone visits. Contact the UOA for the chapter nearest you and for other educational publications.  More detailed information can be found in Colostomy Guide, a publication of the [employer] (UOA). Contact UOA at 1-(682)469-1490 or visit their web site at https://arellano.com/. The website contains links to other sites, suppliers and resources. Document Released: 08/10/2003 Document Revised: 10/30/2011 Document Reviewed: 12/09/2008 [facility] Patient Information 2013 [place].  Leg Cramps Leg cramps that occur during exercise can be caused by poor circulation or dehydration. However, muscle cramps that occur at rest or during the night are usually not due to any serious medical problem. Heat cramps may cause muscle spasms during hot weather.  CAUSES There is no clear cause for muscle cramps. However, dehydration may be a factor for those who do not drink enough fluids and those who exercise in the heat. Imbalances in the level of sodium, potassium, calcium or magnesium in the muscle tissue may also be a factor. Some medications, such as water pills (diuretics), may cause loss of chemicals that the body needs (like sodium and potassium) and cause muscle cramps. TREATMENT  Make sure your diet has enough fluids and essential minerals for the muscle to work normally.  Avoid strenuous exercise for several days if you have been having frequent leg cramps.  Stretch and massage the cramped muscle for several minutes.  Some medicines may be helpful in some patients with night cramps. Only take over-the-counter or  prescription medicines as directed by your caregiver. SEEK IMMEDIATE MEDICAL CARE IF:   Your leg cramps become worse.  Your foot becomes cold, numb, or blue. Document Released: 09/14/2004 Document Revised: 10/30/2011 Document Reviewed: 09/01/2008 [facility] Patient Information 2015 [place], [place]. This information is not intended to replace advice given to you by your health care provider. Make sure you discuss any questions you have with your health care provider.

## 2014-04-07 NOTE — Telephone Encounter (Signed)
Spoke with [place] who takes care of the pt. Gave her the appt for tomorrow 8/19 9:30 with Dr Nicki Reaper McDiarmid at [facility].

## 2014-04-07 NOTE — Progress Notes (Signed)
Subjective:     Patient ID: [name], male   DOB: 01/10/1956, 58 y.o.   MRN: [id number]  HPI  Note: Portions of this report may have been transcribed using voice recognition software. Every effort was made to ensure accuracy; however, inadvertent computerized transcription errors may be present.   Any transcriptional errors that result from this process are unintentional.       [name]  08-02-1956 [id number]  Patient Care Team: [name], MD as PCP - General (Family Medicine) Satira Sark, MD as Consulting Physician (Cardiology) Danie Binder, MD as Consulting Physician (Gastroenterology) Milus Banister, MD as Attending Physician (Gastroenterology) Farrel Gobble, MD as Consulting Physician (Medical Oncology) Adin Hector, MD as Consulting Physician (General Surgery)  Procedure (Date: 7/31/202015):  POST-OPERATIVE DIAGNOSIS: VERY LOW RECTAL CANCER  PROCEDURE: ROBOTIC ASSISTED low anterior resection  TRANSANAL TRANSABDOMINAL TME WITH HANDSEWN COLOANAL ANASTOMOSIS  splenic flexure mobilization  diverting loop ileostomy   Surgeon(s):  Adin Hector, MD  Leighton Ruff, MD - Asst  Procedure (Date 03/24/2014):   POST-OPERATIVE DIAGNOSIS  Massive ileus  Ischemia/necrosis of distal colon at coloanal anastomosis  PROCEDURE:  FLEXIBLE SIGMOIDOSCOPY  Exploratory laparotomy  Takedown of coloanal anastomosis  Distal colectomy & end colostomy  Wound vac  SURGEON: Surgeon(s):  Adin Hector, MD  ASSISTANT: Servando Snare, RNFA    This patient returns for surgical re-evaluation.  Comes today in a wheelchair.  Comes today with his parents. He is worn out but "doing all I can".  At [facility] with SNF/Rehab.  His calves cramp very bad when he stands up in the morning.  Wondering if he needs SCDs again.  The site his ear pops and is uncomfortable on the left side.  Had problems with difficulty urinating.  At catheter placed in emergency room  over weekend.  Told to  followup with urology.  Has not have that done.  Wound care doing okay.  Leeting the nurses do all of the ostomy care.    He is breathing well.  No more productive cough.  He is eating well.  Ileostomy empties 3-5 times a day.  Not dehydrated.  White count improving.  Nearly done with oral antibiotics.  No fevers or chills.  Patient Active Problem List   Diagnosis Date Noted  . Obesity (BMI 30-39.9) 03/24/2014  . ards 03/24/2014  . Aspiration pneumonia 03/24/2014  . Rectal cancer, uT2uN0, s/p LAR/ileostomy 03/20/2014 colostomy [id number] 12/25/2013  . Hemorrhoids, internal 12/25/2013  . GERD (gastroesophageal reflux disease) 11/27/2013  . Atypical chest pain 11/10/2013  . Tobacco abuse, in remission 11/10/2013    Past Medical History  Diagnosis Date  . GERD (gastroesophageal reflux disease)   . Shortness of breath     due to weight gain poststopping smokeing  . Arthritis   . Cancer     Past Surgical History  Procedure Laterality Date  . Colonoscopy N/A 12/22/2013    Procedure: COLONOSCOPY;  Surgeon: Danie Binder, MD;  Location: [facility];  Service: Endoscopy;  Laterality: N/A;  8:30  . Esophagogastroduodenoscopy N/A 12/22/2013    Procedure: ESOPHAGOGASTRODUODENOSCOPY (EGD);  Surgeon: Danie Binder, MD;  Location: [facility];  Service: Endoscopy;  Laterality: N/A;  . Eus N/A 01/01/2014    Procedure: LOWER ENDOSCOPIC ULTRASOUND (EUS);  Surgeon: Milus Banister, MD;  Location: [facility];  Service: Endoscopy;  Laterality: N/A;  . Hip fracture surgery Left age 89 on 04-06-1970  . Eye reattachment and  ear reattachment  Right 1971  . Colon resection N/A 03/24/2014    Procedure: Diagnostic  laparotomy, takedown of coloanal anastamosis, end colostomy, with wound vac;  Surgeon: Adin Hector, MD;  Location: [facility];  Service: General;  Laterality: N/A;  . Flexible sigmoidoscopy N/A 03/24/2014    Procedure: FLEXIBLE SIGMOIDOSCOPY;  Surgeon: Adin Hector, MD;   Location: [facility];  Service: General;  Laterality: N/A;    History   Social History  . Marital Status: Married    Spouse Name: N/A    Number of Children: N/A  . Years of Education: N/A   Occupational History  . Not on file.   Social History Main Topics  . Smoking status: Former Smoker -- 0.00 packs/day for 15 years    Types: Cigars    Quit date: 09/10/2011  . Smokeless tobacco: Never Used  . Alcohol Use: No     Comment: quit years ago  . Drug Use: No  . Sexual Activity: Not on file   Other Topics Concern  . 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    Family History  Problem Relation Age of Onset  . Colon polyps Neg Hx   . Colon cancer Sister     Current Outpatient Prescriptions  Medication Sig Dispense Refill  . aspirin EC 81 MG tablet Take 81 mg by mouth daily.      Marland Kitchen aspirin EC 81 MG tablet Take 1 tablet (81 mg total) by mouth daily.  30 tablet  0  . ciprofloxacin (CIPRO) 500 MG tablet Take 1 tablet (500 mg total) by mouth 2 (two) times daily.  20 tablet  2  . fluconazole (DIFLUCAN) 200 MG tablet Take 1 tablet (200 mg total) by mouth daily.  10 tablet  0  . Multiple Vitamins-Minerals (ONE DAILY FOR MEN 50+ ADVANCED PO) Take 1 tablet by mouth daily.      . Omega-3 Fatty Acids (FISH OIL) 1000 MG CAPS Take 1,000 mg by mouth daily.      Marland Kitchen omeprazole (PRILOSEC) 20 MG capsule Take 20 mg by mouth 2 (two) times daily.      . ondansetron (ZOFRAN) 4 MG tablet Take 1 tablet (4 mg total) by mouth every 8 (eight) hours as needed for nausea.  8 tablet  5  . OVER THE COUNTER MEDICATION Take 500 mg by mouth 3 (three) times daily. Athechel      . oxyCODONE (OXY IR/ROXICODONE) 5 MG immediate release tablet Take one or two tablets by mouth every 4 hours as needed for moderate, severe or breakthrough pain  360 tablet  0  . saw palmetto 160 MG capsule Take 160 mg by mouth daily.      . vitamin C (ASCORBIC ACID) 500 MG tablet Take 500 mg by mouth 2  (two) times daily.       No current facility-administered medications for this visit.     No Known Allergies  BP 136/72  Pulse 75  Temp(Src) 97.3 F (36.3 C)  Ht 5\' 4"  (1.626 m)  Wt 167 lb (75.751 kg)  BMI 28.65 kg/m2  Dg Chest 2 View  04/04/2014   CLINICAL DATA:  Followup of pneumonia. Recent surgery. Diminished breath sounds.  EXAM: CHEST  2 VIEW  COMPARISON:  03/29/2014  FINDINGS: Mild hyperinflation. Lateral view degraded by patient arm position. Midline trachea. Normal heart size. No pleural effusion or pneumothorax. Interstitial thickening is improved. No lobar consolidation.  IMPRESSION:  Improved interstitial thickening. This could represent the sequelae of chronic bronchitis or residual mild pulmonary venous congestion.  No lobar consolidation to suggest pneumonia.   Electronically Signed   By: Abigail Miyamoto M.D.   On: 04/04/2014 16:57   Ct Abdomen Pelvis W Contrast  04/05/2014   CLINICAL DATA:  Colon resection April 03, 2014, pain at incision site. Difficulty urinating.  EXAM: CT ABDOMEN AND PELVIS WITH CONTRAST  TECHNIQUE: Multidetector CT imaging of the abdomen and pelvis was performed using the standard protocol following bolus administration of intravenous contrast.  CONTRAST:  72mL OMNIPAQUE IOHEXOL 300 MG/ML SOLN, 176mL OMNIPAQUE IOHEXOL 300 MG/ML SOLN  COMPARISON:  CT of the abdomen and pelvis March 30, 2014  FINDINGS: LUNG BASES: Included view of the lung bases demonstrate diffuse micro nodularity, and persisting consolidation right lower lobe, to a lesser extent left lung base. Visualized heart and pericardium are unremarkable.  SOLID ORGANS: The liver, spleen, gallbladder, pancreas and adrenal glands are unremarkable.  GASTROINTESTINAL TRACT: Right lower quadrant ileostomy, left lower quadrant colostomy, status post partial colectomy. Fluid filled mildly distended small bowel measuring up to 3.7 cm, slightly decreased from prior imaging. Contrast does not reach the distal small  bowel. Stomach is unremarkable.  KIDNEYS/ URINARY TRACT: Kidneys are orthotopic, demonstrating symmetric enhancement. No nephrolithiasis, hydronephrosis or solid renal masses. The unopacified ureters are normal in course and caliber. Delayed imaging through the kidneys demonstrates symmetric prompt contrast excretion within the proximal urinary collecting system. Urinary bladder is decompressed containing a Foley catheter.  PERITONEUM/RETROPERITONEUM: Slight increase in size of 18 x 29 x 32 mm intraperitoneal fluid collection immediately deep to the laparotomy defect, axial 68/97, with surrounding inflammatory changes. Aortoiliac vessels are normal in course and caliber. No lymphadenopathy by CT size criteria. Internal reproductive organs are unremarkable. Interval removal of surgical drains.  SOFT TISSUE/OSSEOUS STRUCTURES: Midline laparotomy open wound, no free fluid or fluid collections within the subcutaneous fat. Severe left hip osteoarthrosis and deformity which may be posttraumatic.  IMPRESSION: Interval partial colectomy with left lower quadrant colostomy, right lower quadrant ileostomy. Slightly decreased fluid distended small bowel may reflect ileus, less likely obstruction.  Increasing intraperitoneal 18 x 29 x 32 mm fluid collection deep to the laparotomy defect concerning for abscess as, this is increased in size from prior imaging, status post interval removal of surgical drains.  Similar micro nodularity and consolidation in the lung bases.   Electronically Signed   By: Elon Alas   On: 04/05/2014 06:57   Ct Abdomen Pelvis W Contrast  03/30/2014   CLINICAL DATA:  Abdominal pain and nausea. Leukocytosis. Rectal cancer. Bowel ischemia.  EXAM: CT ABDOMEN AND PELVIS WITH CONTRAST  TECHNIQUE: Multidetector CT imaging of the abdomen and pelvis was performed using the standard protocol following bolus administration of intravenous contrast.  CONTRAST:  115mL OMNIPAQUE IOHEXOL 300 MG/ML SOLN, 64mL  OMNIPAQUE IOHEXOL 300 MG/ML SOLN  COMPARISON:  01/09/2014  FINDINGS: Diffuse micro nodularity in the lung bases. Bands of atelectasis in the right middle lobe and both lower lobes.  No compelling evidence of metastatic disease to the liver. The spleen, gallbladder, pancreas, and adrenal glands appear normal. A surgical drain extends from the right upper quadrant down the right paracolic gutter and into the lower pelvis in the vicinity of the prior low anterior resection.  Left transverse colostomy.  Right-sided enterostomy.  Kidneys and proximal ureters unremarkable. No ureteral calculus observed.  Retro aortic left renal vein. Small porta hepatis lymph nodes are present.  Laparotomy  wound noted.  Low grade stranding in the omentum.  Distended urinary bladder. Mildly dilated scattered loops of small bowel appear to extend to the right-sided enterostomy site; the small bowel distal to this is of very small caliber.  No definite abscess identified. There is abnormal subcutaneous edema along both hip margins and extending along the anterior upper thigh musculature bilaterally.  Markedly severe arthropathy of the left hip is present with deformity in the acetabulum and left femoral head.  IMPRESSION: 1. Mild small bowel dilatation with air-fluid levels, extending to the right-sided enterostomy. There may be some low grade partial obstruction due to tethering in this vicinity. 2. Diffuse micro nodularity in the lung bases. Given the context, this probably reflects an atypical infectious process. There is also mild atelectasis in both lung bases. 3. Surgical drain from the right upper quadrant to terminate at the site of the low anterior resection. 4. There is also a left transverse colostomy. The small bowel and colon between the antrostomy and colostomy is decompressed. 5. Low grade inflammatory stranding in the omentum, without abscess or overt ascites. 6. Severe chronic arthropathy of the left hip.   Electronically  Signed   By: Sherryl Barters M.D.   On: 03/30/2014 20:35   Dg Chest Port 1 View  03/29/2014   CLINICAL DATA:  58 year old male with aspiration pneumonia  EXAM: PORTABLE CHEST - 1 VIEW  COMPARISON:  03/28/2014 and prior chest radiographs dating back to 12/29/2013  FINDINGS: Cardiomediastinal silhouette is unchanged.  Bilateral airspace opacities are again noted without significant change.  There is no evidence of pleural effusion or pneumothorax.  No acute bony abnormalities are identified.  IMPRESSION: Unchanged bilateral airspace opacities from 03/28/2014.   Electronically Signed   By: Hassan Rowan M.D.   On: 03/29/2014 07:10   Dg Chest Port 1 View  03/28/2014   CLINICAL DATA:  PICC line removed, evaluate for any remaining catheter fragment  EXAM: PORTABLE CHEST - 1 VIEW  COMPARISON:  03/27/2014  FINDINGS: Moderate to severe diffuse interstitial change stable. Heart size upper normal and stable. NG tube is been removed. PICC line has been removed. No remaining PICC line fragments identified.  IMPRESSION: No catheter fragments identified.   Electronically Signed   By: Skipper Cliche M.D.   On: 03/28/2014 01:36   Dg Chest Port 1 View  03/27/2014   CLINICAL DATA:  Evaluate infiltrates  EXAM: PORTABLE CHEST - 1 VIEW  COMPARISON:  03/25/2014  FINDINGS: Tracheal extubation. Orogastric tube continues to cross the diaphragm, tip either in the distal stomach or proximal duodenum. Unchanged positioning of left upper extremity PICC.  Borderline cardiomegaly. Unchanged upper mediastinal contours. Diffuse interstitial coarsening is unchanged. This may represent pneumonitis given previous history of aspiration. No evidence of effusion or pneumothorax.  IMPRESSION: Stable pulmonary inflation after extubation.Unchanged interstitial coarsening, likely pneumonitis related to history of aspiration.   Electronically Signed   By: Jorje Guild M.D.   On: 03/27/2014 06:18   Dg Chest Port 1 View  03/25/2014   CLINICAL DATA:   Followup aspiration.  EXAM: PORTABLE CHEST - 1 VIEW  COMPARISON:  03/24/2014.  FINDINGS: Endotracheal tube ends just below the clavicular heads. Left upper extremity PICC tip at the superior cavoatrial junction. Orogastric tube remains in good position.  Normal heart size. Unchanged upper mediastinal contours. Interstitial coarsening, particularly perihilar, is mildly improved. There is suggestion of mild airspace nodularity in the left chest. No edema, effusion, or pneumothorax.  IMPRESSION: 1. Stable positioning of tubes  and central line. 2. Mild improvement in perihilar aeration. Persistent interstitial coarsening correlates with history of aspiration.   Electronically Signed   By: Jorje Guild M.D.   On: 03/25/2014 06:19   Dg Chest Port 1 View  03/24/2014   CLINICAL DATA:  History of colorectal carcinoma, aspiration by history  EXAM: PORTABLE CHEST - 1 VIEW  COMPARISON:  Chest x-ray of 12/29/2013  FINDINGS: There is airspace disease in the perihilar regions bilaterally which may represent pneumonia or edema. Followup chest x-ray is recommended. An endotracheal tube is present with the tip approximately 3.4 cm above the carina. Heart size is stable. NG tube coils in the fundus of the stomach.  IMPRESSION: 1. Bilateral perihilar airspace disease. Possible pneumonia but cannot exclude edema. Recommend followup. 2. Tip of endotracheal tube approximately 3.4 cm above the carina P   Electronically Signed   By: Ivar Drape M.D.   On: 03/24/2014 17:13   Dg Humerus Left  03/28/2014   CLINICAL DATA:  PICC line removed, evaluate for any remaining catheter  EXAM: LEFT HUMERUS - 2+ VIEW  COMPARISON:  None.  FINDINGS: Mild to moderate left shoulder arthritis. Humerus is normal with no fracture, dislocation, or focal osseous abnormality. No catheter fragments identified over the humerus or surrounding soft tissues.  IMPRESSION: No acute findings.  No catheter fragments identified.   Electronically Signed   By: Skipper Cliche M.D.   On: 03/28/2014 01:34     Review of Systems  Constitutional: Negative for fever, chills and diaphoresis.  HENT: Negative for sore throat and trouble swallowing.   Eyes: Negative for photophobia and visual disturbance.  Respiratory: Negative for choking and shortness of breath.   Cardiovascular: Negative for chest pain and palpitations.  Gastrointestinal: Negative for nausea, vomiting, abdominal distention, anal bleeding and rectal pain.  Genitourinary: Negative for dysuria, urgency, difficulty urinating and testicular pain.  Musculoskeletal: Negative for arthralgias, gait problem, myalgias and neck pain.  Skin: Negative for color change and rash.  Neurological: Negative for dizziness, speech difficulty, weakness and numbness.  Hematological: Negative for adenopathy.  Psychiatric/Behavioral: Negative for hallucinations, confusion and agitation.       Objective:   Physical Exam  Constitutional: He is oriented to person, place, and time. He appears well-developed and well-nourished. No distress.  HENT:  Head: Normocephalic.  Mouth/Throat: Oropharynx is clear and moist. No oropharyngeal exudate.  Eyes: Conjunctivae and EOM are normal. Pupils are equal, round, and reactive to light. No scleral icterus.  Neck: Normal range of motion. No tracheal deviation present.  Cardiovascular: Normal rate, normal heart sounds and intact distal pulses.   Pulmonary/Chest: Effort normal. No respiratory distress.  Abdominal: Soft. He exhibits no distension. There is no tenderness. There is no rigidity, no rebound, no guarding, no tenderness at McBurney's point and negative Murphy's sign. No hernia. Hernia confirmed negative in the right inguinal area and confirmed negative in the left inguinal area.    Incisions clean with normal healing ridges.  No hernias  Musculoskeletal: Normal range of motion. He exhibits no tenderness.  Neurological: He is alert and oriented to person, place, and time.  No cranial nerve deficit. He exhibits normal muscle tone. Coordination normal.  Skin: Skin is warm and dry. No rash noted. He is not diaphoretic.  Psychiatric: His behavior is normal. His mood appears anxious. His affect is not angry, not blunt, not labile and not inappropriate. He exhibits a depressed mood.  A little tearful but consolable.  Pleasantly defensive "I am doing  everything I can"       Assessment:     Struggling but gradually improving    Plan:     Complete antibiotics per infectious disease.  Nearly done.  Small fluid collection in central abdomen asymptomatic.  Probably seroma.  White count improving.  Ileus resolved.  I would not more aggressively treat, especially since <3cm & already on antibiotics  Set up appointment with [facility] to help with recurrent urinary retention.  Called yesterday.  Will call again today.  Start Flomax.  I had already done a prescription.  It seems to have been lost.  I will do another one .  Jobst stockings for leg support.  No evidence of DVT on exam.  Most likely due to arthritis.  Try Claritin for probable eustachian tube inflammation causing popping ear.  Not severe.  Increase activity as tolerated to regular activity.  Low impact exercise such as walking an hour a day at least ideal.  Do not push through pain.  Diet as tolerated.  Low fat high fiber diet ideal.  Bowel regimen with 30 g fiber a day and fiber supplement as needed to avoid problems.  Supplemental shakes.  Ostomy care/training.  I stressed to him that he needs to be involved in that.  Continue wound care  Return to clinic q3-6 weeks, sooner as needed.   Instructions discussed.  Followup with primary care physician for other health issues as would normally be done.  Consider screening for malignancies (breast, prostate, colon, melanoma, etc) as appropriate.  Questions answered.  The patient expressed understanding and appreciation

## 2014-04-07 NOTE — Telephone Encounter (Signed)
Called to see about the referral for getting the pt seen this week with a urologist. Shirlean Mylar in triage asked for me to fax the office note from today to [phone]. They will work on appt and call pt with the time for this week.

## 2014-04-08 NOTE — Progress Notes (Addendum)
Patient ID: [name], male   DOB: 04-03-1956, 58 y.o.   MRN: [id number]            HISTORY & PHYSICAL  DATE:  04/06/2014    FACILITY: [facility]    LEVEL OF CARE:   SNF   CHIEF COMPLAINT:  Admission to SNF, post stay at [facility], 03/20/2014 through 04/02/2014.    HISTORY OF PRESENT ILLNESS:  This is a gentleman who presented, I believe initially, to Dr. Oneida Alar of Gastroenterology with a history of rectal bleeding.  He had a colonoscopy done, which showed a very low rectal cancer.    On this occasion, he presented for surgical treatment of this condition.  His preoperative CT scan did not show distant metastasis.  He presented on this occasion, I believe electively, for a coloanal resection and ileostomy on 03/20/2014.    He developed persistent tachycardia postoperatively, and an ileus.  There was no evidence of infection.    He went back to the operating room and endoscopy revealed severe ischemia/necrosis of the anastomosis.   The anastomosis was resected and converted to a colostomy.  Perioperatively, he developed a massive emesis and aspiration requiring intubation and was in the Intensive Care Unit for 48 hours.  He extubated, transitioned to the floor, his ileus resolved, his diet advanced.    He had evidence of candida on one blood culture and Pseudomonas in his tracheal aspirate.  Infectious Disease consultation.    Apparently over the weekend while in this facility, he became tachycardic.  An EKG showed tachycardia.  He has been complaining of left lower quadrant pericolostomy pain.  He was sent to the emergency room.  There, an increase in size, 18 x 29 x 32 mm, intraperitoneal fluid collection immediately deep to the laparotomy defect with surrounding inflammatory changes.  This, according to the radiologist, was concerning for an abscess as there was slight increase in size from previous image.    His white count today is 14 with 78% neutrophils.  Platelet  count is 752,000.  Hemoglobin is 7.8.  His white count was 16.7 when he left the hospital on 04/05/2014, and 21.5 on 04/02/2014.    Also when in the ER over the weekend, he was discovered to be in urinary retention.  He had a Foley catheter placed for 900 cc of urine.  He did have preoperative urinary frequency without obvious hesitancy or other issues.  He states he has had his prostate checked at the "free clinic".  There are free prostate checks there annually.  I am not really familiar with this.    PAST MEDICAL HISTORY/PROBLEM LIST:  In the 1970s, had a traumatic fracture of his proximal left femur, I believe, leaving him with chronic shortening of his left leg and pain when he is walking.  He also walks with a "lurch" in his gait.  One of his CT scans of the abdomen and pelvis shows severe chronic arthropathy of the left hip.    CT scans also suggest diverticulosis without diverticulitis.    CURRENT MEDICATIONS:  Medication list is reviewed.    SOCIAL HISTORY: HOUSING:  Lives in [place].   Lives with his wife, who is present.   EMPLOYMENT HISTORY:  He is a Psychologist, sport and exercise.    REVIEW OF SYSTEMS:   CHEST/RESPIRATORY:  He is not complaining of cough or shortness of breath.   CARDIAC:   No chest pain.    GI:  Left lower quadrant abdominal pain.  He is also complaining of sweating/night sweats.   GU:  Urinary difficulties, now with a Foley catheter.  He does not have known prostate issues.    PHYSICAL EXAMINATION:   VITAL SIGNS:   TEMPERATURE:  99.4.   BLOOD PRESSURE:  128/80.   PULSE:  92.   RESPIRATIONS:  20.   O2 SATURATIONS:  95% on room air.    GENERAL APPEARANCE:  Pleasant man in no distress.   CHEST/RESPIRATORY:  Clear air entry bilaterally.   CARDIOVASCULAR:  CARDIAC:   Heart sounds are normal.  There are no murmurs.    GASTROINTESTINAL:  ABDOMEN:   Ileostomy in the right upper quadrant.  Left lower quadrant colostomy.  There is significant pericolostomy tenderness here.    GENITOURINARY:   PROSTATE:  He has, I believe, an anal over-sew.  I cannot check his prostate.   BLADDER:  Foley is draining cloudy urine.   NEUROLOGICAL:    SENSATION/STRENGTH:  Extremities:  He has some proximal left leg weakness to hip flexion.   DEEP TENDON REFLEXES:  Reflexes in the knee jerks and ankle jerks are normal.  Babinski response is downgoing.    ASSESSMENT/PLAN:  Very low rectal cancer with initial resection and coloanal anastomosis which had to be reversed after a significant rocky perioperative course with ileus.  He also has an ileostomy.  He will need teaching in both of these issues.   I suspect there will be no attempt to reconnect him for several months.  His surgical wound looks very stable.  Clean granulation tissue.  I see no issues here.    Pericolostomy abscess.  I am concerned about this.  He also had a single blood culture that showed candida while in the hospital.  I am actually going to repeat his blood cultures today.    Aspiration pneumonitis.  This appears to be stable at the moment.  I will repeat his chest x-ray.    Diaphoresis, etc.  I suspect this is related to the abscess.  He has an appointment with Dr. Johney Maine tomorrow.  Whether this can be handled at [facility] will be the issue here.

## 2014-04-09 ENCOUNTER — Encounter (HOSPITAL_COMMUNITY): Payer: Medicaid Other | Attending: Hematology and Oncology

## 2014-04-09 ENCOUNTER — Encounter (HOSPITAL_COMMUNITY): Payer: Self-pay

## 2014-04-09 VITALS — BP 107/74 | HR 116 | Temp 98.6°F | Resp 18 | Wt 162.0 lb

## 2014-04-09 DIAGNOSIS — C20 Malignant neoplasm of rectum: Secondary | ICD-10-CM

## 2014-04-09 DIAGNOSIS — D75839 Thrombocytosis, unspecified: Secondary | ICD-10-CM

## 2014-04-09 DIAGNOSIS — K559 Vascular disorder of intestine, unspecified: Secondary | ICD-10-CM

## 2014-04-09 DIAGNOSIS — J449 Chronic obstructive pulmonary disease, unspecified: Secondary | ICD-10-CM

## 2014-04-09 DIAGNOSIS — D473 Essential (hemorrhagic) thrombocythemia: Secondary | ICD-10-CM

## 2014-04-09 DIAGNOSIS — J309 Allergic rhinitis, unspecified: Secondary | ICD-10-CM

## 2014-04-09 LAB — COMPREHENSIVE METABOLIC PANEL
ALT: 88 U/L — ABNORMAL HIGH (ref 0–53)
AST: 43 U/L — AB (ref 0–37)
Albumin: 1.9 g/dL — ABNORMAL LOW (ref 3.5–5.2)
Alkaline Phosphatase: 343 U/L — ABNORMAL HIGH (ref 39–117)
Anion gap: 14 (ref 5–15)
BILIRUBIN TOTAL: 0.3 mg/dL (ref 0.3–1.2)
BUN: 12 mg/dL (ref 6–23)
CO2: 24 mEq/L (ref 19–32)
CREATININE: 0.74 mg/dL (ref 0.50–1.35)
Calcium: 8.7 mg/dL (ref 8.4–10.5)
Chloride: 98 mEq/L (ref 96–112)
GFR calc Af Amer: >90 mL/min (ref >90)
GFR calc non Af Amer: >90 mL/min (ref >90)
Glucose, Bld: 161 mg/dL — ABNORMAL HIGH (ref 70–99)
Potassium: 4.6 mEq/L (ref 3.7–5.3)
Sodium: 136 mEq/L — ABNORMAL LOW (ref 137–147)
Total Protein: 7.5 g/dL (ref 6.0–8.3)

## 2014-04-09 LAB — CBC WITH DIFFERENTIAL/PLATELET
BASOS ABS: 0 10*3/uL (ref 0.0–0.1)
Basophils Relative: 0 % (ref 0–1)
Eosinophils Absolute: 0.3 10*3/uL (ref 0.0–0.7)
Eosinophils Relative: 2 % (ref 0–5)
HEMATOCRIT: 28.9 % — AB (ref 39.0–52.0)
HEMOGLOBIN: 9.1 g/dL — AB (ref 13.0–17.0)
LYMPHS PCT: 10 % — AB (ref 12–46)
Lymphs Abs: 1.2 10*3/uL (ref 0.7–4.0)
MCH: 29.6 pg (ref 26.0–34.0)
MCHC: 31.5 g/dL (ref 30.0–36.0)
MCV: 94.1 fL (ref 78.0–100.0)
MONO ABS: 1 10*3/uL (ref 0.1–1.0)
MONOS PCT: 8 % (ref 3–12)
Neutro Abs: 9.4 10*3/uL — ABNORMAL HIGH (ref 1.7–7.7)
Neutrophils Relative %: 80 % — ABNORMAL HIGH (ref 43–77)
Platelets: 702 10*3/uL — ABNORMAL HIGH (ref 150–400)
RBC: 3.07 MIL/uL — AB (ref 4.22–5.81)
RDW: 15.4 % (ref 11.5–15.5)
WBC: 11.9 10*3/uL — ABNORMAL HIGH (ref 4.0–10.5)

## 2014-04-09 NOTE — Progress Notes (Signed)
[facility]  OFFICE PROGRESS NOTE  Rubbie Battiest, MD [address] [place] [address]  DIAGNOSIS: Rectal cancer - Plan: CBC with Differential, Comprehensive metabolic panel, CEA  Thrombocytosis  Colitis, ischemic  Chief Complaint  Patient presents with  . Rectal Cancer    CURRENT THERAPY: Transanal excision of rectal carcinoma on 03/20/2014. Episode of ischemic colitis requiring colon resection on 03/24/2014. Intra-abdominal abscess diagnosed on 04/05/2014  INTERVAL HISTORY: GAR GLANCE 58 y.o. male returns for followup after surgery for early stage rectal cancer. He underwent transanal excision and subsequently developed ischemic colitis resecting a large portion of his large intestine. He has a colostomy and ileostomy at this time. His primary complaints or lower extremity swelling with stuffy years. He denies any epistaxis or hemoptysis but does have chronic cough. He did have low-grade fever this morning. He denies abdominal distention, nausea, vomiting, melena, hematochezia, and does have a indwelling Foley catheter. He denies any lower extremity redness and is scheduled to get TED stockings.  MEDICAL HISTORY: Past Medical History  Diagnosis Date  . GERD (gastroesophageal reflux disease)   . Shortness of breath     due to weight gain poststopping smokeing  . Arthritis   . Cancer   . Aspiration pneumonia 03/24/2014  . Atypical chest pain 11/10/2013    INTERIM HISTORY: has Atypical chest pain; Tobacco abuse, in remission; GERD (gastroesophageal reflux disease); Rectal cancer, uT2uN0, s/p LAR/ileostomy 03/20/2014 colostomy [id number]; Hemorrhoids, internal; Obesity (BMI 30-39.9); Acute urinary retention; and Eustachian tube dysfunction on his problem list.   Procedure (Date: 7/31/202015):  POST-OPERATIVE DIAGNOSIS: VERY LOW RECTAL CANCER  PROCEDURE: ROBOTIC ASSISTED low anterior resection  TRANSANAL TRANSABDOMINAL TME WITH  HANDSEWN COLOANAL ANASTOMOSIS  splenic flexure mobilization  diverting loop ileostomy  Surgeon(s):  Adin Hector, MD  Leighton Ruff, MD - Asst  Procedure (Date 03/24/2014):  POST-OPERATIVE DIAGNOSIS  Massive ileus  Ischemia/necrosis of distal colon at coloanal anastomosis  PROCEDURE:  FLEXIBLE SIGMOIDOSCOPY  Exploratory laparotomy  Takedown of coloanal anastomosis  Distal colectomy & end colostomy  Wound vac  SURGEON: Surgeon(s):  Adin Hector, MD  ASSISTANT: Servando Snare, RNFA   ALLERGIES:  has No Known Allergies.  MEDICATIONS: has a current medication list which includes the following prescription(s): aspirin ec, ciprofloxacin, fluconazole, multiple vitamins-minerals, omeprazole, ondansetron, oxycodone, saw palmetto, vitamin c, jobst for men 15-104mhg med, loratadine, fish oil, OVER THE COUNTER MEDICATION, and tamsulosin.  SURGICAL HISTORY:  Past Surgical History  Procedure Laterality Date  . Colonoscopy N/A 12/22/2013    Procedure: COLONOSCOPY;  Surgeon: SDanie Binder MD;  Location[facility];  Service: Endoscopy;  Laterality: N/A;  8:30  . Esophagogastroduodenoscopy N/A 12/22/2013    Procedure: ESOPHAGOGASTRODUODENOSCOPY (EGD);  Surgeon: SDanie Binder MD;  [facility];  Service: Endoscopy;  Laterality: N/A;  . Eus N/A 01/01/2014    Procedure: LOWER ENDOSCOPIC ULTRASOUND (EUS);  Surgeon: DMilus Banister MD;  [facility];  Service: Endoscopy;  Laterality: N/A;  . Hip fracture surgery Left age [age]on 04-06-1970  . Eye reattachment and ear reattachment  Right 1971  . Colon resection N/A 03/24/2014    Procedure: Diagnostic  laparotomy, takedown of coloanal anastamosis, end colostomy, with wound vac;  Surgeon: SAdin Hector MD;  Location: [facility];  Service: General;  Laterality: N/A;  . Flexible sigmoidoscopy N/A 03/24/2014    Procedure: FLEXIBLE SIGMOIDOSCOPY;  Surgeon: SAdin Hector MD;  [facility];  Service: General;  Laterality: N/A;     FAMILY HISTORY: family history includes Colon cancer in his sister. There is no history of Colon polyps.  SOCIAL HISTORY:  reports that he quit smoking about 2 years ago. His smoking use included Cigars. He has never used smokeless tobacco. He reports that he does not drink alcohol or use illicit drugs.  REVIEW OF SYSTEMS:  Other than that discussed above is noncontributory.  PHYSICAL EXAMINATION: ECOG PERFORMANCE STATUS: 2 - Symptomatic, <50% confined to bed  Blood pressure 107/74, pulse 116, temperature 98.6 F (37 C), temperature source Oral, resp. rate 18, weight 162 lb (73.483 kg), SpO2 96.00%.  GENERAL:alert, no distress and comfortable SKIN: skin color, texture, turgor are normal, no rashes or significant lesions EYES: PERLA; Conjunctiva are pink and non-injected, sclera clear EARS: Bilateral serous otitis. SINUSES: No redness or tenderness over maxillary or ethmoid sinuses OROPHARYNX:no exudate, no erythema on lips, buccal mucosa, or tongue. NECK: supple, thyroid normal size, non-tender, without nodularity. No masses CHEST: Increased AP diameter with no breast masses. LYMPH:  no palpable lymphadenopathy in the cervical, axillary or inguinal LUNGS: clear to auscultation and percussion with normal breathing effort HEART: regular rate & rhythm and no murmurs. ABDOMEN:abdomen soft, non-tender and normal bowel sounds. Colostomy and ileostomy in place with no free fluid wave or shifting dullness. MUSCULOSKELETAL:no cyanosis of digits and no clubbing. Range of motion normal. Bilateral +2 pitting edema with negative Homans sign. NEURO: alert & oriented x 3 with fluent speech, no focal motor/sensory deficits   LABORATORY DATA: Lab on 04/09/2014  Component Date Value Ref Range Status  . WBC 04/09/2014 11.9* 4.0 - 10.5 K/uL Final  . RBC 04/09/2014 3.07* 4.22 - 5.81 MIL/uL Final  . Hemoglobin 04/09/2014 9.1* 13.0 - 17.0 g/dL Final  . HCT 04/09/2014 28.9* 39.0 - 52.0 % Final  .  MCV 04/09/2014 94.1  78.0 - 100.0 fL Final  . MCH 04/09/2014 29.6  26.0 - 34.0 pg Final  . MCHC 04/09/2014 31.5  30.0 - 36.0 g/dL Final  . RDW 04/09/2014 15.4  11.5 - 15.5 % Final  . Platelets 04/09/2014 702* 150 - 400 K/uL Final  . Neutrophils Relative % 04/09/2014 80* 43 - 77 % Final  . Neutro Abs 04/09/2014 9.4* 1.7 - 7.7 K/uL Final  . Lymphocytes Relative 04/09/2014 10* 12 - 46 % Final  . Lymphs Abs 04/09/2014 1.2  0.7 - 4.0 K/uL Final  . Monocytes Relative 04/09/2014 8  3 - 12 % Final  . Monocytes Absolute 04/09/2014 1.0  0.1 - 1.0 K/uL Final  . Eosinophils Relative 04/09/2014 2  0 - 5 % Final  . Eosinophils Absolute 04/09/2014 0.3  0.0 - 0.7 K/uL Final  . Basophils Relative 04/09/2014 0  0 - 1 % Final  . Basophils Absolute 04/09/2014 0.0  0.0 - 0.1 K/uL Final  . Sodium 04/09/2014 136* 137 - 147 mEq/L Final  . Potassium 04/09/2014 4.6  3.7 - 5.3 mEq/L Final  . Chloride 04/09/2014 98  96 - 112 mEq/L Final  . CO2 04/09/2014 24  19 - 32 mEq/L Final  . Glucose, Bld 04/09/2014 161* 70 - 99 mg/dL Final  . BUN 04/09/2014 12  6 - 23 mg/dL Final  . Creatinine, Ser 04/09/2014 0.74  0.50 - 1.35 mg/dL Final  . Calcium 04/09/2014 8.7  8.4 - 10.5 mg/dL Final  . Total Protein 04/09/2014 7.5  6.0 - 8.3 g/dL Final  . Albumin 04/09/2014 1.9* 3.5 - 5.2 g/dL Final  .  AST 04/09/2014 43* 0 - 37 U/L Final  . ALT 04/09/2014 88* 0 - 53 U/L Final  . Alkaline Phosphatase 04/09/2014 343* 39 - 117 U/L Final  . Total Bilirubin 04/09/2014 0.3  0.3 - 1.2 mg/dL Final  . GFR calc non Af Amer 04/09/2014 >90  >90 mL/min Final  . GFR calc Af Amer 04/09/2014 >90  >90 mL/min Final   Comment: (NOTE)                          The eGFR has been calculated using the CKD EPI equation.                          This calculation has not been validated in all clinical situations.                          eGFR's persistently <90 mL/min signify possible Chronic Kidney                          Disease.  . Anion gap 04/09/2014  14  5 - 15 Final  Admission on 04/05/2014, Discharged on 04/05/2014  Component Date Value Ref Range Status  . WBC 04/05/2014 16.7* 4.0 - 10.5 K/uL Final  . RBC 04/05/2014 2.74* 4.22 - 5.81 MIL/uL Final  . Hemoglobin 04/05/2014 8.4* 13.0 - 17.0 g/dL Final  . HCT 04/05/2014 26.3* 39.0 - 52.0 % Final  . MCV 04/05/2014 96.0  78.0 - 100.0 fL Final  . MCH 04/05/2014 30.7  26.0 - 34.0 pg Final  . MCHC 04/05/2014 31.9  30.0 - 36.0 g/dL Final  . RDW 04/05/2014 16.2* 11.5 - 15.5 % Final  . Platelets 04/05/2014 780* 150 - 400 K/uL Final  . Neutrophils Relative % 04/05/2014 82* 43 - 77 % Final  . Neutro Abs 04/05/2014 13.6* 1.7 - 7.7 K/uL Final  . Lymphocytes Relative 04/05/2014 10* 12 - 46 % Final  . Lymphs Abs 04/05/2014 1.7  0.7 - 4.0 K/uL Final  . Monocytes Relative 04/05/2014 6  3 - 12 % Final  . Monocytes Absolute 04/05/2014 1.0  0.1 - 1.0 K/uL Final  . Eosinophils Relative 04/05/2014 2  0 - 5 % Final  . Eosinophils Absolute 04/05/2014 0.4  0.0 - 0.7 K/uL Final  . Basophils Relative 04/05/2014 0  0 - 1 % Final  . Basophils Absolute 04/05/2014 0.0  0.0 - 0.1 K/uL Final  . Sodium 04/05/2014 135* 137 - 147 mEq/L Final  . Potassium 04/05/2014 4.3  3.7 - 5.3 mEq/L Final  . Chloride 04/05/2014 101  96 - 112 mEq/L Final  . CO2 04/05/2014 26  19 - 32 mEq/L Final  . Glucose, Bld 04/05/2014 115* 70 - 99 mg/dL Final  . BUN 04/05/2014 13  6 - 23 mg/dL Final  . Creatinine, Ser 04/05/2014 0.69  0.50 - 1.35 mg/dL Final  . Calcium 04/05/2014 8.4  8.4 - 10.5 mg/dL Final  . Total Protein 04/05/2014 6.4  6.0 - 8.3 g/dL Final  . Albumin 04/05/2014 1.7* 3.5 - 5.2 g/dL Final  . AST 04/05/2014 28  0 - 37 U/L Final  . ALT 04/05/2014 37  0 - 53 U/L Final  . Alkaline Phosphatase 04/05/2014 268* 39 - 117 U/L Final  . Total Bilirubin 04/05/2014 0.3  0.3 - 1.2 mg/dL Final  . GFR calc non Af Amer 04/05/2014 >90  >  90 mL/min Final  . GFR calc Af Amer 04/05/2014 >90  >90 mL/min Final   Comment: (NOTE)                           The eGFR has been calculated using the CKD EPI equation.                          This calculation has not been validated in all clinical situations.                          eGFR's persistently <90 mL/min signify possible Chronic Kidney                          Disease.  . Anion gap 04/05/2014 8  5 - 15 Final  . Color, Urine 04/05/2014 YELLOW  YELLOW Final  . APPearance 04/05/2014 CLEAR  CLEAR Final  . Specific Gravity, Urine 04/05/2014 1.025  1.005 - 1.030 Final  . pH 04/05/2014 6.5  5.0 - 8.0 Final  . Glucose, UA 04/05/2014 NEGATIVE  NEGATIVE mg/dL Final  . Hgb urine dipstick 04/05/2014 NEGATIVE  NEGATIVE Final  . Bilirubin Urine 04/05/2014 NEGATIVE  NEGATIVE Final  . Ketones, ur 04/05/2014 NEGATIVE  NEGATIVE mg/dL Final  . Protein, ur 04/05/2014 NEGATIVE  NEGATIVE mg/dL Final  . Urobilinogen, UA 04/05/2014 0.2  0.0 - 1.0 mg/dL Final  . Nitrite 04/05/2014 NEGATIVE  NEGATIVE Final  . Leukocytes, UA 04/05/2014 NEGATIVE  NEGATIVE Final   MICROSCOPIC NOT DONE ON URINES WITH NEGATIVE PROTEIN, BLOOD, LEUKOCYTES, NITRITE, OR GLUCOSE <1000 mg/dL.  . Lipase 04/05/2014 45  11 - 59 U/L Final  . Lactic Acid, Venous 04/05/2014 1.5  0.5 - 2.2 mmol/L Final  . Specimen Description 04/05/2014 URINE, CATHETERIZED   Final  . Special Requests 04/05/2014 NONE   Final  . Culture  Setup Time 04/05/2014    Final                   Value:04/05/2014 21:20                         Performed at [facility]  . Colony Count 04/05/2014    Final                   Value:NO GROWTH                         Performed at [facility]  . Culture 04/05/2014    Final                   Value:NO GROWTH                         Performed at [facility]  . Report Status 04/05/2014 04/06/2014 FINAL   Final  Admission on 03/20/2014, Discharged on 04/02/2014  No results displayed because visit has over 200 results.      PATHOLOGY:  for ESTELLE, SKIBICKI ([id number]) Patient: KJUAN, SEIPP Collected: 03/20/2014 Client: [facility] Accession: [id number] Received: 03/20/2014 Michael Boston, MD DOB: [birth year] Age: 72 Gender: M Reported: 03/24/2014 [address] Patient Ph: [phone] MRN #: [id number] [place], [place] [address] Visit #: [id number] Chart #: Phone: [phone] Fax: CC: Quillian Quince  Dellia Beckwith Luking Barney Drain, MD REPORT OF SURGICAL PATHOLOGY FINAL DIAGNOSIS Diagnosis Colon, segmental resection for tumor, rectosigmoid very low cancer - INVASIVE ADENOCARCINOMA, MODERATELY DIFFERENTIATED. - TUMOR INVADES INTO MUSCULARIS PROPRIA. - RESECTION MARGINS ARE NEGATIVE. - NINE OF NINE LYMPH NODES NEGATIVE FOR CARCINOMA (0/9). - DIVERTICULOSIS. - SEE ONCOLOGY TABLE. Microscopic Comment COLON AND RECTUM (INCLUDING TRANS-ANAL RESECTION): Specimen: Rectosigmoid colon. Procedure: Low anterior resection. Tumor site: Distal 1/3 rectum. Specimen integrity: Intact. Macroscopic intactness of mesorectum: Complete. Macroscopic tumor perforation: Not identified. Invasive tumor: Maximum size: 2.5 cm. Histologic type(s): Invasive adenocarcinoma. Histologic grade and differentiation: G2, moderate differentiated. G1: well differentiated/low grade G2: moderately differentiated/low grade G3: poorly differentiated/high grade G4: undifferentiated/high grade Type of polyp in which invasive carcinoma arose: N/A. Microscopic extension of invasive tumor: Invades muscularis propria. Lymph-Vascular invasion: Not identified. Peri-neural invasion: Not identified. Tumor deposit(s) (discontinuous extramural extension): Not identified. Resection margins: Negative. Proximal margin: Negative. Distal margin: Negative. Circumferential (radial) (posterior ascending, posterior descending; lateral 1 of 3 FINAL for Croak, Mikko L ([id number]) Microscopic Comment(continued) and posterior mid-rectum; and entire lower 1/3 rectum): Negative. Mesenteric margin (sigmoid and  transverse): Negative. Distance closest margin (if all above margins negative): 0.4 cm circumferential. Treatment effect (neo-adjuvant therapy): N/A. Additional polyp(s): N/A. Non-neoplastic findings: Diverticulosis. Lymph nodes: number examined 9; number positive: 0 Pathologic Staging: pT2, pN0, MX Ancillary studies: Biopsy ([id number]) MMR testing showed no microsatellite instability. Can be repeated upon request. Vicente Males MD Pathologist, Electronic Signature (Case signed 03/24/2014) Specimen Gross and Clinical Information Specimen(s) Obtained: Colon, segmental resection for tumor, rectosigmoid very low cancer Specimen Clinical Information Very low rectal cancer Ardeen Fillers) Gross Specimen: Rectosigmoid Specimen integrity: Intact Specimen length: 53 cm Mesorectal intactness: Complete, inked black at time of gross Tumor location: Distal 1/3 of rectum Tumor size: There is a 2.5 cm in length, 2 cm width and up to 0.5 cm thick tan pink to hyperemic well defined sessile mass Percent of bowel circumference involved: 15 to 20% Tumor distance to margins Proximal: 48 cm Distal: 2.5 cm Mesenteric (sigmoid and transverse): 9 cm Radial (posterior ascending, posterior descending; lateral and posterior mid-rectum; and entire lower 1/3 rectum): 3 cm to the perirectal soft tissue margin Macroscopic extent of tumor invasion: The muscularis beneath the mass is focally ill defined, however there is no gross complete transection of the muscularis. Total presumed lymph nodes: Found within the fat on initial exam are eight possible lymph nodes ranging from 0.2 to 0.7cm. The fat will be examined for additional lymph nodes following fixation in clearing solution. Extramural satellite tumor nodules: None Mucosal polyp(s): None Additional findings: There are multiple shallow unperforated diverticula scattered throughout the proximal to mid segment. Block summary: A = proximal margin B, C = shave of  entire distal margin D-G = mass, entirely submitted H = diverticulum I = four nodes, whole J = four nodes, whole K = two possible lymph nodes found after clearing fat. Total = ten blocks (SSW:kh 03-22-14) 2 of 3 FINAL for Rolland, Kenmar [id number]) Report signed out from the following location(s) Technical Component and Interpretation performed at [facility], [place], [place] [address].  for KNOXX, BOEDING ([id number]) Patient: LAKE, CINQUEMANI Collected: 03/24/2014 Client: [facility] Accession: [id number] Received: 03/25/2014 Michael Boston, MD DOB: 11-Sep-1955 Age: 8 Gender: M Reported: 03/26/2014 [address] Patient Ph: [phone] MRN #: [id number] [place],  [address] Visit #: [id number] Chart #: Phone: [phone] Fax: CC: REPORT OF SURGICAL PATHOLOGY FINAL DIAGNOSIS Diagnosis Colon, segmental resection,  distal - DIFFUSE ISCHEMIC CHANGE AND ISCHEMIC NECROSIS. - ONE RESECTION MARGIN IS NECROTIC. - NO EVIDENCE OF MALIGNANCY. Vicente Males MD Pathologist, Electronic Signature (Case signed 03/26/2014) Specimen Gross and Clinical Information Specimen(s) Obtained: Colon, segmental resection, distal Specimen Clinical Information ileus, pain, possible colon ischemia (ajc) Gross Received in formalin is a 17 cm segment of colon. 7 cm from one margin there is a 1 cm transmural sutured defect. The serosa is dull, red brown and partially covered by adhesions. The mucosa throughout is edematous, tan to hyperemic with greenish brown discoloration in the mid portion. There are blood vessels containing clotted blood in the attached fat. Sections are submitted in six cassettes. A,B= margins of resection. C= sections at sutured defect. D,E= representative sections from mid portion of segment. F= sections of attached fat. (GP:gt, 03/25/14) Report signed out from the following location(s) Technical Component performed at [facility]. [address],[place] [address].ZNBV:[id number]., Interpretation performed at [facility], [place], [place] [address]. CLIA #: [id number],   Urinalysis    Component Value Date/Time   COLORURINE YELLOW 04/05/2014 0506   APPEARANCEUR CLEAR 04/05/2014 0506   LABSPEC 1.025 04/05/2014 0506   PHURINE 6.5 04/05/2014 0506   GLUCOSEU NEGATIVE 04/05/2014 0506   HGBUR NEGATIVE 04/05/2014 0506   BILIRUBINUR NEGATIVE 04/05/2014 0506   KETONESUR NEGATIVE 04/05/2014 0506   PROTEINUR NEGATIVE 04/05/2014 0506   UROBILINOGEN 0.2 04/05/2014 0506   NITRITE NEGATIVE 04/05/2014 0506   LEUKOCYTESUR NEGATIVE 04/05/2014 0506    RADIOGRAPHIC STUDIES: Dg Chest 2 View  04/04/2014   CLINICAL DATA:  Followup of pneumonia. Recent surgery. Diminished breath sounds.  EXAM: CHEST  2 VIEW  COMPARISON:  03/29/2014  FINDINGS: Mild hyperinflation. Lateral view degraded by patient arm position. Midline trachea. Normal heart size. No pleural effusion or pneumothorax. Interstitial thickening is improved. No lobar consolidation.  IMPRESSION: Improved interstitial thickening. This could represent the sequelae of chronic bronchitis or residual mild pulmonary venous congestion.  No lobar consolidation to suggest pneumonia.   Electronically Signed   By: Abigail Miyamoto M.D.   On: 04/04/2014 16:57   Ct Abdomen Pelvis W Contrast  04/05/2014   CLINICAL DATA:  Colon resection April 03, 2014, pain at incision site. Difficulty urinating.  EXAM: CT ABDOMEN AND PELVIS WITH CONTRAST  TECHNIQUE: Multidetector CT imaging of the abdomen and pelvis was performed using the standard protocol following bolus administration of intravenous contrast.  CONTRAST:  18m OMNIPAQUE IOHEXOL 300 MG/ML SOLN, 1055mOMNIPAQUE IOHEXOL 300 MG/ML SOLN  COMPARISON:  CT of the abdomen and pelvis March 30, 2014  FINDINGS: LUNG BASES: Included view of the lung bases demonstrate diffuse micro nodularity, and  persisting consolidation right lower lobe, to a lesser extent left lung base. Visualized heart and pericardium are unremarkable.  SOLID ORGANS: The liver, spleen, gallbladder, pancreas and adrenal glands are unremarkable.  GASTROINTESTINAL TRACT: Right lower quadrant ileostomy, left lower quadrant colostomy, status post partial colectomy. Fluid filled mildly distended small bowel measuring up to 3.7 cm, slightly decreased from prior imaging. Contrast does not reach the distal small bowel. Stomach is unremarkable.  KIDNEYS/ URINARY TRACT: Kidneys are orthotopic, demonstrating symmetric enhancement. No nephrolithiasis, hydronephrosis or solid renal masses. The unopacified ureters are normal in course and caliber. Delayed imaging through the kidneys demonstrates symmetric prompt contrast excretion within the proximal urinary collecting system. Urinary bladder is decompressed containing a Foley catheter.  PERITONEUM/RETROPERITONEUM: Slight increase in size of 18 x 29 x 32 mm intraperitoneal fluid collection immediately  deep to the laparotomy defect, axial 68/97, with surrounding inflammatory changes. Aortoiliac vessels are normal in course and caliber. No lymphadenopathy by CT size criteria. Internal reproductive organs are unremarkable. Interval removal of surgical drains.  SOFT TISSUE/OSSEOUS STRUCTURES: Midline laparotomy open wound, no free fluid or fluid collections within the subcutaneous fat. Severe left hip osteoarthrosis and deformity which may be posttraumatic.  IMPRESSION: Interval partial colectomy with left lower quadrant colostomy, right lower quadrant ileostomy. Slightly decreased fluid distended small bowel may reflect ileus, less likely obstruction.  Increasing intraperitoneal 18 x 29 x 32 mm fluid collection deep to the laparotomy defect concerning for abscess as, this is increased in size from prior imaging, status post interval removal of surgical drains.  Similar micro nodularity and consolidation  in the lung bases.   Electronically Signed   By: Elon Alas   On: 04/05/2014 06:57   Ct Abdomen Pelvis W Contrast  03/30/2014   CLINICAL DATA:  Abdominal pain and nausea. Leukocytosis. Rectal cancer. Bowel ischemia.  EXAM: CT ABDOMEN AND PELVIS WITH CONTRAST  TECHNIQUE: Multidetector CT imaging of the abdomen and pelvis was performed using the standard protocol following bolus administration of intravenous contrast.  CONTRAST:  122m OMNIPAQUE IOHEXOL 300 MG/ML SOLN, 559mOMNIPAQUE IOHEXOL 300 MG/ML SOLN  COMPARISON:  01/09/2014  FINDINGS: Diffuse micro nodularity in the lung bases. Bands of atelectasis in the right middle lobe and both lower lobes.  No compelling evidence of metastatic disease to the liver. The spleen, gallbladder, pancreas, and adrenal glands appear normal. A surgical drain extends from the right upper quadrant down the right paracolic gutter and into the lower pelvis in the vicinity of the prior low anterior resection.  Left transverse colostomy.  Right-sided enterostomy.  Kidneys and proximal ureters unremarkable. No ureteral calculus observed.  Retro aortic left renal vein. Small porta hepatis lymph nodes are present.  Laparotomy wound noted.  Low grade stranding in the omentum.  Distended urinary bladder. Mildly dilated scattered loops of small bowel appear to extend to the right-sided enterostomy site; the small bowel distal to this is of very small caliber.  No definite abscess identified. There is abnormal subcutaneous edema along both hip margins and extending along the anterior upper thigh musculature bilaterally.  Markedly severe arthropathy of the left hip is present with deformity in the acetabulum and left femoral head.  IMPRESSION: 1. Mild small bowel dilatation with air-fluid levels, extending to the right-sided enterostomy. There may be some low grade partial obstruction due to tethering in this vicinity. 2. Diffuse micro nodularity in the lung bases. Given the context,  this probably reflects an atypical infectious process. There is also mild atelectasis in both lung bases. 3. Surgical drain from the right upper quadrant to terminate at the site of the low anterior resection. 4. There is also a left transverse colostomy. The small bowel and colon between the antrostomy and colostomy is decompressed. 5. Low grade inflammatory stranding in the omentum, without abscess or overt ascites. 6. Severe chronic arthropathy of the left hip.   Electronically Signed   By: WaSherryl Barters.D.   On: 03/30/2014 20:35   Dg Chest Port 1 View  03/29/2014   CLINICAL DATA:  [age]ear old male with aspiration pneumonia  EXAM: PORTABLE CHEST - 1 VIEW  COMPARISON:  03/28/2014 and prior chest radiographs dating back to 12/29/2013  FINDINGS: Cardiomediastinal silhouette is unchanged.  Bilateral airspace opacities are again noted without significant change.  There is no evidence of pleural effusion or pneumothorax.  No acute bony abnormalities are identified.  IMPRESSION: Unchanged bilateral airspace opacities from 03/28/2014.   Electronically Signed   By: Hassan Rowan M.D.   On: 03/29/2014 07:10   Dg Chest Port 1 View  03/28/2014   CLINICAL DATA:  PICC line removed, evaluate for any remaining catheter fragment  EXAM: PORTABLE CHEST - 1 VIEW  COMPARISON:  03/27/2014  FINDINGS: Moderate to severe diffuse interstitial change stable. Heart size upper normal and stable. NG tube is been removed. PICC line has been removed. No remaining PICC line fragments identified.  IMPRESSION: No catheter fragments identified.   Electronically Signed   By: Skipper Cliche M.D.   On: 03/28/2014 01:36   Dg Chest Port 1 View  03/27/2014   CLINICAL DATA:  Evaluate infiltrates  EXAM: PORTABLE CHEST - 1 VIEW  COMPARISON:  03/25/2014  FINDINGS: Tracheal extubation. Orogastric tube continues to cross the diaphragm, tip either in the distal stomach or proximal duodenum. Unchanged positioning of left upper extremity PICC.  Borderline  cardiomegaly. Unchanged upper mediastinal contours. Diffuse interstitial coarsening is unchanged. This may represent pneumonitis given previous history of aspiration. No evidence of effusion or pneumothorax.  IMPRESSION: Stable pulmonary inflation after extubation.Unchanged interstitial coarsening, likely pneumonitis related to history of aspiration.   Electronically Signed   By: Jorje Guild M.D.   On: 03/27/2014 06:18   Dg Chest Port 1 View  03/25/2014   CLINICAL DATA:  Followup aspiration.  EXAM: PORTABLE CHEST - 1 VIEW  COMPARISON:  03/24/2014.  FINDINGS: Endotracheal tube ends just below the clavicular heads. Left upper extremity PICC tip at the superior cavoatrial junction. Orogastric tube remains in good position.  Normal heart size. Unchanged upper mediastinal contours. Interstitial coarsening, particularly perihilar, is mildly improved. There is suggestion of mild airspace nodularity in the left chest. No edema, effusion, or pneumothorax.  IMPRESSION: 1. Stable positioning of tubes and central line. 2. Mild improvement in perihilar aeration. Persistent interstitial coarsening correlates with history of aspiration.   Electronically Signed   By: Jorje Guild M.D.   On: 03/25/2014 06:19   Dg Chest Port 1 View  03/24/2014   CLINICAL DATA:  History of colorectal carcinoma, aspiration by history  EXAM: PORTABLE CHEST - 1 VIEW  COMPARISON:  Chest x-ray of 12/29/2013  FINDINGS: There is airspace disease in the perihilar regions bilaterally which may represent pneumonia or edema. Followup chest x-ray is recommended. An endotracheal tube is present with the tip approximately 3.4 cm above the carina. Heart size is stable. NG tube coils in the fundus of the stomach.  IMPRESSION: 1. Bilateral perihilar airspace disease. Possible pneumonia but cannot exclude edema. Recommend followup. 2. Tip of endotracheal tube approximately 3.4 cm above the carina P   Electronically Signed   By: Ivar Drape M.D.   On:  03/24/2014 17:13   Dg Humerus Left  03/28/2014   CLINICAL DATA:  PICC line removed, evaluate for any remaining catheter  EXAM: LEFT HUMERUS - 2+ VIEW  COMPARISON:  None.  FINDINGS: Mild to moderate left shoulder arthritis. Humerus is normal with no fracture, dislocation, or focal osseous abnormality. No catheter fragments identified over the humerus or surrounding soft tissues.  IMPRESSION: No acute findings.  No catheter fragments identified.   Electronically Signed   By: Skipper Cliche M.D.   On: 03/28/2014 01:34    ASSESSMENT:  #1. Stage I rectal cancer, status post excision, no need for  radiation therapy or chemotherapy. #2. Chronic obstructive pulmonary disease with bronchitis. #3. Symptomatic  allergic rhinitis with serous otitis bilaterally. #4. Ischemic colitis, status post colon resection with colostomy in place in addition to ileostomy. #5. Acute and chronic blood loss anemia with reactive thrombocytosis.   PLAN:  #1. Suggest Claritin-D 24 one daily. #2. No further therapy is indicated from the oncologic point of view for his rectal cancer.. #3. Yearly colonoscopy. #4. No further appointments were made in this clinic.   All questions were answered. The patient knows to call the clinic with any problems, questions or concerns. We can certainly see the patient much sooner if necessary.   I spent 30 minutes counseling the patient face to face. The total time spent in the appointment was 40 minutes.    Doroteo Bradford, MD 04/09/2014 9:47 AM  DISCLAIMER:  This note was dictated with voice recognition software.  Similar sounding words can inadvertently be transcribed inaccurately and may not be corrected upon review.

## 2014-04-09 NOTE — Progress Notes (Signed)
LABS DRAWN FOR CBCD,CMP,CEA

## 2014-04-09 NOTE — Patient Instructions (Signed)
[facility] Discharge Instructions  RECOMMENDATIONS MADE BY THE CONSULTANT AND ANY TEST RESULTS WILL BE SENT TO YOUR REFERRING PHYSICIAN.  EXAM FINDINGS BY THE PHYSICIAN TODAY AND SIGNS OR SYMPTOMS TO REPORT TO CLINIC OR PRIMARY PHYSICIAN: Exam and findings as discussed by Dr. Barnet Glasgow.  You have a stage I Rectal Adenocarcinoma and no further treatment is needed.    MD recommends Claritin D 1 daily for otitis. Colonoscopy in 1 year.    INSTRUCTIONS/FOLLOW-UP: No follow-up here is needed.  Thank you for choosing [facility] to provide your oncology and hematology care.  To afford each patient quality time with our providers, please arrive at least 15 minutes before your scheduled appointment time.  With your help, our goal is to use those 15 minutes to complete the necessary work-up to ensure our physicians have the information they need to help with your evaluation and healthcare recommendations.    Effective January 1st, 2014, we ask that you re-schedule your appointment with our physicians should you arrive 10 or more minutes late for your appointment.  We strive to give you quality time with our providers, and arriving late affects you and other patients whose appointments are after yours.    Again, thank you for choosing [facility].  Our hope is that these requests will decrease the amount of time that you wait before being seen by our physicians.       _____________________________________________________________  Should you have questions after your visit to [facility], please contact our office at [phone] between the hours of 8:30 a.m. and 4:30 p.m.  Voicemails left after 4:30 p.m. will not be returned until the following business day.  For prescription refill requests, have your pharmacy contact our office with your prescription refill request.    _______________________________________________________________  We  hope that we have given you very good care.  You may receive a patient satisfaction survey in the mail, please complete it and return it as soon as possible.  We value your feedback!  _______________________________________________________________  Have you asked about our STAR program?  STAR stands for Survivorship Training and Rehabilitation, and this is a nationally recognized cancer care program that focuses on survivorship and rehabilitation.  Cancer and cancer treatments may cause problems, such as, pain, making you feel tired and keeping you from doing the things that you need or want to do. Cancer rehabilitation can help. Our goal is to reduce these troubling effects and help you have the best quality of life possible.  You may receive a survey from a nurse that asks questions about your current state of health.  Based on the survey results, all eligible patients will be referred to the [facility] program for an evaluation so we can better serve you!  A frequently asked questions sheet is available upon request.

## 2014-04-10 LAB — CEA: CEA: 2.6 ng/mL (ref 0.0–5.0)

## 2014-04-13 ENCOUNTER — Encounter (INDEPENDENT_AMBULATORY_CARE_PROVIDER_SITE_OTHER): Payer: Medicaid Other | Admitting: Surgery

## 2014-04-14 ENCOUNTER — Non-Acute Institutional Stay (SKILLED_NURSING_FACILITY): Payer: Medicaid Other | Admitting: Internal Medicine

## 2014-04-14 ENCOUNTER — Encounter: Payer: Self-pay | Admitting: Internal Medicine

## 2014-04-14 DIAGNOSIS — R000 Tachycardia, unspecified: Secondary | ICD-10-CM | POA: Insufficient documentation

## 2014-04-14 DIAGNOSIS — C20 Malignant neoplasm of rectum: Secondary | ICD-10-CM

## 2014-04-14 DIAGNOSIS — H6980 Other specified disorders of Eustachian tube, unspecified ear: Secondary | ICD-10-CM

## 2014-04-14 DIAGNOSIS — D75839 Thrombocytosis, unspecified: Secondary | ICD-10-CM | POA: Insufficient documentation

## 2014-04-14 DIAGNOSIS — D473 Essential (hemorrhagic) thrombocythemia: Secondary | ICD-10-CM

## 2014-04-14 DIAGNOSIS — H6983 Other specified disorders of Eustachian tube, bilateral: Secondary | ICD-10-CM

## 2014-04-14 NOTE — Progress Notes (Signed)
Patient ID: [name], male   DOB: 12/08/1955, 58 y.o.   MRN: [id number]   this is an acute visit.  Level care skilled.  Facility [facility].  Chief complaint-acute visit secondary to tachycardia-ear fullness   HISTORY OF PRESENT ILLNESS: This is a gentleman who presented to Gastroenterology with a history of rectal bleeding. He had a colonoscopy done, which showed a very low rectal cancer.  On this occasion, he presented for surgical treatment of this condition. His preoperative CT scan did not show distant metastasis. Had a coloanal resection and ileostomy on 03/20/2014.  He developed persistent tachycardia postoperatively, and an ileus. There was no evidence of infection.  He went back to the operating room and endoscopy revealed severe ischemia/necrosis of the anastomosis. The anastomosis was resected and converted to a colostomy. Perioperatively, he developed a massive emesis and aspiration requiring intubation and was in the Intensive Care Unit for 48 hours. He extubated, transitioned to the floor, his ileus resolved, his diet advanced.  He had evidence of candida on one blood culture and Pseudomonas in his tracheal aspirate. Infectious Disease consultation. And was placed on antibiotics which he has since completed  Apparently  while in this facility, he became tachycardic. An EKG showed tachycardia. He has been complaining of left lower quadrant pericolostomy pain. He was sent to the emergency room. There, an increase in size, 18 x 29 x 32 mm, intraperitoneal fluid collection immediately deep to the laparotomy defect with surrounding inflammatory changes. This, according to the radiologist, was concerning for an abscess as there was slight increase in size from previous image--.  He did have a surgical consult next day and it was felt this was most likely a seroma that was asymptomatic and did not require aggressive intervention.  H his white count has trended down most recently 13.6 on  August 18-hemoglobin is 8.1 this appears slightly improved over previous hemoglobin of 7.8. His white count was 16.7 when he left the hospital on 04/05/2014, and 21.5 on 04/02/2014.   He also has had thrombocytosis most recent level DCCCXIX on August 18 percent levels have varied from 752-920--he is on aspirin  Chart review it appears he has been tachycardic frequently-I see readings frequently in the 110-120 range--apparently with exertion he goes up and he states that physical therapy today apparently it rose to 150 although unclear exactly how high it was.  When he came back up to the floor he was 1:30-EKG was done which showed sinus tachycardia with a pulse rate of 113.  EKG appears to be fairly comparable with an EKG done previously which also showed sinus tachycardia with pulse rate of 102 noted an EKG done earlier this year for cardiology showed sinus rhythm  He does not complaining of any increased shortness of breath except with exertion he says this is not new does not complain of any chest pain.  He says he has some chronic dizziness question vertigo episodes when he stands-and there is some thought that his anxiety with this when he was standing could have caused significant tachycardia-he says it does make him nervous.  He also is on Claritin for what was possibly thought to be serous otitis-he says his ears feel kind of full  When I examined him his pulse rate was 105 and regular blood pressure was 106/70 he appeared to be resting comfortably in bed  I reevaluated him and pulse rate was around 110-I also checked orthostatic standing blood pressure which was relatively unchanged at 108/74  PAST MEDICAL HISTORY/PROBLEM LIST:  In the 1970s, had a traumatic fracture of his proximal left femur, I believe, leaving him with chronic shortening of his left leg and pain when he is walking. He also walks with a "lurch" in his gait. One of his CT scans of the abdomen and pelvis shows severe  chronic arthropathy of the left hip.  CT scans also suggest diverticulosis without diverticulitis.  CURRENT MEDICATIONS: Medication list is reviewed.  SOCIAL HISTORY:  HOUSING: Lives in [place]. Lives with his wife, who is present.  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  REVIEW OF SYSTEMS:  Gen. no complaints of fever or chills.  Skin does not complaining of diaphoresis.  Eyes does not complaining of visual changes.  Ears does complain of some hearing changes his ears-feel full  CHEST/RESPIRATORY: He is not complaining of cough or shortness of breath--however has shortness of breath with exertion which he says is unchanged.  CARDIAC: No chest pain.  GI: Has a ileostomy as well as a colostomy is not complaining of abdominal discomfort nausea or vomiting.  GU: Urinary difficulties, now with a Foley catheter. Neuro does not complaining of headache does complain of some dizziness when he stands which has been going on for some time and has caused anxiety  PHYSICAL EXAMINATION:  VITAL SIGNS:   Temperature 98.7 pulse 105 respirations 20 blood pressure 106/70 --108/74 standing GENERAL APPEARANCE: Pleasant man in no distress. Lying comfortably in bed.--And when I reevaluated him he was sitting comfortably in his wheelchair  His skin is warm and dry.  Oropharynx clear mucous membranes moist.  Eyes sclera and conjunctiva are clear visual acuity appears grossly intact pupils are reactive to light Ears-tympanic membrane appears pearly gray-  Bilateral--I do not see any significant wax buildup-possibly a small amount of fluid bilaterally   CHEST/RESPIRATORY: Clear air entry bilaterally. No labored breathing  CARDIOVASCULAR:  CARDIAC: Heart sounds are normal except for mild tachycardia. There are no murmurs.  GASTROINTESTINAL:  ABDOMEN: Ileostomy in the right upper quadrant. Left lower quadrant colostomy. Not appreciate any significant tenderness.  Marland Kitchen  BLADDER: Foley is draining cloudy  urine. MSl-did not note any deformities moves all extremities x4 somewhat limited exam since patient is in bed  NEUROLOGICAL:    move all extremities x4 I did not note any lateralizing findings cranial nerves intact speech is clear.  Psych he is alert and oriented x3 pleasant and appropriate smiling  Labs.  04/08/2014.  Sodium 141 potassium 3.7 BUN 7 creatinine 0.59.  04/07/2014.  WBC 13.6 hemoglobin 8.1 platelets 819.  04/06/2014.  WBC 14.0 hemoglobin 7.8 platelets 752.  04/03/2014.  WBC 21.6-hemoglobin 8.5-platelets 920.  BMP within normal limits except bilirubin of 0.2-alkaline phosphatase of 250-albumin of 1.5.  Assessment and plan.  Number -one- tachycardia-apparently this occurs most significantly when he is standing and anxious-EKG showed sinus tachycardia-he does not appear to be symptomatic overtly resting comfortably in bed continues to be mildly tachycardic which appears to be his baseline- Will update lab work including a CBC metabolic panel and TSH tomorrow morning-continue to monitor vital signs closely-he appears to be comfortable-I did discuss this with Dr. Dellia Nims via phone he will evaluate him tomorrow as well  #2- serious otitis-he is on Claritin he says this is not helping a whole lot he still has some dizziness and ear fullness especially when standing-at this point will continue Claritin-1 she would suspect possible vertigo -- again will check lab work including a TSH pending further evaluation-clinically again he  appears to be stable.   #3 Very low rectal cancer with initial resection and coloanal anastomosis which had to be reversed after a significant rocky perioperative course with ileus. Again he has been seen by surgery and was thought questionableabscess essentially a seroma at this point appears to be having less pain continue to monitor .   #4-history of thrombocytosis thought most likely reactive-we'll update a CBC tomorrow--he is on  aspirin  [id number]-of note more than 40 minutes spent assessing patient reassessinghim --reviewing his chart-and coordinating and formulating a plan of care for numerous diagnoses-of note greater than 50% of time spent coordinating plan of care

## 2014-04-15 ENCOUNTER — Other Ambulatory Visit: Payer: Self-pay | Admitting: Internal Medicine

## 2014-04-15 ENCOUNTER — Non-Acute Institutional Stay (SKILLED_NURSING_FACILITY): Payer: Medicaid Other | Admitting: Internal Medicine

## 2014-04-15 DIAGNOSIS — R1310 Dysphagia, unspecified: Secondary | ICD-10-CM

## 2014-04-15 DIAGNOSIS — R000 Tachycardia, unspecified: Secondary | ICD-10-CM

## 2014-04-15 DIAGNOSIS — R945 Abnormal results of liver function studies: Secondary | ICD-10-CM

## 2014-04-15 DIAGNOSIS — R7989 Other specified abnormal findings of blood chemistry: Secondary | ICD-10-CM

## 2014-04-17 ENCOUNTER — Encounter (INDEPENDENT_AMBULATORY_CARE_PROVIDER_SITE_OTHER): Payer: Self-pay

## 2014-04-21 ENCOUNTER — Ambulatory Visit (HOSPITAL_COMMUNITY)
Admission: RE | Admit: 2014-04-21 | Discharge: 2014-04-21 | Disposition: A | Discharge status: Home / Self Care | Payer: Medicaid Other | Source: Ambulatory Visit | Attending: Speech Pathology | Admitting: Speech Pathology

## 2014-04-21 ENCOUNTER — Ambulatory Visit (HOSPITAL_COMMUNITY)
Admission: RE | Admit: 2014-04-21 | Discharge: 2014-04-21 | Disposition: A | Discharge status: Home / Self Care | Payer: Self-pay | Source: Ambulatory Visit | Attending: Internal Medicine | Admitting: Internal Medicine

## 2014-04-21 DIAGNOSIS — K219 Gastro-esophageal reflux disease without esophagitis: Secondary | ICD-10-CM | POA: Insufficient documentation

## 2014-04-21 DIAGNOSIS — R1313 Dysphagia, pharyngeal phase: Secondary | ICD-10-CM | POA: Insufficient documentation

## 2014-04-21 DIAGNOSIS — R1310 Dysphagia, unspecified: Secondary | ICD-10-CM

## 2014-04-21 NOTE — Procedures (Signed)
Objective Swallowing Evaluation: Modified Barium Swallowing Study  Patient Details  Name: [name] MRN: [id number] Date of Birth: 06/20/1956  Today's Date: 04/21/2014 Time:  -     Past Medical History:  Past Medical History  Diagnosis Date  . GERD (gastroesophageal reflux disease)   . Shortness of breath     due to weight gain poststopping smokeing  . Arthritis   . Cancer   . Aspiration pneumonia 03/24/2014  . Atypical chest pain 11/10/2013   Past Surgical History:  Past Surgical History  Procedure Laterality Date  . Colonoscopy N/A 12/22/2013    Procedure: COLONOSCOPY;  Surgeon: Danie Binder, MD;  Location: [facility];  Service: Endoscopy;  Laterality: N/A;  8:30  . Esophagogastroduodenoscopy N/A 12/22/2013    Procedure: ESOPHAGOGASTRODUODENOSCOPY (EGD);  Surgeon: Danie Binder, MD;  Location: [facility];  Service: Endoscopy;  Laterality: N/A;  . Eus N/A 01/01/2014    Procedure: LOWER ENDOSCOPIC ULTRASOUND (EUS);  Surgeon: Milus Banister, MD;  Location: [facility];  Service: Endoscopy;  Laterality: N/A;  . Hip fracture surgery Left age 30 on 04-06-1970  . Eye reattachment and ear reattachment  Right 1971  . Colon resection N/A 03/24/2014    Procedure: Diagnostic  laparotomy, takedown of coloanal anastamosis, end colostomy, with wound vac;  Surgeon: Adin Hector, MD;  Location: [facility];  Service: General;  Laterality: N/A;  . Flexible sigmoidoscopy N/A 03/24/2014    Procedure: FLEXIBLE SIGMOIDOSCOPY;  Surgeon: Adin Hector, MD;  Location: [facility];  Service: General;  Laterality: N/A;   HPI:  [name] is a 58 yo gentleman who presented to Gastroenterology with a history of rectal bleeding. He had a colonoscopy done, which showed a very low rectal cancer. On this occasion, he presented for surgical treatment of this condition. His preoperative CT scan did not show distant metastasis. Had a coloanal resection and ileostomy on 03/20/2014. He developed  persistent tachycardia postoperatively, and an ileus. There was no evidence of infection. He went back to the operating room and endoscopy revealed severe ischemia/necrosis of the anastomosis. The anastomosis was resected and converted to a colostomy. Perioperatively, he developed a massive emesis and aspiration requiring intubation and was in the Intensive Care Unit for 48 hours. He extubated, transitioned to the floor, his ileus resolved, his diet advanced. He had evidence of candida on one blood culture and Pseudomonas in his tracheal aspirate. Infectious Disease consultation. And was placed on antibiotics which he has since completed. MBSS requested by treating SLP and MD to rule out aspiration with thin liquids. Pt has been consuming thin liquids with a chin tuck.     Recommendation/Prognosis  Clinical Impression:   Dysphagia Diagnosis: Mild pharyngeal phase dysphagia Clinical impression: Pt presents with mild pharyngeal phase dysphagia characterized by mild delay in swallow initiation with puree, triggering after filling the valleculae, min decrease tongue base retraction and hyolaryngeal excursion resulting in min/trace residuals in valleculae and pyriforms which cleared with repeat/spontaneous swallow. Pt demonstrated mild premature spillage to the valleculae with thin liquids. No penetration or aspiration observed. Recommend regular textures with thin liquids; pt to swallow 2x for each bite/sip.   Swallow Evaluation Recommendations:  Diet Recommendations: Regular;Thin liquid Liquid Administration via: Cup;Straw Medication Administration: Whole meds with liquid Supervision: Patient able to self feed Compensations: Multiple dry swallows after each bite/sip Postural Changes and/or Swallow Maneuvers: Out of bed for meals;Seated upright 90 degrees;Upright 30-60 min after meal Oral Care Recommendations: Oral care BID Other  Recommendations: Clarify dietary restrictions Follow up Recommendations:  Skilled Nursing facility    Prognosis:  Prognosis for Safe Diet Advancement: Good   Individuals Consulted: Consulted and Agree with Results and Recommendations: Patient;Other (Comment) (treating SLP) Report Sent to : Referring physician;Primary SLP      SLP Assessment/Plan General: Date of Onset: 03/19/14 Type of Study: Modified Barium Swallowing Study Reason for Referral: Objectively evaluate swallowing function Previous Swallow Assessment: None on record; evidence of aspiration of emesis while in acute setting Diet Prior to this Study: Regular;Thin liquids Temperature Spikes Noted: No Respiratory Status: Room air History of Recent Intubation: No (In July) Behavior/Cognition: Alert;Cooperative;Pleasant mood Oral Cavity - Dentition: Adequate natural dentition (missing molars) Oral Motor / Sensory Function: Within functional limits Self-Feeding Abilities: Able to feed self Patient Positioning: Upright in chair Baseline Vocal Quality: Clear;Breathy (mild breathy quality) Volitional Cough: Strong Volitional Swallow: Able to elicit Anatomy: Within functional limits Pharyngeal Secretions: Not observed secondary MBS   Reason for Referral:   Objectively evaluate swallowing function    Oral Phase: Oral Preparation/Oral Phase Oral Phase: WFL   Pharyngeal Phase:  Pharyngeal Phase Pharyngeal Phase: Impaired Pharyngeal - Thin Pharyngeal - Thin Cup: Premature spillage to valleculae;Reduced tongue base retraction;Reduced laryngeal elevation;Pharyngeal residue - valleculae;Pharyngeal residue - pyriform sinuses (trace residuals) Pharyngeal - Thin Straw: Premature spillage to valleculae;Reduced laryngeal elevation;Reduced tongue base retraction;Pharyngeal residue - valleculae;Pharyngeal residue - pyriform sinuses (trace residuals) Pharyngeal - Solids Pharyngeal - Puree: Delayed swallow initiation;Premature spillage to valleculae;Reduced tongue base retraction;Pharyngeal residue -  valleculae Pharyngeal - Regular: Delayed swallow initiation;Reduced tongue base retraction;Pharyngeal residue - valleculae Pharyngeal - Pill: Within functional limits   Cervical Esophageal Phase  Cervical Esophageal Phase Cervical Esophageal Phase: [facility]   Thank you,  Genene Churn, [place]        [place] 04/21/2014, 2:31 PM

## 2014-04-22 NOTE — Progress Notes (Addendum)
Patient ID: [name], male   DOB: Jul 14, 1956, 58 y.o.   MRN: [id number]               PROGRESS NOTE  DATE:  04/15/2014    FACILITY: [facility]    LEVEL OF CARE:   SNF   Acute Visit   CHIEF COMPLAINT:  Tachycardia.     HISTORY OF PRESENT ILLNESS:  [name] is a 58 year-old man who came to [place] after undergoing a resection of a rectal cancer.  He required an ileostomy.  Subsequently, he required to go back to the OR and have the anastomosis reconverted to a colostomy secondary to ischemia and necrosis of the anastomosis.  He came here and there was some concern for an abscess in the left lower quadrant, although he had seen Surgery and they expressed themselves satisfied with this.  Indeed, his pain seems a lot better.    While in the hospital and on different occasions here, he seems to have developed tachycardia.  The patient states he feels very anxious at these times.  In therapy, his pulse rate has gotten up to 150, apparently.    EKG done yesterday shows sinus tachycardia.  There is poor R-wave progression across V1 through V4, raising the question of an old anterior MI.  Nevertheless, this is not a new finding.  When he had this in the hospital, the EKG that was done was as I quoted above.    LABORATORY DATA:   Lab work from today shows a white count of 10, a hemoglobin of 10, and a platelet count of 583 with a normal differential.  All of this is improved.    His comprehensive metabolic panel shows an albumin of 2, a calcium of 9.3.  His alk phos is 392, AST is 83, ALT of 144.  These were also present in the hospital at which time his alk phos was 268, 343.  On 04/09/2014, his AST and ALT, however, at the time were normal.    REVIEW OF SYSTEMS:   CHEST/RESPIRATORY:  He is not complaining of shortness of breath.   CARDIAC:   I am not completely certain whether he is aware of these palpitations or not.  He does describe himself as being very anxious at these  times.   GI:  He is not complaining of lower abdominal pain, nausea or vomiting.    PHYSICAL EXAMINATION:   GENERAL APPEARANCE:  The patient does not look to be in any distress.   CHEST/RESPIRATORY:  Clear air entry bilaterally.   CARDIOVASCULAR:  CARDIAC:   Heart sounds are normal.  There are no murmurs or gallops.   GASTROINTESTINAL:  LIVER/SPLEEN/KIDNEYS:  No liver, no spleen.  No tenderness.  He has a functional ileostomy and colostomy.  The left lower quadrant tenderness is considerably better.    ASSESSMENT/PLAN:  Sinus tachycardia.  I am not completely certain about whether the patient is symptomatic from this or not.  His EKGs consistently show ?anterior septal old myocardial infarction.  His blood pressures seem to tolerate this well.  Of course, he is not doing well in physical therapy now.  I will probably put him on a small dose of Lopressor at 12.5 b.i.d.    Elevated liver function tests.  Previous imaging studies via CT scan of the liver, gallbladder, and pancreas were unremarkable.  He is asymptomatic.  I will repeat this probably next week and make additional suggestions, possibly repeat of imaging studies.

## 2014-04-29 ENCOUNTER — Encounter: Payer: Self-pay | Admitting: *Deleted

## 2014-04-29 ENCOUNTER — Non-Acute Institutional Stay (SKILLED_NURSING_FACILITY): Payer: Medicaid Other | Admitting: Internal Medicine

## 2014-04-29 DIAGNOSIS — R945 Abnormal results of liver function studies: Secondary | ICD-10-CM

## 2014-04-29 DIAGNOSIS — R7989 Other specified abnormal findings of blood chemistry: Secondary | ICD-10-CM

## 2014-04-29 DIAGNOSIS — K649 Unspecified hemorrhoids: Secondary | ICD-10-CM

## 2014-04-29 DIAGNOSIS — R000 Tachycardia, unspecified: Secondary | ICD-10-CM

## 2014-04-29 NOTE — Progress Notes (Signed)
Patient ID: [name], male   DOB: 26-Jun-1956, 58 y.o.   MRN: [id number]   this is an acute visit Level of care skilled.  Facility [facility]     CHIEF COMPLAINT: Acute visit secondary to rectal pain-followup tachycardia-   HISTORY OF PRESENT ILLNESS: [name] is a 58 year-old man who came to [place] after undergoing a resection of a rectal cancer. He required an ileostomy. Subsequently, he required to go back to the OR and have the anastomosis reconverted to a colostomy secondary to ischemia and necrosis of the anastomosis. He came here and there was some concern for an abscess in the left lower quadrant, although he had seen Surgery and they expressed themselves satisfied with this. Indeed, his pain seems a lot better A recently complained of increased rectal pain-he states he does have a history of hemorrhoids I am following up on this.   While in the hospital and on different occasions here, he seems to have developed tachycardia.  .  EKG showed sinus tachycardia. There is poor R-wave progression across V1 through V4, raising the question of an old anterior MI. , this is not a new finding.  He has been started on low-dose Lopressor his pulse is in the 80s today occasionally he will have one slightly over 100 but it appears his baseline is under 100 which is an improvement-blood pressures appear to be stable 127/84-124/57-142/69 .  Family medical social history reviewed per admission note on 04/06/2014.  Medications have been reviewed per MAR .  REVIEW OF SYSTEMS: No does not complaining of fever chills   CHEST/RESPIRATORY: He is not complaining of shortness of breath.  CARDIAC does not complaining of chest pain or palpitation   GI: He is not complaining of lower abdominal pain, nausea or vomiting. Rectal-does complain of pain especially when he is lying flat on his back  Neurologic does not complaining of dizziness or syncopal type episodes  PHYSICAL EXAMINATION: T-97.5 pulse 82  respirations 16 blood pressure 124/57 O2 saturation 97% In general this is a very pleasant middle-age male in no distress .  CHEST/RESPIRATORY: Clear air entry bilaterally.  CARDIOVASCULAR:  CARDIAC: Heart sounds are normal. There are no murmurs or gallops rate is in the 80s.  GASTROINTESTINAL:  LIVER/SPLEEN/KIDNEYS:. . He has a functional ileostomy and colostomy. Does not really have tenderness to the abdomen--bowel sounds are positive.  Rectal-he does appear to have an oversew-I do note some external hemorrhoids these are quite tender to palpation  Muscle skeletal-he is now ambulating with a walker appears to have gained strength does not really complain of joint pain.  Labs.  04/20/2014.  WBC 11.5-hemoglobin 9.9-platelets 618.  Sodium 139 potassium 4.5 BUN 14 creatinine 0.73.  Bilirubin 0.2-alkaline phosphatase 263-ALT 99 albumin 2.3.  Assessment plan.  #1-rectal pain I suspect this is most likely due to the hemorrhoids-Will order Anusol cream when necessary and monitor-.  #2-tachycardia-this appears to be under better control on theLopressor a note occasionally readings slightly above 100 but these have been greatly reduced since Lopressor was started-he does not complaining of any syncope palpitations  #3-history of elevated liver function tests-per chart review these appear to be trending down he does not really appear symptomatic of any abdominal discomfort although apparently this was an issue earlier in his stay.  History of mildly elevated white count again he does not appear symptomatic of any infection well update lab work including a CBC as well as metabolic panel-will obtain liver function tests as well  [id number] .  ASSESSMENT/PLAN:  Sinus tachycardia. I am not completely certain about whether the patient is symptomatic from this or not. His EKGs consistently show ?anterior septal old myocardial infarction. His blood pressures seem to tolerate this well. Of course,  he is not doing well in physical therapy now. I will probably put him on a small dose of Lopressor at 12.5 b.i.d.  Elevated liver function tests. Previous imaging studies via CT scan of the liver, gallbladder, and pancreas were unremarkable. He is asymptomatic. I will repeat this probably next week and make additional suggestions, possibly repeat of imaging studies.

## 2014-04-30 ENCOUNTER — Encounter (INDEPENDENT_AMBULATORY_CARE_PROVIDER_SITE_OTHER): Payer: Self-pay | Admitting: Surgery

## 2014-05-01 ENCOUNTER — Non-Acute Institutional Stay (SKILLED_NURSING_FACILITY): Payer: Medicaid Other | Admitting: Internal Medicine

## 2014-05-01 ENCOUNTER — Encounter: Payer: Self-pay | Admitting: Internal Medicine

## 2014-05-01 DIAGNOSIS — C20 Malignant neoplasm of rectum: Secondary | ICD-10-CM

## 2014-05-01 DIAGNOSIS — R338 Other retention of urine: Secondary | ICD-10-CM

## 2014-05-01 DIAGNOSIS — K219 Gastro-esophageal reflux disease without esophagitis: Secondary | ICD-10-CM

## 2014-05-01 DIAGNOSIS — R000 Tachycardia, unspecified: Secondary | ICD-10-CM

## 2014-05-01 NOTE — Progress Notes (Signed)
Patient ID: [name], male   DOB: [birth year], 58 y.o.   MRN: [id number]   This is a discharge note.  Level of care skilled.  Facility [facility].    CHIEF COMPLAINT: Discharge note .  HISTORY OF PRESENT ILLNESS: This is a gentleman who presented to  Gastroenterology with a history of rectal bleeding. He had a colonoscopy done, which showed a very low rectal cancer.  On this occasion, he presented for surgical treatment of this condition. His preoperative CT scan did not show distant metastasis. He presented on this occasion,  for a coloanal resection and ileostomy on 03/20/2014.  He developed persistent tachycardia postoperatively, and an ileus. There was no evidence of infection.  He went back to the operating room and endoscopy revealed severe ischemia/necrosis of the anastomosis. The anastomosis was resected and converted to a colostomy. Perioperatively, he developed a massive emesis and aspiration requiring intubation and was in the Intensive Care Unit for 48 hours. He extubated, transitioned to the floor, his ileus resolved, his diet advanced.  He had evidence of candida on one blood culture and Pseudomonas in his tracheal aspirate. Infectious Disease consultation There was concern for left lower quadrant abscess with surgery follow up on this and was thought not to be the case  He has gained strength and will be going home with his spouse ----d he has a history of tachycardia and he was started on low-dose Lopressor which appears to be help-this tachycardia is not really symptomatic.  I did see him recently for hemorrhoid pain and he is receiving topical treatment for this.  Patient will have follow up with GI-also a Foley catheter is to be DC'd tomorrow with a trial course before discharge to make sure he is voiding.       PAST MEDICAL HISTORY/PROBLEM LIST:  In the 1970s, had a traumatic fracture of his proximal left femur, I believe, leaving him with chronic shortening of his  left leg and pain when he is walking. He also walks with a "lurch" in his gait. One of his CT scans of the abdomen and pelvis shows severe chronic arthropathy of the left hip.  CT scans also suggest diverticulosis without diverticulitis.  CURRENT MEDICATIONS: Medication list is reviewed.per [facility]   SOCIAL HISTORY:  HOUSING: Lives in [place].   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   REVIEW OF SYSTEMS Gen. no complaints of fever chills:  CHEST/RESPIRATORY: He is not complaining of cough or shortness of breath.  CARDIAC: No chest pain.  GI: Is not complaining of abdominal pain --does complain of gas-type pain at times.  GU: Urinary difficulties, now with a Foley catheter. He does not have known prostate issues. Muscle skeletal-does not complaining of joint pain currently Neurologic-did not complain of headache or dizziness or syncopal type episodes.  Psych appears to be in good spirits does not complaining of depression or anxiety   PHYSICAL EXAMINATION:   Temperature 98.3-pulse 91-respirations 20-blood pressure 117/86  r.  GENERAL APPEARANCE: Pleasant man in no distress His skin is warm and dry.  CHEST/RESPIRATORY: Clear air entry bilaterally.  CARDIOVASCULAR:  CARDIAC: Heart sounds are normal. There are no murmurs.  GASTROINTESTINAL:  ABDOMEN: Ileostomy in the right upper quadrant. Left lower quadrant colostomy. Not appear to have significant tenderness  GENITOURINARY:--Foley catheter in place  Rectal: He has,, an anal over-sew.   Muscle skeletal-moves all extremities x4 is ambulating with a rolling walker appears to be doing well with this.    NEUROLOGICAL:   I  do not see any lateralizing findings-cranial nerves are intact speech is clear-.  Psych-he is alert and oriented x3 pleasant and appropriate.  Labs.  04/30/2014.  WBC 9.9-hemoglobin 9.8-platelets 440.  Sodium 139-potassium 4.4-BUN C&S creatinine 0.73.  Alkaline phosphatase 141-albumin 2.2-bilirubin  0.1-otherwise liver function tests within normal limits    ASSESSMENT/PLAN:  Very low rectal cancer with initial resection and coloanal anastomosis which had to be reversed after a significant rocky perioperative course with ileus--despite this he appears to have made good progress during rehabilitation-will have followup by GI-and will need home health support for care of this at home-I did review his oncology note which states no further appointments at this point are necessary .  Pericolostomy abscess.--At this point does not appear to be an issue surgery has evaluated this.  #3 some history of elevated liver function tests these have significantly improved her recent lab.  #4-history urinary retention again Foley catheter will be removed tomorrow.  #5  history tachycardia this appears improved with initiation of low-dose Lopressor  #6-history of GERD he is on a proton pump inhibitor.  #7-history of suspected allergic rhinitis this appears to be improved he is on Claritin.  #8-history of hemorrhoid discomfort again this appears to be improved he is receiving Anusolas needed  Again patient will be going home with his wife who is very supportive will need PT and OT for further strengthening secondary to debility with his extended hospitalization-also will need nursing support for care of his abdominal issues including history of ostomy-with abdominal  wound.  [id number] note greater than 30 minutes spent on this discharge summary

## 2014-05-03 ENCOUNTER — Encounter: Payer: Self-pay | Admitting: Internal Medicine

## 2014-05-03 DIAGNOSIS — K649 Unspecified hemorrhoids: Secondary | ICD-10-CM | POA: Insufficient documentation

## 2014-05-04 ENCOUNTER — Encounter (HOSPITAL_COMMUNITY): Payer: Self-pay | Admitting: Speech Pathology

## 2014-05-07 ENCOUNTER — Ambulatory Visit (INDEPENDENT_AMBULATORY_CARE_PROVIDER_SITE_OTHER): Payer: Medicaid Other | Admitting: Otolaryngology

## 2014-05-07 ENCOUNTER — Encounter (INDEPENDENT_AMBULATORY_CARE_PROVIDER_SITE_OTHER): Payer: Self-pay | Admitting: Surgery

## 2014-05-07 DIAGNOSIS — H6980 Other specified disorders of Eustachian tube, unspecified ear: Secondary | ICD-10-CM

## 2014-05-11 ENCOUNTER — Ambulatory Visit: Payer: Self-pay | Admitting: Family Medicine

## 2014-05-12 ENCOUNTER — Ambulatory Visit (INDEPENDENT_AMBULATORY_CARE_PROVIDER_SITE_OTHER): Payer: Medicaid Other | Admitting: Family Medicine

## 2014-05-12 ENCOUNTER — Encounter: Payer: Self-pay | Admitting: Family Medicine

## 2014-05-12 VITALS — BP 140/84 | Ht 64.0 in | Wt 164.0 lb

## 2014-05-12 DIAGNOSIS — I10 Essential (primary) hypertension: Secondary | ICD-10-CM

## 2014-05-12 DIAGNOSIS — R000 Tachycardia, unspecified: Secondary | ICD-10-CM

## 2014-05-12 DIAGNOSIS — Z23 Encounter for immunization: Secondary | ICD-10-CM

## 2014-05-12 DIAGNOSIS — K219 Gastro-esophageal reflux disease without esophagitis: Secondary | ICD-10-CM

## 2014-05-12 DIAGNOSIS — R338 Other retention of urine: Secondary | ICD-10-CM

## 2014-05-12 LAB — POCT URINALYSIS DIPSTICK
Spec Grav, UA: 1.02
pH, UA: 5

## 2014-05-12 MED ORDER — METOPROLOL TARTRATE 25 MG PO TABS: 12.5000 mg | ORAL_TABLET | Freq: Two times a day (BID) | ORAL | Status: DC

## 2014-05-12 NOTE — Patient Instructions (Signed)
Start twicedaily one glass of carnation instant breakfast  Due to your anemia, please take one iron gluconate tablet each day and one multivitamin each day  Please walk daily

## 2014-05-12 NOTE — Progress Notes (Signed)
   Subjective:    Patient ID: [name], male    DOB: 01/10/1956, 58 y.o.   MRN: [id number]  HPI  Patient is here today after follow up visit from hospital. Patient has ileostomy  & colostomy pouches placed. Patient just was discharged from the [facility] 05/05/2014. Patient has no concerns other than when can he go back to driving.      Results for orders placed in visit on 04/09/14  CBC WITH DIFFERENTIAL      Result Value Ref Range   WBC 11.9 (*) 4.0 - 10.5 K/uL   RBC 3.07 (*) 4.22 - 5.81 MIL/uL   Hemoglobin 9.1 (*) 13.0 - 17.0 g/dL   HCT 28.9 (*) 39.0 - 52.0 %   MCV 94.1  78.0 - 100.0 fL   MCH 29.6  26.0 - 34.0 pg   MCHC 31.5  30.0 - 36.0 g/dL   RDW 15.4  11.5 - 15.5 %   Platelets 702 (*) 150 - 400 K/uL   Neutrophils Relative % 80 (*) 43 - 77 %   Neutro Abs 9.4 (*) 1.7 - 7.7 K/uL   Lymphocytes Relative 10 (*) 12 - 46 %   Lymphs Abs 1.2  0.7 - 4.0 K/uL   Monocytes Relative 8  3 - 12 %   Monocytes Absolute 1.0  0.1 - 1.0 K/uL   Eosinophils Relative 2  0 - 5 %   Eosinophils Absolute 0.3  0.0 - 0.7 K/uL   Basophils Relative 0  0 - 1 %   Basophils Absolute 0.0  0.0 - 0.1 K/uL  COMPREHENSIVE METABOLIC PANEL      Result Value Ref Range   Sodium 136 (*) 137 - 147 mEq/L   Potassium 4.6  3.7 - 5.3 mEq/L   Chloride 98  96 - 112 mEq/L   CO2 24  19 - 32 mEq/L   Glucose, Bld 161 (*) 70 - 99 mg/dL   BUN 12  6 - 23 mg/dL   Creatinine, Ser 0.74  0.50 - 1.35 mg/dL   Calcium 8.7  8.4 - 10.5 mg/dL   Total Protein 7.5  6.0 - 8.3 g/dL   Albumin 1.9 (*) 3.5 - 5.2 g/dL   AST 43 (*) 0 - 37 U/L   ALT 88 (*) 0 - 53 U/L   Alkaline Phosphatase 343 (*) 39 - 117 U/L   Total Bilirubin 0.3  0.3 - 1.2 mg/dL   GFR calc non Af Amer >90  >90 mL/min   GFR calc Af Amer >90  >90 mL/min   Anion gap 14  5 - 15  CEA      Result Value Ref Range   CEA 2.6  0.0 - 5.0 ng/mL   Patient said that after he stops urinating he has a burning, tingling sensation since foley catheter was  removed.       Review of Systems     Objective:   Physical Exam  Alert no apparent distress vitals stable. Lungs clear heart regular in rhythm. Abdomen double enterostomy present bowel sounds intact ankles without edema oriented  Urinalysis normal    Assessment & Plan:  Impression 1 status post rectal cancer surgery with life-threatening complication now discharged nursing home #2 nutrition concerns albumin low 1.7 discussed. #3 left hip remote fracture deformity facing surgery this were plan pneumonia shot. Flu shot. Daily and sure. Rationale discussed followup in a couple months. WSL

## 2014-05-13 ENCOUNTER — Ambulatory Visit (INDEPENDENT_AMBULATORY_CARE_PROVIDER_SITE_OTHER): Payer: Medicaid Other | Admitting: Gastroenterology

## 2014-05-13 ENCOUNTER — Encounter: Payer: Self-pay | Admitting: Gastroenterology

## 2014-05-13 VITALS — BP 127/84 | HR 93 | Temp 97.7°F | Ht 64.0 in | Wt 164.8 lb

## 2014-05-13 DIAGNOSIS — C20 Malignant neoplasm of rectum: Secondary | ICD-10-CM

## 2014-05-13 NOTE — Progress Notes (Signed)
NIC'D FOR 12/2014

## 2014-05-13 NOTE — Progress Notes (Signed)
Subjective:    Patient ID: [name], male    DOB: 22-Oct-1955, 58 y.o.   MRN: [id number]  Rubbie Battiest, MD  HPI STILL HAVING PROBLEMS WITH URINATION LAST NIGHT. UA PENDING. NO PROBLEMS EMPTYING THE BLADDER. BOWEL DOING GOOD. NAUSEA AT NIGHT. ONLY VOMITED ONE TIME. CHANGING BAG EVERY 3-4 DAYS. EMPTIES: 2-3X/DAY(SOFT). STILL HAS SOB. DOESN'T NEED CHEMO. CANCER FREE. STILL RAISING ANIMALS.  PT DENIES FEVER, CHILLS, HEMATOCHEZIA, HEMATEMESIS, melena, diarrhea, CHEST PAIN, CHANGE IN BOWEL IN HABITS, constipation, abdominal pain, OR heartburn or indigestion.   Past Medical History  Diagnosis Date  . GERD (gastroesophageal reflux disease)   . Shortness of breath     due to weight gain poststopping smokeing  . Arthritis   . Cancer   . Aspiration pneumonia 03/24/2014  . Atypical chest pain 11/10/2013   Past Surgical History  Procedure Laterality Date  . Colonoscopy N/A 12/22/2013    Procedure: COLONOSCOPY;  Surgeon: Danie Binder, MD;  Location: [facility];  Service: Endoscopy;  Laterality: N/A;  8:30  . Esophagogastroduodenoscopy N/A 12/22/2013    Procedure: ESOPHAGOGASTRODUODENOSCOPY (EGD);  Surgeon: Danie Binder, MD;  Location: [facility];  Service: Endoscopy;  Laterality: N/A;  . Eus N/A 01/01/2014    Procedure: LOWER ENDOSCOPIC ULTRASOUND (EUS);  Surgeon: Milus Banister, MD;  Location: [facility];  Service: Endoscopy;  Laterality: N/A;  . Hip fracture surgery Left age 36 on 04-06-1970  . Eye reattachment and ear reattachment  Right 1971  . Colon resection N/A 03/24/2014    Procedure: Diagnostic  laparotomy, takedown of coloanal anastamosis, end colostomy, with wound vac;  Surgeon: Adin Hector, MD;  Location: [facility];  Service: General;  Laterality: N/A;  . Flexible sigmoidoscopy N/A 03/24/2014    Procedure: FLEXIBLE SIGMOIDOSCOPY;  Surgeon: Adin Hector, MD;  Location: [facility];  Service: General;  Laterality: N/A;   No Known Allergies  Current Outpatient Prescriptions    Medication Sig Dispense Refill  . acetaminophen (TYLENOL) 325 MG tablet Take 650 mg by mouth every 6 (six) hours as needed. Take 2 Tablets      . Amino Acids-Protein Hydrolys (FEEDING SUPPLEMENT, PRO-STAT SUGAR FREE 64,) LIQD Take 30 mLs by mouth 2 (two) times daily at 8 am and 10 pm.    . aspirin EC 81 MG tablet Take 1 tablet (81 mg total) by mouth daily.    Regino Schultze Bandages & Supports (JOBST FOR MEN 15-20MMHG MED) MISC 1 Package by Does not apply route daily.    Marland Kitchen loratadine (CLARITIN) 10 MG tablet Take 1 tablet (10 mg total) by mouth daily.    . metoprolol tartrate (LOPRESSOR) 25 MG tablet Take 0.5 tablets (12.5 mg total) by mouth 2 (two) times daily.    . Multiple Vitamins-Minerals (ONE DAILY FOR MEN 50+ ADVANCED PO) Take 1 tablet by mouth daily.    . Omega-3 Fatty Acids (FISH OIL) 1000 MG CAPS Take 1,000 mg by mouth daily.    Marland Kitchen omeprazole (PRILOSEC) 20 MG capsule Take 20 mg by mouth 2 (two) times daily.    . ondansetron (ZOFRAN) 4 MG tablet Take 1 tablet (4 mg total) by mouth every 8 (eight) hours as needed for nausea.    Marland Kitchen oxyCODONE (OXY IR/ROXICODONE) 5 MG immediate release tablet Take one or two tablets by mouth every 4 hours as needed for moderate, severe or breakthrough pain    . saw palmetto 160 MG capsule Take 160 mg by mouth daily.    Marland Kitchen  tamsulosin (FLOMAX) 0.4 MG CAPS capsule Take 1 capsule (0.4 mg total) by mouth daily.    . vitamin C (ASCORBIC ACID) 500 MG tablet Take 500 mg by mouth 2 (two) times daily.    Roseanne Kaufman Peru-Castor Oil (VENELEX) OINT Apply 1 application topically daily.       Review of Systems     Objective:   Physical Exam  Vitals reviewed. Constitutional: He is oriented to person, place, and time. He appears well-developed. No distress.  HENT:  Head: Normocephalic and atraumatic.  Mouth/Throat: Oropharynx is clear and moist. No oropharyngeal exudate.  Eyes: Pupils are equal, round, and reactive to light. No scleral icterus.  Neck: Normal range of motion.  Neck supple.  Cardiovascular: Normal rate, regular rhythm and normal heart sounds.   Pulmonary/Chest: Effort normal and breath sounds normal. No respiratory distress.  Abdominal: Soft. Bowel sounds are normal. He exhibits no distension. There is no tenderness.  MIDLINE INCISION COVERED WITH DRY DRESSING. OSTOMY IN BLQs. NO ERYTHEMA.  Musculoskeletal: He exhibits no edema.  Lymphadenopathy:    He has no cervical adenopathy.  Neurological: He is alert and oriented to person, place, and time.  NO FOCAL DEFICITS   Psychiatric:  FLAT AFFECT, TEARFUL MOOD           Assessment & Plan:

## 2014-05-13 NOTE — Progress Notes (Signed)
cc'ed to pcp °

## 2014-05-13 NOTE — Assessment & Plan Note (Signed)
COURSE COMPLICATED BY BREAKDOWN OF ANASTOMOSIS. CLINICALLY IMPROVED. EMOTIONALLY STILL DEALING WITH COMPLICATIONS AND OSTOMY.  ENCOURAGED PT TO FOLLOW UP WITH DR. Johney Maine. [facility] MAY 2016 TO DISCUSS REPEAT TCS

## 2014-05-13 NOTE — Patient Instructions (Signed)
[name] YOU HAVE DONE WELL CONSIDERING YOU HAVE BEEN THROUGH A LOT.  THIS DIDN'T COME TO STAY IT CAME TO PASS. YOU ARE AN OVERCOMER. YOU ARE CANCER FREE. DON'T FORGET THE BATTLES YOU HAVE ALREADY WON.  I WILL SEE YOU IN MAY 2016.

## 2014-05-18 ENCOUNTER — Telehealth: Payer: Self-pay | Admitting: Family Medicine

## 2014-05-18 DIAGNOSIS — Z4801 Encounter for change or removal of surgical wound dressing: Secondary | ICD-10-CM

## 2014-05-18 DIAGNOSIS — Z483 Aftercare following surgery for neoplasm: Secondary | ICD-10-CM

## 2014-05-18 DIAGNOSIS — Z433 Encounter for attention to colostomy: Secondary | ICD-10-CM

## 2014-05-18 DIAGNOSIS — K219 Gastro-esophageal reflux disease without esophagitis: Secondary | ICD-10-CM

## 2014-05-18 MED ORDER — LORAZEPAM 0.5 MG PO TABS: ORAL_TABLET | ORAL | Status: DC

## 2014-05-18 NOTE — Telephone Encounter (Signed)
Pt wanting [place] to take over the script he received while in the [facility] He is unsure to spoke to you about the med when he was at his OV last week Pt has 3 left   Lorazepam 0.5 mg tab  Take 1/2 tab .25 mg PO BID PRN for Anxiety *Half Tabs*  [name]

## 2014-05-18 NOTE — Telephone Encounter (Signed)
Notified patient medication will be faxed to pharmacy by the end of the day. Patient verbalized understanding.

## 2014-05-18 NOTE — Telephone Encounter (Signed)
Ok thirty d worth plus two ref

## 2014-05-18 NOTE — Telephone Encounter (Signed)
Pt also checking on his Urine results

## 2014-06-16 ENCOUNTER — Other Ambulatory Visit (INDEPENDENT_AMBULATORY_CARE_PROVIDER_SITE_OTHER): Payer: Self-pay | Admitting: Surgery

## 2014-06-16 ENCOUNTER — Telehealth: Payer: Self-pay | Admitting: Family Medicine

## 2014-06-16 NOTE — Telephone Encounter (Signed)
error 

## 2014-06-16 NOTE — H&P (Signed)
Nabeel L. Takeda 06/15/2014 3:56 PM Location: [facility] Patient #: [id number] DOB: Sep 07, 1955 Married / Language: Cleophus Molt / Race: White Male History of Present Illness Adin Hector MD; 06/16/2014 4:02 PM) Patient words: Post Op f/u.  The patient is a 58 year old male presenting for a post-operative visit. Patient comes in today feeling better. Had very low rectal cancer. Underwent minimally invasive resection. Complicated by necrosis of colon requiring resection and permanent colostomy. Still has diverting loop ileostomy. Start with depression. Has left lower leg deformity with limited mobility. He has gradually improved and that. No longer in a wheelchair. Walking with a cane. Working on the farm some. Working to get disability. He is eating better. He comes today with his wife. No fevers or chills. Gaining weight. Empties ileostomy bag 4 times a day. No antidiarrheals. Some skin irritation. Has not seen an outpatient ostomy nurse. Has occasionally called a [employer] supply wrap. Still has information from our office as well. Ostomy care at rehabilitation/nursing center was marginal. He made a worker apron to help hold the ostomy bags. That helps. His depression is less. He is feeling more optimistic. He is happy that the cancer is gone. Come to terms with the last May. Does not want to go anywhere else for second opinion on takedown. He does note he has some difficulty with erections. No problems with urinary incontinence. Patient Care Team: [name], MD as PCP - General (Family Medicine) Satira Sark, MD as Consulting Physician (Cardiology) Danie Binder, MD as Consulting Physician (Gastroenterology) Milus Banister, MD as Attending Physician (Gastroenterology) Farrel Gobble, MD as Consulting Physician (Medical Oncology) Adin Hector, MD as Consulting Physician (General Surgery) Procedure (Date: 7/31/202015): POST-OPERATIVE DIAGNOSIS:  VERY LOW RECTAL CANCER PROCEDURE: ROBOTIC ASSISTED low anterior resection TRANSANAL TRANSABDOMINAL TME WITH HANDSEWN COLOANAL ANASTOMOSIS splenic flexure mobilization diverting loop ileostomy Surgeon(s): Adin Hector, MD Leighton Ruff, MD - Asst Procedure (Date 03/24/2014): POST-OPERATIVE DIAGNOSIS Massive ileus Ischemia/necrosis of distal colon at coloanal anastomosis PROCEDURE: FLEXIBLE SIGMOIDOSCOPY Exploratory laparotomy Takedown of coloanal anastomosis Distal colectomy & end colostomy Wound vac SURGEON: Surgeon(s): Adin Hector, MD ASSISTANT: Servando Snare, RNFA Other Problems Briant Cedar, [place]; 06/15/2014 3:56 PM) Gastroesophageal Reflux Disease High blood pressure  Past Surgical History Briant Cedar, [place]; 06/15/2014 3:56 PM) Colon Polyp Removal - Colonoscopy Colon Removal - Partial  Diagnostic Studies History Briant Cedar, [place]; 06/15/2014 3:56 PM) Colonoscopy within last year  Allergies Briant Cedar, [place]; 06/15/2014 3:57 PM) No Known Drug Allergies 06/15/2014  Medication History Briant Cedar, CMA; 06/15/2014 4:02 PM) LORazepam (0.5MG  Tablet, Oral) Active. Metoprolol Tartrate (25MG  Tablet, Oral) Active. MetroNIDAZOLE (500MG  Tablet, Oral) Active. Neomycin Sulfate (500MG  Tablet, Oral) Active. Amino Acids ER (Oral) Active. Fish Oil + D3 (1000-1000MG -UNIT Capsule, Oral) Active. PriLOSEC (20MG  Capsule DR, Oral) Active. Saw Palmetto (160MG  Capsule, Oral) Active. Flomax (0.4MG  Capsule ER 24HR, Oral) Active. Vitamin C Plus (500MG  Tablet, Oral) Active.  Social History Briant Cedar, [place]; 06/15/2014 3:56 PM) Alcohol use Occasional alcohol use. Caffeine use Carbonated beverages, Coffee, Tea. No drug use Tobacco use Former smoker.  Family History Briant Cedar, [place]; 06/15/2014 3:56 PM) Arthritis Father. Colon Cancer Sister.     Review of Systems Briant Cedar CMA; 06/15/2014 3:56 PM) General Not Present- Appetite Loss, Chills,  Fatigue, Fever, Night Sweats, Weight Gain and Weight Loss. Skin Not Present- Change in Wart/Mole, Dryness, Hives, Jaundice, New Lesions, Non-Healing Wounds, Rash and Ulcer. HEENT Not Present- Earache, Hearing Loss, Hoarseness, Nose Bleed, Oral Ulcers, Ringing in  the Ears, Seasonal Allergies, Sinus Pain, Sore Throat, Visual Disturbances, Wears glasses/contact lenses and Yellow Eyes. Respiratory Not Present- Bloody sputum, Chronic Cough, Difficulty Breathing, Snoring and Wheezing. Breast Not Present- Breast Mass, Breast Pain, Nipple Discharge and Skin Changes. Cardiovascular Not Present- Chest Pain, Difficulty Breathing Lying Down, Leg Cramps, Palpitations, Rapid Heart Rate, Shortness of Breath and Swelling of Extremities. Gastrointestinal Not Present- Abdominal Pain, Bloating, Bloody Stool, Change in Bowel Habits, Chronic diarrhea, Constipation, Difficulty Swallowing, Excessive gas, Gets full quickly at meals, Hemorrhoids, Indigestion, Nausea, Rectal Pain and Vomiting. Male Genitourinary Present- Impotence and Painful Urination. Not Present- Blood in Urine, Change in Urinary Stream, Frequency, Nocturia, Urgency and Urine Leakage. Musculoskeletal Not Present- Back Pain, Joint Pain, Joint Stiffness, Muscle Pain, Muscle Weakness and Swelling of Extremities. Neurological Present- Numbness. Not Present- Decreased Memory, Fainting, Headaches, Seizures, Tingling, Tremor, Trouble walking and Weakness. Psychiatric Not Present- Anxiety, Bipolar, Change in Sleep Pattern, Depression, Fearful and Frequent crying. Endocrine Not Present- Cold Intolerance, Excessive Hunger, Hair Changes, Heat Intolerance, Hot flashes and New Diabetes. Hematology Not Present- Easy Bruising, Excessive bleeding, Gland problems, HIV and Persistent Infections.  Vitals Briant Cedar CMA; 06/15/2014 4:04 PM) 06/15/2014 4:03 PM Weight: 174.13 lb Height: 65in Body Surface Area: 1.9 m Body Mass Index: 28.98 kg/m Temp.: 96.23F   Pulse: 88 (Regular)  BP: 158/80 (Sitting, Left Arm, Standard)     Assessment & Plan Adin Hector MD; 06/16/2014 4:03 PM)  PRIMARY CANCER OF RECTUM (154.1  C20) Impression: Recovering from his low anterior resection with coloanal anastomosis complicated by colon necrosis requiring permanent colostomy.  At this point, can consider loop ileostomy takedown. Would wait at least 3 months from last CAT scan to make sure fluid collection has resolved = mid Nov. He has definitely turned the corner in a good way:  The anatomy & physiology of the digestive tract was discussed. The pathophysiology was discussed. Possibility of remaining with an ostomy permanently was discussed. I offered ostomy takedown. Laparoscopic & open techniques were discussed.  Risks such as bleeding, infection, abscess, leak, reoperation, possible re-ostomy, injury to other organs, hernia, heart attack, death, and other risks were discussed. I noted a good likelihood this will help address the problem. Goals of post-operative recovery were discussed as well. We will work to minimize complications. Questions were answered. The patient expresses understanding & wishes to proceed with surgery.  We'll try and have him see outpatient ostomy nursing to help troubleshoot irritation at his loop ileostomy site. Cena Benton  Current Plans Schedule for Surgery Pt Education - [facility] Ostomy HCI (Brentyn Seehafer) Pt Education - [facility] Good Bowel Health (Monque Haggar) IMPOTENCE (607.84  N52.9) Impression: Most likely related to the low anterior resection with necrosis and abscess. May recover partially but probably will need help of medication. See if urology can see for advice.  ILEOSTOMY CARE (V55.2  Z43.2) Impression: I strongly recommended appt with [facility] for ostomy troubleshooting. They are interested

## 2014-06-16 NOTE — H&P (Deleted)
[name] 06/15/2014 3:56 PM Location: [facility] Patient #: [id number] DOB: 05/22/1956 Married / Language: Cleophus Molt / Race: White Male History of Present Illness Adin Hector MD; 06/16/2014 4:02 PM) Patient words: Post Op f/u.  The patient is a 58 year old male presenting for a post-operative visit. Patient comes in today feeling better. Had very low rectal cancer. Underwent minimally invasive resection. Complicated by necrosis of colon requiring resection and permanent colostomy. Still has diverting loop ileostomy. Start with depression. Has left lower leg deformity with limited mobility. He has gradually improved and that. No longer in a wheelchair. Walking with a cane. Working on the farm some. Working to get disability. He is eating better. He comes today with his wife. No fevers or chills. Gaining weight. Empties ileostomy bag 4 times a day. No antidiarrheals. Some skin irritation. Has not seen an outpatient ostomy nurse. Has occasionally called a [employer] supply wrap. Still has information from our office as well. Ostomy care at rehabilitation/nursing center was marginal. He made a worker apron to help hold the ostomy bags. That helps. His depression is less. He is feeling more optimistic. He is happy that the cancer is gone. Come to terms with the last May. Does not want to go anywhere else for second opinion on takedown. He does note he has some difficulty with erections. No problems with urinary incontinence. Patient Care Team: [name], MD as PCP - General (Family Medicine) Satira Sark, MD as Consulting Physician (Cardiology) Danie Binder, MD as Consulting Physician (Gastroenterology) Milus Banister, MD as Attending Physician (Gastroenterology) Farrel Gobble, MD as Consulting Physician (Medical Oncology) Adin Hector, MD as Consulting Physician (General Surgery) Procedure (Date: 7/31/202015): POST-OPERATIVE DIAGNOSIS:  VERY LOW RECTAL CANCER PROCEDURE: ROBOTIC ASSISTED low anterior resection TRANSANAL TRANSABDOMINAL TME WITH HANDSEWN COLOANAL ANASTOMOSIS splenic flexure mobilization diverting loop ileostomy Surgeon(s): Adin Hector, MD Leighton Ruff, MD - Asst Procedure (Date 03/24/2014): POST-OPERATIVE DIAGNOSIS Massive ileus Ischemia/necrosis of distal colon at coloanal anastomosis PROCEDURE: FLEXIBLE SIGMOIDOSCOPY Exploratory laparotomy Takedown of coloanal anastomosis Distal colectomy & end colostomy Wound vac SURGEON: Surgeon(s): Adin Hector, MD ASSISTANT: Servando Snare, RNFA Other Problems Briant Cedar, [place]; 06/15/2014 3:56 PM) Gastroesophageal Reflux Disease High blood pressure  Past Surgical History Briant Cedar, [place]; 06/15/2014 3:56 PM) Colon Polyp Removal - Colonoscopy Colon Removal - Partial  Diagnostic Studies History Briant Cedar, [employer]; 06/15/2014 3:56 PM) Colonoscopy within last year  Allergies Briant Cedar, [place]; 06/15/2014 3:57 PM) No Known Drug Allergies 06/15/2014  Medication History Briant Cedar, CMA; 06/15/2014 4:02 PM) LORazepam (0.5MG  Tablet, Oral) Active. Metoprolol Tartrate (25MG  Tablet, Oral) Active. MetroNIDAZOLE (500MG  Tablet, Oral) Active. Neomycin Sulfate (500MG  Tablet, Oral) Active. Amino Acids ER (Oral) Active. Fish Oil + D3 (1000-1000MG -UNIT Capsule, Oral) Active. PriLOSEC (20MG  Capsule DR, Oral) Active. Saw Palmetto (160MG  Capsule, Oral) Active. Flomax (0.4MG  Capsule ER 24HR, Oral) Active. Vitamin C Plus (500MG  Tablet, Oral) Active.  Social History Briant Cedar, [place]; 06/15/2014 3:56 PM) Alcohol use Occasional alcohol use. Caffeine use Carbonated beverages, Coffee, Tea. No drug use Tobacco use Former smoker.  Family History Briant Cedar, [place]; 06/15/2014 3:56 PM) Arthritis Father. Colon Cancer Sister.     Review of Systems Briant Cedar CMA; 06/15/2014 3:56 PM) General Not Present- Appetite Loss, Chills,  Fatigue, Fever, Night Sweats, Weight Gain and Weight Loss. Skin Not Present- Change in Wart/Mole, Dryness, Hives, Jaundice, New Lesions, Non-Healing Wounds, Rash and Ulcer. HEENT Not Present- Earache, Hearing Loss, Hoarseness, Nose Bleed, Oral Ulcers, Ringing in  the Ears, Seasonal Allergies, Sinus Pain, Sore Throat, Visual Disturbances, Wears glasses/contact lenses and Yellow Eyes. Respiratory Not Present- Bloody sputum, Chronic Cough, Difficulty Breathing, Snoring and Wheezing. Breast Not Present- Breast Mass, Breast Pain, Nipple Discharge and Skin Changes. Cardiovascular Not Present- Chest Pain, Difficulty Breathing Lying Down, Leg Cramps, Palpitations, Rapid Heart Rate, Shortness of Breath and Swelling of Extremities. Gastrointestinal Not Present- Abdominal Pain, Bloating, Bloody Stool, Change in Bowel Habits, Chronic diarrhea, Constipation, Difficulty Swallowing, Excessive gas, Gets full quickly at meals, Hemorrhoids, Indigestion, Nausea, Rectal Pain and Vomiting. Male Genitourinary Present- Impotence and Painful Urination. Not Present- Blood in Urine, Change in Urinary Stream, Frequency, Nocturia, Urgency and Urine Leakage. Musculoskeletal Not Present- Back Pain, Joint Pain, Joint Stiffness, Muscle Pain, Muscle Weakness and Swelling of Extremities. Neurological Present- Numbness. Not Present- Decreased Memory, Fainting, Headaches, Seizures, Tingling, Tremor, Trouble walking and Weakness. Psychiatric Not Present- Anxiety, Bipolar, Change in Sleep Pattern, Depression, Fearful and Frequent crying. Endocrine Not Present- Cold Intolerance, Excessive Hunger, Hair Changes, Heat Intolerance, Hot flashes and New Diabetes. Hematology Not Present- Easy Bruising, Excessive bleeding, Gland problems, HIV and Persistent Infections.  Vitals Briant Cedar CMA; 06/15/2014 4:04 PM) 06/15/2014 4:03 PM Weight: 174.13 lb Height: 65in Body Surface Area: 1.9 m Body Mass Index: 28.98 kg/m Temp.: 96.36F   Pulse: 88 (Regular)  BP: 158/80 (Sitting, Left Arm, Standard)     Assessment & Plan Adin Hector MD; 06/16/2014 4:03 PM)  PRIMARY CANCER OF RECTUM (154.1  C20) Impression: Recovering from his low anterior resection with coloanal anastomosis complicated by colon necrosis requiring permanent colostomy.  At this point, can consider loop ileostomy takedown. Would wait at least 3 months from last CAT scan to make sure fluid collection has resolved = mid Nov. He has definitely turned the corner in a good way:  The anatomy & physiology of the digestive tract was discussed. The pathophysiology was discussed. Possibility of remaining with an ostomy permanently was discussed. I offered ostomy takedown. Laparoscopic & open techniques were discussed.  Risks such as bleeding, infection, abscess, leak, reoperation, possible re-ostomy, injury to other organs, hernia, heart attack, death, and other risks were discussed. I noted a good likelihood this will help address the problem. Goals of post-operative recovery were discussed as well. We will work to minimize complications. Questions were answered. The patient expresses understanding & wishes to proceed with surgery.  We'll try and have him see outpatient ostomy nursing to help troubleshoot irritation at his loop ileostomy site. Cena Benton  Current Plans Schedule for Surgery Pt Education - [facility] Ostomy HCI (Shalia Bartko) Pt Education - [facility] Good Bowel Health (Dali Kraner) IMPOTENCE (607.84  N52.9) Impression: Most likely related to the low anterior resection with necrosis and abscess. May recover partially but probably will need help of medication. See if urology can see for advice.  ILEOSTOMY CARE (V55.2  Z43.2) Impression: I strongly recommended appt with [facility] for ostomy troubleshooting. They are interested

## 2014-06-22 ENCOUNTER — Other Ambulatory Visit: Payer: Self-pay | Admitting: *Deleted

## 2014-06-22 MED ORDER — OXYCODONE HCL 5 MG PO TABS: ORAL_TABLET | ORAL | Status: DC

## 2014-06-22 NOTE — Telephone Encounter (Signed)
Received written order fax from [name] that patient arrived at [facility] without hard copy for Oxycodone and needs Rx faxed to [facility] Fax # [phone]. Printed and faxed.

## 2014-06-23 ENCOUNTER — Ambulatory Visit (INDEPENDENT_AMBULATORY_CARE_PROVIDER_SITE_OTHER): Payer: Medicaid Other | Admitting: Family Medicine

## 2014-06-23 ENCOUNTER — Encounter: Payer: Self-pay | Admitting: Family Medicine

## 2014-06-23 VITALS — BP 132/86 | Ht 64.0 in | Wt 175.6 lb

## 2014-06-23 DIAGNOSIS — I10 Essential (primary) hypertension: Secondary | ICD-10-CM

## 2014-06-23 DIAGNOSIS — M2170 Unequal limb length (acquired), unspecified site: Secondary | ICD-10-CM

## 2014-06-23 DIAGNOSIS — H539 Unspecified visual disturbance: Secondary | ICD-10-CM

## 2014-06-23 NOTE — Progress Notes (Signed)
   Subjective:    Patient ID: [name], male    DOB: 09-Jun-1956, 58 y.o.   MRN: [id number]  HPI  Patient arrives to discuss getting some referrals in place. Patient states    His insurance has been approved and he needs referral to eye doctor for detached retina, hx of eye doc. Stated pt had detatold needed to see an eye doc.patient had experienced increase amount of floaters in his left eye. Was told he should see an eye doctor after discharge.  [facility] an ophthalm in [facility].  Needs ref to [facility] for foot eval. Has had a serious hip injury and needs a spacer device placed in left foot.  Dr Carlis Abbott, Was name of ophthalmologist who saw patient in the medical is Thursday a lot somewhat checked and on mom Thursday morning meeting year well as is her   [phone]  and referral to doctor to get prosthetic device for foot. [phone] Review of Systems No headache no chest pain no back pain no abdominal pain ROS otherwise negative    Objective:   Physical Exam  Alert no apparent distress blood pressure good. Patient has gained weight. Lungs clear. Heart regular in rhythm. Abdomen soft. Left leg length abnormality secondary to old hip injury ankles without edema      Assessment & Plan:  Impression 1 suboptimum nutrition improved #2 visual disturbance with advice in the hospital to have eye reassess by ophthalmologist #3 left leg foreshortened by old traumatic hip injury patient needs referral for foot support plan multiple referrals. Maintain same medications. Diet discussed compliance discussed recheck in 4 months. WSL

## 2014-07-10 ENCOUNTER — Ambulatory Visit: Payer: Self-pay | Admitting: Family Medicine

## 2014-07-23 ENCOUNTER — Telehealth: Payer: Self-pay | Admitting: Gastroenterology

## 2014-07-23 NOTE — Telephone Encounter (Signed)
Patient called today to give [place] his medicaid number. [id number] N

## 2014-07-29 NOTE — Telephone Encounter (Signed)
I called pt to see if there was anything else he needed and he said no, he just needed to make sure we have his medicaid number. It is listed for his insurance now.

## 2014-08-05 ENCOUNTER — Telehealth: Payer: Self-pay | Admitting: Family Medicine

## 2014-08-05 NOTE — Telephone Encounter (Signed)
Please complete CMN for patient in blue folder.  They are needing this ASAP.

## 2014-08-17 ENCOUNTER — Other Ambulatory Visit (HOSPITAL_COMMUNITY): Payer: Self-pay | Admitting: *Deleted

## 2014-08-17 NOTE — Patient Instructions (Addendum)
[name]  08/17/2014   Your procedure is scheduled on: 08/25/14   Report to [facility]  Entrance and follow signs to               [place] at 5:30 AM.   Call this number if you have problems the morning of surgery [phone]   Remember:  Do not eat food or drink liquids :After Midnight.     Take these medicines the morning of surgery with A SIP OF WATER: METOPROLOL / PRILOSEC / OXYCODONE IF NEEDED            STOP ASPIRIN AND HERBAL MEDS 5 DAYS PREOP                               You may not have any metal on your body including hair pins and              piercings  Do not wear jewelry, make-up, lotions, powders or perfumes.             Do not wear nail polish.  Do not shave  48 hours prior to surgery.              Men may shave face and neck.   Do not bring valuables to the hospital. [place].  Contacts, dentures or bridgework may not be worn into surgery.  Leave suitcase in the car. After surgery it may be brought to your room.     Patients discharged the day of surgery will not be allowed to drive home.  Name and phone number of your driver:  Special Instructions: N/A              Please read over the following fact sheets you were given: _____________________________________________________________________                                                     [place]  Before surgery, you can play an important role.  Because skin is not sterile, your skin needs to be as free of germs as possible.  You can reduce the number of germs on your skin by washing with CHG (chlorahexidine gluconate) soap before surgery.  CHG is an antiseptic cleaner which kills germs and bonds with the skin to continue killing germs even after washing. Please DO NOT use if you have an allergy to CHG or antibacterial soaps.  If your skin becomes reddened/irritated stop using the  CHG and inform your nurse when you arrive at Short Stay. Do not shave (including legs and underarms) for at least 48 hours prior to the first CHG shower.  You may shave your face. Please follow these instructions carefully:   1.  Shower with CHG Soap the night before surgery and the  morning of Surgery.   2.  If you choose to wash your hair, wash your hair first as usual with your  normal  Shampoo.   3.  After you shampoo, rinse your hair and body thoroughly to remove the  shampoo.  4.  Use CHG as you would any other liquid soap.  You can apply chg directly  to the skin and wash . Gently wash with scrungie or clean wascloth    5.  Apply the CHG Soap to your body ONLY FROM THE NECK DOWN.   Do not use on open                           Wound or open sores. Avoid contact with eyes, ears mouth and genitals (private parts).                        Genitals (private parts) with your normal soap.              6.  Wash thoroughly, paying special attention to the area where your surgery  will be performed.   7.  Thoroughly rinse your body with warm water from the neck down.   8.  DO NOT shower/wash with your normal soap after using and rinsing off  the CHG Soap .                9.  Pat yourself dry with a clean towel.             10.  Wear clean pajamas.             11.  Place clean sheets on your bed the night of your first shower and do not  sleep with pets.  Day of Surgery : Do not apply any lotions/deodorants the morning of surgery.  Please wear clean clothes to the hospital/surgery center.  FAILURE TO FOLLOW THESE INSTRUCTIONS MAY RESULT IN THE CANCELLATION OF YOUR SURGERY    PATIENT SIGNATURE_________________________________  ______________________________________________________________________    WHAT IS A BLOOD TRANSFUSION? Blood Transfusion Information  A transfusion is the replacement of blood or some of its parts. Blood is made up of  multiple cells which provide different functions.  Red blood cells carry oxygen and are used for blood loss replacement.  White blood cells fight against infection.  Platelets control bleeding.  Plasma helps clot blood.  Other blood products are available for specialized needs, such as hemophilia or other clotting disorders. BEFORE THE TRANSFUSION  Who gives blood for transfusions?   Healthy volunteers who are fully evaluated to make sure their blood is safe. This is blood bank blood. Transfusion therapy is the safest it has ever been in the practice of medicine. Before blood is taken from a donor, a complete history is taken to make sure that person has no history of diseases nor engages in risky social behavior (examples are intravenous drug use or sexual activity with multiple partners). The donor's travel history is screened to minimize risk of transmitting infections, such as malaria. The donated blood is tested for signs of infectious diseases, such as HIV and hepatitis. The blood is then tested to be sure it is compatible with you in order to minimize the chance of a transfusion reaction. If you or a relative donates blood, this is often done in anticipation of surgery and is not appropriate for emergency situations. It takes many days to process the donated blood. RISKS AND COMPLICATIONS Although transfusion therapy is very safe and saves many lives, the main dangers of transfusion include:   Getting an infectious disease.  Developing a transfusion reaction. This is an allergic reaction to something in the blood you were given. Every  precaution is taken to prevent this. The decision to have a blood transfusion has been considered carefully by your caregiver before blood is given. Blood is not given unless the benefits outweigh the risks. AFTER THE TRANSFUSION  Right after receiving a blood transfusion, you will usually feel much better and more energetic. This is especially true if  your red blood cells have gotten low (anemic). The transfusion raises the level of the red blood cells which carry oxygen, and this usually causes an energy increase.  The nurse administering the transfusion will monitor you carefully for complications. HOME CARE INSTRUCTIONS  No special instructions are needed after a transfusion. You may find your energy is better. Speak with your caregiver about any limitations on activity for underlying diseases you may have. SEEK MEDICAL CARE IF:   Your condition is not improving after your transfusion.  You develop redness or irritation at the intravenous (IV) site. SEEK IMMEDIATE MEDICAL CARE IF:  Any of the following symptoms occur over the next 12 hours:  Shaking chills.  You have a temperature by mouth above 102 F (38.9 C), not controlled by medicine.  Chest, back, or muscle pain.  People around you feel you are not acting correctly or are confused.  Shortness of breath or difficulty breathing.  Dizziness and fainting.  You get a rash or develop hives.  You have a decrease in urine output.  Your urine turns a dark color or changes to pink, red, or brown. Any of the following symptoms occur over the next 10 days:  You have a temperature by mouth above 102 F (38.9 C), not controlled by medicine.  Shortness of breath.  Weakness after normal activity.  The white part of the eye turns yellow (jaundice).  You have a decrease in the amount of urine or are urinating less often.  Your urine turns a dark color or changes to pink, red, or brown. Document Released: 08/04/2000 Document Revised: 10/30/2011 Document Reviewed: 03/23/2008 [facility] Patient Information 2014 [place], [place].  _______________________________________________________________________

## 2014-08-19 ENCOUNTER — Ambulatory Visit (HOSPITAL_COMMUNITY)
Admission: RE | Admit: 2014-08-19 | Discharge: 2014-08-19 | Disposition: A | Discharge status: Home / Self Care | Payer: Medicaid Other | Source: Ambulatory Visit | Attending: Anesthesiology | Admitting: Anesthesiology

## 2014-08-19 ENCOUNTER — Encounter (HOSPITAL_COMMUNITY)
Admission: RE | Admit: 2014-08-19 | Discharge: 2014-08-19 | Disposition: A | Discharge status: Home / Self Care | Payer: Medicaid Other | Source: Ambulatory Visit | Attending: Surgery | Admitting: Surgery

## 2014-08-19 ENCOUNTER — Encounter (HOSPITAL_COMMUNITY): Payer: Self-pay

## 2014-08-19 ENCOUNTER — Encounter (INDEPENDENT_AMBULATORY_CARE_PROVIDER_SITE_OTHER): Payer: Self-pay

## 2014-08-19 DIAGNOSIS — Z01818 Encounter for other preprocedural examination: Secondary | ICD-10-CM | POA: Diagnosis not present

## 2014-08-19 DIAGNOSIS — Z932 Ileostomy status: Secondary | ICD-10-CM | POA: Diagnosis not present

## 2014-08-19 DIAGNOSIS — Z933 Colostomy status: Secondary | ICD-10-CM | POA: Insufficient documentation

## 2014-08-19 DIAGNOSIS — I10 Essential (primary) hypertension: Secondary | ICD-10-CM

## 2014-08-19 HISTORY — DX: Congenital shortening of unspecified lower limb: Q72.819

## 2014-08-19 HISTORY — DX: Other difficulties with micturition: R39.198

## 2014-08-19 HISTORY — DX: Ileostomy status: Z93.2

## 2014-08-19 HISTORY — DX: Essential (primary) hypertension: I10

## 2014-08-19 HISTORY — DX: Colostomy status: Z93.3

## 2014-08-19 LAB — CBC
HEMATOCRIT: 45.8 % (ref 39.0–52.0)
Hemoglobin: 14.9 g/dL (ref 13.0–17.0)
MCH: 28.1 pg (ref 26.0–34.0)
MCHC: 32.5 g/dL (ref 30.0–36.0)
MCV: 86.4 fL (ref 78.0–100.0)
Platelets: 362 10*3/uL (ref 150–400)
RBC: 5.3 MIL/uL (ref 4.22–5.81)
RDW: 17 % — AB (ref 11.5–15.5)
WBC: 8.9 10*3/uL (ref 4.0–10.5)

## 2014-08-19 LAB — BASIC METABOLIC PANEL
Anion gap: 7 (ref 5–15)
BUN: 22 mg/dL (ref 6–23)
CALCIUM: 9.5 mg/dL (ref 8.4–10.5)
CO2: 28 mmol/L (ref 19–32)
CREATININE: 0.8 mg/dL (ref 0.50–1.35)
Chloride: 102 mEq/L (ref 96–112)
GLUCOSE: 89 mg/dL (ref 70–99)
Potassium: 4.5 mmol/L (ref 3.5–5.1)
Sodium: 137 mmol/L (ref 135–145)

## 2014-08-19 LAB — HEMOGLOBIN A1C
Hgb A1c MFr Bld: 6.2 % — ABNORMAL HIGH (ref <5.7)
Mean Plasma Glucose: 131 mg/dL — ABNORMAL HIGH (ref <117)

## 2014-08-19 NOTE — Progress Notes (Signed)
PT seen by Milana Na , ostomy nurse,  at PST appt

## 2014-08-19 NOTE — Progress Notes (Signed)
   08/19/14 1037  OBSTRUCTIVE SLEEP APNEA  Have you ever been diagnosed with sleep apnea through a sleep study? No  Do you snore loudly (loud enough to be heard through closed doors)?  0  Do you often feel tired, fatigued, or sleepy during the daytime? 1  Has anyone observed you stop breathing during your sleep? 0  Do you have, or are you being treated for high blood pressure? 1  BMI more than 35 kg/m2? 0  Age over 57 years old? 1  Neck circumference greater than 40 cm/16 inches? 1  Gender: 1  Obstructive Sleep Apnea Score 5  Score 4 or greater  Results sent to PCP

## 2014-08-19 NOTE — Consult Note (Signed)
WOC ostomy follow up Stoma type/location: RUQ Ileostomy and LLQ Colostomy. Patient and wife are participating in today's session in advance of patient's scheduled ileostomy takedown surgery on Tuesday, January 5th. Patient with many questions regarding ongoing colostomy stoma management, supplies, routines, etc.  Is concerned that colostomy has not ever functioned and is now wondering what life with a permanent colostomy will be like. Wants to wear smaller pouching supplies than he does currently over this stoma if possible. Has established routines with ileostomy. Identifies fear and anxiety over the unknown. Ostomy pouching: 2pc. (for each; 2 and 1/4 for ileostomy and 2 and 3/4 for colostomy) Education provided: Patient, wife counseled that ostomy nurse will follow immediately post surgery on 08/25/14 and will assess and advise on appropriate ostomy pouching systems for colostomy as soon as function returns.  Taught that colostomy output is thicker and more predictable than ileostomy output but reminded him/them that each ostomy is different.  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 Patient's questions answered and he is reassured that ostomy nurse will be an active participant in his care next week. [place] nursing team will follow, and will remain available to this patient, his wife, the nursing, surgical and medical teams.  Thanks, Maudie Flakes, MSN, RN, [place], [place], [place] [phone])

## 2014-08-20 NOTE — Progress Notes (Signed)
HgA1C done 08/19/2014 faxed via EPIC to Dr Johney [place] via [facility].

## 2014-08-20 NOTE — Progress Notes (Signed)
I have reviewed the tests.  There are no major concerns.  Okay to proceed with surgery from my standpoint. 

## 2014-08-25 ENCOUNTER — Inpatient Hospital Stay (HOSPITAL_COMMUNITY): Payer: Medicaid Other | Admitting: *Deleted

## 2014-08-25 ENCOUNTER — Encounter (HOSPITAL_COMMUNITY)
Admission: RE | Disposition: A | Discharge status: Home / Self Care | Payer: Self-pay | Source: Ambulatory Visit | Attending: Surgery

## 2014-08-25 ENCOUNTER — Inpatient Hospital Stay (HOSPITAL_COMMUNITY)
Admission: RE | Admit: 2014-08-25 | Discharge: 2014-08-28 | DRG: 345 | Disposition: A | Discharge status: Home / Self Care | Payer: Medicaid Other | Source: Ambulatory Visit | Attending: Surgery | Admitting: Surgery

## 2014-08-25 ENCOUNTER — Encounter (HOSPITAL_COMMUNITY): Payer: Self-pay | Admitting: *Deleted

## 2014-08-25 DIAGNOSIS — Z800 Family history of malignant neoplasm of digestive organs: Secondary | ICD-10-CM

## 2014-08-25 DIAGNOSIS — Z6830 Body mass index (BMI) 30.0-30.9, adult: Secondary | ICD-10-CM | POA: Diagnosis not present

## 2014-08-25 DIAGNOSIS — K435 Parastomal hernia without obstruction or  gangrene: Secondary | ICD-10-CM | POA: Diagnosis present

## 2014-08-25 DIAGNOSIS — Z432 Encounter for attention to ileostomy: Secondary | ICD-10-CM | POA: Diagnosis present

## 2014-08-25 DIAGNOSIS — Z87891 Personal history of nicotine dependence: Secondary | ICD-10-CM

## 2014-08-25 DIAGNOSIS — Z9049 Acquired absence of other specified parts of digestive tract: Secondary | ICD-10-CM | POA: Diagnosis present

## 2014-08-25 DIAGNOSIS — Z933 Colostomy status: Secondary | ICD-10-CM

## 2014-08-25 DIAGNOSIS — C20 Malignant neoplasm of rectum: Secondary | ICD-10-CM | POA: Diagnosis present

## 2014-08-25 DIAGNOSIS — M1990 Unspecified osteoarthritis, unspecified site: Secondary | ICD-10-CM | POA: Diagnosis present

## 2014-08-25 DIAGNOSIS — E669 Obesity, unspecified: Secondary | ICD-10-CM | POA: Diagnosis present

## 2014-08-25 DIAGNOSIS — I10 Essential (primary) hypertension: Secondary | ICD-10-CM | POA: Diagnosis present

## 2014-08-25 DIAGNOSIS — K219 Gastro-esophageal reflux disease without esophagitis: Secondary | ICD-10-CM | POA: Diagnosis present

## 2014-08-25 DIAGNOSIS — F419 Anxiety disorder, unspecified: Secondary | ICD-10-CM | POA: Diagnosis present

## 2014-08-25 HISTORY — PX: EXAMINATION UNDER ANESTHESIA: SHX1540

## 2014-08-25 HISTORY — PX: ILEOSTOMY CLOSURE: SHX1784

## 2014-08-25 HISTORY — DX: Malignant neoplasm of rectum: C20

## 2014-08-25 LAB — TYPE AND SCREEN
ABO/RH(D): A NEG
ANTIBODY SCREEN: NEGATIVE

## 2014-08-25 LAB — GLUCOSE, CAPILLARY: GLUCOSE-CAPILLARY: 128 mg/dL — AB (ref 70–99)

## 2014-08-25 SURGERY — CLOSURE, ILEOSTOMY
Anesthesia: General | Site: Anus

## 2014-08-25 MED ORDER — ACETAMINOPHEN 500 MG PO TABS
1000.0000 mg | ORAL_TABLET | Freq: Three times a day (TID) | ORAL | Status: DC
  Administered 2014-08-25 – 2014-08-28 (×9): 1000 mg via ORAL
  Filled 2014-08-25 (×13): qty 2

## 2014-08-25 MED ORDER — PROPOFOL 10 MG/ML IV BOLUS
INTRAVENOUS | Status: DC | PRN
  Administered 2014-08-25: 150 mg via INTRAVENOUS
  Administered 2014-08-25: 30 mg via INTRAVENOUS

## 2014-08-25 MED ORDER — BUPIVACAINE-EPINEPHRINE 0.25% -1:200000 IJ SOLN
INTRAMUSCULAR | Status: DC | PRN
  Administered 2014-08-25: 60 mL

## 2014-08-25 MED ORDER — ONDANSETRON HCL 4 MG PO TABS: 4.0000 mg | ORAL_TABLET | Freq: Three times a day (TID) | ORAL | Status: DC | PRN

## 2014-08-25 MED ORDER — HYDROMORPHONE HCL 2 MG/ML IJ SOLN
INTRAMUSCULAR | Status: AC
  Filled 2014-08-25: qty 1

## 2014-08-25 MED ORDER — HYDRALAZINE HCL 20 MG/ML IJ SOLN
5.0000 mg | Freq: Once | INTRAMUSCULAR | Status: AC
  Administered 2014-08-25: 5 mg via INTRAVENOUS

## 2014-08-25 MED ORDER — TAMSULOSIN HCL 0.4 MG PO CAPS
0.4000 mg | ORAL_CAPSULE | Freq: Every day | ORAL | Status: DC
  Administered 2014-08-26 – 2014-08-28 (×3): 0.4 mg via ORAL
  Filled 2014-08-25 (×3): qty 1

## 2014-08-25 MED ORDER — HYDROMORPHONE HCL 1 MG/ML IJ SOLN
0.5000 mg | INTRAMUSCULAR | Status: DC | PRN
  Administered 2014-08-25 – 2014-08-27 (×7): 1 mg via INTRAVENOUS
  Filled 2014-08-25 (×7): qty 1

## 2014-08-25 MED ORDER — METOCLOPRAMIDE HCL 5 MG/ML IJ SOLN
INTRAMUSCULAR | Status: AC
  Filled 2014-08-25: qty 2

## 2014-08-25 MED ORDER — LACTATED RINGERS IV BOLUS (SEPSIS): 1000.0000 mL | Freq: Three times a day (TID) | INTRAVENOUS | Status: AC | PRN

## 2014-08-25 MED ORDER — PROPOFOL 10 MG/ML IV BOLUS
INTRAVENOUS | Status: AC
  Filled 2014-08-25: qty 20

## 2014-08-25 MED ORDER — VITAMIN C 500 MG PO TABS
500.0000 mg | ORAL_TABLET | Freq: Two times a day (BID) | ORAL | Status: DC
  Administered 2014-08-25 – 2014-08-28 (×5): 500 mg via ORAL
  Filled 2014-08-25 (×7): qty 1

## 2014-08-25 MED ORDER — ACETAMINOPHEN 325 MG PO TABS: 650.0000 mg | ORAL_TABLET | Freq: Four times a day (QID) | ORAL | Status: DC | PRN

## 2014-08-25 MED ORDER — KETOROLAC TROMETHAMINE 30 MG/ML IJ SOLN
INTRAMUSCULAR | Status: DC | PRN
  Administered 2014-08-25: 30 mg via INTRAVENOUS

## 2014-08-25 MED ORDER — CISATRACURIUM BESYLATE (PF) 10 MG/5ML IV SOLN
INTRAVENOUS | Status: DC | PRN
  Administered 2014-08-25: 2 mg via INTRAVENOUS
  Administered 2014-08-25: 8 mg via INTRAVENOUS

## 2014-08-25 MED ORDER — METOPROLOL TARTRATE 12.5 MG HALF TABLET
12.5000 mg | ORAL_TABLET | Freq: Two times a day (BID) | ORAL | Status: DC
  Administered 2014-08-26 – 2014-08-28 (×4): 12.5 mg via ORAL
  Filled 2014-08-25 (×7): qty 1

## 2014-08-25 MED ORDER — BUPIVACAINE-EPINEPHRINE 0.25% -1:200000 IJ SOLN
INTRAMUSCULAR | Status: AC
  Filled 2014-08-25: qty 1

## 2014-08-25 MED ORDER — SODIUM CHLORIDE 0.9 % IV SOLN
INTRAVENOUS | Status: DC
  Administered 2014-08-25: 14:00:00 via INTRAVENOUS
  Administered 2014-08-26: 1000 mL via INTRAVENOUS

## 2014-08-25 MED ORDER — LIP MEDEX EX OINT
1.0000 "application " | TOPICAL_OINTMENT | Freq: Two times a day (BID) | CUTANEOUS | Status: DC
  Administered 2014-08-25 – 2014-08-28 (×6): 1 via TOPICAL
  Filled 2014-08-25 (×2): qty 7

## 2014-08-25 MED ORDER — DIPHENHYDRAMINE HCL 50 MG/ML IJ SOLN: 12.5000 mg | Freq: Four times a day (QID) | INTRAMUSCULAR | Status: DC | PRN

## 2014-08-25 MED ORDER — DEXAMETHASONE SODIUM PHOSPHATE 10 MG/ML IJ SOLN
INTRAMUSCULAR | Status: AC
  Filled 2014-08-25: qty 1

## 2014-08-25 MED ORDER — ALUM & MAG HYDROXIDE-SIMETH 200-200-20 MG/5ML PO SUSP: 30.0000 mL | Freq: Four times a day (QID) | ORAL | Status: DC | PRN

## 2014-08-25 MED ORDER — GLYCOPYRROLATE 0.2 MG/ML IJ SOLN
INTRAMUSCULAR | Status: AC
  Filled 2014-08-25: qty 3

## 2014-08-25 MED ORDER — MAGIC MOUTHWASH
15.0000 mL | Freq: Four times a day (QID) | ORAL | Status: DC | PRN
  Filled 2014-08-25: qty 15

## 2014-08-25 MED ORDER — DEXTROSE 5 % IV SOLN
2.0000 g | Freq: Two times a day (BID) | INTRAVENOUS | Status: AC
  Administered 2014-08-25: 2 g via INTRAVENOUS
  Filled 2014-08-25: qty 2

## 2014-08-25 MED ORDER — HYDROMORPHONE HCL 1 MG/ML IJ SOLN
INTRAMUSCULAR | Status: DC | PRN
  Administered 2014-08-25: 1 mg via INTRAVENOUS

## 2014-08-25 MED ORDER — HYDRALAZINE HCL 20 MG/ML IJ SOLN
INTRAMUSCULAR | Status: AC
Start: 2014-08-25 — End: 2014-08-25
  Filled 2014-08-25: qty 1

## 2014-08-25 MED ORDER — LORATADINE 10 MG PO TABS
10.0000 mg | ORAL_TABLET | Freq: Every day | ORAL | Status: DC
  Administered 2014-08-26 – 2014-08-28 (×3): 10 mg via ORAL
  Filled 2014-08-25 (×3): qty 1

## 2014-08-25 MED ORDER — CEFOTETAN DISODIUM-DEXTROSE 2-2.08 GM-% IV SOLR
INTRAVENOUS | Status: AC
  Filled 2014-08-25: qty 50

## 2014-08-25 MED ORDER — METOPROLOL TARTRATE 1 MG/ML IV SOLN
5.0000 mg | Freq: Four times a day (QID) | INTRAVENOUS | Status: DC
  Administered 2014-08-26 – 2014-08-27 (×3): 5 mg via INTRAVENOUS
  Filled 2014-08-25 (×17): qty 5

## 2014-08-25 MED ORDER — MENTHOL 3 MG MT LOZG
1.0000 | LOZENGE | OROMUCOSAL | Status: DC | PRN
  Filled 2014-08-25 (×2): qty 9

## 2014-08-25 MED ORDER — KETOROLAC TROMETHAMINE 30 MG/ML IJ SOLN
INTRAMUSCULAR | Status: AC
  Filled 2014-08-25: qty 1

## 2014-08-25 MED ORDER — FENTANYL CITRATE 0.05 MG/ML IJ SOLN
INTRAMUSCULAR | Status: AC
  Filled 2014-08-25: qty 5

## 2014-08-25 MED ORDER — HYDROMORPHONE HCL 1 MG/ML IJ SOLN
0.2500 mg | INTRAMUSCULAR | Status: DC | PRN
  Administered 2014-08-25 (×4): 0.5 mg via INTRAVENOUS

## 2014-08-25 MED ORDER — LACTATED RINGERS IV SOLN
INTRAVENOUS | Status: DC | PRN
  Administered 2014-08-25 (×3): via INTRAVENOUS

## 2014-08-25 MED ORDER — BUPIVACAINE-EPINEPHRINE (PF) 0.25% -1:200000 IJ SOLN
INTRAMUSCULAR | Status: AC
  Filled 2014-08-25: qty 30

## 2014-08-25 MED ORDER — PANTOPRAZOLE SODIUM 40 MG PO TBEC
40.0000 mg | DELAYED_RELEASE_TABLET | Freq: Every day | ORAL | Status: DC
  Administered 2014-08-26 – 2014-08-28 (×3): 40 mg via ORAL
  Filled 2014-08-25 (×3): qty 1

## 2014-08-25 MED ORDER — PHENOL 1.4 % MT LIQD
2.0000 | OROMUCOSAL | Status: DC | PRN
  Filled 2014-08-25: qty 177

## 2014-08-25 MED ORDER — MIDAZOLAM HCL 5 MG/5ML IJ SOLN
INTRAMUSCULAR | Status: DC | PRN
Start: 2014-08-25 — End: 2014-08-25
  Administered 2014-08-25 (×2): 1 mg via INTRAVENOUS

## 2014-08-25 MED ORDER — ALVIMOPAN 12 MG PO CAPS
12.0000 mg | ORAL_CAPSULE | Freq: Once | ORAL | Status: AC
  Administered 2014-08-25: 12 mg via ORAL
  Filled 2014-08-25: qty 1

## 2014-08-25 MED ORDER — LORAZEPAM 0.5 MG PO TABS: 0.5000 mg | ORAL_TABLET | Freq: Two times a day (BID) | ORAL | Status: DC | PRN

## 2014-08-25 MED ORDER — MIDAZOLAM HCL 2 MG/2ML IJ SOLN
INTRAMUSCULAR | Status: AC
  Filled 2014-08-25: qty 2

## 2014-08-25 MED ORDER — PROMETHAZINE HCL 25 MG/ML IJ SOLN: 6.2500 mg | INTRAMUSCULAR | Status: DC | PRN

## 2014-08-25 MED ORDER — HEPARIN SODIUM (PORCINE) 5000 UNIT/ML IJ SOLN
5000.0000 [IU] | Freq: Once | INTRAMUSCULAR | Status: AC
  Administered 2014-08-25: 5000 [IU] via SUBCUTANEOUS
  Filled 2014-08-25: qty 1

## 2014-08-25 MED ORDER — HEPARIN SODIUM (PORCINE) 5000 UNIT/ML IJ SOLN
5000.0000 [IU] | Freq: Three times a day (TID) | INTRAMUSCULAR | Status: DC
  Administered 2014-08-26 – 2014-08-28 (×7): 5000 [IU] via SUBCUTANEOUS
  Filled 2014-08-25 (×10): qty 1

## 2014-08-25 MED ORDER — LABETALOL HCL 5 MG/ML IV SOLN
INTRAVENOUS | Status: AC
  Filled 2014-08-25: qty 4

## 2014-08-25 MED ORDER — JOBST FOR MEN 15-20MMHG MED MISC: 1.0000 | Freq: Every day | Status: DC

## 2014-08-25 MED ORDER — HYDROMORPHONE HCL 1 MG/ML IJ SOLN
INTRAMUSCULAR | Status: AC
  Filled 2014-08-25: qty 1

## 2014-08-25 MED ORDER — ALVIMOPAN 12 MG PO CAPS
12.0000 mg | ORAL_CAPSULE | Freq: Two times a day (BID) | ORAL | Status: DC
  Administered 2014-08-26 – 2014-08-27 (×4): 12 mg via ORAL
  Filled 2014-08-25 (×6): qty 1

## 2014-08-25 MED ORDER — SACCHAROMYCES BOULARDII 250 MG PO CAPS
250.0000 mg | ORAL_CAPSULE | Freq: Two times a day (BID) | ORAL | Status: DC
  Administered 2014-08-25 – 2014-08-28 (×7): 250 mg via ORAL
  Filled 2014-08-25 (×8): qty 1

## 2014-08-25 MED ORDER — ONDANSETRON HCL 4 MG/2ML IJ SOLN
INTRAMUSCULAR | Status: DC | PRN
  Administered 2014-08-25: 4 mg via INTRAVENOUS

## 2014-08-25 MED ORDER — DIPHENHYDRAMINE HCL 12.5 MG/5ML PO ELIX: 12.5000 mg | ORAL_SOLUTION | Freq: Four times a day (QID) | ORAL | Status: DC | PRN

## 2014-08-25 MED ORDER — METOCLOPRAMIDE HCL 5 MG/ML IJ SOLN
INTRAMUSCULAR | Status: DC | PRN
  Administered 2014-08-25: 10 mg via INTRAVENOUS

## 2014-08-25 MED ORDER — TAB-A-VITE/IRON PO TABS
1.0000 | ORAL_TABLET | Freq: Every day | ORAL | Status: DC
  Administered 2014-08-26: 1 via ORAL
  Filled 2014-08-25 (×2): qty 1

## 2014-08-25 MED ORDER — DEXTROSE 5 % IV SOLN
2.0000 g | INTRAVENOUS | Status: AC
  Administered 2014-08-25: 2 g via INTRAVENOUS

## 2014-08-25 MED ORDER — ASPIRIN EC 81 MG PO TBEC
81.0000 mg | DELAYED_RELEASE_TABLET | Freq: Every day | ORAL | Status: DC
  Administered 2014-08-25 – 2014-08-28 (×4): 81 mg via ORAL
  Filled 2014-08-25 (×4): qty 1

## 2014-08-25 MED ORDER — ONDANSETRON HCL 4 MG/2ML IJ SOLN
INTRAMUSCULAR | Status: AC
  Filled 2014-08-25: qty 2

## 2014-08-25 MED ORDER — LABETALOL HCL 5 MG/ML IV SOLN
INTRAVENOUS | Status: DC | PRN
  Administered 2014-08-25 (×3): 5 mg via INTRAVENOUS

## 2014-08-25 MED ORDER — 0.9 % SODIUM CHLORIDE (POUR BTL) OPTIME
TOPICAL | Status: DC | PRN
Start: 2014-08-25 — End: 2014-08-25
  Administered 2014-08-25: 1000 mL

## 2014-08-25 MED ORDER — FENTANYL CITRATE 0.05 MG/ML IJ SOLN
INTRAMUSCULAR | Status: DC | PRN
  Administered 2014-08-25 (×5): 50 ug via INTRAVENOUS

## 2014-08-25 MED ORDER — ONDANSETRON HCL 4 MG PO TABS: 4.0000 mg | ORAL_TABLET | Freq: Four times a day (QID) | ORAL | Status: DC | PRN

## 2014-08-25 MED ORDER — SUCCINYLCHOLINE CHLORIDE 20 MG/ML IJ SOLN
INTRAMUSCULAR | Status: DC | PRN
  Administered 2014-08-25: 100 mg via INTRAVENOUS

## 2014-08-25 MED ORDER — NEOSTIGMINE METHYLSULFATE 10 MG/10ML IV SOLN
INTRAVENOUS | Status: AC
Start: 2014-08-25 — End: 2014-08-25
  Filled 2014-08-25: qty 1

## 2014-08-25 MED ORDER — NEOSTIGMINE METHYLSULFATE 10 MG/10ML IV SOLN
INTRAVENOUS | Status: DC | PRN
  Administered 2014-08-25: 5 mg via INTRAVENOUS

## 2014-08-25 MED ORDER — ONDANSETRON HCL 4 MG/2ML IJ SOLN
4.0000 mg | Freq: Four times a day (QID) | INTRAMUSCULAR | Status: DC | PRN
  Administered 2014-08-25 – 2014-08-26 (×2): 4 mg via INTRAVENOUS
  Filled 2014-08-25 (×2): qty 2

## 2014-08-25 MED ORDER — GLYCOPYRROLATE 0.2 MG/ML IJ SOLN
INTRAMUSCULAR | Status: DC | PRN
  Administered 2014-08-25: 0.6 mg via INTRAVENOUS

## 2014-08-25 MED ORDER — OXYCODONE HCL 5 MG PO TABS: 5.0000 mg | ORAL_TABLET | ORAL | Status: DC | PRN

## 2014-08-25 MED ORDER — CISATRACURIUM BESYLATE 20 MG/10ML IV SOLN
INTRAVENOUS | Status: AC
  Filled 2014-08-25: qty 10

## 2014-08-25 SURGICAL SUPPLY — 58 items
BLADE EXTENDED COATED 6.5IN (ELECTRODE) ×3 IMPLANT
BLADE HEX COATED 2.75 (ELECTRODE) IMPLANT
BLADE SURG SZ10 CARB STEEL (BLADE) IMPLANT
CATH KIT ON-Q SILVERSOAK 7.5IN (CATHETERS) IMPLANT
CLAMP POUCH DRAINAGE QUIET (OSTOMY) ×3 IMPLANT
COUNTER NEEDLE 20 DBL MAG RED (NEEDLE) IMPLANT
COVER MAYO STAND STRL (DRAPES) ×3 IMPLANT
DRAIN CHANNEL 19F RND (DRAIN) IMPLANT
DRAPE LAPAROSCOPIC ABDOMINAL (DRAPES) ×3 IMPLANT
DRAPE SHEET LG 3/4 BI-LAMINATE (DRAPES) IMPLANT
DRAPE UTILITY XL STRL (DRAPES) ×3 IMPLANT
DRAPE WARM FLUID 44X44 (DRAPE) IMPLANT
DRSG OPSITE POSTOP 4X10 (GAUZE/BANDAGES/DRESSINGS) IMPLANT
DRSG OPSITE POSTOP 4X6 (GAUZE/BANDAGES/DRESSINGS) ×3 IMPLANT
DRSG OPSITE POSTOP 4X8 (GAUZE/BANDAGES/DRESSINGS) IMPLANT
ELECT REM PT RETURN 9FT ADLT (ELECTROSURGICAL) ×3
ELECTRODE REM PT RTRN 9FT ADLT (ELECTROSURGICAL) ×2 IMPLANT
GAUZE SPONGE 4X4 12PLY STRL (GAUZE/BANDAGES/DRESSINGS) ×3 IMPLANT
GLOVE ECLIPSE 8.0 STRL XLNG CF (GLOVE) ×6 IMPLANT
GLOVE INDICATOR 8.0 STRL GRN (GLOVE) ×6 IMPLANT
GOWN STRL REUS W/TWL XL LVL3 (GOWN DISPOSABLE) ×9 IMPLANT
HOLDER FOLEY CATH W/STRAP (MISCELLANEOUS) ×3 IMPLANT
KIT BASIN OR (CUSTOM PROCEDURE TRAY) ×3 IMPLANT
LEGGING LITHOTOMY PAIR STRL (DRAPES) IMPLANT
LIGASURE IMPACT 36 18CM CVD LR (INSTRUMENTS) IMPLANT
PACK GENERAL/GYN (CUSTOM PROCEDURE TRAY) ×3 IMPLANT
PENCIL BUTTON HOLSTER BLD 10FT (ELECTRODE) IMPLANT
POUCH OSTOMY 1 PC DRNBL  2 1/2 (OSTOMY) ×2 IMPLANT
POUCH OSTOMY 2 1/2 (OSTOMY) ×1
STAPLER 90 3.5 STAND SLIM (STAPLE) ×3
STAPLER 90 3.5 STD SLIM (STAPLE) ×2 IMPLANT
STAPLER PROXIMATE 75MM BLUE (STAPLE) ×3 IMPLANT
STAPLER VISISTAT 35W (STAPLE) ×3 IMPLANT
SUCTION POOLE TIP (SUCTIONS) ×3 IMPLANT
SUT MNCRL AB 4-0 PS2 18 (SUTURE) ×3 IMPLANT
SUT NOV 1 T60/GS (SUTURE) IMPLANT
SUT NOVA NAB DX-16 0-1 5-0 T12 (SUTURE) IMPLANT
SUT NOVA T20/GS 25 (SUTURE) IMPLANT
SUT PDS AB 1 CT1 27 (SUTURE) ×6 IMPLANT
SUT PDS AB 1 CTX 36 (SUTURE) IMPLANT
SUT SILK 2 0 (SUTURE) ×1
SUT SILK 2 0 SH CR/8 (SUTURE) ×3 IMPLANT
SUT SILK 2 0SH CR/8 30 (SUTURE) IMPLANT
SUT SILK 2-0 18XBRD TIE 12 (SUTURE) ×2 IMPLANT
SUT SILK 3 0 (SUTURE) ×1
SUT SILK 3 0 SH CR/8 (SUTURE) ×3 IMPLANT
SUT SILK 3-0 18XBRD TIE 12 (SUTURE) ×2 IMPLANT
SUT VIC AB 2-0 SH 18 (SUTURE) IMPLANT
SUT VIC AB 3-0 SH 18 (SUTURE) IMPLANT
SUT VICRYL 2 0 18  UND BR (SUTURE)
SUT VICRYL 2 0 18 UND BR (SUTURE) IMPLANT
TAPE UMBILICAL COTTON 1/8X30 (MISCELLANEOUS) ×3 IMPLANT
TOWEL OR 17X26 10 PK STRL BLUE (TOWEL DISPOSABLE) ×6 IMPLANT
TOWEL OR NON WOVEN STRL DISP B (DISPOSABLE) ×3 IMPLANT
TRAY FOLEY CATH 14FRSI W/METER (CATHETERS) IMPLANT
TRAY FOLEY CATH 16FRSI W/METER (SET/KITS/TRAYS/PACK) ×3 IMPLANT
TUNNELER SHEATH ON-Q 16GX12 DP (PAIN MANAGEMENT) IMPLANT
YANKAUER SUCT BULB TIP 10FT TU (MISCELLANEOUS) IMPLANT

## 2014-08-25 NOTE — Progress Notes (Signed)
Dr. Delma Post made aware of patient's blood pressures in PACU  And pre-op blood pressures-also made aware of medication received pre-op and intra-op for blood pressure- orders given- Apresoline 5 mg IVP GIVEN

## 2014-08-25 NOTE — H&P (Signed)
[address] [place], [place] [address] Phone: [phone] FAX: [place]  1956/07/23 [id number]  CARE TEAM:  PCP: Rubbie Battiest, MD  Outpatient Care Team: Patient Care Team: [name], MD as PCP - General (Family Medicine) Satira Sark, MD as Consulting Physician (Cardiology) Danie Binder, MD as Consulting Physician (Gastroenterology) Milus Banister, MD as Attending Physician (Gastroenterology) Farrel Gobble, MD as Consulting Physician (Medical Oncology) Michael Boston, MD as Consulting Physician (General Surgery) Reece Packer, MD as Consulting Physician (Urology) Danie Binder, MD as Consulting Physician (Gastroenterology)  Inpatient Treatment Team: Treatment Team: Attending Provider: Michael Boston, MD  Was not male with rectal cancer status post resection with diverting loop ileostomy. 59  Had necrosis of anastomosis needing recurrent resection and permanent end colostomy for the very distal rectal cancer.  Energy level better.  Walking better.  No fevers chills or sweats.  No personal nor family history of inflammatory bowel disease, irritable bowel syndrome, allergy such as Celiac Sprue, dietary/dairy problems, colitis, ulcers nor gastritis.  No recent sick contacts/gastroenteritis.  No travel outside the country.  No changes in diet.  No dysphagia to solids or liquids.  No significant heartburn or reflux.  No hematochezia, hematemesis, coffee ground emesis.  No evidence of prior gastric/peptic ulceration.    Past Medical History  Diagnosis Date  . GERD (gastroesophageal reflux disease)   . Arthritis   . Aspiration pneumonia 03/24/2014  . Hypertension   . Cancer     COLON  . Difficulty urinating   . Anxiety   . Colostomy in place   . Ileostomy present   . Shortening, leg, congenital     LEFT    Past Surgical History  Procedure Laterality Date  . Colonoscopy  N/A 12/22/2013    Procedure: COLONOSCOPY;  Surgeon: Danie Binder, MD;  Location: [facility];  Service: Endoscopy;  Laterality: N/A;  8:30  . Esophagogastroduodenoscopy N/A 12/22/2013    Procedure: ESOPHAGOGASTRODUODENOSCOPY (EGD);  Surgeon: Danie Binder, MD;  Location: [facility];  Service: Endoscopy;  Laterality: N/A;  . Eus N/A 01/01/2014    Procedure: LOWER ENDOSCOPIC ULTRASOUND (EUS);  Surgeon: Milus Banister, MD;  Location: [facility];  Service: Endoscopy;  Laterality: N/A;  . Hip fracture surgery Left age 59 on 04-06-1970  . Eye reattachment and ear reattachment  Right 1971  . Colon resection N/A 03/24/2014    Procedure: Diagnostic  laparotomy, takedown of coloanal anastamosis, end colostomy, with wound vac;  Surgeon: Adin Hector, MD;  Location: [facility] ORS;  Service: General;  Laterality: N/A;  . Flexible sigmoidoscopy N/A 03/24/2014    Procedure: FLEXIBLE SIGMOIDOSCOPY;  Surgeon: Adin Hector, MD;  Location: [facility] ORS;  Service: General;  Laterality: N/A;  . Ileostomy    . Colostomy      History   Social History  . Marital Status: Married    Spouse Name: N/A    Number of Children: N/A  . Years of Education: N/A   Occupational History  . Not on file.   Social History Main Topics  . Smoking status: Former Smoker -- 0.00 packs/day for 15 years    Types: Cigars    Quit date: 09/10/2011  . Smokeless tobacco: Never Used  . Alcohol Use: No     Comment: quit years ago  . Drug Use: No  . Sexual Activity: Not on file  Other Topics Concern  . 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    Family History  Problem Relation Age of Onset  . Colon polyps Neg Hx   . Colon cancer Sister     Current Facility-Administered Medications  Medication Dose Route Frequency Provider Last Rate Last Dose  . cefoTEtan (CEFOTAN) 2 g in dextrose 5 % 50 mL IVPB  2 g Intravenous On Call to OR Michael Boston, MD       Facility-Administered Medications  Ordered in Other Encounters  Medication Dose Route Frequency Provider Last Rate Last Dose  . lactated ringers infusion    Continuous PRN Lacey A Armistead, CRNA         No Known Allergies  ROS: Constitutional:  No fevers, chills, sweats.  Weight stable Eyes:  No vision changes, No discharge HENT:  No sore throats, nasal drainage Lymph: No neck swelling, No bruising easily Pulmonary:  No cough, productive sputum CV: No orthopnea, PND  No exertional chest/neck/shoulder/arm pain. GI:  No personal nor family history of inflammatory bowel disease, irritable bowel syndrome, allergy such as Celiac Sprue, dietary/dairy problems, colitis, ulcers nor gastritis.  No recent sick contacts/gastroenteritis.  No travel outside the country.  No changes in diet. Renal: No UTIs, No hematuria Genital:  No drainage, bleeding, masses Musculoskeletal: No severe joint pain.  Good ROM major joints Skin:  No sores or lesions.  No rashes Heme/Lymph:  No easy bleeding.  No swollen lymph nodes Neuro: No focal weakness/numbness.  No seizures Psych: No suicidal ideation.  No hallucinations  BP 142/98 mmHg  Pulse 66  Temp(Src) 97.7 F (36.5 C) (Oral)  Resp 18  Ht 5\' 5"  (1.651 m)  Wt 184 lb (83.462 kg)  BMI 30.62 kg/m2  SpO2 98%  Physical Exam: General: Pt awake/alert/oriented x4 in no major acute distress Eyes: PERRL, normal EOM. Sclera nonicteric Neuro: CN II-XII intact w/o focal sensory/motor deficits. Lymph: No head/neck/groin lymphadenopathy Psych:  No delerium/psychosis/paranoia HENT: Normocephalic, Mucus membranes moist.  No thrush Neck: Supple, No tracheal deviation Chest: No pain.  Good respiratory excursion. CV:  Pulses intact.  Regular rhythm Abdomen: Soft, Nondistended.  Ostomies pink.  Nontender.  No incarcerated hernias. Ext:  SCDs BLE.  No significant edema.  No cyanosis Skin: No petechiae / purpurea.  No major sores Musculoskeletal: No severe joint pain.  Good ROM major  joints   Results:   Labs: No results found for this or any previous visit (from the past 48 hour(s)).  Imaging / Studies: Dg Chest 2 View  08/19/2014   CLINICAL DATA:  Hypertension.  Preoperative chest x-ray.  EXAM: CHEST  2 VIEW  COMPARISON:  None.  FINDINGS: Mediastinum and hilar structures normal. Lungs are clear. Heart size normal. No pleural effusion or pneumothorax. No acute bony abnormality. Degenerative changes thoracic spine.  IMPRESSION: No active pulmonary disease.   Electronically Signed   By: Marcello Moores  Register   On: 08/19/2014 13:28    Medications / Allergies: per chart  Antibiotics: Anti-infectives    Start     Dose/Rate Route Frequency Ordered Stop   08/25/14 0532  cefoTEtan (CEFOTAN) 2 g in dextrose 5 % 50 mL IVPB     2 g100 mL/hr over 30 Minutes Intravenous On call to O.R. 08/25/14 0532 08/26/14 0559      Assessment  [name]  59 y.o. male  Day of Surgery  Procedure(s): TAKEDOWN OF LOOP ILEOSTOMY AND EXAM UNDER ANESTHESIA  Problem List:  Principal Problem:   Rectal cancer, uT2uN0, s/p LAR/ileostomy 03/20/2014 colostomy [id number] Active Problems:   GERD (gastroesophageal reflux disease)   Obesity (BMI 30-39.9)   Essential hypertension, benign   Rectal cancer no evidence of recurrence.  Diverting loop ileostomy  Plan:  Loop ileostomy takedown.  Examination under anesthesia.  The anatomy & physiology of the digestive tract was discussed.  The pathophysiology was discussed.  Possibility of remaining with an ostomy permanently was discussed.  I offered ostomy takedown.  Laparoscopic & open techniques were discussed.   Risks such as bleeding, infection, abscess, leak, reoperation, possible re-ostomy, injury to other organs, hernia, heart attack, death, and other risks were discussed.   I noted a good likelihood this will help address the problem.  Goals of post-operative recovery were discussed as well.  We will work to minimize complications.  Questions  were answered.  The patient expresses understanding & wishes to proceed with surgery.    -VTE prophylaxis- SCDs, etc -mobilize as tolerated to help recovery    Adin Hector, M.D., F.A.C.S. Gastrointestinal and Minimally Invasive Surgery [facility] [address] [place], [place] [address] [phone] Main / Paging   08/25/2014  Note: Portions of this report may have been transcribed using voice recognition software. Every effort was made to ensure accuracy; however, inadvertent computerized transcription errors may be present.   Any transcriptional errors that result from this process are unintentional.

## 2014-08-25 NOTE — Anesthesia Preprocedure Evaluation (Addendum)
Anesthesia Evaluation  Patient identified by MRN, date of birth, ID band Patient awake    Reviewed: Allergy & Precautions, NPO status , Patient's Chart, lab work & pertinent test results  Airway Mallampati: II  TM Distance: >3 FB Neck ROM: Full    Dental  (+) Dental Advisory Given, Poor Dentition Many broken and missing teeth.:   Pulmonary pneumonia -, resolved, former smoker,  breath sounds clear to auscultation  Pulmonary exam normal       Cardiovascular hypertension, Pt. on medications and Pt. on home beta blockers Rhythm:Regular Rate:Normal  Atypical chest pain   Neuro/Psych Anxiety negative neurological ROS     GI/Hepatic Neg liver ROS, GERD-  Medicated,  Endo/Other  negative endocrine ROS  Renal/GU negative Renal ROS  negative genitourinary   Musculoskeletal  (+) Arthritis -,   Abdominal   Peds negative pediatric ROS (+)  Hematology negative hematology ROS (+)   Anesthesia Other Findings   Reproductive/Obstetrics negative OB ROS                            Anesthesia Physical Anesthesia Plan  ASA: III  Anesthesia Plan: General   Post-op Pain Management:    Induction: Intravenous  Airway Management Planned: Oral ETT  Additional Equipment:   Intra-op Plan:   Post-operative Plan: Extubation in OR  Informed Consent: I have reviewed the patients History and Physical, chart, labs and discussed the procedure including the risks, benefits and alternatives for the proposed anesthesia with the patient or authorized representative who has indicated his/her understanding and acceptance.   Dental advisory given  Plan Discussed with: CRNA  Anesthesia Plan Comments:         Anesthesia Quick Evaluation

## 2014-08-25 NOTE — Op Note (Signed)
08/25/2014  9:09 AM  PATIENT:  [name]  59 y.o. male  Patient Care Team: [name], MD as PCP - General (Family Medicine) Satira Sark, MD as Consulting Physician (Cardiology) Danie Binder, MD as Consulting Physician (Gastroenterology) Milus Banister, MD as Attending Physician (Gastroenterology) Farrel Gobble, MD as Consulting Physician (Medical Oncology) Michael Boston, MD as Consulting Physician (General Surgery) Reece Packer, MD as Consulting Physician (Urology) Danie Binder, MD as Consulting Physician (Gastroenterology)  PRE-OPERATIVE DIAGNOSIS:    Loop Ileostomy in place Rectal cancer with end colostomy in place  POST-OPERATIVE DIAGNOSIS:  Loop Ileostomy in place  PROCEDURE:  Procedure(s): TAKEDOWN OF LOOP ILEOSTOMY  EXAM UNDER ANESTHESIA  SURGEON:  Surgeon(s): Michael Boston, MD  ANESTHESIA:   local and general  EBL:  Total I/O In: 1000 [I.V.:1000] Out: 100 [Urine:100]  Delay start of Pharmacological VTE agent (>24hrs) due to surgical blood loss or risk of bleeding:  no  DRAINS: none   SPECIMEN:  Source of Specimen:  Ileostomy  DISPOSITION OF SPECIMEN:  PATHOLOGY  COUNTS:  YES  PLAN OF CARE: Admit to inpatient   PATIENT DISPOSITION:  PACU - hemodynamically stable.  INDICATION:  Patient requiring diverting loop ileostomy.  The patient has recovered to the point not requiring fecal diversion.  Requested ostomy takedown.  I felt was appropriate this time. I recommended takedown of ileostomy and examination under anesthesia:   The anatomy & physiology of the digestive tract was discussed. The pathophysiology was discussed. Possibility of remaining with an ostomy permanently was discussed. I offered ostomy takedown. Laparoscopic & open techniques were discussed.   Risks such as bleeding, infection, abscess, leak, reoperation, possible re-ostomy, injury to other organs, hernia, heart attack, death, and other risks were discussed. I  noted a good likelihood this will help address the problem. Goals of post-operative recovery were discussed as well. We will work to minimize complications. Questions were answered. The patient expresses understanding & wishes to proceed with surgery.   OR FINDINGS:   Anus shut with no rectal stump.   Moderate adhesions of loop ileostomy to abdominal wall. Mild parastomal hernia at ileostomy  It is a side-to-side stapled ileoileal anastomosis close to the terminal ileum.   DESCRIPTION:   Informed consent was confirmed. The patient received IV antibiotics and underwent general anesthesia without any difficulty. The patient was positioned in low litholtomy. Foley catheter was sterilely placed. SCDs were active during the entire case. The abdomen was cprepped and draped in a sterile fashion. A surgical timeout confirmed our plan.   I performed examination under anesthesia of the perineum. I can palpate the anastomosis in the neorectum.  Findings as above.  Mild dilation done with a finger.  No evidence of fistula leak or irritation. Sphincter intact.   We changed gown/gloves. Surgical timeout confirmed for ileostomy takedown. I proceeded to make a circular incision around the loop ileostomy. The loop ileostomy was freed from adhesions to the subcutaneous and fascial layers of the abdominal wall using focused cautery and sharp dissection.  I was able to breech safely into the peritoneum. No adhesions felt. We could mildly eviscerate the small bowel just proximal/distal to the loop ileostomy.   I did a side-to-side stapled anastomosis using a GIA-75 stapler.  We then used a TLX 60 to staple off the common defect.   I used interrupted 2-0 silk stitches to close the mesenteric defect and bury the TX staple line. Hemostasis was excellent. Ileostomy returned into the peritoneal  cavity.  We changed gloves. Wound irrigated. Fascia closed using #1 PDS in a running fashion. Skin excised for a more transverse  elliptical wound to allow it to come together without dog-earing. Closed with interrupted 4 Monocryl, umbilical tape wicks x 4 , and sterile dressing.   Patient extubated and in recovery room in stable condition. I discussed postoperative findings with the patient's family. Questions answered. They expressed understanding and appreciation.   [name], M.D., F.A.C.S. Gastrointestinal and Minimally Invasive Surgery [facility] [address], [place], [place] [address] [phone] Main / Paging

## 2014-08-25 NOTE — Anesthesia Postprocedure Evaluation (Signed)
  Anesthesia Post-op Note  Patient: [name]  Procedure(s) Performed: Procedure(s) (LRB): TAKEDOWN OF LOOP ILEOSTOMY  (N/A) EXAM UNDER ANESTHESIA  Patient Location: PACU  Anesthesia Type: General  Level of Consciousness: awake and alert   Airway and Oxygen Therapy: Patient Spontanous Breathing  Post-op Pain: mild  Post-op Assessment: Post-op Vital signs reviewed, Patient's Cardiovascular Status Stable, Respiratory Function Stable, Patent Airway and No signs of Nausea or vomiting  Last Vitals:  Filed Vitals:   08/25/14 1130  BP: 104/71  Pulse: 65  Temp:   Resp: 12    Post-op Vital Signs: stable   Complications: No apparent anesthesia complications

## 2014-08-25 NOTE — Transfer of Care (Signed)
Immediate Anesthesia Transfer of Care Note  Patient: [name]  Procedure(s) Performed: Procedure(s): TAKEDOWN OF LOOP ILEOSTOMY  (N/A) EXAM UNDER ANESTHESIA  Patient Location: PACU  Anesthesia Type:General  Level of Consciousness: Patient easily awoken, sedated, comfortable, cooperative, following commands, responds to stimulation.   Airway & Oxygen Therapy: Patient spontaneously breathing, ventilating well, oxygen via simple oxygen mask.  Post-op Assessment: Report given to PACU RN, vital signs reviewed and stable, moving all extremities.   Post vital signs: Reviewed and stable.  Complications: No apparent anesthesia complications

## 2014-08-25 NOTE — Anesthesia Procedure Notes (Signed)
Procedure Name: Intubation Date/Time: 08/25/2014 7:39 AM Performed by: Michael Boston Pre-anesthesia Checklist: Patient identified, Emergency Drugs available, Suction available and Patient being monitored Patient Re-evaluated:Patient Re-evaluated prior to inductionOxygen Delivery Method: Circle system utilized Preoxygenation: Pre-oxygenation with 100% oxygen Intubation Type: IV induction Ventilation: Mask ventilation without difficulty Laryngoscope Size: Mac and 4 Tube size: 8.0 mm Number of attempts: 1 (grade 1 view) Placement Confirmation: ETT inserted through vocal cords under direct vision,  breath sounds checked- equal and bilateral and positive ETCO2 Secured at: 21 cm Tube secured with: Tape Dental Injury: Teeth and Oropharynx as per pre-operative assessment  Comments: Grade 1 view

## 2014-08-26 ENCOUNTER — Encounter (HOSPITAL_COMMUNITY): Payer: Self-pay | Admitting: Surgery

## 2014-08-26 DIAGNOSIS — Z933 Colostomy status: Secondary | ICD-10-CM

## 2014-08-26 LAB — BASIC METABOLIC PANEL
Anion gap: 7 (ref 5–15)
BUN: 21 mg/dL (ref 6–23)
CO2: 27 mmol/L (ref 19–32)
Calcium: 8.2 mg/dL — ABNORMAL LOW (ref 8.4–10.5)
Chloride: 103 mEq/L (ref 96–112)
Creatinine, Ser: 0.92 mg/dL (ref 0.50–1.35)
GFR calc Af Amer: >90 mL/min (ref >90)
GFR calc non Af Amer: >90 mL/min (ref >90)
GLUCOSE: 104 mg/dL — AB (ref 70–99)
POTASSIUM: 4.1 mmol/L (ref 3.5–5.1)
Sodium: 137 mmol/L (ref 135–145)

## 2014-08-26 LAB — MAGNESIUM: MAGNESIUM: 1.9 mg/dL (ref 1.5–2.5)

## 2014-08-26 LAB — CBC
HCT: 36.7 % — ABNORMAL LOW (ref 39.0–52.0)
Hemoglobin: 11.8 g/dL — ABNORMAL LOW (ref 13.0–17.0)
MCH: 27.8 pg (ref 26.0–34.0)
MCHC: 32.2 g/dL (ref 30.0–36.0)
MCV: 86.4 fL (ref 78.0–100.0)
Platelets: 289 10*3/uL (ref 150–400)
RBC: 4.25 MIL/uL (ref 4.22–5.81)
RDW: 17 % — AB (ref 11.5–15.5)
WBC: 10.6 10*3/uL — ABNORMAL HIGH (ref 4.0–10.5)

## 2014-08-26 MED ORDER — LACTATED RINGERS IV BOLUS (SEPSIS): 1000.0000 mL | Freq: Three times a day (TID) | INTRAVENOUS | Status: AC | PRN

## 2014-08-26 MED ORDER — SODIUM CHLORIDE 0.9 % IV SOLN: 250.0000 mL | INTRAVENOUS | Status: DC | PRN

## 2014-08-26 MED ORDER — SODIUM CHLORIDE 0.9 % IJ SOLN: 3.0000 mL | INTRAMUSCULAR | Status: DC | PRN

## 2014-08-26 MED ORDER — SODIUM CHLORIDE 0.9 % IJ SOLN
3.0000 mL | Freq: Two times a day (BID) | INTRAMUSCULAR | Status: DC
  Administered 2014-08-26 – 2014-08-27 (×2): 3 mL via INTRAVENOUS

## 2014-08-26 MED ORDER — SILVER NITRATE-POT NITRATE 75-25 % EX MISC
1.0000 | CUTANEOUS | Status: DC | PRN
  Filled 2014-08-26: qty 1

## 2014-08-26 NOTE — Care Management Note (Signed)
    Page 1 of 1   08/26/2014     10:28:35 AM CARE MANAGEMENT NOTE 08/26/2014  Patient:  [name], [name]   Account Number:  [id number]  Date Initiated:  08/26/2014  Documentation initiated by:  Sunday Spillers  Subjective/Objective Assessment:   59 yo male admitted s/p ostomy takedown. PTA lived at home with spouse.     Action/Plan:   Home when stable   Anticipated DC Date:  08/29/2014   Anticipated DC Plan:  [place]  CM consult      Choice offered to / List presented to:             Status of service:  Completed, signed off Medicare Important Message given?   (If response is "NO", the following Medicare IM given date fields will be blank) Date Medicare IM given:   Medicare IM given by:   Date Additional Medicare IM given:   Additional Medicare IM given by:    Discharge Disposition:  HOME/SELF CARE  Per UR Regulation:  Reviewed for med. necessity/level of care/duration of stay  If discussed at [place] [facility] [place] of Stay Meetings, dates discussed:    Comments:

## 2014-08-26 NOTE — Evaluation (Signed)
Physical Therapy One Time Evaluation Patient Details Name: [name] MRN: [id number] DOB: 1956/01/04 Today's Date: 08/26/2014   History of Present Illness  Pt is a 59 year old male with hx of rectal cancer with end colostomy and currently s/p takedown of loop ileostomy.  Clinical Impression  Patient evaluated by Physical Therapy with no further acute PT needs identified. All education has been completed and the patient has no further questions. Pt ambulated in hallway with RW and then progressed to [facility].  Pt mobilizing very well.  Pt reports going to SNF upon last admission and learning proper gait pattern and strengthening for L LLD.  Reviewed techniques and pt comfortable with returning to using [place] upon d/c. No further follow-up Physical Therapy or equipment needs identified. PT is signing off. Thank you for this referral.     Follow Up Recommendations No PT follow up    Equipment Recommendations  None recommended by PT    Recommendations for Other Services       Precautions / Restrictions Precautions Precautions: None      Mobility  Bed Mobility                  Transfers Overall transfer level: Needs assistance Equipment used: Rolling walker (2 wheeled) Transfers: Sit to/from Stand Sit to Stand: Supervision;Modified independent (Device/Increase time)            Ambulation/Gait Ambulation/Gait assistance: Supervision;Modified independent (Device/Increase time) Ambulation Distance (Feet): 1200 Feet Assistive device: Rolling walker (2 wheeled);Straight cane Gait Pattern/deviations: Step-through pattern;Decreased stride length     General Gait Details: pt ambulated with RW 400 feet well so progressed to [place], no unsteadiness or LOB observed, pt with slight R lateral lean he reports due to pain so discussed proper posture during gait, pt also with shorter heel lift in shoes here so noticeable LLD and toe out however he reports this is improved with his  higher shoe lift in his work shoes, pt reports he will use SPC upon d/c for safety  Stairs            Wheelchair Mobility    Modified Rankin (Stroke Patients Only)       Balance                                             Pertinent Vitals/Pain Pain Assessment: 0-10 Pain Score: 3  Pain Location: R flank Pain Descriptors / Indicators: Tender Pain Intervention(s): Premedicated before session    Home Living Family/patient expects to be discharged to:: Private residence Living Arrangements: Spouse/significant other Available Help at Discharge: Family;Available 24 hours/day Type of Home: Mobile home Home Access: Stairs to enter Entrance Stairs-Rails: None Entrance Stairs-Number of Steps: 4 Home Layout: Multi-level Home Equipment: Cane - single point      Prior Function Level of Independence: Independent with assistive device(s)         Comments: uses SPC, stiill works on the farm     [employer]        Extremity/Trunk Assessment               Lower Extremity Assessment: LLE deficits/detail   LLE Deficits / Details: short L LE (LLD), typically wears shoe lift to correct     Communication   Communication: No difficulties  Cognition Arousal/Alertness: Awake/alert Behavior During Therapy: WFL for tasks assessed/performed Overall Cognitive Status: Within  Functional Limits for tasks assessed                      General Comments      Exercises        Assessment/Plan    PT Assessment Patent does not need any further PT services  PT Diagnosis     PT Problem List    PT Treatment Interventions     PT Goals (Current goals can be found in the Care Plan section) Acute Rehab PT Goals PT Goal Formulation: All assessment and education complete, DC therapy    Frequency     Barriers to discharge        Co-evaluation               End of Session   Activity Tolerance: Patient tolerated treatment  well Patient left: in bed;with call bell/phone within reach;with family/visitor present           Time: 1683-7290 PT Time Calculation (min) (ACUTE ONLY): 21 min   Charges:   PT Evaluation $Initial PT Evaluation Tier I: 1 Procedure PT Treatments $Gait Training: 8-22 mins   PT G Codes:        Makhiya Coburn,KATHrine E 08/26/2014, 3:47 PM Carmelia Bake, PT, DPT 08/26/2014 Pager: [phone]

## 2014-08-26 NOTE — Progress Notes (Signed)
[address] [place], [place] [address] Phone: [phone] FAX: [place] [phone] 07/30/1956  CARE TEAM:  PCP: Rubbie Battiest, MD  Outpatient Care Team: Patient Care Team: [name], MD as PCP - General (Family Medicine) Satira Sark, MD as Consulting Physician (Cardiology) Danie Binder, MD as Consulting Physician (Gastroenterology) Milus Banister, MD as Attending Physician (Gastroenterology) Farrel Gobble, MD as Consulting Physician (Medical Oncology) Michael Boston, MD as Consulting Physician (General Surgery) Reece Packer, MD as Consulting Physician (Urology) Danie Binder, MD as Consulting Physician (Gastroenterology)  Inpatient Treatment Team: Treatment Team: Attending Provider: Michael Boston, MD; Registered Nurse: Emeterio Reeve, RN; Registered Nurse: Arminda Resides, RN; Technician: Val Riles, NT; Registered Nurse: Eugenie Birks, RN; Technician: Leda Quail, NT   Subjective:  No events Tol liquids PO Walked in hallways Pain controlled Talking on phone to his preacher  Objective:  Vital signs:  Filed Vitals:   08/26/14 0010 08/26/14 0230 08/26/14 0534 08/26/14 0637  BP: 106/67 104/64 102/64   Pulse: 91 88 88   Temp: 98 F (36.7 C) 98.8 F (37.1 C) 98.2 F (36.8 C)   TempSrc: Oral Oral Oral   Resp:  16 15   Height:      Weight:    181 lb 14.1 oz (82.5 kg)  SpO2: 94% 84% 92%        Intake/Output   Yesterday:  01/05 0701 - 01/06 0700 In: 4152.5 [P.O.:540; I.V.:3612.5] Out: 2600 [Urine:2600] This shift:     Bowel function:  Flatus: n  BM: n  Drain: n/a  Physical Exam:  General: Pt awake/alert/oriented x4 in no acute distress Eyes: PERRL, normal EOM.  Sclera clear.  No icterus Neuro: CN II-XII intact w/o focal sensory/motor deficits. Lymph: No head/neck/groin lymphadenopathy Psych:  No delerium/psychosis/paranoia.   Calm, smiling HENT: Normocephalic, Mucus membranes moist.  No thrush Neck: Supple, No tracheal deviation Chest: No chest wall pain w good excursion CV:  Pulses intact.  Regular rhythm MS: Normal AROM mjr joints.  No obvious deformity Abdomen: Soft.  Nondistended.  Mildly tender at old ileostomy incision RLQ only.  No evidence of peritonitis.  No incarcerated hernias. Ext:  SCDs BLE.  No mjr edema.  No cyanosis Skin: No petechiae / purpura   Problem List:   Principal Problem:   Rectal cancer, uT2uN0, s/p LAR/ileostomy 03/20/2014 colostomy [id number] Active Problems:   GERD (gastroesophageal reflux disease)   Obesity (BMI 30-39.9)   Essential hypertension, benign   Colostomy in place   Assessment  [name]  59 y.o. male  1 Day Post-Op  Procedure(s): TAKEDOWN OF LOOP ILEOSTOMY  EXAM UNDER ANESTHESIA  Recovering   Plan:  -adv diet -foley d/c'd -wean IVF -transition to PO meds gradually -HTN controlled -PPI for GERD -VTE prophylaxis- SCDs, etc -mobilize as tolerated to help recovery.  PT re-eval w h/o chronic lower limb contractures, etc  I updated the patient's status & OR fiondings/Tx to the patient.  Recommendations were made.  Questions were answered.  The patient expressed understanding & appreciation.    Adin Hector, M.D., F.A.C.S. Gastrointestinal and Minimally Invasive Surgery [facility] [address] [place], [place] [address] [phone] Main / Paging   08/26/2014   Results:   Labs: Results for orders placed or performed during the hospital encounter of 08/25/14 (from the past 48 hour(s))  Glucose, capillary  Status: Abnormal   Collection Time: 08/25/14 11:44 PM  Result Value Ref Range   Glucose-Capillary 128 (H) 70 - 99 mg/dL  CBC     Status: Abnormal   Collection Time: 08/26/14  5:00 AM  Result Value Ref Range   WBC 10.6 (H) 4.0 - 10.5 K/uL   RBC 4.25 4.22 - 5.81 MIL/uL   Hemoglobin 11.8 (L)  13.0 - 17.0 g/dL   HCT 36.7 (L) 39.0 - 52.0 %   MCV 86.4 78.0 - 100.0 fL   MCH 27.8 26.0 - 34.0 pg   MCHC 32.2 30.0 - 36.0 g/dL   RDW 17.0 (H) 11.5 - 15.5 %   Platelets 289 150 - 400 K/uL    Imaging / Studies: No results found.  Medications / Allergies: per chart  Antibiotics: Anti-infectives    Start     Dose/Rate Route Frequency Ordered Stop   08/25/14 2000  cefoTEtan (CEFOTAN) 2 g in dextrose 5 % 50 mL IVPB     2 g100 mL/hr over 30 Minutes Intravenous Every 12 hours 08/25/14 1227 08/25/14 2015   08/25/14 0532  cefoTEtan (CEFOTAN) 2 g in dextrose 5 % 50 mL IVPB     2 g100 mL/hr over 30 Minutes Intravenous On call to O.R. 08/25/14 0532 08/25/14 0744       Note: Portions of this report may have been transcribed using voice recognition software. Every effort was made to ensure accuracy; however, inadvertent computerized transcription errors may be present.   Any transcriptional errors that result from this process are unintentional.

## 2014-08-26 NOTE — Consult Note (Signed)
WOC ostomy consult note Stoma type/location: LLQ Colostomy Stomal assessment/size: 1 and 1/2 inch round, slightly oval at rest.  Os at center. Slightly budded Peristomal assessment: intact, clear. Treatment options for stomal/peristomal skin: None Output None Ostomy pouching: 2pc., 2 and 1/4 inch pouching system  Education provided: Lengthy discussion with patient regarding colostomy and differences between colostomy and ileostomy.  Two areas of hypergranulation noted at 3 o'clock.  I have ordered silver nitrate sticks and will chemically cauterize those tomorrow. Patient's parents are visiting with him at the time of my assessment and session; I will see again tomorrow to meet with wife, who works in the afternoon. [place] nursing team will follow, andl remain available to this patient, the nursing and medical team.  Thanks, Maudie Flakes, MSN, RN, [place], [place], [place] [phone])

## 2014-08-27 MED ORDER — SILVER NITRATE-POT NITRATE 75-25 % EX MISC
10.0000 | Freq: Once | CUTANEOUS | Status: AC
  Administered 2014-08-27: 10 via TOPICAL
  Filled 2014-08-27: qty 10

## 2014-08-27 MED ORDER — LACTATED RINGERS IV BOLUS (SEPSIS): 1000.0000 mL | Freq: Three times a day (TID) | INTRAVENOUS | Status: DC | PRN

## 2014-08-27 MED ORDER — SALINE SPRAY 0.65 % NA SOLN
1.0000 | NASAL | Status: DC | PRN
  Administered 2014-08-27: 1 via NASAL
  Filled 2014-08-27: qty 44

## 2014-08-27 MED ORDER — ADULT MULTIVITAMIN W/MINERALS CH
1.0000 | ORAL_TABLET | Freq: Every day | ORAL | Status: DC
  Administered 2014-08-27 – 2014-08-28 (×2): 1 via ORAL
  Filled 2014-08-27 (×2): qty 1

## 2014-08-27 MED ORDER — OXYCODONE HCL 5 MG PO TABS
5.0000 mg | ORAL_TABLET | ORAL | Status: DC | PRN
Start: 2014-08-27 — End: 2014-08-28
  Administered 2014-08-27 – 2014-08-28 (×3): 10 mg via ORAL
  Filled 2014-08-27 (×3): qty 2

## 2014-08-27 NOTE — Consult Note (Signed)
WOC ostomy follow up Stoma type/location: LLQ Colostomy Stomal assessment/size: 1 and 1/2 inches round, moist. OS at center. Small ara of hypergranulation at 3 o'clock. Peristomal assessment: intact Treatment options for stomal/peristomal skin: I treated the hypergranulation with silver nitrate sticks today. I demonstrated this to patient and wife. Output brown stool with bloody content Ostomy pouching: 2pc. 2 and 1/4 inch pouching system Education provided: patient and wife instructed on showering, other ADLs and most importantly, on how to obtain supplies, advocate for themselves when suppliers try to change them into generic pouching supplies, etc. Both express understanding and appreciation. Enrolled patient in [place] Start Discharge program: Yes [place] nursing team will follow, and will remain available to this patient, the nursing and medical team.  Thanks, Maudie Flakes, MSN, RN, [place], [place], [place] [phone])

## 2014-08-27 NOTE — Progress Notes (Signed)
[address] [place], [place] [address] Phone: [phone] FAX: [place] [phone] 1956-06-19  CARE TEAM:  PCP: Rubbie Battiest, MD  Outpatient Care Team: Patient Care Team: [name], MD as PCP - General (Family Medicine) Satira Sark, MD as Consulting Physician (Cardiology) Danie Binder, MD as Consulting Physician (Gastroenterology) Milus Banister, MD as Attending Physician (Gastroenterology) Farrel Gobble, MD as Consulting Physician (Medical Oncology) Michael Boston, MD as Consulting Physician (General Surgery) Reece Packer, MD as Consulting Physician (Urology) Danie Binder, MD as Consulting Physician (Gastroenterology)  Inpatient Treatment Team: Treatment Team: Attending Provider: Michael Boston, MD; Technician: Val Riles, NT; Technician: Leda Quail, NT; Registered Nurse: Rise Patience, RN; Technician: Arlean Hopping, NT; Registered Nurse: Emeterio Reeve, RN   Subjective:  No events Tol PO Walked in hallways Pain controlled Feeling better  Objective:  Vital signs:  Filed Vitals:   08/26/14 1400 08/26/14 2120 08/27/14 0506 08/27/14 0552  BP: 127/73 134/84 143/92 136/90  Pulse: 87 88 82 77  Temp: 98.7 F (37.1 C) 99 F (37.2 C) 99.1 F (37.3 C)   TempSrc: Oral Oral Oral   Resp: '18 17 18   ' Height:      Weight:      SpO2: 96% 92% 93%        Intake/Output   Yesterday:  01/06 0701 - 01/07 0700 In: 547.5 [P.O.:360; I.V.:187.5] Out: 2375 [Urine:2225; Stool:150] This shift:     Bowel function:  Flatus: n  BM: n  Drain: n/a  Physical Exam:  General: Pt awake/alert/oriented x4 in no acute distress Eyes: PERRL, normal EOM.  Sclera clear.  No icterus Neuro: CN II-XII intact w/o focal sensory/motor deficits. Lymph: No head/neck/groin lymphadenopathy Psych:  No delerium/psychosis/paranoia.  Calm, smiling HENT: Normocephalic,  Mucus membranes moist.  No thrush Neck: Supple, No tracheal deviation Chest: No chest wall pain w good excursion CV:  Pulses intact.  Regular rhythm MS: Normal AROM mjr joints.  No obvious deformity Abdomen: Soft.  Nondistended.  Mildly tender at old ileostomy incision RLQ only.  No evidence of peritonitis.  No incarcerated hernias. Ext:  SCDs BLE.  No mjr edema.  No cyanosis Skin: No petechiae / purpura   Problem List:   Principal Problem:   Rectal cancer, uT2uN0, s/p LAR/ileostomy 03/20/2014 colostomy [id number] Active Problems:   GERD (gastroesophageal reflux disease)   Obesity (BMI 30-39.9)   Essential hypertension, benign   Colostomy in place   Assessment  [name]  59 y.o. male  2 Days Post-Op  Procedure(s): TAKEDOWN OF LOOP ILEOSTOMY  EXAM UNDER ANESTHESIA  Recovering   Plan:  -adv diet -foley d/c'd -wean IVF -transition to PO meds gradually -HTN controlled -PPI for GERD -VTE prophylaxis- SCDs, etc -mobilize as tolerated to help recovery.  PT cleared as doing well from last PT/rehab  I updated the patient.  Recommendations were made.  Questions were answered.  The patient expressed understanding & appreciation.    Adin Hector, M.D., F.A.C.S. Gastrointestinal and Minimally Invasive Surgery [facility] [address] [place],  [address] [phone] Main / Paging   08/27/2014   Results:   Labs: Results for orders placed or performed during the hospital encounter of 08/25/14 (from the past 48 hour(s))  Glucose, capillary     Status: Abnormal   Collection Time: 08/25/14 11:44 PM  Result Value Ref Range  Glucose-Capillary 128 (H) 70 - 99 mg/dL  Basic metabolic panel     Status: Abnormal   Collection Time: 08/26/14  5:00 AM  Result Value Ref Range   Sodium 137 135 - 145 mmol/L    Comment: Please note change in reference range.   Potassium 4.1 3.5 - 5.1 mmol/L    Comment: Please note change in  reference range.   Chloride 103 96 - 112 mEq/L   CO2 27 19 - 32 mmol/L   Glucose, Bld 104 (H) 70 - 99 mg/dL   BUN 21 6 - 23 mg/dL   Creatinine, Ser 0.92 0.50 - 1.35 mg/dL   Calcium 8.2 (L) 8.4 - 10.5 mg/dL   GFR calc non Af Amer >90 >90 mL/min   GFR calc Af Amer >90 >90 mL/min    Comment: (NOTE) The eGFR has been calculated using the CKD EPI equation. This calculation has not been validated in all clinical situations. eGFR's persistently <90 mL/min signify possible Chronic Kidney Disease.    Anion gap 7 5 - 15  CBC     Status: Abnormal   Collection Time: 08/26/14  5:00 AM  Result Value Ref Range   WBC 10.6 (H) 4.0 - 10.5 K/uL   RBC 4.25 4.22 - 5.81 MIL/uL   Hemoglobin 11.8 (L) 13.0 - 17.0 g/dL   HCT 36.7 (L) 39.0 - 52.0 %   MCV 86.4 78.0 - 100.0 fL   MCH 27.8 26.0 - 34.0 pg   MCHC 32.2 30.0 - 36.0 g/dL   RDW 17.0 (H) 11.5 - 15.5 %   Platelets 289 150 - 400 K/uL  Magnesium     Status: None   Collection Time: 08/26/14  5:00 AM  Result Value Ref Range   Magnesium 1.9 1.5 - 2.5 mg/dL    Imaging / Studies: No results found.  Medications / Allergies: per chart  Antibiotics: Anti-infectives    Start     Dose/Rate Route Frequency Ordered Stop   08/25/14 2000  cefoTEtan (CEFOTAN) 2 g in dextrose 5 % 50 mL IVPB     2 g100 mL/hr over 30 Minutes Intravenous Every 12 hours 08/25/14 1227 08/25/14 2015   08/25/14 0532  cefoTEtan (CEFOTAN) 2 g in dextrose 5 % 50 mL IVPB     2 g100 mL/hr over 30 Minutes Intravenous On call to O.R. 08/25/14 0532 08/25/14 0744       Note: Portions of this report may have been transcribed using voice recognition software. Every effort was made to ensure accuracy; however, inadvertent computerized transcription errors may be present.   Any transcriptional errors that result from this process are unintentional.

## 2014-08-28 NOTE — Discharge Instructions (Signed)
Ostomy Support Information  Yes, Theresia Majors heard that people get along just fine with only one of their eyes, or one of their lungs, or one of their kidneys. But you also know that you have only one intestine and only one bladder, and that leaves you feeling awfully empty, both physically and emotionally: You think no other people go around without part of their intestine with the ends of their intestines sticking out through their abdominal walls.  Well, you are wrong! There are nearly three quarters of a million people in the [place] who have an ostomy; people who have had surgery to remove all or part of their colons or bladders. There is even a national association, the [place] [employer] [place] with over 350 local affiliated support groups that are organized by volunteers who provide peer support and counseling. [name] has a toll free telephone num-ber, ([phone] and an educational,  interactive website, www.ostomy.org   An ostomy is an opening in the belly (abdominal wall) made by surgery. Ostomates are people who have had this procedure. The opening (stoma) allows the kidney or bowel to discharge waste. An external pouch covers the stoma to collect waste. Pouches are are a simple bag and are odor free. Different companies have disposable or reusable pouches to fit one's lifestyle. An ostomy can either be temporary or permanent.  THERE ARE THREE MAIN TYPES OF OSTOMIES  Colostomy. A colostomy is a surgically created opening in the large intestine (colon).  Ileostomy. An ileostomy is a surgically created opening in the small intestine.  Urostomy. A urostomy is a surgically created opening to divert urine away from the bladder. FREQUENTLY ASKED QUESTIONS   Why havent you met any of these folks who have an ostomy?  Well, maybe you have! You just did not recognize them because an ostomy doesn't show. It can be kept secret if you wish. Why, maybe some of your best friends, office associates  or neighbors have an ostomy ... you never can tell.   People facing ostomy surgery have many quality-of-life questions like:  Will you bulge? Smell? Make noises? Will you feel waste leaving your body? Will you be a captive of the toilet? Will you starve? Be a social outcast? Get/stay married? Have babies? Easily bathe, go swimming, bend over?  OK, lets look at what you can expect:  Will you bulge?  Remember, without part of the intestine or bladder, and its contents, you should have a flatter tummy than before. You can expect to wear, with little exception, what you wore before surgery ... and this in-cludes tight clothing and bathing suits.  Will you smell?  Today, thanks to modern odor proof pouching systems, you can walk into an ostomy support group meeting and not smell anything that is foul or offensive. And, for those with an ileostomy or colostomy who are concerned about odor when emptying their pouch, there are in-pouch deodorants that can be used to eliminate any waste odors that may exist.  Will you make noises?  Everyone produces gas, especially if they are an air-swallower. But intestinal sounds that occur from time to time are no differ-ent than a gurgling tummy, and quite often your clothing will muffle any sounds.   Will you feel the waste discharges?  For those with a colostomy or ileostomy there might be a slight pressure when waste leaves your body, but understand that the intestines have no nerve endings, so there will be no unpleasant sensations. Those with a urostomy will  probably be unaware of any kidney drainage.  Will you be a captive of the toilet?  Immediately post-op you will spend more time in the bathroom than you will after your body recovers from surgery. Every person is different, but on average those with an ileostomy or urostomy may empty their pouches 4 to 6 times a day; a little  less if you have a colostomy. The average wear time between pouch system changes is 3  to 5 days and the changing process should take less than 30 minutes.  Will I need to be on a special diet? Most people return to their normal diet when they have recovered from surgery. Be sure to chew your food well, eat a well-balanced diet and drink plenty of fluids. If you experience problems with a certain food, wait a couple of weeks and try it again. Will there be odor and noises? Pouching systems are designed to be odor-proof or odor-resistant. There are deodorants that can be used in the pouch. Medications are also available to help reduce odor. Limit gas-producing foods and carbonated beverages. You will experience less gas and fewer noises as you heal from surgery. How much time will it take to care for my ostomy? At first, you may spend a lot of time learning about your ostomy and how to take care of it. As you become more comfortable and skilled at changing the pouching system, it will take very little time to care for it.  Will I be able to return to work? People with ostomies can perform most jobs. As soon as you have healed from surgery, you should be able to return to work. Heavy lifting (more than 10 pounds) may be discouraged.  What about intimacy? Sexual relationships and intimacy are important and fulfilling aspects of your life. They should continue after ostomy surgery. Intimacy-related concerns should be discussed openly between you and your partner.  Can I wear regular clothing? You do not need to wear special clothing. Ostomy pouches are fairly flat and barely noticeable. Elastic undergarments will not hurt the stoma or prevent the ostomy from functioning.  Can I participate in sports? An ostomy should not limit your involvement in sports. Many people with ostomies are runners, skiers, swimmers or participate in other active lifestyles. Talk with your caregiver first before doing heavy physical activity.  Will you starve?  Not if you follow doctors orders at each stage of  your post-op adjustment. There is no such thing as an ostomy diet. Some people with an ostomy will be able to eat and tolerate anything; others may find diffi-culty with some foods. Each person is an individual and must determine, by trial, what is best for them. A good practice for all is to drink plenty of water.  Will you be a social outcast?  Have you met anyone who has an ostomy and is a social outcast? Why should you be the first? Only your attitude and self image will effect how you are treated. No confi-dent person is an Occupational psychologist.   PROFESSIONAL HELP  Resources are available if you need help or have questions about your ostomy.    Specially trained nurses called Wound, Ostomy Continence Nurses (WOCN) are available for consultation in most major medical centers.   Consider getting an ostomy consult with [name] at [facility] to help troubleshoot stoma pouch fittings and other issues with your ostomy: [phone]   The [place] (UOA) is a group made up of many  local chapters throughout the [place]. These local groups hold meetings and provide support to prospective and existing ostomates. They sponsor educational events and have qualified visitors to make personal or telephone visits. Contact the UOA for the chapter nearest you and for other educational publications.  More detailed information can be found in Colostomy Guide, a publication of the [employer] (UOA). Contact UOA at 1-([phone] or visit their web site at [email or web address] The website contains links to other sites, suppliers and resources. Document Released: 08/10/2003 Document Revised: 10/30/2011 Document Reviewed: 12/09/2008 [facility] Patient Information 2013 [place].  ABDOMINAL SURGERY: POST OP INSTRUCTIONS  1. DIET: Follow a light bland diet the first 24 hours after arrival home, such as soup, liquids, crackers, etc.  Be sure to include lots of fluids daily.   Avoid fast food or heavy meals as your are more likely to get nauseated.  Eat a low fat the next few days after surgery.   2. Take your usually prescribed home medications unless otherwise directed. 3. PAIN CONTROL: a. Pain is best controlled by a usual combination of three different methods TOGETHER: i. Ice/Heat ii. Over the counter pain medication iii. Prescription pain medication b. Most patients will experience some swelling and bruising around the incisions.  Ice packs or heating pads (30-60 minutes up to 6 times a day) will help. Use ice for the first few days to help decrease swelling and bruising, then switch to heat to help relax tight/sore spots and speed recovery.  Some people prefer to use ice alone, heat alone, alternating between ice & heat.  Experiment to what works for you.  Swelling and bruising can take several weeks to resolve.   c. It is helpful to take an over-the-counter pain medication regularly for the first few weeks.  Choose one of the following that works best for you: i. Naproxen (Aleve, etc)  Two 228m tabs twice a day ii. Ibuprofen (Advil, etc) Three 2048mtabs four times a day (every meal & bedtime) iii. Acetaminophen (Tylenol, etc) 500-65036mour times a day (every meal & bedtime) d. A  prescription for pain medication (such as oxycodone, hydrocodone, etc) should be given to you upon discharge.  Take your pain medication as prescribed.  i. If you are having problems/concerns with the prescription medicine (does not control pain, nausea, vomiting, rash, itching, etc), please call us [phone] see if we need to switch you to a different pain medicine that will work better for you and/or control your side effect better. ii. If you need a refill on your pain medication, please contact your pharmacy.  They will contact our office to request authorization. Prescriptions will not be filled after 5 pm or on week-ends. 4. Avoid getting constipated.  Between the surgery  and the pain medications, it is common to experience some constipation.  Increasing fluid intake and taking a fiber supplement (such as Metamucil, Citrucel, FiberCon, MiraLax, etc) 1-2 times a day regularly will usually help prevent this problem from occurring.  A mild laxative (prune juice, Milk of Magnesia, MiraLax, etc) should be taken according to package directions if there are no bowel movements after 48 hours.   5. Watch out for diarrhea.  If you have many loose bowel movements, simplify your diet to bland foods & liquids for a few days.  Stop any stool softeners and decrease your fiber supplement.  Switching to mild anti-diarrheal medications (Kayopectate, Pepto Bismol) can help.  If this worsens or does not improve, please  call us. 6. Wash / shower every day.  You may shower over the incision / wound.  Avoid baths until the skin is fully healed.  Continue to shower over incision(s) after the dressing is off. 7. Remove your waterproof bandages 5 days after surgery.  You may leave the incision open to air.  Remove any wicks or ribbons in your wound.  If you have an open wound, please see wound care instructions. You may replace a dressing/Band-Aid to cover the incision for comfort if you wish. 8. ACTIVITIES as tolerated:   a. You may resume regular (light) daily activities beginning the next day--such as daily self-care, walking, climbing stairs--gradually increasing activities as tolerated.  If you can walk 30 minutes without difficulty, it is safe to try more intense activity such as jogging, treadmill, bicycling, low-impact aerobics, swimming, etc. b. Save the most intensive and strenuous activity for last such as sit-ups, heavy lifting, contact sports, etc  Refrain from any heavy lifting or straining until you are off narcotics for pain control.   c. DO NOT PUSH THROUGH PAIN.  Let pain be your guide: If it hurts to do something, don't do it.  Pain is your body warning you to avoid that activity for  another week until the pain goes down. d. You may drive when you are no longer taking prescription pain medication, you can comfortably wear a seatbelt, and you can safely maneuver your car and apply brakes. e. Dennis Bast may have sexual intercourse when it is comfortable.  9. FOLLOW UP in our office a. Please call [facility] at [phone] to set up an appointment to see your surgeon in the office for a follow-up appointment approximately 1-2 weeks after your surgery. b. Make sure that you call for this appointment the day you arrive home to insure a convenient appointment time. 10. IF YOU HAVE DISABILITY OR FAMILY LEAVE FORMS, BRING THEM TO THE OFFICE FOR PROCESSING.  DO NOT GIVE THEM TO YOUR DOCTOR.   WHEN TO CALL us [phone]: 1. Poor pain control 2. Reactions / problems with new medications (rash/itching, nausea, etc)  3. Fever over 101.5 F (38.5 C) 4. Inability to urinate 5. Nausea and/or vomiting 6. Worsening swelling or bruising 7. Continued bleeding from incision. 8. Increased pain, redness, or drainage from the incision  The clinic staff is available to answer your questions during regular business hours (8:30am-5pm).  Please dont hesitate to call and ask to speak to one of our nurses for clinical concerns.   A surgeon from [facility] is always on call at the hospitals   If you have a medical emergency, go to the nearest emergency room or call 911.    [facility], [address], [place], [place], [place]  [address] ? MAIN: [phone] ? TOLL FREE: [phone] ? FAX [phone] [email or web address]  Managing Pain  Pain after surgery or related to activity is often due to strain/injury to muscle, tendon, nerves and/or incisions.  This pain is usually short-term and will improve in a few months.   Many people find it helpful to do the following things TOGETHER to help speed the process of healing and to get back to regular  activity more quickly:  1. Avoid heavy physical activity a.  no lifting greater than 20 pounds b. Do not push through the pain.  Listen to your body and avoid positions and maneuvers than reproduce the pain c. Walking is okay as tolerated, but go  slowly and stop when getting sore.  d. Remember: If it hurts to do it, then dont do it! 2. Take Anti-inflammatory medication  a. Take with food/snack around the clock for 1-2 weeks i. This helps the muscle and nerve tissues become less irritable and calm down faster b. Choose ONE of the following over-the-counter medications: i. Naproxen 220mg  tabs (ex. Aleve) 1-2 pills twice a day  ii. Ibuprofen 200mg  tabs (ex. Advil, Motrin) 3-4 pills with every meal and just before bedtime iii. Acetaminophen 500mg  tabs (Tylenol) 1-2 pills with every meal and just before bedtime 3. Use a Heating pad or Ice/Cold Pack a. 4-6 times a day b. May use warm bath/hottub  or showers 4. Try Gentle Massage and/or Stretching  a. at the area of pain many times a day b. stop if you feel pain - do not overdo it  Try these steps together to help you body heal faster and avoid making things get worse.  Doing just one of these things may not be enough.    If you are not getting better after two weeks or are noticing you are getting worse, contact our office for further advice; we may need to re-evaluate you & see what other things we can do to help.  GETTING TO GOOD BOWEL HEALTH. Irregular bowel habits such as constipation and diarrhea can lead to many problems over time.  Having one soft bowel movement a day is the most important way to prevent further problems.  The anorectal canal is designed to handle stretching and feces to safely manage our ability to get rid of solid waste (feces, poop, stool) out of our body.  BUT, hard constipated stools can act like ripping concrete bricks and diarrhea can be a burning fire to this very sensitive area of our body, causing inflamed  hemorrhoids, anal fissures, increasing risk is perirectal abscesses, abdominal pain/bloating, an making irritable bowel worse.     The goal: ONE SOFT BOWEL MOVEMENT A DAY!  To have soft, regular bowel movements:   Drink at least 8 tall glasses of water a day.    Take plenty of fiber.  Fiber is the undigested part of plant food that passes into the colon, acting s natures broom to encourage bowel motility and movement.  Fiber can absorb and hold large amounts of water. This results in a larger, bulkier stool, which is soft and easier to pass. Work gradually over several weeks up to 6 servings a day of fiber (25g a day even more if needed) in the form of: o Vegetables -- Root (potatoes, carrots, turnips), leafy green (lettuce, salad greens, celery, spinach), or cooked high residue (cabbage, broccoli, etc) o Fruit -- Fresh (unpeeled skin & pulp), Dried (prunes, apricots, cherries, etc ),  or stewed ( applesauce)  o Whole grain breads, pasta, etc (whole wheat)  o Bran cereals   Bulking Agents -- This type of water-retaining fiber generally is easily obtained each day by one of the following:  o Psyllium bran -- The psyllium plant is remarkable because its ground seeds can retain so much water. This product is available as Metamucil, Konsyl, Effersyllium, Per Diem Fiber, or the less expensive generic preparation in drug and health food stores. Although labeled a laxative, it really is not a laxative.  o Methylcellulose -- This is another fiber derived from wood which also retains water. It is available as Citrucel. o Polyethylene Glycol - and artificial fiber commonly called Miralax or Glycolax.  It is helpful  for people with gassy or bloated feelings with regular fiber o Flax Seed - a less gassy fiber than psyllium  No reading or other relaxing activity while on the toilet. If bowel movements take longer than 5 minutes, you are too constipated  AVOID CONSTIPATION.  High fiber and water intake  usually takes care of this.  Sometimes a laxative is needed to stimulate more frequent bowel movements, but   Laxatives are not a good long-term solution as it can wear the colon out. o Osmotics (Milk of Magnesia, Fleets phosphosoda, Magnesium citrate, MiraLax, GoLytely) are safer than  o Stimulants (Senokot, Castor Oil, Dulcolax, Ex Lax)    o Do not take laxatives for more than 7days in a row.   IF SEVERELY CONSTIPATED, try a Bowel Retraining Program: o Do not use laxatives.  o Eat a diet high in roughage, such as bran cereals and leafy vegetables.  o Drink six (6) ounces of prune or apricot juice each morning.  o Eat two (2) large servings of stewed fruit each day.  o Take one (1) heaping tablespoon of a psyllium-based bulking agent twice a day. Use sugar-free sweetener when possible to avoid excessive calories.  o Eat a normal breakfast.  o Set aside 15 minutes after breakfast to sit on the toilet, but do not strain to have a bowel movement.  o If you do not have a bowel movement by the third day, use an enema and repeat the above steps.   Controlling diarrhea o Switch to liquids and simpler foods for a few days to avoid stressing your intestines further. o Avoid dairy products (especially milk & ice cream) for a short time.  The intestines often can lose the ability to digest lactose when stressed. o Avoid foods that cause gassiness or bloating.  Typical foods include beans and other legumes, cabbage, broccoli, and dairy foods.  Every person has some sensitivity to other foods, so listen to our body and avoid those foods that trigger problems for you. o Adding fiber (Citrucel, Metamucil, psyllium, Miralax) gradually can help thicken stools by absorbing excess fluid and retrain the intestines to act more normally.  Slowly increase the dose over a few weeks.  Too much fiber too soon can backfire and cause cramping & bloating. o Probiotics (such as active yogurt, Align, etc) may help  repopulate the intestines and colon with normal bacteria and calm down a sensitive digestive tract.  Most studies show it to be of mild help, though, and such products can be costly. o Medicines: - Bismuth subsalicylate (ex. Kayopectate, Pepto Bismol) every 30 minutes for up to 6 doses can help control diarrhea.  Avoid if pregnant. - Loperamide (Immodium) can slow down diarrhea.  Start with two tablets ($RemoveBefo'4mg'lDgcweWcZTl$  total) first and then try one tablet every 6 hours.  Avoid if you are having fevers or severe pain.  If you are not better or start feeling worse, stop all medicines and call your doctor for advice o Call your doctor if you are getting worse or not better.  Sometimes further testing (cultures, endoscopy, X-ray studies, bloodwork, etc) may be needed to help diagnose and treat the cause of the diarrhea. o

## 2014-08-28 NOTE — Discharge Summary (Signed)
Physician Discharge Summary  Patient ID: [name] MRN: [id number] DOB/AGE: 1956-06-04 59 y.o.  Admit date: 08/25/2014 Discharge date: 08/28/2014  Patient Care Team: [name], MD as PCP - General (Family Medicine) Satira Sark, MD as Consulting Physician (Cardiology) Danie Binder, MD as Consulting Physician (Gastroenterology) Milus Banister, MD as Attending Physician (Gastroenterology) Farrel Gobble, MD as Consulting Physician (Medical Oncology) Michael Boston, MD as Consulting Physician (General Surgery) Reece Packer, MD as Consulting Physician (Urology) Danie Binder, MD as Consulting Physician (Gastroenterology)  Admission Diagnoses: Principal Problem:   Rectal cancer, uT2uN0, s/p LAR/ileostomy 03/20/2014 colostomy [id number] Active Problems:   GERD (gastroesophageal reflux disease)   Obesity (BMI 30-39.9)   Essential hypertension, benign   Colostomy in place   Discharge Diagnoses:  Principal Problem:   Rectal cancer, uT2uN0, s/p LAR/ileostomy 03/20/2014 colostomy [id number] Active Problems:   GERD (gastroesophageal reflux disease)   Obesity (BMI 30-39.9)   Essential hypertension, benign   Colostomy in place   POST-OPERATIVE DIAGNOSIS:  Loop Ileostomy in place  SURGERY:  Procedure(s): TAKEDOWN OF LOOP ILEOSTOMY  EXAM UNDER ANESTHESIA  SURGEON:  Surgeon(s): Michael Boston, MD  Consults: None  Hospital Course:   The patient underwent the surgery above.  Postoperatively, the patient gradually mobilized and advanced to a solid diet.  Pain and other symptoms were treated aggressively.    By the time of discharge, the patient was walking well the hallways with occasional walker use.  PT saw & cleared the patient.  A walker was ordered for home.  The patient was eating food, having flatus & BMs in bag.  WOCN felt pt managing the colostomy well.  D/w pt & his wife.  Pain was well-controlled on an oral medications.  Based on meeting discharge criteria  and continuing to recover, I felt it was safe for the patient to be discharged from the hospital to further recover with close followup. Postoperative recommendations were discussed in detail.  They are written as well.   Significant Diagnostic Studies:  Results for orders placed or performed during the hospital encounter of 08/25/14 (from the past 72 hour(s))  Glucose, capillary     Status: Abnormal   Collection Time: 08/25/14 11:44 PM  Result Value Ref Range   Glucose-Capillary 128 (H) 70 - 99 mg/dL  Basic metabolic panel     Status: Abnormal   Collection Time: 08/26/14  5:00 AM  Result Value Ref Range   Sodium 137 135 - 145 mmol/L    Comment: Please note change in reference range.   Potassium 4.1 3.5 - 5.1 mmol/L    Comment: Please note change in reference range.   Chloride 103 96 - 112 mEq/L   CO2 27 19 - 32 mmol/L   Glucose, Bld 104 (H) 70 - 99 mg/dL   BUN 21 6 - 23 mg/dL   Creatinine, Ser 0.92 0.50 - 1.35 mg/dL   Calcium 8.2 (L) 8.4 - 10.5 mg/dL   GFR calc non Af Amer >90 >90 mL/min   GFR calc Af Amer >90 >90 mL/min    Comment: (NOTE) The eGFR has been calculated using the CKD EPI equation. This calculation has not been validated in all clinical situations. eGFR's persistently <90 mL/min signify possible Chronic Kidney Disease.    Anion gap 7 5 - 15  CBC     Status: Abnormal   Collection Time: 08/26/14  5:00 AM  Result Value Ref Range   WBC 10.6 (H) 4.0 - 10.5 K/uL  RBC 4.25 4.22 - 5.81 MIL/uL   Hemoglobin 11.8 (L) 13.0 - 17.0 g/dL   HCT 36.7 (L) 39.0 - 52.0 %   MCV 86.4 78.0 - 100.0 fL   MCH 27.8 26.0 - 34.0 pg   MCHC 32.2 30.0 - 36.0 g/dL   RDW 17.0 (H) 11.5 - 15.5 %   Platelets 289 150 - 400 K/uL  Magnesium     Status: None   Collection Time: 08/26/14  5:00 AM  Result Value Ref Range   Magnesium 1.9 1.5 - 2.5 mg/dL    No results found.  Discharge Exam: Blood pressure 129/94, pulse 85, temperature 98.2 F (36.8 C), temperature source Oral, resp. rate 20,  height '5\' 5"'  (1.651 m), weight 182 lb 1.6 oz (82.6 kg), SpO2 94 %.  General: Pt awake/alert/oriented x4 in no major acute distress Eyes: PERRL, normal EOM. Sclera nonicteric Neuro: CN II-XII intact w/o focal sensory/motor deficits. Lymph: No head/neck/groin lymphadenopathy Psych:  No delerium/psychosis/paranoia HENT: Normocephalic, Mucus membranes moist.  No thrush Neck: Supple, No tracheal deviation Chest: No pain.  Good respiratory excursion. CV:  Pulses intact.  Regular rhythm MS: Normal AROM mjr joints.  No obvious deformity Abdomen: Soft, Nondistended.  Old RLQ incision clean w normal healing idge.  Colostomy LLQ w flatus/stool.  Min tender at RLQ incision only.  No incarcerated hernias. Ext:  SCDs BLE.  No significant edema.  No cyanosis Skin: No petechiae / purpura  Discharged Condition: good   Past Medical History  Diagnosis Date  . GERD (gastroesophageal reflux disease)   . Arthritis   . Aspiration pneumonia 03/24/2014  . Hypertension   . Difficulty urinating   . Anxiety   . Colostomy in place   . Ileostomy present   . Shortening, leg, congenital     LEFT  . Rectal cancer, uT2uN0, s/p LAR/ileostomy 03/20/2014 colostomy Aug2015 12/25/2013    TCS MAY 2015     Past Surgical History  Procedure Laterality Date  . Colonoscopy N/A 12/22/2013    Procedure: COLONOSCOPY;  Surgeon: Danie Binder, MD;  Location: [facility];  Service: Endoscopy;  Laterality: N/A;  8:30  . Esophagogastroduodenoscopy N/A 12/22/2013    Procedure: ESOPHAGOGASTRODUODENOSCOPY (EGD);  Surgeon: Danie Binder, MD;  Location: [facility];  Service: Endoscopy;  Laterality: N/A;  . Eus N/A 01/01/2014    Procedure: LOWER ENDOSCOPIC ULTRASOUND (EUS);  Surgeon: Milus Banister, MD;  Location: [facility];  Service: Endoscopy;  Laterality: N/A;  . Hip fracture surgery Left age 49 on 04-06-1970  . Eye reattachment and ear reattachment  Right 1971  . Colon resection N/A 03/24/2014    Procedure: Diagnostic   laparotomy, takedown of coloanal anastamosis, end colostomy, with wound vac;  Surgeon: Adin Hector, MD;  Location: [facility];  Service: General;  Laterality: N/A;  . Flexible sigmoidoscopy N/A 03/24/2014    Procedure: FLEXIBLE SIGMOIDOSCOPY;  Surgeon: Adin Hector, MD;  Location: [facility];  Service: General;  Laterality: N/A;  . Ileostomy    . Colostomy    . Ileostomy closure N/A 08/25/2014    Procedure: TAKEDOWN OF LOOP ILEOSTOMY ;  Surgeon: Michael Boston, MD;  Location: [facility];  Service: General;  Laterality: N/A;  . Examination under anesthesia  08/25/2014    Procedure: Jasmine December UNDER ANESTHESIA;  Surgeon: Michael Boston, MD;  Location: [facility];  Service: General;;    History   Social History  . Marital Status: Married    Spouse Name: N/A    Number of  Children: N/A  . Years of Education: N/A   Occupational History  . Not on file.   Social History Main Topics  . Smoking status: Former Smoker -- 0.00 packs/day for 15 years    Types: Cigars    Quit date: 09/10/2011  . Smokeless tobacco: Never Used  . Alcohol Use: No     Comment: quit years ago  . Drug Use: No  . Sexual Activity: Not on file   Other Topics Concern  . 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    Family History  Problem Relation Age of Onset  . Colon polyps Neg Hx   . Colon cancer Sister     Current Facility-Administered Medications  Medication Dose Route Frequency Provider Last Rate Last Dose  . 0.9 %  sodium chloride infusion  250 mL Intravenous PRN Michael Boston, MD      . acetaminophen (TYLENOL) tablet 1,000 mg  1,000 mg Oral TID Michael Boston, MD   1,000 mg at 08/27/14 2203  . alum & mag hydroxide-simeth (MAALOX/MYLANTA) 200-200-20 MG/5ML suspension 30 mL  30 mL Oral Q6H PRN Michael Boston, MD      . alvimopan (ENTEREG) capsule 12 mg  12 mg Oral BID Michael Boston, MD   12 mg at 08/27/14 2203  . aspirin EC tablet 81 mg  81 mg Oral Daily Michael Boston, MD   81 mg at 08/27/14 3614   . diphenhydrAMINE (BENADRYL) 12.5 MG/5ML elixir 12.5 mg  12.5 mg Oral Q6H PRN Michael Boston, MD       Or  . diphenhydrAMINE (BENADRYL) injection 12.5 mg  12.5 mg Intravenous Q6H PRN Michael Boston, MD      . heparin injection 5,000 Units  5,000 Units Subcutaneous 3 times per day Michael Boston, MD   5,000 Units at 08/28/14 0547  . HYDROmorphone (DILAUDID) injection 0.5-2 mg  0.5-2 mg Intravenous Q1H PRN Michael Boston, MD   1 mg at 08/27/14 0500  . lactated ringers bolus 1,000 mL  1,000 mL Intravenous Q8H PRN Michael Boston, MD      . lip balm (CARMEX) ointment 1 application  1 application Topical BID Michael Boston, MD   1 application at 43/15/40 [phone]  . loratadine (CLARITIN) tablet 10 mg  10 mg Oral Daily Michael Boston, MD   10 mg at 08/27/14 6195  . LORazepam (ATIVAN) tablet 0.5 mg  0.5 mg Oral BID PRN Michael Boston, MD      . magic mouthwash  15 mL Oral QID PRN Michael Boston, MD      . menthol-cetylpyridinium (CEPACOL) lozenge 3 mg  1 lozenge Oral PRN Michael Boston, MD      . metoprolol (LOPRESSOR) injection 5 mg  5 mg Intravenous 4 times per day Michael Boston, MD   5 mg at 08/27/14 1441  . metoprolol tartrate (LOPRESSOR) tablet 12.5 mg  12.5 mg Oral BID Michael Boston, MD   12.5 mg at 08/28/14 0000  . multivitamin with minerals tablet 1 tablet  1 tablet Oral Daily Michael Boston, MD   1 tablet at 08/27/14 1002  . ondansetron (ZOFRAN) tablet 4 mg  4 mg Oral Q6H PRN Michael Boston, MD       Or  . ondansetron [facility]) injection 4 mg  4 mg Intravenous Q6H PRN Michael Boston, MD   4 mg at 08/26/14 0943  . oxyCODONE (Oxy IR/ROXICODONE) immediate release tablet 5-10 mg  5-10 mg Oral Q4H PRN Michael Boston,  MD   10 mg at 08/28/14 0554  . pantoprazole (PROTONIX) EC tablet 40 mg  40 mg Oral Daily Michael Boston, MD   40 mg at 08/27/14 6256  . phenol (CHLORASEPTIC) mouth spray 2 spray  2 spray Mouth/Throat PRN Michael Boston, MD      . promethazine (PHENERGAN) injection 6.25-12.5 mg  6.25-12.5 mg Intravenous Q4H PRN Michael Boston,  MD      . saccharomyces boulardii (FLORASTOR) capsule 250 mg  250 mg Oral BID Michael Boston, MD   250 mg at 08/27/14 2203  . sodium chloride (OCEAN) 0.65 % nasal spray 1 spray  1 spray Each Nare PRN Michael Boston, MD   1 spray at 08/27/14 2203  . sodium chloride 0.9 % injection 3 mL  3 mL Intravenous Q12H Michael Boston, MD   3 mL at 08/27/14 2200  . sodium chloride 0.9 % injection 3 mL  3 mL Intravenous PRN Michael Boston, MD      . tamsulosin [facility]) capsule 0.4 mg  0.4 mg Oral Daily Michael Boston, MD   0.4 mg at 08/27/14 3893  . vitamin C (ASCORBIC ACID) tablet 500 mg  500 mg Oral BID Michael Boston, MD   500 mg at 08/27/14 2203     No Known Allergies  Disposition: 01-Home or Self Care  Discharge Instructions    Call MD for:  extreme fatigue    Complete by:  As directed      Call MD for:  extreme fatigue    Complete by:  As directed      Call MD for:  hives    Complete by:  As directed      Call MD for:  hives    Complete by:  As directed      Call MD for:  persistant nausea and vomiting    Complete by:  As directed      Call MD for:  persistant nausea and vomiting    Complete by:  As directed      Call MD for:  redness, tenderness, or signs of infection (pain, swelling, redness, odor or green/yellow discharge around incision site)    Complete by:  As directed      Call MD for:  redness, tenderness, or signs of infection (pain, swelling, redness, odor or green/yellow discharge around incision site)    Complete by:  As directed      Call MD for:  severe uncontrolled pain    Complete by:  As directed      Call MD for:  severe uncontrolled pain    Complete by:  As directed      Call MD for:    Complete by:  As directed   Temperature > 101.59F     Call MD for:    Complete by:  As directed   Temperature > 101.59F     Diet - low sodium heart healthy    Complete by:  As directed      Discharge instructions    Complete by:  As directed   Please see discharge instruction sheets.  Also  refer to handout given an office.  Please call our office if you have any questions or concerns [phone]     Discharge instructions    Complete by:  As directed   Please see discharge instruction sheets.  Also refer to handout given an office.  Please call our office if you have any questions or concerns [phone]  Discharge wound care:    Complete by:  As directed   If you have closed incisions, shower and bathe over these incisions with soap and water every day.  Remove all surgical dressings on postoperative day #3.  You do not need to replace dressings over the closed incisions unless you feel more comfortable with a Band-Aid covering it.   If you have an open wound that requires packing, please see wound care instructions.  In general, remove all dressings, wash wound with soap and water and then replace with saline moistened gauze.  Do the dressing change at least every day.  Please call our office [phone] if you have further questions.     Discharge wound care:    Complete by:  As directed   If you have closed incisions, shower and bathe over these incisions with soap and water every day.  Remove all surgical dressings on postoperative day #3.  You do not need to replace dressings over the closed incisions unless you feel more comfortable with a Band-Aid covering it.   If you have an open wound that requires packing, please see wound care instructions.  In general, remove all dressings, wash wound with soap and water and then replace with saline moistened gauze.  Do the dressing change at least every day.  Please call our office [phone] if you have further questions.     Driving Restrictions    Complete by:  As directed   No driving until off narcotics and can safely swerve away without pain during an emergency     Driving Restrictions    Complete by:  As directed   No driving until off narcotics and can safely swerve away without pain during an emergency      Increase activity slowly    Complete by:  As directed   Walk an hour a day.  Use 20-30 minute walks.  When you can walk 30 minutes without difficulty, increase to low impact/moderate activities such as biking, jogging, swimming, sexual activity..  Eventually can increase to unrestricted activity when not feeling pain.  If you feel pain: STOP!Marland Kitchen   Let pain protect you from overdoing it.  Use ice/heat/over-the-counter pain medications to help minimize his soreness.  Use pain prescriptions as needed to remain active.  It is better to take extra pain medications and be more active than to stay bedridden to avoid all pain medications.     Increase activity slowly    Complete by:  As directed   Walk an hour a day.  Use 20-30 minute walks.  When you can walk 30 minutes without difficulty, increase to low impact/moderate activities such as biking, jogging, swimming, sexual activity..  Eventually can increase to unrestricted activity when not feeling pain.  If you feel pain: STOP!Marland Kitchen   Let pain protect you from overdoing it.  Use ice/heat/over-the-counter pain medications to help minimize his soreness.  Use pain prescriptions as needed to remain active.  It is better to take extra pain medications and be more active than to stay bedridden to avoid all pain medications.     Lifting restrictions    Complete by:  As directed   Avoid heavy lifting initially.  Do not push through pain.  You have no specific weight limit.  Coughing and sneezing or four more stressful to your incision than any lifting you will do. Pain will protect you from injury.  Therefore, avoid intense activity until off all narcotic pain medications.  Coughing and sneezing  or four more stressful to your incision than any lifting he will do.     Lifting restrictions    Complete by:  As directed   Avoid heavy lifting initially.  Do not push through pain.  You have no specific weight limit.  Coughing and sneezing or four more stressful to your incision  than any lifting you will do. Pain will protect you from injury.  Therefore, avoid intense activity until off all narcotic pain medications.  Coughing and sneezing or four more stressful to your incision than any lifting he will do.     May shower / Bathe    Complete by:  As directed      May shower / Bathe    Complete by:  As directed      May walk up steps    Complete by:  As directed      May walk up steps    Complete by:  As directed      Sexual Activity Restrictions    Complete by:  As directed   Sexual activity as tolerated.  Do not push through pain.  Pain will protect you from injury.     Sexual Activity Restrictions    Complete by:  As directed   Sexual activity as tolerated.  Do not push through pain.  Pain will protect you from injury.     Walk with assistance    Complete by:  As directed   Walk over an hour a day.  May use a walker/cane/companion to help with balance and stamina.     Walk with assistance    Complete by:  As directed   Walk over an hour a day.  May use a walker/cane/companion to help with balance and stamina.            Medication List    STOP taking these medications        tamsulosin 0.4 MG Caps capsule  Commonly known as:  FLOMAX      TAKE these medications        acetaminophen 325 MG tablet  Commonly known as:  TYLENOL  Take 650 mg by mouth every 6 (six) hours as needed for moderate pain or headache.     aspirin EC 81 MG tablet  Take 1 tablet (81 mg total) by mouth daily.     feeding supplement (PRO-STAT SUGAR FREE 64) Liqd  Take 30 mLs by mouth 2 (two) times daily at 8 am and 10 pm.     Fish Oil 1000 MG Caps  Take 1,000 mg by mouth every morning.     IRON PO  Take 1 tablet by mouth every morning.     JOBST FOR MEN 15-20MMHG MED Misc  1 Package by Does not apply route daily.     loratadine 10 MG tablet  Commonly known as:  CLARITIN  Take 1 tablet (10 mg total) by mouth daily.     LORazepam 0.5 MG tablet  Commonly known as:   ATIVAN  Take 1/2 tablet po BID PRN     metoprolol tartrate 25 MG tablet  Commonly known as:  LOPRESSOR  Take 0.5 tablets (12.5 mg total) by mouth 2 (two) times daily.     omeprazole 20 MG capsule  Commonly known as:  PRILOSEC  Take 20 mg by mouth 2 (two) times daily.     ondansetron 4 MG tablet  Commonly known as:  ZOFRAN  Take 1 tablet (4 mg total) by mouth every  8 (eight) hours as needed for nausea.     ONE DAILY FOR MEN 50+ ADVANCED PO  Take 1 tablet by mouth every morning.     OVER THE COUNTER MEDICATION  Take 1 tablet by mouth 2 (two) times daily. Supplements for joints.     oxyCODONE 5 MG immediate release tablet  Commonly known as:  Oxy IR/ROXICODONE  Take 1-2 tablets (5-10 mg total) by mouth every 4 (four) hours as needed for moderate pain or severe pain.     saw palmetto 160 MG capsule  Take 160 mg by mouth every morning.     vitamin C 500 MG tablet  Commonly known as:  ASCORBIC ACID  Take 500 mg by mouth 2 (two) times daily.           Follow-up Information    Follow up with Keshan Reha C., MD In 2 weeks.   Specialty:  General Surgery   Why:  To follow up after your operation, To follow up after your hospital stay   Contact information:   [place] [id number] [phone]        Signed: Morton Peters, M.D., F.A.C.S. Gastrointestinal and Minimally Invasive Surgery [facility] [address] [place], [place] [address] [phone] Main / Paging   08/28/2014, 7:57 AM

## 2014-08-28 NOTE — Progress Notes (Signed)
Pt is alert and oriented, waiting for wife to come so he can be discharged

## 2014-08-28 NOTE — Consult Note (Signed)
WOC ostomy follow up Stoma type/location: LLQ Colostomy Stomal assessment/size: 1 and 1/2 inches round Peristomal assessment: intact, clear Treatment options for stomal/peristomal skin: none needed Output soft brown stool Ostomy pouching: 2pc., 2 and 1/4 inches  Education provided: patient taught how to release gas without emptying (burp pouch) since the pouches he is using here at the hospital do not have an integrated gas filter. Enrolled patient in [place] Start Discharge program: Yes Ready for discharge from an Ostomy RN perspective to the care of a competent caregiver (wife) with support from family and friends and with followup from his PCP and Psychologist, sport and exercise. [facility] nursing team will not follow, but will remain available to this patient, the nursing and medical team.  Please re-consult if needed. Thanks, Maudie Flakes, MSN, RN, [place], [place], [place] [phone])

## 2014-09-03 ENCOUNTER — Encounter (HOSPITAL_COMMUNITY): Payer: Self-pay | Admitting: Surgery

## 2014-11-09 ENCOUNTER — Encounter: Payer: Self-pay | Admitting: Family Medicine

## 2014-11-09 ENCOUNTER — Ambulatory Visit (INDEPENDENT_AMBULATORY_CARE_PROVIDER_SITE_OTHER): Payer: Medicaid Other | Admitting: Family Medicine

## 2014-11-09 VITALS — BP 158/98 | Ht 64.0 in | Wt 191.0 lb

## 2014-11-09 DIAGNOSIS — D473 Essential (hemorrhagic) thrombocythemia: Secondary | ICD-10-CM

## 2014-11-09 DIAGNOSIS — K219 Gastro-esophageal reflux disease without esophagitis: Secondary | ICD-10-CM

## 2014-11-09 DIAGNOSIS — I10 Essential (primary) hypertension: Secondary | ICD-10-CM | POA: Diagnosis not present

## 2014-11-09 DIAGNOSIS — Z933 Colostomy status: Secondary | ICD-10-CM

## 2014-11-09 DIAGNOSIS — D75839 Thrombocytosis, unspecified: Secondary | ICD-10-CM

## 2014-11-09 MED ORDER — METOPROLOL TARTRATE 25 MG PO TABS: 25.0000 mg | ORAL_TABLET | Freq: Two times a day (BID) | ORAL | Status: DC

## 2014-11-09 NOTE — Progress Notes (Signed)
   Subjective:    Patient ID: [name], male    DOB: 12-12-1955, 59 y.o.   MRN: [id number]  HPI Patient is here today for a check up.  Would like to discuss BP med. Compliant with medicine. Often blood pressures when checked elsewhere are subtherapeutic.  Had abdominal surgery. Still has one colostomy. Worried he may have a hernia. No major pain.  Had colon surgery in Jan.   Patient compliance with reflux medicine. No obvious side effects. Definitely helps him.   Review of Systems No headache no chest pain some abdominal pain no rash no fever ROS otherwise negative    Objective:   Physical Exam Alert vital stable HET normal lungs clear heart regular rhythm blood pressure repeat suboptimum 142/92. Abdomen postsurgical fibrosis palpated no true hernia abdomen benign otherwise       Assessment & Plan:  Impression 1 hypertension suboptimal discussed #2 reflux stable. #3 abdominal concerns within normal limits reassured #4 chronic disability ongoing plan increase metoprolol to 25 twice a day. Rationale discussed. Maintain other medications. Diet exercise discussed. Check every 6 months. WSL

## 2014-12-17 ENCOUNTER — Encounter: Payer: Self-pay | Admitting: Gastroenterology

## 2014-12-31 ENCOUNTER — Ambulatory Visit (INDEPENDENT_AMBULATORY_CARE_PROVIDER_SITE_OTHER): Payer: Medicaid Other | Admitting: Gastroenterology

## 2014-12-31 ENCOUNTER — Other Ambulatory Visit: Payer: Self-pay

## 2014-12-31 ENCOUNTER — Encounter: Payer: Self-pay | Admitting: Gastroenterology

## 2014-12-31 VITALS — BP 134/83 | HR 64 | Temp 97.7°F | Ht 65.0 in | Wt 189.6 lb

## 2014-12-31 DIAGNOSIS — C20 Malignant neoplasm of rectum: Secondary | ICD-10-CM | POA: Diagnosis not present

## 2014-12-31 NOTE — Progress Notes (Signed)
ON RECALL LIST  °

## 2014-12-31 NOTE — Progress Notes (Signed)
Subjective:    Patient ID: [name], male    DOB: 06-13-1956, 59 y.o.   MRN: [id number] Mickie Hillier, MD  HPI WHEN WALKS WITHOUT CANE, OFF BALANCE. HAVING TROUBLE WITH PROSTATE. OVERALL RECTAL CA AND 3 SURGERIES SLOWED HIM DOWN. 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 2ND SURGERY PT REPORTS HE CODED AND HAD TO GO TO REHAB. EFFECTED HIS MEMORY. ADAPTED TO THE BAG ON HIS LEFT SIDE. SORE LOWER ABDOMEN. SOB WHEN MOVES AROUND. CHANGE BAG: 2 TIMES A DAY. RING CHANGES EVERY 4 DAYS.   PT DENIES FEVER, CHILLS, HEMATOCHEZIA, HEMATEMESIS, nausea, vomiting, melena, diarrhea, CHEST PAIN, CHANGE IN BOWEL IN HABITS, constipation, OR problems swallowing, OR heartburn or indigestion.   Past Medical History  Diagnosis Date  . GERD (gastroesophageal reflux disease)   . Arthritis   . Aspiration pneumonia 03/24/2014  . Hypertension   . Difficulty urinating   . Anxiety   . Colostomy in place   . Ileostomy present   . Shortening, leg, congenital     LEFT  . Rectal cancer, uT2uN0, s/p LAR/ileostomy 03/20/2014 colostomy Aug2015 12/25/2013    TCS MAY 2015    Past Surgical History  Procedure Laterality Date  . Colonoscopy N/A 12/22/2013    SLF: 1. Normal mucosa in the terminal ilium. 2. Moderate diverticulosis in the transverse colon, descending colon, and sigmoid colon. 3. Rectal bleeding due ot rectal mass and hemorrhoids 4. Moderate sized internal hemorrhoids.   . Esophagogastroduodenoscopy N/A 12/22/2013    SLF: Probable Candia esophagitis2. Probable Barretts esphagus 3. Mild gastritis.   . Eus N/A 01/01/2014    Procedure: LOWER ENDOSCOPIC ULTRASOUND (EUS);  Surgeon: Milus Banister, MD;  Location: [facility];  Service: Endoscopy;  Laterality: N/A;  . Hip fracture surgery Left age 78 on 04-06-1970  . Eye reattachment and ear reattachment  Right 1971  . Colon resection N/A 03/24/2014    Procedure: Diagnostic  laparotomy, takedown of coloanal anastamosis, end colostomy, with wound vac;  Surgeon: Adin Hector,  MD;  Location: [facility];  Service: General;  Laterality: N/A;  . Flexible sigmoidoscopy N/A 03/24/2014    Procedure: FLEXIBLE SIGMOIDOSCOPY;  Surgeon: Adin Hector, MD;  Location: [facility];  Service: General;  Laterality: N/A;  . Ileostomy    . Colostomy    . Ileostomy closure N/A 08/25/2014    Procedure: TAKEDOWN OF LOOP ILEOSTOMY ;  Surgeon: Michael Boston, MD;  Location: [facility];  Service: General;  Laterality: N/A;  . Examination under anesthesia  08/25/2014    Procedure: Jasmine December UNDER ANESTHESIA;  Surgeon: Michael Boston, MD;  Location: [facility];  Service: General;;   No Known Allergies  Current Outpatient Prescriptions  Medication Sig Dispense Refill  . aspirin EC 81 MG tablet Take 1 tablet (81 mg total) by mouth daily.    . IRON PO Take 1 tablet by mouth every morning.    . metoprolol tartrate (LOPRESSOR) 25 MG tablet Take 1 tablet (25 mg total) by mouth 2 (two) times daily.    . Multiple Vitamins-Minerals (ONE DAILY FOR MEN 50+ ADVANCED PO) Take 1 tablet by mouth every morning.     . Omega-3 Fatty Acids (FISH OIL) 1000 MG CAPS Take 1,000 mg by mouth every morning.     Marland Kitchen omeprazole (PRILOSEC) 20 MG capsule Take 20 mg by mouth 2 (two) times daily.    Marland Kitchen OVER THE COUNTER MEDICATION Take 1 tablet by mouth 2 (two) times daily. Supplements for joints.    Marland Kitchen Specialty Vitamins Products (PROSTATE  PO) Take by mouth.    . vitamin C (ASCORBIC ACID) 500 MG tablet Take 500 mg by mouth 2 (two) times daily.    .      .        Review of Systems     Objective:   Physical Exam  Constitutional: He is oriented to person, place, and time. He appears well-developed and well-nourished. No distress.  HENT:  Head: Normocephalic and atraumatic.  Mouth/Throat: Oropharynx is clear and moist. No oropharyngeal exudate.  Eyes: Pupils are equal, round, and reactive to light. No scleral icterus.  Neck: Normal range of motion. Neck supple.  Cardiovascular: Normal rate, regular rhythm and normal heart sounds.     Pulmonary/Chest: Effort normal and breath sounds normal. No respiratory distress.  Abdominal: Soft. Bowel sounds are normal. He exhibits no distension. There is tenderness. There is no rebound and no guarding.  MODERATE TTP IN BLQs  Musculoskeletal: He exhibits no edema.  WALKS ASSISTED WITH A CANE.  Lymphadenopathy:    He has no cervical adenopathy.  Neurological: He is alert and oriented to person, place, and time.  NO  NEW FOCAL DEFICITS   Psychiatric: He has a normal mood and affect.  Vitals reviewed.         Assessment & Plan:

## 2014-12-31 NOTE — Progress Notes (Signed)
CC'ED TO PCP 

## 2014-12-31 NOTE — Assessment & Plan Note (Addendum)
NO WARNING SIGNS/SYMPTOMS  TCS WITHIN NEXT 30 DAYS-PREPOPIK/CLEAR LIQUID DIET. DISCUSSED PROCEDURE, BENEFITS, & RISKS: < 1% chance of medication reaction, bleeding, perforation, or rupture of spleen/liver.  FOLLOW UP IN 1 YEAR.

## 2014-12-31 NOTE — Patient Instructions (Addendum)
FOLLOW A REGULAR BREAK FAST THEN A CLEAR LIQUID DIET AFTER 9 AM ON THE DAY BEFORE YOUR COLONOSCOPY.   TAKE PREPOPIK ON THE DAY BEFORE YOUR COLONOSCOPY. IF YOU AR RUNNING CLEAR YOU DO NOT NEED THE SECOND DOSE.  FOLLOW UP IN 1 YEAR.

## 2015-01-19 ENCOUNTER — Ambulatory Visit (HOSPITAL_COMMUNITY)
Admission: RE | Admit: 2015-01-19 | Discharge: 2015-01-19 | Disposition: A | Discharge status: Home / Self Care | Payer: Medicaid Other | Source: Ambulatory Visit | Attending: Gastroenterology | Admitting: Gastroenterology

## 2015-01-19 ENCOUNTER — Encounter (HOSPITAL_COMMUNITY)
Admission: RE | Disposition: A | Discharge status: Home / Self Care | Payer: Self-pay | Source: Ambulatory Visit | Attending: Gastroenterology

## 2015-01-19 ENCOUNTER — Encounter (HOSPITAL_COMMUNITY): Payer: Self-pay | Admitting: *Deleted

## 2015-01-19 DIAGNOSIS — Z932 Ileostomy status: Secondary | ICD-10-CM | POA: Diagnosis not present

## 2015-01-19 DIAGNOSIS — Z9049 Acquired absence of other specified parts of digestive tract: Secondary | ICD-10-CM | POA: Insufficient documentation

## 2015-01-19 DIAGNOSIS — Z79899 Other long term (current) drug therapy: Secondary | ICD-10-CM | POA: Insufficient documentation

## 2015-01-19 DIAGNOSIS — C20 Malignant neoplasm of rectum: Secondary | ICD-10-CM

## 2015-01-19 DIAGNOSIS — I10 Essential (primary) hypertension: Secondary | ICD-10-CM | POA: Insufficient documentation

## 2015-01-19 DIAGNOSIS — Z7982 Long term (current) use of aspirin: Secondary | ICD-10-CM | POA: Insufficient documentation

## 2015-01-19 DIAGNOSIS — F419 Anxiety disorder, unspecified: Secondary | ICD-10-CM | POA: Diagnosis not present

## 2015-01-19 DIAGNOSIS — Z85048 Personal history of other malignant neoplasm of rectum, rectosigmoid junction, and anus: Secondary | ICD-10-CM | POA: Diagnosis not present

## 2015-01-19 DIAGNOSIS — M1990 Unspecified osteoarthritis, unspecified site: Secondary | ICD-10-CM | POA: Insufficient documentation

## 2015-01-19 DIAGNOSIS — K219 Gastro-esophageal reflux disease without esophagitis: Secondary | ICD-10-CM | POA: Diagnosis not present

## 2015-01-19 DIAGNOSIS — Z85028 Personal history of other malignant neoplasm of stomach: Secondary | ICD-10-CM | POA: Diagnosis present

## 2015-01-19 DIAGNOSIS — Z933 Colostomy status: Secondary | ICD-10-CM | POA: Insufficient documentation

## 2015-01-19 DIAGNOSIS — K5730 Diverticulosis of large intestine without perforation or abscess without bleeding: Secondary | ICD-10-CM | POA: Insufficient documentation

## 2015-01-19 HISTORY — PX: COLONOSCOPY: SHX5424

## 2015-01-19 SURGERY — COLONOSCOPY
Anesthesia: Moderate Sedation

## 2015-01-19 MED ORDER — MIDAZOLAM HCL 5 MG/5ML IJ SOLN
INTRAMUSCULAR | Status: DC | PRN
  Administered 2015-01-19 (×2): 2 mg via INTRAVENOUS

## 2015-01-19 MED ORDER — SODIUM CHLORIDE 0.9 % IV SOLN
INTRAVENOUS | Status: DC
  Administered 2015-01-19: 12:00:00 via INTRAVENOUS

## 2015-01-19 MED ORDER — MEPERIDINE HCL 100 MG/ML IJ SOLN
INTRAMUSCULAR | Status: AC
  Filled 2015-01-19: qty 2

## 2015-01-19 MED ORDER — MIDAZOLAM HCL 5 MG/5ML IJ SOLN
INTRAMUSCULAR | Status: AC
  Filled 2015-01-19: qty 10

## 2015-01-19 MED ORDER — MEPERIDINE HCL 100 MG/ML IJ SOLN
INTRAMUSCULAR | Status: DC | PRN
  Administered 2015-01-19 (×2): 25 mg via INTRAVENOUS

## 2015-01-19 MED ORDER — STERILE WATER FOR IRRIGATION IR SOLN
Status: DC | PRN
  Administered 2015-01-19: 13:00:00

## 2015-01-19 MED ORDER — LIDOCAINE VISCOUS 2 % MT SOLN
OROMUCOSAL | Status: AC
  Filled 2015-01-19: qty 15

## 2015-01-19 NOTE — Discharge Instructions (Signed)
YOU DID NOT HAVE ANY POLYPS. YOU DO HAVE DIVERTICULOSIS.   DRINK WATER TO KEEP YOUR URINE LIGHT YELLOW.  FOLLOW A HIGH FIBER DIET. AVOID ITEMS THAT CAUSE BLOATING. SEE INFO BELOW.  Next colonoscopy IN 3 YEARS.  YOUR SISTERS, BROTHERS, CHILDREN, AND PARENTS NEED TO HAVE A COLONOSCOPY STARTING AT THE AGE OF 40 and then every 5 years    ENDOSCOPY Care After Read the instructions outlined below and refer to this sheet in the next week. These discharge instructions provide you with general information on caring for yourself after you leave the hospital. While your treatment has been planned according to the most current medical practices available, unavoidable complications occasionally occur. If you have any problems or questions after discharge, call DR. Tykeshia Tourangeau, [phone].  ACTIVITY  You may resume your regular activity, but move at a slower pace for the next 24 hours.   Take frequent rest periods for the next 24 hours.   Walking will help get rid of the air and reduce the bloated feeling in your belly (abdomen).   No driving for 24 hours (because of the medicine (anesthesia) used during the test).   You may shower.   Do not sign any important legal documents or operate any machinery for 24 hours (because of the anesthesia used during the test).    NUTRITION  Drink plenty of fluids.   You may resume your normal diet as instructed by your doctor.   Begin with a light meal and progress to your normal diet. Heavy or fried foods are harder to digest and may make you feel sick to your stomach (nauseated).   Avoid alcoholic beverages for 24 hours or as instructed.    MEDICATIONS  You may resume your normal medications.   WHAT YOU CAN EXPECT TODAY  Some feelings of bloating in the abdomen.   Passage of more gas than usual.   Spotting of blood in your stool or on the toilet paper  .  IF YOU HAD POLYPS REMOVED DURING THE ENDOSCOPY:  Eat a soft diet IF YOU HAVE  NAUSEA, BLOATING, ABDOMINAL PAIN, OR VOMITING.    FINDING OUT THE RESULTS OF YOUR TEST Not all test results are available during your visit. DR. Oneida Alar WILL CALL YOU WITHIN 7 DAYS OF YOUR PROCEDUE WITH YOUR RESULTS. Do not assume everything is normal if you have not heard from DR. Navjot Loera IN ONE WEEK, CALL HER OFFICE AT [phone].  SEEK IMMEDIATE MEDICAL ATTENTION AND CALL THE OFFICE: [phone] IF:  You have more than a spotting of blood in your stool.   Your belly is swollen (abdominal distention).   You are nauseated or vomiting.   You have a temperature over 101F.   You have abdominal pain or discomfort that is severe or gets worse throughout the day.    High-Fiber Diet A high-fiber diet changes your normal diet to include more whole grains, legumes, fruits, and vegetables. Changes in the diet involve replacing refined carbohydrates with unrefined foods. The calorie level of the diet is essentially unchanged. The Dietary Reference Intake (recommended amount) for adult males is 38 grams per day. For adult females, it is 25 grams per day. Pregnant and lactating women should consume 28 grams of fiber per day. Fiber is the intact part of a plant that is not broken down during digestion. Functional fiber is fiber that has been isolated from the plant to provide a beneficial effect in the body. PURPOSE  Increase stool bulk.   Ease and  regulate bowel movements.   Lower cholesterol.   REDUCE RISK OF COLON CANCER  INDICATIONS THAT YOU NEED MORE FIBER  Constipation and hemorrhoids.   Uncomplicated diverticulosis (intestine condition) and irritable bowel syndrome.   Weight management.   As a protective measure against hardening of the arteries (atherosclerosis), diabetes, and cancer.   GUIDELINES FOR INCREASING FIBER IN THE DIET  Start adding fiber to the diet slowly. A gradual increase of about 5 more grams (2 slices of whole-wheat bread, 2 servings of most fruits or  vegetables, or 1 bowl of high-fiber cereal) per day is best. Too rapid an increase in fiber may result in constipation, flatulence, and bloating.   Drink enough water and fluids to keep your urine clear or pale yellow. Water, juice, or caffeine-free drinks are recommended. Not drinking enough fluid may cause constipation.   Eat a variety of high-fiber foods rather than one type of fiber.   Try to increase your intake of fiber through using high-fiber foods rather than fiber pills or supplements that contain small amounts of fiber.   The goal is to change the types of food eaten. Do not supplement your present diet with high-fiber foods, but replace foods in your present diet.  INCLUDE A VARIETY OF FIBER SOURCES  Replace refined and processed grains with whole grains, canned fruits with fresh fruits, and incorporate other fiber sources. White rice, white breads, and most bakery goods contain little or no fiber.   Brown whole-grain rice, buckwheat oats, and many fruits and vegetables are all good sources of fiber. These include: broccoli, Brussels sprouts, cabbage, cauliflower, beets, sweet potatoes, white potatoes (skin on), carrots, tomatoes, eggplant, squash, berries, fresh fruits, and dried fruits.   Cereals appear to be the richest source of fiber. Cereal fiber is found in whole grains and bran. Bran is the fiber-rich outer coat of cereal grain, which is largely removed in refining. In whole-grain cereals, the bran remains. In breakfast cereals, the largest amount of fiber is found in those with "bran" in their names. The fiber content is sometimes indicated on the label.   You may need to include additional fruits and vegetables each day.   In baking, for 1 cup white flour, you may use the following substitutions:   1 cup whole-wheat flour minus 2 tablespoons.   1/2 cup white flour plus 1/2 cup whole-wheat flour.   Diverticulosis Diverticulosis is a common condition that develops when  small pouches (diverticula) form in the wall of the colon. The risk of diverticulosis increases with age. It happens more often in people who eat a low-fiber diet. Most individuals with diverticulosis have no symptoms. Those individuals with symptoms usually experience belly (abdominal) pain, constipation, or loose stools (diarrhea).  HOME CARE INSTRUCTIONS  Increase the amount of fiber in your diet as directed by your caregiver or dietician. This may reduce symptoms of diverticulosis.   Drink at least 6 to 8 glasses of water each day to prevent constipation.   Try not to strain when you have a bowel movement.   Avoiding nuts and seeds to prevent complications is not necessary.   FOODS HAVING HIGH FIBER CONTENT INCLUDE:  Fruits. Apple, peach, pear, tangerine, raisins, prunes.   Vegetables. Brussels sprouts, asparagus, broccoli, cabbage, carrot, cauliflower, romaine lettuce, spinach, summer squash, tomato, winter squash, zucchini.   Starchy Vegetables. Baked beans, kidney beans, lima beans, split peas, lentils, potatoes (with skin).   Grains. Whole wheat bread, brown rice, bran flake cereal, plain oatmeal, white rice,  shredded wheat, bran muffins.    SEEK IMMEDIATE MEDICAL CARE IF:  You develop increasing pain or severe bloating.   You have an oral temperature above 101F.   You develop vomiting or bowel movements that are bloody or black.

## 2015-01-19 NOTE — H&P (View-Only) (Signed)
Subjective:    Patient ID: [name], male    DOB: [birth year], 59 y.o.   MRN: [id number] Mickie Hillier, MD  HPI WHEN WALKS WITHOUT CANE, OFF BALANCE. HAVING TROUBLE WITH PROSTATE. OVERALL RECTAL CA AND 3 SURGERIES SLOWED HIM DOWN. 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 2ND SURGERY PT REPORTS HE CODED AND HAD TO GO TO REHAB. EFFECTED HIS MEMORY. ADAPTED TO THE BAG ON HIS LEFT SIDE. SORE LOWER ABDOMEN. SOB WHEN MOVES AROUND. CHANGE BAG: 2 TIMES A DAY. RING CHANGES EVERY 4 DAYS.   PT DENIES FEVER, CHILLS, HEMATOCHEZIA, HEMATEMESIS, nausea, vomiting, melena, diarrhea, CHEST PAIN, CHANGE IN BOWEL IN HABITS, constipation, OR problems swallowing, OR heartburn or indigestion.   Past Medical History  Diagnosis Date  . GERD (gastroesophageal reflux disease)   . Arthritis   . Aspiration pneumonia 03/24/2014  . Hypertension   . Difficulty urinating   . Anxiety   . Colostomy in place   . Ileostomy present   . Shortening, leg, congenital     LEFT  . Rectal cancer, uT2uN0, s/p LAR/ileostomy 03/20/2014 colostomy Aug2015 12/25/2013    TCS MAY 2015    Past Surgical History  Procedure Laterality Date  . Colonoscopy N/A 12/22/2013    SLF: 1. Normal mucosa in the terminal ilium. 2. Moderate diverticulosis in the transverse colon, descending colon, and sigmoid colon. 3. Rectal bleeding due ot rectal mass and hemorrhoids 4. Moderate sized internal hemorrhoids.   . Esophagogastroduodenoscopy N/A 12/22/2013    SLF: Probable Candia esophagitis2. Probable Barretts esphagus 3. Mild gastritis.   . Eus N/A 01/01/2014    Procedure: LOWER ENDOSCOPIC ULTRASOUND (EUS);  Surgeon: Milus Banister, MD;  Location: [facility];  Service: Endoscopy;  Laterality: N/A;  . Hip fracture surgery Left age 49 on 04-06-1970  . Eye reattachment and ear reattachment  Right 1971  . Colon resection N/A 03/24/2014    Procedure: Diagnostic  laparotomy, takedown of coloanal anastamosis, end colostomy, with wound vac;  Surgeon: Adin Hector,  MD;  Location: [facility];  Service: General;  Laterality: N/A;  . Flexible sigmoidoscopy N/A 03/24/2014    Procedure: FLEXIBLE SIGMOIDOSCOPY;  Surgeon: Adin Hector, MD;  Location: [facility];  Service: General;  Laterality: N/A;  . Ileostomy    . Colostomy    . Ileostomy closure N/A 08/25/2014    Procedure: TAKEDOWN OF LOOP ILEOSTOMY ;  Surgeon: Michael Boston, MD;  Location: [facility];  Service: General;  Laterality: N/A;  . Examination under anesthesia  08/25/2014    Procedure: Jasmine December UNDER ANESTHESIA;  Surgeon: Michael Boston, MD;  Location: [facility];  Service: General;;   No Known Allergies  Current Outpatient Prescriptions  Medication Sig Dispense Refill  . aspirin EC 81 MG tablet Take 1 tablet (81 mg total) by mouth daily.    . IRON PO Take 1 tablet by mouth every morning.    . metoprolol tartrate (LOPRESSOR) 25 MG tablet Take 1 tablet (25 mg total) by mouth 2 (two) times daily.    . Multiple Vitamins-Minerals (ONE DAILY FOR MEN 50+ ADVANCED PO) Take 1 tablet by mouth every morning.     . Omega-3 Fatty Acids (FISH OIL) 1000 MG CAPS Take 1,000 mg by mouth every morning.     Marland Kitchen omeprazole (PRILOSEC) 20 MG capsule Take 20 mg by mouth 2 (two) times daily.    Marland Kitchen OVER THE COUNTER MEDICATION Take 1 tablet by mouth 2 (two) times daily. Supplements for joints.    Marland Kitchen Specialty Vitamins Products (PROSTATE  PO) Take by mouth.    . vitamin C (ASCORBIC ACID) 500 MG tablet Take 500 mg by mouth 2 (two) times daily.    .      .        Review of Systems     Objective:   Physical Exam  Constitutional: He is oriented to person, place, and time. He appears well-developed and well-nourished. No distress.  HENT:  Head: Normocephalic and atraumatic.  Mouth/Throat: Oropharynx is clear and moist. No oropharyngeal exudate.  Eyes: Pupils are equal, round, and reactive to light. No scleral icterus.  Neck: Normal range of motion. Neck supple.  Cardiovascular: Normal rate, regular rhythm and normal heart sounds.     Pulmonary/Chest: Effort normal and breath sounds normal. No respiratory distress.  Abdominal: Soft. Bowel sounds are normal. He exhibits no distension. There is tenderness. There is no rebound and no guarding.  MODERATE TTP IN BLQs  Musculoskeletal: He exhibits no edema.  WALKS ASSISTED WITH A CANE.  Lymphadenopathy:    He has no cervical adenopathy.  Neurological: He is alert and oriented to person, place, and time.  NO  NEW FOCAL DEFICITS   Psychiatric: He has a normal mood and affect.  Vitals reviewed.         Assessment & Plan:

## 2015-01-19 NOTE — Interval H&P Note (Signed)
History and Physical Interval Note:  01/19/2015 12:50 PM  [name]  has presented today for surgery, with the diagnosis of rectal cancer  The various methods of treatment have been discussed with the patient and family. After consideration of risks, benefits and other options for treatment, the patient has consented to  Procedure(s) with comments: COLONOSCOPY (N/A) - 1215 as a surgical intervention .  The patient's history has been reviewed, patient examined, no change in status, stable for surgery.  I have reviewed the patient's chart and labs.  Questions were answered to the patient's satisfaction.     [employer]

## 2015-01-20 NOTE — Op Note (Signed)
[facility] [address] [place], [address]   COLONOSCOPY PROCEDURE REPORT  PATIENT: [name], [name]  MR#: [id number] BIRTHDATE: 20-Jan-1956 , 63  yrs. old GENDER: male ENDOSCOPIST: Danie Binder, MD REFERRED ZD:GLOVFIE Wolfgang Phoenix, M.D. PROCEDURE DATE:  01/19/2015 PROCEDURE:   Diagnostic colonoscopy via stoma INDICATIONS:high risk patient with personal history of rectal cancer. MEDICATIONS: Demerol 50 mg IV and Versed 4 mg IV  DESCRIPTION OF PROCEDURE:    Physical exam was performed.  Informed consent was obtained from the patient after explaining the benefits, risks, and alternatives to procedure.  The patient was connected to monitor and placed in left lateral position. Continuous oxygen was provided by nasal cannula and IV medicine administered through an indwelling cannula.  After administration of sedation and rectal exam, the patients rectum was intubated and the EC-[id number] ([id number])  colonoscope was advanced under direct visualization to the cecum.  The scope was removed slowly by carefully examining the color, texture, anatomy, and integrity mucosa on the way out.  The patient was recovered in endoscopy and discharged home in satisfactory condition.    COLON FINDINGS: There was mild diverticulosis noted in the ascending colon.  OTHERWISE, The colonic mucosa appeared normal.  , and NORMAL STOMA.    NO POLYPS. PREP QUALITY: good.CECAL W/D TIME: 10       minutes COMPLICATIONS: None  ENDOSCOPIC IMPRESSION: 1.   Mild diverticulosis in the ascending colon 2.   The colonic mucosa appeared normal 3.   NORMAL STOMA  RECOMMENDATIONS: DRINK WATER TO KEEP YOUR URINE LIGHT YELLOW. FOLLOW A HIGH FIBER DIET. Next colonoscopy IN 3 YEARS. ALL SISTERS, BROTHERS, CHILDREN, AND PARENTS NEED A COLONOSCOPY STARTING AT THE AGE OF 40 and then every 5 years      _______________________________ eSignedDanie Binder, MD 02/09/15 2:02 PM    CPT CODES: ICD  CODES:  The ICD and CPT codes recommended by this software are interpretations from the data that the clinical staff has captured with the software.  The verification of the translation of this report to the ICD and CPT codes and modifiers is the sole responsibility of the health care institution and practicing physician where this report was generated.  [employer] will not be held responsible for the validity of the ICD and CPT codes included on this report.  AMA assumes no liability for data contained or not contained herein. CPT is a Designer, television/film set of the [employer].

## 2015-01-21 ENCOUNTER — Encounter (HOSPITAL_COMMUNITY): Payer: Self-pay | Admitting: Gastroenterology

## 2015-02-16 ENCOUNTER — Telehealth: Payer: Self-pay | Admitting: Family Medicine

## 2015-02-16 NOTE — Telephone Encounter (Signed)
Pt is needing a prescription for his ostomy bags faxed over to [facility] at [phone]

## 2015-02-16 NOTE — Telephone Encounter (Signed)
Spoke with patient and informed him that prescription for ostomy bags would be faxed in to [facility]. Patient verbalized understanding.

## 2015-02-16 NOTE — Telephone Encounter (Signed)
Lets do ref prn

## 2015-02-17 ENCOUNTER — Telehealth: Payer: Self-pay | Admitting: Family Medicine

## 2015-02-17 NOTE — Telephone Encounter (Signed)
Pt dropped of a handicap placard to be filled out. Please call pt when the form is complete.

## 2015-05-11 ENCOUNTER — Ambulatory Visit (INDEPENDENT_AMBULATORY_CARE_PROVIDER_SITE_OTHER): Payer: BLUE CROSS/BLUE SHIELD | Admitting: Family Medicine

## 2015-05-11 ENCOUNTER — Encounter: Payer: Self-pay | Admitting: Family Medicine

## 2015-05-11 VITALS — BP 130/82 | Ht 64.0 in | Wt 194.4 lb

## 2015-05-11 DIAGNOSIS — Z13220 Encounter for screening for lipoid disorders: Secondary | ICD-10-CM | POA: Diagnosis not present

## 2015-05-11 DIAGNOSIS — D75839 Thrombocytosis, unspecified: Secondary | ICD-10-CM

## 2015-05-11 DIAGNOSIS — D473 Essential (hemorrhagic) thrombocythemia: Secondary | ICD-10-CM

## 2015-05-11 DIAGNOSIS — C20 Malignant neoplasm of rectum: Secondary | ICD-10-CM | POA: Diagnosis not present

## 2015-05-11 DIAGNOSIS — Z23 Encounter for immunization: Secondary | ICD-10-CM | POA: Diagnosis not present

## 2015-05-11 DIAGNOSIS — K219 Gastro-esophageal reflux disease without esophagitis: Secondary | ICD-10-CM

## 2015-05-11 DIAGNOSIS — I10 Essential (primary) hypertension: Secondary | ICD-10-CM | POA: Diagnosis not present

## 2015-05-11 DIAGNOSIS — R000 Tachycardia, unspecified: Secondary | ICD-10-CM | POA: Diagnosis not present

## 2015-05-11 MED ORDER — METOPROLOL TARTRATE 25 MG PO TABS: 25.0000 mg | ORAL_TABLET | Freq: Two times a day (BID) | ORAL | Status: DC

## 2015-05-11 MED ORDER — OMEPRAZOLE 20 MG PO CPDR: 20.0000 mg | DELAYED_RELEASE_CAPSULE | Freq: Two times a day (BID) | ORAL | Status: DC

## 2015-05-11 NOTE — Progress Notes (Signed)
   Subjective:    Patient ID: [name], male    DOB: December 05, 1955, 59 y.o.   MRN: [id number]  Hypertension This is a chronic problem. The current episode started more than 1 year ago. Risk factors for coronary artery disease include male gender. Treatments tried: metoprolol. There are no compliance problems.    Patient has reflux. Notes improvement. While taking medication. Without it he has reemergence of symptoms.  He's been sticking with his iron supplement. Had fairly substantial anemia earlier in the year. In fact needed transfusions. No recent CBC done.  Continue 6 parents occasional challenges with his colostomy. Occasional cramping abdominal pain. No difficulty with constipation.  Had elevated heart rate. Now on metoprolol. Handling it well. No obvious side effects.  Review of Systems No headache no chest pain no back pain some knee discomfort no change in bowel habits    Objective:   Physical Exam  Alert vital stable blood pressure good on repeat HEENT normal lungs clear heart rare rhythm abdomen soft colostomy site good ankles trace edema      Assessment & Plan:  Impression 1 anemia status uncertain #2 hypertension good control #3 status post rectal cancer surgery dealing with some challenges with colostomy but overall good #4 hyperlipidemia status uncertain #5 reflux clinically stable plan maintain same dose of medications. Check lipids as. Check CBC status. Diet exercise discussed. Recheck as scheduled W[facility]

## 2015-05-12 LAB — CBC WITH DIFFERENTIAL/PLATELET
Basophils Absolute: 0 10*3/uL (ref 0.0–0.2)
Basos: 0 %
EOS (ABSOLUTE): 0.1 10*3/uL (ref 0.0–0.4)
EOS: 2 %
HEMATOCRIT: 45.7 % (ref 37.5–51.0)
Hemoglobin: 15.7 g/dL (ref 12.6–17.7)
IMMATURE GRANULOCYTES: 0 %
Immature Grans (Abs): 0 10*3/uL (ref 0.0–0.1)
LYMPHS: 19 %
Lymphocytes Absolute: 1.5 10*3/uL (ref 0.7–3.1)
MCH: 31.8 pg (ref 26.6–33.0)
MCHC: 34.4 g/dL (ref 31.5–35.7)
MCV: 93 fL (ref 79–97)
MONOCYTES: 8 %
Monocytes Absolute: 0.6 10*3/uL (ref 0.1–0.9)
NEUTROS PCT: 71 %
Neutrophils Absolute: 5.3 10*3/uL (ref 1.4–7.0)
Platelets: 289 10*3/uL (ref 150–379)
RBC: 4.94 x10E6/uL (ref 4.14–5.80)
RDW: 14.1 % (ref 12.3–15.4)
WBC: 7.5 10*3/uL (ref 3.4–10.8)

## 2015-05-12 LAB — BASIC METABOLIC PANEL
BUN/Creatinine Ratio: 20 (ref 9–20)
BUN: 19 mg/dL (ref 6–24)
CALCIUM: 9.5 mg/dL (ref 8.7–10.2)
CO2: 26 mmol/L (ref 18–29)
CREATININE: 0.94 mg/dL (ref 0.76–1.27)
Chloride: 101 mmol/L (ref 97–108)
GFR calc Af Amer: 103 mL/min/{1.73_m2} (ref >59)
GFR calc non Af Amer: 89 mL/min/{1.73_m2} (ref >59)
GLUCOSE: 91 mg/dL (ref 65–99)
Potassium: 4.8 mmol/L (ref 3.5–5.2)
Sodium: 140 mmol/L (ref 134–144)

## 2015-05-12 LAB — HEPATIC FUNCTION PANEL
ALT: 22 IU/L (ref 0–44)
AST: 17 IU/L (ref 0–40)
Albumin: 4.1 g/dL (ref 3.5–5.5)
Alkaline Phosphatase: 80 IU/L (ref 39–117)
Bilirubin Total: 0.4 mg/dL (ref 0.0–1.2)
Bilirubin, Direct: 0.14 mg/dL (ref 0.00–0.40)
TOTAL PROTEIN: 6.4 g/dL (ref 6.0–8.5)

## 2015-05-12 LAB — LIPID PANEL
Chol/HDL Ratio: 4 ratio units (ref 0.0–5.0)
Cholesterol, Total: 174 mg/dL (ref 100–199)
HDL: 43 mg/dL (ref >39)
LDL Calculated: 116 mg/dL — ABNORMAL HIGH (ref 0–99)
Triglycerides: 75 mg/dL (ref 0–149)
VLDL CHOLESTEROL CAL: 15 mg/dL (ref 5–40)

## 2015-05-17 ENCOUNTER — Encounter: Payer: Self-pay | Admitting: Family Medicine

## 2015-09-28 ENCOUNTER — Encounter: Payer: Self-pay | Admitting: Family Medicine

## 2015-09-28 ENCOUNTER — Ambulatory Visit (HOSPITAL_COMMUNITY)
Admission: RE | Admit: 2015-09-28 | Discharge: 2015-09-28 | Disposition: A | Discharge status: Home / Self Care | Payer: BLUE CROSS/BLUE SHIELD | Source: Ambulatory Visit | Attending: Family Medicine | Admitting: Family Medicine

## 2015-09-28 ENCOUNTER — Ambulatory Visit (INDEPENDENT_AMBULATORY_CARE_PROVIDER_SITE_OTHER): Payer: BLUE CROSS/BLUE SHIELD | Admitting: Family Medicine

## 2015-09-28 ENCOUNTER — Telehealth: Payer: Self-pay | Admitting: Family Medicine

## 2015-09-28 VITALS — BP 122/78 | Ht 64.0 in | Wt 192.0 lb

## 2015-09-28 DIAGNOSIS — M1990 Unspecified osteoarthritis, unspecified site: Secondary | ICD-10-CM | POA: Diagnosis not present

## 2015-09-28 DIAGNOSIS — M25552 Pain in left hip: Secondary | ICD-10-CM | POA: Diagnosis not present

## 2015-09-28 DIAGNOSIS — M1612 Unilateral primary osteoarthritis, left hip: Secondary | ICD-10-CM

## 2015-09-28 MED ORDER — HYDROCODONE-ACETAMINOPHEN 5-325 MG PO TABS: 1.0000 | ORAL_TABLET | Freq: Four times a day (QID) | ORAL | Status: DC | PRN

## 2015-09-28 MED ORDER — ETODOLAC 400 MG PO TABS: 400.0000 mg | ORAL_TABLET | Freq: Two times a day (BID) | ORAL | Status: DC

## 2015-09-28 NOTE — Telephone Encounter (Signed)
ERROR

## 2015-09-28 NOTE — Progress Notes (Signed)
   Subjective:    Patient ID: [name], male    DOB: 1956-04-01, 60 y.o.   MRN: [id number]  HPI  Patient arrives with c/o left hip and leg pain- gives away on him and he has fell several days. Because of the pain. Progressive pain left hip. Patient works as a Teacher, adult education. Often put substantial stress on hip.   Of note had a flare of hip pain a couple years ago. X-ray then showed fairly severe degenerative disease. At that time patient was facing: cancer surgery and he did not want to delve into this is    using over-the-counter Aleve when necessary  Review of Systems  some knee pain minimal back pain ROS otherwise negative    Objective:   Physical Exam  alert vitals stable lungs clear. Heart regular in rhythm hip positive substantial pain with internal and external rotation and flexion       Assessment & Plan:   impression progressive hip arthritis plan [place] for next couple weeks. Nighttime hydrocodone. X-ray hip.  Needs orthopedic referral

## 2015-10-06 ENCOUNTER — Telehealth: Payer: Self-pay | Admitting: *Deleted

## 2015-10-06 ENCOUNTER — Encounter: Payer: Self-pay | Admitting: Orthopaedic Surgery

## 2015-10-06 ENCOUNTER — Other Ambulatory Visit: Payer: Self-pay | Admitting: *Deleted

## 2015-10-06 ENCOUNTER — Ambulatory Visit (INDEPENDENT_AMBULATORY_CARE_PROVIDER_SITE_OTHER): Payer: BLUE CROSS/BLUE SHIELD | Admitting: Orthopaedic Surgery

## 2015-10-06 VITALS — BP 164/94 | HR 57 | Temp 97.7°F | Resp 16 | Ht 64.0 in | Wt 188.0 lb

## 2015-10-06 DIAGNOSIS — M87052 Idiopathic aseptic necrosis of left femur: Secondary | ICD-10-CM

## 2015-10-06 DIAGNOSIS — M21769 Unequal limb length (acquired), unspecified tibia and fibula: Secondary | ICD-10-CM | POA: Diagnosis not present

## 2015-10-06 NOTE — Telephone Encounter (Addendum)
Referral and office notes faxed to Dr. Ninfa Linden at [facility]. Awaiting appointment.  Patient has an appointment 10/20/15. Patient aware.

## 2015-10-06 NOTE — Patient Instructions (Signed)
REFERRAL TO DR. [place]
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
## 2015-10-29 ENCOUNTER — Other Ambulatory Visit: Payer: Self-pay | Admitting: Family Medicine

## 2015-10-29 ENCOUNTER — Telehealth: Payer: Self-pay | Admitting: Family Medicine

## 2015-10-29 MED ORDER — ETODOLAC 400 MG PO TABS: 400.0000 mg | ORAL_TABLET | Freq: Two times a day (BID) | ORAL | Status: DC

## 2015-10-29 MED ORDER — HYDROCODONE-ACETAMINOPHEN 5-325 MG PO TABS: 1.0000 | ORAL_TABLET | Freq: Four times a day (QID) | ORAL | Status: DC | PRN

## 2015-10-29 NOTE — Telephone Encounter (Signed)
Patient has run out of the etodolac and hydrocodone that he was prescribed on 09/28/15 for hip pain.  He wants to know if these can be refilled to [facility].  Dr. Marnee Spring will be doing his hip replacement November 19, 2015.

## 2015-10-29 NOTE — Telephone Encounter (Signed)
Prescriptions were refilled hydrocodone and load 9. Pharmacy to call patient once they are filled

## 2015-11-04 ENCOUNTER — Other Ambulatory Visit: Payer: Self-pay | Admitting: Physician Assistant

## 2015-11-09 ENCOUNTER — Ambulatory Visit (INDEPENDENT_AMBULATORY_CARE_PROVIDER_SITE_OTHER): Payer: BLUE CROSS/BLUE SHIELD | Admitting: Family Medicine

## 2015-11-09 ENCOUNTER — Encounter: Payer: Self-pay | Admitting: Family Medicine

## 2015-11-09 VITALS — BP 128/90 | Ht 64.0 in | Wt 190.0 lb

## 2015-11-09 DIAGNOSIS — I10 Essential (primary) hypertension: Secondary | ICD-10-CM

## 2015-11-09 DIAGNOSIS — C20 Malignant neoplasm of rectum: Secondary | ICD-10-CM | POA: Diagnosis not present

## 2015-11-09 DIAGNOSIS — K219 Gastro-esophageal reflux disease without esophagitis: Secondary | ICD-10-CM | POA: Diagnosis not present

## 2015-11-09 DIAGNOSIS — M1612 Unilateral primary osteoarthritis, left hip: Secondary | ICD-10-CM

## 2015-11-09 DIAGNOSIS — M1990 Unspecified osteoarthritis, unspecified site: Secondary | ICD-10-CM | POA: Diagnosis not present

## 2015-11-09 DIAGNOSIS — M25552 Pain in left hip: Secondary | ICD-10-CM

## 2015-11-09 MED ORDER — OMEPRAZOLE 20 MG PO CPDR: 20.0000 mg | DELAYED_RELEASE_CAPSULE | Freq: Two times a day (BID) | ORAL | Status: DC

## 2015-11-09 MED ORDER — METOPROLOL TARTRATE 25 MG PO TABS: 25.0000 mg | ORAL_TABLET | Freq: Two times a day (BID) | ORAL | Status: DC

## 2015-11-09 NOTE — Progress Notes (Signed)
   Subjective:    Patient ID: [name], male    DOB: October 08, 1955, 60 y.o.   MRN: [id number] Patient arrives office with numerous concerns Hypertension This is a chronic problem. The current episode started more than 1 year ago. The problem has been gradually improving since onset. There are no associated agents to hypertension. There are no known risk factors for coronary artery disease. Treatments tried: metoprolol. The current treatment provides moderate improvement. There are no compliance problems.     Compliant with his blood pressure medicine. No obvious side effects.  Continues to handle his osteocyte well. Status post colostomy. 4 lower colon cancer. Originally had challenges. Handling things well now.  Ongoing fairly severe hip pain. See prior phone message. Still using pain medication. Anticipating hip surgery shortly. Patient states that he has no concerns at this time.    Review of Systems No headache, no major weight loss or weight gain, no chest pain no back pain abdominal pain no change in bowel habits complete ROS otherwise negative     Objective:   Physical Exam  Alert moderate obesity vitals stable HEENT normal lungs clear heart rhythm abdomen ostium site exam and intact left hip severe rotational discomfort      Assessment & Plan:  Impression 1 progressive hip arthritis element of necrosis with pending surgery #2 hypertension good control maintain same meds #3 reflux clinically stable #4 status post domino cancer patient wondered next colonoscopy is due this 2 years now plan all medications refilled diet exercise discussed maintain same course of treatment follow-up as scheduled [facility]

## 2015-11-10 ENCOUNTER — Encounter (HOSPITAL_COMMUNITY)
Admission: RE | Admit: 2015-11-10 | Discharge: 2015-11-10 | Disposition: A | Discharge status: Home / Self Care | Payer: BLUE CROSS/BLUE SHIELD | Source: Ambulatory Visit | Attending: Orthopaedic Surgery | Admitting: Orthopaedic Surgery

## 2015-11-10 ENCOUNTER — Encounter (HOSPITAL_COMMUNITY): Payer: Self-pay

## 2015-11-10 ENCOUNTER — Other Ambulatory Visit: Payer: Self-pay

## 2015-11-10 DIAGNOSIS — Z01818 Encounter for other preprocedural examination: Secondary | ICD-10-CM | POA: Insufficient documentation

## 2015-11-10 DIAGNOSIS — Z0183 Encounter for blood typing: Secondary | ICD-10-CM | POA: Insufficient documentation

## 2015-11-10 DIAGNOSIS — Z01812 Encounter for preprocedural laboratory examination: Secondary | ICD-10-CM | POA: Insufficient documentation

## 2015-11-10 DIAGNOSIS — M1652 Unilateral post-traumatic osteoarthritis, left hip: Secondary | ICD-10-CM | POA: Diagnosis not present

## 2015-11-10 DIAGNOSIS — I440 Atrioventricular block, first degree: Secondary | ICD-10-CM | POA: Diagnosis not present

## 2015-11-10 HISTORY — DX: Other skin changes: R23.8

## 2015-11-10 LAB — CBC
HEMATOCRIT: 44.6 % (ref 39.0–52.0)
HEMOGLOBIN: 14.5 g/dL (ref 13.0–17.0)
MCH: 31.3 pg (ref 26.0–34.0)
MCHC: 32.5 g/dL (ref 30.0–36.0)
MCV: 96.1 fL (ref 78.0–100.0)
PLATELETS: 316 10*3/uL (ref 150–400)
RBC: 4.64 MIL/uL (ref 4.22–5.81)
RDW: 13.7 % (ref 11.5–15.5)
WBC: 7 10*3/uL (ref 4.0–10.5)

## 2015-11-10 LAB — BASIC METABOLIC PANEL
ANION GAP: 6 (ref 5–15)
BUN: 27 mg/dL — ABNORMAL HIGH (ref 6–20)
CHLORIDE: 107 mmol/L (ref 101–111)
CO2: 25 mmol/L (ref 22–32)
Calcium: 9 mg/dL (ref 8.9–10.3)
Creatinine, Ser: 0.82 mg/dL (ref 0.61–1.24)
GFR calc non Af Amer: >60 mL/min (ref >60)
Glucose, Bld: 92 mg/dL (ref 65–99)
POTASSIUM: 4.6 mmol/L (ref 3.5–5.1)
SODIUM: 138 mmol/L (ref 135–145)

## 2015-11-10 LAB — SURGICAL PCR SCREEN
MRSA, PCR: NEGATIVE
STAPHYLOCOCCUS AUREUS: POSITIVE — AB

## 2015-11-10 NOTE — Patient Instructions (Addendum)
[name]  11/10/2015   Your procedure is scheduled on: 11-19-15   Report to [facility] Main  Entrance take [facility]  elevators to 3rd floor to  [place] at   1130 AM.  Call this number if you have problems the morning of surgery [phone]   Remember: ONLY 1 PERSON MAY GO WITH YOU TO SHORT STAY TO GET  READY MORNING OF [place].  Do not eat food or drink liquids :After Midnight. Exception Clear Liquids 12 midnight to 0800 AM, then nothing.     Take these medicines the morning of surgery with A SIP OF WATER: Metoprolol. Omeprazole. Hydrocodone-if need) DO NOT TAKE ANY DIABETIC MEDICATIONS DAY OF YOUR SURGERY                               You may not have any metal on your body including hair pins and              piercings  Do not wear jewelry, make-up, lotions, powders or perfumes, deodorant             Do not wear nail polish.  Do not shave  48 hours prior to surgery.              Men may shave face and neck.   Do not bring valuables to the hospital. [name].  Contacts, dentures or bridgework may not be worn into surgery.  Leave suitcase in the car. After surgery it may be brought to your room.     Patients discharged the day of surgery will not be allowed to drive home.  Name and phone number of your driver: [name] -spouse [phone] cell  Special Instructions: N/A              Please read over the following fact sheets you were given: _____________________________________________________________________             [facility] - Preparing for Surgery Before surgery, you can play an important role.  Because skin is not sterile, your skin needs to be as free of germs as possible.  You can reduce the number of germs on your skin by washing with CHG (chlorahexidine gluconate) soap before surgery.  CHG is an antiseptic cleaner which kills germs and bonds with the skin to continue killing  germs even after washing. Please DO NOT use if you have an allergy to CHG or antibacterial soaps.  If your skin becomes reddened/irritated stop using the CHG and inform your nurse when you arrive at Short Stay. Do not shave (including legs and underarms) for at least 48 hours prior to the first CHG shower.  You may shave your face/neck. Please follow these instructions carefully:  1.  Shower with CHG Soap the night before surgery and the  morning of Surgery.  2.  If you choose to wash your hair, wash your hair first as usual with your  normal  shampoo.  3.  After you shampoo, rinse your hair and body thoroughly to remove the  shampoo.                           4.  Use CHG as  you would any other liquid soap.  You can apply chg directly  to the skin and wash                       Gently with a scrungie or clean washcloth.  5.  Apply the CHG Soap to your body ONLY FROM THE NECK DOWN.   Do not use on face/ open                           Wound or open sores. Avoid contact with eyes, ears mouth and genitals (private parts).                       Wash face,  Genitals (private parts) with your normal soap.             6.  Wash thoroughly, paying special attention to the area where your surgery  will be performed.  7.  Thoroughly rinse your body with warm water from the neck down.  8.  DO NOT shower/wash with your normal soap after using and rinsing off  the CHG Soap.                9.  Pat yourself dry with a clean towel.            10.  Wear clean pajamas.            11.  Place clean sheets on your bed the night of your first shower and do not  sleep with pets. Day of Surgery : Do not apply any lotions/deodorants the morning of surgery.  Please wear clean clothes to the hospital/surgery center.  FAILURE TO FOLLOW THESE INSTRUCTIONS MAY RESULT IN THE CANCELLATION OF YOUR SURGERY PATIENT SIGNATURE_________________________________  NURSE  SIGNATURE__________________________________  ________________________________________________________________________   [name]  An incentive spirometer is a tool that can help keep your lungs clear and active. This tool measures how well you are filling your lungs with each breath. Taking long deep breaths may help reverse or decrease the chance of developing breathing (pulmonary) problems (especially infection) following:  A long period of time when you are unable to move or be active. BEFORE THE PROCEDURE   If the spirometer includes an indicator to show your best effort, your nurse or respiratory therapist will set it to a desired goal.  If possible, sit up straight or lean slightly forward. Try not to slouch.  Hold the incentive spirometer in an upright position. INSTRUCTIONS FOR USE  1. Sit on the edge of your bed if possible, or sit up as far as you can in bed or on a chair. 2. Hold the incentive spirometer in an upright position. 3. Breathe out normally. 4. Place the mouthpiece in your mouth and seal your lips tightly around it. 5. Breathe in slowly and as deeply as possible, raising the piston or the ball toward the top of the column. 6. Hold your breath for 3-5 seconds or for as long as possible. Allow the piston or ball to fall to the bottom of the column. 7. Remove the mouthpiece from your mouth and breathe out normally. 8. Rest for a few seconds and repeat Steps 1 through 7 at least 10 times every 1-2 hours when you are awake. Take your time and take a few normal breaths between deep breaths. 9. The spirometer may include an indicator to show your best effort. Use the  indicator as a goal to work toward during each repetition. 10. After each set of 10 deep breaths, practice coughing to be sure your lungs are clear. If you have an incision (the cut made at the time of surgery), support your incision when coughing by placing a pillow or rolled up towels firmly  against it. Once you are able to get out of bed, walk around indoors and cough well. You may stop using the incentive spirometer when instructed by your caregiver.  RISKS AND COMPLICATIONS  Take your time so you do not get dizzy or light-headed.  If you are in pain, you may need to take or ask for pain medication before doing incentive spirometry. It is harder to take a deep breath if you are having pain. AFTER USE  Rest and breathe slowly and easily.  It can be helpful to keep track of a log of your progress. Your caregiver can provide you with a simple table to help with this. If you are using the spirometer at home, follow these instructions: Will IF:   You are having difficultly using the spirometer.  You have trouble using the spirometer as often as instructed.  Your pain medication is not giving enough relief while using the spirometer.  You develop fever of 100.5 F (38.1 C) or higher. SEEK IMMEDIATE MEDICAL CARE IF:   You cough up bloody sputum that had not been present before.  You develop fever of 102 F (38.9 C) or greater.  You develop worsening pain at or near the incision site. MAKE SURE YOU:   Understand these instructions.  Will watch your condition.  Will get help right away if you are not doing well or get worse. Document Released: 12/18/2006 Document Revised: 10/30/2011 Document Reviewed: 02/18/2007 [facility] Patient Information 2014 [place], [place].   ________________________________________________________________________

## 2015-11-10 NOTE — Progress Notes (Signed)
11-10-15 [place] contacted pt has visible reddened irritation around ostomy bag. Dawn gave suggestive measures for pt to take- due to inability to see pt today. Pt. To contact [place] MD if condition worsens.

## 2015-11-10 NOTE — Pre-Procedure Instructions (Signed)
EKG done today.

## 2015-11-10 NOTE — Pre-Procedure Instructions (Signed)
11-10-15 1540 Pt. Aware of Positive Staph aureus PCR- to use Mupirocin as directed. Note per Epic to fax Dr. Trevor Mace office. Note Bun of viewable labs done today.

## 2015-11-15 ENCOUNTER — Other Ambulatory Visit: Payer: Self-pay | Admitting: Family Medicine

## 2015-11-19 ENCOUNTER — Encounter (HOSPITAL_COMMUNITY)
Admission: RE | Disposition: A | Discharge status: Home Health | Payer: Self-pay | Source: Ambulatory Visit | Attending: Orthopaedic Surgery

## 2015-11-19 ENCOUNTER — Inpatient Hospital Stay (HOSPITAL_COMMUNITY): Payer: BLUE CROSS/BLUE SHIELD

## 2015-11-19 ENCOUNTER — Inpatient Hospital Stay (HOSPITAL_COMMUNITY): Payer: BLUE CROSS/BLUE SHIELD | Admitting: Anesthesiology

## 2015-11-19 ENCOUNTER — Encounter (HOSPITAL_COMMUNITY): Payer: Self-pay | Admitting: *Deleted

## 2015-11-19 ENCOUNTER — Inpatient Hospital Stay (HOSPITAL_COMMUNITY)
Admission: RE | Admit: 2015-11-19 | Discharge: 2015-11-22 | DRG: 470 | Disposition: A | Discharge status: Home Health | Payer: BLUE CROSS/BLUE SHIELD | Source: Ambulatory Visit | Attending: Orthopaedic Surgery | Admitting: Orthopaedic Surgery

## 2015-11-19 DIAGNOSIS — Z87891 Personal history of nicotine dependence: Secondary | ICD-10-CM | POA: Diagnosis not present

## 2015-11-19 DIAGNOSIS — K219 Gastro-esophageal reflux disease without esophagitis: Secondary | ICD-10-CM | POA: Diagnosis present

## 2015-11-19 DIAGNOSIS — Z933 Colostomy status: Secondary | ICD-10-CM

## 2015-11-19 DIAGNOSIS — M1612 Unilateral primary osteoarthritis, left hip: Secondary | ICD-10-CM

## 2015-11-19 DIAGNOSIS — Z419 Encounter for procedure for purposes other than remedying health state, unspecified: Secondary | ICD-10-CM

## 2015-11-19 DIAGNOSIS — Z01812 Encounter for preprocedural laboratory examination: Secondary | ICD-10-CM

## 2015-11-19 DIAGNOSIS — D62 Acute posthemorrhagic anemia: Secondary | ICD-10-CM | POA: Diagnosis not present

## 2015-11-19 DIAGNOSIS — M12552 Traumatic arthropathy, left hip: Principal | ICD-10-CM | POA: Diagnosis present

## 2015-11-19 DIAGNOSIS — Z85048 Personal history of other malignant neoplasm of rectum, rectosigmoid junction, and anus: Secondary | ICD-10-CM

## 2015-11-19 DIAGNOSIS — M1652 Unilateral post-traumatic osteoarthritis, left hip: Secondary | ICD-10-CM

## 2015-11-19 DIAGNOSIS — M25552 Pain in left hip: Secondary | ICD-10-CM | POA: Diagnosis present

## 2015-11-19 DIAGNOSIS — I10 Essential (primary) hypertension: Secondary | ICD-10-CM | POA: Diagnosis present

## 2015-11-19 DIAGNOSIS — Z96642 Presence of left artificial hip joint: Secondary | ICD-10-CM

## 2015-11-19 HISTORY — PX: TOTAL HIP ARTHROPLASTY: SHX124

## 2015-11-19 LAB — TYPE AND SCREEN
ABO/RH(D): A NEG
Antibody Screen: NEGATIVE

## 2015-11-19 SURGERY — ARTHROPLASTY, HIP, TOTAL, ANTERIOR APPROACH
Anesthesia: Spinal | Laterality: Left

## 2015-11-19 MED ORDER — DIPHENHYDRAMINE HCL 12.5 MG/5ML PO ELIX: 12.5000 mg | ORAL_SOLUTION | ORAL | Status: DC | PRN

## 2015-11-19 MED ORDER — METHOCARBAMOL 500 MG PO TABS
500.0000 mg | ORAL_TABLET | Freq: Four times a day (QID) | ORAL | Status: DC | PRN
  Administered 2015-11-19 – 2015-11-22 (×7): 500 mg via ORAL
  Filled 2015-11-19 (×7): qty 1

## 2015-11-19 MED ORDER — OXYCODONE HCL 5 MG PO TABS
5.0000 mg | ORAL_TABLET | ORAL | Status: DC | PRN
  Administered 2015-11-19: 10 mg via ORAL
  Administered 2015-11-19 – 2015-11-20 (×4): 5 mg via ORAL
  Administered 2015-11-20 (×2): 10 mg via ORAL
  Administered 2015-11-20: 5 mg via ORAL
  Administered 2015-11-21 – 2015-11-22 (×9): 10 mg via ORAL
  Filled 2015-11-19: qty 2
  Filled 2015-11-19: qty 1
  Filled 2015-11-19 (×2): qty 2
  Filled 2015-11-19 (×2): qty 1
  Filled 2015-11-19: qty 2
  Filled 2015-11-19: qty 1
  Filled 2015-11-19 (×9): qty 2
  Filled 2015-11-19: qty 1

## 2015-11-19 MED ORDER — ONDANSETRON HCL 4 MG/2ML IJ SOLN: 4.0000 mg | Freq: Once | INTRAMUSCULAR | Status: DC | PRN

## 2015-11-19 MED ORDER — CHLORHEXIDINE GLUCONATE 4 % EX LIQD: 60.0000 mL | Freq: Once | CUTANEOUS | Status: DC

## 2015-11-19 MED ORDER — FENTANYL CITRATE (PF) 100 MCG/2ML IJ SOLN
INTRAMUSCULAR | Status: AC
  Filled 2015-11-19: qty 2

## 2015-11-19 MED ORDER — MIDAZOLAM HCL 5 MG/5ML IJ SOLN
INTRAMUSCULAR | Status: DC | PRN
  Administered 2015-11-19 (×2): 1 mg via INTRAVENOUS

## 2015-11-19 MED ORDER — BUPIVACAINE IN DEXTROSE 0.75-8.25 % IT SOLN
INTRATHECAL | Status: DC | PRN
  Administered 2015-11-19: 2 mL via INTRATHECAL

## 2015-11-19 MED ORDER — CEFAZOLIN SODIUM-DEXTROSE 2-3 GM-% IV SOLR
INTRAVENOUS | Status: AC
  Filled 2015-11-19: qty 50

## 2015-11-19 MED ORDER — METHOCARBAMOL 1000 MG/10ML IJ SOLN
500.0000 mg | Freq: Four times a day (QID) | INTRAVENOUS | Status: DC | PRN
  Administered 2015-11-19: 500 mg via INTRAVENOUS
  Filled 2015-11-19 (×2): qty 5

## 2015-11-19 MED ORDER — ONDANSETRON HCL 4 MG/2ML IJ SOLN: 4.0000 mg | Freq: Four times a day (QID) | INTRAMUSCULAR | Status: DC | PRN

## 2015-11-19 MED ORDER — ACETAMINOPHEN 10 MG/ML IV SOLN
INTRAVENOUS | Status: DC | PRN
  Administered 2015-11-19: 1000 mg via INTRAVENOUS

## 2015-11-19 MED ORDER — MIDAZOLAM HCL 2 MG/2ML IJ SOLN
INTRAMUSCULAR | Status: AC
  Filled 2015-11-19: qty 2

## 2015-11-19 MED ORDER — ONDANSETRON HCL 4 MG PO TABS
4.0000 mg | ORAL_TABLET | Freq: Four times a day (QID) | ORAL | Status: DC | PRN
  Administered 2015-11-20 – 2015-11-22 (×2): 4 mg via ORAL
  Filled 2015-11-19 (×2): qty 1

## 2015-11-19 MED ORDER — ACETAMINOPHEN 10 MG/ML IV SOLN: 1000.0000 mg | Freq: Once | INTRAVENOUS | Status: DC

## 2015-11-19 MED ORDER — CEFAZOLIN SODIUM-DEXTROSE 2-4 GM/100ML-% IV SOLN
2.0000 g | INTRAVENOUS | Status: DC
  Filled 2015-11-19: qty 100

## 2015-11-19 MED ORDER — PANTOPRAZOLE SODIUM 40 MG PO TBEC
40.0000 mg | DELAYED_RELEASE_TABLET | Freq: Every day | ORAL | Status: DC
  Administered 2015-11-20 – 2015-11-22 (×3): 40 mg via ORAL
  Filled 2015-11-19 (×3): qty 1

## 2015-11-19 MED ORDER — METOCLOPRAMIDE HCL 5 MG/ML IJ SOLN
5.0000 mg | Freq: Three times a day (TID) | INTRAMUSCULAR | Status: DC | PRN
  Administered 2015-11-20: 10 mg via INTRAVENOUS
  Filled 2015-11-19: qty 2

## 2015-11-19 MED ORDER — HYDROMORPHONE HCL 1 MG/ML IJ SOLN: 1.0000 mg | INTRAMUSCULAR | Status: DC | PRN

## 2015-11-19 MED ORDER — SODIUM CHLORIDE 0.9 % IR SOLN
Status: DC | PRN
  Administered 2015-11-19: 1000 mL

## 2015-11-19 MED ORDER — CEFAZOLIN SODIUM-DEXTROSE 2-3 GM-% IV SOLR
INTRAVENOUS | Status: DC | PRN
  Administered 2015-11-19: 2 g via INTRAVENOUS

## 2015-11-19 MED ORDER — PROPOFOL 10 MG/ML IV BOLUS
INTRAVENOUS | Status: AC
  Filled 2015-11-19: qty 40

## 2015-11-19 MED ORDER — TRANEXAMIC ACID 1000 MG/10ML IV SOLN
1000.0000 mg | INTRAVENOUS | Status: AC
  Administered 2015-11-19: 1000 mg via INTRAVENOUS
  Filled 2015-11-19: qty 10

## 2015-11-19 MED ORDER — LACTATED RINGERS IV SOLN
INTRAVENOUS | Status: DC | PRN
  Administered 2015-11-19 (×3): via INTRAVENOUS

## 2015-11-19 MED ORDER — VITAMIN C 500 MG PO TABS
500.0000 mg | ORAL_TABLET | Freq: Two times a day (BID) | ORAL | Status: DC
  Administered 2015-11-19 – 2015-11-22 (×6): 500 mg via ORAL
  Filled 2015-11-19 (×8): qty 1

## 2015-11-19 MED ORDER — MENTHOL 3 MG MT LOZG
1.0000 | LOZENGE | OROMUCOSAL | Status: DC | PRN
  Administered 2015-11-20: 3 mg via ORAL
  Filled 2015-11-19: qty 9

## 2015-11-19 MED ORDER — PROPOFOL 500 MG/50ML IV EMUL
INTRAVENOUS | Status: DC | PRN
  Administered 2015-11-19: 100 ug/kg/min via INTRAVENOUS

## 2015-11-19 MED ORDER — METOPROLOL TARTRATE 25 MG PO TABS
25.0000 mg | ORAL_TABLET | Freq: Two times a day (BID) | ORAL | Status: DC
  Administered 2015-11-19 – 2015-11-22 (×6): 25 mg via ORAL
  Filled 2015-11-19 (×7): qty 1

## 2015-11-19 MED ORDER — ACETAMINOPHEN 10 MG/ML IV SOLN
INTRAVENOUS | Status: AC
  Filled 2015-11-19: qty 100

## 2015-11-19 MED ORDER — PROPOFOL 10 MG/ML IV BOLUS
INTRAVENOUS | Status: AC
  Filled 2015-11-19: qty 20

## 2015-11-19 MED ORDER — FENTANYL CITRATE (PF) 100 MCG/2ML IJ SOLN
INTRAMUSCULAR | Status: DC | PRN
  Administered 2015-11-19 (×2): 50 ug via INTRAVENOUS

## 2015-11-19 MED ORDER — SODIUM CHLORIDE 0.9 % IV SOLN
INTRAVENOUS | Status: DC
  Administered 2015-11-19: 17:00:00 via INTRAVENOUS

## 2015-11-19 MED ORDER — TURMERIC 500 MG PO CAPS: 1.0000 | ORAL_CAPSULE | Freq: Two times a day (BID) | ORAL | Status: DC

## 2015-11-19 MED ORDER — PHENYLEPHRINE HCL 10 MG/ML IJ SOLN
INTRAMUSCULAR | Status: DC | PRN
  Administered 2015-11-19: 80 ug via INTRAVENOUS
  Administered 2015-11-19 (×11): 40 ug via INTRAVENOUS
  Administered 2015-11-19: 80 ug via INTRAVENOUS
  Administered 2015-11-19 (×3): 40 ug via INTRAVENOUS

## 2015-11-19 MED ORDER — PHENOL 1.4 % MT LIQD: 1.0000 | OROMUCOSAL | Status: DC | PRN

## 2015-11-19 MED ORDER — ASPIRIN EC 325 MG PO TBEC
325.0000 mg | DELAYED_RELEASE_TABLET | Freq: Two times a day (BID) | ORAL | Status: DC
  Administered 2015-11-19 – 2015-11-22 (×6): 325 mg via ORAL
  Filled 2015-11-19 (×8): qty 1

## 2015-11-19 MED ORDER — METOCLOPRAMIDE HCL 10 MG PO TABS: 5.0000 mg | ORAL_TABLET | Freq: Three times a day (TID) | ORAL | Status: DC | PRN

## 2015-11-19 MED ORDER — ACETAMINOPHEN 325 MG PO TABS
650.0000 mg | ORAL_TABLET | Freq: Four times a day (QID) | ORAL | Status: DC | PRN
  Administered 2015-11-20 – 2015-11-22 (×3): 650 mg via ORAL
  Filled 2015-11-19 (×4): qty 2

## 2015-11-19 MED ORDER — CEFAZOLIN SODIUM 1-5 GM-% IV SOLN
1.0000 g | Freq: Four times a day (QID) | INTRAVENOUS | Status: AC
  Administered 2015-11-19 – 2015-11-20 (×2): 1 g via INTRAVENOUS
  Filled 2015-11-19 (×2): qty 50

## 2015-11-19 MED ORDER — ACETAMINOPHEN 650 MG RE SUPP: 650.0000 mg | Freq: Four times a day (QID) | RECTAL | Status: DC | PRN

## 2015-11-19 MED ORDER — FENTANYL CITRATE (PF) 100 MCG/2ML IJ SOLN: 25.0000 ug | INTRAMUSCULAR | Status: DC | PRN

## 2015-11-19 MED ORDER — ZOLPIDEM TARTRATE 5 MG PO TABS
5.0000 mg | ORAL_TABLET | Freq: Every evening | ORAL | Status: DC | PRN
  Administered 2015-11-22: 5 mg via ORAL
  Filled 2015-11-19: qty 1

## 2015-11-19 SURGICAL SUPPLY — 35 items
BAG ZIPLOCK 12X15 (MISCELLANEOUS) IMPLANT
BENZOIN TINCTURE PRP APPL 2/3 (GAUZE/BANDAGES/DRESSINGS) IMPLANT
BLADE SAW SGTL 18X1.27X75 (BLADE) ×2 IMPLANT
BLADE SAW SGTL 18X1.27X75MM (BLADE) ×1
CAPT HIP TOTAL 2 ×3 IMPLANT
CELLS DAT CNTRL 66122 CELL SVR (MISCELLANEOUS) ×1 IMPLANT
CLOSURE WOUND 1/2 X4 (GAUZE/BANDAGES/DRESSINGS) ×1
CLOTH BEACON ORANGE TIMEOUT ST (SAFETY) ×3 IMPLANT
DRAPE STERI IOBAN 125X83 (DRAPES) ×3 IMPLANT
DRAPE U-SHAPE 47X51 STRL (DRAPES) ×6 IMPLANT
DRSG AQUACEL AG ADV 3.5X10 (GAUZE/BANDAGES/DRESSINGS) ×3 IMPLANT
DURAPREP 26ML APPLICATOR (WOUND CARE) ×3 IMPLANT
ELECT REM PT RETURN 9FT ADLT (ELECTROSURGICAL) ×3
ELECTRODE REM PT RTRN 9FT ADLT (ELECTROSURGICAL) ×1 IMPLANT
GAUZE XEROFORM 1X8 LF (GAUZE/BANDAGES/DRESSINGS) ×3 IMPLANT
GLOVE BIO SURGEON STRL SZ7.5 (GLOVE) ×3 IMPLANT
GLOVE BIOGEL PI IND STRL 8 (GLOVE) ×2 IMPLANT
GLOVE BIOGEL PI INDICATOR 8 (GLOVE) ×4
GLOVE ECLIPSE 8.0 STRL XLNG CF (GLOVE) ×3 IMPLANT
GOWN STRL REUS W/TWL XL LVL3 (GOWN DISPOSABLE) ×6 IMPLANT
HANDPIECE INTERPULSE COAX TIP (DISPOSABLE) ×2
HOLDER FOLEY CATH W/STRAP (MISCELLANEOUS) ×3 IMPLANT
PACK ANTERIOR HIP CUSTOM (KITS) ×3 IMPLANT
RTRCTR WOUND ALEXIS 18CM MED (MISCELLANEOUS) ×3
SET HNDPC FAN SPRY TIP SCT (DISPOSABLE) ×1 IMPLANT
STAPLER VISISTAT 35W (STAPLE) IMPLANT
STRIP CLOSURE SKIN 1/2X4 (GAUZE/BANDAGES/DRESSINGS) ×2 IMPLANT
SUT ETHIBOND NAB CT1 #1 30IN (SUTURE) ×3 IMPLANT
SUT MNCRL AB 4-0 PS2 18 (SUTURE) IMPLANT
SUT VIC AB 0 CT1 36 (SUTURE) ×3 IMPLANT
SUT VIC AB 1 CT1 36 (SUTURE) ×3 IMPLANT
SUT VIC AB 2-0 CT1 27 (SUTURE) ×4
SUT VIC AB 2-0 CT1 TAPERPNT 27 (SUTURE) ×2 IMPLANT
TRAY FOLEY W/METER SILVER 14FR (SET/KITS/TRAYS/PACK) ×3 IMPLANT
TRAY FOLEY W/METER SILVER 16FR (SET/KITS/TRAYS/PACK) ×3 IMPLANT

## 2015-11-19 NOTE — Anesthesia Procedure Notes (Signed)
Spinal Patient location during procedure: OR Staffing Anesthesiologist: Catalina Gravel Performed by: anesthesiologist  Preanesthetic Checklist Completed: patient identified, surgical consent, pre-op evaluation, timeout performed, IV checked, risks and benefits discussed and monitors and equipment checked Spinal Block Patient position: sitting Prep: site prepped and draped and DuraPrep Patient monitoring: continuous pulse ox and blood pressure Approach: midline Location: L3-4 Needle Needle type: Quincke  Needle gauge: 22 G Additional Notes Functioning IV was confirmed and monitors were applied. Sterile prep and drape, including hand hygiene, mask and sterile gloves were used. The patient was positioned and the spine was prepped. The skin was anesthetized with lidocaine.  Free flow of clear CSF was obtained prior to injecting local anesthetic into the CSF.  The spinal needle aspirated freely following injection.  The needle was carefully withdrawn.  The patient tolerated the procedure well. Consent was obtained prior to procedure with all questions answered and concerns addressed. Risks including but not limited to bleeding, infection, nerve damage, paralysis, failed block, inadequate analgesia, allergic reaction, high spinal, itching and headache were discussed and the patient wished to proceed.   Connor Morn, MD

## 2015-11-19 NOTE — Anesthesia Postprocedure Evaluation (Signed)
Anesthesia Post Note  Patient: [name]  Procedure(s) Performed: Procedure(s) (LRB): LEFT TOTAL HIP ARTHROPLASTY ANTERIOR APPROACH (Left)  Patient location during evaluation: PACU Anesthesia Type: Spinal Level of consciousness: oriented and awake and alert Pain management: pain level controlled Vital Signs Assessment: post-procedure vital signs reviewed and stable Respiratory status: spontaneous breathing, respiratory function stable and patient connected to nasal cannula oxygen Cardiovascular status: blood pressure returned to baseline and stable Postop Assessment: no headache, no backache, spinal receding, patient able to bend at knees and no signs of nausea or vomiting Anesthetic complications: no    Last Vitals:  Filed Vitals:   11/19/15 1649 11/19/15 1748  BP: 117/73 113/69  Pulse: 54 64  Temp: 36.5 C 36.8 C  Resp: 16 18    Last Pain:  Filed Vitals:   11/19/15 1748  PainSc: [place] Turk

## 2015-11-19 NOTE — Transfer of Care (Signed)
Immediate Anesthesia Transfer of Care Note  Patient: [name]  Procedure(s) Performed: Procedure(s): LEFT TOTAL HIP ARTHROPLASTY ANTERIOR APPROACH (Left)  Patient Location: PACU  Anesthesia Type:Spinal  Level of Consciousness: awake, alert , oriented and patient cooperative  Airway & Oxygen Therapy: Patient Spontanous Breathing and Patient connected to face mask oxygen  Post-op Assessment: Report given to RN and Post -op Vital signs reviewed and stable  Post vital signs: Reviewed and stable  Last Vitals:  Filed Vitals:   11/19/15 1130  BP: 139/85  Pulse: 63  Temp: 36.8 C  Resp: 18    Complications: No apparent anesthesia complications

## 2015-11-19 NOTE — Anesthesia Preprocedure Evaluation (Addendum)
Anesthesia Evaluation  Patient identified by MRN, date of birth, ID band Patient awake    Reviewed: Allergy & Precautions, NPO status , Patient's Chart, lab work & pertinent test results, reviewed documented beta blocker date and time   Airway Mallampati: II  TM Distance: >3 FB Neck ROM: Full    Dental  (+) Dental Advisory Given, Poor Dentition, Chipped, Missing   Pulmonary former smoker,    Pulmonary exam normal breath sounds clear to auscultation       Cardiovascular hypertension, Pt. on medications and Pt. on home beta blockers Normal cardiovascular exam Rhythm:Regular Rate:Normal  11/10/15 EKG: Sinus rhythm with 1st degree A-V block   Neuro/Psych PSYCHIATRIC DISORDERS Anxiety negative neurological ROS     GI/Hepatic Neg liver ROS, GERD  Medicated,Rectal cancer s/p resection, colostomy   Endo/Other  Obesity   Renal/GU negative Renal ROS     Musculoskeletal  (+) Arthritis ,   Abdominal   Peds  Hematology negative hematology ROS (+) Plt 316k on 3/2//17   Anesthesia Other Findings Day of surgery medications reviewed with the patient.  Reproductive/Obstetrics                         Anesthesia Physical Anesthesia Plan  ASA: III  Anesthesia Plan: Spinal   Post-op Pain Management:    Induction:   Airway Management Planned:   Additional Equipment:   Intra-op Plan:   Post-operative Plan:   Informed Consent: I have reviewed the patients History and Physical, chart, labs and discussed the procedure including the risks, benefits and alternatives for the proposed anesthesia with the patient or authorized representative who has indicated his/her understanding and acceptance.   Dental advisory given  Plan Discussed with: CRNA  Anesthesia Plan Comments: (Discussed risks and benefits of and differences between spinal and general. Discussed risks of spinal including headache, backache,  failure, bleeding, infection, and nerve damage. Patient consents to spinal. Questions answered. Coagulation studies and platelet count acceptable.  Backup GA if unable to position patient for spinal (history of traumatic left hip injury))        Anesthesia Quick Evaluation

## 2015-11-19 NOTE — Brief Op Note (Signed)
11/19/2015  3:11 PM  PATIENT:  [name]  60 y.o. male  PRE-OPERATIVE DIAGNOSIS:  severe post-traumatic arthritis left hip  POST-OPERATIVE DIAGNOSIS:  severe post-traumatic arthritis left hip  PROCEDURE:  Procedure(s): LEFT TOTAL HIP ARTHROPLASTY ANTERIOR APPROACH (Left)  SURGEON:  Surgeon(s) and Role:    * Mcarthur Rossetti, MD - Primary  PHYSICIAN ASSISTANT: Benita Stabile, PA-C  ANESTHESIA:   spinal  EBL:  Total I/O In: 1000 [I.V.:1000] Out: 1050 [Urine:250; Blood:800]  BLOOD ADMINISTERED:none  DRAINS: none   COUNTS:  YES  DICTATION: .Other Dictation: Dictation Number [id number]  PLAN OF CARE: Admit to inpatient   PATIENT DISPOSITION:  PACU - hemodynamically stable.   Delay start of Pharmacological VTE agent (>24hrs) due to surgical blood loss or risk of bleeding: no

## 2015-11-19 NOTE — Progress Notes (Signed)
PHARMACIST - PHYSICIAN ORDER COMMUNICATION  CONCERNING: P&T Medication Policy on Herbal Medications  DESCRIPTION:  This patient's order for:  Turmeric  has been noted.  This product(s) is classified as an "herbal" or natural product. Due to a lack of definitive safety studies or FDA approval, nonstandard manufacturing practices, plus the potential risk of unknown drug-drug interactions while on inpatient medications, the Pharmacy and Therapeutics Committee does not permit the use of "herbal" or natural products of this type within [facility].   ACTION TAKEN: The pharmacy department is unable to verify this order at this time and your patient has been informed of this safety policy. Please reevaluate patient's clinical condition at discharge and address if the herbal or natural product(s) should be resumed at that time.  Gretta Arab PharmD, BCPS Pager [phone] 11/19/2015 5:26 PM

## 2015-11-19 NOTE — H&P (Signed)
TOTAL HIP ADMISSION H&P  Patient is admitted for left total hip arthroplasty.  Subjective:  Chief Complaint: left hip pain  HPI: [name], 60 y.o. male, has a history of pain and functional disability in the left hip(s) due to trauma and arthritis and patient has failed non-surgical conservative treatments for greater than 12 weeks to include NSAID's and/or analgesics, use of assistive devices and activity modification.  Onset of symptoms was gradual starting >10 years ago with gradually worsening course since that time.The patient noted no past surgery on the left hip(s).  Patient currently rates pain in the left hip at 10 out of 10 with activity. Patient has night pain, worsening of pain with activity and weight bearing, trendelenberg gait, pain that interfers with activities of daily living, pain with passive range of motion and crepitus. Patient has evidence of subchondral sclerosis, periarticular osteophytes and joint space narrowing by imaging studies. This condition presents safety issues increasing the risk of falls.  There is no current active infection.  Patient Active Problem List   Diagnosis Date Noted  . Osteoarthritis of left hip 11/19/2015  . Avascular necrosis of bone of left hip ([place]) 10/06/2015  . Acquired inequality of length of lower legs 10/06/2015  . Colostomy in place [facility])   . Essential hypertension, benign 05/12/2014  . Tachycardia 04/14/2014  . Thrombocytosis ([place]) 04/14/2014  . Eustachian tube dysfunction 04/07/2014  . Obesity (BMI 30-39.9) 03/24/2014  . Rectal cancer, uT2uN0, s/p LAR/ileostomy 03/20/2014 colostomy [id number] 12/25/2013  . Hemorrhoids, internal 12/25/2013  . GERD (gastroesophageal reflux disease) 11/27/2013  . Atypical chest pain 11/10/2013  . Tobacco abuse, in remission 11/10/2013   Past Medical History  Diagnosis Date  . GERD (gastroesophageal reflux disease)   . Arthritis   . Aspiration pneumonia ([place]) 03/24/2014  . Hypertension   .  Difficulty urinating   . Anxiety   . Colostomy in place [facility])   . Ileostomy present ([place])   . Shortening, leg, congenital     LEFT  . Rectal cancer, uT2uN0, s/p LAR/ileostomy 03/20/2014 colostomy [id number] 12/25/2013    TCS MAY 2015   . Skin irritation     "around ostomy site" -dry and reddened.    Past Surgical History  Procedure Laterality Date  . Colonoscopy N/A 12/22/2013    SLF: 1. Normal mucosa in the terminal ilium. 2. Moderate diverticulosis in the transverse colon, descending colon, and sigmoid colon. 3. Rectal bleeding due ot rectal mass and hemorrhoids 4. Moderate sized internal hemorrhoids.   . Esophagogastroduodenoscopy N/A 12/22/2013    SLF: Probable Candia esophagitis2. Probable Barretts esphagus 3. Mild gastritis.   . Eus N/A 01/01/2014    Procedure: LOWER ENDOSCOPIC ULTRASOUND (EUS);  Surgeon: Milus Banister, MD;  Location: [facility];  Service: Endoscopy;  Laterality: N/A;  . Hip fracture surgery Left age 89 on 04-06-1970  . Eye reattachment and ear reattachment  Right 1971  . Colon resection N/A 03/24/2014    Procedure: Diagnostic  laparotomy, takedown of coloanal anastamosis, end colostomy, with wound vac;  Surgeon: Adin Hector, MD;  Location: [facility];  Service: General;  Laterality: N/A;  . Flexible sigmoidoscopy N/A 03/24/2014    Procedure: FLEXIBLE SIGMOIDOSCOPY;  Surgeon: Adin Hector, MD;  Location: [facility];  Service: General;  Laterality: N/A;  . Ileostomy    . Colostomy    . Ileostomy closure N/A 08/25/2014    Procedure: TAKEDOWN OF LOOP ILEOSTOMY ;  Surgeon: Michael Boston, MD;  Location: [facility];  Service: General;  Laterality: N/A;  . Examination under anesthesia  08/25/2014    Procedure: EXAM UNDER ANESTHESIA;  Surgeon: Michael Boston, MD;  Location: [facility];  Service: General;;  . Colonoscopy N/A 01/19/2015    Procedure: COLONOSCOPY;  Surgeon: Danie Binder, MD;  Location[facility];  Service: Endoscopy;  Laterality: N/A;  1215    No prescriptions prior to  admission   No Known Allergies  Social History  Substance Use Topics  . Smoking status: Former Smoker -- 0.00 packs/day for 15 years    Types: Cigars    Quit date: 09/10/2011  . Smokeless tobacco: Never Used  . Alcohol Use: No     Comment: quit years ago    Family History  Problem Relation Age of Onset  . Colon polyps Neg Hx   . Colon cancer Sister      Review of Systems  Musculoskeletal: Positive for joint pain.  All other systems reviewed and are negative.   Objective:  Physical Exam  Constitutional: He is oriented to person, place, and time. He appears well-developed and well-nourished.  HENT:  Head: Normocephalic and atraumatic.  Eyes: EOM are normal. Pupils are equal, round, and reactive to light.  Neck: Normal range of motion. Neck supple.  Cardiovascular: Normal rate and regular rhythm.   Respiratory: Effort normal and breath sounds normal.  GI: Soft. Bowel sounds are normal.  Musculoskeletal:       Left hip: He exhibits decreased range of motion, decreased strength, tenderness, bony tenderness, crepitus and deformity.  Neurological: He is alert and oriented to person, place, and time.  Skin: Skin is dry.  Psychiatric: He has a normal mood and affect.    Vital signs in last 24 hours:    Labs:   Estimated body mass index is 32.25 kg/(m^2) as calculated from the following:   Height as of 10/06/15: 5\' 4"  (1.626 m).   Weight as of 10/06/15: 85.276 kg (188 lb).   Imaging Review Plain radiographs demonstrate severe degenerative joint disease of the left hip(s). The bone quality appears to be good for age and reported activity level.  Assessment/Plan:  End stage arthritis, left hip(s)  The patient history, physical examination, clinical judgement of the provider and imaging studies are consistent with end stage degenerative joint disease of the left hip(s) and total hip arthroplasty is deemed medically necessary. The treatment options including medical  management, injection therapy, arthroscopy and arthroplasty were discussed at length. The risks and benefits of total hip arthroplasty were presented and reviewed. The risks due to aseptic loosening, infection, stiffness, dislocation/subluxation,  thromboembolic complications and other imponderables were discussed.  The patient acknowledged the explanation, agreed to proceed with the plan and consent was signed. Patient is being admitted for inpatient treatment for surgery, pain control, PT, OT, prophylactic antibiotics, VTE prophylaxis, progressive ambulation and ADL's and discharge planning.The patient is planning to be discharged home with home health services

## 2015-11-20 LAB — BASIC METABOLIC PANEL
Anion gap: 6 (ref 5–15)
BUN: 18 mg/dL (ref 6–20)
CHLORIDE: 106 mmol/L (ref 101–111)
CO2: 26 mmol/L (ref 22–32)
CREATININE: 0.83 mg/dL (ref 0.61–1.24)
Calcium: 8.1 mg/dL — ABNORMAL LOW (ref 8.9–10.3)
GFR calc non Af Amer: >60 mL/min (ref >60)
Glucose, Bld: 122 mg/dL — ABNORMAL HIGH (ref 65–99)
POTASSIUM: 4.2 mmol/L (ref 3.5–5.1)
SODIUM: 138 mmol/L (ref 135–145)

## 2015-11-20 LAB — CBC
HEMATOCRIT: 32.1 % — AB (ref 39.0–52.0)
HEMOGLOBIN: 10.7 g/dL — AB (ref 13.0–17.0)
MCH: 31.8 pg (ref 26.0–34.0)
MCHC: 33.3 g/dL (ref 30.0–36.0)
MCV: 95.3 fL (ref 78.0–100.0)
Platelets: 234 10*3/uL (ref 150–400)
RBC: 3.37 MIL/uL — AB (ref 4.22–5.81)
RDW: 14.1 % (ref 11.5–15.5)
WBC: 8.6 10*3/uL (ref 4.0–10.5)

## 2015-11-20 MED ORDER — METHOCARBAMOL 500 MG PO TABS: 500.0000 mg | ORAL_TABLET | Freq: Four times a day (QID) | ORAL | Status: DC | PRN

## 2015-11-20 MED ORDER — OXYCODONE-ACETAMINOPHEN 5-325 MG PO TABS: 1.0000 | ORAL_TABLET | ORAL | Status: DC | PRN

## 2015-11-20 MED ORDER — ASPIRIN 325 MG PO TBEC: 325.0000 mg | DELAYED_RELEASE_TABLET | Freq: Two times a day (BID) | ORAL | Status: DC

## 2015-11-20 NOTE — Progress Notes (Signed)
OT Cancellation Note  Patient Details Name: [name] MRN: [id number] DOB: [birth year]   Cancelled Treatment:    Reason Eval/Treat Not Completed: Other (comment) Pt back to bed with PT - will check on pt in the morning  Lamaj Metoyer, Thereasa Parkin 11/20/2015, 3:35 PM

## 2015-11-20 NOTE — Progress Notes (Signed)
Subjective: 1 Day Post-Op Procedure(s) (LRB): LEFT TOTAL HIP ARTHROPLASTY ANTERIOR APPROACH (Left) Patient reports pain as moderate.  Some nausea this am.  Acute blood loss anemia, but tolerating well.  Objective: Vital signs in last 24 hours: Temp:  [97.6 F (36.4 C)-98.7 F (37.1 C)] 98.4 F (36.9 C) (04/01 0900) Pulse Rate:  [54-82] 70 (04/01 0900) Resp:  [15-18] 15 (04/01 0900) BP: (99-139)/(60-85) 101/60 mmHg (04/01 0900) SpO2:  [95 %-100 %] 95 % (04/01 0900) Weight:  [85.73 kg (189 lb)] 85.73 kg (189 lb) (03/31 1139)  Intake/Output from previous day: 03/31 0701 - 04/01 0700 In: 4355.3 [P.O.:840; I.V.:3515.3] Out: 2500 [Urine:1700; Blood:800] Intake/Output this shift: Total I/O In: 240 [P.O.:240] Out: 175 [Urine:175]   Recent Labs  11/20/15 0445  HGB 10.7*    Recent Labs  11/20/15 0445  WBC 8.6  RBC 3.37*  HCT 32.1*  PLT 234    Recent Labs  11/20/15 0445  NA 138  K 4.2  CL 106  CO2 26  BUN 18  CREATININE 0.83  GLUCOSE 122*  CALCIUM 8.1*   No results for input(s): LABPT, INR in the last 72 hours.  Sensation intact distally Intact pulses distally Dorsiflexion/Plantar flexion intact Incision: dressing C/D/I  Assessment/Plan: 1 Day Post-Op Procedure(s) (LRB): LEFT TOTAL HIP ARTHROPLASTY ANTERIOR APPROACH (Left) Up with therapy Discharge home with home health tomorrow vs Monday pending progress with therapy.  Hanzel Pizzo Y 11/20/2015, 9:28 AM

## 2015-11-20 NOTE — Op Note (Signed)
NAMECHADWYCK, SCHUCHMANN NO.:  [id number]  MEDICAL RECORD NO.:  [id number]  LOCATION:  [id number]                         FACILITY:  [facility]  PHYSICIAN:  Lind Guest. Ninfa Linden, M.D.DATE OF BIRTH:  [birth year]  DATE OF PROCEDURE:  11/19/2015 DATE OF DISCHARGE:                              OPERATIVE REPORT   PREOPERATIVE DIAGNOSIS:  Severe posttraumatic arthritis on top of osteoarthritis with significant deformity of the left hip.  POSTOPERATIVE DIAGNOSIS:  Severe posttraumatic arthritis on top of osteoarthritis with significant deformity of the left hip.  PROCEDURE:  Left total hip arthroplasty through direct anterior approach.  IMPLANTS:  DePuy Sector Gription acetabular component size 54 with a single screw, size 36+ 0 polyethylene liner, size 11 Corail femoral component with standard offset, size 36+ 1 ceramic hip ball.  SURGEON:  Lind Guest. Ninfa Linden, M.D.  ASSISTANT:  Erskine Emery, PA-C.  ANESTHESIA:  Spinal.  ANTIBIOTICS:  2 g of IV Ancef.  BLOOD LOSS:  800-900 mL.  COMPLICATIONS:  None.  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  Over the years, he wanted to compensate for this, he had never had surgery on his hip and he may not have even been in some type of traction.  However, he eventually healed his fracture, but developed debilitating posttraumatic arthritis of his left hip.  He walks with significant Trendelenburg gait.  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  At this point, though his pain is daily, is greatly limited his activities of daily living, his mobility, his quality of life and is affecting his job.  His leg is significantly short on the left than the right.  At this point, he does wish to proceed with total hip arthroplasty  surgery.  He understands the significant risks due to his deformity of acute blood loss anemia, nerve and vessel injury, fracture, infection, and DVT.  He understands our goals are decreased pain, improved mobility, and overall improved quality of life.  PROCEDURE DESCRIPTION:  After informed consent was obtained, appropriate left hip was marked.  He was brought to the operating room and spinal anesthesia was obtained while he was on the stretcher.  Next, he was placed supine on the Hana fracture table with the perineal post in place and both legs in inline skeletal traction devices, but no traction applied.  His left operative hip was then prepped and draped with DuraPrep and sterile drapes.  Time-out was called, he was identified as correct patient and correct left hip.  We then made an incision inferior and posterior to the anterior superior iliac spine and carried this obliquely down the leg.  We dissected down the tensor fascia lata muscle and tensor fascia was then divided longitudinally, so we could proceed with a direct anterior approach to the hip.  We identified and cauterized the lateral femoral circumflex vessels and then identified the hip capsule.  I opened up the hip capsule in an L-type format.  The patient had to perform a capsulectomy  due to the severity of his deformity.  We found a significant deformed femoral head and neck and made our femoral neck cut with an oscillating saw.  I removed the femoral head in its entirety and found it to be significantly devoid of cartilage, deformed, flat and gray.  We passed this off the back table. We then cleaned the acetabular and remnants of other debris and remnants of the acetabular labrum and then began reaming under direct visualization from a size 42 reamer up to a size 54, with all reamers under direct visualization and last reamer under direct fluoroscopy, so we could obtain our depth of reaming, our inclination and  anteversion. Once we were pleased with this, we placed the real DePuy Sector Gription acetabular component size 54 and a single screw, and a 36+ 0 polyethylene liner for that size acetabular component.  Attention was then turned to the femur.  With the leg externally rotated to 125 degrees extended and adducted, we were able to use a Mueller retractor medially and a Hohmann retractor behind the remnants of the greater trochanter.  We released any kind of capsular tissue that we could and used a box cutting osteotome to enter the femoral canal and a rongeur to lateralize.  We then began broaching from a size 8 broach up to a size 11.  With the size 11 in place, we trialed a standard offset femoral neck and 36+ 1.5 hip ball and brought the leg back over and up and reducing the pelvis and we were pleased that it was very tight and we accomplished this close to the leg length and stability with range of motion.  We then dislocated the hip and removed the trial components. We placed the real Corail femoral component size 11 and the real 36+ 1.5 hip ball.  We reduced this in the pelvis and again it felt to be stable. We put his hip through significant motion due to the deformity, I was concerned about any type of fracture.  We did not see C-arm guidance and then I feel any fracture planes and the hip was stable.  We irrigated the soft tissue with normal saline solution and then closed the tensor fascia with interrupted #1 Vicryl suture followed by 0 Vicryl in the deep tissue, 2-0 Vicryl in the subcutaneous tissue, staples on the skin. Xeroform and well-padded Aquacel dressing were applied.  He was then taken off the Hana table and taken to the recovery room in stable condition.  All final counts were correct.  There were no complications noted.  Of note, Erskine Emery, PA-C assisted the entire case, his assistance was crucial for facilitating all aspects of this case.     Lind Guest.  Ninfa Linden, M.D.     CYB/MEDQ  D:  11/19/2015  T:  11/20/2015  Job:  [id number]

## 2015-11-20 NOTE — Progress Notes (Signed)
CSW had received referral for New SNF.  Per chart, PT rec HH PT and MD note states plan for home when discharged.  Inappropriate CSW referral.  CSW signing off.   Alison Murray, MSW, LCSW Clinical Social Work Weekend coverage [phone]

## 2015-11-20 NOTE — Progress Notes (Signed)
Physical Therapy Treatment Patient Details Name: [name] MRN: [id number] DOB: 12/23/1955 Today's Date: 11/20/2015    History of Present Illness L THR; pt with hx of rectal CA with colostomy in place    PT Comments    Pt very motivated and progressing steadily but with increased time needed most tasks.  Follow Up Recommendations  Home health PT     Equipment Recommendations  None recommended by PT    Recommendations for Other Services OT consult     Precautions / Restrictions Precautions Precautions: Fall Restrictions Weight Bearing Restrictions: No Other Position/Activity Restrictions: Wbat    Mobility  Bed Mobility Overal bed mobility: Needs Assistance Bed Mobility: Sit to Supine     Supine to sit: Min assist Sit to supine: Min assist   General bed mobility comments: cues for sequence and use of R LE to self assist  Transfers Overall transfer level: Needs assistance Equipment used: Rolling walker (2 wheeled) Transfers: Sit to/from Stand Sit to Stand: Min assist         General transfer comment: cues for LE management and use of UEs to self assist  Ambulation/Gait Ambulation/Gait assistance: Min assist Ambulation Distance (Feet): 68 Feet Assistive device: Rolling walker (2 wheeled) Gait Pattern/deviations: Step-to pattern;Decreased step length - right;Decreased step length - left;Shuffle;Trunk flexed Gait velocity: decr Gait velocity interpretation: Below normal speed for age/gender General Gait Details: cues for sequence, posture, stride length, and position from RW   Stairs            Wheelchair Mobility    Modified Rankin (Stroke Patients Only)       Balance                                    Cognition Arousal/Alertness: Awake/alert Behavior During Therapy: WFL for tasks assessed/performed Overall Cognitive Status: Within Functional Limits for tasks assessed                      Exercises Total  Joint Exercises Ankle Circles/Pumps: AROM;Both;15 reps;Supine Quad Sets: AROM;Both;10 reps;Supine Heel Slides: AAROM;20 reps;Supine;Left Hip ABduction/ADduction: AAROM;Left;15 reps;Supine    General Comments        Pertinent Vitals/Pain Pain Assessment: 0-10 Pain Score: 3  Pain Location: L hip/thigh Pain Descriptors / Indicators: Aching;Sore Pain Intervention(s): Limited activity within patient's tolerance;Monitored during session;Premedicated before session;Ice applied    Home Living Family/patient expects to be discharged to:: Private residence Living Arrangements: Spouse/significant other Available Help at Discharge: Family Type of Home: Mobile home Home Access: Stairs to enter Entrance Stairs-Rails: Right;Left;Can reach both Home Layout: One level Home Equipment: Environmental consultant - 2 wheels;Cane - single point      Prior Function Level of Independence: Independent          PT Goals (current goals can now be found in the care plan section) Acute Rehab PT Goals Patient Stated Goal: Regain IND and walk without pain PT Goal Formulation: With patient Time For Goal Achievement: 11/23/15 Potential to Achieve Goals: Good Progress towards PT goals: Progressing toward goals    Frequency  7X/week    PT Plan Current plan remains appropriate    Co-evaluation             End of Session Equipment Utilized During Treatment: Gait belt Activity Tolerance: Patient tolerated treatment well Patient left: in bed;with call bell/phone within reach;with family/visitor present     Time: [id number] PT Time  Calculation (min) (ACUTE ONLY): 32 min  Charges:  $Gait Training: 23-37 mins $Therapeutic Exercise: 8-22 mins                    G Codes:      Tanisia [name] 12-16-2015, 3:20 PM

## 2015-11-20 NOTE — Evaluation (Signed)
Physical Therapy Evaluation Patient Details Name: [name] MRN: [id number] DOB: 12-29-1955 Today's Date: 11/20/2015   History of Present Illness  L THR; pt with hx of rectal CA with colostomy in place  Clinical Impression  Pt s/p L THR presents with decreased L LE strength/ROM and post op pain limiting functional mobility.  Pt should progress to dc home with family assist and HHPT follow up.    Follow Up Recommendations Home health PT    Equipment Recommendations  None recommended by PT    Recommendations for Other Services OT consult     Precautions / Restrictions Precautions Precautions: Fall Restrictions Weight Bearing Restrictions: No Other Position/Activity Restrictions: Wbat      Mobility  Bed Mobility Overal bed mobility: Needs Assistance Bed Mobility: Supine to Sit     Supine to sit: Min assist     General bed mobility comments: cues for sequence and use of R LE to self assist  Transfers Overall transfer level: Needs assistance Equipment used: Rolling walker (2 wheeled) Transfers: Sit to/from Stand Sit to Stand: Min assist         General transfer comment: cues for LE management and use of UEs to self assist  Ambulation/Gait Ambulation/Gait assistance: Min assist;Mod assist Ambulation Distance (Feet): 34 Feet Assistive device: Rolling walker (2 wheeled) Gait Pattern/deviations: Step-to pattern;Decreased step length - right;Decreased step length - left;Shuffle;Trunk flexed Gait velocity: decr Gait velocity interpretation: Below normal speed for age/gender General Gait Details: cues for sequence, posture and position from [employer]            Wheelchair Mobility    Modified Rankin (Stroke Patients Only)       Balance                                             Pertinent Vitals/Pain Pain Assessment: 0-10 Pain Score: 3  Pain Location: L hip/thigh Pain Descriptors / Indicators: Aching;Sore Pain  Intervention(s): Limited activity within patient's tolerance;Monitored during session;Premedicated before session;Ice applied    Home Living Family/patient expects to be discharged to:: Private residence Living Arrangements: Spouse/significant other Available Help at Discharge: Family Type of Home: Mobile home Home Access: Stairs to enter Entrance Stairs-Rails: Right;Left;Can reach both Entrance Stairs-Number of Steps: 3 Home Layout: One level Home Equipment: [place] - 2 wheels;Cane - single point      Prior Function Level of Independence: Independent               Hand Dominance   Dominant Hand: Right    Extremity/Trunk Assessment   Upper Extremity Assessment: Overall WFL for tasks assessed           Lower Extremity Assessment: LLE deficits/detail   LLE Deficits / Details: Strength at hip 2/5 with AAROM at hip to 75 flex and 15 abd  Cervical / Trunk Assessment: Normal  Communication   Communication: No difficulties  Cognition Arousal/Alertness: Awake/alert Behavior During Therapy: WFL for tasks assessed/performed Overall Cognitive Status: Within Functional Limits for tasks assessed                      General Comments      Exercises Total Joint Exercises Ankle Circles/Pumps: AROM;Both;15 reps;Supine Quad Sets: AROM;Both;10 reps;Supine Heel Slides: AAROM;20 reps;Supine;Left Hip ABduction/ADduction: AAROM;Left;15 reps;Supine      Assessment/Plan    PT Assessment Patient needs continued PT services  PT Diagnosis Difficulty walking   PT Problem List Decreased strength;Decreased range of motion;Decreased activity tolerance;Decreased mobility;Decreased knowledge of use of DME;Pain  PT Treatment Interventions DME instruction;Gait training;Stair training;Functional mobility training;Therapeutic activities;Therapeutic exercise;Patient/family education   PT Goals (Current goals can be found in the Care Plan section) Acute Rehab PT Goals Patient  Stated Goal: Regain IND and walk without pain PT Goal Formulation: With patient Time For Goal Achievement: 11/23/15 Potential to Achieve Goals: Good    Frequency 7X/week   Barriers to discharge        Co-evaluation               End of Session Equipment Utilized During Treatment: Gait belt Activity Tolerance: Patient tolerated treatment well Patient left: in chair;with call bell/phone within reach;with chair alarm set Nurse Communication: Mobility status         Time: [id number] PT Time Calculation (min) (ACUTE ONLY): 30 min   Charges:   PT Evaluation $PT Eval Low Complexity: 1 Procedure PT Treatments $Therapeutic Exercise: 8-22 mins   PT G Codes:        Tereka Thorley 2015/12/08, 3:14 PM

## 2015-11-20 NOTE — Discharge Instructions (Signed)

## 2015-11-21 NOTE — Evaluation (Signed)
Occupational Therapy Evaluation Patient Details Name: [name] MRN: [id number] DOB: 1956-06-27 Today's Date: 11/21/2015    History of Present Illness L THR; pt with hx of rectal CA with colostomy in place   Clinical Impression   Pt is s/p THA resulting in the deficits listed below (see OT Problem List).  Pt will benefit from skilled OT to increase their safety and independence with ADL and functional mobility for ADL to facilitate discharge to venue listed below.        Follow Up Recommendations  No OT follow up    Equipment Recommendations  None recommended by OT       Precautions / Restrictions Precautions Precautions: Fall Restrictions Weight Bearing Restrictions: No LLE Weight Bearing: Weight bearing as tolerated      Mobility Bed Mobility Overal bed mobility: Needs Assistance Bed Mobility: Supine to Sit     Supine to sit: Min assist     General bed mobility comments: pt in chair  Transfers Overall transfer level: Needs assistance Equipment used: Rolling walker (2 wheeled) Transfers: Sit to/from [facility] Sit to Stand: Min guard Stand pivot transfers: Min guard       General transfer comment: close guard for safety.          ADL Overall ADL's : Needs assistance/impaired Eating/Feeding: Set up;Sitting   Grooming: Standing;Supervision/safety           Upper Body Dressing : Minimal assistance;Sitting   Lower Body Dressing: Minimal assistance;Sit to/from stand   Toilet Transfer: Minimal assistance;RW   Toileting- Clothing Manipulation and Hygiene: Minimal assistance;Sit to/from stand         General ADL Comments: pt uses nail bag to support ostomy bag- cool idea he came up with!               Pertinent Vitals/Pain Pain Assessment: 0-10 Pain Score: 4  Pain Location:  L hip/thigh area Pain Descriptors / Indicators: Sore Pain Intervention(s): Monitored during session     Hand Dominance Right    Extremity/Trunk Assessment Upper Extremity Assessment Upper Extremity Assessment: Overall WFL for tasks assessed           Communication Communication Communication: No difficulties   Cognition Arousal/Alertness: Awake/alert Behavior During Therapy: WFL for tasks assessed/performed Overall Cognitive Status: Within Functional Limits for tasks assessed                                Home Living Family/patient expects to be discharged to:: Private residence Living Arrangements: Spouse/significant other Available Help at Discharge: Family Type of Home: Mobile home Home Access: Stairs to enter Entrance Stairs-Number of Steps: 3 Entrance Stairs-Rails: Right;Left;Can reach both Home Layout: One level     Bathroom Shower/Tub: Teacher, early years/pre: Standard     Home Equipment: Environmental consultant - 2 wheels;Cane - single point          Prior Functioning/Environment Level of Independence: Independent             OT Diagnosis: Generalized weakness;Acute pain   OT Problem List: Decreased strength   OT Treatment/Interventions: DME and/or AE instruction;Patient/family education;Self-care/ADL training    OT Goals(Current goals can be found in the care plan section) Acute Rehab OT Goals Patient Stated Goal: Regain IND and walk without pain OT Goal Formulation: With patient Time For Goal Achievement: 12/05/15 Potential to Achieve Goals: Good  OT Frequency: Min 2X/week   Barriers to D/C:  Co-evaluation              End of Session Nurse Communication: Mobility status  Activity Tolerance: Patient tolerated treatment well Patient left: in chair;with call bell/phone within reach   Time: 1040-1104 OT Time Calculation (min): 24 min Charges:  OT General Charges $OT Visit: 1 Procedure OT Evaluation $OT Eval Moderate Complexity: 1 Procedure OT Treatments $Self Care/Home Management : 8-22 mins G-Codes:    Payton Mccallum D Dec 21, 2015,  11:09 AM

## 2015-11-21 NOTE — Progress Notes (Signed)
Physical Therapy Treatment Patient Details Name: [name] MRN: [id number] DOB: 12-18-1955 Today's Date: 11/21/2015    History of Present Illness L THR; pt with hx of rectal CA with colostomy in place    PT Comments    Progressing with mobility. Increased soreness, stiffness this afternoon. Wife states she will let pt know if they will have ramp put up by tomorrow.   Follow Up Recommendations  Home health PT     Equipment Recommendations  None recommended by PT    Recommendations for Other Services       Precautions / Restrictions Precautions Precautions: Fall Restrictions Weight Bearing Restrictions: No LLE Weight Bearing: Weight bearing as tolerated    Mobility  Bed Mobility Overal bed mobility: Needs Assistance Bed Mobility: Sit to Supine       Sit to supine: Min assist   General bed mobility comments: Assist for L LE  Transfers Overall transfer level: Needs assistance Equipment used: Rolling walker (2 wheeled) Transfers: Sit to/from Stand Sit to Stand: Min guard         General transfer comment: close guard for safety.   Ambulation/Gait Ambulation/Gait assistance: Min guard Ambulation Distance (Feet): 125 Feet Assistive device: Rolling walker (2 wheeled) Gait Pattern/deviations: Step-to pattern;Antalgic     General Gait Details: VCs for sequence, posture, L foot positioning, step length. slow gait speed. close guard for safety. Pt reports increased stiffness this afternoon.    Stairs            Wheelchair Mobility    Modified Rankin (Stroke Patients Only)       Balance                                    Cognition Arousal/Alertness: Awake/alert Behavior During Therapy: WFL for tasks assessed/performed Overall Cognitive Status: Within Functional Limits for tasks assessed                      Exercises Total Joint Exercises Heel Slides: AAROM;10 reps;Supine (wife practiced with pt) Hip  ABduction/ADduction: AAROM;10 reps;Supine (wife practiced with pt)    General Comments        Pertinent Vitals/Pain Pain Assessment: 0-10 Pain Score: 5  Pain Location: L anterior thigh Pain Descriptors / Indicators: Sore;Discomfort;Aching Pain Intervention(s): Monitored during session;Ice applied;Repositioned;Premedicated before session    Home Living                      Prior Function            PT Goals (current goals can now be found in the care plan section) Progress towards PT goals: Progressing toward goals    Frequency  7X/week    PT Plan Current plan remains appropriate    Co-evaluation             End of Session   Activity Tolerance: Patient tolerated treatment well Patient left: with call bell/phone within reach;with bed alarm set;with family/visitor present     Time: [id number] PT Time Calculation (min) (ACUTE ONLY): 35 min  Charges:  $Gait Training: 23-37 mins                    G Codes:      Weston Anna, MPT Pager: [phone]

## 2015-11-21 NOTE — Care Management Note (Signed)
Case Management Note  Patient Details  Name: [name] MRN: [id number] Date of Birth: Sep 18, 1955  Subjective/Objective:   L THR                Action/Plan: Discharge Planning:  NCM spoke to pt and wife at bedside. Offered choice for HH/provided [facility] list. Pt agreeable to [facility] for [facility]. Contacted [facility] liaison for referral. Pt has RW and ample supply of his ostomy supplies at home. Wife at home to assist with his care.   Expected Discharge Date:  11/22/2015             Expected Discharge Plan:  [place]  In-House Referral:  NA  Discharge planning Services  CM Consult  Post Acute Care Choice:  Home Health Choice offered to:  Patient  DME Arranged:  N/A DME Agency:  NA  HH Arranged:  PT HH Agency:  [facility]  Status of Service:  Completed, signed off  Medicare Important Message Given:    Date Medicare IM Given:    Medicare IM give by:    Date Additional Medicare IM Given:    Additional Medicare Important Message give by:     If discussed at [place] of Stay Meetings, dates discussed:    Additional Comments:  Erenest Rasher, RN 11/21/2015, 1:31 PM

## 2015-11-21 NOTE — Progress Notes (Signed)
Patient ID: [name], male   DOB: December 17, 1955, 60 y.o.   MRN: [id number] Patient is making steady progress with therapy. Anticipate he should be safe for discharge on Monday.

## 2015-11-21 NOTE — Progress Notes (Signed)
Physical Therapy Treatment Patient Details Name: POLK PHONG MRN: [id number] DOB: 1956/06/01 Today's Date: 11/21/2015    History of Present Illness L THR; pt with hx of rectal CA with colostomy in place    PT Comments    Progressing with mobility. Requiring Min assist for mobility. Pain rated ~3/10 with activity.   Follow Up Recommendations  Home health PT     Equipment Recommendations  None recommended by PT    Recommendations for Other Services OT consult     Precautions / Restrictions Precautions Precautions: Fall Restrictions Weight Bearing Restrictions: No LLE Weight Bearing: Weight bearing as tolerated    Mobility  Bed Mobility Overal bed mobility: Needs Assistance Bed Mobility: Supine to Sit     Supine to sit: Min assist     General bed mobility comments: Assist for trunk and L LE. Increased time. cues for technique.  Transfers Overall transfer level: Needs assistance Equipment used: Rolling walker (2 wheeled) Transfers: Sit to/from Stand Sit to Stand: Min guard         General transfer comment: close guard for safety.   Ambulation/Gait Ambulation/Gait assistance: Min guard Ambulation Distance (Feet): 115 Feet Assistive device: Rolling walker (2 wheeled) Gait Pattern/deviations: Step-to pattern     General Gait Details: VCs for sequence, posture, L foot positioning, step length. slow gait speed. close guard for safety   Stairs            Wheelchair Mobility    Modified Rankin (Stroke Patients Only)       Balance                                    Cognition Arousal/Alertness: Awake/alert Behavior During Therapy: WFL for tasks assessed/performed Overall Cognitive Status: Within Functional Limits for tasks assessed                      Exercises Total Joint Exercises Ankle Circles/Pumps: AROM;Both;15 reps;Supine Quad Sets: AROM;Both;10 reps;Supine Heel Slides: AAROM;Left;10 reps;Supine Hip  ABduction/ADduction: AAROM;Left;10 reps;Supine    General Comments        Pertinent Vitals/Pain Pain Assessment: 0-10 Pain Score: 3  Pain Location: L thigh area Pain Descriptors / Indicators: Sore;Discomfort;Tightness Pain Intervention(s): Monitored during session;Ice applied;Repositioned    Home Living                      Prior Function            PT Goals (current goals can now be found in the care plan section) Progress towards PT goals: Progressing toward goals    Frequency  7X/week    PT Plan Current plan remains appropriate    Co-evaluation             End of Session   Activity Tolerance: Patient tolerated treatment well Patient left: in chair;with call bell/phone within reach;with chair alarm set     Time: [id number] PT Time Calculation (min) (ACUTE ONLY): 33 min  Charges:  $Gait Training: 8-22 mins $Therapeutic Exercise: 8-22 mins                    G Codes:      Weston Anna, MPT Pager: [phone]

## 2015-11-22 ENCOUNTER — Encounter (HOSPITAL_COMMUNITY): Payer: Self-pay | Admitting: Orthopaedic Surgery

## 2015-11-22 NOTE — Progress Notes (Signed)
Physical Therapy Treatment Patient Details Name: [name] MRN: [id number] DOB: 06-May-1956 Today's Date: 11/22/2015    History of Present Illness L THR; pt with hx of rectal CA with colostomy in place    PT Comments    Progressing with mobility. Some issues with stiffness. Practiced gait training and stair negotiation. All education completed.   Follow Up Recommendations  Home health PT     Equipment Recommendations  None recommended by PT    Recommendations for Other Services       Precautions / Restrictions Precautions Precautions: Fall Restrictions Weight Bearing Restrictions: No LLE Weight Bearing: Weight bearing as tolerated    Mobility  Bed Mobility               General bed mobility comments: pt in chair  Transfers Overall transfer level: Needs assistance Equipment used: Rolling walker (2 wheeled) Transfers: Sit to/from Stand Sit to Stand: Min guard Stand pivot transfers: Min guard       General transfer comment: close guard for safety. Increased time  Ambulation/Gait Ambulation/Gait assistance: Min guard Ambulation Distance (Feet): 300 Feet Assistive device: Rolling walker (2 wheeled) Gait Pattern/deviations: Step-to pattern;Decreased stride length     General Gait Details: close guard for safety. Difficulty with stiffness L hip. Pt requested to walk farther this session.   Stairs  Min guard assist; 2 steps (up and over portable steps). VCs safety, sequence, technique.           Wheelchair Mobility    Modified Rankin (Stroke Patients Only)       Balance                                    Cognition Arousal/Alertness: Awake/alert Behavior During Therapy: WFL for tasks assessed/performed Overall Cognitive Status: Within Functional Limits for tasks assessed                      Exercises  Standing knee flexion, 10 reps, AROM Heel raises, 10 reps, AROM    General Comments        Pertinent  Vitals/Pain Pain Assessment: 0-10 Pain Score: 5  Pain Location: L thigh Pain Descriptors / Indicators: Sore;Tightness;Discomfort Pain Intervention(s): Monitored during session;Repositioned    Home Living                      Prior Function            PT Goals (current goals can now be found in the care plan section) Progress towards PT goals: Progressing toward goals    Frequency  7X/week    PT Plan Current plan remains appropriate    Co-evaluation             End of Session   Activity Tolerance: Patient tolerated treatment well Patient left: in chair;with call bell/phone within reach     Time: [id number] PT Time Calculation (min) (ACUTE ONLY): 30 min  Charges:  $Gait Training: 23-37 mins                    G Codes:      Weston Anna, MPT Pager: [phone]

## 2015-11-22 NOTE — Progress Notes (Signed)
Pt to d/c home with [facility] home health. No DME needs. AVS reviewed and "My Chart" discussed with pt. Pt capable of verbalizing medications, signs and symptoms of infection, and follow-up appointments. Remains hemodynamically stable. No signs and symptoms of distress. Educated pt to return to ER in the case of SOB, dizziness, or chest pain.  

## 2015-11-22 NOTE — Progress Notes (Signed)
Occupational Therapy Treatment Patient Details Name: [name] MRN: [id number] DOB: February 18, 1956 Today's Date: 11/22/2015    History of present illness L THR; pt with hx of rectal CA with colostomy in place               Precautions / Restrictions Precautions Precautions: Fall Restrictions Weight Bearing Restrictions: No LLE Weight Bearing: Weight bearing as tolerated       Mobility Bed Mobility               General bed mobility comments: pt in chair  Transfers Overall transfer level: Needs assistance Equipment used: Rolling walker (2 wheeled) Transfers: Sit to/from Stand Sit to Stand: Supervision Stand pivot transfers: Supervision       General transfer comment: Vc for hand placement        ADL       Grooming: Standing;Supervision/safety           Upper Body Dressing : Set up;Sitting   Lower Body Dressing: Minimal assistance;Sit to/from stand Lower Body Dressing Details (indicate cue type and reason): min A with shoes Toilet Transfer: Supervision/safety;RW   Toileting- Clothing Manipulation and Hygiene: Supervision/safety;Sit to/from stand       Functional mobility during ADLs: Min guard;Cueing for sequencing;Cueing for safety                  Cognition   Behavior During Therapy: [facility] for tasks assessed/performed Overall Cognitive Status: Within Functional Limits for tasks assessed                               General Comments  pt  Had thrown up this morning and was still feeling a tad queasy    Pertinent Vitals/ Pain       Pain Score: 3  Pain Location: L thigh Pain Descriptors / Indicators: Sore Pain Intervention(s): Monitored during session;Repositioned;Limited activity within patient's tolerance         Frequency       Progress Toward Goals  OT Goals(current goals can now be found in the care plan section)  Progress towards OT goals: Progressing toward goals     Plan Discharge plan remains  appropriate    Co-evaluation                 End of Session Equipment Utilized During Treatment: Rolling walker   Activity Tolerance Patient tolerated treatment well   Patient Left in chair;with call bell/phone within reach   Nurse Communication Mobility status        Time: 1041-1051 OT Time Calculation (min): 10 min  Charges: OT General Charges $OT Visit: 1 Procedure OT Treatments $Self Care/Home Management : 8-22 mins  Margareth Kanner D 11/22/2015, 11:07 AM

## 2015-11-22 NOTE — Progress Notes (Signed)
Subjective: 3 Days Post-Op Procedure(s) (LRB): LEFT TOTAL HIP ARTHROPLASTY ANTERIOR APPROACH (Left) Patient reports pain as moderate.  Working well with therapy.  Objective: Vital signs in last 24 hours: Temp:  [98.4 F (36.9 C)-101.4 F (38.6 C)] 98.4 F (36.9 C) (04/03 0620) Pulse Rate:  [87-101] 89 (04/03 0620) Resp:  [17-18] 18 (04/03 0620) BP: (114-129)/(59-76) 114/59 mmHg (04/03 0620) SpO2:  [95 %-97 %] 95 % (04/03 0620)  Intake/Output from previous day: 04/02 0701 - 04/03 0700 In: 1200 [P.O.:1200] Out: 1250 [Urine:1250] Intake/Output this shift:     Recent Labs  11/20/15 0445  HGB 10.7*    Recent Labs  11/20/15 0445  WBC 8.6  RBC 3.37*  HCT 32.1*  PLT 234    Recent Labs  11/20/15 0445  NA 138  K 4.2  CL 106  CO2 26  BUN 18  CREATININE 0.83  GLUCOSE 122*  CALCIUM 8.1*   No results for input(s): LABPT, INR in the last 72 hours.  Sensation intact distally Intact pulses distally Dorsiflexion/Plantar flexion intact Incision: dressing C/D/I  Assessment/Plan: 3 Days Post-Op Procedure(s) (LRB): LEFT TOTAL HIP ARTHROPLASTY ANTERIOR APPROACH (Left) Up with therapy Discharge home with home health today.  Nayelie Gionfriddo Y 11/22/2015, 7:16 AM

## 2015-11-22 NOTE — Progress Notes (Signed)
Physical Therapy Treatment Patient Details Name: [name] MRN: [id number] DOB: [birth year] Today's Date: 11/22/2015    History of Present Illness L THR; pt with hx of rectal CA with colostomy in place    PT Comments    Progressing with mobility. Will have a 2nd session prior to d/c.  Follow Up Recommendations  Home health PT     Equipment Recommendations  None recommended by PT    Recommendations for Other Services       Precautions / Restrictions Precautions Precautions: Fall Restrictions Weight Bearing Restrictions: No LLE Weight Bearing: Weight bearing as tolerated    Mobility  Bed Mobility               General bed mobility comments: pt in chair  Transfers Overall transfer level: Needs assistance Equipment used: Rolling walker (2 wheeled) Transfers: Sit to/from Stand Sit to Stand: Min guard Stand pivot transfers: Min guard       General transfer comment: close guard for safety. Increased time  Ambulation/Gait Ambulation/Gait assistance: Min guard Ambulation Distance (Feet): 160 Feet Assistive device: Rolling walker (2 wheeled) Gait Pattern/deviations: Step-to pattern;Decreased stride length     General Gait Details: close guard for safety.    Stairs            Wheelchair Mobility    Modified Rankin (Stroke Patients Only)       Balance                                    Cognition Arousal/Alertness: Awake/alert Behavior During Therapy: WFL for tasks assessed/performed Overall Cognitive Status: Within Functional Limits for tasks assessed                      Exercises      General Comments        Pertinent Vitals/Pain Pain Assessment: 0-10 Pain Score: 3  Pain Location: L thigh Pain Descriptors / Indicators: Sore Pain Intervention(s): Monitored during session;Repositioned    Home Living                      Prior Function            PT Goals (current goals can now be found  in the care plan section) Progress towards PT goals: Progressing toward goals    Frequency  7X/week    PT Plan Current plan remains appropriate    Co-evaluation             End of Session   Activity Tolerance: Patient tolerated treatment well Patient left: in chair;with call bell/phone within reach     Time: [id number] PT Time Calculation (min) (ACUTE ONLY): 11 min  Charges:  $Gait Training: 8-22 mins                    G Codes:      Weston Anna, MPT Pager: [phone]

## 2015-11-22 NOTE — Discharge Summary (Signed)
Patient ID: [name] MRN: [id number] DOB/AGE: August 02, 1956 60 y.o.  Admit date: 11/19/2015 Discharge date: 11/22/2015  Admission Diagnoses:  Principal Problem:   Osteoarthritis of left hip Active Problems:   Status post total replacement of left hip   Discharge Diagnoses:  Same  Past Medical History  Diagnosis Date  . GERD (gastroesophageal reflux disease)   . Arthritis   . Aspiration pneumonia ([place]) 03/24/2014  . Hypertension   . Difficulty urinating   . Anxiety   . Colostomy in place [facility])   . Ileostomy present ([place])   . Shortening, leg, congenital     LEFT  . Rectal cancer, uT2uN0, s/p LAR/ileostomy 03/20/2014 colostomy Aug2015 12/25/2013    TCS MAY 2015   . Skin irritation     "around ostomy site" -dry and reddened.    Surgeries: Procedure(s): LEFT TOTAL HIP ARTHROPLASTY ANTERIOR APPROACH on 11/19/2015   Consultants:    Discharged Condition: Improved  Hospital Course: MAYCEN NIEMEIER is an 60 y.o. male who was admitted 11/19/2015 for operative treatment ofOsteoarthritis of left hip. Patient has severe unremitting pain that affects sleep, daily activities, and work/hobbies. After pre-op clearance the patient was taken to the operating room on 11/19/2015 and underwent  Procedure(s): LEFT TOTAL HIP ARTHROPLASTY ANTERIOR APPROACH.    Patient was given perioperative antibiotics: Anti-infectives    Start     Dose/Rate Route Frequency Ordered Stop   11/19/15 2000  ceFAZolin (ANCEF) IVPB 1 g/50 mL premix     1 g 100 mL/hr over 30 Minutes Intravenous Every 6 hours 11/19/15 1714 11/20/15 0257   11/19/15 1126  ceFAZolin (ANCEF) IVPB 2g/100 mL premix  Status:  Discontinued     2 g 200 mL/hr over 30 Minutes Intravenous On call to O.R. 11/19/15 1127 11/19/15 1710       Patient was given sequential compression devices, early ambulation, and chemoprophylaxis to prevent DVT.  Patient benefited maximally from hospital stay and there were no complications.    Recent vital  signs: Patient Vitals for the past 24 hrs:  BP Temp Temp src Pulse Resp SpO2  11/22/15 0620 (!) 114/59 mmHg 98.4 F (36.9 C) Oral 89 18 95 %  11/22/15 0010 - 99.2 F (37.3 C) Oral - - -  11/21/15 2009 128/76 mmHg (!) 101.4 F (38.6 C) Oral (!) 101 17 97 %  11/21/15 1444 116/69 mmHg 99.9 F (37.7 C) Oral 90 18 97 %  11/21/15 0920 129/74 mmHg - - 87 - -     Recent laboratory studies:  Recent Labs  11/20/15 0445  WBC 8.6  HGB 10.7*  HCT 32.1*  PLT 234  NA 138  K 4.2  CL 106  CO2 26  BUN 18  CREATININE 0.83  GLUCOSE 122*  CALCIUM 8.1*     Discharge Medications:     Medication List    STOP taking these medications        etodolac 400 MG tablet  Commonly known as:  LODINE     HYDROcodone-acetaminophen 5-325 MG tablet  Commonly known as:  NORCO/VICODIN      TAKE these medications        aspirin 325 MG EC tablet  Take 1 tablet (325 mg total) by mouth 2 (two) times daily after a meal.     DANDELION ROOT PO  Take 525 mg by mouth 2 (two) times daily.     Fish Oil 1000 MG Caps  Take 1,000 mg by mouth every morning.     IRON  PO  Take 1 tablet by mouth every morning.     LUTEIN PO  Take 25 mg by mouth 2 (two) times daily.     methocarbamol 500 MG tablet  Commonly known as:  ROBAXIN  Take 1 tablet (500 mg total) by mouth every 6 (six) hours as needed for muscle spasms.     metoprolol tartrate 25 MG tablet  Commonly known as:  LOPRESSOR  TAKE 1 TABLET BY MOUTH TWICE DAILY     omeprazole 20 MG capsule  Commonly known as:  PRILOSEC  Take 1 capsule (20 mg total) by mouth 2 (two) times daily.     ONE DAILY FOR MEN 50+ ADVANCED PO  Take 1 tablet by mouth every morning.     oxyCODONE-acetaminophen 5-325 MG tablet  Commonly known as:  ROXICET  Take 1-2 tablets by mouth every 4 (four) hours as needed.     saw palmetto 500 MG capsule  Take 500 mg by mouth 2 (two) times daily.     Turmeric 500 MG Caps  Take 1 capsule by mouth 2 (two) times daily.      vitamin C 500 MG tablet  Commonly known as:  ASCORBIC ACID  Take 500 mg by mouth 2 (two) times daily.        Diagnostic Studies: Dg C-arm 61-120 Min-no Report  11/19/2015  CLINICAL DATA: SURGERY C-ARM 61-120 MINUTES Fluoroscopy was utilized by the requesting physician.  No radiographic interpretation.   Dg Hip Port Unilat With Pelvis 1v Left  11/19/2015  CLINICAL DATA:  Status post total placement of left hip EXAM: DG HIP (WITH OR WITHOUT PELVIS) 1V PORT LEFT COMPARISON:  Intraoperative left hip radiographs dated 11/19/2015 FINDINGS: Left hip arthroplasty in satisfactory position. Associated soft tissue gas and overlying skin staples. Mild degenerative changes of the right hip. Visualized bony pelvis appears intact. IMPRESSION: Left hip arthroplasty in satisfactory position. Electronically Signed   By: Julian Hy M.D.   On: 11/19/2015 16:05   Dg Hip Operative Unilat With Pelvis Left  11/19/2015  CLINICAL DATA:  Anterior approach left total hip arthroplasty EXAM: DG C-ARM 61-120 MIN-NO REPORT; OPERATIVE LEFT HIP WITH PELVIS COMPARISON:  09/28/2015 left hip radiographs FINDINGS: Fluoroscopy time 55 seconds. Spot fluoroscopic nondiagnostic intraoperative radiographs demonstrate postsurgical changes from left total hip arthroplasty. IMPRESSION: Intraoperative fluoroscopic guidance for left total hip arthroplasty. Electronically Signed   By: Ilona Sorrel M.D.   On: 11/19/2015 15:12    Disposition: 01-Home or Self Care      Discharge Instructions    Discharge patient    Complete by:  As directed            Follow-up Information    Follow up with Mcarthur Rossetti, MD In 2 weeks.   Specialty:  Orthopedic Surgery   Contact information:   [place] [address] [phone]       Follow up with [facility].   Why:  Home Health Physical Therapy   Contact information:   [address] [place] [address] [phone]         Signed: Mcarthur Rossetti 11/22/2015, 7:17 AM

## 2015-11-30 ENCOUNTER — Encounter: Payer: Self-pay | Admitting: Gastroenterology

## 2015-12-29 ENCOUNTER — Encounter: Payer: Self-pay | Admitting: Gastroenterology

## 2015-12-29 ENCOUNTER — Ambulatory Visit (INDEPENDENT_AMBULATORY_CARE_PROVIDER_SITE_OTHER): Payer: BLUE CROSS/BLUE SHIELD | Admitting: Gastroenterology

## 2015-12-29 VITALS — BP 117/78 | HR 74 | Temp 97.6°F | Ht 65.0 in | Wt 186.2 lb

## 2015-12-29 DIAGNOSIS — C20 Malignant neoplasm of rectum: Secondary | ICD-10-CM

## 2015-12-29 NOTE — Patient Instructions (Signed)
DRINK WATER TO KEEP YOUR URINE LIGHT YELLOW.  FOLLOW A HIGH FIBER DIET. AVOID ITEMS THAT CAUSE BLOATING & GAS. SEE INFO BELOW.  FOLLOW UP IN 1-2 YEAR. PLEASE CALL WITH QUESTIONS OR CONCERNS.  NEXT COLONOSCOPY IN JUN 2019. YOUR SISTERS, BROTHERS, CHILDREN, AND PARENTS NEED TO HAVE A COLONOSCOPY STARTING AT THE AGE OF 40 and then every 5 years.  High-Fiber Diet A high-fiber diet changes your normal diet to include more whole grains, legumes, fruits, and vegetables. Changes in the diet involve replacing refined carbohydrates with unrefined foods. The calorie level of the diet is essentially unchanged. The Dietary Reference Intake (recommended amount) for adult males is 38 grams per day. For adult females, it is 25 grams per day. Pregnant and lactating women should consume 28 grams of fiber per day. Fiber is the intact part of a plant that is not broken down during digestion. Functional fiber is fiber that has been isolated from the plant to provide a beneficial effect in the body.  PURPOSE  Increase stool bulk.   Ease and regulate bowel movements.   Lower cholesterol.  REDUCE RISK OF COLON CANCER  INDICATIONS THAT YOU NEED MORE FIBER  Constipation and hemorrhoids.   Uncomplicated diverticulosis (intestine condition) and irritable bowel syndrome.   Weight management.   As a protective measure against hardening of the arteries (atherosclerosis), diabetes, and cancer.   GUIDELINES FOR INCREASING FIBER IN THE DIET  Start adding fiber to the diet slowly. A gradual increase of about 5 more grams (2 slices of whole-wheat bread, 2 servings of most fruits or vegetables, or 1 bowl of high-fiber cereal) per day is best. Too rapid an increase in fiber may result in constipation, flatulence, and bloating.   Drink enough water and fluids to keep your urine clear or pale yellow. Water, juice, or caffeine-free drinks are recommended. Not drinking enough fluid may cause constipation.   Eat a variety  of high-fiber foods rather than one type of fiber.   Try to increase your intake of fiber through using high-fiber foods rather than fiber pills or supplements that contain small amounts of fiber.   The goal is to change the types of food eaten. Do not supplement your present diet with high-fiber foods, but replace foods in your present diet.  INCLUDE A VARIETY OF FIBER SOURCES  Replace refined and processed grains with whole grains, canned fruits with fresh fruits, and incorporate other fiber sources. White rice, white breads, and most bakery goods contain little or no fiber.   Brown whole-grain rice, buckwheat oats, and many fruits and vegetables are all good sources of fiber. These include: broccoli, Brussels sprouts, cabbage, cauliflower, beets, sweet potatoes, white potatoes (skin on), carrots, tomatoes, eggplant, squash, berries, fresh fruits, and dried fruits.   Cereals appear to be the richest source of fiber. Cereal fiber is found in whole grains and bran. Bran is the fiber-rich outer coat of cereal grain, which is largely removed in refining. In whole-grain cereals, the bran remains. In breakfast cereals, the largest amount of fiber is found in those with "bran" in their names. The fiber content is sometimes indicated on the label.   You may need to include additional fruits and vegetables each day.   In baking, for 1 cup white flour, you may use the following substitutions:   1 cup whole-wheat flour minus 2 tablespoons.   1/2 cup white flour plus 1/2 cup whole-wheat flour.

## 2015-12-29 NOTE — Progress Notes (Signed)
cc'ed to pcp °

## 2015-12-29 NOTE — Progress Notes (Signed)
ON RECALL  °

## 2015-12-29 NOTE — Progress Notes (Signed)
Subjective:    Patient ID: [name], male    DOB: 1956-03-27, 60 y.o.   MRN: [id number]  Mickie Hillier, MD  HPI HAS TROUBLE GETTING HIS BREATH. HAD L HIP REPLACEMENT. HAS ACCEPTED HAVING THE COLOSTOMY. EMPTIES BAG: 3X/DAY-FORMED/SOFT. HAS A SYSTEM TO KEEP BAG CLEAN. STILL TENDING TO 90 HEAD OF CATTLE. HEARTBURN CONTROLLED WITH PRILOSEC. PT DENIES FEVER, CHILLS, HEMATOCHEZIA, HEMATEMESIS, nausea, vomiting, melena, diarrhea, CHEST PAIN, SHORTNESS OF BREATH, CHANGE IN BOWEL IN HABITS, constipation, abdominal pain, problems swallowing, problems with sedation, OR heartburn or indigestion.  Past Medical History  Diagnosis Date  . GERD (gastroesophageal reflux disease)   . Arthritis   . Aspiration pneumonia ([place]) 03/24/2014  . Hypertension   . Difficulty urinating   . Anxiety   . Colostomy in place [facility])   . Ileostomy present ([place])   . Shortening, leg, congenital     LEFT  . Rectal cancer, uT2uN0, s/p LAR/ileostomy 03/20/2014 colostomy Aug2015 12/25/2013    TCS MAY 2015   . Skin irritation     "around ostomy site" -dry and reddened.   Past Surgical History  Procedure Laterality Date  . Colonoscopy N/A 12/22/2013    SLF: 1. Normal mucosa in the terminal ilium. 2. Moderate diverticulosis in the transverse colon, descending colon, and sigmoid colon. 3. Rectal bleeding due ot rectal mass and hemorrhoids 4. Moderate sized internal hemorrhoids.   . Esophagogastroduodenoscopy N/A 12/22/2013    SLF: Probable Candia esophagitis2. Probable Barretts esphagus 3. Mild gastritis.   . Eus N/A 01/01/2014    Procedure: LOWER ENDOSCOPIC ULTRASOUND (EUS);  Surgeon: Milus Banister, MD;  Location: [facility];  Service: Endoscopy;  Laterality: N/A;  . Hip fracture surgery Left age 65 on 04-06-1970  . Eye reattachment and ear reattachment  Right 1971  . Colon resection N/A 03/24/2014    Procedure: Diagnostic  laparotomy, takedown of coloanal anastamosis, end colostomy, with wound vac;  Surgeon: Adin Hector, MD;  Location: [facility];  Service: General;  Laterality: N/A;  . Flexible sigmoidoscopy N/A 03/24/2014    Procedure: FLEXIBLE SIGMOIDOSCOPY;  Surgeon: Adin Hector, MD;  Location: [facility];  Service: General;  Laterality: N/A;  . Ileostomy    . Colostomy    . Ileostomy closure N/A 08/25/2014    Procedure: TAKEDOWN OF LOOP ILEOSTOMY ;  Surgeon: Michael Boston, MD;  Location: [facility];  Service: General;  Laterality: N/A;  . Examination under anesthesia  08/25/2014    Procedure: Jasmine December UNDER ANESTHESIA;  Surgeon: Michael Boston, MD;  Location: [facility];  Service: General;;  . Colonoscopy N/A 01/19/2015    Procedure: COLONOSCOPY;  Surgeon: Danie Binder, MD;  Location: [facility];  Service: Endoscopy;  Laterality: N/A;  1215  . Total hip arthroplasty Left 11/19/2015    Procedure: LEFT TOTAL HIP ARTHROPLASTY ANTERIOR APPROACH;  Surgeon: Mcarthur Rossetti, MD;  Location: [facility];  Service: Orthopedics;  Laterality: Left;   No Known Allergies  Current Outpatient Prescriptions  Medication Sig Dispense Refill  . aspirin EC 325 MG EC tablet Take 1 tablet (325 mg total) by mouth 2 (two) times daily after a meal.    . IRON PO Take 1 tablet by mouth every morning.    . LUTEIN PO Take 25 mg by mouth 2 (two) times daily.    Marland Kitchen ROBAXIN 500 MG tablet Take 1 tablet (500 mg total) by mouth every 6 (six) hours as needed for muscle spasms.    Marland Kitchen LOPRESSOR 25 MG tablet  TAKE 1 TABLET BY MOUTH TWICE DAILY    . DANDELION ROOT PO Take 525 mg by mouth 2 (two) times daily.    . Multiple Vitamins-Minerals Take 1 tablet by mouth every morning.     Marland Kitchen FISH OIL 1000 MG CAPS Take 1,000 mg by mouth every morning.     Marland Kitchen PRILOSEC 20 MG capsule Take 1 capsule (20 mg total) by mouth 2 (two) times daily.    . saw palmetto 500 MG capsule Take 500 mg by mouth 2 (two) times daily.    . Turmeric 500 MG CAPS Take 1 capsule by mouth 2 (two) times daily.    . ASCORBIC ACID 500 MG tablet Take 500 mg by mouth 2 (two) times daily.      Review of Systems PER HPI OTHERWISE ALL SYSTEMS ARE NEGATIVE.    Objective:   Physical Exam  Constitutional: He is oriented to person, place, and time. He appears well-developed and well-nourished. No distress.  HENT:  Head: Normocephalic and atraumatic.  Mouth/Throat: Oropharynx is clear and moist. No oropharyngeal exudate.  Eyes: Pupils are equal, round, and reactive to light. No scleral icterus.  Neck: Normal range of motion. Neck supple.  Cardiovascular: Normal rate, regular rhythm and normal heart sounds.   Pulmonary/Chest: Effort normal and breath sounds normal. No respiratory distress.  Abdominal: Soft. Bowel sounds are normal. He exhibits no distension. There is no tenderness.  OSTOMY IN LLQ  Musculoskeletal: He exhibits no edema.  Lymphadenopathy:    He has no cervical adenopathy.  Neurological: He is alert and oriented to person, place, and time.  NO  NEW FOCAL DEFICITS  Psychiatric: He has a normal mood and affect.  Vitals reviewed.     Assessment & Plan:

## 2015-12-29 NOTE — Assessment & Plan Note (Signed)
NO WARNING SIGNS/SYMPTOMS. NO BRBPR OR MELENA.  DRINK WATER TO KEEP YOUR URINE LIGHT YELLOW. FOLLOW A HIGH FIBER DIET. AVOID ITEMS THAT CAUSE BLOATING & GAS.  HANDOUT GIVEN. FOLLOW UP IN 1-2 YEAR. CALL WITH QUESTIONS OR CONCERNS.  NEXT COLONOSCOPY IN JUN 2019. YOUR SISTERS, BROTHERS, CHILDREN, AND PARENTS NEED TO HAVE A COLONOSCOPY STARTING AT THE AGE OF 40 and then every 5 years.

## 2016-01-04 ENCOUNTER — Ambulatory Visit (HOSPITAL_COMMUNITY): Payer: BLUE CROSS/BLUE SHIELD | Attending: Orthopaedic Surgery | Admitting: Physical Therapy

## 2016-01-04 DIAGNOSIS — R208 Other disturbances of skin sensation: Secondary | ICD-10-CM | POA: Insufficient documentation

## 2016-01-04 DIAGNOSIS — M5412 Radiculopathy, cervical region: Secondary | ICD-10-CM | POA: Diagnosis present

## 2016-01-04 DIAGNOSIS — R29898 Other symptoms and signs involving the musculoskeletal system: Secondary | ICD-10-CM | POA: Insufficient documentation
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
## 2016-01-04 NOTE — Patient Instructions (Signed)
Scapular Retraction (Standing)    With arms at sides, pinch shoulder blades together. Repeat __10__ times per set. Do __1__ sets per session. Do __4__ sessions per day.  http://orth.exer.us/944   Copyright  VHI. All rights reserved.  AROM: Neck Extension    Bend head backward. Hold _2___ seconds. Repeat 10____ times per set. Do _1___ sets per session. Do __4__ sessions per day.  http://orth.exer.us/300   Copyright  VHI. All rights reserved.  AROM: Lateral Neck Flexion    Slowly tilt head toward Left shoulder, . Hold  __3__ seconds. Repeat _10___ times per set. Do _1___ sets per session. Do 4__ sessions per day.  http://orth.exer.us/296   Copyright  VHI. All rights reserved.

## 2016-01-05 ENCOUNTER — Ambulatory Visit (HOSPITAL_COMMUNITY): Payer: BLUE CROSS/BLUE SHIELD | Admitting: Physical Therapy

## 2016-01-05 DIAGNOSIS — R29898 Other symptoms and signs involving the musculoskeletal system: Secondary | ICD-10-CM

## 2016-01-05 DIAGNOSIS — M5412 Radiculopathy, cervical region: Secondary | ICD-10-CM | POA: Diagnosis not present

## 2016-01-05 DIAGNOSIS — R208 Other disturbances of skin sensation: Secondary | ICD-10-CM

## 2016-01-05 NOTE — Therapy (Signed)
[place], [place], [address] Phone: [phone]   Fax:  [phone]  Physical Therapy Treatment  Patient Details  Name: [name] MRN: [id number] Date of Birth: Jan 10, 1956 Referring Provider: Jean Rosenthal  Encounter Date: 01/05/2016      PT End of Session - 01/05/16 1203    Visit Number 2   Number of Visits 12   Date for PT Re-Evaluation 02/03/16   Authorization Type BCBS   Authorization - Visit Number 2   Authorization - Number of Visits 10   PT Start Time 0950   PT Stop Time 1035   PT Time Calculation (min) 45 min   Activity Tolerance Patient tolerated treatment well      Past Medical History  Diagnosis Date  . GERD (gastroesophageal reflux disease)   . Arthritis   . Aspiration pneumonia ([place]) 03/24/2014  . Hypertension   . Difficulty urinating   . Anxiety   . Colostomy in place [facility])   . Ileostomy present ([place])   . Shortening, leg, congenital     LEFT  . Rectal cancer, uT2uN0, s/p LAR/ileostomy 03/20/2014 colostomy Aug2015 12/25/2013    TCS MAY 2015   . Skin irritation     "around ostomy site" -dry and reddened.    Past Surgical History  Procedure Laterality Date  . Colonoscopy N/A 12/22/2013    SLF: 1. Normal mucosa in the terminal ilium. 2. Moderate diverticulosis in the transverse colon, descending colon, and sigmoid colon. 3. Rectal bleeding due ot rectal mass and hemorrhoids 4. Moderate sized internal hemorrhoids.   . Esophagogastroduodenoscopy N/A 12/22/2013    SLF: Probable Candia esophagitis2. Probable Barretts esphagus 3. Mild gastritis.   . Eus N/A 01/01/2014    Procedure: LOWER ENDOSCOPIC ULTRASOUND (EUS);  Surgeon: Milus Banister, MD;  Location: [facility];  Service: Endoscopy;  Laterality: N/A;  . Hip fracture surgery Left age 3 on 04-06-1970  . Eye reattachment and ear reattachment  Right 1971  . Colon resection N/A 03/24/2014    Procedure: Diagnostic  laparotomy,  takedown of coloanal anastamosis, end colostomy, with wound vac;  Surgeon: Adin Hector, MD;  [facility];  Service: General;  Laterality: N/A;  . Flexible sigmoidoscopy N/A 03/24/2014    Procedure: FLEXIBLE SIGMOIDOSCOPY;  Surgeon: Adin Hector, MD;  [facility];  Service: General;  Laterality: N/A;  . Ileostomy    . Colostomy    . Ileostomy closure N/A 08/25/2014    Procedure: TAKEDOWN OF LOOP ILEOSTOMY ;  Surgeon: Michael Boston, MD;  [facility];  Service: General;  Laterality: N/A;  . Examination under anesthesia  08/25/2014    Procedure: Jasmine December UNDER ANESTHESIA;  Surgeon: Michael Boston, MD;  [facility];  Service: General;;  . Colonoscopy N/A 01/19/2015    Procedure: COLONOSCOPY;  Surgeon: Danie Binder, MD;  [facility];  Service: Endoscopy;  Laterality: N/A;  1215  . Total hip arthroplasty Left 11/19/2015    Procedure: LEFT TOTAL HIP ARTHROPLASTY ANTERIOR APPROACH;  Surgeon: Mcarthur Rossetti, MD;  [facility];  Service: Orthopedics;  Laterality: Left;    There were no vitals filed for this visit.      Subjective Assessment - 01/05/16 1158    Subjective PT states his numbness persist in his Lt ring and pinky fingers.  States all other issues are good today.  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  STates he was  able to complete this but with some discomfort.    Currently in Pain? Yes   Pain Score 3    Pain Location Neck   Pain Orientation Left   Pain Descriptors / Indicators Aching;Burning   Pain Type Chronic pain            [facility] PT Assessment - 01/05/16 0001    Assessment   Medical Diagnosis cervical pain Left greater than right    AROM   Right/Left Shoulder Left   Left Shoulder Flexion 154 Degrees              [facility] Adult PT Treatment/Exercise - 01/05/16 0001    Neck Exercises: Theraband   Scapula Retraction Green;10 reps   Shoulder Extension Green;10 reps   Rows Green;10 reps   Horizontal  ADduction 10 reps;Green   Manual Therapy   Manual Therapy Manual Traction;Passive ROM   Manual therapy comments cervical in supine 30 second holds X 5   Passive ROM Lt shoulder flexion in supine, AAROM with cane                PT Education - 01/05/16 1204    Education provided Yes   Education Details given copy of initial evaluation, reviewed goals and HEP   Person(s) Educated Patient   Methods Explanation;Handout;Demonstration   Comprehension Verbalized understanding;Returned demonstration          PT Short Term Goals - 01/05/16 1204    PT SHORT TERM GOAL #1   Title Pt to be Independent in a home exercise program to reduce pain to no greater than a 5/10 to alllow improved ROM    Time 3   Period Weeks   Status On-going   PT SHORT TERM GOAL #2   Title Pt to be demonstrate functional left cervical rotation to allow paient to be able to see the blind side when driving    Time 3   Period Weeks   Status On-going   PT SHORT TERM GOAL #3   Title Pt to be able to verbalize the importance of correct sitting posture in neck care    Time 3   Period Weeks   Status On-going           PT Long Term Goals - 01/05/16 1204    PT LONG TERM GOAL #1   Title Pt Lt ring and little finger numbness  to be gone  to allow pt to sleep without waking up.    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    Time 6   Period Weeks   Status On-going   PT LONG TERM GOAL #4   Title Lt UE shoulder flexion to be WNL in order for patient to be able to throw a softball overhead to grand daughter   Baseline currently with 156 degrees  flexion   Time 6   Period Weeks   Status New               Plan -  01/05/16 1206    Clinical Impression Statement Reviewed HEP and given copy of initial evaluation.  Pt mentioned he would like to add a goal of being able to throw a softball with his daughter.  He is Lt UE dominant and is unable to throw at this time due to restricted ROM.  flexion measured at 156 degrees today.  Added LTG for patient regarding this.  Initiated theraband postural and UE sterngtheing exercises as well as beginning manual traction with noted relief in symptoms.  Did not update HEP this session   Rehab Potential Good   PT Frequency 2x / week   PT Duration 6 weeks   PT Treatment/Interventions ADLs/Self Care Home Management;Electrical Stimulation;Moist Heat;Traction;Ultrasound;Therapeutic exercise;Patient/family education;Manual techniques;Passive range of motion   PT Next Visit Plan Progress with cervical retraction, seated Lt UE strengthening and stretching therex.  Continue with manual traction with progression to mechanical if helping.     PT Home Exercise Plan given   Consulted and Agree with Plan of Care Patient      Patient will benefit from skilled therapeutic intervention in order to improve the following deficits and impairments:  Decreased strength, Decreased range of motion, Postural dysfunction, Pain, Increased muscle spasms  Visit Diagnosis: Other symptoms and signs involving the musculoskeletal system  Other disturbances of skin sensation  Radiculopathy, cervical region     Problem List Patient Active Problem List   Diagnosis Date Noted  . Osteoarthritis of left hip 11/19/2015  . Status post total replacement of left hip 11/19/2015  . Avascular necrosis of bone of left hip ([place]) 10/06/2015  . Acquired inequality of length of lower legs 10/06/2015  . Colostomy in place [facility])   . Essential hypertension, benign 05/12/2014  . Tachycardia 04/14/2014  . Thrombocytosis ([place])  04/14/2014  . Eustachian tube dysfunction 04/07/2014  . Obesity (BMI 30-39.9) 03/24/2014  . Rectal cancer, uT2uN0, s/p LAR/ileostomy 03/20/2014 colostomy [id number] 12/25/2013  . Hemorrhoids, internal 12/25/2013  . GERD (gastroesophageal reflux disease) 11/27/2013  . Atypical chest pain 11/10/2013  . Tobacco abuse, in remission 11/10/2013    Teena Irani, PTA/CLT [phone]  01/05/2016, 12:11 PM  [facility] [address] [place], [place], [address] Phone: [phone]   Fax:  [phone]  Name: [name] MRN: [id number] Date of Birth: [birth year]    Rayetta Humphrey, [place] CLT [phone]

## 2016-01-10 ENCOUNTER — Ambulatory Visit (HOSPITAL_COMMUNITY): Payer: BLUE CROSS/BLUE SHIELD | Admitting: Physical Therapy

## 2016-01-10 DIAGNOSIS — M5412 Radiculopathy, cervical region: Secondary | ICD-10-CM

## 2016-01-10 DIAGNOSIS — R208 Other disturbances of skin sensation: Secondary | ICD-10-CM

## 2016-01-10 DIAGNOSIS — R29898 Other symptoms and signs involving the musculoskeletal system: Secondary | ICD-10-CM

## 2016-01-10 NOTE — Therapy (Signed)
[place], [place], [address] Phone: [phone]   Fax:  [phone]  Physical Therapy Treatment  Patient Details  Name: [name] MRN: [id number] Date of Birth: Aug 15, 1956 Referring Provider: Jean Rosenthal  Encounter Date: 01/10/2016      PT End of Session - 01/10/16 1034    Visit Number 3   Number of Visits 12   Date for PT Re-Evaluation 02/03/16   Authorization Type BCBS   Authorization - Visit Number 3   Authorization - Number of Visits 10   PT Start Time 0900   PT Stop Time 0950   PT Time Calculation (min) 50 min   Activity Tolerance Patient tolerated treatment well   Behavior During Therapy [facility] for tasks assessed/performed      Past Medical History  Diagnosis Date  . GERD (gastroesophageal reflux disease)   . Arthritis   . Aspiration pneumonia ([place]) 03/24/2014  . Hypertension   . Difficulty urinating   . Anxiety   . Colostomy in place [facility])   . Ileostomy present ([place])   . Shortening, leg, congenital     LEFT  . Rectal cancer, uT2uN0, s/p LAR/ileostomy 03/20/2014 colostomy Aug2015 12/25/2013    TCS MAY 2015   . Skin irritation     "around ostomy site" -dry and reddened.    Past Surgical History  Procedure Laterality Date  . Colonoscopy N/A 12/22/2013    SLF: 1. Normal mucosa in the terminal ilium. 2. Moderate diverticulosis in the transverse colon, descending colon, and sigmoid colon. 3. Rectal bleeding due ot rectal mass and hemorrhoids 4. Moderate sized internal hemorrhoids.   . Esophagogastroduodenoscopy N/A 12/22/2013    SLF: Probable Candia esophagitis2. Probable Barretts esphagus 3. Mild gastritis.   . Eus N/A 01/01/2014    Procedure: LOWER ENDOSCOPIC ULTRASOUND (EUS);  Surgeon: Milus Banister, MD;  [facility];  Service: Endoscopy;  Laterality: N/A;  . Hip fracture surgery Left age 51 on 04-06-1970  . Eye reattachment and ear reattachment  Right 1971  . Colon  resection N/A 03/24/2014    Procedure: Diagnostic  laparotomy, takedown of coloanal anastamosis, end colostomy, with wound vac;  Surgeon: Adin Hector, MD;  [facility];  Service: General;  Laterality: N/A;  . Flexible sigmoidoscopy N/A 03/24/2014    Procedure: FLEXIBLE SIGMOIDOSCOPY;  Surgeon: Adin Hector, MD;  [facility];  Service: General;  Laterality: N/A;  . Ileostomy    . Colostomy    . Ileostomy closure N/A 08/25/2014    Procedure: TAKEDOWN OF LOOP ILEOSTOMY ;  Surgeon: Michael Boston, MD;  [facility];  Service: General;  Laterality: N/A;  . Examination under anesthesia  08/25/2014    Procedure: Jasmine December UNDER ANESTHESIA;  Surgeon: Michael Boston, MD;  [facility];  Service: General;;  . Colonoscopy N/A 01/19/2015    Procedure: COLONOSCOPY;  Surgeon: Danie Binder, MD;  [facility];  Service: Endoscopy;  Laterality: N/A;  1215  . Total hip arthroplasty Left 11/19/2015    Procedure: LEFT TOTAL HIP ARTHROPLASTY ANTERIOR APPROACH;  Surgeon: Mcarthur Rossetti, MD;  [facility];  Service: Orthopedics;  Laterality: Left;    There were no vitals filed for this visit.      Subjective Assessment - 01/10/16 0932    Subjective Pt states he is doing about the same.  States he didnt have as much pain into his ring and pinky fingers, however still having the numbness.  Believes thte traction has helped.  completing his HEP.    Currently in Pain? Yes   Pain Score 3    Pain Location Neck   Pain Orientation Left   Pain Descriptors / Indicators Aching;Burning   Pain Radiating Towards into Lt shoulder and numbness into ring and pinky fingers                         [facility] Adult PT Treatment/Exercise - 01/10/16 1026    Neck Exercises: Machines for Strengthening   UBE (Upper Arm Bike) 5 minutes fwd/bkwd   Neck Exercises: Theraband   Scapula Retraction Green;10 reps   Shoulder Extension Green;10 reps   Rows Green;10 reps   Neck Exercises:  Standing   Upper Extremity Flexion with Stabilization 10 reps   UE Flexion with Stabilization Limitations 2# dumbells   Other Standing Exercises horizontal raise 2# weights 10 reps   Neck Exercises: Seated   Other Seated Exercise push ups with handles  2X10reps   Manual Therapy   Manual Therapy Manual Traction;Passive ROM   Manual therapy comments cervical in supine 30 second holds X 5   Passive ROM Lt shoulder flexion in supine, AAROM with cane                  PT Short Term Goals - 01/05/16 1204    PT SHORT TERM GOAL #1   Title Pt to be Independent in a home exercise program to reduce pain to no greater than a 5/10 to alllow improved ROM    Time 3   Period Weeks   Status On-going   PT SHORT TERM GOAL #2   Title Pt to be demonstrate functional left cervical rotation to allow paient to be able to see the blind side when driving    Time 3   Period Weeks   Status On-going   PT SHORT TERM GOAL #3   Title Pt to be able to verbalize the importance of correct sitting posture in neck care    Time 3   Period Weeks   Status On-going           PT Long Term Goals - 01/05/16 1204    PT LONG TERM GOAL #1   Title Pt Lt ring and little finger numbness  to be gone  to allow pt to sleep without waking up.    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    Time 6   Period Weeks   Status On-going   PT LONG TERM GOAL #4   Title Lt UE shoulder flexion to be WNL in order for patient to be able to throw a softball overhead to grand daughter   Baseline currently with 156 degrees flexion   Time 6   Period Weeks   Status  New               Plan - 01/10/16 1035    Clinical Impression Statement Pt with much  improved ROM in Lt UE as he has been working on it over the weekend.  Progressed with seated dips and UE flexion/abduction to further strengthen UE musculature.  continued with theraband postural strengthening and given theraband/instructions for HEP.  continued with manual traction to cervical region with noted improvement in radiating symptoms.  Pt may benefit from mechanical traction next session.     Rehab Potential Good   PT Frequency 2x / week   PT Duration 6 weeks   PT Treatment/Interventions ADLs/Self Care Home Management;Electrical Stimulation;Moist Heat;Traction;Ultrasound;Therapeutic exercise;Patient/family education;Manual techniques;Passive range of motion   PT Next Visit Plan Progress strengthening exercises and ROM of Lt UE.  Continue with manual traction with progression to mechanical if helping.     PT Home Exercise Plan given for theraband   Consulted and Agree with Plan of Care Patient      Patient will benefit from skilled therapeutic intervention in order to improve the following deficits and impairments:  Decreased strength, Decreased range of motion, Postural dysfunction, Pain, Increased muscle spasms  Visit Diagnosis: Other symptoms and signs involving the musculoskeletal system  Other disturbances of skin sensation  Radiculopathy, cervical region     Problem List Patient Active Problem List   Diagnosis Date Noted  . Osteoarthritis of left hip 11/19/2015  . Status post total replacement of left hip 11/19/2015  . Avascular necrosis of bone of left hip ([place]) 10/06/2015  . Acquired inequality of length of lower legs 10/06/2015  . Colostomy in place [facility])   . Essential hypertension, benign 05/12/2014  . Tachycardia 04/14/2014  . Thrombocytosis ([place]) 04/14/2014  . Eustachian tube dysfunction 04/07/2014  . Obesity (BMI 30-39.9) 03/24/2014  . Rectal cancer, uT2uN0, s/p  LAR/ileostomy 03/20/2014 colostomy [id number] 12/25/2013  . Hemorrhoids, internal 12/25/2013  . GERD (gastroesophageal reflux disease) 11/27/2013  . Atypical chest pain 11/10/2013  . Tobacco abuse, in remission 11/10/2013    Teena Irani, PTA/CLT [phone]  01/10/2016, 10:40 AM  [place], [place], [address] Phone: [phone]   Fax:  [phone]  Name: JAHKYE APA MRN: [id number] Date of Birth: 20-Jul-1956

## 2016-01-13 ENCOUNTER — Ambulatory Visit (HOSPITAL_COMMUNITY): Payer: BLUE CROSS/BLUE SHIELD | Admitting: Physical Therapy

## 2016-01-13 DIAGNOSIS — R29898 Other symptoms and signs involving the musculoskeletal system: Secondary | ICD-10-CM

## 2016-01-13 DIAGNOSIS — M5412 Radiculopathy, cervical region: Secondary | ICD-10-CM | POA: Diagnosis not present

## 2016-01-13 DIAGNOSIS — R208 Other disturbances of skin sensation: Secondary | ICD-10-CM

## 2016-01-13 NOTE — Therapy (Signed)
[place], [place], [address] Phone: [phone]   Fax:  [phone]  Physical Therapy Treatment  Patient Details  Name: [name] MRN: [id number] Date of Birth: [birth year] Referring Provider: Jean Rosenthal  Encounter Date: 01/13/2016      PT End of Session - 01/13/16 0901    Visit Number 4   Number of Visits 12   Date for PT Re-Evaluation 02/03/16   Authorization Type BCBS   Authorization - Visit Number 4   Authorization - Number of Visits 10   PT Start Time 0815   PT Stop Time 0905   PT Time Calculation (min) 50 min   Activity Tolerance Patient tolerated treatment well   Behavior During Therapy [facility] for tasks assessed/performed      Past Medical History  Diagnosis Date  . GERD (gastroesophageal reflux disease)   . Arthritis   . Aspiration pneumonia ([place]) 03/24/2014  . Hypertension   . Difficulty urinating   . Anxiety   . Colostomy in place [facility])   . Ileostomy present ([place])   . Shortening, leg, congenital     LEFT  . Rectal cancer, uT2uN0, s/p LAR/ileostomy 03/20/2014 colostomy Aug2015 12/25/2013    TCS MAY 2015   . Skin irritation     "around ostomy site" -dry and reddened.    Past Surgical History  Procedure Laterality Date  . Colonoscopy N/A 12/22/2013    SLF: 1. Normal mucosa in the terminal ilium. 2. Moderate diverticulosis in the transverse colon, descending colon, and sigmoid colon. 3. Rectal bleeding due ot rectal mass and hemorrhoids 4. Moderate sized internal hemorrhoids.   . Esophagogastroduodenoscopy N/A 12/22/2013    SLF: Probable Candia esophagitis2. Probable Barretts esphagus 3. Mild gastritis.   . Eus N/A 01/01/2014    Procedure: LOWER ENDOSCOPIC ULTRASOUND (EUS);  Surgeon: Milus Banister, MD;  [facility];  Service: Endoscopy;  Laterality: N/A;  . Hip fracture surgery Left age 60 on 04-06-1970  . Eye reattachment and ear reattachment  Right 1971  . Colon  resection N/A 03/24/2014    Procedure: Diagnostic  laparotomy, takedown of coloanal anastamosis, end colostomy, with wound vac;  Surgeon: Adin Hector, MD;  [facility];  Service: General;  Laterality: N/A;  . Flexible sigmoidoscopy N/A 03/24/2014    Procedure: FLEXIBLE SIGMOIDOSCOPY;  Surgeon: Adin Hector, MD;  [facility];  Service: General;  Laterality: N/A;  . Ileostomy    . Colostomy    . Ileostomy closure N/A 08/25/2014    Procedure: TAKEDOWN OF LOOP ILEOSTOMY ;  Surgeon: Michael Boston, MD;  [facility];  Service: General;  Laterality: N/A;  . Examination under anesthesia  08/25/2014    Procedure: Jasmine December UNDER ANESTHESIA;  Surgeon: Michael Boston, MD;  [facility];  Service: General;;  . Colonoscopy N/A 01/19/2015    Procedure: COLONOSCOPY;  Surgeon: Danie Binder, MD;  [facility];  Service: Endoscopy;  Laterality: N/A;  1215  . Total hip arthroplasty Left 11/19/2015    Procedure: LEFT TOTAL HIP ARTHROPLASTY ANTERIOR APPROACH;  Surgeon: Mcarthur Rossetti, MD;  [facility];  Service: Orthopedics;  Laterality: Left;    There were no vitals filed for this visit.      Subjective Assessment - 01/13/16 0831    Subjective Pt states he is overall better, however his ring and pinky finger is remaining numb and tingly.  States he has no pain and his cervical ROM has  improved.   Currently in Pain? No/denies                         [facility] Adult PT Treatment/Exercise - 01/13/16 0832    Neck Exercises: Machines for Strengthening   UBE (Upper Arm Bike) 5 minutes fwd/bkwd   Neck Exercises: Theraband   Scapula Retraction Green;15 reps   Shoulder Extension Green;15 reps   Rows Green;15 reps   Neck Exercises: Standing   Upper Extremity Flexion with Stabilization 10 reps   UE Flexion with Stabilization Limitations 2# dumbells   Other Standing Exercises lateral raise 2# weights 10 reps   Other Standing Exercises doorway stretch for Lt UE    Modalities   Modalities Traction;Moist Heat   Moist Heat Therapy   Number Minutes Moist Heat 15 Minutes   Moist Heat Location Cervical   Traction   Type of Traction Cervical   Min (lbs) 0   Max (lbs) 12   Hold Time static   Rest Time n/a   Time 59                  PT Short Term Goals - 01/05/16 1204    PT SHORT TERM GOAL #1   Title Pt to be Independent in a home exercise program to reduce pain to no greater than a 5/10 to alllow improved ROM    Time 3   Period Weeks   Status On-going   PT SHORT TERM GOAL #2   Title Pt to be demonstrate functional left cervical rotation to allow paient to be able to see the blind side when driving    Time 3   Period Weeks   Status On-going   PT SHORT TERM GOAL #3   Title Pt to be able to verbalize the importance of correct sitting posture in neck care    Time 3   Period Weeks   Status On-going           PT Long Term Goals - 01/05/16 1204    PT LONG TERM GOAL #1   Title Pt Lt ring and little finger numbness  to be gone  to allow pt to sleep without waking up.    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    Time 6   Period Weeks   Status On-going   PT LONG TERM GOAL #4   Title Lt UE shoulder flexion to be WNL in order for patient to be able to throw a softball overhead to grand daughter   Baseline currently with 156 degrees flexion   Time 6   Period Weeks   Status New               Plan - 01/13/16 [id number]    Clinical Impression Statement Pt with continued improvement with increased cervical and Lt UE functional ROM and no longer with pain.  Main  dysfunction continues to be numbness/tingling in Lt ring and pinky fingers.  Progressed  reps this session and added mechanical cervical traction to maintain constant, deeper stretch. Also added doorway stretch for Lt UE.  PT reported overall comfort and much decreased symtoms into Lt hand.  Pt overall pleased with progress.     Rehab Potential Good   PT Frequency 2x / week   PT Duration 6 weeks   PT Treatment/Interventions ADLs/Self Care Home Management;Electrical Stimulation;Moist Heat;Traction;Ultrasound;Therapeutic exercise;Patient/family education;Manual techniques;Passive range of motion   PT Next Visit Plan Progress strengthening exercises and ROM of Lt UE.  Assess effectiveness of adding mechanical traction next session.    PT Home Exercise Plan given for theraband   Consulted and Agree with Plan of Care Patient      Patient will benefit from skilled therapeutic intervention in order to improve the following deficits and impairments:  Decreased strength, Decreased range of motion, Postural dysfunction, Pain, Increased muscle spasms  Visit Diagnosis: Other symptoms and signs involving the musculoskeletal system  Other disturbances of skin sensation  Radiculopathy, cervical region     Problem List Patient Active Problem List   Diagnosis Date Noted  . Osteoarthritis of left hip 11/19/2015  . Status post total replacement of left hip 11/19/2015  . Avascular necrosis of bone of left hip ([place]) 10/06/2015  . Acquired inequality of length of lower legs 10/06/2015  . Colostomy in place [facility])   . Essential hypertension, benign 05/12/2014  . Tachycardia 04/14/2014  . Thrombocytosis ([place]) 04/14/2014  . Eustachian tube dysfunction 04/07/2014  . Obesity (BMI 30-39.9) 03/24/2014  . Rectal cancer, uT2uN0, s/p LAR/ileostomy 03/20/2014 colostomy [id number] 12/25/2013  . Hemorrhoids, internal 12/25/2013  . GERD (gastroesophageal reflux disease) 11/27/2013  . Atypical chest pain 11/10/2013  .  Tobacco abuse, in remission 11/10/2013    Teena Irani, PTA/CLT [phone]  01/13/2016, 9:51 AM  [place] [address] [place], [place], [address] Phone: [phone]   Fax:  [phone]  Name: [name] MRN: [id number] Date of Birth: Oct 02, 1955

## 2016-01-18 ENCOUNTER — Ambulatory Visit (HOSPITAL_COMMUNITY): Payer: BLUE CROSS/BLUE SHIELD | Admitting: Physical Therapy

## 2016-01-18 DIAGNOSIS — M5412 Radiculopathy, cervical region: Secondary | ICD-10-CM | POA: Diagnosis not present

## 2016-01-18 DIAGNOSIS — R29898 Other symptoms and signs involving the musculoskeletal system: Secondary | ICD-10-CM

## 2016-01-18 DIAGNOSIS — R208 Other disturbances of skin sensation: Secondary | ICD-10-CM

## 2016-01-18 NOTE — Therapy (Signed)
[place], [place], [address] Phone: [phone]   Fax:  [phone]  Physical Therapy Treatment  Patient Details  Name: [name] MRN: [id number] Date of Birth: Apr 12, 1956 Referring Provider: Jean Rosenthal  Encounter Date: 01/18/2016      PT End of Session - 01/18/16 0940    Visit Number 5   Number of Visits 12   Date for PT Re-Evaluation 02/03/16   Authorization Type BCBS   Authorization - Visit Number 5   Authorization - Number of Visits 10   PT Start Time 0905   PT Stop Time 0955   PT Time Calculation (min) 50 min   Activity Tolerance Patient tolerated treatment well   Behavior During Therapy [facility] for tasks assessed/performed      Past Medical History  Diagnosis Date  . GERD (gastroesophageal reflux disease)   . Arthritis   . Aspiration pneumonia ([place]) 03/24/2014  . Hypertension   . Difficulty urinating   . Anxiety   . Colostomy in place [facility])   . Ileostomy present ([place])   . Shortening, leg, congenital     LEFT  . Rectal cancer, uT2uN0, s/p LAR/ileostomy 03/20/2014 colostomy Aug2015 12/25/2013    TCS MAY 2015   . Skin irritation     "around ostomy site" -dry and reddened.    Past Surgical History  Procedure Laterality Date  . Colonoscopy N/A 12/22/2013    SLF: 1. Normal mucosa in the terminal ilium. 2. Moderate diverticulosis in the transverse colon, descending colon, and sigmoid colon. 3. Rectal bleeding due ot rectal mass and hemorrhoids 4. Moderate sized internal hemorrhoids.   . Esophagogastroduodenoscopy N/A 12/22/2013    SLF: Probable Candia esophagitis2. Probable Barretts esphagus 3. Mild gastritis.   . Eus N/A 01/01/2014    Procedure: LOWER ENDOSCOPIC ULTRASOUND (EUS);  Surgeon: Milus Banister, MD;  [facility];  Service: Endoscopy;  Laterality: N/A;  . Hip fracture surgery Left age 89 on 04-06-1970  . Eye reattachment and ear reattachment  Right 1971  . Colon  resection N/A 03/24/2014    Procedure: Diagnostic  laparotomy, takedown of coloanal anastamosis, end colostomy, with wound vac;  Surgeon: Adin Hector, MD;  [facility];  Service: General;  Laterality: N/A;  . Flexible sigmoidoscopy N/A 03/24/2014    Procedure: FLEXIBLE SIGMOIDOSCOPY;  Surgeon: Adin Hector, MD;  [facility];  Service: General;  Laterality: N/A;  . Ileostomy    . Colostomy    . Ileostomy closure N/A 08/25/2014    Procedure: TAKEDOWN OF LOOP ILEOSTOMY ;  Surgeon: Michael Boston, MD;  [facility];  Service: General;  Laterality: N/A;  . Examination under anesthesia  08/25/2014    Procedure: Jasmine December UNDER ANESTHESIA;  Surgeon: Michael Boston, MD;  [facility];  Service: General;;  . Colonoscopy N/A 01/19/2015    Procedure: COLONOSCOPY;  Surgeon: Danie Binder, MD;  [facility];  Service: Endoscopy;  Laterality: N/A;  1215  . Total hip arthroplasty Left 11/19/2015    Procedure: LEFT TOTAL HIP ARTHROPLASTY ANTERIOR APPROACH;  Surgeon: Mcarthur Rossetti, MD;  [facility];  Service: Orthopedics;  Laterality: Left;    There were no vitals filed for this visit.      Subjective Assessment - 01/18/16 0918    Subjective Pt states he can tell he is improving.  States his radiating symptoms are decreasing and it's no longer waking him at night.  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   Currently in Pain? No/denies                         [facility] Adult PT Treatment/Exercise - 01/18/16 0001    Neck Exercises: Machines for Strengthening   UBE (Upper Arm Bike) 6 minutes fwd/bkwd   Neck Exercises: Theraband   Scapula Retraction Green;15 reps   Shoulder Extension Green;15 reps   Rows Green;15 reps   Neck Exercises: Standing   Upper Extremity Flexion with Stabilization 15 reps   UE Flexion with Stabilization Limitations 2# dumbells   Other Standing Exercises lateral raise 2# weights 15 reps   Modalities    Modalities Traction;Moist Heat   Moist Heat Therapy   Number Minutes Moist Heat 15 Minutes   Moist Heat Location Cervical   Traction   Type of Traction Cervical   Min (lbs) 0   Max (lbs) 15   Hold Time static   Rest Time n/a   Time 12                  PT Short Term Goals - 01/05/16 1204    PT SHORT TERM GOAL #1   Title Pt to be Independent in a home exercise program to reduce pain to no greater than a 5/10 to alllow improved ROM    Time 3   Period Weeks   Status On-going   PT SHORT TERM GOAL #2   Title Pt to be demonstrate functional left cervical rotation to allow paient to be able to see the blind side when driving    Time 3   Period Weeks   Status On-going   PT SHORT TERM GOAL #3   Title Pt to be able to verbalize the importance of correct sitting posture in neck care    Time 3   Period Weeks   Status On-going           PT Long Term Goals - 01/05/16 1204    PT LONG TERM GOAL #1   Title Pt Lt ring and little finger numbness  to be gone  to allow pt to sleep without waking up.    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    Time 6   Period Weeks   Status On-going   PT LONG TERM GOAL #4   Title Lt UE shoulder flexion to be WNL in order for patient to be able to throw a softball overhead to grand daughter   Baseline currently with 156 degrees flexion   Time 6   Period Weeks   Status New               Plan - 01/18/16 0941    Clinical Impression Statement Decreasing symptoms overall with noted improvement in standing posturing.  Encouraged patient to continue SLS and hip exercises to  further improve in his gait to decrease antalgia and return to gait without AD.  Continued with  cervical traction to decrease compression and further improve cervical symtoms.  Increased to 15# of static pressure with good results and comfort.   No new exercises added this session.     Rehab Potential Good   PT Frequency 2x / week   PT Duration 6 weeks   PT Treatment/Interventions ADLs/Self Care Home Management;Electrical Stimulation;Moist Heat;Traction;Ultrasound;Therapeutic exercise;Patient/family education;Manual techniques;Passive range of motion   PT Next Visit Plan Progress strengthening exercises and ROM of Lt UE.     Consulted and Agree with Plan of Care Patient      Patient will benefit from skilled therapeutic intervention in order to improve the following deficits and impairments:  Decreased strength, Decreased range of motion, Postural dysfunction, Pain, Increased muscle spasms  Visit Diagnosis: Other symptoms and signs involving the musculoskeletal system  Other disturbances of skin sensation  Radiculopathy, cervical region     Problem List Patient Active Problem List   Diagnosis Date Noted  . Osteoarthritis of left hip 11/19/2015  . Status post total replacement of left hip 11/19/2015  . Avascular necrosis of bone of left hip ([place]) 10/06/2015  . Acquired inequality of length of lower legs 10/06/2015  . Colostomy in place [facility])   . Essential hypertension, benign 05/12/2014  . Tachycardia 04/14/2014  . Thrombocytosis ([place]) 04/14/2014  . Eustachian tube dysfunction 04/07/2014  . Obesity (BMI 30-39.9) 03/24/2014  . Rectal cancer, uT2uN0, s/p LAR/ileostomy 03/20/2014 colostomy [id number] 12/25/2013  . Hemorrhoids, internal 12/25/2013  . GERD (gastroesophageal reflux disease) 11/27/2013  . Atypical chest pain 11/10/2013  . Tobacco abuse, in remission 11/10/2013    Teena Irani, PTA/CLT [phone]  01/18/2016, 9:46 AM  [place] [address] [place], [place], [address] Phone: [phone]   Fax:  [phone]  Name: [name] MRN: [id number] Date of Birth: May 28, 1956

## 2016-01-21 ENCOUNTER — Ambulatory Visit (HOSPITAL_COMMUNITY): Payer: BLUE CROSS/BLUE SHIELD | Attending: Orthopaedic Surgery

## 2016-01-21 DIAGNOSIS — R29898 Other symptoms and signs involving the musculoskeletal system: Secondary | ICD-10-CM | POA: Diagnosis not present

## 2016-01-21 DIAGNOSIS — M5412 Radiculopathy, cervical region: Secondary | ICD-10-CM | POA: Diagnosis present

## 2016-01-21 DIAGNOSIS — R208 Other disturbances of skin sensation: Secondary | ICD-10-CM | POA: Diagnosis present

## 2016-01-21 NOTE — Therapy (Signed)
[place], [place], [address] Phone: [phone]   Fax:  [phone]  Physical Therapy Treatment  Patient Details  Name: [name] MRN: [id number] Date of Birth: April 02, 1956 Referring Provider: Jean Rosenthal  Encounter Date: 01/21/2016      PT End of Session - 01/21/16 1204    Visit Number 6   Number of Visits 12   Date for PT Re-Evaluation 02/03/16   Authorization Type BCBS   Authorization - Visit Number 6   Authorization - Number of Visits 10   PT Start Time [phone]   PT Stop Time 0901   PT Time Calculation (min) 44 min   Activity Tolerance Patient tolerated treatment well   Behavior During Therapy [facility] for tasks assessed/performed      Past Medical History  Diagnosis Date  . GERD (gastroesophageal reflux disease)   . Arthritis   . Aspiration pneumonia ([place]) 03/24/2014  . Hypertension   . Difficulty urinating   . Anxiety   . Colostomy in place [facility])   . Ileostomy present ([place])   . Shortening, leg, congenital     LEFT  . Rectal cancer, uT2uN0, s/p LAR/ileostomy 03/20/2014 colostomy Aug2015 12/25/2013    TCS MAY 2015   . Skin irritation     "around ostomy site" -dry and reddened.    Past Surgical History  Procedure Laterality Date  . Colonoscopy N/A 12/22/2013    SLF: 1. Normal mucosa in the terminal ilium. 2. Moderate diverticulosis in the transverse colon, descending colon, and sigmoid colon. 3. Rectal bleeding due ot rectal mass and hemorrhoids 4. Moderate sized internal hemorrhoids.   . Esophagogastroduodenoscopy N/A 12/22/2013    SLF: Probable Candia esophagitis2. Probable Barretts esphagus 3. Mild gastritis.   . Eus N/A 01/01/2014    Procedure: LOWER ENDOSCOPIC ULTRASOUND (EUS);  Surgeon: Milus Banister, MD;  Location: [facility];  Service: Endoscopy;  Laterality: N/A;  . Hip fracture surgery Left age 60 on 04-06-1970  . Eye reattachment and ear reattachment  Right 1971  . Colon  resection N/A 03/24/2014    Procedure: Diagnostic  laparotomy, takedown of coloanal anastamosis, end colostomy, with wound vac;  Surgeon: Adin Hector, MD;  [facility];  Service: General;  Laterality: N/A;  . Flexible sigmoidoscopy N/A 03/24/2014    Procedure: FLEXIBLE SIGMOIDOSCOPY;  Surgeon: Adin Hector, MD;  [facility];  Service: General;  Laterality: N/A;  . Ileostomy    . Colostomy    . Ileostomy closure N/A 08/25/2014    Procedure: TAKEDOWN OF LOOP ILEOSTOMY ;  Surgeon: Michael Boston, MD;  [facility];  Service: General;  Laterality: N/A;  . Examination under anesthesia  08/25/2014    Procedure: Jasmine December UNDER ANESTHESIA;  Surgeon: Michael Boston, MD;  [facility];  Service: General;;  . Colonoscopy N/A 01/19/2015    Procedure: COLONOSCOPY;  Surgeon: Danie Binder, MD;  [facility];  Service: Endoscopy;  Laterality: N/A;  1215  . Total hip arthroplasty Left 11/19/2015    Procedure: LEFT TOTAL HIP ARTHROPLASTY ANTERIOR APPROACH;  Surgeon: Mcarthur Rossetti, MD;  [facility];  Service: Orthopedics;  Laterality: Left;    There were no vitals filed for this visit.      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 He said his shoulder and  neck felt pretty good time. HEP is going well.    Pertinent History Lt THR 11/20/2015 , Rectal cancer with colostomy in place (2015).    Currently in Pain? No/denies                         [facility] Adult PT Treatment/Exercise - 01/21/16 0001    Neck Exercises: Theraband   Shoulder Extension Blue;10 reps  2x10, bolt @ 2nd slot from top   Rows 10 reps;Blue  2x10, bolt @ T5 level   Horizontal ABduction 10 reps;Green  2x10, bolt @ T5 level   Neck Exercises: Supine   Other Supine Exercise LUE ulnar nerve gliding x20 PROM  LUE Ulnar nerve gliding x20 c cervical sidebend  AROM    Other Supine Exercise AROM ulnar nerve glide with cervical AROM  20x in sitting for HEP education.    Manual Therapy   Joint Mobilization C6 Left lateral glides supine and neutral 3x45s.    Passive ROM L cervical rotation, repeated movement.   x20                  PT Short Term Goals - 01/05/16 1204    PT SHORT TERM GOAL #1   Title Pt to be Independent in a home exercise program to reduce pain to no greater than a 5/10 to alllow improved ROM    Time 3   Period Weeks   Status On-going   PT SHORT TERM GOAL #2   Title Pt to be demonstrate functional left cervical rotation to allow paient to be able to see the blind side when driving    Time 3   Period Weeks   Status On-going   PT SHORT TERM GOAL #3   Title Pt to be able to verbalize the importance of correct sitting posture in neck care    Time 3   Period Weeks   Status On-going           PT Long Term Goals - 01/05/16 1204    PT LONG TERM GOAL #1   Title Pt Lt ring and little finger numbness  to be gone  to allow pt to sleep without waking up.    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    Time 6   Period Weeks   Status On-going   PT LONG TERM GOAL #4   Title Lt UE shoulder flexion to be WNL in order for patient to be able to throw a softball overhead to grand daughter   Baseline currently with 156 degrees flexion   Time 6   Period Weeks   Status New               Plan - 01/21/16 1204    Clinical Impression Statement Pt making progress toward goals today. Pt able to progress all exercises to higher reps and/or resistance. Pt  responding well to ulnar nerve gliding, with 50-75% reduction in numbness in Left  hand digits 4 and 5. Pt also reports some relief from cervical spine joint mobilizations . HEP is updated for pt to include some repeated L cervical flexion AROM and nerve glides adn to report back on overall progress.    Rehab Potential Good   PT Frequency 2x / week   PT Duration 6 weeks   PT Treatment/Interventions ADLs/Self Care Home Management;Electrical Stimulation;Moist Heat;Traction;Ultrasound;Therapeutic exercise;Patient/family education;Manual techniques;Passive range of motion   PT Next Visit Plan Review ulnar glides and get feedback, sustained LUE arm pull at 90degrees abduction and 10-20 degrees abduction.    PT Home Exercise Plan nerve glides, repeated cervical rotation   Consulted and Agree with Plan of Care Patient      Patient will benefit from skilled therapeutic intervention in order to improve the following deficits and impairments:  Decreased strength, Decreased range of motion, Postural dysfunction, Pain, Increased muscle spasms  Visit Diagnosis: Other symptoms and signs involving the musculoskeletal system  Other disturbances of skin sensation  Radiculopathy, cervical region     Problem List Patient Active Problem List   Diagnosis Date Noted  . Osteoarthritis of left hip 11/19/2015  . Status post total replacement of left hip 11/19/2015  . Avascular necrosis of bone of left hip ([place]) 10/06/2015  . Acquired inequality of length of lower legs 10/06/2015  . Colostomy in place [facility])   . Essential hypertension, benign 05/12/2014  . Tachycardia 04/14/2014  . Thrombocytosis ([place]) 04/14/2014  . Eustachian tube dysfunction 04/07/2014  . Obesity (BMI 30-39.9) 03/24/2014  . Rectal cancer, uT2uN0, s/p LAR/ileostomy 03/20/2014 colostomy [id number] 12/25/2013  . Hemorrhoids, internal 12/25/2013  . GERD (gastroesophageal reflux disease) 11/27/2013  . Atypical chest pain 11/10/2013  . Tobacco  abuse, in remission 11/10/2013   12:09 PM, 01/21/2016 Etta Grandchild, PT, DPT PRN Physical Therapist at [facility] # [id number] [phone] (office)     [place], [place], [address] Phone: [phone]   Fax:  [phone]  Name: YASHAS DUN MRN: [id number] Date of Birth: 03/24/1956

## 2016-01-25 ENCOUNTER — Ambulatory Visit (HOSPITAL_COMMUNITY): Payer: BLUE CROSS/BLUE SHIELD | Admitting: Physical Therapy

## 2016-01-25 DIAGNOSIS — M5412 Radiculopathy, cervical region: Secondary | ICD-10-CM

## 2016-01-25 DIAGNOSIS — R29898 Other symptoms and signs involving the musculoskeletal system: Secondary | ICD-10-CM | POA: Diagnosis not present

## 2016-01-25 DIAGNOSIS — R208 Other disturbances of skin sensation: Secondary | ICD-10-CM

## 2016-01-25 NOTE — Therapy (Signed)
[place], [place], [address] Phone: [phone]   Fax:  [phone]  Physical Therapy Treatment  Patient Details  Name: [name] MRN: [id number] Date of Birth: August 01, 1956 Referring Provider: Jean Rosenthal  Encounter Date: 01/25/2016      PT End of Session - 01/25/16 0859    Visit Number 7   Number of Visits 12   Date for PT Re-Evaluation 02/03/16   Authorization Type BCBS   Authorization - Visit Number 7   Authorization - Number of Visits 10   PT Start Time [phone]   PT Stop Time 0902   PT Time Calculation (min) 46 min   Activity Tolerance Patient tolerated treatment well      Past Medical History  Diagnosis Date  . GERD (gastroesophageal reflux disease)   . Arthritis   . Aspiration pneumonia ([place]) 03/24/2014  . Hypertension   . Difficulty urinating   . Anxiety   . Colostomy in place [facility])   . Ileostomy present ([place])   . Shortening, leg, congenital     LEFT  . Rectal cancer, uT2uN0, s/p LAR/ileostomy 03/20/2014 colostomy Aug2015 12/25/2013    TCS MAY 2015   . Skin irritation     "around ostomy site" -dry and reddened.    Past Surgical History  Procedure Laterality Date  . Colonoscopy N/A 12/22/2013    SLF: 1. Normal mucosa in the terminal ilium. 2. Moderate diverticulosis in the transverse colon, descending colon, and sigmoid colon. 3. Rectal bleeding due ot rectal mass and hemorrhoids 4. Moderate sized internal hemorrhoids.   . Esophagogastroduodenoscopy N/A 12/22/2013    SLF: Probable Candia esophagitis2. Probable Barretts esphagus 3. Mild gastritis.   . Eus N/A 01/01/2014    Procedure: LOWER ENDOSCOPIC ULTRASOUND (EUS);  Surgeon: Milus Banister, MD;  Location: [facility];  Service: Endoscopy;  Laterality: N/A;  . Hip fracture surgery Left age 46 on 04-06-1970  . Eye reattachment and ear reattachment  Right 1971  . Colon resection N/A 03/24/2014    Procedure: Diagnostic  laparotomy,  takedown of coloanal anastamosis, end colostomy, with wound vac;  Surgeon: Adin Hector, MD;  [facility];  Service: General;  Laterality: N/A;  . Flexible sigmoidoscopy N/A 03/24/2014    Procedure: FLEXIBLE SIGMOIDOSCOPY;  Surgeon: Adin Hector, MD;  [facility];  Service: General;  Laterality: N/A;  . Ileostomy    . Colostomy    . Ileostomy closure N/A 08/25/2014    Procedure: TAKEDOWN OF LOOP ILEOSTOMY ;  Surgeon: Michael Boston, MD;  [facility];  Service: General;  Laterality: N/A;  . Examination under anesthesia  08/25/2014    Procedure: Jasmine December UNDER ANESTHESIA;  Surgeon: Michael Boston, MD;  [facility];  Service: General;;  . Colonoscopy N/A 01/19/2015    Procedure: COLONOSCOPY;  Surgeon: Danie Binder, MD;  [facility];  Service: Endoscopy;  Laterality: N/A;  1215  . Total hip arthroplasty Left 11/19/2015    Procedure: LEFT TOTAL HIP ARTHROPLASTY ANTERIOR APPROACH;  Surgeon: Mcarthur Rossetti, MD;  [facility];  Service: Orthopedics;  Laterality: Left;    There were no vitals filed for this visit.      Subjective Assessment - 01/25/16 0831    Subjective Pt states he is not waking up at night any longer.  He has no pain in his arm only tingling in his fingers now.      Currently in Pain? No/denies  [facility] Adult PT Treatment/Exercise - 01/25/16 0001    Exercises   Exercises Neck   Neck Exercises: Standing   Neck Retraction 10 reps   Upper Extremity Flexion with Stabilization 15 reps   Other Standing Exercises UE extension/ IR/ ER with stabilization x 15.    Neck Exercises: Seated   Other Seated Exercise 3- D cervical and thoracic excusion x 5    Neck Exercises: Supine   Other Supine Exercise PROM of Lt UE                 PT Education - 01/25/16 0858    Education provided Yes   Education Details for thoracic excursion   Person(s) Educated Patient   Methods Explanation   Comprehension  Verbalized understanding          PT Short Term Goals - 01/05/16 1204    PT SHORT TERM GOAL #1   Title Pt to be Independent in a home exercise program to reduce pain to no greater than a 5/10 to alllow improved ROM    Time 3   Period Weeks   Status On-going   PT SHORT TERM GOAL #2   Title Pt to be demonstrate functional left cervical rotation to allow paient to be able to see the blind side when driving    Time 3   Period Weeks   Status On-going   PT SHORT TERM GOAL #3   Title Pt to be able to verbalize the importance of correct sitting posture in neck care    Time 3   Period Weeks   Status On-going           PT Long Term Goals - 01/05/16 1204    PT LONG TERM GOAL #1   Title Pt Lt ring and little finger numbness  to be gone  to allow pt to sleep without waking up.    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    Time 6   Period Weeks   Status On-going   PT LONG TERM GOAL #4   Title Lt UE shoulder flexion to be WNL in order for patient to be able to throw a softball overhead to grand daughter   Baseline currently with 156 degrees flexion   Time 6   Period Weeks   Status New               Plan - 01/25/16 0859    Clinical Impression Statement Treatment focused on stabiility of cervical area wtth UE motion as well as incrasing thoracic and shoulder ROM.  Pt continues to imiprove both subjective and objectively.    PT Frequency 2x / week   PT Duration 6 weeks   PT Next Visit Plan Continue with PROM of Lt shoulder    Consulted and Agree with Plan of Care Patient      Patient will benefit from  skilled therapeutic intervention in order to improve the following deficits and impairments:  Decreased strength, Decreased range of  motion, Postural dysfunction, Pain, Increased muscle spasms  Visit Diagnosis: Other symptoms and signs involving the musculoskeletal system  Other disturbances of skin sensation  Radiculopathy, cervical region     Problem List Patient Active Problem List   Diagnosis Date Noted  . Osteoarthritis of left hip 11/19/2015  . Status post total replacement of left hip 11/19/2015  . Avascular necrosis of bone of left hip ([place]) 10/06/2015  . Acquired inequality of length of lower legs 10/06/2015  . Colostomy in place [facility])   . Essential hypertension, benign 05/12/2014  . Tachycardia 04/14/2014  . Thrombocytosis ([place]) 04/14/2014  . Eustachian tube dysfunction 04/07/2014  . Obesity (BMI 30-39.9) 03/24/2014  . Rectal cancer, uT2uN0, s/p LAR/ileostomy 03/20/2014 colostomy [id number] 12/25/2013  . Hemorrhoids, internal 12/25/2013  . GERD (gastroesophageal reflux disease) 11/27/2013  . Atypical chest pain 11/10/2013  . Tobacco abuse, in remission 11/10/2013    Rayetta Humphrey, PT CLT [phone] 01/25/2016, 9:01 AM  [place], [place], [address] Phone: [phone]   Fax:  [phone]  Name: [name] MRN: [id number] Date of Birth: September 20, 1955

## 2016-01-27 ENCOUNTER — Ambulatory Visit (HOSPITAL_COMMUNITY): Payer: BLUE CROSS/BLUE SHIELD | Admitting: Physical Therapy

## 2016-01-27 DIAGNOSIS — M5412 Radiculopathy, cervical region: Secondary | ICD-10-CM

## 2016-01-27 DIAGNOSIS — R29898 Other symptoms and signs involving the musculoskeletal system: Secondary | ICD-10-CM

## 2016-01-27 DIAGNOSIS — R208 Other disturbances of skin sensation: Secondary | ICD-10-CM

## 2016-01-27 NOTE — Therapy (Signed)
[place], [place], [address] Phone: [phone]   Fax:  [phone]  Physical Therapy Treatment  Patient Details  Name: [name] MRN: [id number] Date of Birth: 1955/12/11 Referring Provider: Jean Rosenthal  Encounter Date: 01/27/2016      PT End of Session - 01/27/16 0836    Visit Number 8   Number of Visits 12   Date for PT Re-Evaluation 02/03/16   Authorization Type BCBS   Authorization - Visit Number 8   Authorization - Number of Visits 10   PT Start Time 0815   PT Stop Time [id number]   PT Time Calculation (min) 43 min   Activity Tolerance Patient tolerated treatment well      Past Medical History  Diagnosis Date  . GERD (gastroesophageal reflux disease)   . Arthritis   . Aspiration pneumonia ([place]) 03/24/2014  . Hypertension   . Difficulty urinating   . Anxiety   . Colostomy in place [facility])   . Ileostomy present ([place])   . Shortening, leg, congenital     LEFT  . Rectal cancer, uT2uN0, s/p LAR/ileostomy 03/20/2014 colostomy Aug2015 12/25/2013    TCS MAY 2015   . Skin irritation     "around ostomy site" -dry and reddened.    Past Surgical History  Procedure Laterality Date  . Colonoscopy N/A 12/22/2013    SLF: 1. Normal mucosa in the terminal ilium. 2. Moderate diverticulosis in the transverse colon, descending colon, and sigmoid colon. 3. Rectal bleeding due ot rectal mass and hemorrhoids 4. Moderate sized internal hemorrhoids.   . Esophagogastroduodenoscopy N/A 12/22/2013    SLF: Probable Candia esophagitis2. Probable Barretts esphagus 3. Mild gastritis.   . Eus N/A 01/01/2014    Procedure: LOWER ENDOSCOPIC ULTRASOUND (EUS);  Surgeon: Milus Banister, MD;  [facility];  Service: Endoscopy;  Laterality: N/A;  . Hip fracture surgery Left age 31 on 04-06-1970  . Eye reattachment and ear reattachment  Right 1971  . Colon resection N/A 03/24/2014    Procedure: Diagnostic  laparotomy,  takedown of coloanal anastamosis, end colostomy, with wound vac;  Surgeon: Adin Hector, MD;  [facility];  Service: General;  Laterality: N/A;  . Flexible sigmoidoscopy N/A 03/24/2014    Procedure: FLEXIBLE SIGMOIDOSCOPY;  Surgeon: Adin Hector, MD;  [facility];  Service: General;  Laterality: N/A;  . Ileostomy    . Colostomy    . Ileostomy closure N/A 08/25/2014    Procedure: TAKEDOWN OF LOOP ILEOSTOMY ;  Surgeon: Michael Boston, MD;  [facility];  Service: General;  Laterality: N/A;  . Examination under anesthesia  08/25/2014    Procedure: Jasmine December UNDER ANESTHESIA;  Surgeon: Michael Boston, MD;  [facility];  Service: General;;  . Colonoscopy N/A 01/19/2015    Procedure: COLONOSCOPY;  Surgeon: Danie Binder, MD;  [facility];  Service: Endoscopy;  Laterality: N/A;  1215  . Total hip arthroplasty Left 11/19/2015    Procedure: LEFT TOTAL HIP ARTHROPLASTY ANTERIOR APPROACH;  Surgeon: Mcarthur Rossetti, MD;  [facility];  Service: Orthopedics;  Laterality: Left;    There were no vitals filed for this visit.      Subjective Assessment - 01/27/16 0820    Subjective [name] states that he would like [place] to concentrate on his Lt shoulder more than anything else.  States that his Lt arm feels tight but no pain.    Pertinent History Lt THR 11/20/2015 ,  Rectal cancer with colostomy in place (2015).    Currently in Pain? No/denies               [place] Adult PT Treatment/Exercise - 01/27/16 0001    Exercises   Exercises Shoulder   Neck Exercises: Supine   Other Supine Exercise PROM of Lt UE    Shoulder Exercises: Supine   Other Supine Exercises wand flexion / abducton with 2# dowel x 10    Other Supine Exercises ER/IR with 2# x 10; scapular protraction/ retraction x 10     Shoulder Exercises: Seated   Other Seated Exercises Pulley exercises for Lt shoulder flextion and abduction     Posterior capsule stretch 30" x 3       PT Education -  01/27/16 0833    Education provided Yes   Education Details for wand exercises to increase shoulder ROM    Person(s) Educated Patient   Methods Explanation;Handout;Tactile cues;Verbal cues   Comprehension Verbalized understanding;Returned demonstration          PT Short Term Goals - 01/05/16 1204    PT SHORT TERM GOAL #1   Title Pt to be Independent in a home exercise program to reduce pain to no greater than a 5/10 to alllow improved ROM    Time 3   Period Weeks   Status On-going   PT SHORT TERM GOAL #2   Title Pt to be demonstrate functional left cervical rotation to allow paient to be able to see the blind side when driving    Time 3   Period Weeks   Status On-going   PT SHORT TERM GOAL #3   Title Pt to be able to verbalize the importance of correct sitting posture in neck care    Time 3   Period Weeks   Status On-going           PT Long Term Goals - 01/05/16 1204    PT LONG TERM GOAL #1   Title Pt Lt ring and little finger numbness  to be gone  to allow pt to sleep without waking up.    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    Time 6   Period Weeks   Status On-going   PT LONG TERM GOAL #4   Title Lt UE shoulder flexion to be WNL in order for patient to be able to throw a softball overhead to grand daughter   Baseline currently with 156 degrees flexion   Time 6   Period Weeks   Status New               Plan - 01/27/16 [id number]    Clinical Impression Statement Treatment focused on improving LT shoulder ROM as the limitation pt has is affecting his ability to throw a ball with his grandson and  to calf rope.  Pt completing all exercises given to him at home with good form.  Pt will benefit from continued skilled therapy to work on both  mobility and stability of  cervical, thoracic and Lt shoulder.    Rehab Potential Good   PT Frequency 2x / week   PT Duration 6 weeks   PT Treatment/Interventions ADLs/Self Care Home Management;Electrical Stimulation;Moist Heat;Traction;Ultrasound;Therapeutic exercise;Patient/family education;Manual techniques;Passive range of motion   PT Next Visit Plan Begin ball on wall stabilization for Lt shoulder and  wall pushup for cervcial stability.       Patient will benefit from skilled therapeutic intervention in order to improve the following deficits and impairments:  Decreased strength, Decreased range of motion, Postural dysfunction, Pain, Increased muscle spasms  Visit Diagnosis: Other symptoms and signs involving the musculoskeletal system  Other disturbances of skin sensation  Radiculopathy, cervical region     Problem List Patient Active Problem List   Diagnosis Date Noted  . Osteoarthritis of left hip 11/19/2015  . Status post total replacement of left hip 11/19/2015  . Avascular necrosis of bone of left hip ([place]) 10/06/2015  . Acquired inequality of length of lower legs 10/06/2015  . Colostomy in place [facility])   . Essential hypertension, benign 05/12/2014  . Tachycardia 04/14/2014  . Thrombocytosis ([place]) 04/14/2014  . Eustachian tube dysfunction 04/07/2014  . Obesity (BMI 30-39.9) 03/24/2014  . Rectal cancer, uT2uN0, s/p LAR/ileostomy 03/20/2014 colostomy [id number] 12/25/2013  . Hemorrhoids, internal 12/25/2013  . GERD (gastroesophageal reflux disease) 11/27/2013  . Atypical chest pain 11/10/2013  . Tobacco abuse, in remission 11/10/2013    Rayetta Humphrey, PT CLT [phone] 01/27/2016, 8:58 AM  [place], [place], [address] Phone: [phone]   Fax:   [phone]  Name: [name] MRN: [id number] Date of Birth: 01-14-1956

## 2016-01-27 NOTE — Patient Instructions (Signed)
ROM: Flexion - Wand (Supine)    Lie on back holding wand. Raise arms over head.  Repeat 10__ times per set. Do ___1_ sets per session. Do _1-2___ sessions per day.  http://orth.exer.us/928   Copyright  VHI. All rights reserved.  ROM: Abduction - Wand    Holding wand with left hand palm up, push wand directly out to side, leading with other hand palm down, until stretch is felt. Hold __10__ seconds. Repeat __10__ times per set. Do _1___ sets per session. Do __1-2__ sessions per day.  http://orth.exer.us/746   Copyright  VHI. All rights reserved.  ROM: External / Internal Rotation - Wand    Holding wand with left hand palm up, push out from body with other hand, palm down. Keep both elbows bent. When stretch is felt, hold _10___ seconds. Repeat to other side, leading with same hand. Keep elbows bent. Repeat __10__ times per set. Do ___1_ sets per session. Do _1-2___ sessions per day.  http://orth.exer.us/748   Copyright  VHI. All rights reserved.  Push can up to the ceiling then pull back to chest squeezing your shoulder blades together.  Do 10   Times 3 times a day

## 2016-02-01 ENCOUNTER — Ambulatory Visit (HOSPITAL_COMMUNITY): Payer: BLUE CROSS/BLUE SHIELD | Admitting: Physical Therapy

## 2016-02-01 DIAGNOSIS — R29898 Other symptoms and signs involving the musculoskeletal system: Secondary | ICD-10-CM | POA: Diagnosis not present

## 2016-02-01 DIAGNOSIS — M5412 Radiculopathy, cervical region: Secondary | ICD-10-CM

## 2016-02-01 DIAGNOSIS — R208 Other disturbances of skin sensation: Secondary | ICD-10-CM

## 2016-02-01 NOTE — Therapy (Signed)
[place], [place], [address] Phone: [phone]   Fax:  [phone]  Physical Therapy Treatment  Patient Details  Name: [name] MRN: [id number] Date of Birth: 18-Apr-1956 Referring Provider: Jean Rosenthal  Encounter Date: 02/01/2016      PT End of Session - 02/01/16 1249    Visit Number 9   Number of Visits 12   Date for PT Re-Evaluation 02/03/16   Authorization Type [employer]   Authorization - Visit Number 9   Authorization - Number of Visits 10   PT Start Time 0815   PT Stop Time 0900   PT Time Calculation (min) 45 min   Activity Tolerance Patient tolerated treatment well   Behavior During Therapy [facility] for tasks assessed/performed      Past Medical History  Diagnosis Date  . GERD (gastroesophageal reflux disease)   . Arthritis   . Aspiration pneumonia ([place]) 03/24/2014  . Hypertension   . Difficulty urinating   . Anxiety   . Colostomy in place [facility])   . Ileostomy present ([place])   . Shortening, leg, congenital     LEFT  . Rectal cancer, uT2uN0, s/p LAR/ileostomy 03/20/2014 colostomy Aug2015 12/25/2013    TCS MAY 2015   . Skin irritation     "around ostomy site" -dry and reddened.    Past Surgical History  Procedure Laterality Date  . Colonoscopy N/A 12/22/2013    SLF: 1. Normal mucosa in the terminal ilium. 2. Moderate diverticulosis in the transverse colon, descending colon, and sigmoid colon. 3. Rectal bleeding due ot rectal mass and hemorrhoids 4. Moderate sized internal hemorrhoids.   . Esophagogastroduodenoscopy N/A 12/22/2013    SLF: Probable Candia esophagitis2. Probable Barretts esphagus 3. Mild gastritis.   . Eus N/A 01/01/2014    Procedure: LOWER ENDOSCOPIC ULTRASOUND (EUS);  Surgeon: Milus Banister, MD;  [facility];  Service: Endoscopy;  Laterality: N/A;  . Hip fracture surgery Left age 60 on 04-06-1970  . Eye reattachment and ear reattachment  Right 1971  . Colon  resection N/A 03/24/2014    Procedure: Diagnostic  laparotomy, takedown of coloanal anastamosis, end colostomy, with wound vac;  Surgeon: Adin Hector, MD;  [facility];  Service: General;  Laterality: N/A;  . Flexible sigmoidoscopy N/A 03/24/2014    Procedure: FLEXIBLE SIGMOIDOSCOPY;  Surgeon: Adin Hector, MD;  [facility];  Service: General;  Laterality: N/A;  . Ileostomy    . Colostomy    . Ileostomy closure N/A 08/25/2014    Procedure: TAKEDOWN OF LOOP ILEOSTOMY ;  Surgeon: Michael Boston, MD;  [facility];  Service: General;  Laterality: N/A;  . Examination under anesthesia  08/25/2014    Procedure: Jasmine December UNDER ANESTHESIA;  Surgeon: Michael Boston, MD;  [facility];  Service: General;;  . Colonoscopy N/A 01/19/2015    Procedure: COLONOSCOPY;  Surgeon: Danie Binder, MD;  [facility];  Service: Endoscopy;  Laterality: N/A;  1215  . Total hip arthroplasty Left 11/19/2015    Procedure: LEFT TOTAL HIP ARTHROPLASTY ANTERIOR APPROACH;  Surgeon: Mcarthur Rossetti, MD;  [facility];  Service: Orthopedics;  Laterality: Left;    There were no vitals filed for this visit.      Subjective Assessment - 02/01/16 0831    Subjective Pt states he is a little sore from bailing hay all weekend but having no pain.  Feels his ROM is improving.   Currently in Pain? Yes  Pain Score 0-No pain                         [facility] Adult PT Treatment/Exercise - 02/01/16 0001    Neck Exercises: Machines for Strengthening   UBE (Upper Arm Bike) 6 minutes fwd/bkwd   Neck Exercises: Theraband   Scapula Retraction Green;15 reps   Shoulder Extension Green;15 reps   Rows Green;15 reps   Neck Exercises: Standing   Wall Push Ups 10 reps   Shoulder Exercises: Supine   Other Supine Exercises PROM for Lt shoulder   Shoulder Exercises: Therapy Ball   Other Therapy Ball Exercises standing small circles CW/CCW and up/down 30" each direction                   PT Short Term Goals - 01/05/16 1204    PT SHORT TERM GOAL #1   Title Pt to be Independent in a home exercise program to reduce pain to no greater than a 5/10 to alllow improved ROM    Time 3   Period Weeks   Status On-going   PT SHORT TERM GOAL #2   Title Pt to be demonstrate functional left cervical rotation to allow paient to be able to see the blind side when driving    Time 3   Period Weeks   Status On-going   PT SHORT TERM GOAL #3   Title Pt to be able to verbalize the importance of correct sitting posture in neck care    Time 3   Period Weeks   Status On-going           PT Long Term Goals - 01/05/16 1204    PT LONG TERM GOAL #1   Title Pt Lt ring and little finger numbness  to be gone  to allow pt to sleep without waking up.    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    Time 6   Period Weeks   Status On-going   PT LONG TERM GOAL #4   Title Lt UE shoulder flexion to be WNL in order for patient to be able to throw a softball overhead to grand daughter   Baseline currently with 156 degrees flexion   Time 6   Period Weeks   Status New               Plan - 02/01/16 1250    Clinical Impression Statement Continued focus on improving LT shoulder strength and ROM.  Pt with noted improvment since beginning therapy.  Able to complete further PROM today, however continued limtations in all directions.  Added ball stab exercise and wall push ups without pain or diffiuclty this session .    Rehab Potential Good   PT Frequency 2x / week   PT Duration 6 weeks   PT Treatment/Interventions ADLs/Self Care Home  Management;Electrical Stimulation;Moist Heat;Traction;Ultrasound;Therapeutic exercise;Patient/family education;Manual techniques;Passive range of motion   PT Next Visit Plan Progress to rebounder throwing into  trampoline.  continue A/PROM for Lt shoulder.  Reassess.       Patient will benefit from skilled therapeutic intervention in order to improve the following deficits and impairments:  Decreased strength, Decreased range of motion, Postural dysfunction, Pain, Increased muscle spasms  Visit Diagnosis: Other symptoms and signs involving the musculoskeletal system  Other disturbances of skin sensation  Radiculopathy, cervical region     Problem List Patient Active Problem List   Diagnosis Date Noted  . Osteoarthritis of left hip 11/19/2015  . Status post total replacement of left hip 11/19/2015  . Avascular necrosis of bone of left hip ([place]) 10/06/2015  . Acquired inequality of length of lower legs 10/06/2015  . Colostomy in place [facility])   . Essential hypertension, benign 05/12/2014  . Tachycardia 04/14/2014  . Thrombocytosis ([place]) 04/14/2014  . Eustachian tube dysfunction 04/07/2014  . Obesity (BMI 30-39.9) 03/24/2014  . Rectal cancer, uT2uN0, s/p LAR/ileostomy 03/20/2014 colostomy [id number] 12/25/2013  . Hemorrhoids, internal 12/25/2013  . GERD (gastroesophageal reflux disease) 11/27/2013  . Atypical chest pain 11/10/2013  . Tobacco abuse, in remission 11/10/2013    Teena Irani, PTA/CLT [phone]  02/01/2016, 12:52 PM  [place] [address] [place], [place], [address] Phone: [phone]   Fax:  [phone]  Name: SHONDALE SLAVINSKI MRN: [id number] Date of Birth: 1956-06-10

## 2016-02-03 ENCOUNTER — Ambulatory Visit (HOSPITAL_COMMUNITY): Payer: BLUE CROSS/BLUE SHIELD | Admitting: Physical Therapy

## 2016-02-03 DIAGNOSIS — R29898 Other symptoms and signs involving the musculoskeletal system: Secondary | ICD-10-CM

## 2016-02-03 DIAGNOSIS — R208 Other disturbances of skin sensation: Secondary | ICD-10-CM

## 2016-02-03 DIAGNOSIS — M5412 Radiculopathy, cervical region: Secondary | ICD-10-CM

## 2016-02-03 NOTE — Therapy (Signed)
[place], [place], [address] Phone: [phone]   Fax:  [phone]  Physical Therapy Treatment  Patient Details  Name: [name] MRN: [id number] Date of Birth: Sep 01, 1955 Referring Provider: Jean Rosenthal  Encounter Date: 02/03/2016      PT End of Session - 02/03/16 0848    Visit Number 10   Number of Visits [place] - Visit Number 10   Authorization - Number of Visits 10   PT Start Time 0815   PT Stop Time 0850   PT Time Calculation (min) 35 min   Activity Tolerance Patient tolerated treatment well      Past Medical History  Diagnosis Date  . GERD (gastroesophageal reflux disease)   . Arthritis   . Aspiration pneumonia ([place]) 03/24/2014  . Hypertension   . Difficulty urinating   . Anxiety   . Colostomy in place [facility])   . Ileostomy present ([place])   . Shortening, leg, congenital     LEFT  . Rectal cancer, uT2uN0, s/p LAR/ileostomy 03/20/2014 colostomy Aug2015 12/25/2013    TCS MAY 2015   . Skin irritation     "around ostomy site" -dry and reddened.    Past Surgical History  Procedure Laterality Date  . Colonoscopy N/A 12/22/2013    SLF: 1. Normal mucosa in the terminal ilium. 2. Moderate diverticulosis in the transverse colon, descending colon, and sigmoid colon. 3. Rectal bleeding due ot rectal mass and hemorrhoids 4. Moderate sized internal hemorrhoids.   . Esophagogastroduodenoscopy N/A 12/22/2013    SLF: Probable Candia esophagitis2. Probable Barretts esphagus 3. Mild gastritis.   . Eus N/A 01/01/2014    Procedure: LOWER ENDOSCOPIC ULTRASOUND (EUS);  Surgeon: Milus Banister, MD;  [facility];  Service: Endoscopy;  Laterality: N/A;  . Hip fracture surgery Left age 54 on 04-06-1970  . Eye reattachment and ear reattachment  Right 1971  . Colon resection N/A 03/24/2014    Procedure: Diagnostic  laparotomy, takedown of coloanal anastamosis, end  colostomy, with wound vac;  Surgeon: Adin Hector, MD;  [facility];  Service: General;  Laterality: N/A;  . Flexible sigmoidoscopy N/A 03/24/2014    Procedure: FLEXIBLE SIGMOIDOSCOPY;  Surgeon: Adin Hector, MD;  [facility];  Service: General;  Laterality: N/A;  . Ileostomy    . Colostomy    . Ileostomy closure N/A 08/25/2014    Procedure: TAKEDOWN OF LOOP ILEOSTOMY ;  Surgeon: Michael Boston, MD;  [facility];  Service: General;  Laterality: N/A;  . Examination under anesthesia  08/25/2014    Procedure: Jasmine December UNDER ANESTHESIA;  Surgeon: Michael Boston, MD;  [facility];  Service: General;;  . Colonoscopy N/A 01/19/2015    Procedure: COLONOSCOPY;  Surgeon: Danie Binder, MD;  [facility];  Service: Endoscopy;  Laterality: N/A;  1215  . Total hip arthroplasty Left 11/19/2015    Procedure: LEFT TOTAL HIP ARTHROPLASTY ANTERIOR APPROACH;  Surgeon: Mcarthur Rossetti, MD;  [facility];  Service: Orthopedics;  Laterality: Left;    There were no vitals filed for this visit.   Subjective:  Pt has no complaints of pain.  States that he is getting feeling in his fingers back.  Pain:  0/10        [facility] PT Assessment - 02/03/16 0001    Assessment   Medical Diagnosis cervical pain Left greater than right    Referring Provider Harrell Gave  [place]   Onset Date/Surgical Date 12/04/15   Hand Dominance Left   Next MD Visit 02/08/2016   Prior Therapy none    Precautions   Precautions Anterior Hip   Restrictions   Weight Bearing Restrictions No   Balance Screen   Has the patient fallen in the past 6 months No   Has the patient had a decrease in activity level because of a fear of falling?  Yes   Is the patient reluctant to leave their home because of a fear of falling?  No   Home Ecologist residence   Prior Function   Level of Independence Independent with community mobility with device  uses cane    Vocation Retired    Leisure active on farm, Database administrator, operates heavy equipment.    Cognition   Overall Cognitive Status Within Functional Limits for tasks assessed   Observation/Other Assessments   Focus on Therapeutic Outcomes (FOTO)  69   Posture/Postural Control   Posture/Postural Control Postural limitations   Postural Limitations Rounded Shoulders;Increased thoracic kyphosis   AROM   Cervical Flexion wnl   no increase sx    Cervical Extension wnl  reps no pain    Cervical - Right Side Bend wnl  symptoms no change   Cervical - Left Side Bend wnl   Cervical - Right Rotation wnl   Cervical - Left Rotation wnl   was decreased 20%    Strength   Strength Assessment Site --  strength wnl    Palpation   Palpation comment mm spasm in Lt upper trap moderate spasm Lt upper trap;  Lt upper trap muscular tight throughout                              PT Education - 02/03/16 0846    Education provided Yes   Education Details reviewed exercise program; self care including importance of posture.   Person(s) Educated Patient   Methods Explanation   Comprehension Verbalized understanding          PT Short Term Goals - 02/03/16 0833    PT SHORT TERM GOAL #1   Title Pt to be Independent in a home exercise program to reduce pain to no greater than a 5/10 to alllow improved ROM    Time 3   Period Weeks   Status Achieved   PT SHORT TERM GOAL #2   Title Pt to be demonstrate functional left cervical rotation to allow paient to be able to see the blind side when driving    Time 3   Period Weeks   Status Achieved   PT SHORT TERM GOAL #3   Title Pt to be able to verbalize the importance of correct sitting posture in neck care    Time 3   Period Weeks   Status Achieved           PT Long Term Goals - 02/03/16 8891    PT LONG TERM GOAL #1   Title Pt Lt ring and little finger numbness  to be gone  to allow pt to sleep without waking up.    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    Time 6   Period Weeks   Status Achieved   PT LONG TERM GOAL #4   Title Lt UE shoulder flexion to be WNL in order for patient to be able to throw a softball overhead to grand daughter   Baseline currently with 156 degrees flexion   Time 6   Period Weeks   Status Achieved               Plan - 02-19-2016 0848    Clinical Impression Statement Mr. Noreen comes into treatment today stating that he is doing his exercises, has no pain and is strarting to get feeling back into his fingers.   All goals have been met.  Pt is ready for discharge at this time.    Rehab Potential Excellent   PT Frequency 3x / week   PT Duration 6 weeks   PT Treatment/Interventions ADLs/Self Care Home Management;Patient/family education   PT Next Visit Plan discharge pt as all goals are met; pt has good form with HEP    Consulted and Agree with Plan of Care Patient      Patient will benefit from skilled therapeutic intervention in order to improve the following deficits and impairments:  Abnormal gait, Decreased activity tolerance, Decreased range of motion, Pain  Visit Diagnosis: Other symptoms and signs involving the musculoskeletal system  Other disturbances of skin sensation  Radiculopathy, cervical region       G-Codes - 19-Feb-2016 0858    Functional Limitation Changing and maintaining body position   Changing and Maintaining Body Position Goal Status (U2725) At least 20 percent but less than 40 percent impaired, limited or restricted   Changing and Maintaining Body Position Discharge Status (D6644) At least 20 percent but  less than 40 percent impaired, limited or restricted      Problem List Patient Active Problem List   Diagnosis Date Noted  . Osteoarthritis of left hip 11/19/2015  . Status post total replacement of left hip 11/19/2015  . Avascular necrosis of bone of left hip ([place]) 10/06/2015  . Acquired inequality of length of lower legs 10/06/2015  . Colostomy in place [facility])   . Essential hypertension, benign 05/12/2014  . Tachycardia 04/14/2014  . Thrombocytosis ([place]) 04/14/2014  . Eustachian tube dysfunction 04/07/2014  . Obesity (BMI 30-39.9) 03/24/2014  . Rectal cancer, uT2uN0, s/p LAR/ileostomy 03/20/2014 colostomy [id number] 12/25/2013  . Hemorrhoids, internal 12/25/2013  . GERD (gastroesophageal reflux disease) 11/27/2013  . Atypical chest pain 11/10/2013  . Tobacco abuse, in remission 11/10/2013    Rayetta Humphrey, PT CLT [phone] 2016/02/19, 8:59 AM  [place], [place], [address] Phone: [phone]   Fax:  [phone]  Name: [name] MRN: [id number] Date of Birth: 12/28/1955  PHYSICAL THERAPY DISCHARGE SUMMARY  Visits from Start of Care: 10  Current functional level related to goals / functional outcomes: As above   Remaining deficits: none   Education / Equipment: HEP for posture and ROM  Plan: Patient agrees to discharge.  Patient goals were met. Patient is being discharged due to meeting the stated rehab goals.  ?????       Rayetta Humphrey, [place] CLT [phone]

## 2016-02-08 ENCOUNTER — Encounter (HOSPITAL_COMMUNITY): Payer: BLUE CROSS/BLUE SHIELD | Admitting: Physical Therapy

## 2016-02-10 ENCOUNTER — Encounter (HOSPITAL_COMMUNITY): Payer: BLUE CROSS/BLUE SHIELD | Admitting: Physical Therapy

## 2016-02-15 ENCOUNTER — Encounter (HOSPITAL_COMMUNITY): Payer: BLUE CROSS/BLUE SHIELD | Admitting: Physical Therapy

## 2016-02-17 ENCOUNTER — Encounter (HOSPITAL_COMMUNITY): Payer: BLUE CROSS/BLUE SHIELD | Admitting: Physical Therapy

## 2016-05-11 ENCOUNTER — Ambulatory Visit (INDEPENDENT_AMBULATORY_CARE_PROVIDER_SITE_OTHER): Payer: BLUE CROSS/BLUE SHIELD | Admitting: Family Medicine

## 2016-05-11 ENCOUNTER — Encounter: Payer: Self-pay | Admitting: Family Medicine

## 2016-05-11 VITALS — BP 124/80 | Ht 64.0 in | Wt 193.0 lb

## 2016-05-11 DIAGNOSIS — Z125 Encounter for screening for malignant neoplasm of prostate: Secondary | ICD-10-CM | POA: Diagnosis not present

## 2016-05-11 DIAGNOSIS — K219 Gastro-esophageal reflux disease without esophagitis: Secondary | ICD-10-CM

## 2016-05-11 DIAGNOSIS — E785 Hyperlipidemia, unspecified: Secondary | ICD-10-CM

## 2016-05-11 DIAGNOSIS — I10 Essential (primary) hypertension: Secondary | ICD-10-CM | POA: Diagnosis not present

## 2016-05-11 DIAGNOSIS — Z23 Encounter for immunization: Secondary | ICD-10-CM | POA: Diagnosis not present

## 2016-05-11 DIAGNOSIS — Z79899 Other long term (current) drug therapy: Secondary | ICD-10-CM

## 2016-05-11 MED ORDER — METOPROLOL TARTRATE 25 MG PO TABS: 25.0000 mg | ORAL_TABLET | Freq: Two times a day (BID) | ORAL | 5 refills | Status: DC

## 2016-05-11 NOTE — Progress Notes (Signed)
   Subjective:    Patient ID: [name], male    DOB: Jan 01, 1956, 60 y.o.   MRN: [id number]  Hypertension  This is a chronic problem. The current episode started more than 1 year ago. The problem has been gradually improving since onset. There are no associated agents to hypertension. There are no known risk factors for coronary artery disease. Treatments tried: metoprolol. The current treatment provides moderate improvement. There are no compliance problems.    Patient thinks he has an abdominal hernia.Patient notes swelling at times right upper quadrant. Crying at site of prior scar. Worries a he may have a hernia.  History of reflux states overall stable this time.  Has a colonoscopy colostomy. States overall doing well. No skin breakdown best she can tell using it well without any difficulties Feels a hernia and so he thinks  Swelling   Review of Systems No headache, no major weight loss or weight gain, no chest pain no back pain abdominal pain no change in bowel habits complete ROS otherwise negative     Objective:   Physical Exam  Alert vitals stable, NAD. Blood pressure good on repeat. HEENT normal. Lungs clear. Heart regular rate and rhythm. Right lateral abdomen no hernia palpated discussed left lower abdomen colostomy present appears in good shape      Assessment & Plan:  Impression #1 hyperlipidemia status uncertain we'll check today #2 hypertension good control discussed maintain same #3 history of colon cancer now with colostomy colostomy bag was good #4 reflux clinically stable #5 abdominal concerns slight thinning of surgical wound but no hernia plan reassurance flu shots today appropriate blood work diet exercise discussed recheck in 6 months wellness exam at that time [facility]

## 2016-05-12 LAB — LIPID PANEL
CHOL/HDL RATIO: 3.5 ratio (ref 0.0–5.0)
CHOLESTEROL TOTAL: 166 mg/dL (ref 100–199)
HDL: 47 mg/dL (ref >39)
LDL Calculated: 108 mg/dL — ABNORMAL HIGH (ref 0–99)
TRIGLYCERIDES: 53 mg/dL (ref 0–149)
VLDL Cholesterol Cal: 11 mg/dL (ref 5–40)

## 2016-05-12 LAB — BASIC METABOLIC PANEL
BUN / CREAT RATIO: 20 (ref 9–20)
BUN: 18 mg/dL (ref 6–24)
CHLORIDE: 103 mmol/L (ref 96–106)
CO2: 25 mmol/L (ref 18–29)
CREATININE: 0.88 mg/dL (ref 0.76–1.27)
Calcium: 9.2 mg/dL (ref 8.7–10.2)
GFR calc non Af Amer: 94 mL/min/{1.73_m2} (ref >59)
GFR, EST AFRICAN AMERICAN: 109 mL/min/{1.73_m2} (ref >59)
GLUCOSE: 99 mg/dL (ref 65–99)
Potassium: 4.7 mmol/L (ref 3.5–5.2)
SODIUM: 143 mmol/L (ref 134–144)

## 2016-05-12 LAB — HEPATIC FUNCTION PANEL
ALK PHOS: 101 IU/L (ref 39–117)
ALT: 17 IU/L (ref 0–44)
AST: 13 IU/L (ref 0–40)
Albumin: 3.9 g/dL (ref 3.5–5.5)
BILIRUBIN TOTAL: 0.4 mg/dL (ref 0.0–1.2)
BILIRUBIN, DIRECT: 0.11 mg/dL (ref 0.00–0.40)
Total Protein: 6.6 g/dL (ref 6.0–8.5)

## 2016-05-12 LAB — PSA: Prostate Specific Ag, Serum: 1.9 ng/mL (ref 0.0–4.0)

## 2016-05-14 ENCOUNTER — Encounter: Payer: Self-pay | Admitting: Family Medicine

## 2016-05-22 ENCOUNTER — Other Ambulatory Visit: Payer: Self-pay | Admitting: Family Medicine

## 2016-08-31 DIAGNOSIS — Z433 Encounter for attention to colostomy: Secondary | ICD-10-CM | POA: Diagnosis not present

## 2016-09-25 ENCOUNTER — Telehealth: Payer: Self-pay | Admitting: *Deleted

## 2016-09-25 NOTE — Telephone Encounter (Signed)
Fax from [place]. Omeprazole 20mg  capsule #60 per 30 days approved from 08/21/16 - 08/20/17. Pt and pharm notified. Copy of approval letter scanned into chart.

## 2016-11-09 ENCOUNTER — Encounter (INDEPENDENT_AMBULATORY_CARE_PROVIDER_SITE_OTHER): Payer: Self-pay | Admitting: Physician Assistant

## 2016-11-09 ENCOUNTER — Encounter: Payer: BLUE CROSS/BLUE SHIELD | Admitting: Family Medicine

## 2016-11-09 ENCOUNTER — Ambulatory Visit (INDEPENDENT_AMBULATORY_CARE_PROVIDER_SITE_OTHER): Payer: Medicare HMO | Admitting: Physician Assistant

## 2016-11-09 ENCOUNTER — Ambulatory Visit (INDEPENDENT_AMBULATORY_CARE_PROVIDER_SITE_OTHER): Payer: Medicare HMO

## 2016-11-09 DIAGNOSIS — Z96642 Presence of left artificial hip joint: Secondary | ICD-10-CM

## 2016-11-09 NOTE — Progress Notes (Signed)
Office Visit Note   Patient: [name]           Date of Birth: 03-25-1956           MRN: [id number] Visit Date: 11/09/2016              Requested by: [name], MD [place], [place] [address] PCP: Mickie Hillier, MD   Assessment & Plan: Visit Diagnoses:  1. Presence of left artificial hip joint     Plan: Follow-up with [place] on a when necessary basis.  Follow-Up Instructions: Return if symptoms worsen or fail to improve.   Orders:  Orders Placed This Encounter  Procedures  . XR HIP UNILAT W OR W/O PELVIS 2-3 VIEWS LEFT   No orders of the defined types were placed in this encounter.     Procedures: No procedures performed   Clinical Data: No additional findings.   Subjective: Chief Complaint  Patient presents with  . Left Hip - Follow-up    HPI [name] returns today 1 year status post left total hip arthroplasty. He is overall doing very well. He states he can now sleep at night which she had a hard time getting sleep due to the hip pain on the left prior to surgery. Still has not got full range of motion of the hip though. States he wants to work more on his range of motion this year. He has no complaints otherwise. Review of Systems   Objective: Vital Signs: There were no vitals taken for this visit.  Physical Exam  Ortho Exam He has good range of motion across his left hip No tenderness over the trochanteric region. Left Calf supple nontender Specialty Comments:  No specialty comments available.  Imaging: Xr Hip Unilat W Or W/o Pelvis 2-3 Views Left  Result Date: 11/09/2016 P pelvis lateral view of the left hip: No acute fracture. Well-seated total hip arthroplasty components without complication. Hips well located. Bony abnormalities otherwise.    PMFS History: Patient Active Problem List   Diagnosis Date Noted  . Presence of left artificial hip joint 11/09/2016  . Osteoarthritis of left hip 11/19/2015  .  Status post total replacement of left hip 11/19/2015  . Avascular necrosis of bone of left hip ([place]) 10/06/2015  . Acquired inequality of length of lower legs 10/06/2015  . Colostomy in place [facility])   . Essential hypertension, benign 05/12/2014  . Tachycardia 04/14/2014  . Thrombocytosis ([place]) 04/14/2014  . Eustachian tube dysfunction 04/07/2014  . Obesity (BMI 30-39.9) 03/24/2014  . Rectal cancer, uT2uN0, s/p LAR/ileostomy 03/20/2014 colostomy [id number] 12/25/2013  . Hemorrhoids, internal 12/25/2013  . GERD (gastroesophageal reflux disease) 11/27/2013  . Atypical chest pain 11/10/2013  . Tobacco abuse, in remission 11/10/2013   Past Medical History:  Diagnosis Date  . Anxiety   . Arthritis   . Aspiration pneumonia ([place]) 03/24/2014  . Colostomy in place [facility])   . Difficulty urinating   . GERD (gastroesophageal reflux disease)   . Hypertension   . Ileostomy present ([place])   . Rectal cancer, uT2uN0, s/p LAR/ileostomy 03/20/2014 colostomy [id number] 12/25/2013   TCS MAY 2015   . Shortening, leg, congenital    LEFT  . Skin irritation    "around ostomy site" -dry and reddened.    Family History  Problem Relation Age of Onset  . Colon polyps Neg Hx   . Colon cancer Sister     Past Surgical History:  Procedure Laterality Date  .  COLON RESECTION N/A 03/24/2014   Procedure: Diagnostic  laparotomy, takedown of coloanal anastamosis, end colostomy, with wound vac;  Surgeon: Adin Hector, MD;  [facility];  Service: General;  Laterality: N/A;  . COLONOSCOPY N/A 12/22/2013   SLF: 1. Normal mucosa in the terminal ilium. 2. Moderate diverticulosis in the transverse colon, descending colon, and sigmoid colon. 3. Rectal bleeding due ot rectal mass and hemorrhoids 4. Moderate sized internal hemorrhoids.   . COLONOSCOPY N/A 01/19/2015   Procedure: COLONOSCOPY;  Surgeon: Danie Binder, MD;  [facility];  Service: Endoscopy;  Laterality: N/A;  1215  . COLOSTOMY    .  ESOPHAGOGASTRODUODENOSCOPY N/A 12/22/2013   SLF: Probable Candia esophagitis2. Probable Barretts esphagus 3. Mild gastritis.   . EUS N/A 01/01/2014   Procedure: LOWER ENDOSCOPIC ULTRASOUND (EUS);  Surgeon: Milus Banister, MD;  [facility];  Service: Endoscopy;  Laterality: N/A;  . EXAMINATION UNDER ANESTHESIA  08/25/2014   Procedure: EXAM UNDER ANESTHESIA;  Surgeon: Michael Boston, MD;  [facility];  Service: General;;  . eye reattachment and ear reattachment  Right 1971  . FLEXIBLE SIGMOIDOSCOPY N/A 03/24/2014   Procedure: FLEXIBLE SIGMOIDOSCOPY;  Surgeon: Adin Hector, MD;  [facility];  Service: General;  Laterality: N/A;  . HIP FRACTURE SURGERY Left age 62 on 04-06-1970  . ILEOSTOMY    . ILEOSTOMY CLOSURE N/A 08/25/2014   Procedure: TAKEDOWN OF LOOP ILEOSTOMY ;  Surgeon: Michael Boston, MD;  [facility];  Service: General;  Laterality: N/A;  . TOTAL HIP ARTHROPLASTY Left 11/19/2015   Procedure: LEFT TOTAL HIP ARTHROPLASTY ANTERIOR APPROACH;  Surgeon: Mcarthur Rossetti, MD;  [facility];  Service: Orthopedics;  Laterality: Left;   Social History   Occupational History  . Not on file.   Social History Main Topics  . Smoking status: Former Smoker    Packs/day: 0.00    Years: 15.00    Types: Cigars    Quit date: 09/10/2011  . Smokeless tobacco: Never Used  . Alcohol use No     Comment: quit years ago  . Drug use: No  . Sexual activity: Not on file

## 2016-11-21 ENCOUNTER — Encounter: Payer: Self-pay | Admitting: Gastroenterology

## 2016-11-22 ENCOUNTER — Other Ambulatory Visit: Payer: Self-pay | Admitting: Family Medicine

## 2016-11-29 DIAGNOSIS — Z433 Encounter for attention to colostomy: Secondary | ICD-10-CM | POA: Diagnosis not present

## 2017-01-17 ENCOUNTER — Ambulatory Visit (INDEPENDENT_AMBULATORY_CARE_PROVIDER_SITE_OTHER): Payer: Medicare HMO | Admitting: Gastroenterology

## 2017-01-17 ENCOUNTER — Telehealth: Payer: Self-pay

## 2017-01-17 ENCOUNTER — Other Ambulatory Visit: Payer: Self-pay

## 2017-01-17 ENCOUNTER — Encounter: Payer: Self-pay | Admitting: Gastroenterology

## 2017-01-17 DIAGNOSIS — K435 Parastomal hernia without obstruction or  gangrene: Secondary | ICD-10-CM | POA: Insufficient documentation

## 2017-01-17 DIAGNOSIS — C20 Malignant neoplasm of rectum: Secondary | ICD-10-CM

## 2017-01-17 DIAGNOSIS — Z85048 Personal history of other malignant neoplasm of rectum, rectosigmoid junction, and anus: Secondary | ICD-10-CM

## 2017-01-17 DIAGNOSIS — K219 Gastro-esophageal reflux disease without esophagitis: Secondary | ICD-10-CM | POA: Diagnosis not present

## 2017-01-17 DIAGNOSIS — K648 Other hemorrhoids: Secondary | ICD-10-CM

## 2017-01-17 DIAGNOSIS — K439 Ventral hernia without obstruction or gangrene: Secondary | ICD-10-CM | POA: Diagnosis not present

## 2017-01-17 NOTE — Assessment & Plan Note (Signed)
.  SYMP

## 2017-01-17 NOTE — Telephone Encounter (Signed)
PA info CT abd/pelvis with contrast submitted via [employer]. Case pending. Service order: [id number]. Additional clinical info faxed to [place].

## 2017-01-17 NOTE — Telephone Encounter (Signed)
Pt called office and he is aware of CT abd/pelvis appt and instructions. Also informed pt that referral info was faxed to [facility] for Dr. Johney Maine.

## 2017-01-17 NOTE — Assessment & Plan Note (Signed)
LAST TCS JUN 2016.  NEXT COLONOSCOPY JUN 2019.

## 2017-01-17 NOTE — Patient Instructions (Signed)
COMPLETE CT SCAN.  SEE DR. Johney Maine TO DISCUSS ABDOMINAL WALL DEFECT.  CONTINUE OMEPRAZOLE.  TAKE 30 MINUTES PRIOR TO YOUR FIRST MEAL.  FOLLOW UP IN 6 MOS.

## 2017-01-17 NOTE — Assessment & Plan Note (Signed)
IN LLQ. LAST SURGERY 2016.  COMPLETE CT SCAN. SEE DR. Johney Maine TO DISCUSS ABDOMINAL WALL DEFECT. FOLLOW UP IN 6 MOS.   GREATER THAN 50% WAS SPENT IN COUNSELING & COORDINATION OF CARE WITH THE PATIENT: DISCUSSED DIFFERENTIAL DIAGNOSIS, PROCEDURE, BENEFITS, AND MANAGEMENT OF ABDOMINAL WALL HERNIA. TOTAL ENCOUNTER TIME: 25 MINS.

## 2017-01-17 NOTE — Telephone Encounter (Signed)
CT abd/pelvis with contrast scheduled for 01/24/17 at 12:00pm, pt to arrive at 11:45am. NPO 4 hours prior to test. Pt to pick-up contrast before test. Tried to call pt, no answer, left detailed message on voicemail and informed him of CT scan. Letter also mailed.

## 2017-01-17 NOTE — Assessment & Plan Note (Signed)
SYMPTOMS CONTROLLED/RESOLVED.  CONTINUE OMEPRAZOLE.  TAKE 30 MINUTES PRIOR TO YOUR MEAL  DAILY. CONTINUE TO MONITOR SYMPTOMS. FOLLOW UP IN 6 MOS.

## 2017-01-17 NOTE — Progress Notes (Signed)
Subjective:    Patient ID: [name], male    DOB: 1956/07/26, 61 y.o.   MRN: [id number] Connor Kirschner, MD  HPI JUST HAD A BABY DONKEY. HAS TEN. HAS 70 HEAD OF CATTLE.BOWELS DOING GOOD. DOWN IN HIS BACK AND KIDNEYS OUT OF WACK. WONDERS WHY COLOSTOMY SIDE BULGES OUT. LAST CT AUG 2015. BMs: EMPTIES BAG DEPENDING ON WHAT HE EATS, 2X/DAY. NL STOOL. CAN GET SHORT WINDED. OMEPRAZOLE CONTROLS GEARTBURN.   PT DENIES FEVER, CHILLS, HEMATOCHEZIA, nausea, vomiting, melena, diarrhea, CHEST PAIN, SHORTNESS OF BREATH, CHANGE IN BOWEL IN HABITS, constipation, abdominal pain, problems swallowing, OR heartburn or indigestion.  Past Medical History:  Diagnosis Date  . Anxiety   . Arthritis   . Aspiration pneumonia ([place]) 03/24/2014  . Colostomy in place [facility])   . Difficulty urinating   . GERD (gastroesophageal reflux disease)   . Hypertension   . Ileostomy present ([place])   . Rectal cancer, uT2uN0, s/p LAR/ileostomy 03/20/2014 colostomy Aug2015 12/25/2013   TCS MAY 2015   . Shortening, leg, congenital    LEFT  . Skin irritation    "around ostomy site" -dry and reddened.    Past Surgical History:  Procedure Laterality Date  . COLON RESECTION N/A 03/24/2014   Procedure: Diagnostic  laparotomy, takedown of coloanal anastamosis, end colostomy, with wound vac;  Surgeon: Adin Hector, MD;  [facility];  Service: General;  Laterality: N/A;  . COLONOSCOPY N/A 12/22/2013   SLF: 1. Normal mucosa in the terminal ilium. 2. Moderate diverticulosis in the transverse colon, descending colon, and sigmoid colon. 3. Rectal bleeding due ot rectal mass and hemorrhoids 4. Moderate sized internal hemorrhoids.   . COLONOSCOPY N/A 01/19/2015   Procedure: COLONOSCOPY;  Surgeon: Danie Binder, MD;  [facility];  Service: Endoscopy;  Laterality: N/A;  1215  . COLOSTOMY    . ESOPHAGOGASTRODUODENOSCOPY N/A 12/22/2013   SLF: Probable Candia esophagitis2. Probable Barretts esphagus 3. Mild gastritis.   .  EUS N/A 01/01/2014   Procedure: LOWER ENDOSCOPIC ULTRASOUND (EUS);  Surgeon: Milus Banister, MD;  Location: [facility];  Service: Endoscopy;  Laterality: N/A;  . EXAMINATION UNDER ANESTHESIA  08/25/2014   Procedure: EXAM UNDER ANESTHESIA;  Surgeon: Michael Boston, MD;  [facility];  Service: General;;  . eye reattachment and ear reattachment  Right 1971  . FLEXIBLE SIGMOIDOSCOPY N/A 03/24/2014   Procedure: FLEXIBLE SIGMOIDOSCOPY;  Surgeon: Adin Hector, MD;  [facility];  Service: General;  Laterality: N/A;  . HIP FRACTURE SURGERY Left age 38 on 04-06-1970  . ILEOSTOMY    . ILEOSTOMY CLOSURE N/A 08/25/2014   Procedure: TAKEDOWN OF LOOP ILEOSTOMY ;  Surgeon: Michael Boston, MD;  [facility];  Service: General;  Laterality: N/A;  . TOTAL HIP ARTHROPLASTY Left 11/19/2015   Procedure: LEFT TOTAL HIP ARTHROPLASTY ANTERIOR APPROACH;  Surgeon: Mcarthur Rossetti, MD;  [facility];  Service: Orthopedics;  Laterality: Left;   No Known Allergies   Current Outpatient Prescriptions  Medication Sig Dispense Refill  . aspirin EC 325 MG EC tablet Take 1 tablet (325 mg total) by mouth 2 (two) times daily after a meal. 30 tablet 0  . IRON PO Take 1 tablet by mouth every morning.    . LUTEIN PO Take 25 mg by mouth 2 (two) times daily.    . metoprolol tartrate (LOPRESSOR) 25 MG tablet Take 1 tablet (25 mg total) by mouth 2 (two) times daily. 60 tablet 5  . Misc  Natural Products (DANDELION ROOT PO) Take 525 mg by mouth 2 (two) times daily.    . Multiple Vitamins-Minerals (ONE DAILY FOR MEN 50+ ADVANCED PO) Take 1 tablet by mouth every morning.     . Omega-3 Fatty Acids (FISH OIL) 1000 MG CAPS Take 1,000 mg by mouth every morning.     Marland Kitchen omeprazole (PRILOSEC) 20 MG capsule TAKE 1 CAPSULE(20 MG) BY MOUTH TWICE DAILY 60 capsule 0  . saw palmetto 500 MG capsule Take 500 mg by mouth 2 (two) times daily.    . Turmeric 500 MG CAPS Take 1 capsule by mouth 2 (two) times daily.    . vitamin C  (ASCORBIC ACID) 500 MG tablet Take 500 mg by mouth 2 (two) times daily.     No current facility-administered medications for this visit.      Review of Systems     Objective:   Physical Exam  Constitutional: He is oriented to person, place, and time. He appears well-developed and well-nourished. No distress.  HENT:  Head: Normocephalic and atraumatic.  Mouth/Throat: Oropharynx is clear and moist. No oropharyngeal exudate.  Eyes: Pupils are equal, round, and reactive to light. No scleral icterus.  Neck: Normal range of motion. Neck supple.  Cardiovascular: Normal rate, regular rhythm and normal heart sounds.   Pulmonary/Chest: Effort normal and breath sounds normal. No respiratory distress.  Abdominal: Soft. Bowel sounds are normal. He exhibits no distension. There is no tenderness.  BULGE IN LLQ THAT REDUCES WHEN PT LAYS DOWN, NONTENDER  Musculoskeletal: He exhibits no edema.  Lymphadenopathy:    He has no cervical adenopathy.  Neurological: He is alert and oriented to person, place, and time.  NO FOCAL DEFICITS  Psychiatric: He has a normal mood and affect.  Vitals reviewed.     Assessment & Plan:

## 2017-01-18 NOTE — Telephone Encounter (Signed)
CT abd/pelvis with contrast approved. Auth# [id number], 01/18/17-04/18/17.

## 2017-01-24 ENCOUNTER — Encounter (HOSPITAL_COMMUNITY): Payer: Self-pay

## 2017-01-24 ENCOUNTER — Ambulatory Visit (HOSPITAL_COMMUNITY)
Admission: RE | Admit: 2017-01-24 | Discharge: 2017-01-24 | Disposition: A | Discharge status: Home / Self Care | Payer: Medicare HMO | Source: Ambulatory Visit | Attending: Gastroenterology | Admitting: Gastroenterology

## 2017-01-24 DIAGNOSIS — K439 Ventral hernia without obstruction or gangrene: Secondary | ICD-10-CM | POA: Insufficient documentation

## 2017-01-24 DIAGNOSIS — Z85048 Personal history of other malignant neoplasm of rectum, rectosigmoid junction, and anus: Secondary | ICD-10-CM | POA: Diagnosis present

## 2017-01-24 DIAGNOSIS — K435 Parastomal hernia without obstruction or  gangrene: Secondary | ICD-10-CM | POA: Insufficient documentation

## 2017-01-24 LAB — POCT I-STAT CREATININE: Creatinine, Ser: 1 mg/dL (ref 0.61–1.24)

## 2017-01-24 MED ORDER — IOPAMIDOL (ISOVUE-300) INJECTION 61%
100.0000 mL | Freq: Once | INTRAVENOUS | Status: AC | PRN
Start: 2017-01-24 — End: 2017-01-24
  Administered 2017-01-24: 100 mL via INTRAVENOUS

## 2017-01-29 ENCOUNTER — Telehealth: Payer: Self-pay | Admitting: Gastroenterology

## 2017-01-29 NOTE — Telephone Encounter (Signed)
PLEASE CALL PT. HIS CT SHOWS A PERISTOMAL HERNIA. HE SHOULD SEE DR. Johney Maine TO DISCUSS REPAIR. HE DOES NOT HAVE ANY EVIDENCE OF CANCER.

## 2017-01-29 NOTE — Telephone Encounter (Signed)
PT is aware.

## 2017-02-12 ENCOUNTER — Ambulatory Visit: Payer: Self-pay | Admitting: Surgery

## 2017-02-12 DIAGNOSIS — Z01818 Encounter for other preprocedural examination: Secondary | ICD-10-CM | POA: Diagnosis not present

## 2017-02-12 DIAGNOSIS — C20 Malignant neoplasm of rectum: Secondary | ICD-10-CM | POA: Diagnosis not present

## 2017-02-12 DIAGNOSIS — K435 Parastomal hernia without obstruction or  gangrene: Secondary | ICD-10-CM | POA: Diagnosis not present

## 2017-02-23 ENCOUNTER — Other Ambulatory Visit: Payer: Self-pay | Admitting: Family Medicine

## 2017-03-06 ENCOUNTER — Other Ambulatory Visit: Payer: Self-pay | Admitting: Family Medicine

## 2017-03-14 ENCOUNTER — Encounter: Payer: Self-pay | Admitting: Family Medicine

## 2017-03-14 ENCOUNTER — Ambulatory Visit (INDEPENDENT_AMBULATORY_CARE_PROVIDER_SITE_OTHER): Payer: Medicare HMO | Admitting: Family Medicine

## 2017-03-14 VITALS — BP 142/88 | Ht 62.0 in | Wt 191.6 lb

## 2017-03-14 DIAGNOSIS — Z79899 Other long term (current) drug therapy: Secondary | ICD-10-CM | POA: Diagnosis not present

## 2017-03-14 DIAGNOSIS — E7800 Pure hypercholesterolemia, unspecified: Secondary | ICD-10-CM

## 2017-03-14 DIAGNOSIS — K439 Ventral hernia without obstruction or gangrene: Secondary | ICD-10-CM

## 2017-03-14 DIAGNOSIS — Z125 Encounter for screening for malignant neoplasm of prostate: Secondary | ICD-10-CM

## 2017-03-14 DIAGNOSIS — M1612 Unilateral primary osteoarthritis, left hip: Secondary | ICD-10-CM

## 2017-03-14 DIAGNOSIS — I10 Essential (primary) hypertension: Secondary | ICD-10-CM

## 2017-03-14 MED ORDER — OMEPRAZOLE 20 MG PO CPDR: DELAYED_RELEASE_CAPSULE | ORAL | 11 refills | Status: DC

## 2017-03-14 MED ORDER — METOPROLOL TARTRATE 25 MG PO TABS: 25.0000 mg | ORAL_TABLET | Freq: Two times a day (BID) | ORAL | 1 refills | Status: DC

## 2017-03-14 NOTE — Progress Notes (Signed)
   Subjective:    Patient ID: [name], male    DOB: August 16, 1956, 61 y.o.   MRN: [id number]  Hypertension  This is a chronic problem. The current episode started more than 1 year ago. Risk factors for coronary artery disease include male gender. Treatments tried: lopressor. There are no compliance problems.    Walking and exrcise good these days  Heat wearng pt down at times, dim energy  Ongoing challenges with reflux. States every time he tries to stop the omeprazole he has to get back on it.  Reports progressive abdominal discomfort with his hernia. Due to have surgical repair of this soon.  Reports tolerance of his colostomy bag. No major difficulties with it. Has pre-much gotten used to it  Review of Systems No headache, no major weight loss or weight gain, no chest pain no back pain abdominal pain no change in bowel habits complete ROS otherwise negative     Objective:   Physical Exam Alert and oriented, vitals reviewed and stable, NAD ENT-TM's and ext canals WNL bilat via otoscopic exam Soft palate, tonsils and post pharynx WNL via oropharyngeal exam Neck-symmetric, no masses; thyroid nonpalpable and nontender Pulmonary-no tachypnea or accessory muscle use; Clear without wheezes via auscultation Card--no abnrml murmurs, rhythm reg and rate WNL Carotid pulses symmetric, without bruits Obesity present. Abdomen non-tender except at site of abdominal hernia near the colostomy site colostomy site looks good       Assessment & Plan:  Impression 1 recurrent reflux ongoing need for meds discussed maintain #2 hypertension good control on current meds discussed #3 abdominal hernia pending surgery discussed #4 colostomy care doing well discussed/medications refilled. Diet exercise discussed. Appropriate blood work  Greater than 50% of this 25 minute face to face visit was spent in counseling and discussion and coordination of care regarding the above diagnosis/diagnosies

## 2017-03-15 ENCOUNTER — Encounter (HOSPITAL_COMMUNITY): Payer: Self-pay | Admitting: *Deleted

## 2017-03-15 LAB — BASIC METABOLIC PANEL
BUN/Creatinine Ratio: 26 — ABNORMAL HIGH (ref 10–24)
BUN: 25 mg/dL (ref 8–27)
CALCIUM: 9.5 mg/dL (ref 8.6–10.2)
CHLORIDE: 103 mmol/L (ref 96–106)
CO2: 26 mmol/L (ref 20–29)
Creatinine, Ser: 0.95 mg/dL (ref 0.76–1.27)
GFR calc Af Amer: 100 mL/min/{1.73_m2} (ref >59)
GFR calc non Af Amer: 87 mL/min/{1.73_m2} (ref >59)
GLUCOSE: 95 mg/dL (ref 65–99)
Potassium: 5.4 mmol/L — ABNORMAL HIGH (ref 3.5–5.2)
Sodium: 140 mmol/L (ref 134–144)

## 2017-03-15 LAB — LIPID PANEL
CHOL/HDL RATIO: 3.9 ratio (ref 0.0–5.0)
Cholesterol, Total: 173 mg/dL (ref 100–199)
HDL: 44 mg/dL (ref >39)
LDL CALC: 115 mg/dL — AB (ref 0–99)
TRIGLYCERIDES: 70 mg/dL (ref 0–149)
VLDL Cholesterol Cal: 14 mg/dL (ref 5–40)

## 2017-03-15 LAB — HEPATIC FUNCTION PANEL
ALT: 14 IU/L (ref 0–44)
AST: 15 IU/L (ref 0–40)
Albumin: 4 g/dL (ref 3.6–4.8)
Alkaline Phosphatase: 71 IU/L (ref 39–117)
Bilirubin Total: 0.3 mg/dL (ref 0.0–1.2)
Bilirubin, Direct: 0.09 mg/dL (ref 0.00–0.40)
TOTAL PROTEIN: 6.2 g/dL (ref 6.0–8.5)

## 2017-03-15 LAB — PSA: PROSTATE SPECIFIC AG, SERUM: 2.1 ng/mL (ref 0.0–4.0)

## 2017-03-20 NOTE — Addendum Note (Signed)
Addended by: Dairl Ponder on: 03/20/2017 11:55 AM   Modules accepted: Orders

## 2017-03-30 NOTE — Patient Instructions (Signed)
[name]  03/30/2017   Your procedure is scheduled on: 04/11/2017    Report to [facility]  Entrance Take [place]  elevators to 3rd floor to  [place] at       1000 AM.     Call this number if you have problems the morning of surgery [phone]    Remember: ONLY 1 PERSON MAY GO WITH YOU TO SHORT STAY TO GET  READY MORNING OF [place].  Do not eat food or drink liquids :After Midnight.     Take these medicines the morning of surgery with A SIP OF WATER: metoprolol ( Lopressor), prilosec                                 You may not have any metal on your body including hair pins and              piercings  Do not wear jewelry,  lotions, powders or perfumes, deodorant                         Men may shave face and neck.   Do not bring valuables to the hospital. [place].  Contacts, dentures or bridgework may not be worn into surgery.  Leave suitcase in the car. After surgery it may be brought to your room.                     Please read over the following fact sheets you were given: _____________________________________________________________________             [facility] - Preparing for Surgery Before surgery, you can play an important role.  Because skin is not sterile, your skin needs to be as free of germs as possible.  You can reduce the number of germs on your skin by washing with CHG (chlorahexidine gluconate) soap before surgery.  CHG is an antiseptic cleaner which kills germs and bonds with the skin to continue killing germs even after washing. Please DO NOT use if you have an allergy to CHG or antibacterial soaps.  If your skin becomes reddened/irritated stop using the CHG and inform your nurse when you arrive at Short Stay. Do not shave (including legs and underarms) for at least 48 hours prior to the first CHG shower.  You may shave your face/neck. Please follow  these instructions carefully:  1.  Shower with CHG Soap the night before surgery and the  morning of Surgery.  2.  If you choose to wash your hair, wash your hair first as usual with your  normal  shampoo.  3.  After you shampoo, rinse your hair and body thoroughly to remove the  shampoo.                           4.  Use CHG as you would any other liquid soap.  You can apply chg directly  to the skin and wash                       Gently with a scrungie or clean washcloth.  5.  Apply  the CHG Soap to your body ONLY FROM THE NECK DOWN.   Do not use on face/ open                           Wound or open sores. Avoid contact with eyes, ears mouth and genitals (private parts).                       Wash face,  Genitals (private parts) with your normal soap.             6.  Wash thoroughly, paying special attention to the area where your surgery  will be performed.  7.  Thoroughly rinse your body with warm water from the neck down.  8.  DO NOT shower/wash with your normal soap after using and rinsing off  the CHG Soap.                9.  Pat yourself dry with a clean towel.            10.  Wear clean pajamas.            11.  Place clean sheets on your bed the night of your first shower and do not  sleep with pets. Day of Surgery : Do not apply any lotions/deodorants the morning of surgery.  Please wear clean clothes to the hospital/surgery center.  FAILURE TO FOLLOW THESE INSTRUCTIONS MAY RESULT IN THE CANCELLATION OF YOUR SURGERY PATIENT SIGNATURE_________________________________  NURSE SIGNATURE__________________________________  ________________________________________________________________________

## 2017-04-02 ENCOUNTER — Encounter (HOSPITAL_COMMUNITY): Payer: Self-pay

## 2017-04-02 ENCOUNTER — Encounter (HOSPITAL_COMMUNITY)
Admission: RE | Admit: 2017-04-02 | Discharge: 2017-04-02 | Disposition: A | Discharge status: Home / Self Care | Payer: Medicare HMO | Source: Ambulatory Visit | Attending: Surgery | Admitting: Surgery

## 2017-04-02 DIAGNOSIS — I498 Other specified cardiac arrhythmias: Secondary | ICD-10-CM | POA: Diagnosis not present

## 2017-04-02 DIAGNOSIS — K439 Ventral hernia without obstruction or gangrene: Secondary | ICD-10-CM | POA: Diagnosis not present

## 2017-04-02 DIAGNOSIS — R001 Bradycardia, unspecified: Secondary | ICD-10-CM | POA: Insufficient documentation

## 2017-04-02 DIAGNOSIS — M21769 Unequal limb length (acquired), unspecified tibia and fibula: Secondary | ICD-10-CM | POA: Diagnosis not present

## 2017-04-02 DIAGNOSIS — I10 Essential (primary) hypertension: Secondary | ICD-10-CM | POA: Diagnosis not present

## 2017-04-02 DIAGNOSIS — Z01810 Encounter for preprocedural cardiovascular examination: Secondary | ICD-10-CM | POA: Diagnosis not present

## 2017-04-02 DIAGNOSIS — H6980 Other specified disorders of Eustachian tube, unspecified ear: Secondary | ICD-10-CM | POA: Insufficient documentation

## 2017-04-02 DIAGNOSIS — E669 Obesity, unspecified: Secondary | ICD-10-CM | POA: Insufficient documentation

## 2017-04-02 DIAGNOSIS — Z933 Colostomy status: Secondary | ICD-10-CM | POA: Insufficient documentation

## 2017-04-02 DIAGNOSIS — D473 Essential (hemorrhagic) thrombocythemia: Secondary | ICD-10-CM | POA: Diagnosis not present

## 2017-04-02 DIAGNOSIS — M87052 Idiopathic aseptic necrosis of left femur: Secondary | ICD-10-CM | POA: Diagnosis not present

## 2017-04-02 DIAGNOSIS — Z01812 Encounter for preprocedural laboratory examination: Secondary | ICD-10-CM | POA: Diagnosis not present

## 2017-04-02 LAB — BASIC METABOLIC PANEL
ANION GAP: 6 (ref 5–15)
BUN: 27 mg/dL — ABNORMAL HIGH (ref 6–20)
CHLORIDE: 108 mmol/L (ref 101–111)
CO2: 25 mmol/L (ref 22–32)
CREATININE: 0.91 mg/dL (ref 0.61–1.24)
Calcium: 8.7 mg/dL — ABNORMAL LOW (ref 8.9–10.3)
GFR calc non Af Amer: >60 mL/min (ref >60)
Glucose, Bld: 99 mg/dL (ref 65–99)
POTASSIUM: 4.5 mmol/L (ref 3.5–5.1)
SODIUM: 139 mmol/L (ref 135–145)

## 2017-04-02 LAB — CBC
HCT: 43.4 % (ref 39.0–52.0)
HEMOGLOBIN: 14.9 g/dL (ref 13.0–17.0)
MCH: 31.6 pg (ref 26.0–34.0)
MCHC: 34.3 g/dL (ref 30.0–36.0)
MCV: 91.9 fL (ref 78.0–100.0)
PLATELETS: 279 10*3/uL (ref 150–400)
RBC: 4.72 MIL/uL (ref 4.22–5.81)
RDW: 13.6 % (ref 11.5–15.5)
WBC: 7.6 10*3/uL (ref 4.0–10.5)

## 2017-04-02 NOTE — Progress Notes (Signed)
BMP done 04/02/17 faxed via epic to Dr Johney Maine.

## 2017-04-02 NOTE — Progress Notes (Signed)
Sent email to ostomy nurses asking per patient request for one of them to see on day of surgery prior to surgery.  Oneta Rack nurse to see am of usrgery per patient request.  And patient is aware.  Email printed and on front of chart.

## 2017-04-02 NOTE — Progress Notes (Signed)
Final EKG done 04/02/17-in epic

## 2017-04-10 DIAGNOSIS — Z433 Encounter for attention to colostomy: Secondary | ICD-10-CM | POA: Diagnosis not present

## 2017-04-10 MED ORDER — SODIUM CHLORIDE 0.9 % IV SOLN
INTRAVENOUS | Status: DC
  Administered 2017-04-11: 06:00:00 via INTRAPERITONEAL
  Filled 2017-04-10: qty 6

## 2017-04-11 ENCOUNTER — Encounter (HOSPITAL_COMMUNITY)
Admission: RE | Disposition: A | Discharge status: Home / Self Care | Payer: Self-pay | Source: Ambulatory Visit | Attending: Surgery

## 2017-04-11 ENCOUNTER — Ambulatory Visit (HOSPITAL_COMMUNITY): Payer: Medicare HMO | Admitting: Anesthesiology

## 2017-04-11 ENCOUNTER — Observation Stay (HOSPITAL_COMMUNITY)
Admission: RE | Admit: 2017-04-11 | Discharge: 2017-04-12 | Disposition: A | Discharge status: Home / Self Care | Payer: Medicare HMO | Source: Ambulatory Visit | Attending: Surgery | Admitting: Surgery

## 2017-04-11 ENCOUNTER — Encounter (HOSPITAL_COMMUNITY): Payer: Self-pay | Admitting: *Deleted

## 2017-04-11 DIAGNOSIS — I10 Essential (primary) hypertension: Secondary | ICD-10-CM | POA: Diagnosis present

## 2017-04-11 DIAGNOSIS — K436 Other and unspecified ventral hernia with obstruction, without gangrene: Secondary | ICD-10-CM | POA: Diagnosis not present

## 2017-04-11 DIAGNOSIS — K433 Parastomal hernia with obstruction, without gangrene: Secondary | ICD-10-CM | POA: Insufficient documentation

## 2017-04-11 DIAGNOSIS — K430 Incisional hernia with obstruction, without gangrene: Principal | ICD-10-CM

## 2017-04-11 DIAGNOSIS — G8918 Other acute postprocedural pain: Secondary | ICD-10-CM | POA: Diagnosis not present

## 2017-04-11 DIAGNOSIS — E669 Obesity, unspecified: Secondary | ICD-10-CM | POA: Diagnosis present

## 2017-04-11 DIAGNOSIS — C20 Malignant neoplasm of rectum: Secondary | ICD-10-CM | POA: Diagnosis present

## 2017-04-11 DIAGNOSIS — M87052 Idiopathic aseptic necrosis of left femur: Secondary | ICD-10-CM | POA: Diagnosis present

## 2017-04-11 DIAGNOSIS — Z96642 Presence of left artificial hip joint: Secondary | ICD-10-CM

## 2017-04-11 DIAGNOSIS — Z933 Colostomy status: Secondary | ICD-10-CM | POA: Diagnosis not present

## 2017-04-11 DIAGNOSIS — K660 Peritoneal adhesions (postprocedural) (postinfection): Secondary | ICD-10-CM | POA: Diagnosis not present

## 2017-04-11 DIAGNOSIS — Z433 Encounter for attention to colostomy: Secondary | ICD-10-CM | POA: Diagnosis not present

## 2017-04-11 DIAGNOSIS — M21769 Unequal limb length (acquired), unspecified tibia and fibula: Secondary | ICD-10-CM | POA: Diagnosis present

## 2017-04-11 DIAGNOSIS — K435 Parastomal hernia without obstruction or  gangrene: Secondary | ICD-10-CM | POA: Diagnosis present

## 2017-04-11 HISTORY — PX: VENTRAL HERNIA REPAIR: SHX424

## 2017-04-11 HISTORY — PX: INSERTION OF MESH: SHX5868

## 2017-04-11 SURGERY — REPAIR, HERNIA, VENTRAL, LAPAROSCOPIC
Anesthesia: General | Site: Abdomen

## 2017-04-11 MED ORDER — ROCURONIUM BROMIDE 50 MG/5ML IV SOSY
PREFILLED_SYRINGE | INTRAVENOUS | Status: AC
  Filled 2017-04-11: qty 5

## 2017-04-11 MED ORDER — PHENOL 1.4 % MT LIQD
1.0000 | OROMUCOSAL | Status: DC | PRN
  Filled 2017-04-11: qty 177

## 2017-04-11 MED ORDER — ONDANSETRON HCL 4 MG/2ML IJ SOLN
INTRAMUSCULAR | Status: DC | PRN
  Administered 2017-04-11: 4 mg via INTRAVENOUS

## 2017-04-11 MED ORDER — METHOCARBAMOL 750 MG PO TABS: 750.0000 mg | ORAL_TABLET | Freq: Four times a day (QID) | ORAL | 2 refills | Status: DC | PRN

## 2017-04-11 MED ORDER — LIDOCAINE 2% (20 MG/ML) 5 ML SYRINGE
INTRAMUSCULAR | Status: AC
  Filled 2017-04-11: qty 5

## 2017-04-11 MED ORDER — KETAMINE HCL 10 MG/ML IJ SOLN
INTRAMUSCULAR | Status: AC
  Filled 2017-04-11: qty 1

## 2017-04-11 MED ORDER — 0.9 % SODIUM CHLORIDE (POUR BTL) OPTIME
TOPICAL | Status: DC | PRN
  Administered 2017-04-11: 1000 mL

## 2017-04-11 MED ORDER — MENTHOL 3 MG MT LOZG: 1.0000 | LOZENGE | OROMUCOSAL | Status: DC | PRN

## 2017-04-11 MED ORDER — HYDROMORPHONE HCL-NACL 0.5-0.9 MG/ML-% IV SOSY: 0.2500 mg | PREFILLED_SYRINGE | INTRAVENOUS | Status: DC | PRN

## 2017-04-11 MED ORDER — ACETAMINOPHEN 500 MG PO TABS
1000.0000 mg | ORAL_TABLET | Freq: Three times a day (TID) | ORAL | Status: DC
  Administered 2017-04-11 – 2017-04-12 (×2): 1000 mg via ORAL
  Filled 2017-04-11 (×2): qty 2

## 2017-04-11 MED ORDER — DEXAMETHASONE SODIUM PHOSPHATE 10 MG/ML IJ SOLN
INTRAMUSCULAR | Status: AC
  Filled 2017-04-11: qty 1

## 2017-04-11 MED ORDER — GENTAMICIN SULFATE 40 MG/ML IJ SOLN
5.0000 mg/kg | Freq: Once | INTRAVENOUS | Status: AC
  Administered 2017-04-11: 310 mg via INTRAVENOUS
  Filled 2017-04-11: qty 7.75

## 2017-04-11 MED ORDER — DIAZEPAM 2 MG PO TABS: 5.0000 mg | ORAL_TABLET | Freq: Four times a day (QID) | ORAL | Status: DC | PRN

## 2017-04-11 MED ORDER — PROPOFOL 10 MG/ML IV BOLUS
INTRAVENOUS | Status: DC | PRN
  Administered 2017-04-11: 120 mg via INTRAVENOUS

## 2017-04-11 MED ORDER — SODIUM CHLORIDE 0.9 % IV SOLN: 250.0000 mL | INTRAVENOUS | Status: DC | PRN

## 2017-04-11 MED ORDER — GUAIFENESIN-DM 100-10 MG/5ML PO SYRP: 10.0000 mL | ORAL_SOLUTION | ORAL | Status: DC | PRN

## 2017-04-11 MED ORDER — HYDRALAZINE HCL 20 MG/ML IJ SOLN
5.0000 mg | INTRAMUSCULAR | Status: DC | PRN
  Filled 2017-04-11: qty 1

## 2017-04-11 MED ORDER — SODIUM CHLORIDE 0.9% FLUSH: 3.0000 mL | Freq: Two times a day (BID) | INTRAVENOUS | Status: DC

## 2017-04-11 MED ORDER — METOPROLOL TARTRATE 25 MG PO TABS
25.0000 mg | ORAL_TABLET | Freq: Two times a day (BID) | ORAL | Status: DC
  Administered 2017-04-11 – 2017-04-12 (×2): 25 mg via ORAL
  Filled 2017-04-11 (×2): qty 1

## 2017-04-11 MED ORDER — SUGAMMADEX SODIUM 200 MG/2ML IV SOLN
INTRAVENOUS | Status: DC | PRN
  Administered 2017-04-11: 200 mg via INTRAVENOUS

## 2017-04-11 MED ORDER — BUPIVACAINE-EPINEPHRINE (PF) 0.5% -1:200000 IJ SOLN
INTRAMUSCULAR | Status: DC | PRN
  Administered 2017-04-11 (×2): 20 mL

## 2017-04-11 MED ORDER — BUPIVACAINE-EPINEPHRINE 0.25% -1:200000 IJ SOLN
INTRAMUSCULAR | Status: AC
  Filled 2017-04-11: qty 1

## 2017-04-11 MED ORDER — CHLORHEXIDINE GLUCONATE CLOTH 2 % EX PADS: 6.0000 | MEDICATED_PAD | Freq: Once | CUTANEOUS | Status: DC

## 2017-04-11 MED ORDER — OXYCODONE HCL 5 MG PO TABS
5.0000 mg | ORAL_TABLET | ORAL | Status: DC | PRN
  Administered 2017-04-12: 5 mg via ORAL
  Filled 2017-04-11: qty 1

## 2017-04-11 MED ORDER — DEXAMETHASONE SODIUM PHOSPHATE 10 MG/ML IJ SOLN
INTRAMUSCULAR | Status: DC | PRN
  Administered 2017-04-11: 10 mg via INTRAVENOUS

## 2017-04-11 MED ORDER — FENTANYL CITRATE (PF) 100 MCG/2ML IJ SOLN
INTRAMUSCULAR | Status: AC
  Filled 2017-04-11: qty 2

## 2017-04-11 MED ORDER — FENTANYL CITRATE (PF) 100 MCG/2ML IJ SOLN
INTRAMUSCULAR | Status: DC | PRN
  Administered 2017-04-11 (×4): 25 ug via INTRAVENOUS
  Administered 2017-04-11 (×2): 50 ug via INTRAVENOUS

## 2017-04-11 MED ORDER — OXYCODONE HCL 5 MG PO TABS: 5.0000 mg | ORAL_TABLET | ORAL | 0 refills | Status: DC | PRN

## 2017-04-11 MED ORDER — SIMETHICONE 80 MG PO CHEW: 40.0000 mg | CHEWABLE_TABLET | Freq: Four times a day (QID) | ORAL | Status: DC | PRN

## 2017-04-11 MED ORDER — CEFAZOLIN SODIUM-DEXTROSE 2-4 GM/100ML-% IV SOLN
2.0000 g | INTRAVENOUS | Status: AC
  Administered 2017-04-11: 2 g via INTRAVENOUS
  Filled 2017-04-11: qty 100

## 2017-04-11 MED ORDER — SUCCINYLCHOLINE CHLORIDE 200 MG/10ML IV SOSY
PREFILLED_SYRINGE | INTRAVENOUS | Status: AC
  Filled 2017-04-11: qty 10

## 2017-04-11 MED ORDER — LACTATED RINGERS IV SOLN
INTRAVENOUS | Status: DC
  Administered 2017-04-11 (×2): via INTRAVENOUS

## 2017-04-11 MED ORDER — SODIUM CHLORIDE 0.9 % IV SOLN
INTRAVENOUS | Status: DC
  Administered 2017-04-11: 18:00:00 via INTRAVENOUS

## 2017-04-11 MED ORDER — ONDANSETRON 4 MG PO TBDP: 4.0000 mg | ORAL_TABLET | Freq: Four times a day (QID) | ORAL | Status: DC | PRN

## 2017-04-11 MED ORDER — SUGAMMADEX SODIUM 200 MG/2ML IV SOLN
INTRAVENOUS | Status: AC
  Filled 2017-04-11: qty 2

## 2017-04-11 MED ORDER — STERILE WATER FOR IRRIGATION IR SOLN
Status: DC | PRN
  Administered 2017-04-11: 1000 mL

## 2017-04-11 MED ORDER — BUPIVACAINE-EPINEPHRINE (PF) 0.25% -1:200000 IJ SOLN
INTRAMUSCULAR | Status: AC
  Filled 2017-04-11: qty 30

## 2017-04-11 MED ORDER — LIDOCAINE 2% (20 MG/ML) 5 ML SYRINGE
INTRAMUSCULAR | Status: DC | PRN
Start: 2017-04-11 — End: 2017-04-11
  Administered 2017-04-11: 1.5 mg/kg/h via INTRAVENOUS

## 2017-04-11 MED ORDER — HYDROCORTISONE 2.5 % RE CREA
1.0000 | TOPICAL_CREAM | Freq: Four times a day (QID) | RECTAL | Status: DC | PRN
Start: 2017-04-11 — End: 2017-04-12
  Filled 2017-04-11: qty 28.35

## 2017-04-11 MED ORDER — LACTATED RINGERS IV SOLN: 1000.0000 mL | Freq: Three times a day (TID) | INTRAVENOUS | Status: DC | PRN

## 2017-04-11 MED ORDER — LIP MEDEX EX OINT
1.0000 "application " | TOPICAL_OINTMENT | Freq: Two times a day (BID) | CUTANEOUS | Status: DC
  Administered 2017-04-11 – 2017-04-12 (×2): 1 via TOPICAL
  Filled 2017-04-11 (×2): qty 7

## 2017-04-11 MED ORDER — HYDROCORTISONE 1 % EX CREA
1.0000 "application " | TOPICAL_CREAM | Freq: Three times a day (TID) | CUTANEOUS | Status: DC | PRN
  Filled 2017-04-11: qty 28

## 2017-04-11 MED ORDER — CHLORHEXIDINE GLUCONATE CLOTH 2 % EX PADS
6.0000 | MEDICATED_PAD | Freq: Once | CUTANEOUS | Status: AC
  Administered 2017-04-11: 6 via TOPICAL

## 2017-04-11 MED ORDER — KETAMINE HCL 10 MG/ML IJ SOLN
INTRAMUSCULAR | Status: DC | PRN
  Administered 2017-04-11: 40 mg via INTRAVENOUS

## 2017-04-11 MED ORDER — CELECOXIB 200 MG PO CAPS
400.0000 mg | ORAL_CAPSULE | ORAL | Status: AC
  Administered 2017-04-11: 400 mg via ORAL
  Filled 2017-04-11: qty 2

## 2017-04-11 MED ORDER — BISACODYL 10 MG RE SUPP: 10.0000 mg | Freq: Every day | RECTAL | Status: DC | PRN

## 2017-04-11 MED ORDER — POLYETHYLENE GLYCOL 3350 17 G PO PACK: 17.0000 g | PACK | Freq: Two times a day (BID) | ORAL | Status: DC | PRN

## 2017-04-11 MED ORDER — GABAPENTIN 300 MG PO CAPS
300.0000 mg | ORAL_CAPSULE | ORAL | Status: AC
  Administered 2017-04-11: 300 mg via ORAL
  Filled 2017-04-11: qty 1

## 2017-04-11 MED ORDER — PANTOPRAZOLE SODIUM 40 MG PO TBEC
80.0000 mg | DELAYED_RELEASE_TABLET | Freq: Every day | ORAL | Status: DC
  Administered 2017-04-12: 80 mg via ORAL
  Filled 2017-04-11: qty 2

## 2017-04-11 MED ORDER — ALUM & MAG HYDROXIDE-SIMETH 200-200-20 MG/5ML PO SUSP: 30.0000 mL | Freq: Four times a day (QID) | ORAL | Status: DC | PRN

## 2017-04-11 MED ORDER — ONDANSETRON HCL 4 MG/2ML IJ SOLN
INTRAMUSCULAR | Status: AC
  Filled 2017-04-11: qty 2

## 2017-04-11 MED ORDER — BUPIVACAINE LIPOSOME 1.3 % IJ SUSP
INTRAMUSCULAR | Status: DC | PRN
  Administered 2017-04-11: 20 mL

## 2017-04-11 MED ORDER — ENSURE SURGERY PO LIQD
237.0000 mL | Freq: Two times a day (BID) | ORAL | Status: DC
  Administered 2017-04-11 – 2017-04-12 (×2): 237 mL via ORAL
  Filled 2017-04-11 (×3): qty 237

## 2017-04-11 MED ORDER — PROCHLORPERAZINE EDISYLATE 5 MG/ML IJ SOLN: 5.0000 mg | INTRAMUSCULAR | Status: DC | PRN

## 2017-04-11 MED ORDER — ONDANSETRON HCL 4 MG/2ML IJ SOLN: 4.0000 mg | Freq: Four times a day (QID) | INTRAMUSCULAR | Status: DC | PRN

## 2017-04-11 MED ORDER — ENOXAPARIN SODIUM 40 MG/0.4ML ~~LOC~~ SOLN
40.0000 mg | SUBCUTANEOUS | Status: DC
  Administered 2017-04-12: 40 mg via SUBCUTANEOUS
  Filled 2017-04-11: qty 0.4

## 2017-04-11 MED ORDER — GABAPENTIN 300 MG PO CAPS
300.0000 mg | ORAL_CAPSULE | Freq: Two times a day (BID) | ORAL | Status: DC
  Administered 2017-04-11 – 2017-04-12 (×2): 300 mg via ORAL
  Filled 2017-04-11 (×2): qty 1

## 2017-04-11 MED ORDER — VITAMIN C 500 MG PO TABS
500.0000 mg | ORAL_TABLET | Freq: Two times a day (BID) | ORAL | Status: DC
  Administered 2017-04-11 – 2017-04-12 (×2): 500 mg via ORAL
  Filled 2017-04-11 (×2): qty 1

## 2017-04-11 MED ORDER — BUPIVACAINE-EPINEPHRINE 0.25% -1:200000 IJ SOLN
INTRAMUSCULAR | Status: DC | PRN
  Administered 2017-04-11: 80 mL

## 2017-04-11 MED ORDER — DIPHENHYDRAMINE HCL 50 MG/ML IJ SOLN: 12.5000 mg | Freq: Four times a day (QID) | INTRAMUSCULAR | Status: DC | PRN

## 2017-04-11 MED ORDER — DIPHENHYDRAMINE HCL 12.5 MG/5ML PO ELIX: 12.5000 mg | ORAL_SOLUTION | Freq: Four times a day (QID) | ORAL | Status: DC | PRN

## 2017-04-11 MED ORDER — LIDOCAINE 2% (20 MG/ML) 5 ML SYRINGE
INTRAMUSCULAR | Status: AC
  Filled 2017-04-11: qty 15

## 2017-04-11 MED ORDER — SODIUM CHLORIDE 0.9% FLUSH: 3.0000 mL | INTRAVENOUS | Status: DC | PRN

## 2017-04-11 MED ORDER — CEFAZOLIN SODIUM-DEXTROSE 2-4 GM/100ML-% IV SOLN
2.0000 g | Freq: Three times a day (TID) | INTRAVENOUS | Status: AC
  Administered 2017-04-11: 2 g via INTRAVENOUS
  Filled 2017-04-11: qty 100

## 2017-04-11 MED ORDER — FENTANYL CITRATE (PF) 100 MCG/2ML IJ SOLN
100.0000 ug | Freq: Once | INTRAMUSCULAR | Status: AC
  Administered 2017-04-11: 50 ug via INTRAVENOUS

## 2017-04-11 MED ORDER — BUPIVACAINE LIPOSOME 1.3 % IJ SUSP
20.0000 mL | INTRAMUSCULAR | Status: DC
  Administered 2017-04-11: 266 mg
  Filled 2017-04-11: qty 20

## 2017-04-11 MED ORDER — FENTANYL CITRATE (PF) 100 MCG/2ML IJ SOLN
INTRAMUSCULAR | Status: AC
  Administered 2017-04-11: 50 ug via INTRAVENOUS
  Filled 2017-04-11: qty 2

## 2017-04-11 MED ORDER — ACETAMINOPHEN 500 MG PO TABS
1000.0000 mg | ORAL_TABLET | ORAL | Status: AC
  Administered 2017-04-11: 1000 mg via ORAL
  Filled 2017-04-11: qty 2

## 2017-04-11 MED ORDER — MIDAZOLAM HCL 2 MG/2ML IJ SOLN
INTRAMUSCULAR | Status: AC
  Administered 2017-04-11: 2 mg via INTRAVENOUS
  Filled 2017-04-11: qty 2

## 2017-04-11 MED ORDER — ASPIRIN EC 81 MG PO TBEC
81.0000 mg | DELAYED_RELEASE_TABLET | Freq: Every day | ORAL | Status: DC
  Administered 2017-04-12: 81 mg via ORAL
  Filled 2017-04-11: qty 1

## 2017-04-11 MED ORDER — MIDAZOLAM HCL 2 MG/2ML IJ SOLN
2.0000 mg | Freq: Once | INTRAMUSCULAR | Status: AC
  Administered 2017-04-11: 2 mg via INTRAVENOUS

## 2017-04-11 MED ORDER — LIDOCAINE 2% (20 MG/ML) 5 ML SYRINGE
INTRAMUSCULAR | Status: DC | PRN
  Administered 2017-04-11: 60 mg via INTRAVENOUS

## 2017-04-11 MED ORDER — METHOCARBAMOL 500 MG PO TABS: 1000.0000 mg | ORAL_TABLET | Freq: Four times a day (QID) | ORAL | Status: DC | PRN

## 2017-04-11 MED ORDER — ROCURONIUM BROMIDE 50 MG/5ML IV SOSY
PREFILLED_SYRINGE | INTRAVENOUS | Status: DC | PRN
  Administered 2017-04-11: 5 mg via INTRAVENOUS
  Administered 2017-04-11: 30 mg via INTRAVENOUS
  Administered 2017-04-11 (×3): 10 mg via INTRAVENOUS
  Administered 2017-04-11: 20 mg via INTRAVENOUS
  Administered 2017-04-11 (×4): 5 mg via INTRAVENOUS

## 2017-04-11 MED ORDER — NAPROXEN 500 MG PO TABS: 500.0000 mg | ORAL_TABLET | Freq: Two times a day (BID) | ORAL | 1 refills | Status: DC

## 2017-04-11 MED ORDER — SUCCINYLCHOLINE CHLORIDE 200 MG/10ML IV SOSY
PREFILLED_SYRINGE | INTRAVENOUS | Status: DC | PRN
Start: 2017-04-11 — End: 2017-04-11
  Administered 2017-04-11: 100 mg via INTRAVENOUS

## 2017-04-11 MED ORDER — MAGIC MOUTHWASH
15.0000 mL | Freq: Four times a day (QID) | ORAL | Status: DC | PRN
  Filled 2017-04-11: qty 15

## 2017-04-11 MED ORDER — EPHEDRINE SULFATE 50 MG/ML IJ SOLN
INTRAMUSCULAR | Status: DC | PRN
  Administered 2017-04-11: 5 mg via INTRAVENOUS

## 2017-04-11 MED ORDER — HYDROMORPHONE HCL-NACL 0.5-0.9 MG/ML-% IV SOSY: 0.5000 mg | PREFILLED_SYRINGE | INTRAVENOUS | Status: DC | PRN

## 2017-04-11 MED ORDER — MIDAZOLAM HCL 2 MG/2ML IJ SOLN
INTRAMUSCULAR | Status: AC
  Filled 2017-04-11: qty 2

## 2017-04-11 MED ORDER — TAB-A-VITE/IRON PO TABS
1.0000 | ORAL_TABLET | Freq: Every day | ORAL | Status: DC
  Administered 2017-04-11 – 2017-04-12 (×2): 1 via ORAL
  Filled 2017-04-11 (×2): qty 1

## 2017-04-11 SURGICAL SUPPLY — 46 items
APPLIER CLIP 5 13 M/L LIGAMAX5 (MISCELLANEOUS)
BARRIER SKIN 2 3/4 (OSTOMY) ×2 IMPLANT
BINDER ABDOMINAL 12 ML 46-62 (SOFTGOODS) IMPLANT
CABLE HIGH FREQUENCY MONO STRZ (ELECTRODE) ×2 IMPLANT
CHLORAPREP W/TINT 26ML (MISCELLANEOUS) ×2 IMPLANT
CLIP APPLIE 5 13 M/L LIGAMAX5 (MISCELLANEOUS) IMPLANT
COVER SURGICAL LIGHT HANDLE (MISCELLANEOUS) ×2 IMPLANT
DECANTER SPIKE VIAL GLASS SM (MISCELLANEOUS) ×2 IMPLANT
DEVICE SECURE STRAP 25 ABSORB (INSTRUMENTS) ×2 IMPLANT
DRAPE WARM FLUID 44X44 (DRAPE) ×2 IMPLANT
DRSG TEGADERM 2-3/8X2-3/4 SM (GAUZE/BANDAGES/DRESSINGS) ×6 IMPLANT
DRSG TEGADERM 4X4.75 (GAUZE/BANDAGES/DRESSINGS) ×4 IMPLANT
ELECT REM PT RETURN 15FT ADLT (MISCELLANEOUS) ×2 IMPLANT
GAUZE SPONGE 2X2 8PLY STRL LF (GAUZE/BANDAGES/DRESSINGS) ×2 IMPLANT
GLOVE BIO SURGEON STRL SZ 6.5 (GLOVE) ×4 IMPLANT
GLOVE BIOGEL PI IND STRL 6.5 (GLOVE) ×2 IMPLANT
GLOVE BIOGEL PI IND STRL 7.5 (GLOVE) ×1 IMPLANT
GLOVE BIOGEL PI INDICATOR 6.5 (GLOVE) ×2
GLOVE BIOGEL PI INDICATOR 7.5 (GLOVE) ×1
GLOVE ECLIPSE 8.0 STRL XLNG CF (GLOVE) ×2 IMPLANT
GLOVE INDICATOR 8.0 STRL GRN (GLOVE) ×2 IMPLANT
GLOVE SURG SS PI 7.5 STRL IVOR (GLOVE) ×2 IMPLANT
GOWN STRL REUS W/TWL XL LVL3 (GOWN DISPOSABLE) ×8 IMPLANT
IRRIG SUCT STRYKERFLOW 2 WTIP (MISCELLANEOUS) ×2
IRRIGATION SUCT STRKRFLW 2 WTP (MISCELLANEOUS) ×1 IMPLANT
KIT BASIN OR (CUSTOM PROCEDURE TRAY) ×2 IMPLANT
MARKER SKIN DUAL TIP RULER LAB (MISCELLANEOUS) ×2 IMPLANT
MESH PHASIX RESORB RECT 20X25 (Mesh General) ×2 IMPLANT
MESH VENTRALIGHT ST 8X10 (Mesh General) ×2 IMPLANT
NEEDLE SPNL 22GX3.5 QUINCKE BK (NEEDLE) IMPLANT
PAD POSITIONING PINK XL (MISCELLANEOUS) ×2 IMPLANT
POUCH OSTOMY 2 3/4  H 3804 (WOUND CARE) ×1
POUCH OSTOMY 2 PC DRNBL 2.75 (WOUND CARE) ×1 IMPLANT
SCISSORS LAP 5X35 DISP (ENDOMECHANICALS) ×2 IMPLANT
SHEARS HARMONIC ACE PLUS 36CM (ENDOMECHANICALS) IMPLANT
SLEEVE XCEL OPT CAN 5 100 (ENDOMECHANICALS) ×4 IMPLANT
SPONGE GAUZE 2X2 STER 10/PKG (GAUZE/BANDAGES/DRESSINGS) ×2
STRIP CLOSURE SKIN 1/2X4 (GAUZE/BANDAGES/DRESSINGS) ×4 IMPLANT
SUT MNCRL AB 4-0 PS2 18 (SUTURE) ×2 IMPLANT
SUT PDS AB 1 CT1 27 (SUTURE) ×38 IMPLANT
SUT PROLENE 1 CT 1 30 (SUTURE) ×16 IMPLANT
TOWEL OR 17X26 10 PK STRL BLUE (TOWEL DISPOSABLE) ×2 IMPLANT
TRAY LAPAROSCOPIC (CUSTOM PROCEDURE TRAY) ×2 IMPLANT
TROCAR BLADELESS OPT 5 100 (ENDOMECHANICALS) ×4 IMPLANT
TROCAR XCEL NON-BLD 11X100MML (ENDOMECHANICALS) IMPLANT
TUBING INSUF HEATED (TUBING) ×2 IMPLANT

## 2017-04-11 NOTE — Anesthesia Procedure Notes (Signed)
Procedure Name: Intubation Date/Time: 04/11/2017 12:11 PM Performed by: Dione Booze Pre-anesthesia Checklist: Suction available, Patient being monitored, Emergency Drugs available and Patient identified Patient Re-evaluated:Patient Re-evaluated prior to induction Oxygen Delivery Method: Circle system utilized Preoxygenation: Pre-oxygenation with 100% oxygen Laryngoscope Size: Mac and 4 Grade View: Grade I Tube type: Oral Tube size: 7.5 mm Number of attempts: 1 Airway Equipment and Method: Stylet Placement Confirmation: ETT inserted through vocal cords under direct vision,  positive ETCO2 and breath sounds checked- equal and bilateral Secured at: 21 cm Tube secured with: Tape Dental Injury: Teeth and Oropharynx as per pre-operative assessment

## 2017-04-11 NOTE — Interval H&P Note (Signed)
History and Physical Interval Note:  04/11/2017 11:20 AM  [name]  has presented today for surgery, with the diagnosis of Parastomal hernia at colostomy  The various methods of treatment have been discussed with the patient and family. After consideration of risks, benefits and other options for treatment, the patient has consented to  Procedure(s) with comments: [place] (N/A) - BILATERAL TAP BLOCK INSERTION OF MESH  (N/A) - BILATERAL TAP BLOCK as a surgical intervention .  The patient's history has been reviewed, patient examined, no change in status, stable for surgery.    I have re-reviewed the the patient's records, history, medications, and allergies.  I have re-examined the patient.  I again discussed intraoperative plans and goals of post-operative recovery.  The patient agrees to proceed with lap parastomal hernia repair.  RODERT [name]  Dec 17, 1963 [id number]  Patient Care Team: [name], MD as PCP - General (Family Medicine) Satira Sark, MD as Consulting Physician (Cardiology) Danie Binder, MD as Consulting Physician (Gastroenterology) Milus Banister, MD as Attending Physician (Gastroenterology) Farrel Gobble, MD (Inactive) as Consulting Physician (Medical Oncology) Michael Boston, MD as Consulting Physician (General Surgery) Bjorn Loser, MD as Consulting Physician (Urology) Danie Binder, MD as Consulting Physician (Gastroenterology)  Patient Active Problem List   Diagnosis Date Noted  . Hernia of abdominal wall 01/17/2017  . Presence of left artificial hip joint 11/09/2016  . Osteoarthritis of left hip 11/19/2015  . Status post total replacement of left hip 11/19/2015  . Avascular necrosis of bone of left hip ([place]) 10/06/2015  . Acquired inequality of length of lower legs 10/06/2015  . Colostomy in place [facility])   . Essential hypertension, benign 05/12/2014  . Tachycardia 04/14/2014  .  Thrombocytosis ([place]) 04/14/2014  . Eustachian tube dysfunction 04/07/2014  . Obesity (BMI 30-39.9) 03/24/2014  . Rectal cancer, uT2uN0, s/p LAR/ileostomy 03/20/2014 colostomy [id number] 12/25/2013  . GERD (gastroesophageal reflux disease) 11/27/2013  . Atypical chest pain 11/10/2013  . Tobacco abuse, in remission 11/10/2013    Past Medical History:  Diagnosis Date  . Arthritis   . Aspiration pneumonia ([place]) 03/24/2014  . Colostomy in place [facility])   . Difficulty urinating   . GERD (gastroesophageal reflux disease)   . Hypertension   . Ileostomy present ([place])   . Rectal cancer, uT2uN0, s/p LAR/ileostomy 03/20/2014 colostomy [id number] 12/25/2013   TCS MAY 2015   . Shortening, leg, congenital    LEFT  . Skin irritation    "around ostomy site" -dry and reddened.    Past Surgical History:  Procedure Laterality Date  . COLON RESECTION N/A 03/24/2014   Procedure: Diagnostic  laparotomy, takedown of coloanal anastamosis, end colostomy, with wound vac;  Surgeon: Adin Hector, MD;  [facility];  Service: General;  Laterality: N/A;  . COLONOSCOPY N/A 12/22/2013   SLF: 1. Normal mucosa in the terminal ilium. 2. Moderate diverticulosis in the transverse colon, descending colon, and sigmoid colon. 3. Rectal bleeding due ot rectal mass and hemorrhoids 4. Moderate sized internal hemorrhoids.   . COLONOSCOPY N/A 01/19/2015   Procedure: COLONOSCOPY;  Surgeon: Danie Binder, MD;  [facility];  Service: Endoscopy;  Laterality: N/A;  1215  . COLOSTOMY    . ESOPHAGOGASTRODUODENOSCOPY N/A 12/22/2013   SLF: Probable Candia esophagitis2. Probable Barretts esphagus 3. Mild gastritis.   . EUS N/A 01/01/2014   Procedure: LOWER ENDOSCOPIC ULTRASOUND (EUS);  Surgeon: Milus Banister, MD;  [facility];  Service: Endoscopy;  Laterality: N/A;  . EXAMINATION UNDER ANESTHESIA  08/25/2014   Procedure: EXAM UNDER ANESTHESIA;  Surgeon: Michael Boston, MD;  [facility];  Service: General;;  . eye  reattachment and ear reattachment  Right 1971  . FLEXIBLE SIGMOIDOSCOPY N/A 03/24/2014   Procedure: FLEXIBLE SIGMOIDOSCOPY;  Surgeon: Adin Hector, MD;  [facility];  Service: General;  Laterality: N/A;  . HIP FRACTURE SURGERY Left age 33 on 04-06-1970  . ILEOSTOMY    . ILEOSTOMY CLOSURE N/A 08/25/2014   Procedure: TAKEDOWN OF LOOP ILEOSTOMY ;  Surgeon: Michael Boston, MD;  [facility];  Service: General;  Laterality: N/A;  . TOTAL HIP ARTHROPLASTY Left 11/19/2015   Procedure: LEFT TOTAL HIP ARTHROPLASTY ANTERIOR APPROACH;  Surgeon: Mcarthur Rossetti, MD;  [facility];  Service: Orthopedics;  Laterality: Left;    Social History   Social History  . Marital status: Married    Spouse name: N/A  . Number of children: N/A  . Years of education: N/A   Occupational History  . Not on file.   Social History Main Topics  . Smoking status: Former Smoker    Packs/day: 0.00    Years: 15.00    Types: Cigars    Quit date: 09/10/2011  . Smokeless tobacco: Never Used  . Alcohol use No     Comment: quit years ago  . Drug use: No  . Sexual activity: Not on file   Other Topics Concern  . 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    Family History  Problem Relation Age of Onset  . Colon cancer Sister   . Colon polyps Neg Hx     Current Facility-Administered Medications  Medication Dose Route Frequency Provider Last Rate Last Dose  . bupivacaine liposome (EXPAREL) 1.3 % injection 266 mg  20 mL Infiltration On Call to OR Michael Boston, MD      . ceFAZolin (ANCEF) IVPB 2g/100 mL premix  2 g Intravenous On Call to OR Michael Boston, MD      . Chlorhexidine Gluconate Cloth 2 % PADS 6 each  6 each Topical Once Michael Boston, MD      . clindamycin (CLEOCIN) 900 mg, gentamicin (GARAMYCIN) 240 mg in sodium chloride 0.9 % 1,000 mL for intraperitoneal lavage   Intraperitoneal On Call to OR Michael Boston, MD      . fentaNYL (SUBLIMAZE) 100 MCG/2ML  injection           . fentaNYL (SUBLIMAZE) injection 100 mcg  100 mcg Intravenous Once Connor Palau, MD      . lactated ringers infusion   Intravenous Continuous Connor Palau, MD 75 mL/hr at 04/11/17 1100    . midazolam (VERSED) 2 MG/2ML injection           . midazolam (VERSED) injection 2 mg  2 mg Intravenous Once Connor Palau, MD         No Known Allergies  BP (!) 143/95 (BP Location: Right Arm)   Pulse 66   Temp 98 F (36.7 C) (Oral)   Resp 18   Ht 5\' 5"  (1.651 m)   Wt 85.3 kg (188 lb)   SpO2 96%   BMI 31.28 kg/m   Labs: No results found for this or any previous visit (from the past 48 hour(s)).  Imaging / Studies: No results found.   Adin Hector, M.D., F.A.C.S. Gastrointestinal and Minimally Invasive Surgery [facility]  [address] [place], [place] [address] [phone] Main / Paging  04/11/2017 11:20 AM    Chyane Greer C.

## 2017-04-11 NOTE — Anesthesia Procedure Notes (Signed)
Anesthesia Regional Block: TAP block   Pre-Anesthetic Checklist: ,, timeout performed, Correct Patient, Correct Site, Correct Laterality, Correct Procedure, Correct Position, site marked, Risks and benefits discussed, pre-op evaluation,  At surgeon's request and post-op pain management  Laterality: Right and Left  Prep: Maximum Sterile Barrier Precautions used, chloraprep       Needles:  Injection technique: Single-shot  Needle Type: Echogenic Stimulator Needle     Needle Length: 9cm  Needle Gauge: 21     Additional Needles:   Procedures: ultrasound guided,,,,,,,,  Narrative:  Start time: 04/11/2017 11:35 AM End time: 04/11/2017 11:55 AM Injection made incrementally with aspirations every 5 mL. Anesthesiologist: Roderic Palau  Additional Notes: 2% Lidocaine skin wheel. Bilateral TAP blocks

## 2017-04-11 NOTE — H&P (Signed)
[name]  Location: [facility] Patient #: [id number] DOB: 02/22/1956 Married / Language: English / Race: White Male  Patient Care Team: [name], MD as PCP - General (Family Medicine) Satira Sark, MD as Consulting Physician (Cardiology) Danie Binder, MD as Consulting Physician (Gastroenterology) Milus Banister, MD as Attending Physician (Gastroenterology) Farrel Gobble, MD (Inactive) as Consulting Physician (Medical Oncology) Michael Boston, MD as Consulting Physician (General Surgery) Bjorn Loser, MD as Consulting Physician (Urology) Danie Binder, MD as Consulting Physician (Gastroenterology)   History of Present Illness  The patient is a 61 year old male who presents for an evaluation of a who presents for an evaluation of a hernia.  Chief complaint: Parastomal hernia around colostomy  Pleasant 61 year old male with stage I rectal cancer. Very distal.  Underwent resection LAR with coloanal anatomosis in July 2015.  Developed anastomotic necrosis & leak that required permanent colostomy. Underwent ileostomy takedown in January 2016.  He's been doing well. Some irritation of the peristomal skin in the summer when it was rather sweaty. Occasional leaking. His primary care doctor noticed increased bulging rales colostomy. CT scan confirmed parastomal hernia with small bowel within it. Patient notes that occasionally leak and be irritating. Sent for evaluation to see if he would benefit from parastomal hernia repair. Patient had his left hip replaced. Has been very helpful for him. Walking much better. He has been able to be more active. He cannot do the heavy unrestricted lifting any more. 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  He is ready for surgery now.  No new events.       [name]   04-08-1956 [id number]  Patient Care Team: [name], MD as PCP - General (Family Medicine) Satira Sark, MD as Consulting Physician (Cardiology) Danie Binder, MD as Consulting Physician (Gastroenterology) Milus Banister, MD as Attending Physician (Gastroenterology) Farrel Gobble, MD as Consulting Physician (Medical Oncology) Adin Hector, MD as Consulting Physician (General Surgery)  Procedure (Date: 7/31/202015):  POST-OPERATIVE DIAGNOSIS: VERY LOW RECTAL CANCER  PROCEDURE: ROBOTIC ASSISTED low anterior resection  TRANSANAL TRANSABDOMINAL TME WITH HANDSEWN COLOANAL ANASTOMOSIS  splenic flexure mobilization  diverting loop ileostomy  Surgeon(s):  Adin Hector, MD  Leighton Ruff, MD - Asst  Procedure (Date 03/24/2014):   POST-OPERATIVE DIAGNOSIS Massive ileus  Ischemia/necrosis of distal colon at coloanal anastomosis  PROCEDURE:  FLEXIBLE SIGMOIDOSCOPY  Exploratory laparotomy  Takedown of coloanal anastomosis  Distal colectomy & end colostomy  Wound vac  SURGEON: Surgeon(s):  Adin Hector, MD  ASSISTANT: Servando Snare, RNFA    Diagnosis Colon, segmental resection for tumor, rectosigmoid very low cancer - INVASIVE ADENOCARCINOMA, MODERATELY DIFFERENTIATED. - TUMOR INVADES INTO MUSCULARIS PROPRIA. - RESECTION MARGINS ARE NEGATIVE. - NINE OF NINE LYMPH NODES NEGATIVE FOR CARCINOMA (0/9). - DIVERTICULOSIS. - SEE ONCOLOGY TABLE. Microscopic Comment COLON AND RECTUM (INCLUDING TRANS-ANAL RESECTION): Specimen: Rectosigmoid colon. Procedure: Low anterior resection. Tumor site: Distal 1/3 rectum. Specimen integrity: Intact. Macroscopic intactness of mesorectum: Complete. Macroscopic tumor perforation: Not identified. Invasive tumor: Maximum size: 2.5 cm. Histologic type(s): Invasive adenocarcinoma. Histologic grade and differentiation: G2, moderate differentiated. G1: well differentiated/low grade G2: moderately  differentiated/low grade G3: poorly differentiated/high grade G4: undifferentiated/high grade Type of polyp in which invasive carcinoma arose: N/A. Microscopic extension of invasive tumor: Invades muscularis propria. Lymph-Vascular invasion: Not identified. Peri-neural invasion: Not  identified. Tumor deposit(s) (discontinuous extramural extension): Not identified. Resection margins: Negative. Proximal margin: Negative. Distal margin: Negative. Circumferential (radial) (posterior ascending, posterior descending; lateral 1 of 3 FINAL for [place], Azahel L ([id number]) Microscopic Comment(continued) and posterior mid-rectum; and entire lower 1/3 rectum): Negative. Mesenteric margin (sigmoid and transverse): Negative. Distance closest margin (if all above margins negative): 0.4 cm circumferential. Treatment effect (neo-adjuvant therapy): N/A. Additional polyp(s): N/A. Non-neoplastic findings: Diverticulosis. Lymph nodes: number examined 9; number positive: 0 Pathologic Staging: pT2, pN0, MX Ancillary studies: Biopsy ([id number]) MMR testing showed no microsatellite instability. Can be repeated upon request. Vicente Males MD Pathologist, Electronic Signature (Case signed 03/24/2014) Specimen Annebelle Bostic and Clinical Information Specimen(s) Obtained: Colon, segmental resection for tumor, rectosigmoid very low cancer Specimen Clinical Information Very low rectal cancer Ardeen Fillers) Mccauley Diehl Specimen: Rectosigmoid Specimen integrity: Intact Specimen length: 53 cm Mesorectal intactness: Complete, inked black at time of Dao Memmott Tumor location: Distal 1/3 of rectum Tumor size: There is a 2.5 cm in length, 2 cm width and up to 0.5 cm thick tan pink to hyperemic well defined sessile mass Percent of bowel circumference involved: 15 to 20% Tumor distance to margins Proximal: 48 cm Distal: 2.5 cm Mesenteric (sigmoid and transverse): 9 cm Radial (posterior ascending, posterior descending; lateral and  posterior mid-rectum; and entire lower 1/3 rectum): 3 cm to the perirectal soft tissue margin Macroscopic extent of tumor invasion: The muscularis beneath the mass is focally ill defined, however there is no Demosthenes Virnig complete transection of the muscularis. Total presumed lymph nodes: Found within the fat on initial exam are eight possible lymph nodes ranging from 0.2 to 0.7cm. The fat will be examined for additional lymph nodes following fixation in clearing solution. Extramural satellite tumor nodules: None Mucosal polyp(s): None Additional findings: There are multiple shallow unperforated diverticula scattered throughout the proximal to mid segment. Block summary: A = proximal margin B, C = shave of entire distal margin D-G = mass, entirely submitted H = diverticulum I = four nodes, whole J = four nodes, whole K = two possible lymph nodes found after clearing fat. Total = ten blocks (SSW:kh 03-22-14) 2 of 3 FINAL for [place], [place] [phone]) Report signed out from the following location(s) Technical Component and Interpretation performed at [facility], [place], [place] [address]. CLIA #: [id number], 3 of 3   Problem List/Past Medical Adin Hector, MD; 02/12/2017 4:26 PM) PRIMARY CANCER OF RECTUM (C20)  IMPOTENCE (N52.9)  PARASTOMAL HERNIA WITHOUT OBSTRUCTION OR GANGRENE (K43.5)  STOMATITIS (K12.1)  PREOP - [place] - ENCOUNTER FOR PREOPERATIVE EXAMINATION FOR GENERAL SURGICAL PROCEDURE (W41.324)   Past Surgical History Adin Hector, MD; 02/12/2017 4:26 PM) Colon Removal - Partial  RESECTION, RECTUM, LOW ANTERIOR, ROBOT-ASSISTED ([id number]) 03/20/2014 LOOP ILEOSTOMIES ([id number]) 03/20/2014  Diagnostic Studies History Adin Hector, MD; 02/12/2017 4:26 PM) Colonoscopy  within last year  Allergies Dalbert Mayotte, [place]; 02/12/2017 3:33 PM) No Known Drug Allergies 06/15/2014 Allergies Reconciled   Medication History Dalbert Mayotte,  CMA; 02/12/2017 3:34 PM) Claritin (10MG Capsule, Oral) Active. OxyCODONE HCl (5MG Tablet, Oral) Active. Prostate Health (Oral) Active. Flomax (0.4MG Capsule ER 24HR, Oral) Active. Baby Aspirin (81MG Tablet Chewable, Oral) Active. Multi Vitamin Daily (Oral) Active. Iron (Ferrous Gluconate) (325MG Tablet, Oral) Active. LORazepam (0.5MG Tablet, Oral) Active. Amino Acids ER (Oral) Active. Metoprolol Tartrate (25MG Tablet, Oral) Active. Fish Oil + D3 (1000-1000MG-UNIT Capsule, Oral) Active. PriLOSEC (20MG Capsule DR, Oral) Active. Vitamin C Plus (500MG Tablet, Oral) Active. Medications Reconciled  Social History Adin Hector,  MD; 02/12/2017 4:26 PM) Alcohol use  Occasional alcohol use, Remotely quit alcohol use. Caffeine use  Carbonated beverages, Coffee, Tea. No drug use  Tobacco use  Former smoker.  Family History Adin Hector, MD; 02/12/2017 4:26 PM) Arthritis  Father. Colon Cancer  Sister. Hypertension  Father. Respiratory Condition  Mother.  Other Problems Adin Hector, MD; 02/12/2017 4:26 PM) Arthritis  Bladder Problems  Colon Cancer  Gastroesophageal Reflux Disease  High blood pressure     Review of Systems Adin Hector, MD; 02/12/2017 4:30 PM) General Not Present- Appetite Loss, Chills, Fatigue, Fever, Night Sweats, Weight Gain and Weight Loss. Skin Not Present- Change in Wart/Mole, Dryness, Hives, Jaundice, New Lesions, Non-Healing Wounds, Rash and Ulcer. HEENT Not Present- Earache, Hearing Loss, Hoarseness, Nose Bleed, Oral Ulcers, Ringing in the Ears, Seasonal Allergies, Sinus Pain, Sore Throat, Visual Disturbances, Wears glasses/contact lenses and Yellow Eyes. Respiratory Present- Wheezing. Not Present- Bloody sputum, Chronic Cough, Difficulty Breathing and Snoring. Breast Not Present- Breast Mass, Breast Pain, Nipple Discharge and Skin Changes. Cardiovascular Present- Leg Cramps. Not Present- Chest Pain, Difficulty Breathing  Lying Down, Palpitations, Rapid Heart Rate, Shortness of Breath and Swelling of Extremities. Gastrointestinal Not Present- Abdominal Pain, Bloating, Bloody Stool, Change in Bowel Habits, Chronic diarrhea, Constipation, Difficulty Swallowing, Excessive gas, Gets full quickly at meals, Hemorrhoids, Indigestion, Nausea, Rectal Pain and Vomiting. Male Genitourinary Present- Change in Urinary Stream and Painful Urination. Not Present- Blood in Urine, Frequency, Impotence, Nocturia, Urgency and Urine Leakage.  Vitals Dalbert Mayotte CMA; 02/12/2017 3:34 PM) 02/12/2017 3:34 PM Weight: 190.6 lb Height: 65in Body Surface Area: 1.94 m Body Mass Index: 31.72 kg/m  Temp.: 97.75F  Pulse: 68 (Regular)  BP: 120/80 (Sitting, Left Arm, Standard)   BP (!) 143/95 (BP Location: Right Arm)   Pulse 66   Temp 98 F (36.7 C) (Oral)   Resp 18   Ht '5\' 5"'  (1.651 m)   Wt 85.3 kg (188 lb)   SpO2 96%   BMI 31.28 kg/m      Physical Exam Adin Hector MD; 02/12/2017 4:26 PM) General Mental Status-Alert. General Appearance-Not in acute distress. Voice-Normal.  Integumentary Global Assessment Upon inspection and palpation of skin surfaces of the - Distribution of scalp and body hair is normal. General Characteristics Overall examination of the patient's skin reveals - no rashes and no suspicious lesions.  Head and Neck Head-normocephalic, atraumatic with no lesions or palpable masses. Face Global Assessment - atraumatic, no absence of expression. Neck Global Assessment - no abnormal movements, no decreased range of motion. Trachea-midline. Thyroid Gland Characteristics - non-tender.  Eye Eyeball - Left-Extraocular movements intact, No Nystagmus. Eyeball - Right-Extraocular movements intact, No Nystagmus. Upper Eyelid - Left-No Cyanotic. Upper Eyelid - Right-No Cyanotic.  Chest and Lung Exam Inspection Accessory muscles - No use of accessory muscles in  breathing.  Abdomen Note: Overweight but soft. Right lower quadrant transverse ileotomy closure incision with no hernia. No cellulitis or abscess.   Colostomy left lower quadrant with stool. 10 x 10 cm area of bulging that reduces down to obvious parastomal hernia. Some skin maceration around the colostomy bag but no active leaking today.   Male Genitourinary Note: No inguinal hernias. Normal external genitalia. Epididymi, testes, and spermatic cords normal without any masses.   Rectal Note: Deferred   Peripheral Vascular Upper Extremity Inspection - Left - Not Gangrenous, No Petechiae. Right - Not Gangrenous, No Petechiae.  Neurologic Neurologic evaluation reveals -normal attention span and ability to concentrate, able to name  objects and repeat phrases. Appropriate fund of knowledge and normal coordination.  Neuropsychiatric Mental status exam performed with findings of-able to articulate well with normal speech/language, rate, volume and coherence and no evidence of hallucinations, delusions, obsessions or homicidal/suicidal ideation. Orientation-oriented X3.  Musculoskeletal Global Assessment Gait and Station - normal gait and station.  Lymphatic General Lymphatics Description - No Generalized lymphadenopathy.    Assessment & Plan  PARASTOMAL HERNIA WITHOUT OBSTRUCTION OR GANGRENE (K43.5) Impression: Larger but reducible parastomal hernia. Now has small intestine within it. Now having issues of difficulty pouching.  I think he would benefit from repair of his parastomal hernia. We'll plan laparoscopic underlay repair. Most likely biologic Sugarbaker-type with Phasix biologic mesh around primary repair to minimize recurrence & erosion. Given his obesity and physical activity, more permanent mesh reinforcement under that even.  He would like to proceed at some point. 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 Would like to wait for a couple weeks.  He is ready now.  PREOP - [place] - ENCOUNTER FOR PREOPERATIVE EXAMINATION FOR GENERAL SURGICAL PROCEDURE (Z01.818) Current Plans You are being scheduled for surgery- Our schedulers will call you.  You should hear from our office's scheduling department within 5 working days about the location, date, and time of surgery. We try to make accommodations for patient's preferences in scheduling surgery, but sometimes the OR schedule or the surgeon's schedule prevents [place] from making those accommodations.  If you have not heard from our office [phone]) in 5 working days, call the office and ask for your surgeon's nurse.  If you have other questions about your diagnosis, plan, or surgery, call the office and ask for your surgeon's nurse.  Written instructions provided The anatomy & physiology of the abdominal wall was discussed. The pathophysiology of hernias was discussed. Natural history risks without surgery including progeressive enlargement, pain, incarceration, & strangulation was discussed. Contributors to complications such as smoking, obesity, diabetes, prior surgery, etc were discussed.  I feel the risks of no intervention will lead to serious problems that outweigh the operative risks; therefore, I recommended surgery to reduce and repair the hernia. I explained laparoscopic techniques with possible need for an open approach. I noted the probable use of mesh to patch and/or buttress the hernia repair  Risks such as bleeding, infection, abscess, need for further treatment, heart attack, death, and other risks were discussed. I noted a good likelihood this will help address the problem. Goals of post-operative recovery were discussed as well. Possibility that this will not correct all  symptoms was explained. I stressed the importance of low-impact activity, aggressive pain control, avoiding constipation, & not pushing through pain to minimize risk of post-operative chronic pain or injury. Possibility of reherniation especially with smoking, obesity, diabetes, immunosuppression, and other health conditions was discussed. We will work to minimize complications.  An educational handout further explaining the pathology & treatment options was given as well. Questions were answered. The patient expresses understanding & wishes to proceed with surgery.  Pt Education - [facility] Hernia Post-Op HCI (Irven Ingalsbe): discussed with patient and provided information. Pt Education - [facility] Pain Control (Keoni Risinger) Pt Education - Pamphlet Given - Laparoscopic Hernia Repair: discussed with patient and provided information. PRIMARY CANCER OF RECTUM (C20) Impression: No  evidence of cancer recurrence at this time.  Risk of recurrence is relatively low. They feel reassured.  No evidence of anemia with iron supplement. Consider transitioning just to daily multivitamin. Current Plans Return to clinic as needed.  Soreness, decreased appetite, and poor energy level are common problems after surgery. While many people can struggle with a bad day, these concerns should gradually fade away or at least improve. Much of your recovery depends on your health & the severity of your operation. Please call if you have any further questions / concerns related to surgery.  Increase activity as tolerated to regular everyday activity. Consider daily low impact exercise every day such as walking an hour a day.  Do not push through pain. If it hurts to do it, then don't do it.  Diet as tolerated. Low fat high fiber diet ideal. 30 g fiber a day ideal. Consider taking a daily fiber supplement to keep your bowels regular.  Followup with your primary care physician for other health issues as would normally be  done.  Consider screening for malignancies (breast, prostate, colon, melanoma, etc) as appropriate. Discuss with you primary care physician.  Follow up if no improvement or if symptoms worsen  Adin Hector, M.D., F.A.C.S. Gastrointestinal and Minimally Invasive Surgery [facility] [address], [place], [place] [id number] [phone] Main / Paging

## 2017-04-11 NOTE — Transfer of Care (Signed)
Immediate Anesthesia Transfer of Care Note  Patient: [name]  Procedure(s) Performed: Procedure(s) with comments: LAPAROSCOPIC INCARCERATED PERISTOMAL AND INCARCERATED INCISIONAL HERNIA X2 (N/A) - BILATERAL TAP BLOCK INSERTION OF MESH  (N/A) - BILATERAL TAP BLOCK  Patient Location: PACU  Anesthesia Type:General  Level of Consciousness:  sedated, patient cooperative and responds to stimulation  Airway & Oxygen Therapy:Patient Spontanous Breathing and Patient connected to face mask oxgen  Post-op Assessment:  Report given to PACU RN and Post -op Vital signs reviewed and stable  Post vital signs:  Reviewed and stable  Last Vitals:  Vitals:   04/11/17 1155 04/11/17 1626  BP: (!) 137/91 (!) 150/98  Pulse: (!) 59 93  Resp: 13 20  Temp:  (P) 36.8 C  SpO2: 21% 224%    Complications: No apparent anesthesia complications

## 2017-04-11 NOTE — Anesthesia Preprocedure Evaluation (Addendum)
Anesthesia Evaluation  Patient identified by MRN, date of birth, ID band Patient awake    Reviewed: Allergy & Precautions, H&P , NPO status , Patient's Chart, lab work & pertinent test results  Airway Mallampati: II  TM Distance: >3 FB Neck ROM: Full    Dental no notable dental hx. (+) Poor Dentition, Dental Advisory Given   Pulmonary neg pulmonary ROS, former smoker,    Pulmonary exam normal breath sounds clear to auscultation       Cardiovascular hypertension, Pt. on medications  Rhythm:Regular Rate:Normal     Neuro/Psych negative neurological ROS  negative psych ROS   GI/Hepatic Neg liver ROS, GERD  Medicated and Controlled,  Endo/Other  negative endocrine ROS  Renal/GU negative Renal ROS  negative genitourinary   Musculoskeletal  (+) Arthritis , Osteoarthritis,    Abdominal   Peds  Hematology negative hematology ROS (+)   Anesthesia Other Findings   Reproductive/Obstetrics negative OB ROS                            Anesthesia Physical Anesthesia Plan  ASA: II  Anesthesia Plan: General   Post-op Pain Management:  Regional for Post-op pain   Induction: Intravenous  PONV Risk Score and Plan: 3 and Ondansetron, Dexamethasone and Midazolam  Airway Management Planned: Oral ETT  Additional Equipment:   Intra-op Plan:   Post-operative Plan: Extubation in OR  Informed Consent: I have reviewed the patients History and Physical, chart, labs and discussed the procedure including the risks, benefits and alternatives for the proposed anesthesia with the patient or authorized representative who has indicated his/her understanding and acceptance.   Dental advisory given  Plan Discussed with: CRNA  Anesthesia Plan Comments:        Anesthesia Quick Evaluation

## 2017-04-11 NOTE — Discharge Instructions (Signed)
[facility] REPAIR: POST OP INSTRUCTIONS  ######################################################################  EAT Gradually transition to a high fiber diet with a fiber supplement over the next few weeks after discharge.  Start with a pureed / full liquid diet (see below)  WALK Walk an hour a day.  Control your pain to do that.    CONTROL PAIN Control pain so that you can walk, sleep, tolerate sneezing/coughing, go up/down stairs.  HAVE A BOWEL MOVEMENT DAILY Keep your bowels regular to avoid problems.  OK to try a laxative to override constipation.  OK to use an antidairrheal to slow down diarrhea.  Call if not better after 2 tries  CALL IF YOU HAVE PROBLEMS/CONCERNS Call if you are still struggling despite following these instructions. Call if you have concerns not answered by these instructions  ######################################################################    1. DIET: Follow a light bland diet the first 24 hours after arrival home, such as soup, liquids, crackers, etc.  Be sure to include lots of fluids daily.  Avoid fast food or heavy meals as your are more likely to get nauseated.  Eat a low fat the next few days after surgery. 2. Take your usually prescribed home medications unless otherwise directed. 3. PAIN CONTROL: a. Pain is best controlled by a usual combination of three different methods TOGETHER: i. Ice/Heat ii. Over the counter pain medication iii. Prescription pain medication b. Most patients will experience some swelling and bruising around the hernia(s) such as the bellybutton, groins, or old incisions.  Ice packs or heating pads (30-60 minutes up to 6 times a day) will help. Use ice for the first few days to help decrease swelling and bruising, then switch to heat to help relax tight/sore spots and speed recovery.  Some people prefer to use ice alone, heat alone, alternating between ice & heat.  Experiment to what works for you.  Swelling and bruising can take  several weeks to resolve.   c. It is helpful to take an over-the-counter pain medication regularly for the first few weeks.  Choose one of the following that works best for you: i. Naproxen (Aleve, etc)  Two 235m tabs twice a day ii. Ibuprofen (Advil, etc) Three 2036mtabs four times a day (every meal & bedtime) iii. Acetaminophen (Tylenol, etc) 325-65046mour times a day (every meal & bedtime) d. A  prescription for pain medication should be given to you upon discharge.  Take your pain medication as prescribed.  i. If you are having problems/concerns with the prescription medicine (does not control pain, nausea, vomiting, rash, itching, etc), please call us [phone] see if we need to switch you to a different pain medicine that will work better for you and/or control your side effect better. ii. If you need a refill on your pain medication, please contact your pharmacy.  They will contact our office to request authorization. Prescriptions will not be filled after 5 pm or on week-ends. 4. Avoid getting constipated.  Between the surgery and the pain medications, it is common to experience some constipation.  Increasing fluid intake and taking a fiber supplement (such as Metamucil, Citrucel, FiberCon, MiraLax, etc) 1-2 times a day regularly will usually help prevent this problem from occurring.  A mild laxative (prune juice, Milk of Magnesia, MiraLax, etc) should be taken according to package directions if there are no bowel movements after 48 hours.   5. Wash / shower every day.  You may shower over the dressings as they are waterproof.   6. Remove  your waterproof bandages 5 days after surgery.  You may leave the incision open to air.  You may replace a dressing/Band-Aid to cover the incision for comfort if you wish.  Continue to shower over incision(s) after the dressing is off.    7. ACTIVITIES as tolerated:   a. You may resume regular (light) daily activities beginning the next day--such  as daily self-care, walking, climbing stairs--gradually increasing activities as tolerated.  If you can walk 30 minutes without difficulty, it is safe to try more intense activity such as jogging, treadmill, bicycling, low-impact aerobics, swimming, etc. b. Save the most intensive and strenuous activity for last such as sit-ups, heavy lifting, contact sports, etc  Refrain from any heavy lifting or straining until you are off narcotics for pain control.   c. DO NOT PUSH THROUGH PAIN.  Let pain be your guide: If it hurts to do something, don't do it.  Pain is your body warning you to avoid that activity for another week until the pain goes down. d. You may drive when you are no longer taking prescription pain medication, you can comfortably wear a seatbelt, and you can safely maneuver your car and apply brakes. e. Dennis Bast may have sexual intercourse when it is comfortable.  8. FOLLOW UP in our office a. Please call [facility] at [phone] to set up an appointment to see your surgeon in the office for a follow-up appointment approximately 2-3 weeks after your surgery. b. Make sure that you call for this appointment the day you arrive home to insure a convenient appointment time. 9.  IF YOU HAVE DISABILITY OR FAMILY LEAVE FORMS, BRING THEM TO THE OFFICE FOR PROCESSING.  DO NOT GIVE THEM TO YOUR DOCTOR.  WHEN TO CALL us [phone]: 1. Poor pain control 2. Reactions / problems with new medications (rash/itching, nausea, etc)  3. Fever over 101.5 F (38.5 C) 4. Inability to urinate 5. Nausea and/or vomiting 6. Worsening swelling or bruising 7. Continued bleeding from incision. 8. Increased pain, redness, or drainage from the incision   The clinic staff is available to answer your questions during regular business hours (8:30am-5pm).  Please dont hesitate to call and ask to speak to one of our nurses for clinical concerns.   If you have a medical emergency, go to the nearest emergency room or call  911.  A surgeon from [facility] is always on call at the hospitals in [facility], [place], [place], [place], [place]  [address] ?  [address] [address], [place], [place]   [address] MAIN: [phone] ? TOLL FREE: [phone] ? FAX: [phone] [email or web address]  Ostomy Support Information  Theresia Majors heard that people get along just fine with only one of their eyes, or one of their lungs, or one of their kidneys. But you also know that you have only one intestine and only one bladder, and that leaves you feeling awfully empty, both physically and emotionally: You think no other people go around without part of their intestine with the ends of their intestines sticking out through their abdominal walls.   YOU ARE NOT ALONE.  There are nearly three quarters of a million people in the [place] who have an ostomy; people who have had surgery to remove all or part of their colons or bladders.   There is even a national association, the [place] [employer] [place] with over 350 local affiliated support groups that are organized by volunteers who  provide peer support and counseling. Juan Quam has a toll free telephone num-ber, ([phone] and an educational, interactive website, www.ostomy.org   An ostomy is an opening in the belly (abdominal wall) made by surgery. Ostomates are people who have had this procedure. The opening (stoma) allows the kidney or bowel to discharge waste. An external pouch covers the stoma to collect waste. Pouches are are a simple bag and are odor free. Different companies have disposable or reusable pouches to fit one's lifestyle. An ostomy can either be temporary or permanent.   THERE ARE THREE MAIN TYPES OF OSTOMIES  Colostomy. A colostomy is a surgically created opening in the large intestine (colon).  Ileostomy. An ileostomy is a surgically created opening in the small intestine.  Urostomy. A urostomy is a  surgically created opening to divert urine away from the bladder.  OSTOMY Care  The following guidelines will make care of your colostomy easier. Keep this information close by for quick reference.  Helpful DIET hints Eat a well-balanced diet including vegetables and fresh fruits. Eat on a regular schedule. Drink at least 6 to 8 glasses of fluids daily. Eat slowly in a relaxed atmosphere. Chew your food thoroughly. Avoid chewing gum, smoking, and drinking from a straw. This will help decrease the amount of air you swallow, which may help reduce gas. Eating yogurt or drinking buttermilk may help reduce gas.  To control gas at night, do not eat after 8 p.m. This will give your bowel time to quiet down before you go to bed.  If gas is a problem, you can purchase Beano. Sprinkle Beano on the first bite of food before eating to reduce gas. It has no flavor and should not change the taste of your food. You can buy Beano over the counter at your local drugstore.  Foods like fish, onions, garlic, broccoli, asparagus, and cabbage produce odor. Although your pouch is odor-proof, if you eat these foods you may notice a stronger odor when emptying your pouch. If this is a concern, you may want to limit these foods in your diet.  If you have an ileostomy, you will have chronic diarrhea & need to drink more liquids to avoid getting dehydrated.  Consider antidiarrheal medicine like imodium (loperamide) or Lomotil to help slow down bowel movements / diarrhea into your ileostomy bag.  GETTING TO GOOD BOWEL HEALTH WITH AN ILEOSTOMY . Irregular bowel habits such as constipation and diarrhea can lead to many problems over time.  The goal: 3-6 small BOWEL MOVEMENTS A DAY!  To have soft, regular bowel movements:   Drink plenty of fluids, consider 4-6 tall glasses of water a day.    Controlling diarrheA  o Switch to liquids and simpler foods for a few days to avoid stressing your intestines further. o Avoid  dairy products (especially milk & ice cream) for a short time.  The intestines often can lose the ability to digest lactose when stressed. o Avoid foods that cause gassiness or bloating.  Typical foods include beans and other legumes, cabbage, broccoli, and dairy foods.  Every person has some sensitivity to other foods, so listen to our body and avoid those foods that trigger problems for you. o Adding fiber (Citrucel, Metamucil, psyllium, Miralax) gradually can help thicken stools by absorbing excess fluid and retrain the intestines to act more normally.  Slowly increase the dose over a few weeks.  Too much fiber too soon can backfire and cause cramping & bloating. o Probiotics (such as active yogurt,  Align, etc) may help repopulate the intestines and colon with normal bacteria and calm down a sensitive digestive tract.  Most studies show it to be of mild help, though, and such products can be costly. o Medicines: - Bismuth subsalicylate (ex. Kayopectate, Pepto Bismol) every 30 minutes for up to 6 doses can help control diarrhea.  Avoid if pregnant. - Loperamide (Immodium) can slow down diarrhea.  Start with two tablets (48m total) first and then try one tablet every 6 hours.  Avoid if you are having fevers or severe pain.  If you are not better or start feeling worse, stop all medicines and call your doctor for advice o Call your doctor if you are getting worse or not better.  Sometimes further testing (cultures, endoscopy, X-ray studies, bloodwork, etc) may be needed to help diagnose and treat the cause of the diarrhea.  TROUBLESHOOTING IRREGULAR BOWELS 1) Avoid extremes of bowel movements (no bad constipation/diarrhea) 2) Miralax 17gm mixed in 8oz. water or juice-daily. May use twice a day as needed  3) Gas-x,Phazyme, etc. as needed for gas & bloating.  4) Soft,bland diet. No spicy,greasy,fried foods.  5) Prilosec (omeprazole) over-the-counter as needed  6) May hold gluten/wheat products from diet  to see if symptoms improve.  7)  May try probiotics (Align, Activa, etc) to help calm the bowels down 7) If symptoms become worse call back immediately.   Applying the pouching system To apply your pouch, follow these steps:  Place all your equipment close at hand before removing your pouch.  Wash your hands.  Stand or sit in front of a mirror. Use the position that works best for you. Remember that you must keep the skin around the stoma wrinkle-free for a good seal.  Gently remove the used pouch (1-piece system) or the pouch and old wafer (2-piece system). Empty the pouch into the toilet. Save the closure clip to use again.  Wash the stoma itself and the skin around the stoma. Your stoma may bleed a little when being washed. This is normal. Rinse and pat dry. You may use a wash cloth or soft paper towels (like Bounty), mild soap (like Dial, Safeguard, or [place], and water. Avoid soaps that contain perfumes or lotions.  For a new pouch (1-piece system) or a new wafer (2-piece system), measure your stoma using the stoma guide in each box of supplies.  Trace the shape of your stoma onto the back of the new pouch or the back of the new wafer. Cut out the opening. Remove the paper backing and set it aside.  Optional: Apply a skin barrier powder to surrounding skin if it is irritated (bare or weeping), and dust off the excess. Optional: Apply a skin-prep wipe (such as Skin Prep or All-Kare) to the skin around the stoma, and let it dry. Do not apply this solution if the skin is irritated (red, tender, or broken) or if you have shaved around the stoma. Optional: Apply a skin barrier paste (such as Stomahesive, Coloplast, or Premium) around the opening cut in the back of the pouch or wafer. Allow it to dry for 30 to 60 seconds.  Hold the pouch (1-piece system) or wafer (2-piece system) with the sticky side toward your body. Make sure the skin around the stoma is wrinkle-free. Center the opening on  the stoma, then press firmly to your abdomen (Fig. 4). Look in the mirror to check if you are placing the pouch, or wafer, in the right position. For a  2-piece system, snap the pouch onto the wafer. Make sure it snaps into place securely.  Place your hand over the stoma and the pouch or wafer for about 30 seconds. The heat from your hand can help the pouch or wafer stick to your skin.  Add deodorant (such as Super Banish or Nullo) to your pouch. Other options include food extracts such as vanilla oil and peppermint extract. Add about 10 drops of the deodorant to the pouch. Then apply the closure clamp. Note: Do not use toxic  chemicals or commercial cleaning agents in your pouch. These substances may harm the stoma.  Optional: For extra seal, apply tape to all 4 sides around the pouch or wafer, as if you were framing a picture. You may use any brand of medical adhesive tape. Change your pouch every 5 to 7 days. Change it immediately if a leak occurs.  Wash your hands afterwards.  If you are wearing a 2-piece system, you may use 2 new pouches per week and alternate them. Rinse the pouch with mild soap and warm water and hang it to dry for the next day. Apply the fresh pouch. Alternate the 2 pouches like this for a week. After a week, change the wafer and begin with 2 new pouches. Place the old pouches in a plastic bag, and put them in the trash.    Tips for colostomy care  Applying Your Pouch You may stand or sit to apply your pouch.  Keep the skin where you apply the pouch wrinkle-free. If the skin around the pouch is wrinkled, the seal may break when your skin stretches.  If hair grows close to your stoma, you may trim off the hair with scissors, an electric razor, or a safety razor.  Always have a mirror nearby so you can get a better view of your stoma.  When you apply a new pouch, write the date on the adhesive tape. This will remind you of when you last changed your pouch.  Changing  Your Pouch The best time to change your pouch is in the morning, before eating or drinking anything. Your stoma can function at any time, but it will function more after eating or drinking.  Emptying Your Pouch Empty your pouch when it is one-third full (of urine, stool, and/or gas). If you wait until your pouch is fuller than this, it will be more difficult to empty and more noticeable. When you empty your pouch, either put toilet paper in the toilet bowl first, or flush the toilet while you empty the pouch. This will reduce splashing. You can empty the pouch between your legs or to one side while sitting, or while standing or stooping. If you have a 2-piece system, you can snap off the pouch to empty it. Remember that your stoma may function during this time. If you wish to rinse your pouch after you empty it, a [place] baster can be helpful. When using a baster, squirt water up into the pouch through the opening at the bottom. With a 2-piece system, you can snap off the pouch to rinse it. After rinsing  your pouch, empty it into the toilet. When rinsing your pouch at home, put a few granules of Dreft soap in the rinse water. This helps lubricate and freshen your pouch. The inside of your pouch can be sprayed with non-stick cooking oil (Pam spray). This may help reduce stool sticking to the inside of the pouch.  Bathing You may shower or bathe  with your pouch on or off. Remember that your stoma may function during this time.  The materials you use to wash your stoma and the skin around it should be clean, but they do not need to be sterile.  Wearing Your Pouch During hot weather, or if you perspire a lot in general, wear a cover over your pouch. This may prevent a rash on your skin under the pouch. Pouch covers are sold at ostomy supply stores. Wear the pouch inside your underwear for better support. Watch your weight. Any gain or loss of 10 to 15 pounds or more can change the way your pouch  fits.  Going Away From Home A collapsible cup (like those that come in travel kits) or a soft plastic squirt bottle with a pull-up top (like a travel bottle for shampoo) can be used for rinsing your pouch when you are away from home. Tilt the opening of the pouch at an upward angle when using a cup to rinse.  Carry wet wipes or extra tissues to use in public bathrooms.  Carry an extra pouching system with you at all times.  Never keep ostomy supplies in the glove compartment of your car. Extreme heat or cold can damage the skin barriers and adhesive wafers on the pouch.  When you travel, carry your ostomy supplies with you at all times. Keep them within easy reach. Do not pack ostomy supplies in baggage that will be checked or otherwise separated from you, because your baggage might be lost. If youre traveling out of the country, it is helpful to have a letter stating that you are carrying ostomy supplies as a medical necessity.  If you need ostomy supplies while traveling, look in the yellow pages of the telephone book under Surgical Supplies. Or call the local ostomy organization to find out where supplies are available.  Do not let your ostomy supplies get low. Always order new pouches before you use the last one.  Reducing Odor Limit foods such as broccoli, cabbage, onions, fish, and garlic in your diet to help reduce odor. Each time you empty your pouch, carefully clean the opening of the pouch, both inside and outside, with toilet paper. Rinse your pouch 1 or 2 times daily after you empty it (see directions for emptying your pouch and going away from home). Add deodorant (such as Super Banish or Nullo) to your pouch. Use air deodorizers in your bathroom. Do not add aspirin to your pouch. Even though aspirin can help prevent odor, it could cause ulcers on your stoma.  When to call the doctor Call the doctor if you have any of the following symptoms: Purple, black, or white  stoma Severe cramps lasting more than 6 hours Severe watery discharge from the stoma lasting more than 6 hours No output from the colostomy for 3 days Excessive bleeding from your stoma Swelling of your stoma to more than 1/2-inch larger than usual Pulling inward of your stoma below skin level Severe skin irritation or deep ulcers Bulging or other changes in your abdomen  When to call your ostomy nurse Call your ostomy/enterostomal therapy (ET) nurse if any of the following occurs: Frequent leaking of your pouching system Change in size or appearance of your stoma, causing discomfort or problems with your pouch Skin rash or rawness Weight gain or loss that causes problems with your pouch      FREQUENTLY ASKED QUESTIONS   Why havent you met any of these folks who have an  ostomy?  Well, maybe you have! You just did not recognize them because an ostomy doesn't show. It can be kept secret if you wish. Why, maybe some of your best friends, office associates or neighbors have an ostomy ... you never can tell.   People facing ostomy surgery have many quality-of-life questions like:  Will you bulge? Smell? Make noises? Will you feel waste leaving your body? Will you be a captive of the toilet? Will you starve? Be a social outcast? Get/stay married? Have babies? Easily bathe, go swimming, bend over?  OK, lets look at what you can expect:   Will you bulge?  Remember, without part of the intestine or bladder, and its contents, you should have a flatter tummy than before. You can expect to wear, with little exception, what you wore before surgery ... and this in-cludes tight clothing and bathing suits.   Will you smell?  Today, thanks to modern odor proof pouching systems, you can walk into an ostomy support group meeting and not smell anything that is foul or offensive. And, for those with an ileostomy or colostomy who are concerned about odor when emptying their pouch, there are in-pouch  deodorants that can be used to eliminate any waste odors that may exist.   Will you make noises?  Everyone produces gas, especially if they are an air-swallower. But intestinal sounds that occur from time to time are no differ-ent than a gurgling tummy, and quite often your clothing will muffle any sounds.   Will you feel the waste discharges?  For those with a colostomy or ileostomy there might be a slight pressure when waste leaves your body, but understand that the intestines have no nerve endings, so there will be no unpleasant sensations. Those with a urostomy will probably be unaware of any kidney drainage.   Will you be a captive of the toilet?  Immediately post-op you will spend more time in the bathroom than you will after your body recovers from surgery. Every person is different, but on average those with an ileostomy or urostomy may empty their pouches 4 to 6 times a day; a little  less if you have a colostomy. The average wear time between pouch system changes is 3 to 5 days and the changing process should take less than 30 minutes.   Will I need to be on a special diet? Most people return to their normal diet when they have recovered from surgery. Be sure to chew your food well, eat a well-balanced diet and drink plenty of fluids. If you experience problems with a certain food, wait a couple of weeks and try it again.  Will there be odor and noises? Pouching systems are designed to be odor-proof or odor-resistant. There are deodorants that can be used in the pouch. Medications are also available to help reduce odor. Limit gas-producing foods and carbonated beverages. You will experience less gas and fewer noises as you heal from surgery.  How much time will it take to care for my ostomy? At first, you may spend a lot of time learning about your ostomy and how to take care of it. As you become more comfortable and skilled at changing the pouching system, it will take very little time  to care for it.   Will I be able to return to work? People with ostomies can perform most jobs. As soon as you have healed from surgery, you should be able to return to work. Heavy lifting (more than 10  pounds) may be discouraged.   What about intimacy? Sexual relationships and intimacy are important and fulfilling aspects of your life. They should continue after ostomy surgery. Intimacy-related concerns should be discussed openly between you and your partner.   Can I wear regular clothing? You do not need to wear special clothing. Ostomy pouches are fairly flat and barely noticeable. Elastic undergarments will not hurt the stoma or prevent the ostomy from functioning.   Can I participate in sports? An ostomy should not limit your involvement in sports. Many people with ostomies are runners, skiers, swimmers or participate in other active lifestyles. Talk with your caregiver first before doing heavy physical activity.  Will you starve?  Not if you follow doctors orders at each stage of your post-op adjustment. There is no such thing as an ostomy diet. Some people with an ostomy will be able to eat and tolerate anything; others may find diffi-culty with some foods. Each person is an individual and must determine, by trial, what is best for them. A good practice for all is to drink plenty of water.   Will you be a social outcast?  Have you met anyone who has an ostomy and is a social outcast? Why should you be the first? Only your attitude and self image will effect how you are treated. No confi-dent person is an Occupational psychologist.     PROFESSIONAL HELP  Resources are available if you need help or have questions about your ostomy.    Specially trained nurses called Wound, Ostomy Continence Nurses (WOCN) are available for consultation in most major medical centers.   Consider getting an ostomy consult with Cena Benton at [facility] to help troubleshoot stoma pouch fittings and other  issues with your ostomy: [phone]   The [employer] Ostomy Association (UOA) is a group made up of many local chapters throughout the [place]. These local groups hold meetings and provide support to prospective and existing ostomates. They sponsor educational events and have qualified visitors to make personal or telephone visits. Contact the UOA for the chapter nearest you and for other educational publications.   More detailed information can be found in Colostomy Guide, a publication of the [employer] (UOA). Contact UOA at 1-(819) 438-0183 or visit their web site at https://arellano.com/. The website contains links to other sites, suppliers and resources.   Tree [employer] Start Services:  Start at the website to enlist for support.  Your Wound Ostomy (WOCN) nurse may have started this process. https://[email or web address].[email or web address]/securestart  Secure Start services are designed to support people as they live their lives with an ostomy or neurogenic bladder. Enrolling is easy and at no cost to the patient. We realize that each person's needs and life journey are different. Through Secure Start services, we want to help people live their life, their way.

## 2017-04-11 NOTE — Anesthesia Postprocedure Evaluation (Signed)
Anesthesia Post Note  Patient: [name]  Procedure(s) Performed: Procedure(s) (LRB): LAPAROSCOPIC INCARCERATED PERISTOMAL AND INCARCERATED INCISIONAL HERNIA X2 (N/A) INSERTION OF MESH  (N/A)     Patient location during evaluation: PACU Anesthesia Type: General and Regional Level of consciousness: awake and alert Pain management: pain level controlled Vital Signs Assessment: post-procedure vital signs reviewed and stable Respiratory status: spontaneous breathing, nonlabored ventilation, respiratory function stable and patient connected to nasal cannula oxygen Cardiovascular status: blood pressure returned to baseline and stable Postop Assessment: no signs of nausea or vomiting Anesthetic complications: no    Last Vitals:  Vitals:   04/11/17 1715 04/11/17 1730  BP: (!) 134/95 131/89  Pulse: 83 77  Resp: 19 13  Temp:    SpO2: 97% 97%    Last Pain:  Vitals:   04/11/17 1009  TempSrc: Oral                 [name],W. EDMOND

## 2017-04-11 NOTE — Consult Note (Signed)
[place] Nurse ostomy consult note Stoma type/location: LLQ, end colostomy (from 2015) Patient is in short stay to have hernia surgery repair today. He has had some issues with peristomal tissue exuberance which he has had treated in the past by [facility] nurses with silver nitrate  Stomal assessment/size: 1 3/4 round, flush with skin Peristomal assessment: area of tissue exuberance noted from 3-9 o'clock Patient reports they bleed very easily, and alot  Treatment options for stomal/peristomal skin: silver nitrate applied x 6 sticks, tissue darkened/grey Output none in his pouch at the time of my assessment and treatment today Ostomy pouching: 2pc. 2 1/4 placed  Education provided:  Patient has requested that we assist him with obtaining a hernia support. I have printed [facility] [facility] hernia support belt information and measuring guide and answered questions about this product for the patient.   Patient wife is provided with Scientist, clinical (histocompatibility and immunogenetics).   Re consult if needed, will not follow at this time. Thanks  Shahana Capes [employer], RN,CWOCN, CNS, [place] [phone])

## 2017-04-11 NOTE — Progress Notes (Signed)
AssistedDr. Oren Bracket with right, left, ultrasound guided, transabdominal plane blocks. Side rails up, monitors on throughout procedure. See vital signs in flow sheet. Tolerated Procedure well.

## 2017-04-11 NOTE — Op Note (Addendum)
04/11/2017  PATIENT:  [name]  61 y.o. male  Patient Care Team: [name], MD as PCP - General (Family Medicine) Satira Sark, MD as Consulting Physician (Cardiology) Danie Binder, MD as Consulting Physician (Gastroenterology) Milus Banister, MD as Attending Physician (Gastroenterology) Farrel Gobble, MD (Inactive) as Consulting Physician (Medical Oncology) Michael Boston, MD as Consulting Physician (General Surgery) Bjorn Loser, MD as Consulting Physician (Urology) Danie Binder, MD as Consulting Physician (Gastroenterology)  PRE-OPERATIVE DIAGNOSIS:  Parastomal hernia at colostomy  POST-OPERATIVE DIAGNOSIS:    Incarcerted Parastomal hernia at Colostomy Incarcerated Incisional  Hernias x 2  PROCEDURE:  Procedure(s): LAPAROSCOPIC REPAIR OF INCARCERATED PERISTOMAL AND INCARCERATED INCISIONAL HERNIAS INSERTION OF MESH   SURGEON:  Adin Hector, MD  ASSISTANT: none   ANESTHESIA:     General Bilateral TAP block Local anesthesia field block: (0.25% bupivacaine & liposomal  Bupivacaine [Experel])  EBL:  Total I/O In: 1000 [I.V.:1000] Out: 50 [Blood:50]  Per anesthesia record  Delay start of Pharmacological VTE agent (>24hrs) due to surgical blood loss or risk of bleeding:  no  DRAINS: none   SPECIMEN:  No Specimen  DISPOSITION OF SPECIMEN:  N/A  COUNTS:  YES  PLAN OF CARE: Admit for overnight observation  PATIENT DISPOSITION:  PACU - hemodynamically stable.  INDICATION: Pleasant patient has developed a ventral wall abdominal hernia at his permanent colostomy  Recommendation was made for surgical repair:  The anatomy & physiology of the abdominal wall was discussed. The pathophysiology of hernias was discussed. Natural history risks without surgery including progeressive enlargement, pain, incarceration & strangulation was discussed. Contributors to complications such as smoking, obesity, diabetes, prior surgery, etc were  discussed.  I feel the risks of no intervention will lead to serious problems that outweigh the operative risks; therefore, I recommended surgery to reduce and repair the hernia. I explained laparoscopic techniques with possible need for an open approach. I noted the probable use of mesh to patch and/or buttress the hernia repair  Risks such as bleeding, infection, abscess, need for further treatment, heart attack, death, and other risks were discussed. I noted a good likelihood this will help address the problem. Goals of post-operative recovery were discussed as well. Possibility that this will not correct all symptoms was explained. I stressed the importance of low-impact activity, aggressive pain control, avoiding constipation, & not pushing through pain to minimize risk of post-operative chronic pain or injury. Possibility of reherniation especially with smoking, obesity, diabetes, immunosuppression, and other health conditions was discussed. We will work to minimize complications.  An educational handout further explaining the pathology & treatment options was given as well. Questions were answered. The patient expresses understanding & wishes to proceed with surgery.   OR FINDINGS: Moderate-sized parastomal hernia with loop of jejunum chronically incarcerated laterally.  Periumbilical incisional hernia incarcerated with omentum and a knuckle of small bowel.  Right lateral incisional hernia incarcerated with omentum at old loop ileostomy site  Type of repair: Laparoscopic underlay repair   Placement of mesh: Intraperitoneal underlay repair  Name of mesh: 1. AROUND COLOSTOMY:  Phasix Mesh (a knitted monofilament mesh scaffold using Poly-4-hydroxybutyrate (P4HB), a biologically derived, fully resorbable material)   2.  Around incisional hernias and under Phasix covering colostomy:  Bard Ventralight dual sided mesh.  Size of mesh: 25x20cm of Phasix.  25 x 20 cm of Bard mesh  Orientation:  Oblique Phasix.  Transverse for the Bard mesh  Mesh overlap:  5-7cm   DESCRIPTION:  Informed consent was confirmed. The patient underwent general anaesthesia without difficulty. The patient was positioned appropriately. VTE prevention in place. The patient's abdomen was clipped, prepped, & draped in a sterile fashion. Surgical timeout confirmed our plan.  The patient was positioned in reverse Trendelenburg. Abdominal entry was gained using optical entry technique in the left upper abdomen. Entry was clean. I induced carbon dioxide insufflation. Camera inspection revealed no injury. Extra ports were carefully placed under direct laparoscopic visualization.   I could see adhesions on the parietal peritoneum under the abdominal wall.  I did laparoscopic lysis of adhesions to expose the entire anterior abdominal wall.  I primarily used focused sharp dissection.  This took some time as he had many loops of small bowel and greater omentum.  She freed off and reduced hernias and small bowel off the old loop ileostomy in the right midabdomen, and greater omentum and some small bowel around a periumbilical incisional hernias well.  Eventually I got to the colostomy.  Became apparent that there was a loop of jejunum chronically incarcerated up in the colostomy and parastomal hernia laterally.  I carefully freed adhesions to the colon and eventually was able to reduce and get up into the hernia sac and free off the small bowel reduce it down.  Did further dissection around the colostomy up into the giant hernia sac to help straighten it out and remove any redundancy.  I did careful lyse adhesions at the descending and transverse colon.  I freed some greater omentum off as well.  This allowed me to have good length so that the colon could, onto the anterior abdominal wall up towards the left upper quadrant to the costal ridge.  Husted lab rest Lyse adhesions to release some dense interloop small bowel adhesions and  greater omental adhesions.  Freddrick March off so that there is no small bowel on the anterior abdominal wall or flanks.  No evidence of any transition point or obstruction.  Inspection to confirm no evidence of any serosal injury or other abnormalities in the abdomen.  I made sure hemostasis was good.    I mapped out the region using a needle passer.   To ensure that I would have at least 5 cm radial coverage outside of the hernia defect, I chose a 25x20cm Phasix mesh to lay directly on the part with the colon.  Ideally I would have had a slightly smaller size, but the next size down was way too small.  I placed #1 PDS stitches around its edge about every 5 cm = 12 total.  I rolled the mesh & placed into the peritoneal cavity through the periumbilical hernia defect.  I unrolled the mesh and positioned it appropriately.  I primarily closed the parastomal hernia laterally using #1 PDS interrupted sutures 4 using a laparoscopic suture passer under direct visualization.  I allowed the fascial defect colostomy to be much more snug.  I then secured the mesh to cover up the hernia defect using a laparoscopic suture passer to pass the tails of the PDS through the abdominal wall & tagged them with clamps for good transfascial suturing.  I started out in four corners to make sure I had the mesh centered under the hernia defect appropriately, and then proceeded to work in quadrants.  I made sure that the colon laid obliquely towards the left upper quadrant ridge.  I left a gap at the most superior lateral aspect allowed the colon to exit laterally for a classic Sugarbaker-type repair.  I did some extra stitch years around the fascia of the colostomy defect.  This led the mesh to hang like a good hemic snug around the sausage-shaped colon.  Desufflated and tied the sutures down.  Because the patient also hernias at the right midabdomen and periumbilical region, I also placed a 25 x 20 cm Bard mesh.  I used Prolene sutures around  it 12 sutures.  I brought that in through the right paramedian fascial hernia defect.  Scattered up with suture passers as well.  Made sure there is overlap.  I made sure the permanent mesh laid a little more transversely to cover up the incisional hernias while still providing excellent coverage around the colostomy for a reinforce Sugarbaker repair with permanent mesh, yet avoiding permanent mesh directly in contact with the colon    We evacuated CO2 & desufflated the abdomen.  I tied the fascial stitches down. I closed the fascial defect that I placed the mesh through using #1 PDS interrupted transverse stitches primarily at the periumbilical and right paramedian hernias.  I reinsufflated the abdomen. The meshs provided at least circumferential coverage around the entire region of hernia defects.  I secured the mesh centrally with an additional trans fascial stitch in & out the mesh using #1 PDS under laparoscopic visualization.   I tacked the edges & central part of the mesh to the peritoneum/posterior rectus fascia with SecureStrap absorbable tacks.   I did reinspection. Hemostasis was good. Mesh laid well. I completed a broad field block of local anesthesia at fascial stitch sites & fascial closure areas.    Capnoperitoneum was evacuated. Ports were removed. The skin was closed with Monocryl at the port sites and Steri-Strips on the fascial stitch puncture sites.  Patient is being extubated to go to the recovery room.  I discussed operative findings, updated the patient's status, discussed probable steps to recovery, and gave postoperative recommendations to the patient's family.  Recommendations were made.  Questions were answered.  They expressed understanding & appreciation.  Adin Hector, M.D., F.A.C.S. Gastrointestinal and Minimally Invasive Surgery [facility] [address], [place], [place] [id number] [phone] Main / Paging  04/11/2017 4:18 PM

## 2017-04-12 ENCOUNTER — Encounter (HOSPITAL_COMMUNITY): Payer: Self-pay | Admitting: Surgery

## 2017-04-12 NOTE — Discharge Summary (Signed)
Physician Discharge Summary  Patient ID: [name] MRN: [id number] DOB/AGE: 60/14/1957  61 y.o.  Admit date: 04/11/2017 Discharge date: 04/12/2017   Patient Care Team: [name], MD as PCP - General (Family Medicine) Satira Sark, MD as Consulting Physician (Cardiology) Danie Binder, MD as Consulting Physician (Gastroenterology) Milus Banister, MD as Attending Physician (Gastroenterology) Farrel Gobble, MD (Inactive) as Consulting Physician (Medical Oncology) Michael Boston, MD as Consulting Physician (General Surgery) Bjorn Loser, MD as Consulting Physician (Urology) Danie Binder, MD as Consulting Physician (Gastroenterology)  Discharge Diagnoses:  Principal Problem:   Incarcerated parastomal hernia s/p lap repair with mesh 04/11/2017 Active Problems:   Rectal cancer, uT2uN0, s/p LAR/ileostomy 03/20/2014 colostomy [id number]   Obesity (BMI 30-39.9)   Essential hypertension, benign   Colostomy in place [facility])   Avascular necrosis of bone of left hip (HCC)   Acquired inequality of length of lower legs   Presence of left artificial hip joint   Incarcerated incisional hernia s/p lap repair with mesh 04/11/2017   POST-OPERATIVE DIAGNOSIS:    Incarcerted Parastomal hernia at Colostomy Incarcerated Incisional  Hernias x 2  PROCEDURE:  Procedure(s): LAPAROSCOPIC REPAIR OF INCARCERATED PERISTOMAL AND INCARCERATED INCISIONAL HERNIAS INSERTION OF MESH   SURGEON:  Adin Hector, MD SURGEON:    Surgeon(s): Michael Boston, MD  Consults: None  Hospital Course:   The patient underwent the surgery above.  Postoperatively, the patient gradually mobilized and advanced to a solid diet.  Pain and other symptoms were treated aggressively.    By the time of discharge, the patient was walking well the hallways, eating food, having flatus.  Pain was well-controlled on an oral medications.  Based on meeting discharge criteria and continuing to recover,  I felt it was safe for the patient to be discharged from the hospital to further recover with close followup. Postoperative recommendations were discussed in detail.  They are written as well.  Discharged Condition: good  Disposition:  Follow-up Information    Schedule an appointment as soon as possible for a visit in 3 weeks to follow up.   Why:  To follow up after your operation, To follow up after your hospital stay       Michael Boston, MD.   Specialty:  General Surgery Contact information: [place] [id number] [phone]        Follow up In 3 weeks.   Why:  To follow up after your operation, To follow up after your hospital stay          06-Home-Health Care Svc  Discharge Instructions    Call MD for:    Complete by:  As directed    FEVER > 101.5 F  (temperatures < 101.5 F are not significant)   Call MD for:  extreme fatigue    Complete by:  As directed    Call MD for:  persistant dizziness or light-headedness    Complete by:  As directed    Call MD for:  persistant nausea and vomiting    Complete by:  As directed    Call MD for:  redness, tenderness, or signs of infection (pain, swelling, redness, odor or green/yellow discharge around incision site)    Complete by:  As directed    Call MD for:  severe uncontrolled pain    Complete by:  As directed    Diet - low sodium heart healthy    Complete by:  As directed    Follow  a light diet the first few days at home.   Start with a bland diet such as soups, liquids, starchy foods, low fat foods, etc.   If you feel full, bloated, or constipated, stay on a full liquid or pureed/blenderized diet for a few days until you feel better and no longer constipated. Be sure to drink plenty of fluids every day to avoid getting dehydrated (feeling dizzy, not urinating, etc.). Gradually add a fiber supplement to your diet   Discharge instructions    Complete by:  As directed    See Discharge  Instructions If you are not getting better after two weeks or are noticing you are getting worse, contact our office [phone] for further advice.  We may need to adjust your medications, re-evaluate you in the office, send you to the emergency room, or see what other things we can do to help. The clinic staff is available to answer your questions during regular business hours (8:30am-5pm).  Please don't hesitate to call and ask to speak to one of our nurses for clinical concerns.    A surgeon from [name] is always on call at the hospitals 24 hours/day If you have a medical emergency, go to the nearest emergency room or call 911.   Driving Restrictions    Complete by:  As directed    You may drive when you are no longer taking narcotic prescription pain medication, you can comfortably wear a seatbelt, and you can safely make sudden turns/stops to protect yourself without hesitating due to pain.   Increase activity slowly    Complete by:  As directed    Start light daily activities --- self-care, walking, climbing stairs- beginning the day after surgery.  Gradually increase activities as tolerated.  Control your pain to be active.  Stop when you are tired.  Ideally, walk several times a day, eventually an hour a day.   Most people are back to most day-to-day activities in a few weeks.  It takes 4-8 weeks to get back to unrestricted, intense activity. If you can walk 30 minutes without difficulty, it is safe to try more intense activity such as jogging, treadmill, bicycling, low-impact aerobics, swimming, etc. Save the most intensive and strenuous activity for last (Usually 4-8 weeks after surgery) such as sit-ups, heavy lifting, contact sports, etc.  Refrain from any intense heavy lifting or straining until you are off narcotics for pain control.  You will have off days, but things should improve week-by-week. DO NOT PUSH THROUGH PAIN.  Let pain be your guide: If it hurts to do  something, don't do it.  Pain is your body warning you to avoid that activity for another week until the pain goes down.   Lifting restrictions    Complete by:  As directed    If you can walk 30 minutes without difficulty, it is safe to try more intense activity such as jogging, treadmill, bicycling, low-impact aerobics, swimming, etc. Save the most intensive and strenuous activity for last (Usually 4-8 weeks after surgery) such as sit-ups, heavy lifting, contact sports, etc.  Refrain from any intense heavy lifting or straining until you are off narcotics for pain control.  You will have off days, but things should improve week-by-week. DO NOT PUSH THROUGH PAIN.  Let pain be your guide: If it hurts to do something, don't do it.  Pain is your body warning you to avoid that activity for another week until the pain goes down.  May walk up steps    Complete by:  As directed    No wound care    Complete by:  As directed    It is good for closed incision and even open wounds to be washed every day.  Shower every day.  Short baths are fine.  Wash the incisions and wounds clean with soap & water.    If you have a closed incision(s), wash the incision with soap & water every day.  You may leave closed incisions open to air if it is dry.   You may cover the incision with clean gauze & replace it after your daily shower for comfort. If you have skin tapes (Steristrips) or skin glue (Dermabond) on your incision, leave them in place.  They will fall off on their own like a scab.  You may trim any edges that curl up with clean scissors.  If you have staples, set up an appointment for them to be removed in the office in 10 days after surgery.  If you have a drain, wash around the skin exit site with soap & water and place a new dressing of gauze or band aid around the skin every day.  Keep the drain site clean & dry.   Sexual Activity Restrictions    Complete by:  As directed    You may have sexual intercourse  when it is comfortable. If it hurts to do something, stop.      Allergies as of 04/12/2017   No Known Allergies     Medication List    TAKE these medications   aspirin EC 81 MG tablet Take 81 mg by mouth daily.   DANDELION ROOT PO Take 1 tablet by mouth 2 (two) times daily as needed (TO PREVENT CANCER).   Fish Oil 1000 MG Caps Take 1,000 mg by mouth daily.   IRON PO Take 1 tablet by mouth every morning.   LUTEIN PO Take 1 tablet by mouth 2 (two) times daily.   methocarbamol 750 MG tablet Commonly known as:  ROBAXIN Take 1 tablet (750 mg total) by mouth 4 (four) times daily as needed (use for muscle cramps/pain).   metoprolol tartrate 25 MG tablet Commonly known as:  LOPRESSOR Take 1 tablet (25 mg total) by mouth 2 (two) times daily.   naproxen 500 MG tablet Commonly known as:  NAPROSYN Take 1 tablet (500 mg total) by mouth 2 (two) times daily with a meal.   omeprazole 20 MG capsule Commonly known as:  PRILOSEC TAKE 1 CAPSULE(20 MG) BY MOUTH TWICE DAILY   ONE DAILY FOR MEN 50+ ADVANCED PO Take 1 tablet by mouth daily.   oxyCODONE 5 MG immediate release tablet Commonly known as:  Oxy IR/ROXICODONE Take 1-2 tablets (5-10 mg total) by mouth every 4 (four) hours as needed for moderate pain, severe pain or breakthrough pain.   saw palmetto 500 MG capsule Take 500 mg by mouth 2 (two) times daily.   Turmeric 500 MG Caps Take 500 mg by mouth 2 (two) times daily.   vitamin C 500 MG tablet Commonly known as:  ASCORBIC ACID Take 500 mg by mouth 2 (two) times daily.            Discharge Care Instructions        Start     Ordered   04/11/17 0000  Discharge instructions    Comments:  See Discharge Instructions If you are not getting better after two weeks or are noticing you  are getting worse, contact our office [phone] for further advice.  We may need to adjust your medications, re-evaluate you in the office, send you to the emergency room, or see what  other things we can do to help. The clinic staff is available to answer your questions during regular business hours (8:30am-5pm).  Please don't hesitate to call and ask to speak to one of our nurses for clinical concerns.    A surgeon from [facility] is always on call at the hospitals 24 hours/day If you have a medical emergency, go to the nearest emergency room or call 911.   04/11/17 1120   04/11/17 0000  Diet - low sodium heart healthy    Comments:  Follow a light diet the first few days at home.   Start with a bland diet such as soups, liquids, starchy foods, low fat foods, etc.   If you feel full, bloated, or constipated, stay on a full liquid or pureed/blenderized diet for a few days until you feel better and no longer constipated. Be sure to drink plenty of fluids every day to avoid getting dehydrated (feeling dizzy, not urinating, etc.). Gradually add a fiber supplement to your diet   04/11/17 1120   04/11/17 0000  Increase activity slowly    Comments:  Start light daily activities --- self-care, walking, climbing stairs- beginning the day after surgery.  Gradually increase activities as tolerated.  Control your pain to be active.  Stop when you are tired.  Ideally, walk several times a day, eventually an hour a day.   Most people are back to most day-to-day activities in a few weeks.  It takes 4-8 weeks to get back to unrestricted, intense activity. If you can walk 30 minutes without difficulty, it is safe to try more intense activity such as jogging, treadmill, bicycling, low-impact aerobics, swimming, etc. Save the most intensive and strenuous activity for last (Usually 4-8 weeks after surgery) such as sit-ups, heavy lifting, contact sports, etc.  Refrain from any intense heavy lifting or straining until you are off narcotics for pain control.  You will have off days, but things should improve week-by-week. DO NOT PUSH THROUGH PAIN.  Let pain be your guide: If it hurts to  do something, don't do it.  Pain is your body warning you to avoid that activity for another week until the pain goes down.   04/11/17 1120   04/11/17 0000  May walk up steps     04/11/17 1120   04/11/17 0000  Driving Restrictions    Comments:  You may drive when you are no longer taking narcotic prescription pain medication, you can comfortably wear a seatbelt, and you can safely make sudden turns/stops to protect yourself without hesitating due to pain.   04/11/17 1120   04/11/17 0000  Lifting restrictions    Comments:  If you can walk 30 minutes without difficulty, it is safe to try more intense activity such as jogging, treadmill, bicycling, low-impact aerobics, swimming, etc. Save the most intensive and strenuous activity for last (Usually 4-8 weeks after surgery) such as sit-ups, heavy lifting, contact sports, etc.  Refrain from any intense heavy lifting or straining until you are off narcotics for pain control.  You will have off days, but things should improve week-by-week. DO NOT PUSH THROUGH PAIN.  Let pain be your guide: If it hurts to do something, don't do it.  Pain is your body warning you to avoid that activity for another  week until the pain goes down.   04/11/17 1120   04/11/17 0000  Sexual Activity Restrictions    Comments:  You may have sexual intercourse when it is comfortable. If it hurts to do something, stop.   04/11/17 1120   04/11/17 0000  No wound care    Comments:  It is good for closed incision and even open wounds to be washed every day.  Shower every day.  Short baths are fine.  Wash the incisions and wounds clean with soap & water.    If you have a closed incision(s), wash the incision with soap & water every day.  You may leave closed incisions open to air if it is dry.   You may cover the incision with clean gauze & replace it after your daily shower for comfort. If you have skin tapes (Steristrips) or skin glue (Dermabond) on your incision, leave them in place.   They will fall off on their own like a scab.  You may trim any edges that curl up with clean scissors.  If you have staples, set up an appointment for them to be removed in the office in 10 days after surgery.  If you have a drain, wash around the skin exit site with soap & water and place a new dressing of gauze or band aid around the skin every day.  Keep the drain site clean & dry.   04/11/17 1120   04/11/17 0000  Call MD for:    Comments:  FEVER > 101.5 F  (temperatures < 101.5 F are not significant)   04/11/17 1120   04/11/17 0000  Call MD for:  persistant nausea and vomiting     04/11/17 1120   04/11/17 0000  Call MD for:  severe uncontrolled pain     04/11/17 1120   04/11/17 0000  Call MD for:  redness, tenderness, or signs of infection (pain, swelling, redness, odor or green/yellow discharge around incision site)     04/11/17 1120   04/11/17 0000  Call MD for:  persistant dizziness or light-headedness     04/11/17 1120   04/11/17 0000  Call MD for:  extreme fatigue     04/11/17 1120   04/11/17 0000  methocarbamol (ROBAXIN) 750 MG tablet  4 times daily PRN     04/11/17 1120   04/11/17 0000  oxyCODONE (OXY IR/ROXICODONE) 5 MG immediate release tablet  Every 4 hours PRN     04/11/17 1120   04/11/17 0000  naproxen (NAPROSYN) 500 MG tablet  2 times daily with meals     04/11/17 1120      Significant Diagnostic Studies:  No results found for this or any previous visit (from the past 72 hour(s)).  No results found.  Discharge Exam: Blood pressure (!) 133/93, pulse 68, temperature 98.2 F (36.8 C), temperature source Oral, resp. rate 18, height 5\' 5"  (1.651 m), weight 85.3 kg (188 lb), SpO2 97 %.  General: Pt awake/alert/oriented x4 in No acute distress Eyes: PERRL, normal EOM.  Sclera clear.  No icterus Neuro: CN II-XII intact w/o focal sensory/motor deficits. Lymph: No head/neck/groin lymphadenopathy Psych:  No delerium/psychosis/paranoia HENT: Normocephalic, Mucus  membranes moist.  No thrush Neck: Supple, No tracheal deviation Chest: No chest wall pain w good excursion CV:  Pulses intact.  Regular rhythm MS: Normal AROM mjr joints.  No obvious deformity Abdomen: Soft.  Mildly distended.  Mildly tender at incisions only.  No evidence of peritonitis.  No incarcerated hernias.  Ext:  SCDs BLE.  No mjr edema.  No cyanosis Skin: No petechiae / purpura  Past Medical History:  Diagnosis Date  . Arthritis   . Aspiration pneumonia ([place]) 03/24/2014  . Colostomy in place [facility])   . Difficulty urinating   . GERD (gastroesophageal reflux disease)   . Hypertension   . Ileostomy present ([place])   . Rectal cancer, uT2uN0, s/p LAR/ileostomy 03/20/2014 colostomy Aug2015 12/25/2013   TCS MAY 2015   . Shortening, leg, congenital    LEFT  . Skin irritation    "around ostomy site" -dry and reddened.  . Tachycardia 04/14/2014    Past Surgical History:  Procedure Laterality Date  . COLON RESECTION N/A 03/24/2014   Procedure: Diagnostic  laparotomy, takedown of coloanal anastamosis, end colostomy, with wound vac;  Surgeon: Adin Hector, MD;  Location: [facility] ORS;  Service: General;  Laterality: N/A;  . COLONOSCOPY N/A 12/22/2013   SLF: 1. Normal mucosa in the terminal ilium. 2. Moderate diverticulosis in the transverse colon, descending colon, and sigmoid colon. 3. Rectal bleeding due ot rectal mass and hemorrhoids 4. Moderate sized internal hemorrhoids.   . COLONOSCOPY N/A 01/19/2015   Procedure: COLONOSCOPY;  Surgeon: Danie Binder, MD;  Location: [facility];  Service: Endoscopy;  Laterality: N/A;  1215  . COLOSTOMY    . ESOPHAGOGASTRODUODENOSCOPY N/A 12/22/2013   SLF: Probable Candia esophagitis2. Probable Barretts esphagus 3. Mild gastritis.   . EUS N/A 01/01/2014   Procedure: LOWER ENDOSCOPIC ULTRASOUND (EUS);  Surgeon: Milus Banister, MD;  Location: [facility];  Service: Endoscopy;  Laterality: N/A;  . EXAMINATION UNDER ANESTHESIA  08/25/2014   Procedure: EXAM UNDER  ANESTHESIA;  Surgeon: Michael Boston, MD;  Location: [facility];  Service: General;;  . eye reattachment and ear reattachment  Right 1971  . FLEXIBLE SIGMOIDOSCOPY N/A 03/24/2014   Procedure: FLEXIBLE SIGMOIDOSCOPY;  Surgeon: Adin Hector, MD;  Location: [facility];  Service: General;  Laterality: N/A;  . HIP FRACTURE SURGERY Left age 54 on 04-06-1970  . ILEOSTOMY    . ILEOSTOMY CLOSURE N/A 08/25/2014   Procedure: TAKEDOWN OF LOOP ILEOSTOMY ;  Surgeon: Michael Boston, MD;  Location: [facility];  Service: General;  Laterality: N/A;  . TOTAL HIP ARTHROPLASTY Left 11/19/2015   Procedure: LEFT TOTAL HIP ARTHROPLASTY ANTERIOR APPROACH;  Surgeon: Mcarthur Rossetti, MD;  Location: [facility];  Service: Orthopedics;  Laterality: Left;    Social History   Social History  . Marital status: Married    Spouse name: N/A  . Number of children: N/A  . Years of education: N/A   Occupational History  . Not on file.   Social History Main Topics  . Smoking status: Former Smoker    Packs/day: 0.00    Years: 15.00    Types: Cigars    Quit date: 09/10/2011  . Smokeless tobacco: Never Used  . Alcohol use No     Comment: quit years ago  . Drug use: No  . Sexual activity: Not on file   Other Topics Concern  . 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    Family History  Problem Relation Age of Onset  . Colon cancer Sister   . Colon polyps Neg Hx     Current Facility-Administered Medications  Medication Dose Route Frequency Provider Last Rate Last Dose  . 0.9 %  sodium chloride infusion   Intravenous Continuous Michael Boston, MD 50 mL/hr at 04/12/17 0600    .  0.9 %  sodium chloride infusion  250 mL Intravenous PRN Michael Boston, MD      . acetaminophen (TYLENOL) tablet 1,000 mg  1,000 mg Oral Tor Netters, MD   1,000 mg at 04/12/17 0600  . alum & mag hydroxide-simeth (MAALOX/MYLANTA) 200-200-20 MG/5ML suspension 30 mL  30 mL Oral Q6H PRN Michael Boston, MD      .  aspirin EC tablet 81 mg  81 mg Oral Daily Michael Boston, MD   81 mg at 04/12/17 1047  . bisacodyl (DULCOLAX) suppository 10 mg  10 mg Rectal Daily PRN Michael Boston, MD      . diazepam (VALIUM) tablet 5 mg  5 mg Oral Q6H PRN Michael Boston, MD      . diphenhydrAMINE (BENADRYL) 12.5 MG/5ML elixir 12.5 mg  12.5 mg Oral Q6H PRN Michael Boston, MD       Or  . diphenhydrAMINE (BENADRYL) injection 12.5 mg  12.5 mg Intravenous Q6H PRN Michael Boston, MD      . enoxaparin (LOVENOX) injection 40 mg  40 mg Subcutaneous Q24H Michael Boston, MD   40 mg at 04/12/17 8119  . feeding supplement (ENSURE SURGERY) liquid 237 mL  237 mL Oral BID BM Michael Boston, MD   237 mL at 04/12/17 1048  . gabapentin (NEURONTIN) capsule 300 mg  300 mg Oral BID Michael Boston, MD   300 mg at 04/12/17 1046  . guaiFENesin-dextromethorphan (ROBITUSSIN DM) 100-10 MG/5ML syrup 10 mL  10 mL Oral Q4H PRN Michael Boston, MD      . hydrALAZINE (APRESOLINE) injection 5-20 mg  5-20 mg Intravenous Q4H PRN Michael Boston, MD      . hydrocortisone (ANUSOL-HC) 2.5 % rectal cream 1 application  1 application Topical QID PRN Michael Boston, MD      . hydrocortisone cream 1 % 1 application  1 application Topical TID PRN Michael Boston, MD      . HYDROmorphone (DILAUDID) injection 0.5-2 mg  0.5-2 mg Intravenous Q2H PRN Michael Boston, MD      . lactated ringers infusion 1,000 mL  1,000 mL Intravenous Q8H PRN Marcell Pfeifer, Remo Lipps, MD      . lip balm (CARMEX) ointment 1 application  1 application Topical BID Michael Boston, MD   1 application at 14/78/29 1048  . magic mouthwash  15 mL Oral QID PRN Michael Boston, MD      . menthol-cetylpyridinium (CEPACOL) lozenge 3 mg  1 lozenge Oral PRN Michael Boston, MD      . methocarbamol (ROBAXIN) tablet 1,000 mg  1,000 mg Oral Q6H PRN Michael Boston, MD      . metoprolol tartrate (LOPRESSOR) tablet 25 mg  25 mg Oral BID Michael Boston, MD   25 mg at 04/12/17 1046  . multivitamins with iron tablet 1 tablet  1 tablet Oral Daily  Michael Boston, MD   1 tablet at 04/12/17 1046  . ondansetron (ZOFRAN-ODT) disintegrating tablet 4 mg  4 mg Oral Q6H PRN Michael Boston, MD       Or  . ondansetron [facility]) injection 4 mg  4 mg Intravenous Q6H PRN Michael Boston, MD      . oxyCODONE (Oxy IR/ROXICODONE) immediate release tablet 5-10 mg  5-10 mg Oral Q4H PRN Michael Boston, MD   5 mg at 04/12/17 0814  . pantoprazole (PROTONIX) EC tablet 80 mg  80 mg Oral Daily Michael Boston, MD   80 mg at 04/12/17 1046  . phenol (CHLORASEPTIC) mouth spray 1-2 spray  1-2 spray Mouth/Throat  PRN Michael Boston, MD      . polyethylene glycol (MIRALAX / GLYCOLAX) packet 17 g  17 g Oral Q12H PRN Michael Boston, MD      . prochlorperazine (COMPAZINE) injection 5-10 mg  5-10 mg Intravenous Q4H PRN Michael Boston, MD      . simethicone (MYLICON) chewable tablet 40 mg  40 mg Oral Q6H PRN Michael Boston, MD      . sodium chloride flush (NS) 0.9 % injection 3 mL  3 mL Intravenous Gorden Harms, MD      . sodium chloride flush (NS) 0.9 % injection 3 mL  3 mL Intravenous PRN Michael Boston, MD      . vitamin C (ASCORBIC ACID) tablet 500 mg  500 mg Oral BID Michael Boston, MD   500 mg at 04/12/17 1047     No Known Allergies  Signed: Morton Peters, M.D., F.A.C.S. [facility] [address] [place], [place] [address] [phone] Main / Paging   04/12/2017, 12:17 PM

## 2017-04-12 NOTE — Consult Note (Signed)
[facility] Nurse ostomy follow up  Stoma type/location: LLQ, end colostomy (from 2015) Stomal assessment/size: 1 3/4 round, flush with skin Output none in his pouch at the time of my assessment and treatment today Ostomy pouching: 2pc. 2 1/4 placed  Education provided:  Patient is fitted in an ostomy support belt today.  I have ordered an abdominal binder and they will take that home if they need additional support and understand to cut an opening for the ostomy.  To be discharged today.  Will not follow at this time.  Please re-consult if needed.  Domenic Moras RN BSN [place] Pager [phone]

## 2017-07-01 ENCOUNTER — Other Ambulatory Visit: Payer: Self-pay | Admitting: Family Medicine

## 2017-08-15 ENCOUNTER — Other Ambulatory Visit: Payer: Self-pay

## 2017-08-15 ENCOUNTER — Other Ambulatory Visit: Payer: Self-pay | Admitting: Family Medicine

## 2017-08-15 MED ORDER — METOPROLOL TARTRATE 25 MG PO TABS: 25.0000 mg | ORAL_TABLET | Freq: Two times a day (BID) | ORAL | 0 refills | Status: DC

## 2017-08-17 ENCOUNTER — Other Ambulatory Visit: Payer: Self-pay | Admitting: Family Medicine

## 2017-08-22 ENCOUNTER — Other Ambulatory Visit: Payer: Self-pay | Admitting: Family Medicine

## 2017-08-30 DIAGNOSIS — Z933 Colostomy status: Secondary | ICD-10-CM | POA: Diagnosis not present

## 2017-08-30 DIAGNOSIS — Z6833 Body mass index (BMI) 33.0-33.9, adult: Secondary | ICD-10-CM | POA: Diagnosis not present

## 2017-08-30 DIAGNOSIS — Z809 Family history of malignant neoplasm, unspecified: Secondary | ICD-10-CM | POA: Diagnosis not present

## 2017-08-30 DIAGNOSIS — Z823 Family history of stroke: Secondary | ICD-10-CM | POA: Diagnosis not present

## 2017-08-30 DIAGNOSIS — K219 Gastro-esophageal reflux disease without esophagitis: Secondary | ICD-10-CM | POA: Diagnosis not present

## 2017-08-30 DIAGNOSIS — Z7722 Contact with and (suspected) exposure to environmental tobacco smoke (acute) (chronic): Secondary | ICD-10-CM | POA: Diagnosis not present

## 2017-08-30 DIAGNOSIS — M1990 Unspecified osteoarthritis, unspecified site: Secondary | ICD-10-CM | POA: Diagnosis not present

## 2017-08-30 DIAGNOSIS — Z791 Long term (current) use of non-steroidal anti-inflammatories (NSAID): Secondary | ICD-10-CM | POA: Diagnosis not present

## 2017-08-30 DIAGNOSIS — E669 Obesity, unspecified: Secondary | ICD-10-CM | POA: Diagnosis not present

## 2017-08-30 DIAGNOSIS — I10 Essential (primary) hypertension: Secondary | ICD-10-CM | POA: Diagnosis not present

## 2017-09-14 ENCOUNTER — Ambulatory Visit (INDEPENDENT_AMBULATORY_CARE_PROVIDER_SITE_OTHER): Payer: Medicare HMO | Admitting: Family Medicine

## 2017-09-14 ENCOUNTER — Encounter: Payer: Self-pay | Admitting: Family Medicine

## 2017-09-14 VITALS — BP 136/84 | Ht 62.0 in | Wt 199.1 lb

## 2017-09-14 DIAGNOSIS — I10 Essential (primary) hypertension: Secondary | ICD-10-CM

## 2017-09-14 DIAGNOSIS — Z23 Encounter for immunization: Secondary | ICD-10-CM

## 2017-09-14 DIAGNOSIS — Z0000 Encounter for general adult medical examination without abnormal findings: Secondary | ICD-10-CM | POA: Diagnosis not present

## 2017-09-14 MED ORDER — METOPROLOL TARTRATE 25 MG PO TABS: 25.0000 mg | ORAL_TABLET | Freq: Two times a day (BID) | ORAL | 5 refills | Status: DC

## 2017-09-14 NOTE — Progress Notes (Signed)
   Subjective:    Patient ID: [name], male    DOB: 1956/07/06, 62 y.o.   MRN: [id number]  HPI  The patient comes in today for a wellness visit.    A review of their health history was completed.  A review of medications was also completed.  Any needed refills; Yes  Eating habits: Good  Falls/  MVA accidents in past few months: none  Regular exercise: Yes  Specialist pt sees on regular basis: No  Preventative health issues were discussed.   Additional concerns: Hands are cramping. Blood pressure medicine and blood pressure levels reviewed today with patient. Compliant with blood pressure medicine. States does not miss a dose. No obvious side effects. Blood pressure generally good when checked elsewhere. Watching salt intake.     Review of Systems  Constitutional: Negative for activity change, appetite change and fever.  HENT: Negative for congestion and rhinorrhea.   Eyes: Negative for discharge.  Respiratory: Negative for cough and wheezing.   Cardiovascular: Negative for chest pain.  Gastrointestinal: Negative for abdominal pain, blood in stool and vomiting.  Genitourinary: Negative for difficulty urinating and frequency.  Musculoskeletal: Negative for neck pain.  Skin: Negative for rash.  Allergic/Immunologic: Negative for environmental allergies and food allergies.  Neurological: Negative for weakness and headaches.  Psychiatric/Behavioral: Negative for agitation.  All other systems reviewed and are negative.  Also here today for to follow up on Htn. He takes Metoprolol 25 mg one Bid.    Objective:   Physical Exam  Constitutional: He appears well-developed and well-nourished.  HENT:  Head: Normocephalic and atraumatic.  Right Ear: External ear normal.  Left Ear: External ear normal.  Nose: Nose normal.  Mouth/Throat: Oropharynx is clear and moist.  Eyes: Right eye exhibits no discharge. Left eye exhibits no discharge. No scleral icterus.  Neck:  Normal range of motion. Neck supple. No thyromegaly present.  Cardiovascular: Normal rate, regular rhythm and normal heart sounds.  No murmur heard. Pulmonary/Chest: Effort normal and breath sounds normal. No respiratory distress. He has no wheezes.  Abdominal: Soft. Bowel sounds are normal. He exhibits no distension and no mass. There is no tenderness.  Colostomy present intact mucosal rim within normal limits.  Genitourinary: Penis normal.  Musculoskeletal: Normal range of motion. He exhibits no edema.  Lymphadenopathy:    He has no cervical adenopathy.  Neurological: He is alert. He exhibits normal muscle tone. Coordination normal.  Skin: Skin is warm and dry. No erythema.  Psychiatric: He has a normal mood and affect. His behavior is normal. Judgment normal.          Assessment & Plan:  Impression wellness exam.  Delayed on colonoscopy.  Diet exercise discussed.  Vaccines discussed.  Hypertension good control discussed to maintain same meds  Status post rectal cancer followed closely by specialist  Diet exercise discussed medications refilled/flu shot today.

## 2017-10-15 DIAGNOSIS — Z85038 Personal history of other malignant neoplasm of large intestine: Secondary | ICD-10-CM | POA: Diagnosis not present

## 2017-10-15 DIAGNOSIS — Z933 Colostomy status: Secondary | ICD-10-CM | POA: Diagnosis not present

## 2017-11-06 ENCOUNTER — Other Ambulatory Visit: Payer: Self-pay | Admitting: *Deleted

## 2017-11-06 ENCOUNTER — Telehealth: Payer: Self-pay | Admitting: *Deleted

## 2017-11-06 MED ORDER — OMEPRAZOLE 40 MG PO CPDR: DELAYED_RELEASE_CAPSULE | ORAL | 1 refills | Status: DC

## 2017-11-06 NOTE — Telephone Encounter (Signed)
Med changed to once daily and pt notified.

## 2017-11-06 NOTE — Telephone Encounter (Signed)
Ok change to 40 once per day

## 2017-11-06 NOTE — Telephone Encounter (Signed)
Fax from [employer] Insurance will not cover Omeprazole dr 20mg  capsule take one bid. Will only cover once a day dosing 40mg . Or pantoprazole sod dr 40mg  tablet.

## 2018-02-20 ENCOUNTER — Encounter: Payer: Self-pay | Admitting: *Deleted

## 2018-02-20 ENCOUNTER — Ambulatory Visit (INDEPENDENT_AMBULATORY_CARE_PROVIDER_SITE_OTHER): Payer: Medicare HMO | Admitting: Gastroenterology

## 2018-02-20 ENCOUNTER — Other Ambulatory Visit: Payer: Self-pay | Admitting: *Deleted

## 2018-02-20 ENCOUNTER — Encounter: Payer: Self-pay | Admitting: Gastroenterology

## 2018-02-20 ENCOUNTER — Other Ambulatory Visit: Payer: Self-pay | Admitting: Gastroenterology

## 2018-02-20 DIAGNOSIS — R0600 Dyspnea, unspecified: Secondary | ICD-10-CM

## 2018-02-20 DIAGNOSIS — C20 Malignant neoplasm of rectum: Secondary | ICD-10-CM

## 2018-02-20 DIAGNOSIS — Z720 Tobacco use: Secondary | ICD-10-CM

## 2018-02-20 DIAGNOSIS — R0609 Other forms of dyspnea: Secondary | ICD-10-CM | POA: Diagnosis not present

## 2018-02-20 DIAGNOSIS — K219 Gastro-esophageal reflux disease without esophagitis: Secondary | ICD-10-CM

## 2018-02-20 MED ORDER — NA SULFATE-K SULFATE-MG SULF 17.5-3.13-1.6 GM/177ML PO SOLN: 1.0000 | ORAL | 0 refills | Status: DC

## 2018-02-20 NOTE — H&P (View-Only) (Signed)
Subjective:    Patient ID: [name], male    DOB: 12/03/1955, 62 y.o.   MRN: [id number]  Connor Kirschner, MD   HPI Still having  SORENESS IN THE R LOWER ABDOMINAL PAIN. WEIGHS 197 LBS. AFTER SURGERY HAD COUGH AND NEEDED BAND. OSTOMY DOING GOOD. EMPTIES BAG. MAY GET PIMPLES AROUND THE STOMA. HAS HAD CHEMICAL CAUTERIZATION IN THE OFC IN THE PAST.  LAST YEAR FELT RAW. CHANGES BAGS 4-5 DAYS. STOOLS ARE SOFT. NO WATERY STOOLS: AT LEAST 1-2X/WEEK. DRINKS LOTS OF WATER. PUTS CHEESE ON EVERYTHING. MISSED [place] 2 YRS AGO BECAUSE BAG TORE. NOW HAS A 3 MO OLD GRANDSON. MAY GET SHORT WINDED SINCE GAINED WEIGHT AND WASN'T LIKE THAT UNTIL AFTER SURGERY AUG 2018 SURGERY. NO RECTUM.  PT DENIES FEVER, CHILLS, HEMATOCHEZIA, HEMATEMESIS, nausea, vomiting, melena, CHEST PAIN, CHANGE IN BOWEL IN HABITS, constipation, problems swallowing, problems with sedation, OR heartburn or indigestion.  Past Medical History:  Diagnosis Date  . Arthritis   . Aspiration pneumonia ([place]) 03/24/2014  . Colostomy in place [facility])   . Difficulty urinating   . GERD (gastroesophageal reflux disease)   . Hypertension   . Ileostomy present ([place])   . Rectal cancer, uT2uN0, s/p LAR/ileostomy 03/20/2014 colostomy Aug2015 12/25/2013   TCS MAY 2015   . Shortening, leg, congenital    LEFT  . Skin irritation    "around ostomy site" -dry and reddened.  . Tachycardia 04/14/2014    Past Surgical History:  Procedure Laterality Date  . COLON RESECTION N/A 03/24/2014   Procedure: Diagnostic  laparotomy, takedown of coloanal anastamosis, end colostomy, with wound vac;  Surgeon: Adin Hector, MD;  [facility]: [facility];  Service: General;  Laterality: N/A;  . COLONOSCOPY N/A 12/22/2013   SLF: 1. Normal mucosa in the terminal ilium. 2. Moderate diverticulosis in the transverse colon, descending colon, and sigmoid colon. 3. Rectal bleeding due ot rectal mass and hemorrhoids 4. Moderate sized internal hemorrhoids.   . COLONOSCOPY N/A  01/19/2015   Procedure: COLONOSCOPY;  Surgeon: Danie Binder, MD;  [facility];  Service: Endoscopy;  Laterality: N/A;  1215  . COLOSTOMY    . ESOPHAGOGASTRODUODENOSCOPY N/A 12/22/2013   SLF: Probable Candia esophagitis2. Probable Barretts esphagus 3. Mild gastritis.   . EUS N/A 01/01/2014   Procedure: LOWER ENDOSCOPIC ULTRASOUND (EUS);  Surgeon: Milus Banister, MD;  Location[facility];  Service: Endoscopy;  Laterality: N/A;  . EXAMINATION UNDER ANESTHESIA  08/25/2014   Procedure: EXAM UNDER ANESTHESIA;  Surgeon: Michael Boston, MD;  [facility];  Service: General;;  . eye reattachment and ear reattachment  Right 1971  . FLEXIBLE SIGMOIDOSCOPY N/A 03/24/2014   Procedure: FLEXIBLE SIGMOIDOSCOPY;  Surgeon: Adin Hector, MD;  [facility];  Service: General;  Laterality: N/A;  . HIP FRACTURE SURGERY Left age 74 on 04-06-1970  . ILEOSTOMY    . ILEOSTOMY CLOSURE N/A 08/25/2014   Procedure: TAKEDOWN OF LOOP ILEOSTOMY ;  Surgeon: Michael Boston, MD;  [facility];  Service: General;  Laterality: N/A;  . INSERTION OF MESH N/A 04/11/2017   Procedure: INSERTION OF MESH ;  Surgeon: Michael Boston, MD;  [facility];  Service: General;  Laterality: N/A;  BILATERAL TAP BLOCK  . TOTAL HIP ARTHROPLASTY Left 11/19/2015   Procedure: LEFT TOTAL HIP ARTHROPLASTY ANTERIOR APPROACH;  Surgeon: Mcarthur Rossetti, MD;  [facility];  Service: Orthopedics;  Laterality: Left;  Marland Kitchen VENTRAL HERNIA REPAIR N/A 04/11/2017   Procedure: LAPAROSCOPIC INCARCERATED PERISTOMAL  AND INCARCERATED INCISIONAL HERNIA X2;  Surgeon: Michael Boston, MD;  [facility];  Service: General;  Laterality: N/A;  BILATERAL TAP BLOCK   No Known Allergies  Current Outpatient Medications  Medication Sig    . aspirin EC 81 MG tablet Take 81 mg by mouth daily.    Marland Kitchen gabapentin (NEURONTIN) 400 MG capsule Take 400 mg by mouth 2 (two) times daily.    . IRON PO Take 1 tablet by mouth every morning.    . LUTEIN PO Take 1  tablet by mouth 2 (two) times daily.     . metoprolol tartrate (LOPRESSOR) 25 MG tablet Take 1 tablet (25 mg total) by mouth 2 (two) times daily.    . Misc Natural Products (DANDELION ROOT PO) Take 1 tablet by mouth 2 (two) times daily as needed (TO PREVENT CANCER).     . Multiple Vitamins-Minerals (ONE DAILY FOR MEN 50+ ADVANCED PO) Take 1 tablet by mouth daily.     . naproxen (NAPROSYN) 500 MG tablet Take 1 tablet (500 mg total) by mouth 2 (two) times daily with a meal.    . Omega-3 Fatty Acids (FISH OIL) 1000 MG CAPS Take 1,000 mg by mouth daily.     Marland Kitchen omeprazole (PRILOSEC) 40 MG capsule TAKE 1 CAPSULE DAILY    . OVER THE COUNTER MEDICATION Vinegar tablet by mouth twice a day    . saw palmetto 500 MG capsule Take 500 mg by mouth 2 (two) times daily.    . Turmeric 500 MG CAPS Take 500 mg by mouth 2 (two) times daily.     . vitamin C (ASCORBIC ACID) 500 MG tablet Take 500 mg by mouth 2 (two) times daily.     Review of Systems PER HPI OTHERWISE ALL SYSTEMS ARE NEGATIVE.    Objective:   Physical Exam  Constitutional: He is oriented to person, place, and time. He appears well-developed and well-nourished. No distress.  HENT:  Head: Normocephalic and atraumatic.  Mouth/Throat: Oropharynx is clear and moist. No oropharyngeal exudate.  Eyes: Pupils are equal, round, and reactive to light. No scleral icterus.  Neck: Normal range of motion. Neck supple.  Cardiovascular: Normal rate, regular rhythm and normal heart sounds.  Pulmonary/Chest: Effort normal and breath sounds normal. No respiratory distress.  Abdominal: Soft. Bowel sounds are normal. He exhibits no distension. There is tenderness in the right lower quadrant. There is no rebound and no guarding.  OSTOMY IN LLQ, INCISIONS WELL HEALED  Musculoskeletal: He exhibits no edema.  Lymphadenopathy:    He has no cervical adenopathy.  Neurological: He is alert and oriented to person, place, and time.  NO FOCAL DEFICITS  Psychiatric: He has a  normal mood and affect.  Vitals reviewed.     Assessment & Plan:

## 2018-02-20 NOTE — Telephone Encounter (Signed)
[name], can you help with this?

## 2018-02-20 NOTE — Patient Instructions (Addendum)
ASK DR. Wolfgang Phoenix ABOUT ADJUSTING YOUR METOPROLOL SO YOU CAN HAVE LESS SHORTNESS OF BREATH WHEN YOU WORK.  SEE CARDIOLOGY TO MAKE SURE YOUR HEART IS OK.  COMPLETE COLONOSCOPY. HOLD IRON 7 DAYS PRIOR TO COLONOSCOPY.  CONTINUE OMEPRAZOLE.  TAKE 30 MINUTES PRIOR TO YOUR FIRST MEAL.   FOLLOW UP IN 1-2 YEAR.

## 2018-02-20 NOTE — Assessment & Plan Note (Signed)
NO BRBPR OR MELENA.  COMPLETE COLONOSCOPY. HOLD IRON 7 DAYS PRIOR TO COLONOSCOPY. DISCUSSED PROCEDURE, BENEFITS, & RISKS: < 1% chance of medication reaction, bleeding, perforation, or rupture of spleen/liver.  FOLLOW UP IN 1-2 YEAR.

## 2018-02-20 NOTE — Assessment & Plan Note (Signed)
LIKELY DUE TO BETA BLOCKER INDUCED EXERCISE INTOLERANCE.  ASK DR. Wolfgang Phoenix ABOUT ADJUSTING YOUR METOPROLOL SO YOU CAN HAVE LESS SHORTNESS OF BREATH WHEN YOU WORK. SEE CARDIOLOGY TO MAKE SURE YOUR HEART IS OK.  FOLLOW UP IN 1-2 YEAR.

## 2018-02-20 NOTE — Progress Notes (Signed)
Subjective:    Patient ID: [name], male    DOB: [birth year], 62 y.o.   MRN: [id number]  Connor Kirschner, MD   HPI Still having  SORENESS IN THE R LOWER ABDOMINAL PAIN. WEIGHS 197 LBS. AFTER SURGERY HAD COUGH AND NEEDED BAND. OSTOMY DOING GOOD. EMPTIES BAG. MAY GET PIMPLES AROUND THE STOMA. HAS HAD CHEMICAL CAUTERIZATION IN THE OFC IN THE PAST.  LAST YEAR FELT RAW. CHANGES BAGS 4-5 DAYS. STOOLS ARE SOFT. NO WATERY STOOLS: AT LEAST 1-2X/WEEK. DRINKS LOTS OF WATER. PUTS CHEESE ON EVERYTHING. MISSED [place] 2 YRS AGO BECAUSE BAG TORE. NOW HAS A 3 MO OLD GRANDSON. MAY GET SHORT WINDED SINCE GAINED WEIGHT AND WASN'T LIKE THAT UNTIL AFTER SURGERY AUG 2018 SURGERY. NO RECTUM.  PT DENIES FEVER, CHILLS, HEMATOCHEZIA, HEMATEMESIS, nausea, vomiting, melena, CHEST PAIN, CHANGE IN BOWEL IN HABITS, constipation, problems swallowing, problems with sedation, OR heartburn or indigestion.  Past Medical History:  Diagnosis Date  . Arthritis   . Aspiration pneumonia ([place]) 03/24/2014  . Colostomy in place [facility])   . Difficulty urinating   . GERD (gastroesophageal reflux disease)   . Hypertension   . Ileostomy present ([place])   . Rectal cancer, uT2uN0, s/p LAR/ileostomy 03/20/2014 colostomy Aug2015 12/25/2013   TCS MAY 2015   . Shortening, leg, congenital    LEFT  . Skin irritation    "around ostomy site" -dry and reddened.  . Tachycardia 04/14/2014    Past Surgical History:  Procedure Laterality Date  . COLON RESECTION N/A 03/24/2014   Procedure: Diagnostic  laparotomy, takedown of coloanal anastamosis, end colostomy, with wound vac;  Surgeon: Adin Hector, MD;  [facility];  Service: General;  Laterality: N/A;  . COLONOSCOPY N/A 12/22/2013   SLF: 1. Normal mucosa in the terminal ilium. 2. Moderate diverticulosis in the transverse colon, descending colon, and sigmoid colon. 3. Rectal bleeding due ot rectal mass and hemorrhoids 4. Moderate sized internal hemorrhoids.   . COLONOSCOPY N/A  01/19/2015   Procedure: COLONOSCOPY;  Surgeon: Danie Binder, MD;  [facility];  Service: Endoscopy;  Laterality: N/A;  1215  . COLOSTOMY    . ESOPHAGOGASTRODUODENOSCOPY N/A 12/22/2013   SLF: Probable Candia esophagitis2. Probable Barretts esphagus 3. Mild gastritis.   . EUS N/A 01/01/2014   Procedure: LOWER ENDOSCOPIC ULTRASOUND (EUS);  Surgeon: Milus Banister, MD;  [facility];  Service: Endoscopy;  Laterality: N/A;  . EXAMINATION UNDER ANESTHESIA  08/25/2014   Procedure: EXAM UNDER ANESTHESIA;  Surgeon: Michael Boston, MD;  [facility];  Service: General;;  . eye reattachment and ear reattachment  Right 1971  . FLEXIBLE SIGMOIDOSCOPY N/A 03/24/2014   Procedure: FLEXIBLE SIGMOIDOSCOPY;  Surgeon: Adin Hector, MD;  [facility];  Service: General;  Laterality: N/A;  . HIP FRACTURE SURGERY Left age 54 on 04-06-1970  . ILEOSTOMY    . ILEOSTOMY CLOSURE N/A 08/25/2014   Procedure: TAKEDOWN OF LOOP ILEOSTOMY ;  Surgeon: Michael Boston, MD;  [facility];  Service: General;  Laterality: N/A;  . INSERTION OF MESH N/A 04/11/2017   Procedure: INSERTION OF MESH ;  Surgeon: Michael Boston, MD;  [facility];  Service: General;  Laterality: N/A;  BILATERAL TAP BLOCK  . TOTAL HIP ARTHROPLASTY Left 11/19/2015   Procedure: LEFT TOTAL HIP ARTHROPLASTY ANTERIOR APPROACH;  Surgeon: Mcarthur Rossetti, MD;  [facility];  Service: Orthopedics;  Laterality: Left;  Marland Kitchen VENTRAL HERNIA REPAIR N/A 04/11/2017   Procedure: LAPAROSCOPIC INCARCERATED PERISTOMAL  AND INCARCERATED INCISIONAL HERNIA X2;  Surgeon: Michael Boston, MD;  [facility];  Service: General;  Laterality: N/A;  BILATERAL TAP BLOCK   No Known Allergies  Current Outpatient Medications  Medication Sig    . aspirin EC 81 MG tablet Take 81 mg by mouth daily.    Marland Kitchen gabapentin (NEURONTIN) 400 MG capsule Take 400 mg by mouth 2 (two) times daily.    . IRON PO Take 1 tablet by mouth every morning.    . LUTEIN PO Take 1  tablet by mouth 2 (two) times daily.     . metoprolol tartrate (LOPRESSOR) 25 MG tablet Take 1 tablet (25 mg total) by mouth 2 (two) times daily.    . Misc Natural Products (DANDELION ROOT PO) Take 1 tablet by mouth 2 (two) times daily as needed (TO PREVENT CANCER).     . Multiple Vitamins-Minerals (ONE DAILY FOR MEN 50+ ADVANCED PO) Take 1 tablet by mouth daily.     . naproxen (NAPROSYN) 500 MG tablet Take 1 tablet (500 mg total) by mouth 2 (two) times daily with a meal.    . Omega-3 Fatty Acids (FISH OIL) 1000 MG CAPS Take 1,000 mg by mouth daily.     Marland Kitchen omeprazole (PRILOSEC) 40 MG capsule TAKE 1 CAPSULE DAILY    . OVER THE COUNTER MEDICATION Vinegar tablet by mouth twice a day    . saw palmetto 500 MG capsule Take 500 mg by mouth 2 (two) times daily.    . Turmeric 500 MG CAPS Take 500 mg by mouth 2 (two) times daily.     . vitamin C (ASCORBIC ACID) 500 MG tablet Take 500 mg by mouth 2 (two) times daily.     Review of Systems PER HPI OTHERWISE ALL SYSTEMS ARE NEGATIVE.    Objective:   Physical Exam  Constitutional: He is oriented to person, place, and time. He appears well-developed and well-nourished. No distress.  HENT:  Head: Normocephalic and atraumatic.  Mouth/Throat: Oropharynx is clear and moist. No oropharyngeal exudate.  Eyes: Pupils are equal, round, and reactive to light. No scleral icterus.  Neck: Normal range of motion. Neck supple.  Cardiovascular: Normal rate, regular rhythm and normal heart sounds.  Pulmonary/Chest: Effort normal and breath sounds normal. No respiratory distress.  Abdominal: Soft. Bowel sounds are normal. He exhibits no distension. There is tenderness in the right lower quadrant. There is no rebound and no guarding.  OSTOMY IN LLQ, INCISIONS WELL HEALED  Musculoskeletal: He exhibits no edema.  Lymphadenopathy:    He has no cervical adenopathy.  Neurological: He is alert and oriented to person, place, and time.  NO FOCAL DEFICITS  Psychiatric: He has a  normal mood and affect.  Vitals reviewed.     Assessment & Plan:

## 2018-02-20 NOTE — Assessment & Plan Note (Signed)
SYMPTOMS CONTROLLED/RESOLVED.  CONTINUE OMEPRAZOLE.  TAKE 30 MINUTES PRIOR TO YOUR FIRST MEAL.   FOLLOW UP IN 1-2 YEAR.

## 2018-02-22 ENCOUNTER — Other Ambulatory Visit: Payer: Self-pay | Admitting: *Deleted

## 2018-02-22 MED ORDER — PEG 3350-KCL-NA BICARB-NACL 420 G PO SOLR: 4000.0000 mL | Freq: Once | ORAL | 0 refills | Status: AC

## 2018-02-22 NOTE — Telephone Encounter (Signed)
LMOVM for patient advising we are sending in new Rx for prep as copay was too expensive and I was mailing new instructions to him for the new prep.

## 2018-02-22 NOTE — Telephone Encounter (Signed)
Needs an alternative that is not expensive that pharmacy can cover. Looks like he was scheduled in office after a visit.

## 2018-02-22 NOTE — Telephone Encounter (Signed)
[name], it isn't letting me sign off on note. It is stating the med needs to be authorized.

## 2018-02-22 NOTE — Telephone Encounter (Signed)
[facility] clinical pool: can you help with this?

## 2018-02-22 NOTE — Telephone Encounter (Signed)
Patient had suprep sent into the pharmacy on 02/20/18.

## 2018-02-25 NOTE — Telephone Encounter (Signed)
Rx sent 

## 2018-02-25 NOTE — Telephone Encounter (Signed)
I think I fixed it. Are you able to send in now?

## 2018-03-10 ENCOUNTER — Other Ambulatory Visit: Payer: Self-pay | Admitting: Family Medicine

## 2018-03-14 ENCOUNTER — Ambulatory Visit (INDEPENDENT_AMBULATORY_CARE_PROVIDER_SITE_OTHER): Payer: Medicare HMO | Admitting: Family Medicine

## 2018-03-14 ENCOUNTER — Encounter: Payer: Self-pay | Admitting: Family Medicine

## 2018-03-14 ENCOUNTER — Encounter: Payer: Self-pay | Admitting: Cardiology

## 2018-03-14 VITALS — BP 142/86 | Ht 65.0 in | Wt 200.6 lb

## 2018-03-14 DIAGNOSIS — Z79899 Other long term (current) drug therapy: Secondary | ICD-10-CM

## 2018-03-14 DIAGNOSIS — I10 Essential (primary) hypertension: Secondary | ICD-10-CM | POA: Diagnosis not present

## 2018-03-14 DIAGNOSIS — E785 Hyperlipidemia, unspecified: Secondary | ICD-10-CM | POA: Diagnosis not present

## 2018-03-14 DIAGNOSIS — Z1329 Encounter for screening for other suspected endocrine disorder: Secondary | ICD-10-CM | POA: Diagnosis not present

## 2018-03-14 DIAGNOSIS — R5383 Other fatigue: Secondary | ICD-10-CM | POA: Diagnosis not present

## 2018-03-14 MED ORDER — OMEPRAZOLE 40 MG PO CPDR: DELAYED_RELEASE_CAPSULE | ORAL | 1 refills | Status: DC

## 2018-03-14 MED ORDER — METOPROLOL TARTRATE 25 MG PO TABS: 25.0000 mg | ORAL_TABLET | Freq: Two times a day (BID) | ORAL | 5 refills | Status: DC

## 2018-03-14 NOTE — Progress Notes (Signed)
   Subjective:    Patient ID: [name], male    DOB: 07-20-1956, 62 y.o.   MRN: [id number] Patient arrives office with multiple concerns   HPI Pt here today for 6 month follow up. Pt states he is going to Cardiologist next week have BP med changed.    Pt saw Dr.Fields and she began checking him over and stated that he was on wrong BP med so Dr.Fields made patient appt with cardiologist.   Pt states he has been SOB and fatigued and thinks it is from BP med.  Patient reports progressive fatigue over the last several months.  Worsened by exertion.  No true chest pain.  Does not short of breath fairly easily.  Patient complicated by history of rectal cancer.  Colonoscopy colostomy in 2015.  Patient has had appropriate follow-up since then with no evidence of recurrence.  Patient reports blood pressure overall in generally good control.  However patient's gastroenterologist thought his fatigue may be due to his medication.  He has been on this medicine for quite some time.  Patient reports working hard but not exercising   Review of Systems No headache, no major weight loss or weight gain, no chest pain no back pain abdominal pain no change in bowel habits complete ROS otherwise negative     Objective:   Physical Exam Alert and oriented, vitals reviewed and stable, NAD ENT-TM's and ext canals WNL bilat via otoscopic exam Soft palate, tonsils and post pharynx WNL via oropharyngeal exam Neck-symmetric, no masses; thyroid nonpalpable and nontender Pulmonary-no tachypnea or accessory muscle use; Clear without wheezes via auscultation Card--no abnrml murmurs, rhythm reg and rate WNL Carotid pulses symmetric, without bruits        Assessment & Plan:  Impression1 hypertension.  Good control.  Discussed.  To maintain same medication for now  2.  Fatigue.  Nonspecific.  We will do some screening blood work.  Rationale discussed with patient.  Further recommendations based on  results  3.  Status post rectal carcinoma/follow-up actively managed by gastroenterologist  Exercise encouraged.  Appropriate blood work.  Maintain same blood pressure medication for now.  Await cardiologist input.  Greater than 50% of this 25 minute face to face visit was spent in counseling and discussion and coordination of care regarding the above diagnosis/diagnosies

## 2018-03-14 NOTE — Progress Notes (Signed)
Cardiology Office Note  Date: 03/18/2018   ID: [name], DOB 03/12/1956, MRN [id number]  PCP: [name], MD  Consulting Cardiologist: Rozann Lesches, MD   Chief Complaint  Patient presents with  . Shortness of Breath    History of Present Illness: [name] is a 62 y.o. male referred for cardiology consultation by Dr. Wolfgang Phoenix for the evaluation of shortness of breath.  As I review the chart, I see that he was actually evaluated by Dr. Oneida Alar recently who was concerned about the patient being on metoprolol as a potential contributor to his symptoms.  He presents today stating that ever since he underwent colon surgery back in 2015 he has had intermittent problems with shortness of breath and lack of energy.  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  He has a colostomy bag in place.  He does not report any exertional chest pain or palpitations.  He has been on metoprolol long-term as an antihypertensive.  Patient underwent cardiac evaluation back in 2015 at which point GXT was negative for ischemia.  He has not had interval ischemic testing. I personally reviewed his ECG today which shows a sinus rhythm with PVC.  Past Medical History:  Diagnosis Date  . Arthritis   . Aspiration pneumonia ([place]) 03/24/2014  . Colostomy in place [facility])   . Difficulty urinating   . GERD (gastroesophageal reflux disease)   . Hypertension   . Ileostomy present ([place])   . Rectal cancer, uT2uN0, s/p LAR/ileostomy 03/20/2014 colostomy Aug2015 12/25/2013   TCS MAY 2015   . Shortening, leg, congenital    Left  . Skin irritation    Surrounding ostomy site    Past Surgical History:  Procedure Laterality Date  . COLON RESECTION N/A 03/24/2014   Procedure: Diagnostic  laparotomy, takedown of coloanal anastamosis, end colostomy, with wound vac;  Surgeon: Adin Hector, MD;  Location: [facility];  Service: General;  Laterality: N/A;  . COLONOSCOPY N/A 12/22/2013   SLF: 1. Normal mucosa in the terminal ilium. 2. Moderate diverticulosis in the transverse colon, descending colon, and sigmoid colon. 3. Rectal bleeding due ot rectal mass and hemorrhoids 4. Moderate sized internal hemorrhoids.   . COLONOSCOPY N/A 01/19/2015   Procedure: COLONOSCOPY;  Surgeon: Danie Binder, MD;  Location: [facility];  Service: Endoscopy;  Laterality: N/A;  1215  . COLOSTOMY    . ESOPHAGOGASTRODUODENOSCOPY N/A 12/22/2013   SLF: Probable Candia esophagitis2. Probable Barretts esphagus 3. Mild gastritis.   . EUS N/A 01/01/2014   Procedure: LOWER ENDOSCOPIC ULTRASOUND (EUS);  Surgeon: Milus Banister, MD;  Location: [facility];  Service: Endoscopy;  Laterality: N/A;  . EXAMINATION UNDER ANESTHESIA  08/25/2014   Procedure: EXAM UNDER ANESTHESIA;  Surgeon: Michael Boston, MD;  Location: [facility];  Service: General;;  . eye reattachment and ear reattachment  Right 1971  . FLEXIBLE SIGMOIDOSCOPY N/A 03/24/2014   Procedure: FLEXIBLE SIGMOIDOSCOPY;  Surgeon: Adin Hector, MD;  Location: [facility];  Service: General;  Laterality: N/A;  . HIP FRACTURE SURGERY Left age 73 on 04-06-1970  . ILEOSTOMY    . ILEOSTOMY CLOSURE N/A 08/25/2014   Procedure: TAKEDOWN OF LOOP ILEOSTOMY ;  Surgeon: Michael Boston, MD;  Location: [facility];  Service: General;  Laterality: N/A;  . INSERTION OF MESH N/A 04/11/2017   Procedure: INSERTION OF MESH ;  Surgeon: Michael Boston, MD;  Location: [facility];  Service: General;  Laterality: N/A;  BILATERAL TAP BLOCK  . TOTAL HIP ARTHROPLASTY Left 11/19/2015   Procedure: LEFT TOTAL HIP ARTHROPLASTY ANTERIOR APPROACH;  Surgeon: Mcarthur Rossetti, MD;  Location: [facility];  Service: Orthopedics;  Laterality: Left;  Marland Kitchen VENTRAL HERNIA REPAIR N/A 04/11/2017   Procedure: LAPAROSCOPIC INCARCERATED PERISTOMAL AND INCARCERATED INCISIONAL HERNIA X2;  Surgeon: Michael Boston, MD;  Location: [facility];  Service: General;  Laterality: N/A;  BILATERAL TAP BLOCK    Current Outpatient Medications    Medication Sig Dispense Refill  . aspirin EC 81 MG tablet Take 81 mg by mouth daily.    . LUTEIN PO Take 1 tablet by mouth 2 (two) times daily.     . Multiple Vitamins-Minerals (ONE DAILY FOR MEN 50+ ADVANCED PO) Take 1 tablet by mouth daily.     . Omega-3 Fatty Acids (FISH OIL) 1000 MG CAPS Take 1,000 mg by mouth daily.     Marland Kitchen omeprazole (PRILOSEC) 40 MG capsule TAKE 1 CAPSULE DAILY 90 capsule 1  . OVER THE COUNTER MEDICATION Take 1 tablet by mouth 2 (two) times daily. Vinegar tablet by mouth twice a day     . saw palmetto 500 MG capsule Take 500 mg by mouth 2 (two) times daily.    . Turmeric 500 MG CAPS Take 500 mg by mouth 2 (two) times daily.     . vitamin C (ASCORBIC ACID) 500 MG tablet Take 500 mg by mouth 2 (two) times daily.    . metoprolol tartrate (LOPRESSOR) 25 MG tablet Take 0.5 tablets (12.5 mg total) by mouth 2 (two) times daily. 90 tablet 0   No current facility-administered medications for this visit.    Allergies:  Patient has no known allergies.   Social History: The patient  reports that he quit smoking about 6 years ago. His smoking use included cigars. He smoked 0.00 packs per day for 15.00 years. He has never used smokeless tobacco. He reports that he does not drink alcohol or use drugs.   Family History: The patient's family history includes Colon cancer in his sister.   ROS:  Please see the history of present illness. Otherwise, complete review of systems is positive for intermittent lower back pain.  All other systems are reviewed and negative.   Physical Exam: VS:  BP 139/88   Pulse 75   Ht 5\' 5"  (1.651 m)   Wt 201 lb (91.2 kg)   SpO2 96%   BMI 33.45 kg/m , BMI Body mass index is 33.45 kg/m.  Wt Readings from Last 3 Encounters:  03/18/18 201 lb (91.2 kg)  03/14/18 200 lb 9.6 oz (91 kg)  02/20/18 197 lb (89.4 kg)    General: Patient appears comfortable at rest. HEENT: Conjunctiva and lids normal, oropharynx clear. Neck: Supple, no elevated JVP or  carotid bruits, no thyromegaly. Lungs: Clear to auscultation, nonlabored breathing at rest. Cardiac: Regular rate and rhythm, no S3, soft systolic murmur, no pericardial rub. Abdomen: Soft, nontender, bowel sounds present, colostomy bag in place. Extremities: No pitting edema, distal pulses 2+. Skin: Warm and dry. Musculoskeletal: No kyphosis. Neuropsychiatric: Alert and oriented x3, affect grossly appropriate.  ECG: I personally reviewed the tracing from 04/02/2017 which showed sinus bradycardia with prolonged PR interval.  Recent Labwork: 03/14/2018: ALT 14; AST 12; BUN 25; Creatinine, Ser 1.04; Hemoglobin 14.9; Platelets 322; Potassium 4.8; Sodium 138; TSH 2.580     Component Value Date/Time   CHOL 174 03/14/2018 0921   TRIG 70 03/14/2018 0921   HDL 41 03/14/2018 0921  CHOLHDL 4.2 03/14/2018 0921   LDLCALC 119 (H) 03/14/2018 0921   Assessment and Plan:  1.  Intermittent shortness of breath and fatigue, sounds to be fairly chronic based on patient description.  He reports no history of ischemic heart disease, last formal evaluation was in 2015.  Question was raised as to whether his beta-blocker could be contributing, certainly possible although his resting heart rate is not significantly bradycardic.  For the time being I asked him to cut Lopressor to 12.5 mg twice daily to see if he notices any improvement.  In light of his cardiac risk factors including age and gender, hypertension, and prior tobacco use we will also refer him for cardiac structural and ischemic testing via echocardiogram and Lexiscan Myoview.  2.  History of colon cancer status post surgery, colostomy in place.  He follows with Dr. Oneida Alar.  3.  Essential hypertension by history.  Plan to reduce Lopressor as noted above.  Depending on blood pressure trend he may need to have this switched to a different agent.  ARB or Norvasc would be considerations.  4.  Tobacco abuse in remission.  Current medicines were reviewed  with the patient today.   Orders Placed This Encounter  Procedures  . NM Myocar Multi W/Spect W/Wall Motion / EF  . EKG 12-Lead  . ECHOCARDIOGRAM COMPLETE    Disposition: Call with test results.  Signed, Satira Sark, MD, [facility] 03/18/2018 2:01 PM    [facility] [place], [place], [place] [address] Phone: [phone]; Fax: [phone]

## 2018-03-15 LAB — CBC WITH DIFFERENTIAL/PLATELET
BASOS: 1 %
Basophils Absolute: 0 10*3/uL (ref 0.0–0.2)
EOS (ABSOLUTE): 0.3 10*3/uL (ref 0.0–0.4)
EOS: 4 %
HEMATOCRIT: 44.7 % (ref 37.5–51.0)
Hemoglobin: 14.9 g/dL (ref 13.0–17.7)
IMMATURE GRANULOCYTES: 0 %
Immature Grans (Abs): 0 10*3/uL (ref 0.0–0.1)
LYMPHS ABS: 1.4 10*3/uL (ref 0.7–3.1)
Lymphs: 17 %
MCH: 30.5 pg (ref 26.6–33.0)
MCHC: 33.3 g/dL (ref 31.5–35.7)
MCV: 91 fL (ref 79–97)
MONOS ABS: 0.6 10*3/uL (ref 0.1–0.9)
Monocytes: 8 %
NEUTROS ABS: 5.7 10*3/uL (ref 1.4–7.0)
NEUTROS PCT: 70 %
PLATELETS: 322 10*3/uL (ref 150–450)
RBC: 4.89 x10E6/uL (ref 4.14–5.80)
RDW: 13.1 % (ref 12.3–15.4)
WBC: 8.1 10*3/uL (ref 3.4–10.8)

## 2018-03-15 LAB — HEPATIC FUNCTION PANEL
ALT: 14 IU/L (ref 0–44)
AST: 12 IU/L (ref 0–40)
Albumin: 3.9 g/dL (ref 3.6–4.8)
Alkaline Phosphatase: 75 IU/L (ref 39–117)
BILIRUBIN TOTAL: 0.3 mg/dL (ref 0.0–1.2)
Bilirubin, Direct: 0.09 mg/dL (ref 0.00–0.40)
Total Protein: 6.2 g/dL (ref 6.0–8.5)

## 2018-03-15 LAB — BASIC METABOLIC PANEL
BUN/Creatinine Ratio: 24 (ref 10–24)
BUN: 25 mg/dL (ref 8–27)
CALCIUM: 9.2 mg/dL (ref 8.6–10.2)
CO2: 22 mmol/L (ref 20–29)
Chloride: 102 mmol/L (ref 96–106)
Creatinine, Ser: 1.04 mg/dL (ref 0.76–1.27)
GFR calc Af Amer: 89 mL/min/{1.73_m2} (ref >59)
GFR, EST NON AFRICAN AMERICAN: 77 mL/min/{1.73_m2} (ref >59)
Glucose: 90 mg/dL (ref 65–99)
POTASSIUM: 4.8 mmol/L (ref 3.5–5.2)
SODIUM: 138 mmol/L (ref 134–144)

## 2018-03-15 LAB — LIPID PANEL
CHOLESTEROL TOTAL: 174 mg/dL (ref 100–199)
Chol/HDL Ratio: 4.2 ratio (ref 0.0–5.0)
HDL: 41 mg/dL (ref >39)
LDL CALC: 119 mg/dL — AB (ref 0–99)
Triglycerides: 70 mg/dL (ref 0–149)
VLDL Cholesterol Cal: 14 mg/dL (ref 5–40)

## 2018-03-15 LAB — TSH: TSH: 2.58 u[IU]/mL (ref 0.450–4.500)

## 2018-03-18 ENCOUNTER — Ambulatory Visit (INDEPENDENT_AMBULATORY_CARE_PROVIDER_SITE_OTHER): Payer: Medicare HMO | Admitting: Cardiology

## 2018-03-18 ENCOUNTER — Telehealth: Payer: Self-pay | Admitting: Cardiology

## 2018-03-18 ENCOUNTER — Encounter: Payer: Self-pay | Admitting: Cardiology

## 2018-03-18 ENCOUNTER — Other Ambulatory Visit: Payer: Self-pay

## 2018-03-18 VITALS — BP 139/88 | HR 75 | Ht 65.0 in | Wt 201.0 lb

## 2018-03-18 DIAGNOSIS — F17201 Nicotine dependence, unspecified, in remission: Secondary | ICD-10-CM

## 2018-03-18 DIAGNOSIS — I10 Essential (primary) hypertension: Secondary | ICD-10-CM | POA: Diagnosis not present

## 2018-03-18 DIAGNOSIS — R0602 Shortness of breath: Secondary | ICD-10-CM

## 2018-03-18 DIAGNOSIS — Z85038 Personal history of other malignant neoplasm of large intestine: Secondary | ICD-10-CM

## 2018-03-18 DIAGNOSIS — R69 Illness, unspecified: Secondary | ICD-10-CM | POA: Diagnosis not present

## 2018-03-18 DIAGNOSIS — R0609 Other forms of dyspnea: Secondary | ICD-10-CM

## 2018-03-18 DIAGNOSIS — R0600 Dyspnea, unspecified: Secondary | ICD-10-CM

## 2018-03-18 MED ORDER — METOPROLOL TARTRATE 25 MG PO TABS: 12.5000 mg | ORAL_TABLET | Freq: Two times a day (BID) | ORAL | 0 refills | Status: DC

## 2018-03-18 NOTE — Patient Instructions (Signed)
Medication Instructions:  Your physician has recommended you make the following change in your medication:   DECREASE Lopressor to 12.5 mg twice daily  Please continue all other medications as prescribed   Labwork: NONE  Testing/Procedures: Your physician has requested that you have an echocardiogram. Echocardiography is a painless test that uses sound waves to create images of your heart. It provides your doctor with information about the size and shape of your heart and how well your heart's chambers and valves are working. This procedure takes approximately one hour. There are no restrictions for this procedure.  Your physician has requested that you have a lexiscan myoview. For further information please visit [email or web address]. Please follow instruction sheet, as given.   Follow-Up: Your physician recommends that you schedule a follow-up appointment PENDING TEST RESULTS   Any Other Special Instructions Will Be Listed Below (If Applicable).  If you need a refill on your cardiac medications before your next appointment, please call your pharmacy.

## 2018-03-18 NOTE — Telephone Encounter (Signed)
°  Precert needed for:  lexi- dyspnea on exertion    Location:  [facility]    Date: Mar 27, 2018

## 2018-03-18 NOTE — Telephone Encounter (Signed)
°  Precert needed for: Echo- dyspnea on exertion   Location: [facility]    Date:  Mar 26, 2018

## 2018-03-20 ENCOUNTER — Telehealth: Payer: Self-pay

## 2018-03-20 NOTE — Telephone Encounter (Signed)
Pt called office, TCS for 03/22/18 moved up to 10:30am. Advised him to arrive at 9:30am. Start drinking 2nd half of prep at 5:30am and NPO after 7:30am. Endo scheduler informed.

## 2018-03-20 NOTE — Telephone Encounter (Signed)
Tried to call pt to see is he would like to arrive earlier for TCS 03/22/18 d/t a cancellation, no answer, LMOVM.

## 2018-03-22 ENCOUNTER — Encounter (HOSPITAL_COMMUNITY): Payer: Self-pay

## 2018-03-22 ENCOUNTER — Encounter (HOSPITAL_COMMUNITY)
Admission: RE | Disposition: A | Discharge status: Home / Self Care | Payer: Self-pay | Source: Ambulatory Visit | Attending: Gastroenterology

## 2018-03-22 ENCOUNTER — Ambulatory Visit (HOSPITAL_COMMUNITY)
Admission: RE | Admit: 2018-03-22 | Discharge: 2018-03-22 | Disposition: A | Discharge status: Home / Self Care | Payer: Medicare HMO | Source: Ambulatory Visit | Attending: Gastroenterology | Admitting: Gastroenterology

## 2018-03-22 ENCOUNTER — Other Ambulatory Visit: Payer: Self-pay

## 2018-03-22 DIAGNOSIS — Z85048 Personal history of other malignant neoplasm of rectum, rectosigmoid junction, and anus: Secondary | ICD-10-CM | POA: Diagnosis not present

## 2018-03-22 DIAGNOSIS — I10 Essential (primary) hypertension: Secondary | ICD-10-CM | POA: Insufficient documentation

## 2018-03-22 DIAGNOSIS — K5730 Diverticulosis of large intestine without perforation or abscess without bleeding: Secondary | ICD-10-CM | POA: Diagnosis not present

## 2018-03-22 DIAGNOSIS — Z79899 Other long term (current) drug therapy: Secondary | ICD-10-CM | POA: Diagnosis not present

## 2018-03-22 DIAGNOSIS — Z1211 Encounter for screening for malignant neoplasm of colon: Secondary | ICD-10-CM | POA: Insufficient documentation

## 2018-03-22 DIAGNOSIS — Z7982 Long term (current) use of aspirin: Secondary | ICD-10-CM | POA: Insufficient documentation

## 2018-03-22 DIAGNOSIS — Z933 Colostomy status: Secondary | ICD-10-CM | POA: Diagnosis not present

## 2018-03-22 DIAGNOSIS — Z85038 Personal history of other malignant neoplasm of large intestine: Secondary | ICD-10-CM | POA: Diagnosis not present

## 2018-03-22 DIAGNOSIS — C20 Malignant neoplasm of rectum: Secondary | ICD-10-CM

## 2018-03-22 DIAGNOSIS — Z96642 Presence of left artificial hip joint: Secondary | ICD-10-CM | POA: Insufficient documentation

## 2018-03-22 DIAGNOSIS — K219 Gastro-esophageal reflux disease without esophagitis: Secondary | ICD-10-CM | POA: Diagnosis not present

## 2018-03-22 HISTORY — PX: COLONOSCOPY: SHX5424

## 2018-03-22 SURGERY — COLONOSCOPY
Anesthesia: Moderate Sedation

## 2018-03-22 MED ORDER — MIDAZOLAM HCL 5 MG/5ML IJ SOLN
INTRAMUSCULAR | Status: AC
  Filled 2018-03-22: qty 10

## 2018-03-22 MED ORDER — SODIUM CHLORIDE 0.9 % IV SOLN
INTRAVENOUS | Status: DC
  Administered 2018-03-22: 10:00:00 via INTRAVENOUS

## 2018-03-22 MED ORDER — SIMETHICONE 40 MG/0.6ML PO SUSP
ORAL | Status: DC | PRN
  Administered 2018-03-22: 1.5 mL

## 2018-03-22 MED ORDER — MEPERIDINE HCL 100 MG/ML IJ SOLN
INTRAMUSCULAR | Status: AC
  Filled 2018-03-22: qty 2

## 2018-03-22 MED ORDER — MEPERIDINE HCL 100 MG/ML IJ SOLN
INTRAMUSCULAR | Status: DC | PRN
  Administered 2018-03-22 (×2): 25 mg via INTRAVENOUS

## 2018-03-22 MED ORDER — MIDAZOLAM HCL 5 MG/5ML IJ SOLN
INTRAMUSCULAR | Status: DC | PRN
  Administered 2018-03-22 (×2): 2 mg via INTRAVENOUS

## 2018-03-22 NOTE — Op Note (Addendum)
[facility] Patient Name: [name] Procedure Date: 03/22/2018 9:45 AM MRN: [id number] Date of Birth: [birth year] Attending MD: Barney Drain MD, MD CSN: [id number] Age: 62 Admit Type: Outpatient Procedure:                Colonoscopy, SURVEILLANCE Indications:              Personal history of malignant rectal                            neoplasm,LASTCT JUN 2018: NO RECURRENT DISEASE Providers:                Barney Drain MD, MD, Charlsie Quest. Theda Sers RN, RN,                            Aram Candela Referring MD:             Rosemary Holms, MD Medicines:                Meperidine 50 mg IV, Midazolam 4 mg IV Complications:            No immediate complications. Estimated Blood Loss:     Estimated blood loss: none. Procedure:                Pre-Anesthesia Assessment:                           - Prior to the procedure, a History and Physical                            was performed, and patient medications and                            allergies were reviewed. The patient's tolerance of                            previous anesthesia was also reviewed. The risks                            and benefits of the procedure and the sedation                            options and risks were discussed with the patient.                            All questions were answered, and informed consent                            was obtained. Prior Anticoagulants: The patient has                            taken aspirin, last dose was 7 days prior to                            procedure. ASA Grade Assessment: II - A patient  with mild systemic disease. After reviewing the                            risks and benefits, the patient was deemed in                            satisfactory condition to undergo the procedure.                            After obtaining informed consent, the colonoscope                            was passed under direct vision. Throughout the                    procedure, the patient's blood pressure, pulse, and                            oxygen saturations were monitored continuously. The                            CF-HQ190L ([id number]) scope was introduced through                            the anus and advanced to the the cecum, identified                            by appendiceal orifice and ileocecal valve. The                            colonoscopy was performed without difficulty. The                            patient tolerated the procedure well. The quality                            of the bowel preparation was excellent. The                            ileocecal valve and the appendiceal orifice were                            photographed. Scope In: 10:20:33 AM Scope Out: 10:29:09 AM Scope Withdrawal Time: 0 hours 7 minutes 53 seconds  Total Procedure Duration: 0 hours 8 minutes 36 seconds  Findings:      Multiple small-mouthed diverticula were found in the transverse colon       and ascending colon.      The surgical stoma appeared normal. Impression:               - Diverticulosis in the transverse colon and in the                            ascending colon. has ~35 cm of COLON REMAINING.                           -  The surgical stoma is normal. Moderate Sedation:      Moderate (conscious) sedation was administered by the endoscopy nurse       and supervised by the endoscopist. The following parameters were       monitored: oxygen saturation, heart rate, blood pressure, and response       to care. Total physician intraservice time was 23 minutes. Recommendation:           - Patient has a contact number available for                            emergencies. The signs and symptoms of potential                            delayed complications were discussed with the                            patient. Return to normal activities tomorrow.                            Written discharge instructions were provided to the                             patient.                           - High fiber diet.                           - Continue present medications.                           - SEE ONCOLOGY IN 2019.                           - Repeat colonoscopy in 5 years for surveillance.                            NEEDS 2Ls OF PREP OR LESS. Procedure Code(s):        --- Professional ---                           [phone], Colonoscopy, flexible; diagnostic, including                            collection of specimen(s) by brushing or washing,                            when performed (separate procedure)                           G0500, Moderate sedation services provided by the                            same physician or other qualified health care  professional performing a gastrointestinal                            endoscopic service that sedation supports,                            requiring the presence of an independent trained                            observer to assist in the monitoring of the                            patient's level of consciousness and physiological                            status; initial 15 minutes of intra-service time;                            patient age 32 years or older (additional time may                            be reported with [phone], as appropriate)                           [phone], Moderate sedation services provided by the                            same physician or other qualified health care                            professional performing the diagnostic or                            therapeutic service that the sedation supports,                            requiring the presence of an independent trained                            observer to assist in the monitoring of the                            patient's level of consciousness and physiological                            status; each additional 15 minutes intraservice                             time (List separately in addition to code for                            primary service) Diagnosis Code(s):        --- Professional ---  Z85.048, Personal history of other malignant                            neoplasm of rectum, rectosigmoid junction, and anus                           K57.30, Diverticulosis of large intestine without                            perforation or abscess without bleeding CPT copyright 2017 [employer] Association. All rights reserved. The codes documented in this report are preliminary and upon coder review may  be revised to meet current compliance requirements. Barney Drain, MD Barney Drain MD, MD 03/22/2018 10:44:33 AM This report has been signed electronically. Number of Addenda: 0

## 2018-03-22 NOTE — Interval H&P Note (Signed)
History and Physical Interval Note:  03/22/2018 9:57 AM  [name]  has presented today for surgery, with the diagnosis of rectal cancer  The various methods of treatment have been discussed with the patient and family. After consideration of risks, benefits and other options for treatment, the patient has consented to  Procedure(s) with comments: COLONOSCOPY (N/A) - 2:15pm as a surgical intervention .  The patient's history has been reviewed, patient examined, no change in status, stable for surgery.  I have reviewed the patient's chart and labs.  Questions were answered to the patient's satisfaction.     [employer]

## 2018-03-22 NOTE — Discharge Instructions (Signed)
You DID NOT HAVE ANY POLYPS. YOU HAVE DIVERTICULOSIS IN YOUR REMAINING COLON.    DRINK WATER TO KEEP URINE LIGHT YELLOW.  FOLLOW A HIGH FIBER DIET. AVOID ITEMS THAT CAUSE BLOATING & GAS. SEE INFO BELOW.  YOU DO NEED A ROUTINE FOLLOW UP APPT WITH ONCOLOGY. WE WILL MAKE THE REFERRAL TODAY.  Next colonoscopy in 5 years AND YOU ONLY NEED TO DRINK 2Ls OF PREP. YOUR SISTERS, BROTHERS, CHILDREN, AND PARENTS NEED TO HAVE A COLONOSCOPY STARTING AT THE AGE OF 40 and then every 5 years.   Colonoscopy Care After Read the instructions outlined below and refer to this sheet in the next week. These discharge instructions provide you with general information on caring for yourself after you leave the hospital. While your treatment has been planned according to the most current medical practices available, unavoidable complications occasionally occur. If you have any problems or questions after discharge, call DR. Milanie Rosenfield, [phone].  ACTIVITY  You may resume your regular activity, but move at a slower pace for the next 24 hours.   Take frequent rest periods for the next 24 hours.   Walking will help get rid of the air and reduce the bloated feeling in your belly (abdomen).   No driving for 24 hours (because of the medicine (anesthesia) used during the test).   You may shower.   Do not sign any important legal documents or operate any machinery for 24 hours (because of the anesthesia used during the test).    NUTRITION  Drink plenty of fluids.   You may resume your normal diet as instructed by your doctor.   Begin with a light meal and progress to your normal diet. Heavy or fried foods are harder to digest and may make you feel sick to your stomach (nauseated).   Avoid alcoholic beverages for 24 hours or as instructed.    MEDICATIONS  You may resume your normal medications.   WHAT YOU CAN EXPECT TODAY  Some feelings of bloating in the abdomen.   Passage of more gas than usual.    Spotting of blood in your stool or on the toilet paper  .  IF YOU HAD POLYPS REMOVED DURING THE COLONOSCOPY:  Eat a soft diet IF YOU HAVE NAUSEA, BLOATING, ABDOMINAL PAIN, OR VOMITING.    FINDING OUT THE RESULTS OF YOUR TEST Not all test results are available during your visit. DR. Oneida Alar WILL CALL YOU WITHIN 7 DAYS OF YOUR PROCEDUE WITH YOUR RESULTS. Do not assume everything is normal if you have not heard from DR. Audrey Thull IN ONE WEEK, CALL HER OFFICE AT [phone].  SEEK IMMEDIATE MEDICAL ATTENTION AND CALL THE OFFICE: [phone] IF:  You have more than a spotting of blood in your stool.   Your belly is swollen (abdominal distention).   You are nauseated or vomiting.   You have a temperature over 101F.   You have abdominal pain or discomfort that is severe or gets worse throughout the day.    High-Fiber Diet A high-fiber diet changes your normal diet to include more whole grains, legumes, fruits, and vegetables. Changes in the diet involve replacing refined carbohydrates with unrefined foods. The calorie level of the diet is essentially unchanged. The Dietary Reference Intake (recommended amount) for adult males is 38 grams per day. For adult females, it is 25 grams per day. Pregnant and lactating women should consume 28 grams of fiber per day. Fiber is the intact part of a plant that is not broken down during  digestion. Functional fiber is fiber that has been isolated from the plant to provide a beneficial effect in the body. PURPOSE  Increase stool bulk.   Ease and regulate bowel movements.   Lower cholesterol.   REDUCE RISK OF COLON CANCER  INDICATIONS THAT YOU NEED MORE FIBER  Constipation and hemorrhoids.   Uncomplicated diverticulosis (intestine condition) and irritable bowel syndrome.   Weight management.   As a protective measure against hardening of the arteries (atherosclerosis), diabetes, and cancer.   GUIDELINES FOR INCREASING FIBER IN THE  DIET  Start adding fiber to the diet slowly. A gradual increase of about 5 more grams (2 slices of whole-wheat bread, 2 servings of most fruits or vegetables, or 1 bowl of high-fiber cereal) per day is best. Too rapid an increase in fiber may result in constipation, flatulence, and bloating.   Drink enough water and fluids to keep your urine clear or pale yellow. Water, juice, or caffeine-free drinks are recommended. Not drinking enough fluid may cause constipation.   Eat a variety of high-fiber foods rather than one type of fiber.   Try to increase your intake of fiber through using high-fiber foods rather than fiber pills or supplements that contain small amounts of fiber.   The goal is to change the types of food eaten. Do not supplement your present diet with high-fiber foods, but replace foods in your present diet.   INCLUDE A VARIETY OF FIBER SOURCES  Replace refined and processed grains with whole grains, canned fruits with fresh fruits, and incorporate other fiber sources. White rice, white breads, and most bakery goods contain little or no fiber.   Brown whole-grain rice, buckwheat oats, and many fruits and vegetables are all good sources of fiber. These include: broccoli, Brussels sprouts, cabbage, cauliflower, beets, sweet potatoes, white potatoes (skin on), carrots, tomatoes, eggplant, squash, berries, fresh fruits, and dried fruits.   Cereals appear to be the richest source of fiber. Cereal fiber is found in whole grains and bran. Bran is the fiber-rich outer coat of cereal grain, which is largely removed in refining. In whole-grain cereals, the bran remains. In breakfast cereals, the largest amount of fiber is found in those with "bran" in their names. The fiber content is sometimes indicated on the label.   You may need to include additional fruits and vegetables each day.   In baking, for 1 cup white flour, you may use the following substitutions:   1 cup whole-wheat flour  minus 2 tablespoons.   1/2 cup white flour plus 1/2 cup whole-wheat flour.   Diverticulosis Diverticulosis is a common condition that develops when small pouches (diverticula) form in the wall of the colon. The risk of diverticulosis increases with age. It happens more often in people who eat a low-fiber diet. Most individuals with diverticulosis have no symptoms. Those individuals with symptoms usually experience belly (abdominal) pain, constipation, or loose stools (diarrhea).  HOME CARE INSTRUCTIONS  Increase the amount of fiber in your diet as directed by your caregiver or dietician. This may reduce symptoms of diverticulosis.   Drink at least 6 to 8 glasses of water each day to prevent constipation.   Try not to strain when you have a bowel movement.   THERE IS NO NEED TO Avoid nuts and seeds to prevent complications.   FOODS HAVING HIGH FIBER CONTENT INCLUDE:  Fruits. Apple, peach, pear, tangerine, raisins, prunes.   Vegetables. Brussels sprouts, asparagus, broccoli, cabbage, carrot, cauliflower, romaine lettuce, spinach, summer squash, tomato, winter squash,  zucchini.   Starchy Vegetables. Baked beans, kidney beans, lima beans, split peas, lentils, potatoes (with skin).   Grains. Whole wheat bread, brown rice, bran flake cereal, plain oatmeal, white rice, shredded wheat, bran muffins.

## 2018-03-26 ENCOUNTER — Other Ambulatory Visit: Payer: Self-pay

## 2018-03-26 ENCOUNTER — Ambulatory Visit (INDEPENDENT_AMBULATORY_CARE_PROVIDER_SITE_OTHER): Payer: Medicare HMO

## 2018-03-26 DIAGNOSIS — R0609 Other forms of dyspnea: Secondary | ICD-10-CM | POA: Diagnosis not present

## 2018-03-26 DIAGNOSIS — R0600 Dyspnea, unspecified: Secondary | ICD-10-CM

## 2018-03-27 ENCOUNTER — Encounter (HOSPITAL_COMMUNITY): Payer: Self-pay

## 2018-03-27 ENCOUNTER — Encounter (HOSPITAL_BASED_OUTPATIENT_CLINIC_OR_DEPARTMENT_OTHER)
Admission: RE | Admit: 2018-03-27 | Discharge: 2018-03-27 | Disposition: A | Discharge status: Home / Self Care | Payer: Medicare HMO | Source: Ambulatory Visit | Attending: Cardiology | Admitting: Cardiology

## 2018-03-27 ENCOUNTER — Telehealth: Payer: Self-pay

## 2018-03-27 ENCOUNTER — Encounter (HOSPITAL_COMMUNITY)
Admission: RE | Admit: 2018-03-27 | Discharge: 2018-03-27 | Disposition: A | Discharge status: Home / Self Care | Payer: Medicare HMO | Source: Ambulatory Visit | Attending: Cardiology | Admitting: Cardiology

## 2018-03-27 DIAGNOSIS — R0609 Other forms of dyspnea: Secondary | ICD-10-CM | POA: Insufficient documentation

## 2018-03-27 DIAGNOSIS — R0600 Dyspnea, unspecified: Secondary | ICD-10-CM

## 2018-03-27 LAB — NM MYOCAR MULTI W/SPECT W/WALL MOTION / EF
CHL CUP NUCLEAR SRS: 1
CHL CUP NUCLEAR SSS: 4
LV dias vol: 115 mL (ref 62–150)
LV sys vol: 59 mL
Peak HR: 159 {beats}/min
RATE: 0.31
Rest HR: 57 {beats}/min
SDS: 3
TID: 1.14

## 2018-03-27 MED ORDER — TECHNETIUM TC 99M TETROFOSMIN IV KIT
10.0000 | PACK | Freq: Once | INTRAVENOUS | Status: AC | PRN
Start: 2018-03-27 — End: 2018-03-27
  Administered 2018-03-27: 10.6 via INTRAVENOUS

## 2018-03-27 MED ORDER — REGADENOSON 0.4 MG/5ML IV SOLN
INTRAVENOUS | Status: AC
  Administered 2018-03-27: 0.4 mg via INTRAVENOUS
  Filled 2018-03-27: qty 5

## 2018-03-27 MED ORDER — SODIUM CHLORIDE 0.9% FLUSH
INTRAVENOUS | Status: AC
  Administered 2018-03-27: 10 mL via INTRAVENOUS
  Filled 2018-03-27: qty 10

## 2018-03-27 MED ORDER — TECHNETIUM TC 99M TETROFOSMIN IV KIT
30.0000 | PACK | Freq: Once | INTRAVENOUS | Status: AC | PRN
  Administered 2018-03-27: 33 via INTRAVENOUS

## 2018-03-27 NOTE — Telephone Encounter (Signed)
-----   Message from Erma Heritage, [place] sent at 03/26/2018  4:53 PM EDT ----- Covering for Dr. Domenic Polite - Please let the patient know that his echocardiogram showed normal pumping function of the heart with no regional wall motion abnormalities. Did have mild abnormal relaxation of the heart which is common to see with a history of hypertension.  No significant valve abnormalities.  Please forward a copy to [name], MD. Thank you.

## 2018-03-27 NOTE — Telephone Encounter (Signed)
Patient notified. Routed to PCP 

## 2018-03-28 ENCOUNTER — Encounter (HOSPITAL_COMMUNITY): Payer: Self-pay | Admitting: Gastroenterology

## 2018-03-29 ENCOUNTER — Telehealth: Payer: Self-pay

## 2018-03-29 NOTE — Telephone Encounter (Signed)
Patient notified. Routed to PCP 

## 2018-03-29 NOTE — Telephone Encounter (Signed)
-----   Message from Erma Heritage, [place] sent at 03/27/2018  4:45 PM EDT ----- Covering for Dr. Domenic Polite - Please let the patient know that his stress test showed evidence of only possible mild ischemia and was overall read as a low-risk study. EF was reduced on imaging, but this was normal by his echocardiogram performed yesterday.  No indication for further testing at this time.  Please forward a copy to [name], MD. Thank you.

## 2018-04-03 ENCOUNTER — Ambulatory Visit (HOSPITAL_COMMUNITY): Payer: Medicare HMO | Admitting: Hematology

## 2018-04-03 ENCOUNTER — Inpatient Hospital Stay (HOSPITAL_COMMUNITY): Payer: Medicare HMO

## 2018-04-03 ENCOUNTER — Inpatient Hospital Stay (HOSPITAL_COMMUNITY): Payer: Medicare HMO | Attending: Hematology | Admitting: Hematology

## 2018-04-03 ENCOUNTER — Other Ambulatory Visit: Payer: Self-pay

## 2018-04-03 ENCOUNTER — Encounter (HOSPITAL_COMMUNITY): Payer: Self-pay | Admitting: Hematology

## 2018-04-03 VITALS — BP 133/95 | HR 66 | Temp 97.9°F | Resp 18 | Wt 196.5 lb

## 2018-04-03 DIAGNOSIS — M1990 Unspecified osteoarthritis, unspecified site: Secondary | ICD-10-CM | POA: Diagnosis not present

## 2018-04-03 DIAGNOSIS — Z87891 Personal history of nicotine dependence: Secondary | ICD-10-CM | POA: Diagnosis not present

## 2018-04-03 DIAGNOSIS — Z932 Ileostomy status: Secondary | ICD-10-CM | POA: Diagnosis not present

## 2018-04-03 DIAGNOSIS — K5730 Diverticulosis of large intestine without perforation or abscess without bleeding: Secondary | ICD-10-CM | POA: Diagnosis not present

## 2018-04-03 DIAGNOSIS — C20 Malignant neoplasm of rectum: Secondary | ICD-10-CM | POA: Diagnosis not present

## 2018-04-03 DIAGNOSIS — I10 Essential (primary) hypertension: Secondary | ICD-10-CM | POA: Insufficient documentation

## 2018-04-03 DIAGNOSIS — K219 Gastro-esophageal reflux disease without esophagitis: Secondary | ICD-10-CM | POA: Diagnosis not present

## 2018-04-03 DIAGNOSIS — Z79899 Other long term (current) drug therapy: Secondary | ICD-10-CM | POA: Insufficient documentation

## 2018-04-03 DIAGNOSIS — Z800 Family history of malignant neoplasm of digestive organs: Secondary | ICD-10-CM | POA: Diagnosis not present

## 2018-04-03 DIAGNOSIS — Z7982 Long term (current) use of aspirin: Secondary | ICD-10-CM | POA: Insufficient documentation

## 2018-04-03 NOTE — Progress Notes (Signed)
[facility] NOTE  Patient Care Team: [name], MD as PCP - General (Family Medicine) Satira Sark, MD as Consulting Physician (Cardiology) Danie Binder, MD as Consulting Physician (Gastroenterology) Milus Banister, MD as Attending Physician (Gastroenterology) Farrel Gobble, MD (Inactive) as Consulting Physician (Medical Oncology) Michael Boston, MD as Consulting Physician (General Surgery) Bjorn Loser, MD as Consulting Physician (Urology) Danie Binder, MD as Consulting Physician (Gastroenterology)  CHIEF COMPLAINTS/PURPOSE OF CONSULTATION:  Follow-up for rectal cancer.  HISTORY OF PRESENTING ILLNESS:  [name] 62 y.o. male is seen in consultation today for further work-up and establishing follow-up care for his rectal cancer.  He had a colonoscopy on Dec 22, 2013 which showed rectal mass consistent with a rectal adenocarcinoma.  MMR was normal.  On 03/20/2014 he underwent low anterior resection and ileostomy.  Preoperative ultrasound staging was UT2N0.  Pathology showed grade 2 moderately differentiated adenocarcinoma in the distal one third of the rectum.  0 out of 9 lymph nodes were positive.  Did not receive any adjuvant chemoradiation therapy.  Denies any bleeding into the ileostomy bag.  Denies any fevers, night sweats or weight loss.  Denies any recent hospitalizations.  A PET CT scan on 01/09/2014 prior to his definitive surgery was also negative for metastatic disease.  His last colonoscopy on 03/22/2018 shows diverticulosis in the transverse and ascending colon.  Stoma was within normal limits.  A CT scan of the abdomen and pelvis in June 2018 was negative for any metastatic disease.  There was a large peristomal hernia which was later surgically repaired in August 2018.  Since then he has some abdominal pains.  Energy levels are 75% and appetite is 100%.  Denies any new onset pains.  He works on a farm and raises cattle.  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 Family history was significant for sister having colon cancer at age 76 and dying of it.  MEDICAL HISTORY:  Past Medical History:  Diagnosis Date  . Arthritis   . Aspiration pneumonia ([place]) 03/24/2014  . Colostomy in place [facility])   . Difficulty urinating   . GERD (gastroesophageal reflux disease)   . Hypertension   . Ileostomy present ([place])   . Rectal cancer, uT2uN0, s/p LAR/ileostomy 03/20/2014 colostomy Aug2015 12/25/2013   TCS MAY 2015   . Shortening, leg, congenital    Left  . Skin irritation    Surrounding ostomy site    SURGICAL HISTORY: Past Surgical History:  Procedure Laterality Date  . COLON RESECTION N/A 03/24/2014   Procedure: Diagnostic  laparotomy, takedown of coloanal anastamosis, end colostomy, with wound vac;  Surgeon: Adin Hector, MD;  Location: [facility];  Service: General;  Laterality: N/A;  . COLONOSCOPY N/A 12/22/2013   SLF: 1. Normal mucosa in the terminal ilium. 2. Moderate diverticulosis in the transverse colon, descending colon, and sigmoid colon. 3. Rectal bleeding due ot rectal mass and hemorrhoids 4. Moderate sized internal hemorrhoids.   . COLONOSCOPY N/A 01/19/2015   Procedure: COLONOSCOPY;  Surgeon: Danie Binder, MD;  [facility];  Service: Endoscopy;  Laterality: N/A;  1215  . COLONOSCOPY N/A 03/22/2018   Procedure: COLONOSCOPY;  Surgeon: Danie Binder, MD;  Location: [facility];  Service: Endoscopy;  Laterality: N/A;  2:15pm  . COLOSTOMY    . ESOPHAGOGASTRODUODENOSCOPY N/A 12/22/2013   SLF: Probable Candia esophagitis2. Probable Barretts esphagus 3. Mild gastritis.   Marland Kitchen  EUS N/A 01/01/2014   Procedure: LOWER ENDOSCOPIC ULTRASOUND (EUS);  Surgeon: Milus Banister, MD;  Location: [facility];  Service: Endoscopy;  Laterality: N/A;  . EXAMINATION UNDER ANESTHESIA  08/25/2014   Procedure: EXAM UNDER ANESTHESIA;  Surgeon: Michael Boston, MD;  Location:  [facility];  Service: General;;  . eye reattachment and ear reattachment  Right 1971  . FLEXIBLE SIGMOIDOSCOPY N/A 03/24/2014   Procedure: FLEXIBLE SIGMOIDOSCOPY;  Surgeon: Adin Hector, MD;  Location: [facility];  Service: General;  Laterality: N/A;  . HIP FRACTURE SURGERY Left age 65 on 04-06-1970  . ILEOSTOMY    . ILEOSTOMY CLOSURE N/A 08/25/2014   Procedure: TAKEDOWN OF LOOP ILEOSTOMY ;  Surgeon: Michael Boston, MD;  Location: [facility];  Service: General;  Laterality: N/A;  . INSERTION OF MESH N/A 04/11/2017   Procedure: INSERTION OF MESH ;  Surgeon: Michael Boston, MD;  Location: [facility];  Service: General;  Laterality: N/A;  BILATERAL TAP BLOCK  . TOTAL HIP ARTHROPLASTY Left 11/19/2015   Procedure: LEFT TOTAL HIP ARTHROPLASTY ANTERIOR APPROACH;  Surgeon: Mcarthur Rossetti, MD;  Location: [facility];  Service: Orthopedics;  Laterality: Left;  Marland Kitchen VENTRAL HERNIA REPAIR N/A 04/11/2017   Procedure: LAPAROSCOPIC INCARCERATED PERISTOMAL AND INCARCERATED INCISIONAL HERNIA X2;  Surgeon: Michael Boston, MD;  Location: [facility];  Service: General;  Laterality: N/A;  BILATERAL TAP BLOCK    SOCIAL HISTORY: Social History   Socioeconomic History  . Marital status: Married    Spouse name: Not on file  . Number of children: Not on file  . Years of education: Not on file  . Highest education level: Not on file  Occupational History  . Not on file  Social Needs  . Financial resource strain: Not on file  . Food insecurity:    Worry: Not on file    Inability: Not on file  . Transportation needs:    Medical: Not on file    Non-medical: Not on file  Tobacco Use  . Smoking status: Former Smoker    Packs/day: 0.00    Years: 15.00    Pack years: 0.00    Types: Cigars    Last attempt to quit: 09/10/2011    Years since quitting: 6.5  . Smokeless tobacco: Never Used  Substance and Sexual Activity  . Alcohol use: No    Comment: quit years ago  . Drug use: No  . Sexual activity: Not on file  Lifestyle  . Physical  activity:    Days per week: Not on file    Minutes per session: Not on file  . Stress: Not on file  Relationships  . Social connections:    Talks on phone: Not on file    Gets together: Not on file    Attends religious service: Not on file    Active member of club or organization: Not on file    Attends meetings of clubs or organizations: Not on file    Relationship status: Not on file  . Intimate partner violence:    Fear of current or ex partner: Not on file    Emotionally abused: Not on file    Physically abused: Not on file    Forced sexual activity: Not on file  Other Topics Concern  . 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    FAMILY HISTORY: Family History  Problem Relation Age of Onset  . Colon cancer Sister   . Emphysema Mother   .  Heart attack Brother   . Colon polyps Neg Hx     ALLERGIES:  has No Known Allergies.  MEDICATIONS:  Current Outpatient Medications  Medication Sig Dispense Refill  . aspirin EC 81 MG tablet Take 81 mg by mouth daily.    . LUTEIN PO Take 1 tablet by mouth 2 (two) times daily.     . metoprolol tartrate (LOPRESSOR) 25 MG tablet Take 0.5 tablets (12.5 mg total) by mouth 2 (two) times daily. 90 tablet 0  . Multiple Vitamins-Minerals (ONE DAILY FOR MEN 50+ ADVANCED PO) Take 1 tablet by mouth daily.     . Omega-3 Fatty Acids (FISH OIL) 1000 MG CAPS Take 1,000 mg by mouth daily.     Marland Kitchen omeprazole (PRILOSEC) 40 MG capsule TAKE 1 CAPSULE DAILY 90 capsule 1  . OVER THE COUNTER MEDICATION Take 1 tablet by mouth 2 (two) times daily. Vinegar tablet by mouth twice a day     . saw palmetto 500 MG capsule Take 500 mg by mouth 2 (two) times daily.    . Turmeric 500 MG CAPS Take 500 mg by mouth 2 (two) times daily.     . vitamin A 25000 UNIT capsule Take 25,000 Units by mouth daily.    . vitamin B-12 (CYANOCOBALAMIN) 500 MCG tablet Take 1,000 mcg by mouth daily.    . vitamin C (ASCORBIC ACID) 500 MG tablet Take 500  mg by mouth 2 (two) times daily.     No current facility-administered medications for this visit.     REVIEW OF SYSTEMS:   Constitutional: Denies fevers, chills or abnormal night sweats Eyes: Denies blurriness of vision, double vision or watery eyes Ears, nose, mouth, throat, and face: Denies mucositis or sore throat Respiratory: Denies cough, dyspnea or wheezes.  Dyspnea on exertion. Cardiovascular: Denies palpitation, chest discomfort or lower extremity swelling Gastrointestinal:  Denies nausea, heartburn or change in bowel habits Skin: Denies abnormal skin rashes Lymphatics: Denies new lymphadenopathy or easy bruising Neurological:Denies numbness, tingling or new weaknesses Behavioral/Psych: Mood is stable, no new changes  All other systems were reviewed with the patient and are negative.  PHYSICAL EXAMINATION: ECOG PERFORMANCE STATUS: 1 - Symptomatic but completely ambulatory  Vitals:   04/03/18 1312  BP: (!) 133/95  Pulse: 66  Resp: 18  Temp: 97.9 F (36.6 C)  SpO2: 97%   Filed Weights   04/03/18 1312  Weight: 196 lb 8 oz (89.1 kg)    GENERAL:alert, no distress and comfortable SKIN: skin color, texture, turgor are normal, no rashes or significant lesions EYES: normal, conjunctiva are pink and non-injected, sclera clear OROPHARYNX:no exudate, no erythema and lips, buccal mucosa, and tongue normal  NECK: supple, thyroid normal size, non-tender, without nodularity LYMPH:  no palpable lymphadenopathy in the cervical, axillary or inguinal LUNGS: clear to auscultation and percussion with normal breathing effort HEART: regular rate & rhythm and no murmurs and no lower extremity edema ABDOMEN:abdomen soft, non-tender and normal bowel sounds.  Left lower quadrant ileostomy present. Musculoskeletal:no cyanosis of digits and no clubbing  PSYCH: alert & oriented x 3 with fluent speech   LABORATORY DATA:  I have reviewed the data as listed Lab Results  Component Value Date    WBC 8.1 03/14/2018   HGB 14.9 03/14/2018   HCT 44.7 03/14/2018   MCV 91 03/14/2018   PLT 322 03/14/2018     Chemistry      Component Value Date/Time   NA 138 03/14/2018 0921   K 4.8 03/14/2018 9150  CL 102 03/14/2018 0921   CO2 22 03/14/2018 0921   BUN 25 03/14/2018 0921   CREATININE 1.04 03/14/2018 0921   CREATININE 0.89 12/25/2013 1221      Component Value Date/Time   CALCIUM 9.2 03/14/2018 0921   ALKPHOS 75 03/14/2018 0921   AST 12 03/14/2018 0921   ALT 14 03/14/2018 0921   BILITOT 0.3 03/14/2018 0921       RADIOGRAPHIC STUDIES: I have personally reviewed the radiological images as listed and agreed with the findings in the report. Nm Myocar Multi W/spect W/wall Motion / Ef  Result Date: 03/27/2018  There was no ST segment deviation noted during stress.  Findings consistent with prior inferior/apical myocardial infarction with mild peri-infarct ischemia.  The left ventricular ejection fraction is low normal (50%).  This is a low risk study.     ASSESSMENT & PLAN:  Rectal cancer, uT2uN0, s/p LAR/ileostomy 03/20/2014 colostomy Aug2015 1.  Stage II (PT 2 PN 0) rectal adenocarcinoma: - Status post low anterior resection with ileostomy on 03/20/2014, pathology showing grade 2 moderately differentiated adenocarcinoma involving distal one third of the rectum, MMR normal. -PET CT scan prior to surgical resection on 01/09/2014- for metastatic disease. - Colonoscopy on 03/22/2018 shows diverticulosis in the transverse colon and in the ascending colon.  Has close to 35 cm of colon remaining.  Surgical stoma is normal. -Patient denies any bleeding into his ileostomy bag. -CT scan of the abdomen and pelvis on 01/24/2017 did not show any evidence of metastatic disease.  There is a large periosteal hernia which was repaired in August 2018. - He did not follow-up with medical oncologist after his surgery.  He is 4 years from his low anterior resection. -I have recommended a CT scan of  the abdomen and pelvis and a CEA level.  We will see him back after the scans and blood work. -Family history significant for sister having colon cancer at age 67 and died of it.  Orders Placed This Encounter  Procedures  . CT Abdomen Pelvis W Contrast    Standing Status:   Future    Standing Expiration Date:   04/03/2019    Order Specific Question:   ** REASON FOR EXAM (FREE TEXT)    Answer:   colon cancer    Order Specific Question:   If indicated for the ordered procedure, I authorize the administration of contrast media per Radiology protocol    Answer:   Yes    Order Specific Question:   Preferred imaging location?    Answer:   [facility]    Order Specific Question:   Is Oral Contrast requested for this exam?    Answer:   Yes, Per Radiology protocol    Order Specific Question:   Radiology Contrast Protocol - do NOT remove file path    Answer:   [email or web address]  . CEA    Standing Status:   Future    Number of Occurrences:   1    Standing Expiration Date:   04/03/2019    All questions were answered. The patient knows to call the clinic with any problems, questions or concerns.      Derek Jack, MD 04/03/2018 5:03 PM

## 2018-04-03 NOTE — Assessment & Plan Note (Signed)
1.  Stage II (PT 2 PN 0) rectal adenocarcinoma: - Status post low anterior resection with ileostomy on 03/20/2014, pathology showing grade 2 moderately differentiated adenocarcinoma involving distal one third of the rectum, MMR normal. -PET CT scan prior to surgical resection on 01/09/2014- for metastatic disease. - Colonoscopy on 03/22/2018 shows diverticulosis in the transverse colon and in the ascending colon.  Has close to 35 cm of colon remaining.  Surgical stoma is normal. -Patient denies any bleeding into his ileostomy bag. -CT scan of the abdomen and pelvis on 01/24/2017 did not show any evidence of metastatic disease.  There is a large periosteal hernia which was repaired in August 2018. - He did not follow-up with medical oncologist after his surgery.  He is 4 years from his low anterior resection. -I have recommended a CT scan of the abdomen and pelvis and a CEA level.  We will see him back after the scans and blood work. -Family history significant for sister having colon cancer at age 66 and died of it.

## 2018-04-03 NOTE — Patient Instructions (Signed)
[place] at [facility] Discharge Instructions  Follow up in 1-2 weeks after you CT SCAN and blood work   Thank you for choosing [place] at [facility] to provide your oncology and hematology care.  To afford each patient quality time with our provider, please arrive at least 15 minutes before your scheduled appointment time.   If you have a lab appointment with the [facility] please come in thru the  Main Entrance and check in at the main information desk  You need to re-schedule your appointment should you arrive 10 or more minutes late.  We strive to give you quality time with our providers, and arriving late affects you and other patients whose appointments are after yours.  Also, if you no show three or more times for appointments you may be dismissed from the clinic at the providers discretion.     Again, thank you for choosing [facility].  Our hope is that these requests will decrease the amount of time that you wait before being seen by our physicians.       _____________________________________________________________  Should you have questions after your visit to [facility], please contact our office at [phone] between the hours of 8:00 a.m. and 4:30 p.m.  Voicemails left after 4:00 p.m. will not be returned until the following business day.  For prescription refill requests, have your pharmacy contact our office and allow 72 hours.    Cancer Center Support Programs:   > Cancer Support Group  2nd Tuesday of the month 1pm-2pm, Journey Room

## 2018-04-04 LAB — CEA: CEA: 3.8 ng/mL (ref 0.0–4.7)

## 2018-04-16 ENCOUNTER — Ambulatory Visit (HOSPITAL_COMMUNITY)
Admission: RE | Admit: 2018-04-16 | Discharge: 2018-04-16 | Disposition: A | Discharge status: Home / Self Care | Payer: Medicare HMO | Source: Ambulatory Visit | Attending: Nurse Practitioner | Admitting: Nurse Practitioner

## 2018-04-16 DIAGNOSIS — C20 Malignant neoplasm of rectum: Secondary | ICD-10-CM | POA: Insufficient documentation

## 2018-04-16 DIAGNOSIS — R109 Unspecified abdominal pain: Secondary | ICD-10-CM | POA: Diagnosis not present

## 2018-04-16 MED ORDER — IOPAMIDOL (ISOVUE-300) INJECTION 61%
100.0000 mL | Freq: Once | INTRAVENOUS | Status: AC | PRN
  Administered 2018-04-16: 100 mL via INTRAVENOUS

## 2018-04-18 ENCOUNTER — Other Ambulatory Visit: Payer: Self-pay

## 2018-04-18 ENCOUNTER — Inpatient Hospital Stay (HOSPITAL_COMMUNITY): Payer: Medicare HMO | Admitting: Hematology

## 2018-04-18 ENCOUNTER — Encounter (HOSPITAL_COMMUNITY): Payer: Self-pay | Admitting: Hematology

## 2018-04-18 VITALS — BP 181/92 | HR 48 | Temp 98.2°F | Resp 18 | Wt 201.4 lb

## 2018-04-18 DIAGNOSIS — I10 Essential (primary) hypertension: Secondary | ICD-10-CM | POA: Diagnosis not present

## 2018-04-18 DIAGNOSIS — Z7982 Long term (current) use of aspirin: Secondary | ICD-10-CM

## 2018-04-18 DIAGNOSIS — Z932 Ileostomy status: Secondary | ICD-10-CM | POA: Diagnosis not present

## 2018-04-18 DIAGNOSIS — C20 Malignant neoplasm of rectum: Secondary | ICD-10-CM | POA: Diagnosis not present

## 2018-04-18 DIAGNOSIS — K5730 Diverticulosis of large intestine without perforation or abscess without bleeding: Secondary | ICD-10-CM

## 2018-04-18 DIAGNOSIS — M1990 Unspecified osteoarthritis, unspecified site: Secondary | ICD-10-CM | POA: Diagnosis not present

## 2018-04-18 DIAGNOSIS — Z79899 Other long term (current) drug therapy: Secondary | ICD-10-CM | POA: Diagnosis not present

## 2018-04-18 DIAGNOSIS — Z800 Family history of malignant neoplasm of digestive organs: Secondary | ICD-10-CM | POA: Diagnosis not present

## 2018-04-18 DIAGNOSIS — Z87891 Personal history of nicotine dependence: Secondary | ICD-10-CM | POA: Diagnosis not present

## 2018-04-18 DIAGNOSIS — K219 Gastro-esophageal reflux disease without esophagitis: Secondary | ICD-10-CM | POA: Diagnosis not present

## 2018-04-18 NOTE — Progress Notes (Signed)
[place] [place], [place] [address]   CLINIC:  Medical Oncology/Hematology  PCP:  [name], [place] [address] [phone]   REASON FOR VISIT:  Follow-up for rectal cancer  CURRENT THERAPY: Surveillance. BRIEF ONCOLOGIC HISTORY:    Rectal cancer, uT2uN0, s/p LAR/ileostomy 03/20/2014 colostomy Aug2015   12/25/2013 Initial Diagnosis    Rectal cancer, posterior 4cm from anal verge      CANCER STAGING: Cancer Staging Rectal cancer, uT2uN0, s/p LAR/ileostomy 03/20/2014 colostomy Aug2015 Staging form: Colon and Rectum, AJCC 7th Edition - Clinical: T2, N0 - Unsigned    INTERVAL HISTORY:  [name] 62 y.o. male returns for routine follow-up for rectal cancer. Patient is having some SOB with exertion. He attributes this to the 30 pound weight gain after his hernia surgery. He is trying to loose the extra weight by walking daily. He has numbness and pain in his joints of his hands and feet that is due to arthritis. Patient appetite is 100% his energy level is 25%. Patient denies any new pain. Denies any change in bowel habits. Denies any nausea, vomitng, or diarrhea. Denies any bleeding or blood in his stool.     REVIEW OF SYSTEMS:  Review of Systems  Constitutional: Positive for fatigue.  HENT:   Positive for mouth sores.   Respiratory: Positive for shortness of breath.   Skin: Positive for itching.  Neurological: Positive for extremity weakness and numbness.  Hematological: Bruises/bleeds easily.  All other systems reviewed and are negative.    PAST MEDICAL/SURGICAL HISTORY:  Past Medical History:  Diagnosis Date  . Arthritis   . Aspiration pneumonia ([place]) 03/24/2014  . Colostomy in place [facility])   . Difficulty urinating   . GERD (gastroesophageal reflux disease)   . Hypertension   . Ileostomy present ([place])   . Rectal cancer, uT2uN0, s/p LAR/ileostomy 03/20/2014 colostomy Aug2015 12/25/2013   TCS MAY  2015   . Shortening, leg, congenital    Left  . Skin irritation    Surrounding ostomy site   Past Surgical History:  Procedure Laterality Date  . COLON RESECTION N/A 03/24/2014   Procedure: Diagnostic  laparotomy, takedown of coloanal anastamosis, end colostomy, with wound vac;  Surgeon: Adin Hector, MD;  Location: [facility];  Service: General;  Laterality: N/A;  . COLONOSCOPY N/A 12/22/2013   SLF: 1. Normal mucosa in the terminal ilium. 2. Moderate diverticulosis in the transverse colon, descending colon, and sigmoid colon. 3. Rectal bleeding due ot rectal mass and hemorrhoids 4. Moderate sized internal hemorrhoids.   . COLONOSCOPY N/A 01/19/2015   Procedure: COLONOSCOPY;  Surgeon: Danie Binder, MD;  Location: [facility];  Service: Endoscopy;  Laterality: N/A;  1215  . COLONOSCOPY N/A 03/22/2018   Procedure: COLONOSCOPY;  Surgeon: Danie Binder, MD;  [facility];  Service: Endoscopy;  Laterality: N/A;  2:15pm  . COLOSTOMY    . ESOPHAGOGASTRODUODENOSCOPY N/A 12/22/2013   SLF: Probable Candia esophagitis2. Probable Barretts esphagus 3. Mild gastritis.   . EUS N/A 01/01/2014   Procedure: LOWER ENDOSCOPIC ULTRASOUND (EUS);  Surgeon: Milus Banister, MD;  Location: [facility];  Service: Endoscopy;  Laterality: N/A;  . EXAMINATION UNDER ANESTHESIA  08/25/2014   Procedure: EXAM UNDER ANESTHESIA;  Surgeon: Michael Boston, MD;  Location: [facility];  Service: General;;  . eye reattachment and ear reattachment  Right 1971  . FLEXIBLE SIGMOIDOSCOPY N/A 03/24/2014   Procedure: FLEXIBLE SIGMOIDOSCOPY;  Surgeon: Remo Lipps  C. Gross, MD;  Location: [facility];  Service: General;  Laterality: N/A;  . HIP FRACTURE SURGERY Left age 56 on 04-06-1970  . ILEOSTOMY    . ILEOSTOMY CLOSURE N/A 08/25/2014   Procedure: TAKEDOWN OF LOOP ILEOSTOMY ;  Surgeon: Michael Boston, MD;  [facility]: [facility];  Service: General;  Laterality: N/A;  . INSERTION OF MESH N/A 04/11/2017   Procedure: INSERTION OF MESH ;  Surgeon: Michael Boston, MD;  [facility]: [facility];  Service: General;  Laterality: N/A;  BILATERAL TAP BLOCK  . TOTAL HIP ARTHROPLASTY Left 11/19/2015   Procedure: LEFT TOTAL HIP ARTHROPLASTY ANTERIOR APPROACH;  Surgeon: Mcarthur Rossetti, MD;  Location: [facility];  Service: Orthopedics;  Laterality: Left;  Marland Kitchen VENTRAL HERNIA REPAIR N/A 04/11/2017   Procedure: LAPAROSCOPIC INCARCERATED PERISTOMAL AND INCARCERATED INCISIONAL HERNIA X2;  Surgeon: Michael Boston, MD;  Location: [facility];  Service: General;  Laterality: N/A;  BILATERAL TAP BLOCK     SOCIAL HISTORY:  Social History   Socioeconomic History  . Marital status: Married    Spouse name: Not on file  . Number of children: Not on file  . Years of education: Not on file  . Highest education level: Not on file  Occupational History  . Not on file  Social Needs  . Financial resource strain: Not on file  . Food insecurity:    Worry: Not on file    Inability: Not on file  . Transportation needs:    Medical: Not on file    Non-medical: Not on file  Tobacco Use  . Smoking status: Former Smoker    Packs/day: 0.00    Years: 15.00    Pack years: 0.00    Types: Cigars    Last attempt to quit: 09/10/2011    Years since quitting: 6.6  . Smokeless tobacco: Never Used  Substance and Sexual Activity  . Alcohol use: No    Comment: quit years ago  . Drug use: No  . Sexual activity: Not on file  Lifestyle  . Physical activity:    Days per week: Not on file    Minutes per session: Not on file  . Stress: Not on file  Relationships  . Social connections:    Talks on phone: Not on file    Gets together: Not on file    Attends religious service: Not on file    Active member of club or organization: Not on file    Attends meetings of clubs or organizations: Not on file    Relationship status: Not on file  . Intimate partner violence:    Fear of current or ex partner: Not on file    Emotionally abused: Not on file    Physically abused: Not on file     Forced sexual activity: Not on file  Other Topics Concern  . 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    FAMILY HISTORY:  Family History  Problem Relation Age of Onset  . Colon cancer Sister   . Emphysema Mother   . Heart attack Brother   . Colon polyps Neg Hx     CURRENT MEDICATIONS:  Outpatient Encounter Medications as of 04/18/2018  Medication Sig  . aspirin EC 81 MG tablet Take 81 mg by mouth daily.  . LUTEIN PO Take 1 tablet by mouth 2 (two) times daily.   . metoprolol tartrate (LOPRESSOR) 25 MG tablet Take 0.5 tablets (12.5 mg total) by mouth 2 (  two) times daily.  . Multiple Vitamins-Minerals (ONE DAILY FOR MEN 50+ ADVANCED PO) Take 1 tablet by mouth daily.   . Omega-3 Fatty Acids (FISH OIL) 1000 MG CAPS Take 1,000 mg by mouth daily.   Marland Kitchen omeprazole (PRILOSEC) 40 MG capsule TAKE 1 CAPSULE DAILY  . OVER THE COUNTER MEDICATION Take 1 tablet by mouth 2 (two) times daily. Vinegar tablet by mouth twice a day   . saw palmetto 500 MG capsule Take 500 mg by mouth 2 (two) times daily.  . Turmeric 500 MG CAPS Take 500 mg by mouth 2 (two) times daily.   . vitamin A 25000 UNIT capsule Take 25,000 Units by mouth daily.  . vitamin B-12 (CYANOCOBALAMIN) 500 MCG tablet Take 1,000 mcg by mouth daily.  . vitamin C (ASCORBIC ACID) 500 MG tablet Take 500 mg by mouth 2 (two) times daily.   No facility-administered encounter medications on file as of 04/18/2018.     ALLERGIES:  No Known Allergies   PHYSICAL EXAM:  ECOG Performance status: 1  VITAL SIGNS:BP: 181/92, P:48, R:18, TEMP:98.2, SATS:99% Weight:201.6  Physical Exam  Constitutional: He is oriented to person, place, and time. He appears well-developed and well-nourished.  Cardiovascular: Normal rate, regular rhythm and normal heart sounds.  Pulmonary/Chest: Effort normal and breath sounds normal.  Musculoskeletal: Normal range of motion.  Neurological: He is alert and oriented to person,  place, and time.  Skin: Skin is warm and dry.  Abdomen: No palpable organomegaly.   LABORATORY DATA:  I have reviewed the labs as listed.  CBC    Component Value Date/Time   WBC 8.1 03/14/2018 0921   WBC 7.6 04/02/2017 0937   RBC 4.89 03/14/2018 0921   RBC 4.72 04/02/2017 0937   HGB 14.9 03/14/2018 0921   HCT 44.7 03/14/2018 0921   PLT 322 03/14/2018 0921   MCV 91 03/14/2018 0921   MCH 30.5 03/14/2018 0921   MCH 31.6 04/02/2017 0937   MCHC 33.3 03/14/2018 0921   MCHC 34.3 04/02/2017 0937   RDW 13.1 03/14/2018 0921   LYMPHSABS 1.4 03/14/2018 0921   MONOABS 1.0 04/09/2014 0902   EOSABS 0.3 03/14/2018 0921   BASOSABS 0.0 03/14/2018 0921   CMP Latest Ref Rng & Units 03/14/2018 04/02/2017 03/14/2017  Glucose 65 - 99 mg/dL 90 99 95  BUN 8 - 27 mg/dL 25 27(H) 25  Creatinine 0.76 - 1.27 mg/dL 1.04 0.91 0.95  Sodium 134 - 144 mmol/L 138 139 140  Potassium 3.5 - 5.2 mmol/L 4.8 4.5 5.4(H)  Chloride 96 - 106 mmol/L 102 108 103  CO2 20 - 29 mmol/L '22 25 26  ' Calcium 8.6 - 10.2 mg/dL 9.2 8.7(L) 9.5  Total Protein 6.0 - 8.5 g/dL 6.2 - 6.2  Total Bilirubin 0.0 - 1.2 mg/dL 0.3 - 0.3  Alkaline Phos 39 - 117 IU/L 75 - 71  AST 0 - 40 IU/L 12 - 15  ALT 0 - 44 IU/L 14 - 14       DIAGNOSTIC IMAGING:  I have independently reviewed CT scan images and discussed with patient.    ASSESSMENT & PLAN:   Rectal cancer, uT2uN0, s/p LAR/ileostomy 03/20/2014 colostomy Aug2015 1.  Stage II (PT 2 PN 0) rectal adenocarcinoma: - Status post low anterior resection with ileostomy on 03/20/2014, pathology showing grade 2 moderately differentiated adenocarcinoma involving distal one third of the rectum, MMR normal. -PET CT scan prior to surgical resection on 01/09/2014- for metastatic disease. - Colonoscopy on 03/22/2018 shows diverticulosis in the transverse  colon and in the ascending colon.  Has close to 35 cm of colon remaining.  Surgical stoma is normal. -Patient denies any bleeding into his ileostomy  bag. -CT scan of the abdomen and pelvis on 01/24/2017 did not show any evidence of metastatic disease.  There is a large periosteal hernia which was repaired in August 2018. - He did not follow-up with medical oncologist after his surgery.  He is 4 years from his low anterior resection. -Family history significant for sister having colon cancer at age 42 and died of it. -I have reviewed the results of the CT scan of the abdomen and pelvis which did not show any evidence of malignancy.  It did show slight improvement in the periosteal hernia. - His CEA was also within normal limits.  LFTs were normal.  We will see him back in 6 months with blood work including CEA.  I will repeat one more last CT scan in a year from now. -He complained of shortness of breath on exertion.  He apparently gained about 30 to 35 pounds since last year.  He was told to get back to his baseline weight which could improve his shortness of breath.      Orders placed this encounter:  Orders Placed This Encounter  Procedures  . CBC with Differential/Platelet  . Comprehensive metabolic panel  . TSH  . CEA      Derek Jack, MD [place] [phone]

## 2018-04-18 NOTE — Patient Instructions (Signed)
[facility] at [facility] Discharge Instructions     Thank you for choosing [facility] at [facility] to provide your oncology and hematology care.  To afford each patient quality time with our provider, please arrive at least 15 minutes before your scheduled appointment time.   If you have a lab appointment with the Cancer Center please come in thru the  Main Entrance and check in at the main information desk  You need to re-schedule your appointment should you arrive 10 or more minutes late.  We strive to give you quality time with our providers, and arriving late affects you and other patients whose appointments are after yours.  Also, if you no show three or more times for appointments you may be dismissed from the clinic at the providers discretion.     Again, thank you for choosing [facility].  Our hope is that these requests will decrease the amount of time that you wait before being seen by our physicians.       _____________________________________________________________  Should you have questions after your visit to [facility], please contact our office at [phone] between the hours of 8:00 a.m. and 4:30 p.m.  Voicemails left after 4:00 p.m. will not be returned until the following business day.  For prescription refill requests, have your pharmacy contact our office and allow 72 hours.    Cancer Center Support Programs:   > Cancer Support Group  2nd Tuesday of the month 1pm-2pm, Journey Room    

## 2018-04-18 NOTE — Assessment & Plan Note (Signed)
1.  Stage II (PT 2 PN 0) rectal adenocarcinoma: - Status post low anterior resection with ileostomy on 03/20/2014, pathology showing grade 2 moderately differentiated adenocarcinoma involving distal one third of the rectum, MMR normal. -PET CT scan prior to surgical resection on 01/09/2014- for metastatic disease. - Colonoscopy on 03/22/2018 shows diverticulosis in the transverse colon and in the ascending colon.  Has close to 35 cm of colon remaining.  Surgical stoma is normal. -Patient denies any bleeding into his ileostomy bag. -CT scan of the abdomen and pelvis on 01/24/2017 did not show any evidence of metastatic disease.  There is a large periosteal hernia which was repaired in August 2018. - He did not follow-up with medical oncologist after his surgery.  He is 4 years from his low anterior resection. -Family history significant for sister having colon cancer at age 89 and died of it. -I have reviewed the results of the CT scan of the abdomen and pelvis which did not show any evidence of malignancy.  It did show slight improvement in the periosteal hernia. - His CEA was also within normal limits.  LFTs were normal.  We will see him back in 6 months with blood work including CEA.  I will repeat one more last CT scan in a year from now. -He complained of shortness of breath on exertion.  He apparently gained about 30 to 35 pounds since last year.  He was told to get back to his baseline weight which could improve his shortness of breath.

## 2018-08-09 ENCOUNTER — Encounter (HOSPITAL_COMMUNITY): Payer: Self-pay | Admitting: Emergency Medicine

## 2018-08-09 ENCOUNTER — Other Ambulatory Visit: Payer: Self-pay

## 2018-08-09 ENCOUNTER — Emergency Department (HOSPITAL_COMMUNITY)
Admission: EM | Admit: 2018-08-09 | Discharge: 2018-08-09 | Disposition: A | Discharge status: Home / Self Care | Payer: Medicare HMO | Attending: Emergency Medicine | Admitting: Emergency Medicine

## 2018-08-09 DIAGNOSIS — I889 Nonspecific lymphadenitis, unspecified: Secondary | ICD-10-CM | POA: Diagnosis not present

## 2018-08-09 DIAGNOSIS — I10 Essential (primary) hypertension: Secondary | ICD-10-CM | POA: Diagnosis not present

## 2018-08-09 DIAGNOSIS — Z79899 Other long term (current) drug therapy: Secondary | ICD-10-CM | POA: Diagnosis not present

## 2018-08-09 DIAGNOSIS — Z7982 Long term (current) use of aspirin: Secondary | ICD-10-CM | POA: Diagnosis not present

## 2018-08-09 DIAGNOSIS — Z87891 Personal history of nicotine dependence: Secondary | ICD-10-CM | POA: Insufficient documentation

## 2018-08-09 DIAGNOSIS — L040 Acute lymphadenitis of face, head and neck: Secondary | ICD-10-CM | POA: Diagnosis not present

## 2018-08-09 DIAGNOSIS — B029 Zoster without complications: Secondary | ICD-10-CM | POA: Diagnosis not present

## 2018-08-09 DIAGNOSIS — M542 Cervicalgia: Secondary | ICD-10-CM | POA: Diagnosis present

## 2018-08-09 MED ORDER — HYDROCODONE-ACETAMINOPHEN 5-325 MG PO TABS: 1.0000 | ORAL_TABLET | ORAL | 0 refills | Status: DC | PRN

## 2018-08-09 MED ORDER — ACYCLOVIR 800 MG PO TABS: 800.0000 mg | ORAL_TABLET | Freq: Every day | ORAL | 0 refills | Status: AC

## 2018-08-09 NOTE — ED Triage Notes (Signed)
Patient complaining of pain to back of neck yesterday and he noticed a "knot" to back of neck this morning.

## 2018-08-09 NOTE — Discharge Instructions (Addendum)
The entire course of acyclovir which is the antiviral medicine which should help stop the suspected shingles flareup.  Increase your naproxen to twice a day which can help reduce the pain and swelling of this occipital lymph node.  Heating pad applied to the lymph node also may help reduce pain and swelling.  As discussed you should avoid anyone who is immunocompromised such as actively getting cancer treatment for his HIV and also any child who is not been immunized for chickenpox and also pregnant women.  Plan to see your doctor for recheck in a week.  Get repeat checked immediately if you have any pain or rash that starts to develop on your forehead or around your eye particularly.  Drive within 4 hours of taking hydrocodone as this medication will make you drowsy.

## 2018-08-09 NOTE — ED Provider Notes (Signed)
[facility] Provider Note   CSN: [id number] Arrival date & time: 08/09/18  1301     History   Chief Complaint Chief Complaint  Patient presents with  . Neck Pain    HPI [name] is a 62 y.o. male with past medical history as outlined below, presenting with left posterior neck pain and swelling along with lancing intermittent pain that radiates up his left lateral scalp and a rash in his hairline.  His symptoms started yesterday.  He has had no fevers or chills there is been no drainage from the rash.  He states the rash is itching but also burning in character.  He also states he has had a similar rash in his scalp in the past but never associated with this severe pain.  There is no involvement of pain or rash in his face, the pain radiates to his left frontal scalp.  He does endorse having chickenpox as a child.  Denies history of shingles.  He has had no treatment specifically for this prior to arrival.  He does take naproxen 500 mg once daily due to arthritis pain.  HPI  Past Medical History:  Diagnosis Date  . Arthritis   . Aspiration pneumonia ([place]) 03/24/2014  . Colostomy in place [facility])   . Difficulty urinating   . GERD (gastroesophageal reflux disease)   . Hypertension   . Ileostomy present ([place])   . Rectal cancer, uT2uN0, s/p LAR/ileostomy 03/20/2014 colostomy [id number] 12/25/2013   TCS MAY 2015   . Shortening, leg, congenital    Left  . Skin irritation    Surrounding ostomy site    Patient Active Problem List   Diagnosis Date Noted  . DOE (dyspnea on exertion) 02/20/2018  . Incarcerated incisional hernia s/p lap repair with mesh 04/11/2017 04/11/2017  . Incarcerated parastomal hernia s/p lap repair with mesh 04/11/2017 01/17/2017  . Presence of left artificial hip joint 11/09/2016  . Osteoarthritis of left hip 11/19/2015  . Avascular necrosis of bone of left hip ([place]) 10/06/2015  . Acquired inequality of length of lower legs 10/06/2015  .  Colostomy in place [facility])   . Essential hypertension, benign 05/12/2014  . Thrombocytosis ([place]) 04/14/2014  . Eustachian tube dysfunction 04/07/2014  . Obesity (BMI 30-39.9) 03/24/2014  . Rectal cancer, uT2uN0, s/p LAR/ileostomy 03/20/2014 colostomy [id number] 12/25/2013  . GERD (gastroesophageal reflux disease) 11/27/2013  . Atypical chest pain 11/10/2013  . Tobacco abuse, in remission 11/10/2013    Past Surgical History:  Procedure Laterality Date  . COLON RESECTION N/A 03/24/2014   Procedure: Diagnostic  laparotomy, takedown of coloanal anastamosis, end colostomy, with wound vac;  Surgeon: Adin Hector, MD;  Location: [facility] ORS;  Service: General;  Laterality: N/A;  . COLONOSCOPY N/A 12/22/2013   SLF: 1. Normal mucosa in the terminal ilium. 2. Moderate diverticulosis in the transverse colon, descending colon, and sigmoid colon. 3. Rectal bleeding due ot rectal mass and hemorrhoids 4. Moderate sized internal hemorrhoids.   . COLONOSCOPY N/A 01/19/2015   Procedure: COLONOSCOPY;  Surgeon: Danie Binder, MD;  Location: [facility];  Service: Endoscopy;  Laterality: N/A;  1215  . COLONOSCOPY N/A 03/22/2018   Procedure: COLONOSCOPY;  Surgeon: Danie Binder, MD;  Location: [facility];  Service: Endoscopy;  Laterality: N/A;  2:15pm  . COLOSTOMY    . ESOPHAGOGASTRODUODENOSCOPY N/A 12/22/2013   SLF: Probable Candia esophagitis2. Probable Barretts esphagus 3. Mild gastritis.   . EUS N/A 01/01/2014   Procedure: LOWER ENDOSCOPIC  ULTRASOUND (EUS);  Surgeon: Milus Banister, MD;  Location: [facility];  Service: Endoscopy;  Laterality: N/A;  . EXAMINATION UNDER ANESTHESIA  08/25/2014   Procedure: EXAM UNDER ANESTHESIA;  Surgeon: Michael Boston, MD;  Location: [facility];  Service: General;;  . eye reattachment and ear reattachment  Right 1971  . FLEXIBLE SIGMOIDOSCOPY N/A 03/24/2014   Procedure: FLEXIBLE SIGMOIDOSCOPY;  Surgeon: Adin Hector, MD;  Location: [facility];  Service: General;  Laterality: N/A;  . HIP  FRACTURE SURGERY Left age 3 on 04-06-1970  . ILEOSTOMY    . ILEOSTOMY CLOSURE N/A 08/25/2014   Procedure: TAKEDOWN OF LOOP ILEOSTOMY ;  Surgeon: Michael Boston, MD;  Location: [facility];  Service: General;  Laterality: N/A;  . INSERTION OF MESH N/A 04/11/2017   Procedure: INSERTION OF MESH ;  Surgeon: Michael Boston, MD;  Location: [facility];  Service: General;  Laterality: N/A;  BILATERAL TAP BLOCK  . TOTAL HIP ARTHROPLASTY Left 11/19/2015   Procedure: LEFT TOTAL HIP ARTHROPLASTY ANTERIOR APPROACH;  Surgeon: Mcarthur Rossetti, MD;  Location: [facility];  Service: Orthopedics;  Laterality: Left;  Marland Kitchen VENTRAL HERNIA REPAIR N/A 04/11/2017   Procedure: LAPAROSCOPIC INCARCERATED PERISTOMAL AND INCARCERATED INCISIONAL HERNIA X2;  Surgeon: Michael Boston, MD;  Location: [facility];  Service: General;  Laterality: N/A;  BILATERAL TAP BLOCK        Home Medications    Prior to Admission medications   Medication Sig Start Date End Date Taking? Authorizing Provider  acyclovir (ZOVIRAX) 800 MG tablet Take 1 tablet (800 mg total) by mouth 5 (five) times daily for 7 days. 08/09/18 08/16/18  Evalee Jefferson, PA-C  aspirin EC 81 MG tablet Take 81 mg by mouth daily.    [provider]  HYDROcodone-acetaminophen (NORCO/VICODIN) 5-325 MG tablet Take 1 tablet by mouth every 4 (four) hours as needed. 08/09/18   Evalee Jefferson, PA-C  LUTEIN PO Take 1 tablet by mouth 2 (two) times daily.     [provider]  metoprolol tartrate (LOPRESSOR) 25 MG tablet Take 0.5 tablets (12.5 mg total) by mouth 2 (two) times daily. 03/18/18 06/16/18  Satira Sark, MD  Multiple Vitamins-Minerals (ONE DAILY FOR MEN 50+ ADVANCED PO) Take 1 tablet by mouth daily.     [provider]  Omega-3 Fatty Acids (FISH OIL) 1000 MG CAPS Take 1,000 mg by mouth daily.     [provider]  omeprazole (PRILOSEC) 40 MG capsule TAKE 1 CAPSULE DAILY 03/14/18   [name], MD  OVER THE COUNTER MEDICATION Take 1 tablet by mouth 2  (two) times daily. Vinegar tablet by mouth twice a day     [provider]  saw palmetto 500 MG capsule Take 500 mg by mouth 2 (two) times daily.    [provider]  Turmeric 500 MG CAPS Take 500 mg by mouth 2 (two) times daily.     [provider]  vitamin A 25000 UNIT capsule Take 25,000 Units by mouth daily.    [provider]  vitamin B-12 (CYANOCOBALAMIN) 500 MCG tablet Take 1,000 mcg by mouth daily.    [provider]  vitamin C (ASCORBIC ACID) 500 MG tablet Take 500 mg by mouth 2 (two) times daily.    [provider]    Family History Family History  Problem Relation Age of Onset  . Colon cancer Sister   . Emphysema Mother   . Heart attack Brother   . Colon polyps Neg Hx  Social History Social History   Tobacco Use  . Smoking status: Former Smoker    Packs/day: 0.00    Years: 15.00    Pack years: 0.00    Types: Cigars    Last attempt to quit: 09/10/2011    Years since quitting: 6.9  . Smokeless tobacco: Never Used  Substance Use Topics  . Alcohol use: No    Comment: quit years ago  . Drug use: No     Allergies   Patient has no known allergies.   Review of Systems Review of Systems  Constitutional: Negative for chills and fever.  HENT: Negative for congestion and sore throat.   Eyes: Negative.   Respiratory: Negative for chest tightness and shortness of breath.   Cardiovascular: Negative for chest pain.  Gastrointestinal: Negative for abdominal pain and nausea.  Genitourinary: Negative.   Musculoskeletal: Positive for neck pain. Negative for arthralgias and joint swelling.  Skin: Positive for rash. Negative for wound.  Neurological: Negative for dizziness, weakness, light-headedness, numbness and headaches.  Hematological: Positive for adenopathy.  Psychiatric/Behavioral: Negative.      Physical Exam Updated Vital Signs BP (!) 166/107 (BP Location: Right Arm)   Pulse 69   Temp 98.3 F (36.8 C)  (Oral)   Resp 16   Ht 5\' 5"  (1.651 m)   Wt 84.4 kg   SpO2 97%   BMI 30.95 kg/m   Physical Exam Vitals signs and nursing note reviewed.  Constitutional:      Appearance: He is well-developed.  HENT:     Head: Normocephalic and atraumatic.     Right Ear: Ear canal and external ear normal.     Left Ear: Ear canal and external ear normal.  Eyes:     General:        Right eye: No discharge.        Left eye: No discharge.     Conjunctiva/sclera: Conjunctivae normal.  Neck:     Musculoskeletal: Normal range of motion.     Comments: Left occipital adenopathy. Cardiovascular:     Rate and Rhythm: Normal rate and regular rhythm.     Heart sounds: Normal heart sounds.  Pulmonary:     Effort: Pulmonary effort is normal.     Breath sounds: Normal breath sounds. No wheezing.  Musculoskeletal: Normal range of motion.  Skin:    General: Skin is warm and dry.     Findings: Rash present.     Comments: Patient has a rash consisting of small papules and vesicles mostly within his left occipital scalp, there are few lesions in the right occiput as well.  There are several subtle round areas of macular erythema without rash tracking along the left parietal and frontal scalp in the same distribution as his reported pain.  Neurological:     Mental Status: He is alert.      ED Treatments / Results  Labs (all labs ordered are listed, but only abnormal results are displayed) Labs Reviewed - No data to display  EKG None  Radiology No results found.  Procedures Procedures (including critical care time)  Medications Ordered in ED Medications - No data to display   Initial Impression / Assessment and Plan / ED Course  I have reviewed the triage vital signs and the nursing notes.  Pertinent labs & imaging results that were available during my care of the patient were reviewed by me and considered in my medical decision making (see chart for details).     Suspect  patient has a new  onset of shingles.  He was placed on acyclovir, hydrocodone also prescribed.  Caution regarding sedation with this medication.  Discussed in effectivity and populations he needs to avoid until the rash is completely scabbed over.  Plan a recheck with his PCP in 1 week, however he was also cautioned regarding any forehead or periocular involvement, this would require immediate recheck.  Final Clinical Impressions(s) / ED Diagnoses   Final diagnoses:  Herpes zoster without complication  Occipital lymphadenitis    ED Discharge Orders         Ordered    acyclovir (ZOVIRAX) 800 MG tablet  5 times daily     08/09/18 1538    HYDROcodone-acetaminophen (NORCO/VICODIN) 5-325 MG tablet  Every 4 hours PRN     08/09/18 1538           Evalee Jefferson, PA-C 08/09/18 1549    Fredia Sorrow, MD 08/09/18 [phone]

## 2018-08-16 ENCOUNTER — Encounter: Payer: Self-pay | Admitting: Family Medicine

## 2018-08-16 ENCOUNTER — Ambulatory Visit: Payer: Medicare HMO | Admitting: Family Medicine

## 2018-08-16 VITALS — BP 150/92 | Ht 65.0 in | Wt 198.0 lb

## 2018-08-16 DIAGNOSIS — B029 Zoster without complications: Secondary | ICD-10-CM

## 2018-08-16 DIAGNOSIS — Z23 Encounter for immunization: Secondary | ICD-10-CM | POA: Diagnosis not present

## 2018-08-16 MED ORDER — ZOSTER VAC RECOMB ADJUVANTED 50 MCG/0.5ML IM SUSR
0.5000 mL | Freq: Once | INTRAMUSCULAR | 1 refills | Status: AC
Start: 2018-08-16 — End: 2018-08-16

## 2018-08-16 NOTE — Progress Notes (Signed)
   Subjective:    Patient ID: [name], male    DOB: May 11, 1956, 62 y.o.   MRN: [id number]  HPI Patient is here today for a hospital follow up on shingles.He was seen on 08/09/2018 at the ed after noticing blisters on mouth and the back of his head. They gave the pt Acyclovir 800 mg five times per day for seven days.   He states he was given something else, but he does not know what the name of it is.  Patient notes overall the blisters have improved.  He took all of his antiviral antibiotics.  States still residual pain though has considerably improved Review of Systems No chest pain no vomiting no diarrhea no abdominal pain    Objective:   Physical Exam  Alert vitals stable, NAD. Blood pressure good on repeat. HEENT normal. Lungs clear. Heart regular rate and rhythm. Healing clusters of rash left posterior cervical region.  With some hyperesthesia noted  Impression shingles.  Discussed at length.  Vaccine encouraged.  Local measures discussed.  Patient wonders about any further shingles antibiotics and this was discouraged      Assessment & Plan:

## 2018-09-16 ENCOUNTER — Encounter: Payer: Self-pay | Admitting: Family Medicine

## 2018-09-16 ENCOUNTER — Ambulatory Visit (INDEPENDENT_AMBULATORY_CARE_PROVIDER_SITE_OTHER): Payer: Medicare HMO | Admitting: Family Medicine

## 2018-09-16 VITALS — BP 152/94 | Ht 65.0 in | Wt 200.8 lb

## 2018-09-16 DIAGNOSIS — I10 Essential (primary) hypertension: Secondary | ICD-10-CM | POA: Diagnosis not present

## 2018-09-16 DIAGNOSIS — C20 Malignant neoplasm of rectum: Secondary | ICD-10-CM | POA: Diagnosis not present

## 2018-09-16 DIAGNOSIS — Z0000 Encounter for general adult medical examination without abnormal findings: Secondary | ICD-10-CM

## 2018-09-16 MED ORDER — AMLODIPINE BESYLATE 5 MG PO TABS: 5.0000 mg | ORAL_TABLET | Freq: Every day | ORAL | 3 refills | Status: DC

## 2018-09-16 NOTE — Progress Notes (Signed)
Subjective:    Patient ID: [name], male    DOB: [birth year], 63 y.o.   MRN: [id number]  HPI  The patient comes in today for a wellness visit.    A review of their health history was completed.  A review of medications was also completed.  Any needed refills; yes  Eating habits: trying to eat good  Falls/  MVA accidents in past few months: none  Regular exercise: working the farm  Specialist pt sees on regular basis:none  Preventative health issues were discussed.   Additional concerns:  Neck pain a month                                      Cramps in legs at night  Blood pressure medicine and blood pressure levels reviewed today with patient. Compliant with blood pressure medicine. States does not miss a dose. No obvious side effects. Blood pressure generally good when checked elsewhere. Watching salt intake.  Pt having more and more pain in the neck, getting worse,  Worse with riding a dump truck     Review of Systems  Constitutional: Negative for activity change, appetite change and fever.  HENT: Negative for congestion and rhinorrhea.   Eyes: Negative for discharge.  Respiratory: Negative for cough and wheezing.   Cardiovascular: Negative for chest pain.  Gastrointestinal: Negative for abdominal pain, blood in stool and vomiting.  Genitourinary: Negative for difficulty urinating and frequency.  Musculoskeletal: Negative for neck pain.  Skin: Negative for rash.  Allergic/Immunologic: Negative for environmental allergies and food allergies.  Neurological: Negative for weakness and headaches.  Psychiatric/Behavioral: Negative for agitation.  All other systems reviewed and are negative.      Objective:   Physical Exam Vitals signs reviewed.  Constitutional:      Appearance: He is well-developed.  HENT:     Head: Normocephalic and atraumatic.     Right Ear: External ear normal.     Left Ear: External ear normal.     Nose: Nose normal.  Eyes:     Pupils: Pupils are equal, round, and reactive to light.  Neck:     Musculoskeletal: Normal range of motion and neck supple.     Thyroid: No thyromegaly.  Cardiovascular:     Rate and Rhythm: Normal rate and regular rhythm.     Heart sounds: Normal heart sounds. No murmur.  Pulmonary:     Effort: Pulmonary effort is normal. No respiratory distress.     Breath sounds: Normal breath sounds. No wheezing.  Abdominal:     General: Bowel sounds are normal. There is no distension.     Palpations: Abdomen is soft. There is no mass.     Tenderness: There is no abdominal tenderness.  Genitourinary:    Penis: Normal.   Musculoskeletal: Normal range of motion.  Lymphadenopathy:     Cervical: No cervical adenopathy.  Skin:    General: Skin is warm and dry.     Findings: No erythema.  Neurological:     Mental Status: He is alert.     Motor: No abnormal muscle tone.  Psychiatric:        Behavior: Behavior normal.        Judgment: Judgment normal.    Neck positive pain with rotation positive posterior lateral tenderness bilateral to deep palpation       Assessment & Plan:  Impression wellness exam.  Diet discussed.  Exercise discussed.  Hx colon just six mo ago, next due in three yres. Flu shot done  2.  Hypertension.  Control suboptimal.  Discussed.  Higher doses of Lopressor because slow heart rate.  We will add Norvasc 5 mg daily rationale discussed   #3 chronic neck strain likely with element of arthritis.  Local measures discussed.  Preferably Tylenol may add Aleve or something similar.

## 2018-09-17 LAB — POTASSIUM: Potassium: 5 mmol/L (ref 3.5–5.2)

## 2018-09-17 LAB — MAGNESIUM: MAGNESIUM: 2.1 mg/dL (ref 1.6–2.3)

## 2018-10-03 DIAGNOSIS — I10 Essential (primary) hypertension: Secondary | ICD-10-CM | POA: Diagnosis not present

## 2018-10-03 DIAGNOSIS — Z791 Long term (current) use of non-steroidal anti-inflammatories (NSAID): Secondary | ICD-10-CM | POA: Diagnosis not present

## 2018-10-03 DIAGNOSIS — Z008 Encounter for other general examination: Secondary | ICD-10-CM | POA: Diagnosis not present

## 2018-10-03 DIAGNOSIS — K219 Gastro-esophageal reflux disease without esophagitis: Secondary | ICD-10-CM | POA: Diagnosis not present

## 2018-10-03 DIAGNOSIS — Z933 Colostomy status: Secondary | ICD-10-CM | POA: Diagnosis not present

## 2018-10-03 DIAGNOSIS — K2970 Gastritis, unspecified, without bleeding: Secondary | ICD-10-CM | POA: Diagnosis not present

## 2018-10-03 DIAGNOSIS — Z809 Family history of malignant neoplasm, unspecified: Secondary | ICD-10-CM | POA: Diagnosis not present

## 2018-10-03 DIAGNOSIS — G8929 Other chronic pain: Secondary | ICD-10-CM | POA: Diagnosis not present

## 2018-10-03 DIAGNOSIS — Z6832 Body mass index (BMI) 32.0-32.9, adult: Secondary | ICD-10-CM | POA: Diagnosis not present

## 2018-10-03 DIAGNOSIS — M1990 Unspecified osteoarthritis, unspecified site: Secondary | ICD-10-CM | POA: Diagnosis not present

## 2018-10-03 DIAGNOSIS — E669 Obesity, unspecified: Secondary | ICD-10-CM | POA: Diagnosis not present

## 2018-10-18 ENCOUNTER — Inpatient Hospital Stay (HOSPITAL_COMMUNITY): Payer: Medicare HMO | Attending: Hematology

## 2018-10-18 DIAGNOSIS — C20 Malignant neoplasm of rectum: Secondary | ICD-10-CM | POA: Insufficient documentation

## 2018-10-18 DIAGNOSIS — Z932 Ileostomy status: Secondary | ICD-10-CM | POA: Diagnosis not present

## 2018-10-18 LAB — CBC WITH DIFFERENTIAL/PLATELET
Abs Immature Granulocytes: 0.06 10*3/uL (ref 0.00–0.07)
Basophils Absolute: 0.1 10*3/uL (ref 0.0–0.1)
Basophils Relative: 1 %
EOS ABS: 0.4 10*3/uL (ref 0.0–0.5)
EOS PCT: 4 %
HEMATOCRIT: 46 % (ref 39.0–52.0)
HEMOGLOBIN: 15.1 g/dL (ref 13.0–17.0)
Immature Granulocytes: 1 %
LYMPHS ABS: 1.7 10*3/uL (ref 0.7–4.0)
LYMPHS PCT: 17 %
MCH: 30.9 pg (ref 26.0–34.0)
MCHC: 32.8 g/dL (ref 30.0–36.0)
MCV: 94.1 fL (ref 80.0–100.0)
MONO ABS: 0.7 10*3/uL (ref 0.1–1.0)
MONOS PCT: 7 %
NRBC: 0 % (ref 0.0–0.2)
Neutro Abs: 7.4 10*3/uL (ref 1.7–7.7)
Neutrophils Relative %: 70 %
Platelets: 362 10*3/uL (ref 150–400)
RBC: 4.89 MIL/uL (ref 4.22–5.81)
RDW: 13.2 % (ref 11.5–15.5)
WBC: 10.3 10*3/uL (ref 4.0–10.5)

## 2018-10-18 LAB — COMPREHENSIVE METABOLIC PANEL
ALK PHOS: 95 U/L (ref 38–126)
ALT: 27 U/L (ref 0–44)
AST: 16 U/L (ref 15–41)
Albumin: 3.8 g/dL (ref 3.5–5.0)
Anion gap: 8 (ref 5–15)
BILIRUBIN TOTAL: 0.6 mg/dL (ref 0.3–1.2)
BUN: 26 mg/dL — ABNORMAL HIGH (ref 8–23)
CHLORIDE: 103 mmol/L (ref 98–111)
CO2: 28 mmol/L (ref 22–32)
Calcium: 9.1 mg/dL (ref 8.9–10.3)
Creatinine, Ser: 0.89 mg/dL (ref 0.61–1.24)
Glucose, Bld: 96 mg/dL (ref 70–99)
POTASSIUM: 4.8 mmol/L (ref 3.5–5.1)
Sodium: 139 mmol/L (ref 135–145)
Total Protein: 6.8 g/dL (ref 6.5–8.1)

## 2018-10-18 LAB — TSH: TSH: 2.029 u[IU]/mL (ref 0.350–4.500)

## 2018-10-19 LAB — CEA: CEA1: 2.6 ng/mL (ref 0.0–4.7)

## 2018-10-25 ENCOUNTER — Ambulatory Visit (HOSPITAL_COMMUNITY): Payer: Medicare HMO | Admitting: Hematology

## 2018-10-25 ENCOUNTER — Inpatient Hospital Stay (HOSPITAL_COMMUNITY): Payer: Medicare HMO | Attending: Hematology | Admitting: Hematology

## 2018-10-25 ENCOUNTER — Encounter (HOSPITAL_COMMUNITY): Payer: Self-pay | Admitting: Hematology

## 2018-10-25 ENCOUNTER — Other Ambulatory Visit: Payer: Self-pay

## 2018-10-25 ENCOUNTER — Other Ambulatory Visit: Payer: Self-pay | Admitting: Family Medicine

## 2018-10-25 VITALS — BP 146/89 | HR 61 | Temp 98.0°F | Resp 18 | Wt 196.5 lb

## 2018-10-25 DIAGNOSIS — I10 Essential (primary) hypertension: Secondary | ICD-10-CM | POA: Diagnosis not present

## 2018-10-25 DIAGNOSIS — C20 Malignant neoplasm of rectum: Secondary | ICD-10-CM | POA: Diagnosis not present

## 2018-10-25 DIAGNOSIS — Z7982 Long term (current) use of aspirin: Secondary | ICD-10-CM | POA: Diagnosis not present

## 2018-10-25 DIAGNOSIS — K219 Gastro-esophageal reflux disease without esophagitis: Secondary | ICD-10-CM | POA: Diagnosis not present

## 2018-10-25 DIAGNOSIS — R0789 Other chest pain: Secondary | ICD-10-CM

## 2018-10-25 DIAGNOSIS — Z87891 Personal history of nicotine dependence: Secondary | ICD-10-CM | POA: Diagnosis not present

## 2018-10-25 DIAGNOSIS — Z933 Colostomy status: Secondary | ICD-10-CM

## 2018-10-25 DIAGNOSIS — Z79899 Other long term (current) drug therapy: Secondary | ICD-10-CM | POA: Insufficient documentation

## 2018-10-25 DIAGNOSIS — M1990 Unspecified osteoarthritis, unspecified site: Secondary | ICD-10-CM | POA: Insufficient documentation

## 2018-10-25 NOTE — Assessment & Plan Note (Signed)
1.  Stage II (PT 2 PN 0) rectal adenocarcinoma: - Status post low anterior resection with ileostomy on 03/20/2014, pathology showing grade 2 moderately differentiated adenocarcinoma involving distal one third of the rectum, MMR normal. -PET CT scan prior to surgical resection on 01/09/2014 negative for metastatic disease. -Takedown of loop ileostomy in January 2016. - Colonoscopy on 03/22/2018 shows diverticulosis in the transverse colon and in the ascending colon.  Has close to 35 cm of colon remaining.  Surgical stoma is normal. -He denies any bleeding into colostomy bag. -Family history significant for sister having colon cancer at age 62 and died of it. - CT of the abdomen and pelvis on 04/16/2018 shows interval reduction of peristomal hernia of small bowel.  No evidence of recurrence of his cancer. - Physical examination did not reveal any suspicious masses.  Blood work including CEA were within normal limits. - I will see him back in 6 months for follow-up with repeat CT scan and blood work.  That will be his last CT scan.

## 2018-10-25 NOTE — Progress Notes (Signed)
[place] [place], [place] [address]   CLINIC:  Medical Oncology/Hematology  PCP:  [name], [place] [address] [phone]   REASON FOR VISIT:  Follow-up for rectal cancer  CURRENT THERAPY: Surveillance. BRIEF ONCOLOGIC HISTORY:    Rectal cancer, uT2uN0, s/p LAR/ileostomy 03/20/2014 colostomy Aug2015   12/25/2013 Initial Diagnosis    Rectal cancer, posterior 4cm from anal verge      CANCER STAGING: Cancer Staging Rectal cancer, uT2uN0, s/p LAR/ileostomy 03/20/2014 colostomy Aug2015 Staging form: Colon and Rectum, AJCC 7th Edition - Clinical: T2, N0 - Unsigned    INTERVAL HISTORY:  Mr. Zerkle 63 y.o. male returns for follow-up of rectal cancer.  Denies any bleeding into the colostomy bag.  He reports tightness across the lower chest/upper abdomen like a band for the past 1 week.  Patient has been active around the house and has been lifting some heavy stuff.  He thinks that might have contributed to his pain/tightness.  Denies any changes in the bowels consistency.  Denies any nausea, vomiting.  Appetite is 100% energy levels are 75%.  Denies any fevers, hospitalizations/ER visits.    REVIEW OF SYSTEMS:  Review of Systems  Constitutional: Negative for fatigue.  HENT:   Negative for mouth sores.   Respiratory: Negative for shortness of breath.   Skin: Negative for itching.  Neurological: Negative for extremity weakness and numbness.  Hematological: Does not bruise/bleed easily.  All other systems reviewed and are negative.    PAST MEDICAL/SURGICAL HISTORY:  Past Medical History:  Diagnosis Date  . Arthritis   . Aspiration pneumonia ([place]) 03/24/2014  . Colostomy in place [facility])   . Difficulty urinating   . GERD (gastroesophageal reflux disease)   . Hypertension   . Ileostomy present ([place])   . Rectal cancer, uT2uN0, s/p LAR/ileostomy 03/20/2014 colostomy Aug2015 12/25/2013   TCS MAY 2015   .  Shortening, leg, congenital    Left  . Skin irritation    Surrounding ostomy site   Past Surgical History:  Procedure Laterality Date  . COLON RESECTION N/A 03/24/2014   Procedure: Diagnostic  laparotomy, takedown of coloanal anastamosis, end colostomy, with wound vac;  Surgeon: Adin Hector, MD;  Location: [facility];  Service: General;  Laterality: N/A;  . COLONOSCOPY N/A 12/22/2013   SLF: 1. Normal mucosa in the terminal ilium. 2. Moderate diverticulosis in the transverse colon, descending colon, and sigmoid colon. 3. Rectal bleeding due ot rectal mass and hemorrhoids 4. Moderate sized internal hemorrhoids.   . COLONOSCOPY N/A 01/19/2015   Procedure: COLONOSCOPY;  Surgeon: Danie Binder, MD;  [facility];  Service: Endoscopy;  Laterality: N/A;  1215  . COLONOSCOPY N/A 03/22/2018   Procedure: COLONOSCOPY;  Surgeon: Danie Binder, MD;  [facility];  Service: Endoscopy;  Laterality: N/A;  2:15pm  . COLOSTOMY    . ESOPHAGOGASTRODUODENOSCOPY N/A 12/22/2013   SLF: Probable Candia esophagitis2. Probable Barretts esphagus 3. Mild gastritis.   . EUS N/A 01/01/2014   Procedure: LOWER ENDOSCOPIC ULTRASOUND (EUS);  Surgeon: Milus Banister, MD;  Location: [facility];  Service: Endoscopy;  Laterality: N/A;  . EXAMINATION UNDER ANESTHESIA  08/25/2014   Procedure: EXAM UNDER ANESTHESIA;  Surgeon: Michael Boston, MD;  Location: [facility];  Service: General;;  . eye reattachment and ear reattachment  Right 1971  . FLEXIBLE SIGMOIDOSCOPY N/A 03/24/2014   Procedure: FLEXIBLE SIGMOIDOSCOPY;  Surgeon: Adin Hector, MD;  Location: [facility];  Service: General;  Laterality: N/A;  . HIP FRACTURE SURGERY Left age 49 on 04-06-1970  . ILEOSTOMY    . ILEOSTOMY CLOSURE N/A 08/25/2014   Procedure: TAKEDOWN OF LOOP ILEOSTOMY ;  Surgeon: Michael Boston, MD;  Location: [facility];  Service: General;  Laterality: N/A;  . INSERTION OF MESH N/A 04/11/2017   Procedure: INSERTION OF MESH ;  Surgeon: Michael Boston, MD;   Location[facility];  Service: General;  Laterality: N/A;  BILATERAL TAP BLOCK  . TOTAL HIP ARTHROPLASTY Left 11/19/2015   Procedure: LEFT TOTAL HIP ARTHROPLASTY ANTERIOR APPROACH;  Surgeon: Mcarthur Rossetti, MD;  Location: [facility];  Service: Orthopedics;  Laterality: Left;  Marland Kitchen VENTRAL HERNIA REPAIR N/A 04/11/2017   Procedure: LAPAROSCOPIC INCARCERATED PERISTOMAL AND INCARCERATED INCISIONAL HERNIA X2;  Surgeon: Michael Boston, MD;  Location: [facility];  Service: General;  Laterality: N/A;  BILATERAL TAP BLOCK     SOCIAL HISTORY:  Social History   Socioeconomic History  . Marital status: Married    Spouse name: Not on file  . Number of children: Not on file  . Years of education: Not on file  . Highest education level: Not on file  Occupational History  . Not on file  Social Needs  . Financial resource strain: Not on file  . Food insecurity:    Worry: Not on file    Inability: Not on file  . Transportation needs:    Medical: Not on file    Non-medical: Not on file  Tobacco Use  . Smoking status: Former Smoker    Packs/day: 0.00    Years: 15.00    Pack years: 0.00    Types: Cigars    Last attempt to quit: 09/10/2011    Years since quitting: 7.1  . Smokeless tobacco: Never Used  Substance and Sexual Activity  . Alcohol use: No    Comment: quit years ago  . Drug use: No  . Sexual activity: Not on file  Lifestyle  . Physical activity:    Days per week: Not on file    Minutes per session: Not on file  . Stress: Not on file  Relationships  . Social connections:    Talks on phone: Not on file    Gets together: Not on file    Attends religious service: Not on file    Active member of club or organization: Not on file    Attends meetings of clubs or organizations: Not on file    Relationship status: Not on file  . Intimate partner violence:    Fear of current or ex partner: Not on file    Emotionally abused: Not on file    Physically abused: Not on file    Forced sexual  activity: Not on file  Other Topics Concern  . 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    FAMILY HISTORY:  Family History  Problem Relation Age of Onset  . Colon cancer Sister   . Emphysema Mother   . Heart attack Brother   . Colon polyps Neg Hx     CURRENT MEDICATIONS:  Outpatient Encounter Medications as of 10/25/2018  Medication Sig  . co-enzyme Q-10 30 MG capsule Take 30 mg by mouth 2 (two) times daily.  . vitamin E 200 UNIT capsule Take 200 Units by mouth 2 (two) times daily.  Marland Kitchen amLODipine (NORVASC) 5 MG tablet Take 1 tablet (5 mg total) by mouth daily.  Marland Kitchen  aspirin EC 81 MG tablet Take 81 mg by mouth daily.  Marland Kitchen HYDROcodone-acetaminophen (NORCO/VICODIN) 5-325 MG tablet Take 1 tablet by mouth every 4 (four) hours as needed. (Patient not taking: Reported on 09/16/2018)  . LUTEIN PO Take 1 tablet by mouth 2 (two) times daily.   . metoprolol tartrate (LOPRESSOR) 25 MG tablet Take 0.5 tablets (12.5 mg total) by mouth 2 (two) times daily.  . Multiple Vitamins-Minerals (ONE DAILY FOR MEN 50+ ADVANCED PO) Take 1 tablet by mouth daily.   Marland Kitchen omeprazole (PRILOSEC) 40 MG capsule TAKE 1 CAPSULE DAILY  . OVER THE COUNTER MEDICATION Take 1 tablet by mouth 2 (two) times daily. Vinegar tablet by mouth twice a day   . saw palmetto 500 MG capsule Take 500 mg by mouth 2 (two) times daily.  . Turmeric 500 MG CAPS Take 500 mg by mouth 2 (two) times daily.   . vitamin B-12 (CYANOCOBALAMIN) 500 MCG tablet Take 1,000 mcg by mouth daily.  . vitamin C (ASCORBIC ACID) 500 MG tablet Take 500 mg by mouth 2 (two) times daily.  . [DISCONTINUED] Omega-3 Fatty Acids (FISH OIL) 1000 MG CAPS Take 1,000 mg by mouth daily.   . [DISCONTINUED] vitamin A 25000 UNIT capsule Take 25,000 Units by mouth daily.   No facility-administered encounter medications on file as of 10/25/2018.     ALLERGIES:  No Known Allergies   PHYSICAL EXAM:  ECOG Performance status: 1  VITAL SIGNS:BP:  181/92, P:48, R:18, TEMP:98.2, SATS:99% Weight:201.6  Physical Exam Constitutional:      Appearance: He is well-developed.  Cardiovascular:     Rate and Rhythm: Normal rate and regular rhythm.     Heart sounds: Normal heart sounds.  Pulmonary:     Effort: Pulmonary effort is normal.     Breath sounds: Normal breath sounds.  Musculoskeletal: Normal range of motion.  Skin:    General: Skin is warm and dry.  Neurological:     Mental Status: He is alert and oriented to person, place, and time.   Abdomen: No palpable organomegaly.  Colostomy bag in place in the left lower quadrant. No palpable masses in the lower chest wall/upper abdominal region.  It is nontender. Extremities: No edema or cyanosis.   LABORATORY DATA:  I have reviewed the labs as listed.  CBC    Component Value Date/Time   WBC 10.3 10/18/2018 1043   RBC 4.89 10/18/2018 1043   HGB 15.1 10/18/2018 1043   HGB 14.9 03/14/2018 0921   HCT 46.0 10/18/2018 1043   HCT 44.7 03/14/2018 0921   PLT 362 10/18/2018 1043   PLT 322 03/14/2018 0921   MCV 94.1 10/18/2018 1043   MCV 91 03/14/2018 0921   MCH 30.9 10/18/2018 1043   MCHC 32.8 10/18/2018 1043   RDW 13.2 10/18/2018 1043   RDW 13.1 03/14/2018 0921   LYMPHSABS 1.7 10/18/2018 1043   LYMPHSABS 1.4 03/14/2018 0921   MONOABS 0.7 10/18/2018 1043   EOSABS 0.4 10/18/2018 1043   EOSABS 0.3 03/14/2018 0921   BASOSABS 0.1 10/18/2018 1043   BASOSABS 0.0 03/14/2018 0921   CMP Latest Ref Rng & Units 10/18/2018 09/16/2018 03/14/2018  Glucose 70 - 99 mg/dL 96 - 90  BUN 8 - 23 mg/dL 26(H) - 25  Creatinine 0.61 - 1.24 mg/dL 0.89 - 1.04  Sodium 135 - 145 mmol/L 139 - 138  Potassium 3.5 - 5.1 mmol/L 4.8 5.0 4.8  Chloride 98 - 111 mmol/L 103 - 102  CO2 22 - 32 mmol/L 28 -  22  Calcium 8.9 - 10.3 mg/dL 9.1 - 9.2  Total Protein 6.5 - 8.1 g/dL 6.8 - 6.2  Total Bilirubin 0.3 - 1.2 mg/dL 0.6 - 0.3  Alkaline Phos 38 - 126 U/L 95 - 75  AST 15 - 41 U/L 16 - 12  ALT 0 - 44 U/L 27 - 14        DIAGNOSTIC IMAGING:  I have independently reviewed CT scan images and discussed with patient.    ASSESSMENT & PLAN:   Rectal cancer, uT2uN0, s/p LAR/ileostomy 03/20/2014 colostomy Aug2015 1.  Stage II (PT 2 PN 0) rectal adenocarcinoma: - Status post low anterior resection with ileostomy on 03/20/2014, pathology showing grade 2 moderately differentiated adenocarcinoma involving distal one third of the rectum, MMR normal. -PET CT scan prior to surgical resection on 01/09/2014 negative for metastatic disease. -Takedown of loop ileostomy in January 2016. - Colonoscopy on 03/22/2018 shows diverticulosis in the transverse colon and in the ascending colon.  Has close to 35 cm of colon remaining.  Surgical stoma is normal. -He denies any bleeding into colostomy bag. -Family history significant for sister having colon cancer at age 50 and died of it. - CT of the abdomen and pelvis on 04/16/2018 shows interval reduction of peristomal hernia of small bowel.  No evidence of recurrence of his cancer. - Physical examination did not reveal any suspicious masses.  Blood work including CEA were within normal limits. - I will see him back in 6 months for follow-up with repeat CT scan and blood work.  That will be his last CT scan.      Orders placed this encounter:  Orders Placed This Encounter  Procedures  . CT Abdomen Pelvis W Contrast  . CBC with Differential/Platelet  . Comprehensive metabolic panel  . CEA      Derek Jack, MD [place] [phone]

## 2018-10-25 NOTE — Patient Instructions (Signed)
[place] at [facility] Discharge Instructions  You were seen today by Dr. Delton Coombes, he went over how you've been feeling and the tightness in your upper abdomen. He checked your rash as well. He went over you recent lab results and everything looked good. He examined you and found nothing worrisome. We will see you back in 6 months after you CT scan for labs and follow up.   Thank you for choosing [place] at [facility] to provide your oncology and hematology care.  To afford each patient quality time with our provider, please arrive at least 15 minutes before your scheduled appointment time.   If you have a lab appointment with the [facility] please come in thru the  Main Entrance and check in at the main information desk  You need to re-schedule your appointment should you arrive 10 or more minutes late.  We strive to give you quality time with our providers, and arriving late affects you and other patients whose appointments are after yours.  Also, if you no show three or more times for appointments you may be dismissed from the clinic at the providers discretion.     Again, thank you for choosing [facility].  Our hope is that these requests will decrease the amount of time that you wait before being seen by our physicians.       _____________________________________________________________  Should you have questions after your visit to [facility], please contact our office at [phone] between the hours of 8:00 a.m. and 4:30 p.m.  Voicemails left after 4:00 p.m. will not be returned until the following business day.  For prescription refill requests, have your pharmacy contact our office and allow 72 hours.    Cancer Center Support Programs:   > Cancer Support Group  2nd Tuesday of the month 1pm-2pm, Journey Room

## 2018-12-09 ENCOUNTER — Other Ambulatory Visit: Payer: Self-pay | Admitting: Family Medicine

## 2018-12-16 ENCOUNTER — Ambulatory Visit: Payer: Medicare HMO | Admitting: Family Medicine

## 2018-12-26 DIAGNOSIS — Z85038 Personal history of other malignant neoplasm of large intestine: Secondary | ICD-10-CM | POA: Diagnosis not present

## 2018-12-26 DIAGNOSIS — Z933 Colostomy status: Secondary | ICD-10-CM | POA: Diagnosis not present

## 2018-12-31 DIAGNOSIS — Z85038 Personal history of other malignant neoplasm of large intestine: Secondary | ICD-10-CM | POA: Diagnosis not present

## 2018-12-31 DIAGNOSIS — Z933 Colostomy status: Secondary | ICD-10-CM | POA: Diagnosis not present

## 2019-04-24 ENCOUNTER — Other Ambulatory Visit: Payer: Self-pay | Admitting: Family Medicine

## 2019-04-29 ENCOUNTER — Ambulatory Visit (HOSPITAL_COMMUNITY)
Admission: RE | Admit: 2019-04-29 | Discharge: 2019-04-29 | Disposition: A | Discharge status: Home / Self Care | Payer: Medicare HMO | Source: Ambulatory Visit | Attending: Hematology | Admitting: Hematology

## 2019-04-29 ENCOUNTER — Inpatient Hospital Stay (HOSPITAL_COMMUNITY): Payer: Medicare HMO | Attending: Hematology

## 2019-04-29 ENCOUNTER — Other Ambulatory Visit: Payer: Self-pay

## 2019-04-29 DIAGNOSIS — Z23 Encounter for immunization: Secondary | ICD-10-CM | POA: Diagnosis not present

## 2019-04-29 DIAGNOSIS — N200 Calculus of kidney: Secondary | ICD-10-CM | POA: Diagnosis not present

## 2019-04-29 DIAGNOSIS — K219 Gastro-esophageal reflux disease without esophagitis: Secondary | ICD-10-CM | POA: Insufficient documentation

## 2019-04-29 DIAGNOSIS — R200 Anesthesia of skin: Secondary | ICD-10-CM | POA: Insufficient documentation

## 2019-04-29 DIAGNOSIS — Z791 Long term (current) use of non-steroidal anti-inflammatories (NSAID): Secondary | ICD-10-CM | POA: Insufficient documentation

## 2019-04-29 DIAGNOSIS — E669 Obesity, unspecified: Secondary | ICD-10-CM | POA: Insufficient documentation

## 2019-04-29 DIAGNOSIS — Z79899 Other long term (current) drug therapy: Secondary | ICD-10-CM | POA: Insufficient documentation

## 2019-04-29 DIAGNOSIS — K435 Parastomal hernia without obstruction or  gangrene: Secondary | ICD-10-CM | POA: Diagnosis not present

## 2019-04-29 DIAGNOSIS — C20 Malignant neoplasm of rectum: Secondary | ICD-10-CM | POA: Insufficient documentation

## 2019-04-29 DIAGNOSIS — R110 Nausea: Secondary | ICD-10-CM | POA: Insufficient documentation

## 2019-04-29 DIAGNOSIS — I10 Essential (primary) hypertension: Secondary | ICD-10-CM | POA: Diagnosis not present

## 2019-04-29 DIAGNOSIS — M1990 Unspecified osteoarthritis, unspecified site: Secondary | ICD-10-CM | POA: Insufficient documentation

## 2019-04-29 DIAGNOSIS — Z7982 Long term (current) use of aspirin: Secondary | ICD-10-CM | POA: Diagnosis not present

## 2019-04-29 DIAGNOSIS — Z932 Ileostomy status: Secondary | ICD-10-CM | POA: Insufficient documentation

## 2019-04-29 LAB — COMPREHENSIVE METABOLIC PANEL
ALT: 28 U/L (ref 0–44)
AST: 20 U/L (ref 15–41)
Albumin: 3.6 g/dL (ref 3.5–5.0)
Alkaline Phosphatase: 71 U/L (ref 38–126)
Anion gap: 7 (ref 5–15)
BUN: 23 mg/dL (ref 8–23)
CO2: 28 mmol/L (ref 22–32)
Calcium: 8.7 mg/dL — ABNORMAL LOW (ref 8.9–10.3)
Chloride: 101 mmol/L (ref 98–111)
Creatinine, Ser: 0.93 mg/dL (ref 0.61–1.24)
GFR calc Af Amer: >60 mL/min (ref >60)
GFR calc non Af Amer: >60 mL/min (ref >60)
Glucose, Bld: 94 mg/dL (ref 70–99)
Potassium: 4.5 mmol/L (ref 3.5–5.1)
Sodium: 136 mmol/L (ref 135–145)
Total Bilirubin: 0.7 mg/dL (ref 0.3–1.2)
Total Protein: 6.3 g/dL — ABNORMAL LOW (ref 6.5–8.1)

## 2019-04-29 LAB — CBC WITH DIFFERENTIAL/PLATELET
Abs Immature Granulocytes: 0.02 10*3/uL (ref 0.00–0.07)
Basophils Absolute: 0 10*3/uL (ref 0.0–0.1)
Basophils Relative: 1 %
Eosinophils Absolute: 0.1 10*3/uL (ref 0.0–0.5)
Eosinophils Relative: 2 %
HCT: 43.6 % (ref 39.0–52.0)
Hemoglobin: 14.6 g/dL (ref 13.0–17.0)
Immature Granulocytes: 0 %
Lymphocytes Relative: 18 %
Lymphs Abs: 1.1 10*3/uL (ref 0.7–4.0)
MCH: 31.5 pg (ref 26.0–34.0)
MCHC: 33.5 g/dL (ref 30.0–36.0)
MCV: 94.2 fL (ref 80.0–100.0)
Monocytes Absolute: 0.5 10*3/uL (ref 0.1–1.0)
Monocytes Relative: 8 %
Neutro Abs: 4.4 10*3/uL (ref 1.7–7.7)
Neutrophils Relative %: 71 %
Platelets: 283 10*3/uL (ref 150–400)
RBC: 4.63 MIL/uL (ref 4.22–5.81)
RDW: 13.2 % (ref 11.5–15.5)
WBC: 6.2 10*3/uL (ref 4.0–10.5)
nRBC: 0 % (ref 0.0–0.2)

## 2019-04-29 LAB — POCT I-STAT CREATININE: Creatinine, Ser: 1 mg/dL (ref 0.61–1.24)

## 2019-04-29 MED ORDER — IOHEXOL 300 MG/ML  SOLN
100.0000 mL | Freq: Once | INTRAMUSCULAR | Status: AC | PRN
  Administered 2019-04-29: 100 mL via INTRAVENOUS

## 2019-04-30 LAB — CEA: CEA: 3.3 ng/mL (ref 0.0–4.7)

## 2019-05-02 ENCOUNTER — Encounter (HOSPITAL_COMMUNITY): Payer: Self-pay | Admitting: Nurse Practitioner

## 2019-05-02 ENCOUNTER — Other Ambulatory Visit: Payer: Self-pay

## 2019-05-02 ENCOUNTER — Inpatient Hospital Stay (HOSPITAL_COMMUNITY): Payer: Medicare HMO | Admitting: Nurse Practitioner

## 2019-05-02 DIAGNOSIS — Z23 Encounter for immunization: Secondary | ICD-10-CM | POA: Diagnosis not present

## 2019-05-02 DIAGNOSIS — C20 Malignant neoplasm of rectum: Secondary | ICD-10-CM

## 2019-05-02 DIAGNOSIS — M1990 Unspecified osteoarthritis, unspecified site: Secondary | ICD-10-CM | POA: Diagnosis not present

## 2019-05-02 DIAGNOSIS — K219 Gastro-esophageal reflux disease without esophagitis: Secondary | ICD-10-CM | POA: Diagnosis not present

## 2019-05-02 DIAGNOSIS — Z932 Ileostomy status: Secondary | ICD-10-CM | POA: Diagnosis not present

## 2019-05-02 DIAGNOSIS — R200 Anesthesia of skin: Secondary | ICD-10-CM | POA: Diagnosis not present

## 2019-05-02 DIAGNOSIS — E669 Obesity, unspecified: Secondary | ICD-10-CM | POA: Diagnosis not present

## 2019-05-02 DIAGNOSIS — K435 Parastomal hernia without obstruction or  gangrene: Secondary | ICD-10-CM | POA: Diagnosis not present

## 2019-05-02 DIAGNOSIS — R110 Nausea: Secondary | ICD-10-CM | POA: Diagnosis not present

## 2019-05-02 DIAGNOSIS — I10 Essential (primary) hypertension: Secondary | ICD-10-CM | POA: Diagnosis not present

## 2019-05-02 MED ORDER — INFLUENZA VAC SPLIT QUAD 0.5 ML IM SUSY
0.5000 mL | PREFILLED_SYRINGE | Freq: Once | INTRAMUSCULAR | Status: AC
  Administered 2019-05-02: 0.5 mL via INTRAMUSCULAR
  Filled 2019-05-02: qty 0.5

## 2019-05-02 NOTE — Progress Notes (Signed)
[place], [place] [address]   CLINIC:  Medical Oncology/Hematology  PCP:  [name], [place] [address] [phone]   REASON FOR VISIT: Follow-up for rectal adenocarcinoma  CURRENT THERAPY: Surveillance per NCCN guidelines  BRIEF ONCOLOGIC HISTORY:  Oncology History  Rectal cancer, uT2uN0, s/p LAR/ileostomy 03/20/2014 colostomy Aug2015  12/25/2013 Initial Diagnosis   Rectal cancer, posterior 4cm from anal verge      CANCER STAGING: Cancer Staging Rectal cancer, uT2uN0, s/p LAR/ileostomy 03/20/2014 colostomy Aug2015 Staging form: Colon and Rectum, AJCC 7th Edition - Clinical: T2, N0 - Unsigned    INTERVAL HISTORY:  Mr. Wilk 63 y.o. male returns for routine follow-up for rectal adenocarcinoma.  Patient reports he has been feeling well since last visit.  He reports he does have occasional left lower quadrant pain due to his hernia.  Otherwise he is doing well and still working full-time as a Psychologist, sport and exercise.  He denies any bright red bleeding per ostomy bag or melena. Denies any nausea, vomiting, or diarrhea. Denies any new pains. Had not noticed any recent bleeding such as epistaxis, hematuria or hematochezia. Denies recent chest pain on exertion, shortness of breath on minimal exertion, pre-syncopal episodes, or palpitations. Denies any numbness or tingling in hands or feet. Denies any recent fevers, infections, or recent hospitalizations. Patient reports appetite at 100% and energy level at 50%.  He is eating well maintain his weight this time.   REVIEW OF SYSTEMS:  Review of Systems  Gastrointestinal: Positive for nausea.  Neurological: Positive for numbness.  All other systems reviewed and are negative.    PAST MEDICAL/SURGICAL HISTORY:  Past Medical History:  Diagnosis Date  . Arthritis   . Aspiration pneumonia ([place]) 03/24/2014  . Colostomy in place [facility])   . Difficulty urinating   . GERD  (gastroesophageal reflux disease)   . Hypertension   . Ileostomy present ([place])   . Rectal cancer, uT2uN0, s/p LAR/ileostomy 03/20/2014 colostomy Aug2015 12/25/2013   TCS MAY 2015   . Shortening, leg, congenital    Left  . Skin irritation    Surrounding ostomy site   Past Surgical History:  Procedure Laterality Date  . COLON RESECTION N/A 03/24/2014   Procedure: Diagnostic  laparotomy, takedown of coloanal anastamosis, end colostomy, with wound vac;  Surgeon: Adin Hector, MD;  Location: [facility];  Service: General;  Laterality: N/A;  . COLONOSCOPY N/A 12/22/2013   SLF: 1. Normal mucosa in the terminal ilium. 2. Moderate diverticulosis in the transverse colon, descending colon, and sigmoid colon. 3. Rectal bleeding due ot rectal mass and hemorrhoids 4. Moderate sized internal hemorrhoids.   . COLONOSCOPY N/A 01/19/2015   Procedure: COLONOSCOPY;  Surgeon: Danie Binder, MD;  [facility];  Service: Endoscopy;  Laterality: N/A;  1215  . COLONOSCOPY N/A 03/22/2018   Procedure: COLONOSCOPY;  Surgeon: Danie Binder, MD;  [facility];  Service: Endoscopy;  Laterality: N/A;  2:15pm  . COLOSTOMY    . ESOPHAGOGASTRODUODENOSCOPY N/A 12/22/2013   SLF: Probable Candia esophagitis2. Probable Barretts esphagus 3. Mild gastritis.   . EUS N/A 01/01/2014   Procedure: LOWER ENDOSCOPIC ULTRASOUND (EUS);  Surgeon: Milus Banister, MD;  Location: [facility];  Service: Endoscopy;  Laterality: N/A;  . EXAMINATION UNDER ANESTHESIA  08/25/2014   Procedure: EXAM UNDER ANESTHESIA;  Surgeon: Michael Boston, MD;  Location: [facility];  Service: General;;  . eye reattachment and ear reattachment  Right 1971  .  FLEXIBLE SIGMOIDOSCOPY N/A 03/24/2014   Procedure: FLEXIBLE SIGMOIDOSCOPY;  Surgeon: Adin Hector, MD;  [facility];  Service: General;  Laterality: N/A;  . HIP FRACTURE SURGERY Left age 70 on 04-06-1970  . ILEOSTOMY    . ILEOSTOMY CLOSURE N/A 08/25/2014   Procedure: TAKEDOWN OF LOOP ILEOSTOMY ;   Surgeon: Michael Boston, MD;  [facility];  Service: General;  Laterality: N/A;  . INSERTION OF MESH N/A 04/11/2017   Procedure: INSERTION OF MESH ;  Surgeon: Michael Boston, MD;  [facility];  Service: General;  Laterality: N/A;  BILATERAL TAP BLOCK  . TOTAL HIP ARTHROPLASTY Left 11/19/2015   Procedure: LEFT TOTAL HIP ARTHROPLASTY ANTERIOR APPROACH;  Surgeon: Mcarthur Rossetti, MD;  Location: [facility];  Service: Orthopedics;  Laterality: Left;  Marland Kitchen VENTRAL HERNIA REPAIR N/A 04/11/2017   Procedure: LAPAROSCOPIC INCARCERATED PERISTOMAL AND INCARCERATED INCISIONAL HERNIA X2;  Surgeon: Michael Boston, MD;  [facility];  Service: General;  Laterality: N/A;  BILATERAL TAP BLOCK     SOCIAL HISTORY:  Social History   Socioeconomic History  . Marital status: Married    Spouse name: Not on file  . Number of children: Not on file  . Years of education: Not on file  . Highest education level: Not on file  Occupational History  . Not on file  Social Needs  . Financial resource strain: Not on file  . Food insecurity    Worry: Not on file    Inability: Not on file  . Transportation needs    Medical: Not on file    Non-medical: Not on file  Tobacco Use  . Smoking status: Former Smoker    Packs/day: 0.00    Years: 15.00    Pack years: 0.00    Types: Cigars    Quit date: 09/10/2011    Years since quitting: 7.6  . Smokeless tobacco: Never Used  Substance and Sexual Activity  . Alcohol use: No    Comment: quit years ago  . Drug use: No  . Sexual activity: Not on file  Lifestyle  . Physical activity    Days per week: Not on file    Minutes per session: Not on file  . Stress: Not on file  Relationships  . Social Herbalist on phone: Not on file    Gets together: Not on file    Attends religious service: Not on file    Active member of club or organization: Not on file    Attends meetings of clubs or organizations: Not on file    Relationship status: Not on file  .  Intimate partner violence    Fear of current or ex partner: Not on file    Emotionally abused: Not on file    Physically abused: Not on file    Forced sexual activity: Not on file  Other Topics Concern  . 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    FAMILY HISTORY:  Family History  Problem Relation Age of Onset  . Colon cancer Sister   . Emphysema Mother   . Heart attack Brother   . Colon polyps Neg Hx     CURRENT MEDICATIONS:  Outpatient Encounter Medications as of 05/02/2019  Medication Sig  . Beta Carotene (VITAMIN A) 25000 UNIT capsule Take 25,000 Units by mouth daily.  Marland Kitchen amLODipine (NORVASC) 5 MG tablet Take 1 tablet (5 mg total) by mouth daily.  Marland Kitchen aspirin  EC 81 MG tablet Take 81 mg by mouth daily.  Marland Kitchen co-enzyme Q-10 30 MG capsule Take 30 mg by mouth 2 (two) times daily.  . LUTEIN PO Take 1 tablet by mouth 2 (two) times daily.   . metoprolol tartrate (LOPRESSOR) 25 MG tablet TAKE 1 TABLET BY MOUTH TWICE A DAY  . Multiple Vitamins-Minerals (ONE DAILY FOR MEN 50+ ADVANCED PO) Take 1 tablet by mouth daily.   Marland Kitchen NAPROXEN DR 500 MG EC tablet Take 500 mg by mouth 2 (two) times daily.  Marland Kitchen omeprazole (PRILOSEC) 40 MG capsule TAKE 1 CAPSULE BY MOUTH EVERY DAY  . OVER THE COUNTER MEDICATION Take 1 tablet by mouth 2 (two) times daily. Vinegar tablet by mouth twice a day   . saw palmetto 500 MG capsule Take 500 mg by mouth 2 (two) times daily.  . Turmeric 500 MG CAPS Take 500 mg by mouth 2 (two) times daily.   . vitamin B-12 (CYANOCOBALAMIN) 500 MCG tablet Take 1,000 mcg by mouth daily.  . vitamin C (ASCORBIC ACID) 500 MG tablet Take 500 mg by mouth 2 (two) times daily.  . [DISCONTINUED] HYDROcodone-acetaminophen (NORCO/VICODIN) 5-325 MG tablet Take 1 tablet by mouth every 4 (four) hours as needed. (Patient not taking: Reported on 09/16/2018)  . [DISCONTINUED] vitamin E 200 UNIT capsule Take 200 Units by mouth 2 (two) times daily.  . [EXPIRED] influenza  vac split quadrivalent PF (FLUARIX) injection 0.5 mL    No facility-administered encounter medications on file as of 05/02/2019.     ALLERGIES:  No Known Allergies   PHYSICAL EXAM:  ECOG Performance status: 1  Vitals:   05/02/19 1105  BP: 124/84  Pulse: 75  Resp: 18  Temp: (!) 97.3 F (36.3 C)  SpO2: 98%   Filed Weights   05/02/19 1105  Weight: 193 lb 8 oz (87.8 kg)    Physical Exam Constitutional:      Appearance: He is obese.  Cardiovascular:     Rate and Rhythm: Normal rate and regular rhythm.     Heart sounds: Normal heart sounds.  Pulmonary:     Effort: Pulmonary effort is normal.     Breath sounds: Normal breath sounds.  Abdominal:     General: Bowel sounds are normal.     Palpations: Abdomen is soft.     Hernia: A hernia is present.  Musculoskeletal: Normal range of motion.  Skin:    General: Skin is warm and dry.  Neurological:     Mental Status: He is alert and oriented to person, place, and time. Mental status is at baseline.  Psychiatric:        Mood and Affect: Mood normal.        Behavior: Behavior normal.        Thought Content: Thought content normal.        Judgment: Judgment normal.      LABORATORY DATA:  I have reviewed the labs as listed.  CBC    Component Value Date/Time   WBC 6.2 04/29/2019 0936   RBC 4.63 04/29/2019 0936   HGB 14.6 04/29/2019 0936   HGB 14.9 03/14/2018 0921   HCT 43.6 04/29/2019 0936   HCT 44.7 03/14/2018 0921   PLT 283 04/29/2019 0936   PLT 322 03/14/2018 0921   MCV 94.2 04/29/2019 0936   MCV 91 03/14/2018 0921   MCH 31.5 04/29/2019 0936   MCHC 33.5 04/29/2019 0936   RDW 13.2 04/29/2019 0936   RDW 13.1 03/14/2018 0921   LYMPHSABS  1.1 04/29/2019 0936   LYMPHSABS 1.4 03/14/2018 0921   MONOABS 0.5 04/29/2019 0936   EOSABS 0.1 04/29/2019 0936   EOSABS 0.3 03/14/2018 0921   BASOSABS 0.0 04/29/2019 0936   BASOSABS 0.0 03/14/2018 0921   CMP Latest Ref Rng & Units 04/29/2019 04/29/2019 10/18/2018  Glucose 70 -  99 mg/dL 94 - 96  BUN 8 - 23 mg/dL 23 - 26(H)  Creatinine 0.61 - 1.24 mg/dL 0.93 1.00 0.89  Sodium 135 - 145 mmol/L 136 - 139  Potassium 3.5 - 5.1 mmol/L 4.5 - 4.8  Chloride 98 - 111 mmol/L 101 - 103  CO2 22 - 32 mmol/L 28 - 28  Calcium 8.9 - 10.3 mg/dL 8.7(L) - 9.1  Total Protein 6.5 - 8.1 g/dL 6.3(L) - 6.8  Total Bilirubin 0.3 - 1.2 mg/dL 0.7 - 0.6  Alkaline Phos 38 - 126 U/L 71 - 95  AST 15 - 41 U/L 20 - 16  ALT 0 - 44 U/L 28 - 27     DIAGNOSTIC IMAGING:  I have independently reviewed the CT scans and discussed with the patient.   I personally performed a face-to-face visit.  All questions were answered to patient's stated satisfaction. Encouraged patient to call with any new concerns or questions before his next visit to the cancer center and we can certain see him sooner, if needed.     ASSESSMENT & PLAN:   Rectal cancer, uT2uN0, s/p LAR/ileostomy 03/20/2014 colostomy Aug2015 1.  Stage II rectal adenocarcinoma: -Status post low anterior resection with ileostomy on 03/20/2014, pathology showing grade 2 moderately differentiated adenocarcinoma involving distal one third of the rectum, MMR normal. - PET CT scan prior to surgical resection on 01/09/2014- for metastatic disease. -Takedown of the loop ileostomy in 08/2014. -Colonoscopy on 03/22/2018 showed diverticulosis in the transverse colon and the ascending colon.  Has close to 35 cm of colon remaining.  Surgical stoma is normal.  Repeat colonoscopy in 5 years which would be 03/2023. -CT of the abdomen pelvis on 04/16/2018 showed interval reduction of the parastomal hernia of the small bowel.  No evidence of recurrence of his cancer. -CT of the abdomen and pelvis on 03/30/2019 showed postoperative findings of rectal resection with left lower quadrant end colonoscopy and parastomal hernia, unchanged compared to prior exam.  No evidence of malignant recurrence in the abdomen or pelvis. -He denies any bright red bleeding into his colostomy  bag.  He does report having left lower quadrant pain due to hernia which was seen on his last CT scan.  This pain is only intermittent. - Physical examination did not reveal any suspicious masses. -Labs done on 03/30/2019 shows potassium 4.5, creatinine 0.93, WBC 6.2, hemoglobin 14.6, platelets 283.  CEA level was 3.3. - Patient has now reached his 5-year mark no need for recurrent CT scans we will follow him yearly with labs. -Patient will follow-up in 1 year with repeat blood work.      Orders placed this encounter:  Orders Placed This Encounter  Procedures  . Lactate dehydrogenase  . CBC with Differential/Platelet  . Comprehensive metabolic panel  . CEA  . Vitamin B12  . VITAMIN D 25 Hydroxy (Vit-D Deficiency, Fractures)      Francene Finders, FNP-C [place] [phone]

## 2019-05-02 NOTE — Patient Instructions (Addendum)
[place] at [facility]  Discharge Instructions: Follow up in 1 year with labs   You saw Francene Finders, NP, today. _______________________________________________________________  Thank you for choosing [place] at [facility] to provide your oncology and hematology care.  To afford each patient quality time with our providers, please arrive at least 15 minutes before your scheduled appointment.  You need to re-schedule your appointment if you arrive 10 or more minutes late.  We strive to give you quality time with our providers, and arriving late affects you and other patients whose appointments are after yours.  Also, if you no show three or more times for appointments you may be dismissed from the clinic.  Again, thank you for choosing [place] at [place] hope is that these requests will allow you access to exceptional care and in a timely manner. _______________________________________________________________  If you have questions after your visit, please contact our office at [phone] between the hours of 8:30 a.m. and 5:00 p.m. Voicemails left after 4:30 p.m. will not be returned until the following business day. _______________________________________________________________  For prescription refill requests, have your pharmacy contact our office. _______________________________________________________________  Recommendations made by the consultant and any test results will be sent to your referring physician. _______________________________________________________________

## 2019-05-02 NOTE — Assessment & Plan Note (Signed)
1.  Stage II rectal adenocarcinoma: -Status post low anterior resection with ileostomy on 03/20/2014, pathology showing grade 2 moderately differentiated adenocarcinoma involving distal one third of the rectum, MMR normal. - PET CT scan prior to surgical resection on 01/09/2014- for metastatic disease. -Takedown of the loop ileostomy in 08/2014. -Colonoscopy on 03/22/2018 showed diverticulosis in the transverse colon and the ascending colon.  Has close to 35 cm of colon remaining.  Surgical stoma is normal.  Repeat colonoscopy in 5 years which would be 03/2023. -CT of the abdomen pelvis on 04/16/2018 showed interval reduction of the parastomal hernia of the small bowel.  No evidence of recurrence of his cancer. -CT of the abdomen and pelvis on 03/30/2019 showed postoperative findings of rectal resection with left lower quadrant end colonoscopy and parastomal hernia, unchanged compared to prior exam.  No evidence of malignant recurrence in the abdomen or pelvis. -He denies any bright red bleeding into his colostomy bag.  He does report having left lower quadrant pain due to hernia which was seen on his last CT scan.  This pain is only intermittent. - Physical examination did not reveal any suspicious masses. -Labs done on 03/30/2019 shows potassium 4.5, creatinine 0.93, WBC 6.2, hemoglobin 14.6, platelets 283.  CEA level was 3.3. - Patient has now reached his 5-year mark no need for recurrent CT scans we will follow him yearly with labs. -Patient will follow-up in 1 year with repeat blood work.

## 2019-06-07 ENCOUNTER — Other Ambulatory Visit: Payer: Self-pay | Admitting: Family Medicine

## 2019-06-10 NOTE — Telephone Encounter (Signed)
sched virt, then may ref times one

## 2019-08-09 ENCOUNTER — Other Ambulatory Visit: Payer: Self-pay | Admitting: Family Medicine

## 2019-08-19 ENCOUNTER — Other Ambulatory Visit: Payer: Self-pay | Admitting: Family Medicine

## 2019-08-19 NOTE — Telephone Encounter (Signed)
Scheduled 1/5

## 2019-08-19 NOTE — Telephone Encounter (Signed)
Please contact patient to set up appt; then may route back to nurses. Thank you  

## 2019-08-26 ENCOUNTER — Ambulatory Visit: Payer: Medicare HMO | Admitting: Family Medicine

## 2019-08-26 ENCOUNTER — Other Ambulatory Visit: Payer: Self-pay

## 2019-08-27 ENCOUNTER — Telehealth: Payer: Self-pay | Admitting: Family Medicine

## 2019-08-27 NOTE — Telephone Encounter (Signed)
Fax from [employer] requesting last 6 months progress notes that support individuals need for ostomy care supplies. Pt last seen 09/16/2018 for wellness. Pt contacted and states that [employer] is what company he has been using for years. Fax in provider box.  Please advise. Thank you

## 2019-08-27 NOTE — Telephone Encounter (Signed)
Pt is aware.  

## 2019-08-27 NOTE — Telephone Encounter (Signed)
Will do after viait next week

## 2019-08-30 DIAGNOSIS — Z85038 Personal history of other malignant neoplasm of large intestine: Secondary | ICD-10-CM | POA: Diagnosis not present

## 2019-08-30 DIAGNOSIS — Z933 Colostomy status: Secondary | ICD-10-CM | POA: Diagnosis not present

## 2019-09-02 ENCOUNTER — Ambulatory Visit (INDEPENDENT_AMBULATORY_CARE_PROVIDER_SITE_OTHER): Payer: Medicare HMO | Admitting: Family Medicine

## 2019-09-02 ENCOUNTER — Encounter: Payer: Self-pay | Admitting: Family Medicine

## 2019-09-02 ENCOUNTER — Other Ambulatory Visit: Payer: Self-pay

## 2019-09-02 DIAGNOSIS — C20 Malignant neoplasm of rectum: Secondary | ICD-10-CM | POA: Diagnosis not present

## 2019-09-02 DIAGNOSIS — I10 Essential (primary) hypertension: Secondary | ICD-10-CM | POA: Diagnosis not present

## 2019-09-02 MED ORDER — METOPROLOL TARTRATE 25 MG PO TABS: 25.0000 mg | ORAL_TABLET | Freq: Two times a day (BID) | ORAL | 1 refills | Status: DC

## 2019-09-02 MED ORDER — AMLODIPINE BESYLATE 5 MG PO TABS: 5.0000 mg | ORAL_TABLET | Freq: Every day | ORAL | 1 refills | Status: DC

## 2019-09-02 NOTE — Progress Notes (Signed)
   Subjective:  Audio  Patient ID: [name], male    DOB: [birth year], 64 y.o.   MRN: [id number]  Hypertension This is a chronic problem. The current episode started more than 1 year ago. Risk factors for coronary artery disease include male gender. Treatments tried: norvasc, metoprolol.      Review of Systems  Virtual Visit via Video Note  I connected with [name] on 09/02/19 at  8:30 AM EST by a video enabled telemedicine application and verified that I am speaking with the correct person using two identifiers.  Location: Patient: home Provider: office   I discussed the limitations of evaluation and management by telemedicine and the availability of in person appointments. The patient expressed understanding and agreed to proceed.  History of Present Illness:    Observations/Objective:   Assessment and Plan:   Follow Up Instructions:    I discussed the assessment and treatment plan with the patient. The patient was provided an opportunity to ask questions and all were answered. The patient agreed with the plan and demonstrated an understanding of the instructions.   The patient was advised to call back or seek an in-person evaluation if the symptoms worsen or if the condition fails to improve as anticipated.  I provided 20 minutes of non-face-to-face time during this encounter.   Blood pressure medicine and blood pressure levels reviewed today with patient. Compliant with blood pressure medicine. States does not miss a dose. No obvious side effects. Blood pressure generally good when checked elsewhere. Watching salt intake.  Blood pressure good when checked elsewhere  Claims compliance with medications.  [place]      Objective:   Physical Exam  Virtual     Assessment & Plan:  Impression 1 hypertension good control discussed maintain same meds  2.  Status post colon cancer with ileostomy.  Has a chronic colostomy bag,  original cancer with its near rectal location did not allow for reanastomosis.  Patient needs ongoing supplies for ostomy  Follow-up in 6 months diet exercise discussed/medications refilled

## 2019-09-12 DIAGNOSIS — Z6831 Body mass index (BMI) 31.0-31.9, adult: Secondary | ICD-10-CM | POA: Diagnosis not present

## 2019-09-12 DIAGNOSIS — K219 Gastro-esophageal reflux disease without esophagitis: Secondary | ICD-10-CM | POA: Diagnosis not present

## 2019-09-12 DIAGNOSIS — G8929 Other chronic pain: Secondary | ICD-10-CM | POA: Diagnosis not present

## 2019-09-12 DIAGNOSIS — I10 Essential (primary) hypertension: Secondary | ICD-10-CM | POA: Diagnosis not present

## 2019-09-12 DIAGNOSIS — Z7982 Long term (current) use of aspirin: Secondary | ICD-10-CM | POA: Diagnosis not present

## 2019-09-12 DIAGNOSIS — R32 Unspecified urinary incontinence: Secondary | ICD-10-CM | POA: Diagnosis not present

## 2019-09-12 DIAGNOSIS — E669 Obesity, unspecified: Secondary | ICD-10-CM | POA: Diagnosis not present

## 2019-09-12 DIAGNOSIS — M1990 Unspecified osteoarthritis, unspecified site: Secondary | ICD-10-CM | POA: Diagnosis not present

## 2019-09-12 DIAGNOSIS — Z933 Colostomy status: Secondary | ICD-10-CM | POA: Diagnosis not present

## 2019-09-12 DIAGNOSIS — Z791 Long term (current) use of non-steroidal anti-inflammatories (NSAID): Secondary | ICD-10-CM | POA: Diagnosis not present

## 2019-09-16 ENCOUNTER — Telehealth: Payer: Self-pay | Admitting: Family Medicine

## 2019-09-16 NOTE — Telephone Encounter (Signed)
[name] from [place] contacted office to see if Dr.Steve would like to speak to Medical Director regarding overage denial of barrier swabs. Pt has been approved for ostomy supplies but [facility] [place] had requested 450 barrier wipes but with Medicare, they only approve 150 wipes. If provider would like to do a peer-to-peer, the number to speak with medical director is [phone]; this would need to be scheduled and occur by 1pm on 09/18/19.  Reference [id number]. All other ostomy supplies are approved.

## 2019-09-18 NOTE — Telephone Encounter (Signed)
So no need for peer to peer change order to 150 per mo

## 2019-09-18 NOTE — Telephone Encounter (Signed)
We need to reach out to the pt to see why he needs [address] once? How many does he usually utilize per month? And why so many?does his supplier send him supplies every month or every three months? How many has he received per shipment in the past? Allnecessary to know to see if he qualifies for more. I will not be able to any kind of peer to peer until these questions are answered. It may be better for him to get a smaller shipment more frequently

## 2019-09-18 NOTE — Telephone Encounter (Signed)
Spoke with patient. Pt states he only uses about a box per month. 50 swabs come in a box. Changes wafer about twice a week. Pt states that he gets 2 boxes a month but those boxes last about 2 months. Pt states he is not sure where she came up with 450. Pt get supplies every month unless he does not need anything.

## 2019-09-21 DIAGNOSIS — Z20828 Contact with and (suspected) exposure to other viral communicable diseases: Secondary | ICD-10-CM | POA: Diagnosis not present

## 2019-09-25 ENCOUNTER — Encounter: Payer: Self-pay | Admitting: Family Medicine

## 2019-09-25 DIAGNOSIS — Z933 Colostomy status: Secondary | ICD-10-CM | POA: Diagnosis not present

## 2019-09-25 DIAGNOSIS — Z85038 Personal history of other malignant neoplasm of large intestine: Secondary | ICD-10-CM | POA: Diagnosis not present

## 2019-11-12 ENCOUNTER — Other Ambulatory Visit: Payer: Self-pay | Admitting: Family Medicine

## 2020-03-09 ENCOUNTER — Telehealth: Payer: Self-pay | Admitting: *Deleted

## 2020-03-09 DIAGNOSIS — I10 Essential (primary) hypertension: Secondary | ICD-10-CM

## 2020-03-09 DIAGNOSIS — E7800 Pure hypercholesterolemia, unspecified: Secondary | ICD-10-CM

## 2020-03-09 DIAGNOSIS — Z79899 Other long term (current) drug therapy: Secondary | ICD-10-CM

## 2020-03-09 DIAGNOSIS — C20 Malignant neoplasm of rectum: Secondary | ICD-10-CM

## 2020-03-09 MED ORDER — METOPROLOL TARTRATE 25 MG PO TABS: 25.0000 mg | ORAL_TABLET | Freq: Two times a day (BID) | ORAL | 0 refills | Status: DC

## 2020-03-09 MED ORDER — AMLODIPINE BESYLATE 5 MG PO TABS: 5.0000 mg | ORAL_TABLET | Freq: Every day | ORAL | 0 refills | Status: DC

## 2020-03-09 NOTE — Telephone Encounter (Signed)
Pls order those labs and add lipids. Thx. Dr. Lovena Le

## 2020-03-09 NOTE — Telephone Encounter (Signed)
Bw orders put in and pt notified. Pt also notified refill sent to pharm.

## 2020-03-09 NOTE — Addendum Note (Signed)
Addended by: Carmelina Noun on: 03/09/2020 02:19 PM   Modules accepted: Orders

## 2020-03-09 NOTE — Addendum Note (Signed)
Addended by: Erven Colla on: 03/09/2020 01:55 PM   Modules accepted: Orders

## 2020-03-09 NOTE — Telephone Encounter (Signed)
Refill request form [facility] for metoprolol. Please contact pt to schedule appointment with Dr. Lovena Le and send back to nurses.

## 2020-03-09 NOTE — Telephone Encounter (Signed)
Patient has appointment schedule for 04/14/20 that was Dr. Lovena Le first available opening. He needs refill on medication before than.

## 2020-03-09 NOTE — Telephone Encounter (Signed)
Last labs 04/29/19 cbc. Cmp, cea and see message below needs refill

## 2020-04-08 DIAGNOSIS — C20 Malignant neoplasm of rectum: Secondary | ICD-10-CM | POA: Diagnosis not present

## 2020-04-08 DIAGNOSIS — Z79899 Other long term (current) drug therapy: Secondary | ICD-10-CM | POA: Diagnosis not present

## 2020-04-08 DIAGNOSIS — I10 Essential (primary) hypertension: Secondary | ICD-10-CM | POA: Diagnosis not present

## 2020-04-08 DIAGNOSIS — E7800 Pure hypercholesterolemia, unspecified: Secondary | ICD-10-CM | POA: Diagnosis not present

## 2020-04-09 LAB — CBC WITH DIFFERENTIAL/PLATELET
Basophils Absolute: 0.1 10*3/uL (ref 0.0–0.2)
Basos: 1 %
EOS (ABSOLUTE): 0.2 10*3/uL (ref 0.0–0.4)
Eos: 2 %
Hematocrit: 47.9 % (ref 37.5–51.0)
Hemoglobin: 15.9 g/dL (ref 13.0–17.7)
Immature Grans (Abs): 0 10*3/uL (ref 0.0–0.1)
Immature Granulocytes: 0 %
Lymphocytes Absolute: 1.5 10*3/uL (ref 0.7–3.1)
Lymphs: 19 %
MCH: 30.6 pg (ref 26.6–33.0)
MCHC: 33.2 g/dL (ref 31.5–35.7)
MCV: 92 fL (ref 79–97)
Monocytes Absolute: 0.7 10*3/uL (ref 0.1–0.9)
Monocytes: 9 %
Neutrophils Absolute: 5.2 10*3/uL (ref 1.4–7.0)
Neutrophils: 69 %
Platelets: 343 10*3/uL (ref 150–450)
RBC: 5.19 x10E6/uL (ref 4.14–5.80)
RDW: 12.6 % (ref 11.6–15.4)
WBC: 7.6 10*3/uL (ref 3.4–10.8)

## 2020-04-09 LAB — COMPREHENSIVE METABOLIC PANEL
ALT: 35 IU/L (ref 0–44)
AST: 22 IU/L (ref 0–40)
Albumin/Globulin Ratio: 1.6 (ref 1.2–2.2)
Albumin: 4 g/dL (ref 3.8–4.8)
Alkaline Phosphatase: 124 IU/L — ABNORMAL HIGH (ref 48–121)
BUN/Creatinine Ratio: 23 (ref 10–24)
BUN: 22 mg/dL (ref 8–27)
Bilirubin Total: 0.4 mg/dL (ref 0.0–1.2)
CO2: 26 mmol/L (ref 20–29)
Calcium: 9.5 mg/dL (ref 8.6–10.2)
Chloride: 101 mmol/L (ref 96–106)
Creatinine, Ser: 0.95 mg/dL (ref 0.76–1.27)
GFR calc Af Amer: 98 mL/min/{1.73_m2} (ref >59)
GFR calc non Af Amer: 85 mL/min/{1.73_m2} (ref >59)
Globulin, Total: 2.5 g/dL (ref 1.5–4.5)
Glucose: 101 mg/dL — ABNORMAL HIGH (ref 65–99)
Potassium: 5 mmol/L (ref 3.5–5.2)
Sodium: 138 mmol/L (ref 134–144)
Total Protein: 6.5 g/dL (ref 6.0–8.5)

## 2020-04-09 LAB — LIPID PANEL
Chol/HDL Ratio: 5.3 ratio — ABNORMAL HIGH (ref 0.0–5.0)
Cholesterol, Total: 202 mg/dL — ABNORMAL HIGH (ref 100–199)
HDL: 38 mg/dL — ABNORMAL LOW (ref >39)
LDL Chol Calc (NIH): 143 mg/dL — ABNORMAL HIGH (ref 0–99)
Triglycerides: 116 mg/dL (ref 0–149)
VLDL Cholesterol Cal: 21 mg/dL (ref 5–40)

## 2020-04-09 LAB — CEA: CEA: 4.1 ng/mL (ref 0.0–4.7)

## 2020-04-14 ENCOUNTER — Encounter: Payer: Self-pay | Admitting: Family Medicine

## 2020-04-14 ENCOUNTER — Ambulatory Visit (INDEPENDENT_AMBULATORY_CARE_PROVIDER_SITE_OTHER): Payer: Medicare HMO | Admitting: Family Medicine

## 2020-04-14 ENCOUNTER — Other Ambulatory Visit: Payer: Self-pay

## 2020-04-14 VITALS — BP 136/86 | HR 73 | Temp 97.1°F | Ht 65.0 in | Wt 195.6 lb

## 2020-04-14 DIAGNOSIS — R21 Rash and other nonspecific skin eruption: Secondary | ICD-10-CM

## 2020-04-14 DIAGNOSIS — K435 Parastomal hernia without obstruction or  gangrene: Secondary | ICD-10-CM

## 2020-04-14 DIAGNOSIS — B070 Plantar wart: Secondary | ICD-10-CM

## 2020-04-14 DIAGNOSIS — Z933 Colostomy status: Secondary | ICD-10-CM | POA: Diagnosis not present

## 2020-04-14 MED ORDER — NAPROXEN 500 MG PO TABS
500.0000 mg | ORAL_TABLET | Freq: Two times a day (BID) | ORAL | 0 refills | Status: DC
Start: 2020-04-14 — End: 2020-05-03

## 2020-04-14 MED ORDER — KETOCONAZOLE 2 % EX CREA: 1.0000 "application " | TOPICAL_CREAM | Freq: Every day | CUTANEOUS | 0 refills | Status: DC

## 2020-04-14 NOTE — Progress Notes (Addendum)
Patient ID: [name], male    DOB: 1956/03/18, 64 y.o.   MRN: [id number]   Chief Complaint  Patient presents with  . Hypertension    follow up   Subjective:    HPI Rash on back of neck        Left shoulder pain        Hernia is hard and painful        Plantar Warts  Pt having f/u HTN- Taking norvasc 5mg .  Metoprolol 25mg  bid. Pt compliant with meds.  No chest pain, sob, or leg swelling.  H/o colon cancer in past- had resection and has colostomy in place.  Pt has hernia on left abdomen from the stoma. Had hernia surgery, by Dr. Michael [name].  Needing a belt to help and cut a hole in the belt for the colostomy.  Pt changing the clostomy bag and wafers 3x per day.  H/o shingles in past on back of neck. Small area of rash and itchy on back of neck, wearing hat a lot and working outside sweating a lot and itching on neck/hairline.  Also reporting left shoulder pain, occ both, but more on left. Having burning pain at times in deltoid area. No new trauma.  Has been going on for years.  Given naproxen in past, not taking it.  Has plantar warts on rt foot, been there for years.  Has not tried anything for it.  Medical History [name] has a past medical history of [name], Aspiration pneumonia ([place]) (03/24/2014), Colostomy in place [facility]), Difficulty urinating, GERD (gastroesophageal reflux disease), Hypertension, Ileostomy present ([place]), Rectal cancer, uT2uN0, s/p LAR/ileostomy 03/20/2014 colostomy [id number] (12/25/2013), Shortening, leg, congenital, and Skin irritation.   Outpatient Encounter Medications as of 04/14/2020  Medication Sig  . amLODipine (NORVASC) 5 MG tablet Take 1 tablet (5 mg total) by mouth daily.  Marland Kitchen aspirin EC 81 MG tablet Take 81 mg by mouth daily.  . Beta Carotene (VITAMIN A) 25000 UNIT capsule Take 25,000 Units by mouth daily.  Marland Kitchen co-enzyme Q-10 30 MG capsule Take 30 mg by mouth 2 (two) times daily.  Marland Kitchen ketoconazole (NIZORAL) 2 % cream Apply 1 application  topically daily. Apply to rash on neck.  . LUTEIN PO Take 1 tablet by mouth 2 (two) times daily.   . metoprolol tartrate (LOPRESSOR) 25 MG tablet Take 1 tablet (25 mg total) by mouth 2 (two) times daily.  . Multiple Vitamins-Minerals (ONE DAILY FOR MEN 50+ ADVANCED PO) Take 1 tablet by mouth daily.   . naproxen (NAPROSYN) 500 MG tablet Take 1 tablet (500 mg total) by mouth 2 (two) times daily with a meal.  . omeprazole (PRILOSEC) 40 MG capsule TAKE 1 CAPSULE BY MOUTH EVERY DAY  . OVER THE COUNTER MEDICATION Take 1 tablet by mouth 2 (two) times daily. Vinegar tablet by mouth twice a day   . saw palmetto 500 MG capsule Take 500 mg by mouth 2 (two) times daily.  . Turmeric 500 MG CAPS Take 500 mg by mouth 2 (two) times daily.   . vitamin B-12 (CYANOCOBALAMIN) 500 MCG tablet Take 1,000 mcg by mouth daily.  . vitamin C (ASCORBIC ACID) 500 MG tablet Take 500 mg by mouth 2 (two) times daily.  . [DISCONTINUED] NAPROXEN DR 500 MG EC tablet Take 500 mg by mouth 2 (two) times daily.   No facility-administered encounter medications on file as of 04/14/2020.     Review of Systems  Constitutional: Negative for chills and fever.  HENT:  Negative for congestion, rhinorrhea and sore throat.   Respiratory: Negative for cough, shortness of breath and wheezing.   Cardiovascular: Negative for chest pain and leg swelling.  Gastrointestinal: Negative for abdominal pain, diarrhea, nausea and vomiting.  Genitourinary: Negative for dysuria and frequency.  Musculoskeletal:       +bilateral shoulder pain.  Skin: Positive for rash (back of neck).  Neurological: Negative for dizziness, weakness and headaches.     Vitals BP 136/86   Pulse 73   Temp (!) 97.1 F (36.2 C) (Oral)   Ht 5\' 5"  (1.651 m)   Wt 195 lb 9.6 oz (88.7 kg)   SpO2 99%   BMI 32.55 kg/m   Objective:   Physical Exam Vitals and nursing note reviewed.  Constitutional:      General: He is not in acute distress.    Appearance: Normal  appearance. He is not ill-appearing.  HENT:     Head: Normocephalic.     Nose: Nose normal.     Mouth/Throat:     Mouth: Mucous membranes are moist.  Eyes:     Extraocular Movements: Extraocular movements intact.     Conjunctiva/sclera: Conjunctivae normal.     Pupils: Pupils are equal, round, and reactive to light.  Cardiovascular:     Rate and Rhythm: Normal rate and regular rhythm.     Pulses: Normal pulses.     Heart sounds: Normal heart sounds. No murmur heard.   Pulmonary:     Effort: Pulmonary effort is normal.     Breath sounds: Normal breath sounds. No wheezing, rhonchi or rales.  Musculoskeletal:        General: Tenderness (rt deltoid, ttp. normal rom bilateral shoulders) present. Normal range of motion.     Right lower leg: No edema.     Left lower leg: No edema.  Skin:    General: Skin is warm and dry.     Findings: Rash (small erythematous cluster, no blisters) present.  Neurological:     General: No focal deficit present.     Mental Status: He is alert and oriented to person, place, and time.     Cranial Nerves: No cranial nerve deficit.  Psychiatric:        Mood and Affect: Mood normal.        Behavior: Behavior normal.        Thought Content: Thought content normal.        Judgment: Judgment normal.      Assessment and Plan   1. Parastomal hernia without obstruction or gangrene - Ambulatory referral to General Surgery  2. Colostomy status ([place]) - Ambulatory referral to General Surgery  3. Rash of neck - ketoconazole (NIZORAL) 2 % cream; Apply 1 application topically daily. Apply to rash on neck.  Dispense: 30 g; Refill: 0  4. Plantar wart of right foot - Ambulatory referral to Dermatology    Check on the ostomy supplies, pt about to run out of supplies. Pt is needing to change the colostomy bag and wafer 3x per day.  Dx of colon cancer with colostomy with parastomal hernia.  Pt to f/u with General surgery about the hernia.    Bilateral shoulder  pain- restart naproxen. Ice/rest shoulder.  Call or rto if not improving.  Rash on neck- likely fungal will give trial of ketoconazole cream.  Abd hernia- chronic, f/u general surgeon for further evaluation.  Plantar wart- referral to derm.    F/u 5mo or prn.

## 2020-04-16 ENCOUNTER — Telehealth: Payer: Self-pay | Admitting: Family Medicine

## 2020-04-16 ENCOUNTER — Encounter: Payer: Self-pay | Admitting: Family Medicine

## 2020-04-16 NOTE — Telephone Encounter (Signed)
[facility] Supply need verification of Dr Lovena Le being his Medical Doctor. Ostomy Supplies   Pt call back [phone]

## 2020-04-16 NOTE — Telephone Encounter (Signed)
[name] was given Dr. Forde Dandy information and will be getting paperwork together and faxing urgently. Should be by Monday or Tuesday. Patient running low on some of his supplies.

## 2020-04-21 NOTE — Telephone Encounter (Signed)
Form signed and faxed

## 2020-04-22 DIAGNOSIS — D239 Other benign neoplasm of skin, unspecified: Secondary | ICD-10-CM | POA: Diagnosis not present

## 2020-04-27 ENCOUNTER — Inpatient Hospital Stay (HOSPITAL_COMMUNITY): Payer: Medicare HMO | Attending: Hematology

## 2020-04-27 ENCOUNTER — Other Ambulatory Visit: Payer: Self-pay

## 2020-04-27 DIAGNOSIS — Z932 Ileostomy status: Secondary | ICD-10-CM | POA: Diagnosis not present

## 2020-04-27 DIAGNOSIS — R109 Unspecified abdominal pain: Secondary | ICD-10-CM | POA: Insufficient documentation

## 2020-04-27 DIAGNOSIS — Z87891 Personal history of nicotine dependence: Secondary | ICD-10-CM | POA: Insufficient documentation

## 2020-04-27 DIAGNOSIS — Z836 Family history of other diseases of the respiratory system: Secondary | ICD-10-CM | POA: Insufficient documentation

## 2020-04-27 DIAGNOSIS — Z79899 Other long term (current) drug therapy: Secondary | ICD-10-CM | POA: Diagnosis not present

## 2020-04-27 DIAGNOSIS — Z800 Family history of malignant neoplasm of digestive organs: Secondary | ICD-10-CM | POA: Diagnosis not present

## 2020-04-27 DIAGNOSIS — C20 Malignant neoplasm of rectum: Secondary | ICD-10-CM | POA: Diagnosis present

## 2020-04-27 DIAGNOSIS — Z8719 Personal history of other diseases of the digestive system: Secondary | ICD-10-CM | POA: Diagnosis not present

## 2020-04-27 DIAGNOSIS — R0602 Shortness of breath: Secondary | ICD-10-CM | POA: Insufficient documentation

## 2020-04-27 DIAGNOSIS — Z8249 Family history of ischemic heart disease and other diseases of the circulatory system: Secondary | ICD-10-CM | POA: Insufficient documentation

## 2020-04-27 DIAGNOSIS — I10 Essential (primary) hypertension: Secondary | ICD-10-CM | POA: Diagnosis not present

## 2020-04-27 DIAGNOSIS — Z933 Colostomy status: Secondary | ICD-10-CM | POA: Diagnosis not present

## 2020-04-27 DIAGNOSIS — R350 Frequency of micturition: Secondary | ICD-10-CM | POA: Diagnosis not present

## 2020-04-27 LAB — CBC WITH DIFFERENTIAL/PLATELET
Abs Immature Granulocytes: 0.02 10*3/uL (ref 0.00–0.07)
Basophils Absolute: 0 10*3/uL (ref 0.0–0.1)
Basophils Relative: 1 %
Eosinophils Absolute: 0.1 10*3/uL (ref 0.0–0.5)
Eosinophils Relative: 2 %
HCT: 45.1 % (ref 39.0–52.0)
Hemoglobin: 14.9 g/dL (ref 13.0–17.0)
Immature Granulocytes: 0 %
Lymphocytes Relative: 18 %
Lymphs Abs: 1.2 10*3/uL (ref 0.7–4.0)
MCH: 31 pg (ref 26.0–34.0)
MCHC: 33 g/dL (ref 30.0–36.0)
MCV: 94 fL (ref 80.0–100.0)
Monocytes Absolute: 0.5 10*3/uL (ref 0.1–1.0)
Monocytes Relative: 8 %
Neutro Abs: 4.8 10*3/uL (ref 1.7–7.7)
Neutrophils Relative %: 71 %
Platelets: 295 10*3/uL (ref 150–400)
RBC: 4.8 MIL/uL (ref 4.22–5.81)
RDW: 13.3 % (ref 11.5–15.5)
WBC: 6.7 10*3/uL (ref 4.0–10.5)
nRBC: 0 % (ref 0.0–0.2)

## 2020-04-27 LAB — COMPREHENSIVE METABOLIC PANEL
ALT: 27 U/L (ref 0–44)
AST: 16 U/L (ref 15–41)
Albumin: 3.6 g/dL (ref 3.5–5.0)
Alkaline Phosphatase: 85 U/L (ref 38–126)
Anion gap: 10 (ref 5–15)
BUN: 26 mg/dL — ABNORMAL HIGH (ref 8–23)
CO2: 27 mmol/L (ref 22–32)
Calcium: 9.2 mg/dL (ref 8.9–10.3)
Chloride: 102 mmol/L (ref 98–111)
Creatinine, Ser: 0.91 mg/dL (ref 0.61–1.24)
GFR calc Af Amer: >60 mL/min (ref >60)
GFR calc non Af Amer: >60 mL/min (ref >60)
Glucose, Bld: 105 mg/dL — ABNORMAL HIGH (ref 70–99)
Potassium: 4.8 mmol/L (ref 3.5–5.1)
Sodium: 139 mmol/L (ref 135–145)
Total Bilirubin: 1 mg/dL (ref 0.3–1.2)
Total Protein: 6.4 g/dL — ABNORMAL LOW (ref 6.5–8.1)

## 2020-04-27 LAB — LACTATE DEHYDROGENASE: LDH: 117 U/L (ref 98–192)

## 2020-04-27 LAB — VITAMIN B12: Vitamin B-12: 894 pg/mL (ref 180–914)

## 2020-04-27 LAB — VITAMIN D 25 HYDROXY (VIT D DEFICIENCY, FRACTURES): Vit D, 25-Hydroxy: 43.87 ng/mL (ref 30–100)

## 2020-04-28 DIAGNOSIS — Z933 Colostomy status: Secondary | ICD-10-CM | POA: Diagnosis not present

## 2020-04-28 DIAGNOSIS — Z85038 Personal history of other malignant neoplasm of large intestine: Secondary | ICD-10-CM | POA: Diagnosis not present

## 2020-04-28 LAB — CEA: CEA: 3.9 ng/mL (ref 0.0–4.7)

## 2020-05-02 ENCOUNTER — Other Ambulatory Visit: Payer: Self-pay | Admitting: Family Medicine

## 2020-05-03 ENCOUNTER — Inpatient Hospital Stay (HOSPITAL_COMMUNITY): Payer: Medicare HMO | Admitting: Hematology

## 2020-05-03 ENCOUNTER — Other Ambulatory Visit: Payer: Self-pay

## 2020-05-03 VITALS — BP 142/94 | HR 68 | Temp 97.5°F | Resp 18 | Wt 197.3 lb

## 2020-05-03 DIAGNOSIS — C20 Malignant neoplasm of rectum: Secondary | ICD-10-CM

## 2020-05-03 NOTE — Progress Notes (Signed)
[facility], [place] [address]   CLINIC:  Medical Oncology/Hematology  PCP:  Erven Colla, DO [address] / [place] [address] [phone]   REASON FOR VISIT:  Follow-up for rectal cancer  PRIOR THERAPY: Low anterior resection with ileostomy on 03/20/2014  NGS Results: Not done  CURRENT THERAPY: Surveillance  BRIEF ONCOLOGIC HISTORY:  Oncology History  Rectal cancer, uT2uN0, s/p LAR/ileostomy 03/20/2014 colostomy Aug2015  12/25/2013 Initial Diagnosis   Rectal cancer, posterior 4cm from anal verge     CANCER STAGING: Cancer Staging Rectal cancer, uT2uN0, s/p LAR/ileostomy 03/20/2014 colostomy Aug2015 Staging form: Colon and Rectum, AJCC 7th Edition - Clinical: T2, N0 - Unsigned   INTERVAL HISTORY:  [name], a 64 y.o. male, returns for routine follow-up of his rectal cancer. [name] was last seen on 10/25/2018.  Today he reports feeling well. His ileostomy bag is intact and he gets occasional abdominal pain, but he denies hematochezia. His SOB is stable and he denies leg swelling. He denies having any new aches or pains.  He will be seeing a surgeon for hernia correction at some time in the future.   REVIEW OF SYSTEMS:  Review of Systems  Constitutional: Negative for appetite change and fatigue.  Respiratory: Positive for shortness of breath (w/ exertion).   Cardiovascular: Negative for leg swelling.  Gastrointestinal: Positive for abdominal pain. Negative for blood in stool.  Genitourinary: Positive for frequency.   Musculoskeletal: Negative for arthralgias and myalgias.  All other systems reviewed and are negative.   PAST MEDICAL/SURGICAL HISTORY:  Past Medical History:  Diagnosis Date  . Arthritis   . Aspiration pneumonia ([place]) 03/24/2014  . Colostomy in place [facility])   . Difficulty urinating   . GERD (gastroesophageal reflux disease)   . Hypertension   . Ileostomy present ([place])   . Rectal cancer,  uT2uN0, s/p LAR/ileostomy 03/20/2014 colostomy Aug2015 12/25/2013   TCS MAY 2015   . Shortening, leg, congenital    Left  . Skin irritation    Surrounding ostomy site   Past Surgical History:  Procedure Laterality Date  . COLON RESECTION N/A 03/24/2014   Procedure: Diagnostic  laparotomy, takedown of coloanal anastamosis, end colostomy, with wound vac;  Surgeon: Adin Hector, MD;  Location: [facility];  Service: General;  Laterality: N/A;  . COLONOSCOPY N/A 12/22/2013   SLF: 1. Normal mucosa in the terminal ilium. 2. Moderate diverticulosis in the transverse colon, descending colon, and sigmoid colon. 3. Rectal bleeding due ot rectal mass and hemorrhoids 4. Moderate sized internal hemorrhoids.   . COLONOSCOPY N/A 01/19/2015   Procedure: COLONOSCOPY;  Surgeon: Danie Binder, MD;  Location: [facility];  Service: Endoscopy;  Laterality: N/A;  1215  . COLONOSCOPY N/A 03/22/2018   Procedure: COLONOSCOPY;  Surgeon: Danie Binder, MD;  [facility];  Service: Endoscopy;  Laterality: N/A;  2:15pm  . COLOSTOMY    . ESOPHAGOGASTRODUODENOSCOPY N/A 12/22/2013   SLF: Probable Candia esophagitis2. Probable Barretts esphagus 3. Mild gastritis.   . EUS N/A 01/01/2014   Procedure: LOWER ENDOSCOPIC ULTRASOUND (EUS);  Surgeon: Milus Banister, MD;  Location: [facility];  Service: Endoscopy;  Laterality: N/A;  . EXAMINATION UNDER ANESTHESIA  08/25/2014   Procedure: EXAM UNDER ANESTHESIA;  Surgeon: Michael Boston, MD;  Location: [facility];  Service: General;;  . eye reattachment and ear reattachment  Right 1971  . FLEXIBLE SIGMOIDOSCOPY N/A 03/24/2014   Procedure: FLEXIBLE SIGMOIDOSCOPY;  Surgeon: Revonda Standard.  Gross, MD;  Location: [facility];  Service: General;  Laterality: N/A;  . HIP FRACTURE SURGERY Left age 3 on 04-06-1970  . ILEOSTOMY    . ILEOSTOMY CLOSURE N/A 08/25/2014   Procedure: TAKEDOWN OF LOOP ILEOSTOMY ;  Surgeon: Michael Boston, MD;  Location: [facility];  Service: General;  Laterality: N/A;  . INSERTION OF  MESH N/A 04/11/2017   Procedure: INSERTION OF MESH ;  Surgeon: Michael Boston, MD;  Location: [facility];  Service: General;  Laterality: N/A;  BILATERAL TAP BLOCK  . TOTAL HIP ARTHROPLASTY Left 11/19/2015   Procedure: LEFT TOTAL HIP ARTHROPLASTY ANTERIOR APPROACH;  Surgeon: Mcarthur Rossetti, MD;  Location: [facility];  Service: Orthopedics;  Laterality: Left;  Marland Kitchen VENTRAL HERNIA REPAIR N/A 04/11/2017   Procedure: LAPAROSCOPIC INCARCERATED PERISTOMAL AND INCARCERATED INCISIONAL HERNIA X2;  Surgeon: Michael Boston, MD;  Location: [facility];  Service: General;  Laterality: N/A;  BILATERAL TAP BLOCK    SOCIAL HISTORY:  Social History   Socioeconomic History  . Marital status: Married    Spouse name: Not on file  . Number of children: Not on file  . Years of education: Not on file  . Highest education level: Not on file  Occupational History  . Not on file  Tobacco Use  . Smoking status: Former Smoker    Packs/day: 0.00    Years: 15.00    Pack years: 0.00    Types: Cigars    Quit date: 09/10/2011    Years since quitting: 8.6  . Smokeless tobacco: Never Used  Vaping Use  . Vaping Use: Never used  Substance and Sexual Activity  . Alcohol use: No    Comment: quit years ago  . Drug use: No  . Sexual activity: Not on file  Other Topics Concern  . 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   Social Determinants of Health   Financial Resource Strain:   . Difficulty of Paying Living Expenses: Not on file  Food Insecurity:   . Worried About Charity fundraiser in the Last Year: Not on file  . Ran Out of Food in the Last Year: Not on file  Transportation Needs:   . Lack of Transportation (Medical): Not on file  . Lack of Transportation (Non-Medical): Not on file  Physical Activity:   . Days of Exercise per Week: Not on file  . Minutes of Exercise per Session: Not on file  Stress:   . Feeling of Stress : Not on file  Social Connections:   . Frequency of  Communication with Friends and Family: Not on file  . Frequency of Social Gatherings with Friends and Family: Not on file  . Attends Religious Services: Not on file  . Active Member of Clubs or Organizations: Not on file  . Attends Archivist Meetings: Not on file  . Marital Status: Not on file  Intimate Partner Violence:   . Fear of Current or Ex-Partner: Not on file  . Emotionally Abused: Not on file  . Physically Abused: Not on file  . Sexually Abused: Not on file    FAMILY HISTORY:  Family History  Problem Relation Age of Onset  . Colon cancer Sister   . Emphysema Mother   . Heart attack Brother   . Colon polyps Neg Hx     CURRENT MEDICATIONS:  Current Outpatient Medications  Medication Sig Dispense Refill  . amLODipine (NORVASC) 5 MG tablet Take 1 tablet (  5 mg total) by mouth daily. 90 tablet 0  . aspirin EC 81 MG tablet Take 81 mg by mouth daily.    . Beta Carotene (VITAMIN A) 25000 UNIT capsule Take 25,000 Units by mouth daily.    Marland Kitchen co-enzyme Q-10 30 MG capsule Take 30 mg by mouth 2 (two) times daily.    Marland Kitchen ketoconazole (NIZORAL) 2 % cream Apply 1 application topically daily. Apply to rash on neck. 30 g 0  . LUTEIN PO Take 1 tablet by mouth 2 (two) times daily.     . metoprolol tartrate (LOPRESSOR) 25 MG tablet Take 1 tablet (25 mg total) by mouth 2 (two) times daily. 180 tablet 0  . Multiple Vitamins-Minerals (ONE DAILY FOR MEN 50+ ADVANCED PO) Take 1 tablet by mouth daily.     . naproxen (NAPROSYN) 500 MG tablet Take 1 tablet (500 mg total) by mouth 2 (two) times daily with a meal. 45 tablet 0  . omeprazole (PRILOSEC) 40 MG capsule TAKE 1 CAPSULE BY MOUTH EVERY DAY 90 capsule 2  . OVER THE COUNTER MEDICATION Take 1 tablet by mouth 2 (two) times daily. Vinegar tablet by mouth twice a day     . saw palmetto 500 MG capsule Take 500 mg by mouth 2 (two) times daily.    . Turmeric 500 MG CAPS Take 500 mg by mouth 2 (two) times daily.     . vitamin B-12  (CYANOCOBALAMIN) 500 MCG tablet Take 1,000 mcg by mouth daily.    . vitamin C (ASCORBIC ACID) 500 MG tablet Take 500 mg by mouth 2 (two) times daily.     No current facility-administered medications for this visit.    ALLERGIES:  No Known Allergies  PHYSICAL EXAM:  Performance status (ECOG): 1 - Symptomatic but completely ambulatory  Vitals:   05/03/20 1101  BP: (!) 142/94  Pulse: 68  Resp: 18  Temp: (!) 97.5 F (36.4 C)  SpO2: 97%   Wt Readings from Last 3 Encounters:  05/03/20 197 lb 5 oz (89.5 kg)  04/14/20 195 lb 9.6 oz (88.7 kg)  05/02/19 193 lb 8 oz (87.8 kg)   Physical Exam Vitals reviewed.  Constitutional:      Appearance: Normal appearance. He is obese.  Cardiovascular:     Rate and Rhythm: Normal rate and regular rhythm.     Pulses: Normal pulses.     Heart sounds: Normal heart sounds.  Pulmonary:     Effort: Pulmonary effort is normal.     Breath sounds: Normal breath sounds.  Abdominal:     Palpations: Abdomen is soft.     Tenderness: There is no abdominal tenderness.  Musculoskeletal:     Right lower leg: No edema.     Left lower leg: No edema.  Neurological:     General: No focal deficit present.     Mental Status: He is alert and oriented to person, place, and time.  Psychiatric:        Mood and Affect: Mood normal.        Behavior: Behavior normal.      LABORATORY DATA:  I have reviewed the labs as listed.  CBC Latest Ref Rng & Units 04/27/2020 04/08/2020 04/29/2019  WBC 4.0 - 10.5 K/uL 6.7 7.6 6.2  Hemoglobin 13.0 - 17.0 g/dL 14.9 15.9 14.6  Hematocrit 39 - 52 % 45.1 47.9 43.6  Platelets 150 - 400 K/uL 295 343 283   CMP Latest Ref Rng & Units 04/27/2020 04/08/2020 04/29/2019  Glucose  70 - 99 mg/dL 105(H) 101(H) 94  BUN 8 - 23 mg/dL 26(H) 22 23  Creatinine 0.61 - 1.24 mg/dL 0.91 0.95 0.93  Sodium 135 - 145 mmol/L 139 138 136  Potassium 3.5 - 5.1 mmol/L 4.8 5.0 4.5  Chloride 98 - 111 mmol/L 102 101 101  CO2 22 - 32 mmol/L _0 Calcium 8.9  - 10.3 mg/dL 9.2 9.5 8.7(L)  Total Protein 6.5 - 8.1 g/dL 6.4(L) 6.5 6.3(L)  Total Bilirubin 0.3 - 1.2 mg/dL 1.0 0.4 0.7  Alkaline Phos 38 - 126 U/L 85 124(H) 71  AST 15 - 41 U/L _1 ALT 0 - 44 U/L 27 35 28   Lab Results  Component Value Date   LDH 117 04/27/2020   Lab Results  Component Value Date   VD25OH 43.87 04/27/2020   Lab Results  Component Value Date   CEA1 3.9 04/27/2020   CEA1 4.1 04/08/2020   CEA1 3.3 04/29/2019    DIAGNOSTIC IMAGING:  I have independently reviewed the scans and discussed with the patient. No results found.   ASSESSMENT:  1.  Stage II (PT 2 PN 0) rectal adenocarcinoma: - Status post low anterior resection with ileostomy on 03/20/2014, pathology showing grade 2 moderately differentiated adenocarcinoma involving distal one third of the rectum, MMR normal. -PET CT scan prior to surgical resection on 01/09/2014 negative for metastatic disease. -Takedown of loop ileostomy in January 2016. - Colonoscopy on 03/22/2018 shows diverticulosis in the transverse colon and in the ascending colon.  Has close to 35 cm of colon remaining.  Surgical stoma is normal. -Family history significant for sister having colon cancer at age 3 and died of it. - CT of the abdomen and pelvis on 04/16/2018 shows interval reduction of peristomal hernia of small bowel.  No evidence of recurrence of his cancer.   PLAN:  1.  Stage II (PT 2 PN 0) rectal adenocarcinoma: -Denies any change in consistency of bowels or blood in the colostomy bag. -She is having problems with hernia at the PEG site. -Reviewed his labs from 04/27/2020.  LFTs and CEA was normal.  Hemoglobin 14.9. -He will follow up with Dr. Johney Maine for hernia. -He will come back to see me in 1 year with repeat labs including CEA.    Orders placed this encounter:  No orders of the defined types were placed in this encounter.    Derek Jack, MD [place] [phone]   I, Milinda Antis,  am acting as a scribe for Dr. Sanda Linger.  I, Derek Jack MD, have reviewed the above documentation for accuracy and completeness, and I agree with the above.

## 2020-05-03 NOTE — Patient Instructions (Signed)
[facility] at [facility] Discharge Instructions  You were seen today by Dr. Katragadda. He went over your recent results. Dr. Katragadda will see you back in 1 year for labs and follow up.   Thank you for choosing [facility] at [facility] to provide your oncology and hematology care.  To afford each patient quality time with our provider, please arrive at least 15 minutes before your scheduled appointment time.   If you have a lab appointment with the Cancer Center please come in thru the Main Entrance and check in at the main information desk  You need to re-schedule your appointment should you arrive 10 or more minutes late.  We strive to give you quality time with our providers, and arriving late affects you and other patients whose appointments are after yours.  Also, if you no show three or more times for appointments you may be dismissed from the clinic at the providers discretion.     Again, thank you for choosing [facility].  Our hope is that these requests will decrease the amount of time that you wait before being seen by our physicians.       _____________________________________________________________  Should you have questions after your visit to [facility], please contact our office at [phone] between the hours of 8:00 a.m. and 4:30 p.m.  Voicemails left after 4:00 p.m. will not be returned until the following business day.  For prescription refill requests, have your pharmacy contact our office and allow 72 hours.    Cancer Center Support Programs:   > Cancer Support Group  2nd Tuesday of the month 1pm-2pm, Journey Room    

## 2020-05-12 DIAGNOSIS — Z933 Colostomy status: Secondary | ICD-10-CM | POA: Diagnosis not present

## 2020-05-12 DIAGNOSIS — Z85038 Personal history of other malignant neoplasm of large intestine: Secondary | ICD-10-CM | POA: Diagnosis not present

## 2020-05-13 DIAGNOSIS — Z20828 Contact with and (suspected) exposure to other viral communicable diseases: Secondary | ICD-10-CM | POA: Diagnosis not present

## 2020-05-19 ENCOUNTER — Telehealth: Payer: Self-pay | Admitting: Family Medicine

## 2020-05-19 NOTE — Telephone Encounter (Signed)
[place] partially approved some of pt ostomy supplies. 300 skin barrier wipes were denied. Pt states they sent him enough to last. Pt does need more in the summer time due to sweating more. In the summer he goes through more. [name] is only allowing partial supplies at this time. Pt is aware and verbalized understanding.

## 2020-05-30 ENCOUNTER — Other Ambulatory Visit: Payer: Self-pay | Admitting: Family Medicine

## 2020-05-31 ENCOUNTER — Other Ambulatory Visit (HOSPITAL_COMMUNITY): Payer: Self-pay | Admitting: Surgery

## 2020-05-31 ENCOUNTER — Other Ambulatory Visit: Payer: Self-pay | Admitting: Surgery

## 2020-05-31 DIAGNOSIS — K435 Parastomal hernia without obstruction or  gangrene: Secondary | ICD-10-CM

## 2020-06-10 ENCOUNTER — Ambulatory Visit (HOSPITAL_COMMUNITY)
Admission: RE | Admit: 2020-06-10 | Discharge: 2020-06-10 | Disposition: A | Discharge status: Home / Self Care | Payer: Medicare HMO | Source: Ambulatory Visit | Attending: Surgery | Admitting: Surgery

## 2020-06-10 ENCOUNTER — Encounter (HOSPITAL_COMMUNITY): Payer: Self-pay

## 2020-06-10 ENCOUNTER — Other Ambulatory Visit: Payer: Self-pay

## 2020-06-10 DIAGNOSIS — R109 Unspecified abdominal pain: Secondary | ICD-10-CM | POA: Diagnosis not present

## 2020-06-10 DIAGNOSIS — K435 Parastomal hernia without obstruction or  gangrene: Secondary | ICD-10-CM

## 2020-06-10 LAB — POCT I-STAT CREATININE: Creatinine, Ser: 1 mg/dL (ref 0.61–1.24)

## 2020-06-10 MED ORDER — IOHEXOL 300 MG/ML  SOLN
100.0000 mL | Freq: Once | INTRAMUSCULAR | Status: AC | PRN
  Administered 2020-06-10: 100 mL via INTRAVENOUS

## 2020-06-14 ENCOUNTER — Ambulatory Visit: Payer: Self-pay | Admitting: Surgery

## 2020-06-14 DIAGNOSIS — R1031 Right lower quadrant pain: Secondary | ICD-10-CM | POA: Diagnosis not present

## 2020-06-14 DIAGNOSIS — Z933 Colostomy status: Secondary | ICD-10-CM | POA: Diagnosis not present

## 2020-06-14 DIAGNOSIS — K430 Incisional hernia with obstruction, without gangrene: Secondary | ICD-10-CM | POA: Diagnosis not present

## 2020-08-04 DIAGNOSIS — R69 Illness, unspecified: Secondary | ICD-10-CM | POA: Diagnosis not present

## 2020-08-09 ENCOUNTER — Telehealth: Payer: Self-pay | Admitting: Family Medicine

## 2020-08-09 MED ORDER — OMEPRAZOLE 40 MG PO CPDR
40.0000 mg | DELAYED_RELEASE_CAPSULE | Freq: Every day | ORAL | 0 refills | Status: DC
Start: 2020-08-09 — End: 2020-09-29

## 2020-08-09 NOTE — Telephone Encounter (Signed)
Patient is requesting refill on omeprazole 40 mg sent to [facility] [place]

## 2020-08-09 NOTE — Telephone Encounter (Signed)
Prescription sent electronically to pharmacy. Patient notified. 

## 2020-08-12 DIAGNOSIS — Z85038 Personal history of other malignant neoplasm of large intestine: Secondary | ICD-10-CM | POA: Diagnosis not present

## 2020-08-12 DIAGNOSIS — Z933 Colostomy status: Secondary | ICD-10-CM | POA: Diagnosis not present

## 2020-08-18 ENCOUNTER — Other Ambulatory Visit: Payer: Self-pay | Admitting: Family Medicine

## 2020-08-18 NOTE — Telephone Encounter (Signed)
Last seen 04/14/20

## 2020-09-29 ENCOUNTER — Telehealth: Payer: Self-pay | Admitting: Family Medicine

## 2020-09-29 MED ORDER — OMEPRAZOLE 40 MG PO CPDR
40.0000 mg | DELAYED_RELEASE_CAPSULE | Freq: Every day | ORAL | 0 refills | Status: DC
Start: 2020-09-29 — End: 2021-02-04

## 2020-09-29 NOTE — Telephone Encounter (Signed)
Pharmacy requesting refill on Omeprazole 40 mg capsule. Take one capsule po daily. Pt has upcoming appt. Please advise. Thank you

## 2020-10-06 DIAGNOSIS — Z933 Colostomy status: Secondary | ICD-10-CM | POA: Diagnosis not present

## 2020-10-06 DIAGNOSIS — Z85038 Personal history of other malignant neoplasm of large intestine: Secondary | ICD-10-CM | POA: Diagnosis not present

## 2020-10-13 ENCOUNTER — Ambulatory Visit: Payer: Medicare HMO | Admitting: Family Medicine

## 2020-11-04 DIAGNOSIS — K219 Gastro-esophageal reflux disease without esophagitis: Secondary | ICD-10-CM | POA: Diagnosis not present

## 2020-11-04 DIAGNOSIS — Z7982 Long term (current) use of aspirin: Secondary | ICD-10-CM | POA: Diagnosis not present

## 2020-11-04 DIAGNOSIS — N529 Male erectile dysfunction, unspecified: Secondary | ICD-10-CM | POA: Diagnosis not present

## 2020-11-04 DIAGNOSIS — Z791 Long term (current) use of non-steroidal anti-inflammatories (NSAID): Secondary | ICD-10-CM | POA: Diagnosis not present

## 2020-11-04 DIAGNOSIS — G8929 Other chronic pain: Secondary | ICD-10-CM | POA: Diagnosis not present

## 2020-11-04 DIAGNOSIS — Z809 Family history of malignant neoplasm, unspecified: Secondary | ICD-10-CM | POA: Diagnosis not present

## 2020-11-04 DIAGNOSIS — Z008 Encounter for other general examination: Secondary | ICD-10-CM | POA: Diagnosis not present

## 2020-11-04 DIAGNOSIS — Z933 Colostomy status: Secondary | ICD-10-CM | POA: Diagnosis not present

## 2020-11-04 DIAGNOSIS — I10 Essential (primary) hypertension: Secondary | ICD-10-CM | POA: Diagnosis not present

## 2020-11-04 DIAGNOSIS — E669 Obesity, unspecified: Secondary | ICD-10-CM | POA: Diagnosis not present

## 2020-11-04 DIAGNOSIS — Z6833 Body mass index (BMI) 33.0-33.9, adult: Secondary | ICD-10-CM | POA: Diagnosis not present

## 2020-11-05 DIAGNOSIS — Z933 Colostomy status: Secondary | ICD-10-CM | POA: Diagnosis not present

## 2020-11-05 DIAGNOSIS — Z85038 Personal history of other malignant neoplasm of large intestine: Secondary | ICD-10-CM | POA: Diagnosis not present

## 2020-11-18 ENCOUNTER — Ambulatory Visit: Payer: Medicare HMO | Admitting: Family Medicine

## 2020-11-19 ENCOUNTER — Encounter: Payer: Self-pay | Admitting: Family Medicine

## 2020-11-19 ENCOUNTER — Ambulatory Visit (INDEPENDENT_AMBULATORY_CARE_PROVIDER_SITE_OTHER): Payer: Medicare HMO | Admitting: Family Medicine

## 2020-11-19 ENCOUNTER — Other Ambulatory Visit: Payer: Self-pay

## 2020-11-19 VITALS — BP 158/90 | HR 59 | Temp 97.2°F | Ht 65.0 in | Wt 204.0 lb

## 2020-11-19 DIAGNOSIS — Z13220 Encounter for screening for lipoid disorders: Secondary | ICD-10-CM | POA: Diagnosis not present

## 2020-11-19 DIAGNOSIS — Z933 Colostomy status: Secondary | ICD-10-CM | POA: Diagnosis not present

## 2020-11-19 DIAGNOSIS — I10 Essential (primary) hypertension: Secondary | ICD-10-CM

## 2020-11-19 DIAGNOSIS — R252 Cramp and spasm: Secondary | ICD-10-CM

## 2020-11-19 MED ORDER — AMLODIPINE BESYLATE 5 MG PO TABS
5.0000 mg | ORAL_TABLET | Freq: Every day | ORAL | 0 refills | Status: DC
Start: 2020-11-19 — End: 2021-02-14

## 2020-11-19 MED ORDER — BACLOFEN 10 MG PO TABS: 10.0000 mg | ORAL_TABLET | Freq: Every day | ORAL | 0 refills | Status: DC

## 2020-11-19 MED ORDER — METOPROLOL TARTRATE 25 MG PO TABS
25.0000 mg | ORAL_TABLET | Freq: Two times a day (BID) | ORAL | 1 refills | Status: DC
Start: 2020-11-19 — End: 2021-04-04

## 2020-11-19 NOTE — Progress Notes (Signed)
Patient ID: [name], male    DOB: [birth year], 65 y.o.   MRN: [id number]   Chief Complaint  Patient presents with  . Hypertension   Subjective:    HPI  CC-htn check up. Takes amlodipine and metoprolol.   Needs refill on naproxen.   Cramps in legs and left leg swollen for about one year.   Seeing chiropractor for 6 wks, and having leg cramping. Used to have hand cramping and has been improving. Cramping in inner thigh.  And in left calf was swelling but improved.   Does still have some hernia pain in abdomen. Has gained some weight and they flares up some. Surgeon said could do surgery, but pt wanting to wait, so is dealing with some pain.  And if he dec weight it's helpful.  H/o rectal cancer s/p ileostomy and colostomy- Using colostomy bag.  In summer sweating and the rings/wafer puts one on per day. Has to exchange it due to sweating and being active in summer. In winter- doesn't go over 4 days with the ring/wafer. 1 box of bag lasting 1.5-2 months. 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   120/80 at home a home health nurse checked it.    Medical History [name] has a past medical history of Arthritis, Aspiration pneumonia ([place]) (03/24/2014), Colostomy in place [facility]), Difficulty urinating, GERD (gastroesophageal reflux disease), Hypertension, Ileostomy present ([place]), Rectal cancer, uT2uN0, s/p LAR/ileostomy 03/20/2014 colostomy [id number] (12/25/2013), Shortening, leg, congenital, and Skin irritation.   Outpatient Encounter Medications as of 11/19/2020  Medication Sig  . baclofen (LIORESAL) 10 MG tablet Take 1 tablet (10 mg total) by mouth at bedtime. For leg cramping  . amLODipine (NORVASC) 5 MG tablet Take 1 tablet (5 mg total) by mouth daily.  Marland Kitchen aspirin EC 81 MG tablet Take 81 mg by mouth daily.  . Beta Carotene (VITAMIN A) 25000 UNIT capsule Take 25,000 Units by mouth daily.  Marland Kitchen co-enzyme Q-10 30 MG capsule Take 30 mg by mouth 2 (two) times daily.  Marland Kitchen  ketoconazole (NIZORAL) 2 % cream Apply 1 application topically daily. Apply to rash on neck.  . LUTEIN PO Take 1 tablet by mouth 2 (two) times daily.   . metoprolol tartrate (LOPRESSOR) 25 MG tablet Take 1 tablet (25 mg total) by mouth 2 (two) times daily.  . Multiple Vitamins-Minerals (ONE DAILY FOR MEN 50+ ADVANCED PO) Take 1 tablet by mouth daily.   . naproxen (NAPROSYN) 500 MG tablet TAKE 1 TABLET (500 MG TOTAL) BY MOUTH 2 (TWO) TIMES DAILY WITH A MEAL  . omeprazole (PRILOSEC) 40 MG capsule Take 1 capsule (40 mg total) by mouth daily.  Marland Kitchen OVER THE COUNTER MEDICATION Take 1 tablet by mouth 2 (two) times daily. Vinegar tablet by mouth twice a day   . saw palmetto 500 MG capsule Take 500 mg by mouth 2 (two) times daily.  . Turmeric 500 MG CAPS Take 500 mg by mouth 2 (two) times daily.   . vitamin B-12 (CYANOCOBALAMIN) 500 MCG tablet Take 1,000 mcg by mouth daily.  . vitamin C (ASCORBIC ACID) 500 MG tablet Take 500 mg by mouth 2 (two) times daily.  . [DISCONTINUED] amLODipine (NORVASC) 5 MG tablet Take 1 tablet (5 mg total) by mouth daily.  . [DISCONTINUED] metoprolol tartrate (LOPRESSOR) 25 MG tablet TAKE 1 TABLET BY MOUTH TWICE A DAY   No facility-administered encounter medications on file as of 11/19/2020.     Review of Systems  Constitutional: Negative  for chills and fever.  HENT: Negative for congestion, rhinorrhea and sore throat.   Respiratory: Negative for cough, shortness of breath and wheezing.   Cardiovascular: Negative for chest pain and leg swelling.  Gastrointestinal: Negative for abdominal pain, diarrhea, nausea and vomiting.  Genitourinary: Negative for dysuria and frequency.  Musculoskeletal: Negative for arthralgias and back pain.       +calf cramping-left  Skin: Negative for rash.  Neurological: Negative for dizziness, weakness and headaches.     Vitals BP (!) 158/90   Pulse (!) 59   Temp (!) 97.2 F (36.2 C)   Ht '5\' 5"'  (1.651 m)   Wt 204 lb (92.5 kg)   SpO2 97%    BMI 33.95 kg/m   Objective:   Physical Exam Vitals and nursing note reviewed.  Constitutional:      General: He is not in acute distress.    Appearance: Normal appearance. He is not ill-appearing.  HENT:     Head: Normocephalic.     Nose: Nose normal. No congestion.     Mouth/Throat:     Mouth: Mucous membranes are moist.     Pharynx: No oropharyngeal exudate.  Eyes:     Extraocular Movements: Extraocular movements intact.     Conjunctiva/sclera: Conjunctivae normal.     Pupils: Pupils are equal, round, and reactive to light.  Cardiovascular:     Rate and Rhythm: Normal rate and regular rhythm.     Pulses: Normal pulses.     Heart sounds: Normal heart sounds. No murmur heard.   Pulmonary:     Effort: Pulmonary effort is normal.     Breath sounds: Normal breath sounds. No wheezing, rhonchi or rales.  Abdominal:     General: Bowel sounds are normal. There is no distension.     Palpations: Abdomen is soft. There is no mass.     Tenderness: There is no abdominal tenderness. There is no guarding or rebound.     Hernia: A hernia is present.     Comments: +colostomy bag in place  Musculoskeletal:        General: No swelling or tenderness. Normal range of motion.     Right lower leg: No edema.     Left lower leg: No edema.  Skin:    General: Skin is warm and dry.     Findings: No rash.  Neurological:     General: No focal deficit present.     Mental Status: He is alert and oriented to person, place, and time.     Cranial Nerves: No cranial nerve deficit.  Psychiatric:        Mood and Affect: Mood normal.        Behavior: Behavior normal.        Thought Content: Thought content normal.        Judgment: Judgment normal.    Assessment and Plan   1. Essential hypertension, benign - CBC - CMP14+EGFR - Lipid panel  2. Lipid screening - Lipid panel  3. Leg cramping  4. Colostomy in place [facility])   htn-uncontrolled.  Doesn't appear pt is taking the norvasc, last  fill was 7/21, would have ran out in 10/21.  Will re-order and see if this helps with his bp. Cont with metoprolol bid.  H/o rectal cancer s/p colostomy- Colostomy in place- pt using supplies as noted above.   HLD screen- will recheck labs.  Leg cramping- normal labs. Cont with chiropractic prn. Will try baclofen prn for muscle spasms.  Return in about 6 months (around 05/21/2021) for f/u htn.  11/22/2020

## 2020-11-20 LAB — CMP14+EGFR
ALT: 20 IU/L (ref 0–44)
AST: 16 IU/L (ref 0–40)
Albumin/Globulin Ratio: 1.4 (ref 1.2–2.2)
Albumin: 3.8 g/dL (ref 3.8–4.8)
Alkaline Phosphatase: 119 IU/L (ref 44–121)
BUN/Creatinine Ratio: 21 (ref 10–24)
BUN: 18 mg/dL (ref 8–27)
Bilirubin Total: 0.3 mg/dL (ref 0.0–1.2)
CO2: 23 mmol/L (ref 20–29)
Calcium: 9.5 mg/dL (ref 8.6–10.2)
Chloride: 102 mmol/L (ref 96–106)
Creatinine, Ser: 0.87 mg/dL (ref 0.76–1.27)
Globulin, Total: 2.7 g/dL (ref 1.5–4.5)
Glucose: 73 mg/dL (ref 65–99)
Potassium: 4.5 mmol/L (ref 3.5–5.2)
Sodium: 140 mmol/L (ref 134–144)
Total Protein: 6.5 g/dL (ref 6.0–8.5)
eGFR: 96 mL/min/{1.73_m2} (ref >59)

## 2020-11-20 LAB — LIPID PANEL
Chol/HDL Ratio: 5.1 ratio — ABNORMAL HIGH (ref 0.0–5.0)
Cholesterol, Total: 183 mg/dL (ref 100–199)
HDL: 36 mg/dL — ABNORMAL LOW (ref >39)
LDL Chol Calc (NIH): 124 mg/dL — ABNORMAL HIGH (ref 0–99)
Triglycerides: 124 mg/dL (ref 0–149)
VLDL Cholesterol Cal: 23 mg/dL (ref 5–40)

## 2020-11-20 LAB — CBC
Hematocrit: 43.3 % (ref 37.5–51.0)
Hemoglobin: 14.9 g/dL (ref 13.0–17.7)
MCH: 30.3 pg (ref 26.6–33.0)
MCHC: 34.4 g/dL (ref 31.5–35.7)
MCV: 88 fL (ref 79–97)
Platelets: 317 10*3/uL (ref 150–450)
RBC: 4.92 x10E6/uL (ref 4.14–5.80)
RDW: 12.6 % (ref 11.6–15.4)
WBC: 8.2 10*3/uL (ref 3.4–10.8)

## 2020-12-11 ENCOUNTER — Other Ambulatory Visit: Payer: Self-pay | Admitting: Family Medicine

## 2020-12-13 NOTE — Telephone Encounter (Signed)
Seen 4/1 for leg cramps and prescribed this med. Not sure if this is something you want to refill or not

## 2021-01-04 DIAGNOSIS — Z933 Colostomy status: Secondary | ICD-10-CM | POA: Diagnosis not present

## 2021-01-04 DIAGNOSIS — Z85038 Personal history of other malignant neoplasm of large intestine: Secondary | ICD-10-CM | POA: Diagnosis not present

## 2021-01-31 NOTE — Progress Notes (Signed)
Subjective:   [name] is a 65 y.o. male who presents for Medicare Annual/Subsequent preventive examination.  I connected with [name]  today by telephone and verified that I am speaking with the correct person using two identifiers. Location patient: home Location provider: work Persons participating in the virtual visit: patient, provider.   I discussed the limitations, risks, security and privacy concerns of performing an evaluation and management service by telephone and the availability of in person appointments. I also discussed with the patient that there may be a patient responsible charge related to this service. The patient expressed understanding and verbally consented to this telephonic visit.    Interactive audio and video telecommunications were attempted between this provider and patient, however failed, due to patient having technical difficulties OR patient did not have access to video capability.  We continued and completed visit with audio only.     Review of Systems    N/A  Cardiac Risk Factors include: advanced age (>3men, >6 women);male gender;hypertension     Objective:    Today's Vitals   There is no height or weight on file to calculate BMI.  Advanced Directives 02/01/2021 05/03/2020 05/02/2019 10/25/2018 08/09/2018 04/18/2018 04/03/2018  Does Patient Have a Medical Advance Directive? Yes Yes No No No Yes Yes  Type of Paramedic of [place];Living will Living will;Healthcare Power of [place] will Living will  Does patient want to make changes to medical advance directive? No - Patient declined No - Patient declined - - - No - Patient declined No - Patient declined  Copy of [place] in Chart? No - copy requested No - copy requested - - - - -  Would patient like information on creating a medical advance directive? - No - Patient declined No - Patient declined No - Patient declined - - -   Pre-existing out of facility DNR order (yellow form or pink MOST form) - - - - - - -    Current Medications (verified) Outpatient Encounter Medications as of 02/01/2021  Medication Sig   amLODipine (NORVASC) 5 MG tablet Take 1 tablet (5 mg total) by mouth daily.   aspirin EC 81 MG tablet Take 81 mg by mouth daily.   baclofen (LIORESAL) 10 MG tablet TAKE 1 TABLET (10 MG TOTAL) BY MOUTH AT BEDTIME. FOR LEG CRAMPING   Beta Carotene (VITAMIN A) 25000 UNIT capsule Take 25,000 Units by mouth daily.   co-enzyme Q-10 30 MG capsule Take 30 mg by mouth 2 (two) times daily.   LUTEIN PO Take 1 tablet by mouth 2 (two) times daily.    metoprolol tartrate (LOPRESSOR) 25 MG tablet Take 1 tablet (25 mg total) by mouth 2 (two) times daily.   Multiple Vitamins-Minerals (ONE DAILY FOR MEN 50+ ADVANCED PO) Take 1 tablet by mouth daily.    omeprazole (PRILOSEC) 40 MG capsule Take 1 capsule (40 mg total) by mouth daily.   OVER THE COUNTER MEDICATION Take 1 tablet by mouth 2 (two) times daily. Vinegar tablet by mouth twice a day    saw palmetto 500 MG capsule Take 500 mg by mouth 2 (two) times daily.   Turmeric 500 MG CAPS Take 500 mg by mouth 2 (two) times daily.    vitamin B-12 (CYANOCOBALAMIN) 500 MCG tablet Take 1,000 mcg by mouth daily.   vitamin C (ASCORBIC ACID) 500 MG tablet Take 500 mg by mouth 2 (two) times daily.   ketoconazole (NIZORAL) 2 %  cream Apply 1 application topically daily. Apply to rash on neck. (Patient not taking: Reported on 02/01/2021)   naproxen (NAPROSYN) 500 MG tablet TAKE 1 TABLET (500 MG TOTAL) BY MOUTH 2 (TWO) TIMES DAILY WITH A MEAL (Patient not taking: Reported on 02/01/2021)   No facility-administered encounter medications on file as of 02/01/2021.    Allergies (verified) Patient has no known allergies.   History: Past Medical History:  Diagnosis Date   Arthritis    Aspiration pneumonia ([place]) 03/24/2014   Colostomy in place [facility])    Difficulty urinating    GERD  (gastroesophageal reflux disease)    Hypertension    Ileostomy present ([facility])    Rectal cancer, uT2uN0, s/p LAR/ileostomy 03/20/2014 colostomy Aug2015 12/25/2013   TCS MAY 2015    Shortening, leg, congenital    Left   Skin irritation    Surrounding ostomy site   Past Surgical History:  Procedure Laterality Date   COLON RESECTION N/A 03/24/2014   Procedure: Diagnostic  laparotomy, takedown of coloanal anastamosis, end colostomy, with wound vac;  Surgeon: Adin Hector, MD;  Location: [facility];  Service: General;  Laterality: N/A;   COLONOSCOPY N/A 12/22/2013   SLF: 1. Normal mucosa in the terminal ilium. 2. Moderate diverticulosis in the transverse colon, descending colon, and sigmoid colon. 3. Rectal bleeding due ot rectal mass and hemorrhoids 4. Moderate sized internal hemorrhoids.    COLONOSCOPY N/A 01/19/2015   Procedure: COLONOSCOPY;  Surgeon: Danie Binder, MD;  [facility]: [facility];  Service: Endoscopy;  Laterality: N/A;  1215   COLONOSCOPY N/A 03/22/2018   Procedure: COLONOSCOPY;  Surgeon: Danie Binder, MD;  [facility]: [facility];  Service: Endoscopy;  Laterality: N/A;  2:15pm   COLOSTOMY     ESOPHAGOGASTRODUODENOSCOPY N/A 12/22/2013   SLF: Probable Candia esophagitis2. Probable Barretts esphagus 3. Mild gastritis.    EUS N/A 01/01/2014   Procedure: LOWER ENDOSCOPIC ULTRASOUND (EUS);  Surgeon: Milus Banister, MD;  Location: [facility];  Service: Endoscopy;  Laterality: N/A;   EXAMINATION UNDER ANESTHESIA  08/25/2014   Procedure: EXAM UNDER ANESTHESIA;  Surgeon: Michael Boston, MD;  Location: [facility];  Service: General;;   eye reattachment and ear reattachment  Right [place] N/A 03/24/2014   Procedure: FLEXIBLE SIGMOIDOSCOPY;  Surgeon: Adin Hector, MD;  Location: [facility];  Service: General;  Laterality: N/A;   HIP FRACTURE SURGERY Left age 49 on 04-06-1970   ILEOSTOMY     ILEOSTOMY CLOSURE N/A 08/25/2014   Procedure: TAKEDOWN OF LOOP ILEOSTOMY ;  Surgeon: Michael Boston, MD;  Location: [facility];  Service: General;  Laterality: N/A;   INSERTION OF MESH N/A 04/11/2017   Procedure: INSERTION OF MESH ;  Surgeon: Michael Boston, MD;  Location: [facility];  Service: General;  Laterality: N/A;  BILATERAL TAP BLOCK   TOTAL HIP ARTHROPLASTY Left 11/19/2015   Procedure: LEFT TOTAL HIP ARTHROPLASTY ANTERIOR APPROACH;  Surgeon: Mcarthur Rossetti, MD;  Location: [facility];  Service: Orthopedics;  Laterality: Left;   VENTRAL HERNIA REPAIR N/A 04/11/2017   Procedure: LAPAROSCOPIC INCARCERATED PERISTOMAL AND INCARCERATED INCISIONAL HERNIA X2;  Surgeon: Michael Boston, MD;  Location: [facility];  Service: General;  Laterality: N/A;  BILATERAL TAP BLOCK   Family History  Problem Relation Age of Onset   Colon cancer Sister    Emphysema Mother    Heart attack Brother    Colon polyps Neg Hx    Social History   Socioeconomic History   Marital  status: Married    Spouse name: Not on file   Number of children: Not on file   Years of education: Not on file   Highest education level: Not on file  Occupational History   Not on file  Tobacco Use   Smoking status: Former    Packs/day: 0.00    Years: 15.00    Pack years: 0.00    Types: Cigars, Cigarettes    Quit date: 09/10/2011    Years since quitting: 9.4   Smokeless tobacco: Never  Vaping Use   Vaping Use: Never used  Substance and Sexual Activity   Alcohol use: No    Comment: quit years ago   Drug use: No   Sexual activity: Not on file  Other Topics Concern   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   Social Determinants of Health   Financial Resource Strain: Low Risk    Difficulty of Paying Living Expenses: Not very hard  Food Insecurity: No Food Insecurity   Worried About Charity fundraiser in the Last Year: Never true   Ran Out of Food in the Last Year: Never true  Transportation Needs: No Transportation Needs   Lack of Transportation (Medical): No   Lack of Transportation  (Non-Medical): No  Physical Activity: Sufficiently Active   Days of Exercise per Week: 5 days   Minutes of Exercise per Session: 50 min  Stress: No Stress Concern Present   Feeling of Stress : Not at all  Social Connections: Moderately Integrated   Frequency of Communication with Friends and Family: More than three times a week   Frequency of Social Gatherings with Friends and Family: More than three times a week   Attends Religious Services: More than 4 times per year   Active Member of [employer] or Organizations: No   Attends Music therapist: Never   Marital Status: Married    Tobacco Counseling Counseling given: Not Answered   Clinical Intake:  Pre-visit preparation completed: Yes  Pain : No/denies pain     Nutritional Risks: None Diabetes: No  How often do you need to have someone help you when you read instructions, pamphlets, or other written materials from your doctor or pharmacy?: 3 - Sometimes  Diabetic?No  Interpreter Needed?: No  Information entered by :: [place] of Daily Living In your present state of health, do you have any difficulty performing the following activities: 02/01/2021  Hearing? N  Vision? N  Difficulty concentrating or making decisions? N  Walking or climbing stairs? N  Dressing or bathing? N  Doing errands, shopping? N  Preparing Food and eating ? N  Using the Toilet? N  In the past six months, have you accidently leaked urine? Y  Comment has stress incontinence  Do you have problems with loss of bowel control? N  Managing your Medications? N  Managing your Finances? N  Housekeeping or managing your Housekeeping? N  Some recent data might be hidden    Patient Care Team: Erven Colla, DO as PCP - General (Family Medicine) Satira Sark, MD as Consulting Physician (Cardiology) Danie Binder, MD (Inactive) as Consulting Physician (Gastroenterology) Milus Banister, MD as Attending Physician  (Gastroenterology) Farrel Gobble, MD (Inactive) as Consulting Physician (Medical Oncology) Michael Boston, MD as Consulting Physician (General Surgery) Bjorn Loser, MD as Consulting Physician (Urology) Danie Binder, MD (Inactive) as Consulting Physician (Gastroenterology)  Indicate any recent Medical  Services you may have received from other than [facility] providers in the past year (date may be approximate).     Assessment:   This is a routine wellness examination for Mishawn.  Hearing/Vision screen Vision Screening - Comments:: Patient states gets eyes examined once per year.   Dietary issues and exercise activities discussed: Current Exercise Habits: Home exercise routine, Type of exercise: strength training/weights;walking, Time (Minutes): 45, Frequency (Times/Week): 7, Weekly Exercise (Minutes/Week): 315, Intensity: Moderate, Exercise limited by: None identified   Goals Addressed             This Visit's Progress    Patient Stated       I would like to maintain my current level of health and keep going to church and keeping my grandkids in church        Depression Screen PHQ 2/9 Scores 02/01/2021 09/16/2018 09/14/2017  PHQ - 2 Score 0 0 0    Fall Risk Fall Risk  02/01/2021 09/16/2018 09/14/2017  Falls in the past year? 0 0 No  Number falls in past yr: 0 - -  Injury with Fall? 0 - -  Risk for fall due to : No Fall Risks - -  Follow up Falls evaluation completed;Falls prevention discussed - -    FALL RISK PREVENTION PERTAINING TO THE HOME:  Any stairs in or around the home? Yes  If so, are there any without handrails? No  Home free of loose throw rugs in walkways, pet beds, electrical cords, etc? Yes  Adequate lighting in your home to reduce risk of falls? Yes   ASSISTIVE DEVICES UTILIZED TO PREVENT FALLS:  Life alert? No  Use of a cane, walker or w/c? No  Grab bars in the bathroom? Yes  Shower chair or bench in shower? No  Elevated toilet seat or a  handicapped toilet? No    Cognitive Function:  Normal cognitive status assessed by direct observation by this Nurse Health Advisor. No abnormalities found.        Immunizations Immunization History  Administered Date(s) Administered   Influenza,inj,Quad PF,6+ Mos 05/12/2014, 05/11/2015, 05/11/2016, 09/14/2017, 08/16/2018, 05/02/2019   Influenza-Unspecified 08/04/2020   Moderna Sars-Covid-2 Vaccination 12/17/2019, 08/28/2020   Pneumococcal Polysaccharide-23 05/12/2014   Zoster Recombinat (Shingrix) 08/16/2018, 08/20/2018    TDAP status: Due, Education has been provided regarding the importance of this vaccine. Advised may receive this vaccine at local pharmacy or Health Dept. Aware to provide a copy of the vaccination record if obtained from local pharmacy or Health Dept. Verbalized acceptance and understanding.  Flu Vaccine status: Up to date  Pneumococcal vaccine status: Up to date  Covid-19 vaccine status: Completed vaccines  Qualifies for Shingles Vaccine? Yes   Zostavax completed No   Shingrix Completed?: Yes  Screening Tests Health Maintenance  Topic Date Due   Pneumococcal Vaccine 73-9 Years old (1 - PCV) Never done   TETANUS/TDAP  Never done   Zoster Vaccines- Shingrix (2 of 2) 10/15/2018   COVID-19 Vaccine (3 - Moderna risk series) 09/25/2020   INFLUENZA VACCINE  03/21/2021   COLONOSCOPY (Pts 45-80yrs Insurance coverage will need to be confirmed)  03/22/2028   Hepatitis C Screening  Completed   HIV Screening  Completed   HPV VACCINES  Aged Out    Health Maintenance  Health Maintenance Due  Topic Date Due   Pneumococcal Vaccine 52-41 Years old (1 - PCV) Never done   TETANUS/TDAP  Never done   Zoster Vaccines- Shingrix (2 of 2) 10/15/2018   COVID-19 Vaccine (  3 - Moderna risk series) 09/25/2020    Colorectal cancer screening: Type of screening: Colonoscopy. Completed 03/22/2018. Repeat every 10 years  Lung Cancer Screening: (Low Dose CT Chest recommended  if Age 6-80 years, 30 pack-year currently smoking OR have quit w/in 15years.) does not qualify.   Lung Cancer Screening Referral: N/A   Additional Screening:  Hepatitis C Screening: does qualify; Completed 03/27/2014  Vision Screening: Recommended annual ophthalmology exams for early detection of glaucoma and other disorders of the eye. Is the patient up to date with their annual eye exam?  Yes  Who is the provider or what is the name of the office in which the patient attends annual eye exams? Eye office in [place]  If pt is not established with a provider, would they like to be referred to a provider to establish care? No .   Dental Screening: Recommended annual dental exams for proper oral hygiene  Community Resource Referral / Chronic Care Management: CRR required this visit?  No   CCM required this visit?  No      Plan:     I have personally reviewed and noted the following in the patient's chart:   Medical and social history Use of alcohol, tobacco or illicit drugs  Current medications and supplements including opioid prescriptions. Patient is not currently taking opioid prescriptions. Functional ability and status Nutritional status Physical activity Advanced directives List of other physicians Hospitalizations, surgeries, and ER visits in previous 12 months Vitals Screenings to include cognitive, depression, and falls Referrals and appointments  In addition, I have reviewed and discussed with patient certain preventive protocols, quality metrics, and best practice recommendations. A written personalized care plan for preventive services as well as general preventive health recommendations were provided to patient.     Ofilia Neas, LPN   9/82/[id number]   Nurse Notes: None

## 2021-02-01 ENCOUNTER — Ambulatory Visit (INDEPENDENT_AMBULATORY_CARE_PROVIDER_SITE_OTHER): Payer: Medicare HMO

## 2021-02-01 ENCOUNTER — Other Ambulatory Visit: Payer: Self-pay

## 2021-02-01 DIAGNOSIS — Z0000 Encounter for general adult medical examination without abnormal findings: Secondary | ICD-10-CM | POA: Diagnosis not present

## 2021-02-01 NOTE — Patient Instructions (Signed)
[name] , Thank you for taking time to come for your Medicare Wellness Visit. I appreciate your ongoing commitment to your health goals. Please review the following plan we discussed and let me know if I can assist you in the future.   Screening recommendations/referrals: Colonoscopy: Up to date, next due 03/22/2028 Recommended yearly ophthalmology/optometry visit for glaucoma screening and checkup Recommended yearly dental visit for hygiene and checkup  Vaccinations: Influenza vaccine: Up to date, next due fall 2022  Pneumococcal vaccine: Up to date, next due 09/15/2021 Tdap vaccine: Currently due, you may await and injury to receive  Shingles vaccine:  completed series     Advanced directives: Please bring in a copy of your advanced medical directives so that we may scan them into your chart  Conditions/risks identified: none   Next appointment: None   Preventive Care 64 Years and Older, Male Preventive care refers to lifestyle choices and visits with your health care provider that can promote health and wellness. What does preventive care include? A yearly physical exam. This is also called an annual well check. Dental exams once or twice a year. Routine eye exams. Ask your health care provider how often you should have your eyes checked. Personal lifestyle choices, including: Daily care of your teeth and gums. Regular physical activity. Eating a healthy diet. Avoiding tobacco and drug use. Limiting alcohol use. Practicing safe sex. Taking low doses of aspirin every day. Taking vitamin and mineral supplements as recommended by your health care provider. What happens during an annual well check? The services and screenings done by your health care provider during your annual well check will depend on your age, overall health, lifestyle risk factors, and family history of disease. Counseling  Your health care provider may ask you questions about your: Alcohol  use. Tobacco use. Drug use. Emotional well-being. Home and relationship well-being. Sexual activity. Eating habits. History of falls. Memory and ability to understand (cognition). Work and work Statistician. Screening  You may have the following tests or measurements: Height, weight, and BMI. Blood pressure. Lipid and cholesterol levels. These may be checked every 5 years, or more frequently if you are over 42 years old. Skin check. Lung cancer screening. You may have this screening every year starting at age 64 if you have a 30-pack-year history of smoking and currently smoke or have quit within the past 15 years. Fecal occult blood test (FOBT) of the stool. You may have this test every year starting at age 64. Flexible sigmoidoscopy or colonoscopy. You may have a sigmoidoscopy every 5 years or a colonoscopy every 10 years starting at age 64. Prostate cancer screening. Recommendations will vary depending on your family history and other risks. Hepatitis C blood test. Hepatitis B blood test. Sexually transmitted disease (STD) testing. Diabetes screening. This is done by checking your blood sugar (glucose) after you have not eaten for a while (fasting). You may have this done every 1-3 years. Abdominal aortic aneurysm (AAA) screening. You may need this if you are a current or former smoker. Osteoporosis. You may be screened starting at age 64 if you are at high risk. Talk with your health care provider about your test results, treatment options, and if necessary, the need for more tests. Vaccines  Your health care provider may recommend certain vaccines, such as: Influenza vaccine. This is recommended every year. Tetanus, diphtheria, and acellular pertussis (Tdap, Td) vaccine. You may need a Td booster every 10 years. Zoster vaccine. You may need this after  age 64. Pneumococcal 13-valent conjugate (PCV13) vaccine. One dose is recommended after age 64. Pneumococcal polysaccharide  (PPSV23) vaccine. One dose is recommended after age 64. Talk to your health care provider about which screenings and vaccines you need and how often you need them. This information is not intended to replace advice given to you by your health care provider. Make sure you discuss any questions you have with your health care provider. Document Released: 09/03/2015 Document Revised: 04/26/2016 Document Reviewed: 06/08/2015 Elsevier Interactive Patient Education  2017 [place] Prevention in the Home Falls can cause injuries. They can happen to people of all ages. There are many things you can do to make your home safe and to help prevent falls. What can I do on the outside of my home? Regularly fix the edges of walkways and driveways and fix any cracks. Remove anything that might make you trip as you walk through a door, such as a raised step or threshold. Trim any bushes or trees on the path to your home. Use bright outdoor lighting. Clear any walking paths of anything that might make someone trip, such as rocks or tools. Regularly check to see if handrails are loose or broken. Make sure that both sides of any steps have handrails. Any raised decks and porches should have guardrails on the edges. Have any leaves, snow, or ice cleared regularly. Use sand or salt on walking paths during winter. Clean up any spills in your garage right away. This includes oil or grease spills. What can I do in the bathroom? Use night lights. Install grab bars by the toilet and in the tub and shower. Do not use towel bars as grab bars. Use non-skid mats or decals in the tub or shower. If you need to sit down in the shower, use a plastic, non-slip stool. Keep the floor dry. Clean up any water that spills on the floor as soon as it happens. Remove soap buildup in the tub or shower regularly. Attach bath mats securely with double-sided non-slip rug tape. Do not have throw rugs and other things on the  floor that can make you trip. What can I do in the bedroom? Use night lights. Make sure that you have a light by your bed that is easy to reach. Do not use any sheets or blankets that are too big for your bed. They should not hang down onto the floor. Have a firm chair that has side arms. You can use this for support while you get dressed. Do not have throw rugs and other things on the floor that can make you trip. What can I do in the kitchen? Clean up any spills right away. Avoid walking on wet floors. Keep items that you use a lot in easy-to-reach places. If you need to reach something above you, use a strong step stool that has a grab bar. Keep electrical cords out of the way. Do not use floor polish or wax that makes floors slippery. If you must use wax, use non-skid floor wax. Do not have throw rugs and other things on the floor that can make you trip. What can I do with my stairs? Do not leave any items on the stairs. Make sure that there are handrails on both sides of the stairs and use them. Fix handrails that are broken or loose. Make sure that handrails are as [place] as the stairways. Check any carpeting to make sure that it is firmly attached to the stairs.  Fix any carpet that is loose or worn. Avoid having throw rugs at the top or bottom of the stairs. If you do have throw rugs, attach them to the floor with carpet tape. Make sure that you have a light switch at the top of the stairs and the bottom of the stairs. If you do not have them, ask someone to add them for you. What else can I do to help prevent falls? Wear shoes that: Do not have high heels. Have rubber bottoms. Are comfortable and fit you well. Are closed at the toe. Do not wear sandals. If you use a stepladder: Make sure that it is fully opened. Do not climb a closed stepladder. Make sure that both sides of the stepladder are locked into place. Ask someone to hold it for you, if possible. Clearly mark and make  sure that you can see: Any grab bars or handrails. First and last steps. Where the edge of each step is. Use tools that help you move around (mobility aids) if they are needed. These include: Canes. Walkers. Scooters. Crutches. Turn on the lights when you go into a dark area. Replace any light bulbs as soon as they burn out. Set up your furniture so you have a clear path. Avoid moving your furniture around. If any of your floors are uneven, fix them. If there are any pets around you, be aware of where they are. Review your medicines with your doctor. Some medicines can make you feel dizzy. This can increase your chance of falling. Ask your doctor what other things that you can do to help prevent falls. This information is not intended to replace advice given to you by your health care provider. Make sure you discuss any questions you have with your health care provider. Document Released: 06/03/2009 Document Revised: 01/13/2016 Document Reviewed: 09/11/2014 Elsevier Interactive Patient Education  2017 [employer].

## 2021-02-03 DIAGNOSIS — Z933 Colostomy status: Secondary | ICD-10-CM | POA: Diagnosis not present

## 2021-02-03 DIAGNOSIS — Z85038 Personal history of other malignant neoplasm of large intestine: Secondary | ICD-10-CM | POA: Diagnosis not present

## 2021-02-04 ENCOUNTER — Other Ambulatory Visit: Payer: Self-pay | Admitting: Family Medicine

## 2021-02-13 ENCOUNTER — Other Ambulatory Visit: Payer: Self-pay | Admitting: Family Medicine

## 2021-03-25 DIAGNOSIS — Z85038 Personal history of other malignant neoplasm of large intestine: Secondary | ICD-10-CM | POA: Diagnosis not present

## 2021-03-25 DIAGNOSIS — Z933 Colostomy status: Secondary | ICD-10-CM | POA: Diagnosis not present

## 2021-04-01 ENCOUNTER — Encounter (HOSPITAL_COMMUNITY): Payer: Self-pay | Admitting: *Deleted

## 2021-04-01 ENCOUNTER — Emergency Department (HOSPITAL_COMMUNITY)
Admission: EM | Admit: 2021-04-01 | Discharge: 2021-04-01 | Disposition: A | Discharge status: Home / Self Care | Payer: Medicare HMO | Attending: Emergency Medicine | Admitting: Emergency Medicine

## 2021-04-01 ENCOUNTER — Emergency Department (HOSPITAL_COMMUNITY): Payer: Medicare HMO

## 2021-04-01 ENCOUNTER — Other Ambulatory Visit: Payer: Self-pay

## 2021-04-01 DIAGNOSIS — Z7982 Long term (current) use of aspirin: Secondary | ICD-10-CM | POA: Insufficient documentation

## 2021-04-01 DIAGNOSIS — S199XXA Unspecified injury of neck, initial encounter: Secondary | ICD-10-CM | POA: Diagnosis not present

## 2021-04-01 DIAGNOSIS — M5030 Other cervical disc degeneration, unspecified cervical region: Secondary | ICD-10-CM | POA: Diagnosis not present

## 2021-04-01 DIAGNOSIS — Z85048 Personal history of other malignant neoplasm of rectum, rectosigmoid junction, and anus: Secondary | ICD-10-CM | POA: Diagnosis not present

## 2021-04-01 DIAGNOSIS — M47892 Other spondylosis, cervical region: Secondary | ICD-10-CM | POA: Insufficient documentation

## 2021-04-01 DIAGNOSIS — S4992XA Unspecified injury of left shoulder and upper arm, initial encounter: Secondary | ICD-10-CM | POA: Insufficient documentation

## 2021-04-01 DIAGNOSIS — S4990XA Unspecified injury of shoulder and upper arm, unspecified arm, initial encounter: Secondary | ICD-10-CM | POA: Diagnosis not present

## 2021-04-01 DIAGNOSIS — Z87891 Personal history of nicotine dependence: Secondary | ICD-10-CM | POA: Diagnosis not present

## 2021-04-01 DIAGNOSIS — W1830XA Fall on same level, unspecified, initial encounter: Secondary | ICD-10-CM | POA: Diagnosis not present

## 2021-04-01 DIAGNOSIS — Z79899 Other long term (current) drug therapy: Secondary | ICD-10-CM | POA: Diagnosis not present

## 2021-04-01 DIAGNOSIS — I10 Essential (primary) hypertension: Secondary | ICD-10-CM | POA: Diagnosis not present

## 2021-04-01 DIAGNOSIS — M47812 Spondylosis without myelopathy or radiculopathy, cervical region: Secondary | ICD-10-CM

## 2021-04-01 DIAGNOSIS — R200 Anesthesia of skin: Secondary | ICD-10-CM | POA: Diagnosis not present

## 2021-04-01 DIAGNOSIS — M4602 Spinal enthesopathy, cervical region: Secondary | ICD-10-CM | POA: Diagnosis not present

## 2021-04-01 DIAGNOSIS — M19012 Primary osteoarthritis, left shoulder: Secondary | ICD-10-CM | POA: Diagnosis not present

## 2021-04-01 MED ORDER — MELOXICAM 7.5 MG PO TABS: 7.5000 mg | ORAL_TABLET | Freq: Every day | ORAL | 0 refills | Status: DC

## 2021-04-01 NOTE — Discharge Instructions (Addendum)
The CT of your shoulder and neck shows that you have some significant arthritis of your cervical spine.  This could be causing some of your symptoms.  You may call Dr. Ruthe Mannan office to arrange a follow-up appointment.  You have also been given referral information for local primary care providers to establish primary care if needed.    [facility] Primary Care Doctor List    Sinda Du MD. Specialty: Pulmonary Disease Contact information: [place]  [place] [address]  [phone]   Tula Nakayama, MD. Specialty: [facility] Contact information: [address], [address] [place] [address]  [phone]   Sallee Lange, MD. Specialty: [facility] Contact information: [place]  [place] [address]  [phone]   Rosita Fire, MD Specialty: Internal Medicine Contact information: [place] [address]  [phone]   Delphina Cahill, MD. Specialty: Internal Medicine Contact information: [place] [address]  [phone]    [facility] (Dr. Maudie Mercury) Specialty: Family Medicine Contact information: [place] [address]  [phone]   Leslie Andrea, MD. Specialty: [facility] Contact information: [place] [address]  [phone]   Asencion Noble, MD. Specialty: Internal Medicine Contact information: [address]  [place] [address]  [place]  [address] [place], [place] [address] [phone]  Services [facility] offers a variety of basic health services.  Services include but are not limited to: Blood pressure checks  Heart rate checks  Blood sugar checks  Urine analysis  Rapid strep tests  Pregnancy tests.  Health education and referrals  People needing more complex services will be  directed to a physician online. Using these virtual visits, doctors can evaluate and prescribe medicine and treatments. There will be no medication on-site, though [facility] will help patients fill their prescriptions at little to no cost.   For More information please go to: [email or web address]

## 2021-04-01 NOTE — ED Provider Notes (Signed)
[facility] EMERGENCY DEPARTMENT Provider Note   CSN: [id number] Arrival date & time: 04/01/21  1229     History Chief Complaint  Patient presents with   [name] is a 65 y.o. male.   Fall Pertinent negatives include no chest pain, no abdominal pain, no headaches and no shortness of breath.      [name] is a 65 y.o. male past medical history of arthritis, GERD, hypertension, rectal cancer diagnosed in 2015 has colostomy in place who presents to the Emergency Department complaining of left neck and shoulder pain secondary to a fall/possible syncope episode that occurred 3 weeks ago.  He states that he became "heated" while mowing his lawn.  He stopped mowing to go inside the house and when he stepped off the mower he is unsure if he fell or fainted.  He recalls the fall was able to get up without assistance and walk inside the house.  States he laid down on the sofa and woke up with pain along his left lateral neck and into his left shoulder.  He notes pain associated with movement of his arm.  He has some tingling and throbbing sensation along his shoulder and scapula.  Unable to perform full range of motion of the shoulder due to pain.  States his symptoms have been gradually getting worse which prompted his ER visit today.  He denies headaches, dizziness, chest pain, numbness or tingling that radiates into his arms, nausea, vomiting or visual changes.  Past Medical History:  Diagnosis Date   Arthritis    Aspiration pneumonia ([place]) 03/24/2014   Colostomy in place [facility])    Difficulty urinating    GERD (gastroesophageal reflux disease)    Hypertension    Ileostomy present ([place])    Rectal cancer, uT2uN0, s/p LAR/ileostomy 03/20/2014 colostomy Aug2015 12/25/2013   TCS MAY 2015    Shortening, leg, congenital    Left   Skin irritation    Surrounding ostomy site    Patient Active Problem List   Diagnosis Date Noted   DOE (dyspnea on exertion) 02/20/2018    Incarcerated incisional hernia s/p lap repair with mesh 04/11/2017 04/11/2017   Incarcerated parastomal hernia s/p lap repair with mesh 04/11/2017 01/17/2017   Presence of left artificial hip joint 11/09/2016   Osteoarthritis of left hip 11/19/2015   Avascular necrosis of bone of left hip ([place]) 10/06/2015   Acquired inequality of length of lower legs 10/06/2015   Colostomy in place [facility])    Essential hypertension, benign 05/12/2014   Thrombocytosis 04/14/2014   Eustachian tube dysfunction 04/07/2014   Obesity (BMI 30-39.9) 03/24/2014   Rectal cancer, uT2uN0, s/p LAR/ileostomy 03/20/2014 colostomy Aug2015 12/25/2013   GERD (gastroesophageal reflux disease) 11/27/2013   Atypical chest pain 11/10/2013   Tobacco abuse, in remission 11/10/2013    Past Surgical History:  Procedure Laterality Date   COLON RESECTION N/A 03/24/2014   Procedure: Diagnostic  laparotomy, takedown of coloanal anastamosis, end colostomy, with wound vac;  Surgeon: Adin Hector, MD;  Location: [facility] ORS;  Service: General;  Laterality: N/A;   COLONOSCOPY N/A 12/22/2013   SLF: 1. Normal mucosa in the terminal ilium. 2. Moderate diverticulosis in the transverse colon, descending colon, and sigmoid colon. 3. Rectal bleeding due ot rectal mass and hemorrhoids 4. Moderate sized internal hemorrhoids.    COLONOSCOPY N/A 01/19/2015   Procedure: COLONOSCOPY;  Surgeon: Danie Binder, MD;  Location: [facility] ENDO SUITE;  Service: Endoscopy;  Laterality: N/A;  1215   COLONOSCOPY N/A 03/22/2018   Procedure: COLONOSCOPY;  Surgeon: Danie Binder, MD;  Location: [facility];  Service: Endoscopy;  Laterality: N/A;  2:15pm   COLOSTOMY     ESOPHAGOGASTRODUODENOSCOPY N/A 12/22/2013   SLF: Probable Candia esophagitis2. Probable Barretts esphagus 3. Mild gastritis.    EUS N/A 01/01/2014   Procedure: LOWER ENDOSCOPIC ULTRASOUND (EUS);  Surgeon: Milus Banister, MD;  Location: [facility];  Service: Endoscopy;  Laterality: N/A;   EXAMINATION UNDER  ANESTHESIA  08/25/2014   Procedure: EXAM UNDER ANESTHESIA;  Surgeon: Michael Boston, MD;  Location: [facility];  Service: General;;   eye reattachment and ear reattachment  Right [place] N/A 03/24/2014   Procedure: FLEXIBLE SIGMOIDOSCOPY;  Surgeon: Adin Hector, MD;  Location: [facility];  Service: General;  Laterality: N/A;   HIP FRACTURE SURGERY Left age 87 on 04-06-1970   ILEOSTOMY     ILEOSTOMY CLOSURE N/A 08/25/2014   Procedure: TAKEDOWN OF LOOP ILEOSTOMY ;  Surgeon: Michael Boston, MD;  Location: [facility];  Service: General;  Laterality: N/A;   INSERTION OF MESH N/A 04/11/2017   Procedure: INSERTION OF MESH ;  Surgeon: Michael Boston, MD;  Location: [facility];  Service: General;  Laterality: N/A;  BILATERAL TAP BLOCK   TOTAL HIP ARTHROPLASTY Left 11/19/2015   Procedure: LEFT TOTAL HIP ARTHROPLASTY ANTERIOR APPROACH;  Surgeon: Mcarthur Rossetti, MD;  Location: [facility];  Service: Orthopedics;  Laterality: Left;   VENTRAL HERNIA REPAIR N/A 04/11/2017   Procedure: LAPAROSCOPIC INCARCERATED PERISTOMAL AND INCARCERATED INCISIONAL HERNIA X2;  Surgeon: Michael Boston, MD;  Location: [facility];  Service: General;  Laterality: N/A;  BILATERAL TAP BLOCK       Family History  Problem Relation Age of Onset   Colon cancer Sister    Emphysema Mother    Heart attack Brother    Colon polyps Neg Hx     Social History   Tobacco Use   Smoking status: Former    Packs/day: 0.00    Years: 15.00    Pack years: 0.00    Types: Cigars, Cigarettes    Quit date: 09/10/2011    Years since quitting: 9.5   Smokeless tobacco: Never  Vaping Use   Vaping Use: Never used  Substance Use Topics   Alcohol use: No    Comment: quit years ago   Drug use: No    Home Medications Prior to Admission medications   Medication Sig Start Date End Date Taking? Authorizing Provider  amLODipine (NORVASC) 5 MG tablet TAKE 1 TABLET (5 MG TOTAL) BY MOUTH DAILY. 02/14/21   Erven Colla, DO  aspirin EC 81 MG tablet  Take 81 mg by mouth daily.    [provider]  baclofen (LIORESAL) 10 MG tablet TAKE 1 TABLET (10 MG TOTAL) BY MOUTH AT BEDTIME. FOR LEG CRAMPING 12/13/20   Elvia Collum M, DO  Beta Carotene (VITAMIN A) 25000 UNIT capsule Take 25,000 Units by mouth daily.    [provider]  co-enzyme Q-10 30 MG capsule Take 30 mg by mouth 2 (two) times daily.    [provider]  ketoconazole (NIZORAL) 2 % cream Apply 1 application topically daily. Apply to rash on neck. Patient not taking: Reported on 02/01/2021 04/14/20   Elvia Collum M, DO  LUTEIN PO Take 1 tablet by mouth 2 (two) times daily.     [provider]  metoprolol tartrate (LOPRESSOR) 25 MG tablet Take 1 tablet (25 mg total)  by mouth 2 (two) times daily. 11/19/20   Elvia Collum M, DO  Multiple Vitamins-Minerals (ONE DAILY FOR MEN 50+ ADVANCED PO) Take 1 tablet by mouth daily.     [provider]  naproxen (NAPROSYN) 500 MG tablet TAKE 1 TABLET (500 MG TOTAL) BY MOUTH 2 (TWO) TIMES DAILY WITH A MEAL Patient not taking: Reported on 02/01/2021 08/18/20   Kathyrn Drown, MD  omeprazole (PRILOSEC) 40 MG capsule TAKE 1 CAPSULE (40 MG TOTAL) BY MOUTH DAILY. 02/04/21   Kathyrn Drown, MD  OVER THE COUNTER MEDICATION Take 1 tablet by mouth 2 (two) times daily. Vinegar tablet by mouth twice a day     [provider]  saw palmetto 500 MG capsule Take 500 mg by mouth 2 (two) times daily.    [provider]  Turmeric 500 MG CAPS Take 500 mg by mouth 2 (two) times daily.     [provider]  vitamin B-12 (CYANOCOBALAMIN) 500 MCG tablet Take 1,000 mcg by mouth daily.    [provider]  vitamin C (ASCORBIC ACID) 500 MG tablet Take 500 mg by mouth 2 (two) times daily.    [provider]    Allergies    Patient has no known allergies.  Review of Systems   Review of Systems  Constitutional:  Negative for chills and fever.  Eyes:  Negative for visual disturbance.   Respiratory:  Negative for shortness of breath.   Cardiovascular:  Negative for chest pain.  Gastrointestinal:  Negative for abdominal pain, nausea and vomiting.  Genitourinary:  Negative for difficulty urinating and dysuria.  Musculoskeletal:  Positive for arthralgias (Left shoulder pain) and neck pain. Negative for joint swelling.  Skin:  Negative for color change and wound.  Neurological:  Positive for syncope. Negative for dizziness, seizures, facial asymmetry, weakness and headaches.       Tingling sensation to the left upper back and shoulder  Psychiatric/Behavioral:  Negative for confusion.    Physical Exam Updated Vital Signs BP (!) 162/99 (BP Location: Right Arm)   Pulse 71   Temp 98.3 F (36.8 C) (Oral)   Resp 18   Ht '5\' 5"'$  (1.651 m)   Wt 87.1 kg   SpO2 95%   BMI 31.95 kg/m   Physical Exam Vitals and nursing note reviewed.  Constitutional:      General: He is not in acute distress.    Appearance: Normal appearance. He is not toxic-appearing.  HENT:     Head: Atraumatic.  Eyes:     Extraocular Movements: Extraocular movements intact.     Conjunctiva/sclera: Conjunctivae normal.     Pupils: Pupils are equal, round, and reactive to light.  Neck:     Comments: Tenderness palpation of the left cervical paraspinal muscles.  Some midline tenderness of the lower cervical spine.  No bony step-offs. Cardiovascular:     Rate and Rhythm: Normal rate and regular rhythm.     Pulses: Normal pulses.  Pulmonary:     Effort: Pulmonary effort is normal.  Chest:     Chest wall: No tenderness.  Abdominal:     Palpations: Abdomen is soft.     Tenderness: There is no abdominal tenderness.     Comments: Colostomy bag in place with small amount of stool present  Musculoskeletal:        General: Tenderness and signs of injury present. No swelling.     Right shoulder: Normal pulse.     Left shoulder: Bony tenderness present.  No swelling or deformity. Decreased range of motion.  Decreased strength. Normal pulse.     Cervical back: Tenderness present.     Comments: Tenderness to palpation over the left AC joint.  No bony step-offs or crepitus.  Pain with abduction of the left shoulder.  Grip strength symmetrical.  Patient has some tenderness along the left scapular border and left trapezius muscles.  Skin:    General: Skin is warm.     Capillary Refill: Capillary refill takes less than 2 seconds.     Findings: No erythema or rash.  Neurological:     General: No focal deficit present.     Mental Status: He is alert.     Sensory: Sensation is intact. No sensory deficit.     Motor: Motor function is intact. No weakness.     Comments: Cranial nerves II through XII intact.  Speech clear.  No facial droop or dysarthria.  No pronator drift.  Normal finger-nose testing.    ED Results / Procedures / Treatments   Labs (all labs ordered are listed, but only abnormal results are displayed) Labs Reviewed - No data to display  EKG EKG Interpretation  Date/Time:  Friday April 01 2021 12:45:27 EDT Ventricular Rate:  68 PR Interval:  224 QRS Duration: 76 QT Interval:  378 QTC Calculation: 401 R Axis:   4 Text Interpretation: Sinus rhythm with 1st degree A-V block Otherwise normal ECG Confirmed by Noemi Chapel [phone]) on 04/01/2021 5:22:26 PM  Radiology CT Cervical Spine Wo Contrast  Result Date: 04/01/2021 CLINICAL DATA:  Neck trauma, focal neuro deficit or paresthesia (Age 67-64y) Patient reports limited range of motion to left shoulder. Fall off lawnmower 3 weeks ago. Left arm numbness since falling off lawnmower. EXAM: CT CERVICAL SPINE WITHOUT CONTRAST TECHNIQUE: Multidetector CT imaging of the cervical spine was performed without intravenous contrast. Multiplanar CT image reconstructions were also generated. COMPARISON:  Remote cervical spine CT 08/04/2005 FINDINGS: Alignment: Straightening of normal lordosis. No traumatic subluxation. Skull base and vertebrae: No  acute fracture. Vertebral body heights are maintained. The dens and skull base are intact. Soft tissues and spinal canal: No prevertebral fluid or swelling. No visible canal hematoma. Disc levels: Disc space narrowing with anterior and posterior endplate spurring at D34-534, C6-C7, and C7-T1. Possible right paracentral disc protrusion at C5-C6. There is multilevel facet hypertrophy. There is bilateral neural foraminal stenosis at multiple levels. No high-grade bony canal stenosis. Upper chest: No acute or unexpected findings. Other: None. IMPRESSION: 1. Multilevel degenerative disc disease and facet hypertrophy throughout the cervical spine. No acute fracture or subluxation. 2. Possible right paracentral disc protrusion at C5-C6. Multifactorial bony neural foraminal stenosis. This could be further assessed with MRI. Electronically Signed   By: Keith Rake M.D.   On: 04/01/2021 16:55   CT Shoulder Left Wo Contrast  Result Date: 04/01/2021 CLINICAL DATA:  Patient fell 3 weeks ago and has left shoulder pain, limited range of motion and left arm numbness. EXAM: CT OF THE UPPER LEFT EXTREMITY WITHOUT CONTRAST TECHNIQUE: Multidetector CT imaging of the left shoulder was performed according to the standard protocol. COMPARISON:  None recent.  Left upper arm radiographs 03/28/2014. FINDINGS: Bones/Joint/Cartilage No evidence of acute fracture or dislocation. There are moderate glenohumeral and mild acromioclavicular degenerative changes. No significant shoulder joint effusion. There are possible small loose bodies in the superior aspect of the bicipital groove. Ligaments Suboptimally assessed by CT. Muscles and Tendons No focal muscular atrophy or significant rotator cuff impingement identified. Soft  tissues No evidence of periarticular hematoma or foreign body. Mild emphysematous changes are present in the left lung. IMPRESSION: 1. No acute osseous findings. 2. Moderate glenohumeral degenerative changes with  possible small loose bodies superiorly in the bicipital groove. Mild acromioclavicular degenerative changes. 3. No significant soft tissue findings. Electronically Signed   By: Richardean Sale M.D.   On: 04/01/2021 16:50    Procedures Procedures   Medications Ordered in ED Medications - No data to display  ED Course  I have reviewed the triage vital signs and the nursing notes.  Pertinent labs & imaging results that were available during my care of the patient were reviewed by me and considered in my medical decision making (see chart for details).    MDM Rules/Calculators/A&P                           Patient here for evaluation of left neck and shoulder pain.  Fall possible syncope 3 weeks ago.  Patient reports being overheated at time of injury.  On my exam, patient has pain of the left cervical paraspinal muscles and pain of the left shoulder joint.  Pain is reproduced through range of motion.  No focal weakness.  Doubt CVA or ACS.  No skin changes or rash to suggest zoster.  Symptoms are felt to be musculoskeletal.  Will obtain CT imaging of the C-spine and shoulder.  CT imaging without acute findings.  EKG without acute ischemic change.  Discussed findings with patient.  He may benefit from NSAID therapy.  Reassuring serum creatinine 4 months ago.  Patient will be given orthopedic follow-up information and he is requesting resources for establishing a new PCP.  Patient also seen by Dr. Sabra Heck and care plan discussed.    Final Clinical Impression(s) / ED Diagnoses Final diagnoses:  Injury of left shoulder, initial encounter  Cervical spine arthritis    Rx / DC Orders ED Discharge Orders     None        Kem Parkinson, PA-C 04/01/21 1750    Noemi Chapel, MD 04/02/21 1152

## 2021-04-01 NOTE — ED Triage Notes (Signed)
Pt fell off the lawnmower 3 weeks ago, pt got up and went in the house.  Pt not able to state if he had syncope or not.  Pt with left arm numbness since falling off the lawnmower.  Pt states at time it occurred it was during the heat with elevated temperatures.  Pt lives at home with wife, states wife was not notified at the time.

## 2021-04-01 NOTE — ED Triage Notes (Signed)
Limited ROM to left shoulder

## 2021-04-01 NOTE — ED Provider Notes (Signed)
This patient is a well-appearing 65 year old male presenting with approximately 3 weeks of left shoulder and neck pain after being in a minor accident, he has no neurologic symptoms of the upper extremity, his pain is reproducible with movement and palpation, the CT scans of the neck and the shoulder show that he has some arthritis but no acute fractures or other concerning findings.  He has been informed of these results will be placed on an NSAID and we will discharged home, the patient is agreeable.   Noemi Chapel, MD 04/01/21 [phone]

## 2021-04-04 ENCOUNTER — Ambulatory Visit (HOSPITAL_COMMUNITY)
Admission: RE | Admit: 2021-04-04 | Discharge: 2021-04-04 | Disposition: A | Discharge status: Home / Self Care | Payer: Medicare HMO | Source: Ambulatory Visit | Attending: Family Medicine | Admitting: Family Medicine

## 2021-04-04 ENCOUNTER — Ambulatory Visit (INDEPENDENT_AMBULATORY_CARE_PROVIDER_SITE_OTHER): Payer: Medicare HMO | Admitting: Family Medicine

## 2021-04-04 ENCOUNTER — Encounter: Payer: Self-pay | Admitting: Family Medicine

## 2021-04-04 ENCOUNTER — Other Ambulatory Visit: Payer: Self-pay

## 2021-04-04 VITALS — BP 135/89 | HR 75 | Temp 97.9°F | Ht 65.0 in | Wt 198.0 lb

## 2021-04-04 DIAGNOSIS — I10 Essential (primary) hypertension: Secondary | ICD-10-CM

## 2021-04-04 DIAGNOSIS — W19XXXD Unspecified fall, subsequent encounter: Secondary | ICD-10-CM

## 2021-04-04 DIAGNOSIS — M79671 Pain in right foot: Secondary | ICD-10-CM

## 2021-04-04 DIAGNOSIS — M7542 Impingement syndrome of left shoulder: Secondary | ICD-10-CM

## 2021-04-04 DIAGNOSIS — M79672 Pain in left foot: Secondary | ICD-10-CM | POA: Diagnosis not present

## 2021-04-04 DIAGNOSIS — M542 Cervicalgia: Secondary | ICD-10-CM | POA: Diagnosis not present

## 2021-04-04 DIAGNOSIS — M19072 Primary osteoarthritis, left ankle and foot: Secondary | ICD-10-CM | POA: Diagnosis not present

## 2021-04-04 DIAGNOSIS — M19071 Primary osteoarthritis, right ankle and foot: Secondary | ICD-10-CM | POA: Diagnosis not present

## 2021-04-04 MED ORDER — BACLOFEN 10 MG PO TABS
10.0000 mg | ORAL_TABLET | Freq: Every day | ORAL | 3 refills | Status: DC
Start: 2021-04-04 — End: 2021-10-20

## 2021-04-04 MED ORDER — HYDROCODONE-ACETAMINOPHEN 5-325 MG PO TABS: 1.0000 | ORAL_TABLET | Freq: Four times a day (QID) | ORAL | 0 refills | Status: DC | PRN

## 2021-04-04 MED ORDER — AMLODIPINE BESYLATE 5 MG PO TABS: 5.0000 mg | ORAL_TABLET | Freq: Every day | ORAL | 0 refills | Status: DC

## 2021-04-04 MED ORDER — MELOXICAM 7.5 MG PO TABS: 7.5000 mg | ORAL_TABLET | Freq: Every day | ORAL | 0 refills | Status: DC

## 2021-04-04 MED ORDER — METOPROLOL TARTRATE 25 MG PO TABS: 25.0000 mg | ORAL_TABLET | Freq: Two times a day (BID) | ORAL | 1 refills | Status: DC

## 2021-04-04 NOTE — Progress Notes (Signed)
Patient ID: [name], male    DOB: 12/03/1955, 65 y.o.   MRN: [id number]   Chief Complaint  Patient presents with   er follow up     Arthritis Fall 1 month ago while out in heat    Subjective:    HPI Pt went to the ER on 04/01/21- for concern of over-heating, fall, and left shoulder/neck pain.  The fall and the episode happened about 3 wks previous to the ER visit.  Pt was having bilateral upper arm pains at that time, now having left shoulder pain. 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  Very hot outside 100 deg heat index.  When stepped down, went down near syncope and grabbed to steps and able to get up and went into house, stumbled in. And went on couch.  Work up from his rest, felt better.  Didn't go back outside.  Flet down left shoulder was hit. Severe arthritis in the neck and  left shoulder. Sore on the bottom of the foot.  2-3rd to on rt toes having pain on plantar surface.  Waith walking, numbness at times. Both feet, have but worse on rt.  Has h/o colostomy bag.  Had fallen on left shoulder long time ago when in 2018 from the bed.  Has had pain in left shoulder for years. huruting in front of left shoulder. Left handed.  Pt noticing pain when going off the prilosec.  Only 2 days w/o the medication. Has had this since he was a child.   Pt had bilateral foot pain, and went to podiatry, but only had them cut off a callous, they didn't state he had any concerns on pad of the foot where he is having the pain.  Medical History Don has a past medical history of Arthritis, Aspiration pneumonia ([place]) (03/24/2014), Colostomy in place [facility]), Difficulty urinating, GERD (gastroesophageal reflux disease), Hypertension, Ileostomy present ([place]), Rectal cancer, uT2uN0, s/p LAR/ileostomy 03/20/2014 colostomy [id number] (12/25/2013), Shortening, leg, congenital, and Skin irritation.   Outpatient Encounter Medications as of 04/04/2021  Medication Sig   aspirin EC 81 MG  tablet Take 81 mg by mouth daily.   Beta Carotene (VITAMIN A) 25000 UNIT capsule Take 25,000 Units by mouth daily.   co-enzyme Q-10 30 MG capsule Take 30 mg by mouth daily.   HYDROcodone-acetaminophen (NORCO) 5-325 MG tablet Take 1 tablet by mouth every 6 (six) hours as needed for moderate pain.   ketoconazole (NIZORAL) 2 % cream Apply 1 application topically daily. Apply to rash on neck.   LUTEIN PO Take 1 tablet by mouth 2 (two) times daily.    Multiple Vitamins-Minerals (ONE DAILY FOR MEN 50+ ADVANCED PO) Take 1 tablet by mouth daily.    omeprazole (PRILOSEC) 40 MG capsule TAKE 1 CAPSULE (40 MG TOTAL) BY MOUTH DAILY.   OVER THE COUNTER MEDICATION Take 1 tablet by mouth 2 (two) times daily. Vinegar tablet by mouth twice a day    saw palmetto 500 MG capsule Take 500 mg by mouth 2 (two) times daily.   Turmeric 500 MG CAPS Take 500 mg by mouth 2 (two) times daily.    vitamin B-12 (CYANOCOBALAMIN) 500 MCG tablet Take 1,000 mcg by mouth daily.   vitamin C (ASCORBIC ACID) 500 MG tablet Take 500 mg by mouth 2 (two) times daily.   [DISCONTINUED] amLODipine (NORVASC) 5 MG tablet TAKE 1 TABLET (5 MG TOTAL) BY MOUTH DAILY.   [DISCONTINUED] baclofen (LIORESAL) 10 MG tablet TAKE 1 TABLET (10  MG TOTAL) BY MOUTH AT BEDTIME. FOR LEG CRAMPING   [DISCONTINUED] meloxicam (MOBIC) 7.5 MG tablet Take 1 tablet (7.5 mg total) by mouth daily.   [DISCONTINUED] metoprolol tartrate (LOPRESSOR) 25 MG tablet Take 1 tablet (25 mg total) by mouth 2 (two) times daily.   [DISCONTINUED] metoprolol tartrate (LOPRESSOR) 25 MG tablet 1 tablet with food   amLODipine (NORVASC) 5 MG tablet Take 1 tablet (5 mg total) by mouth daily.   baclofen (LIORESAL) 10 MG tablet Take 1 tablet (10 mg total) by mouth at bedtime. For leg cramping   meloxicam (MOBIC) 7.5 MG tablet Take 1 tablet (7.5 mg total) by mouth daily.   metoprolol tartrate (LOPRESSOR) 25 MG tablet Take 1 tablet (25 mg total) by mouth 2 (two) times daily.   No  facility-administered encounter medications on file as of 04/04/2021.     Review of Systems  Constitutional:  Negative for chills and fever.  HENT:  Negative for congestion, rhinorrhea and sore throat.   Respiratory:  Negative for cough, shortness of breath and wheezing.   Cardiovascular:  Negative for chest pain and leg swelling.  Gastrointestinal:  Negative for abdominal pain, diarrhea, nausea and vomiting.  Genitourinary:  Negative for dysuria and frequency.  Musculoskeletal:  Positive for arthralgias (left shoulder pain).  Skin:  Negative for rash.  Neurological:  Negative for dizziness, weakness and headaches.    Vitals BP 135/89   Pulse 75   Temp 97.9 F (36.6 C)   Ht '5\' 5"'$  (1.651 m)   Wt 198 lb (89.8 kg)   SpO2 97%   BMI 32.95 kg/m   Objective:   Physical Exam Vitals and nursing note reviewed.  Constitutional:      General: He is not in acute distress.    Appearance: Normal appearance. He is not ill-appearing.  HENT:     Head: Normocephalic.     Nose: Nose normal. No congestion.     Mouth/Throat:     Mouth: Mucous membranes are moist.     Pharynx: No oropharyngeal exudate.  Eyes:     Extraocular Movements: Extraocular movements intact.     Conjunctiva/sclera: Conjunctivae normal.     Pupils: Pupils are equal, round, and reactive to light.  Neck:     Comments: +ttp over left cervical paraspinal area. Cardiovascular:     Rate and Rhythm: Normal rate and regular rhythm.     Pulses: Normal pulses.     Heart sounds: Normal heart sounds. No murmur heard. Pulmonary:     Effort: Pulmonary effort is normal.     Breath sounds: Normal breath sounds. No wheezing, rhonchi or rales.  Musculoskeletal:        General: Tenderness (left ant shoulder) present.     Cervical back: Normal range of motion.     Right lower leg: No edema.     Left lower leg: No edema.     Comments: +dec rom with left shoulder with abduction.  +ttp over left anterior shoulder. Some crepitus.     Skin:    General: Skin is warm and dry.     Findings: No rash.  Neurological:     General: No focal deficit present.     Mental Status: He is alert and oriented to person, place, and time.     Cranial Nerves: No cranial nerve deficit.     Sensory: No sensory deficit.     Motor: No weakness.     Gait: Gait normal.  Psychiatric:  Mood and Affect: Mood normal.        Behavior: Behavior normal.        Thought Content: Thought content normal.        Judgment: Judgment normal.     Assessment and Plan   1. Fall, subsequent encounter  2. Impingement syndrome of left shoulder - HYDROcodone-acetaminophen (NORCO) 5-325 MG tablet; Take 1 tablet by mouth every 6 (six) hours as needed for moderate pain.  Dispense: 21 tablet; Refill: 0  3. Bilateral foot pain - DG Foot Complete Right; Future - DG Foot Complete Left; Future - HYDROcodone-acetaminophen (NORCO) 5-325 MG tablet; Take 1 tablet by mouth every 6 (six) hours as needed for moderate pain.  Dispense: 21 tablet; Refill: 0 - meloxicam (MOBIC) 7.5 MG tablet; Take 1 tablet (7.5 mg total) by mouth daily.  Dispense: 30 tablet; Refill: 0  4. Neck pain on left side  5. Essential hypertension, benign - amLODipine (NORVASC) 5 MG tablet; Take 1 tablet (5 mg total) by mouth daily.  Dispense: 90 tablet; Refill: 0 - metoprolol tartrate (LOPRESSOR) 25 MG tablet; Take 1 tablet (25 mg total) by mouth 2 (two) times daily.  Dispense: 180 tablet; Refill: 1   Htn- suboptimal- will cont with meds.  Pt in pain with his shoulder and feet.   Left shoulder pain- cont with mobic daily.  take norco prn.  Rest, ice, and avoid sleeping on that shoulder.   Bilateral foot pain- mobic and norco prn Rest, ice.  Bilateral xray foot ordered.  Neck pain left- likely related to strain from the neck from the fall.  Cont use heat/ice rest, cont with mobic/norco prn.   Return if symptoms worsen or fail to improve.

## 2021-04-06 ENCOUNTER — Other Ambulatory Visit: Payer: Self-pay | Admitting: Family Medicine

## 2021-04-06 DIAGNOSIS — M79671 Pain in right foot: Secondary | ICD-10-CM

## 2021-04-06 DIAGNOSIS — M79672 Pain in left foot: Secondary | ICD-10-CM

## 2021-04-13 ENCOUNTER — Ambulatory Visit: Payer: Medicare HMO

## 2021-04-13 ENCOUNTER — Other Ambulatory Visit: Payer: Self-pay

## 2021-04-13 ENCOUNTER — Encounter: Payer: Self-pay | Admitting: Orthopedic Surgery

## 2021-04-13 ENCOUNTER — Ambulatory Visit: Payer: Medicare HMO | Admitting: Orthopedic Surgery

## 2021-04-13 VITALS — BP 155/95 | HR 64 | Ht 65.0 in | Wt 197.0 lb

## 2021-04-13 DIAGNOSIS — M19012 Primary osteoarthritis, left shoulder: Secondary | ICD-10-CM | POA: Diagnosis not present

## 2021-04-13 DIAGNOSIS — M25512 Pain in left shoulder: Secondary | ICD-10-CM | POA: Diagnosis not present

## 2021-04-13 DIAGNOSIS — W010XXA Fall on same level from slipping, tripping and stumbling without subsequent striking against object, initial encounter: Secondary | ICD-10-CM

## 2021-04-13 NOTE — Progress Notes (Signed)
New Patient Visit  Assessment: [name] is a 65 y.o. male with the following: Glenohumeral arthritis, left  Plan: Reviewed radiographs with the patient which demonstrates moderate loss of joint space, with large inferior osteophyte off the humeral head.  His presentation is most consistent with pain associated with the glenohumeral arthritis.  We discussed multiple treatment options, and he is interested in pursuing fluoroscopic guided glenohumeral joint injection we placed this order in clinic today.  He will follow-up as needed.  We also briefly discussed the possibility of proceeding with surgery, but I do think it is important that we attempt to improve his current symptoms with an injection.  Depending on the resolution of his symptoms, we could consider further discussions regarding surgery in the future.   Follow-up: Return if symptoms worsen or fail to improve.  Subjective:  Chief Complaint  Patient presents with   Shoulder Pain    Pt with Lt shoulder pain after a fall around 03/11/21.     History of Present Illness: [name] is a 65 y.o. LHD male who presents for evaluation of left shoulder pain.  He has had intermittent shoulder pain for several years.  Approxione-2 weeks ago, he reports that he was cutting grass at his house.  He became overheated, and attempted to step off of his mower, when he lost his balance.  He landed on the left shoulder, and neck area.  He was able to get himself inside, and essentially fell asleep on the couch.  He woke up with some severe pain in the neck, with radiating pains into his hand.  He noticed that his left hand was clenching up, and his eyes started to twitch.  At that point, he was concerned that he might be suffering a stroke.  As a result, he presented to the emergency department.  CT scans in the ED of the neck and shoulder demonstrated that he had some glenohumeral arthritis.  He presents today to discuss these findings.  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  He has noted slightly restricted range of motion of the left shoulder due to pain.  However, this does not interfere with his daily activities.  He will occasionally take over-the-counter medications.  He has not had injections in the left shoulder for several years..  No recent physical therapy.   Review of Systems: No fevers or chills No numbness or tingling No chest pain No shortness of breath No bowel or bladder dysfunction No GI distress No headaches   Medical History:  Past Medical History:  Diagnosis Date   Arthritis    Aspiration pneumonia ([place]) 03/24/2014   Colostomy in place [facility])    Difficulty urinating    GERD (gastroesophageal reflux disease)    Hypertension    Ileostomy present (HCC)    Rectal cancer, uT2uN0, s/p LAR/ileostomy 03/20/2014 colostomy Aug2015 12/25/2013   TCS MAY 2015    Shortening, leg, congenital    Left   Skin irritation    Surrounding ostomy site    Past Surgical History:  Procedure Laterality Date   COLON RESECTION N/A 03/24/2014   Procedure: Diagnostic  laparotomy, takedown of coloanal anastamosis, end colostomy, with wound vac;  Surgeon: Adin Hector, MD;  [facility]: [facility];  Service: General;  Laterality: N/A;   COLONOSCOPY N/A 12/22/2013   SLF: 1. Normal mucosa in the terminal ilium. 2. Moderate diverticulosis in the transverse colon, descending colon, and sigmoid colon. 3. Rectal bleeding due ot rectal mass and  hemorrhoids 4. Moderate sized internal hemorrhoids.    COLONOSCOPY N/A 01/19/2015   Procedure: COLONOSCOPY;  Surgeon: Danie Binder, MD;  [facility];  Service: Endoscopy;  Laterality: N/A;  1215   COLONOSCOPY N/A 03/22/2018   Procedure: COLONOSCOPY;  Surgeon: Danie Binder, MD;  [facility];  Service: Endoscopy;  Laterality: N/A;  2:15pm   COLOSTOMY     ESOPHAGOGASTRODUODENOSCOPY N/A 12/22/2013   SLF: Probable Candia esophagitis2. Probable Barretts esphagus 3. Mild gastritis.    EUS  N/A 01/01/2014   Procedure: LOWER ENDOSCOPIC ULTRASOUND (EUS);  Surgeon: Milus Banister, MD;  Location: [facility];  Service: Endoscopy;  Laterality: N/A;   EXAMINATION UNDER ANESTHESIA  08/25/2014   Procedure: EXAM UNDER ANESTHESIA;  Surgeon: Michael Boston, MD;  [facility];  Service: General;;   eye reattachment and ear reattachment  Right [place] N/A 03/24/2014   Procedure: FLEXIBLE SIGMOIDOSCOPY;  Surgeon: Adin Hector, MD;  [facility];  Service: General;  Laterality: N/A;   HIP FRACTURE SURGERY Left age 41 on 04-06-1970   ILEOSTOMY     ILEOSTOMY CLOSURE N/A 08/25/2014   Procedure: TAKEDOWN OF LOOP ILEOSTOMY ;  Surgeon: Michael Boston, MD;  [facility];  Service: General;  Laterality: N/A;   INSERTION OF MESH N/A 04/11/2017   Procedure: INSERTION OF MESH ;  Surgeon: Michael Boston, MD;  [facility];  Service: General;  Laterality: N/A;  BILATERAL TAP BLOCK   TOTAL HIP ARTHROPLASTY Left 11/19/2015   Procedure: LEFT TOTAL HIP ARTHROPLASTY ANTERIOR APPROACH;  Surgeon: Mcarthur Rossetti, MD;  [facility];  Service: Orthopedics;  Laterality: Left;   VENTRAL HERNIA REPAIR N/A 04/11/2017   Procedure: LAPAROSCOPIC INCARCERATED PERISTOMAL AND INCARCERATED INCISIONAL HERNIA X2;  Surgeon: Michael Boston, MD;  [facility];  Service: General;  Laterality: N/A;  BILATERAL TAP BLOCK    Family History  Problem Relation Age of Onset   Colon cancer Sister    Emphysema Mother    Heart attack Brother    Colon polyps Neg Hx    Social History   Tobacco Use   Smoking status: Former    Packs/day: 0.00    Years: 15.00    Pack years: 0.00    Types: Cigars, Cigarettes    Quit date: 09/10/2011    Years since quitting: 9.5   Smokeless tobacco: Never  Vaping Use   Vaping Use: Never used  Substance Use Topics   Alcohol use: No    Comment: quit years ago   Drug use: No    No Known Allergies  Current Meds  Medication Sig   amLODipine (NORVASC) 5 MG  tablet Take 1 tablet (5 mg total) by mouth daily.   aspirin EC 81 MG tablet Take 81 mg by mouth daily.   baclofen (LIORESAL) 10 MG tablet Take 1 tablet (10 mg total) by mouth at bedtime. For leg cramping   Beta Carotene (VITAMIN A) 25000 UNIT capsule Take 25,000 Units by mouth daily.   co-enzyme Q-10 30 MG capsule Take 30 mg by mouth daily.   HYDROcodone-acetaminophen (NORCO) 5-325 MG tablet Take 1 tablet by mouth every 6 (six) hours as needed for moderate pain.   ketoconazole (NIZORAL) 2 % cream Apply 1 application topically daily. Apply to rash on neck.   LUTEIN PO Take 1 tablet by mouth 2 (two) times daily.    meloxicam (MOBIC) 7.5 MG tablet Take 1 tablet (7.5 mg total) by mouth daily.  metoprolol tartrate (LOPRESSOR) 25 MG tablet Take 1 tablet (25 mg total) by mouth 2 (two) times daily.   Multiple Vitamins-Minerals (ONE DAILY FOR MEN 50+ ADVANCED PO) Take 1 tablet by mouth daily.    omeprazole (PRILOSEC) 40 MG capsule TAKE 1 CAPSULE (40 MG TOTAL) BY MOUTH DAILY.   OVER THE COUNTER MEDICATION Take 1 tablet by mouth 2 (two) times daily. Vinegar tablet by mouth twice a day    saw palmetto 500 MG capsule Take 500 mg by mouth 2 (two) times daily.   Turmeric 500 MG CAPS Take 500 mg by mouth 2 (two) times daily.    vitamin B-12 (CYANOCOBALAMIN) 500 MCG tablet Take 1,000 mcg by mouth daily.   vitamin C (ASCORBIC ACID) 500 MG tablet Take 500 mg by mouth 2 (two) times daily.    Objective: BP (!) 155/95   Pulse 64   Ht '5\' 5"'$  (1.651 m)   Wt 197 lb (89.4 kg)   BMI 32.78 kg/m   Physical Exam:  General: Alert and oriented. and No acute distress. Gait: Normal gait.  Evaluation left shoulder demonstrates no deformity.  No atrophy is appreciated.  Slightly restricted range of motion compared to contralateral side.  143 is a poor flexion.  Abduction is side to 90 degrees.  Internal rotation to the lumbar spine.  15 degrees of external rotation.  He tolerates range of motion testing, all discomfort.   4/5 strength in the supraspinatus, infraspinatus.  IMAGING: I personally ordered and reviewed the following images  X-ray of the left shoulder obtained in clinic today demonstrates loss of glenohumeral joint space.  There is a large osteophyte off the anterior aspect of the humeral head.  No proximal humeral migration.  No acute injuries are noted.  Impression: Moderate left glenohumeral joint arthritis  New Medications:  No orders of the defined types were placed in this encounter.     Mordecai Rasmussen, MD  04/13/2021 11:54 AM

## 2021-04-13 NOTE — Patient Instructions (Signed)
Please call and schedule your appointment at [facility]:  [place] [phone]

## 2021-04-26 ENCOUNTER — Other Ambulatory Visit: Payer: Self-pay

## 2021-04-26 ENCOUNTER — Inpatient Hospital Stay (HOSPITAL_COMMUNITY): Payer: Medicare HMO | Attending: Hematology

## 2021-04-26 DIAGNOSIS — Z87891 Personal history of nicotine dependence: Secondary | ICD-10-CM | POA: Diagnosis not present

## 2021-04-26 DIAGNOSIS — Z79899 Other long term (current) drug therapy: Secondary | ICD-10-CM | POA: Diagnosis not present

## 2021-04-26 DIAGNOSIS — C20 Malignant neoplasm of rectum: Secondary | ICD-10-CM | POA: Insufficient documentation

## 2021-04-26 DIAGNOSIS — Z933 Colostomy status: Secondary | ICD-10-CM | POA: Insufficient documentation

## 2021-04-26 LAB — COMPREHENSIVE METABOLIC PANEL
ALT: 25 U/L (ref 0–44)
AST: 18 U/L (ref 15–41)
Albumin: 3.4 g/dL — ABNORMAL LOW (ref 3.5–5.0)
Alkaline Phosphatase: 106 U/L (ref 38–126)
Anion gap: 6 (ref 5–15)
BUN: 21 mg/dL (ref 8–23)
CO2: 27 mmol/L (ref 22–32)
Calcium: 8.7 mg/dL — ABNORMAL LOW (ref 8.9–10.3)
Chloride: 103 mmol/L (ref 98–111)
Creatinine, Ser: 0.75 mg/dL (ref 0.61–1.24)
GFR, Estimated: >60 mL/min (ref >60)
Glucose, Bld: 109 mg/dL — ABNORMAL HIGH (ref 70–99)
Potassium: 4.2 mmol/L (ref 3.5–5.1)
Sodium: 136 mmol/L (ref 135–145)
Total Bilirubin: 0.7 mg/dL (ref 0.3–1.2)
Total Protein: 6.4 g/dL — ABNORMAL LOW (ref 6.5–8.1)

## 2021-04-26 LAB — CBC WITH DIFFERENTIAL/PLATELET
Abs Immature Granulocytes: 0.03 10*3/uL (ref 0.00–0.07)
Basophils Absolute: 0 10*3/uL (ref 0.0–0.1)
Basophils Relative: 1 %
Eosinophils Absolute: 0.2 10*3/uL (ref 0.0–0.5)
Eosinophils Relative: 3 %
HCT: 44.8 % (ref 39.0–52.0)
Hemoglobin: 14.8 g/dL (ref 13.0–17.0)
Immature Granulocytes: 0 %
Lymphocytes Relative: 15 %
Lymphs Abs: 1.1 10*3/uL (ref 0.7–4.0)
MCH: 30.6 pg (ref 26.0–34.0)
MCHC: 33 g/dL (ref 30.0–36.0)
MCV: 92.8 fL (ref 80.0–100.0)
Monocytes Absolute: 0.6 10*3/uL (ref 0.1–1.0)
Monocytes Relative: 8 %
Neutro Abs: 5.6 10*3/uL (ref 1.7–7.7)
Neutrophils Relative %: 73 %
Platelets: 297 10*3/uL (ref 150–400)
RBC: 4.83 MIL/uL (ref 4.22–5.81)
RDW: 13.6 % (ref 11.5–15.5)
WBC: 7.5 10*3/uL (ref 4.0–10.5)
nRBC: 0 % (ref 0.0–0.2)

## 2021-04-27 ENCOUNTER — Encounter (HOSPITAL_COMMUNITY): Payer: Self-pay

## 2021-04-27 ENCOUNTER — Ambulatory Visit (HOSPITAL_COMMUNITY)
Admission: RE | Admit: 2021-04-27 | Discharge: 2021-04-27 | Disposition: A | Discharge status: Home / Self Care | Payer: Medicare HMO | Source: Ambulatory Visit | Attending: Orthopedic Surgery | Admitting: Orthopedic Surgery

## 2021-04-27 DIAGNOSIS — M19012 Primary osteoarthritis, left shoulder: Secondary | ICD-10-CM

## 2021-04-27 LAB — CEA: CEA: 3.4 ng/mL (ref 0.0–4.7)

## 2021-04-27 MED ORDER — BUPIVACAINE HCL (PF) 0.5 % IJ SOLN
INTRAMUSCULAR | Status: AC
  Administered 2021-04-27: 3 mL
  Filled 2021-04-27: qty 30

## 2021-04-27 MED ORDER — LIDOCAINE HCL (PF) 1 % IJ SOLN
INTRAMUSCULAR | Status: AC
  Administered 2021-04-27: 5 mL
  Filled 2021-04-27: qty 5

## 2021-04-27 MED ORDER — METHYLPREDNISOLONE ACETATE 40 MG/ML IJ SUSP
INTRAMUSCULAR | Status: AC
  Administered 2021-04-27: 40 mg
  Filled 2021-04-27: qty 1

## 2021-04-27 MED ORDER — IOHEXOL 300 MG/ML  SOLN
30.0000 mL | Freq: Once | INTRAMUSCULAR | Status: AC | PRN
  Administered 2021-04-27: 2 mL via INTRA_ARTICULAR

## 2021-04-27 MED ORDER — POVIDONE-IODINE 10 % EX SOLN
CUTANEOUS | Status: AC
  Administered 2021-04-27: 1
  Filled 2021-04-27: qty 15

## 2021-04-27 NOTE — Procedures (Signed)
Preprocedure Dx: LEFT glenohumeral osteoarthritis Postprocedure Dx: LEFT glenohumeral osteoarthritis Procedure  Fluoroscopically guided LEFT shoulder joint injection therapeutic Radiologist:  Thornton Papas Anesthesia:  3.5 ml of 1% lidocaine Injectate:  40 mg DepoMedrol, 3 ml of Sensorcaine 0.5% Fluoro time:  1 minutes 12 seconds EBL:   None Complications: None

## 2021-04-28 ENCOUNTER — Ambulatory Visit: Payer: Medicare HMO | Admitting: Podiatry

## 2021-04-28 ENCOUNTER — Ambulatory Visit (INDEPENDENT_AMBULATORY_CARE_PROVIDER_SITE_OTHER): Payer: Medicare HMO

## 2021-04-28 ENCOUNTER — Other Ambulatory Visit: Payer: Self-pay

## 2021-04-28 DIAGNOSIS — M778 Other enthesopathies, not elsewhere classified: Secondary | ICD-10-CM | POA: Diagnosis not present

## 2021-04-28 MED ORDER — TRIAMCINOLONE ACETONIDE 40 MG/ML IJ SUSP
40.0000 mg | Freq: Once | INTRAMUSCULAR | Status: AC
  Administered 2021-04-28: 40 mg

## 2021-04-28 NOTE — Progress Notes (Signed)
Subjective:  Patient ID: [name], male    DOB: 07/28/1956,  MRN: [id number] HPI Chief Complaint  Patient presents with   Foot Pain    Pt states that he has bilateral pain on the ball of his feet. 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     65 y.o. male presents with the above complaint.   ROS: He denies fever chills nausea vomit muscle aches pains calf pain back pain chest pain shortness of breath.  Past Medical History:  Diagnosis Date   Arthritis    Aspiration pneumonia ([place]) 03/24/2014   Colostomy in place [facility])    Difficulty urinating    GERD (gastroesophageal reflux disease)    Hypertension    Ileostomy present (HCC)    Rectal cancer, uT2uN0, s/p LAR/ileostomy 03/20/2014 colostomy Aug2015 12/25/2013   TCS MAY 2015    Shortening, leg, congenital    Left   Skin irritation    Surrounding ostomy site   Past Surgical History:  Procedure Laterality Date   COLON RESECTION N/A 03/24/2014   Procedure: Diagnostic  laparotomy, takedown of coloanal anastamosis, end colostomy, with wound vac;  Surgeon: Adin Hector, MD;  [facility];  Service: General;  Laterality: N/A;   COLONOSCOPY N/A 12/22/2013   SLF: 1. Normal mucosa in the terminal ilium. 2. Moderate diverticulosis in the transverse colon, descending colon, and sigmoid colon. 3. Rectal bleeding due ot rectal mass and hemorrhoids 4. Moderate sized internal hemorrhoids.    COLONOSCOPY N/A 01/19/2015   Procedure: COLONOSCOPY;  Surgeon: Danie Binder, MD;  [facility];  Service: Endoscopy;  Laterality: N/A;  1215   COLONOSCOPY N/A 03/22/2018   Procedure: COLONOSCOPY;  Surgeon: Danie Binder, MD;  [facility];  Service: Endoscopy;  Laterality: N/A;  2:15pm   COLOSTOMY     ESOPHAGOGASTRODUODENOSCOPY N/A 12/22/2013   SLF: Probable Candia esophagitis2. Probable Barretts esphagus 3. Mild gastritis.    EUS N/A 01/01/2014   Procedure: LOWER ENDOSCOPIC ULTRASOUND (EUS);  Surgeon: Milus Banister, MD;   Location: [facility];  Service: Endoscopy;  Laterality: N/A;   EXAMINATION UNDER ANESTHESIA  08/25/2014   Procedure: EXAM UNDER ANESTHESIA;  Surgeon: Michael Boston, MD;  [facility];  Service: General;;   eye reattachment and ear reattachment  Right [place] N/A 03/24/2014   Procedure: FLEXIBLE SIGMOIDOSCOPY;  Surgeon: Adin Hector, MD;  [facility];  Service: General;  Laterality: N/A;   HIP FRACTURE SURGERY Left age 86 on 04-06-1970   ILEOSTOMY     ILEOSTOMY CLOSURE N/A 08/25/2014   Procedure: TAKEDOWN OF LOOP ILEOSTOMY ;  Surgeon: Michael Boston, MD;  [facility];  Service: General;  Laterality: N/A;   INSERTION OF MESH N/A 04/11/2017   Procedure: INSERTION OF MESH ;  Surgeon: Michael Boston, MD;  [facility];  Service: General;  Laterality: N/A;  BILATERAL TAP BLOCK   TOTAL HIP ARTHROPLASTY Left 11/19/2015   Procedure: LEFT TOTAL HIP ARTHROPLASTY ANTERIOR APPROACH;  Surgeon: Mcarthur Rossetti, MD;  [facility];  Service: Orthopedics;  Laterality: Left;   VENTRAL HERNIA REPAIR N/A 04/11/2017   Procedure: LAPAROSCOPIC INCARCERATED PERISTOMAL AND INCARCERATED INCISIONAL HERNIA X2;  Surgeon: Michael Boston, MD;  [facility];  Service: General;  Laterality: N/A;  BILATERAL TAP BLOCK    Current Outpatient Medications:    amLODipine (NORVASC) 5 MG tablet, Take 1 tablet (5 mg total) by mouth daily., Disp: 90 tablet,  Rfl: 0   aspirin EC 81 MG tablet, Take 81 mg by mouth daily., Disp: , Rfl:    baclofen (LIORESAL) 10 MG tablet, Take 1 tablet (10 mg total) by mouth at bedtime. For leg cramping, Disp: 30 tablet, Rfl: 3   Beta Carotene (VITAMIN A) 25000 UNIT capsule, Take 25,000 Units by mouth daily., Disp: , Rfl:    co-enzyme Q-10 30 MG capsule, Take 30 mg by mouth daily., Disp: , Rfl:    HYDROcodone-acetaminophen (NORCO) 5-325 MG tablet, Take 1 tablet by mouth every 6 (six) hours as needed for moderate pain., Disp: 21 tablet, Rfl: 0   ketoconazole  (NIZORAL) 2 % cream, Apply 1 application topically daily. Apply to rash on neck., Disp: 30 g, Rfl: 0   LUTEIN PO, Take 1 tablet by mouth 2 (two) times daily. , Disp: , Rfl:    meloxicam (MOBIC) 7.5 MG tablet, Take 1 tablet (7.5 mg total) by mouth daily., Disp: 30 tablet, Rfl: 0   metoprolol tartrate (LOPRESSOR) 25 MG tablet, Take 1 tablet (25 mg total) by mouth 2 (two) times daily., Disp: 180 tablet, Rfl: 1   Multiple Vitamins-Minerals (ONE DAILY FOR MEN 50+ ADVANCED PO), Take 1 tablet by mouth daily. , Disp: , Rfl:    omeprazole (PRILOSEC) 40 MG capsule, TAKE 1 CAPSULE (40 MG TOTAL) BY MOUTH DAILY., Disp: 90 capsule, Rfl: 1   OVER THE COUNTER MEDICATION, Take 1 tablet by mouth 2 (two) times daily. Vinegar tablet by mouth twice a day , Disp: , Rfl:    saw palmetto 500 MG capsule, Take 500 mg by mouth 2 (two) times daily., Disp: , Rfl:    Turmeric 500 MG CAPS, Take 500 mg by mouth 2 (two) times daily. , Disp: , Rfl:    vitamin B-12 (CYANOCOBALAMIN) 500 MCG tablet, Take 1,000 mcg by mouth daily., Disp: , Rfl:    vitamin C (ASCORBIC ACID) 500 MG tablet, Take 500 mg by mouth 2 (two) times daily., Disp: , Rfl:   No Known Allergies Review of Systems Objective:  There were no vitals filed for this visit.  General: Well developed, nourished, in no acute distress, alert and oriented x3   Dermatological: Skin is warm, dry and supple bilateral. Nails x 10 are well maintained; remaining integument appears unremarkable at this time. There are no open sores, no preulcerative lesions, no rash or signs of infection present.  Vascular: Dorsalis Pedis artery and Posterior Tibial artery pedal pulses are 2/4 bilateral with immedate capillary fill time. Pedal hair growth present. No varicosities and no lower extremity edema present bilateral.   Neruologic: Grossly intact via light touch bilateral. Vibratory intact via tuning fork bilateral. Protective threshold with Semmes Wienstein monofilament intact to all  pedal sites bilateral. Patellar and Achilles deep tendon reflexes 2+ bilateral. No Babinski or clonus noted bilateral.   Musculoskeletal: No gross boney pedal deformities bilateral. No pain, crepitus, or limitation noted with foot and ankle range of motion bilateral. Muscular strength 5/5 in all groups tested bilateral.  Pain in range of motion of the second metatarsophalangeal joint right greater than left mild hammertoe deformity mild hallux valgus formation noted.  Gait: Unassisted, Nonantalgic.    Radiographs:  Radiographs taken today demonstrate an osseously mature individual with no acute findings.  Assessment & Plan:   Assessment: Capsulitis second metatarsophalangeal joint bilateral.  Plan: Injected the of Kenalog 10 mg Kenalog 5 mg Marcaine point maximal tenderness around the joints.  Tolerated procedure well I discussed appropriate shoe gear stretching  exercise ice therapy sugar modifications and I would like to follow-up with him in 6 weeks.     Sorcha Rotunno T. [place], [place]

## 2021-05-02 NOTE — Progress Notes (Signed)
[place], [place] [address]   CLINIC:  Medical Oncology/Hematology  PCP:  Erven Colla, DO [address] / [place] [address] [phone]   REASON FOR VISIT:  Follow-up for rectal cancer  PRIOR THERAPY:  Low anterior resection with ileostomy on 03/20/2014  NGS Results: not done  CURRENT THERAPY: surveillance  BRIEF ONCOLOGIC HISTORY:  Oncology History  Rectal cancer, uT2uN0, s/p LAR/ileostomy 03/20/2014 colostomy Aug2015  12/25/2013 Initial Diagnosis   Rectal cancer, posterior 4cm from anal verge     CANCER STAGING: Cancer Staging Rectal cancer, uT2uN0, s/p LAR/ileostomy 03/20/2014 colostomy Aug2015 Staging form: Colon and Rectum, AJCC 7th Edition - Clinical: T2, N0 - Unsigned   INTERVAL HISTORY:  [name], a 65 y.o. male, returns for routine follow-up of his rectal cancer. [name] was last seen on 05/03/2020.   Today he reports feeling good. He denies hematochezia and abdominal pain. He empties his colostomy bag 2 times a day.  REVIEW OF SYSTEMS:  Review of Systems  Constitutional:  Negative for appetite change and fatigue.  Respiratory:  Positive for shortness of breath.   Gastrointestinal:  Negative for abdominal pain and blood in stool.  Musculoskeletal:  Positive for arthralgias (L shoulder).  Neurological:  Positive for numbness (hands).  All other systems reviewed and are negative.  PAST MEDICAL/SURGICAL HISTORY:  Past Medical History:  Diagnosis Date   Arthritis    Aspiration pneumonia ([place]) 03/24/2014   Colostomy in place [facility])    Difficulty urinating    GERD (gastroesophageal reflux disease)    Hypertension    Ileostomy present (HCC)    Rectal cancer, uT2uN0, s/p LAR/ileostomy 03/20/2014 colostomy Aug2015 12/25/2013   TCS MAY 2015    Shortening, leg, congenital    Left   Skin irritation    Surrounding ostomy site   Past Surgical History:  Procedure Laterality Date   COLON RESECTION N/A  03/24/2014   Procedure: Diagnostic  laparotomy, takedown of coloanal anastamosis, end colostomy, with wound vac;  Surgeon: Adin Hector, MD;  Location: [facility];  Service: General;  Laterality: N/A;   COLONOSCOPY N/A 12/22/2013   SLF: 1. Normal mucosa in the terminal ilium. 2. Moderate diverticulosis in the transverse colon, descending colon, and sigmoid colon. 3. Rectal bleeding due ot rectal mass and hemorrhoids 4. Moderate sized internal hemorrhoids.    COLONOSCOPY N/A 01/19/2015   Procedure: COLONOSCOPY;  Surgeon: Danie Binder, MD;  [facility];  Service: Endoscopy;  Laterality: N/A;  1215   COLONOSCOPY N/A 03/22/2018   Procedure: COLONOSCOPY;  Surgeon: Danie Binder, MD;  [facility];  Service: Endoscopy;  Laterality: N/A;  2:15pm   COLOSTOMY     ESOPHAGOGASTRODUODENOSCOPY N/A 12/22/2013   SLF: Probable Candia esophagitis2. Probable Barretts esphagus 3. Mild gastritis.    EUS N/A 01/01/2014   Procedure: LOWER ENDOSCOPIC ULTRASOUND (EUS);  Surgeon: Milus Banister, MD;  Location: [facility];  Service: Endoscopy;  Laterality: N/A;   EXAMINATION UNDER ANESTHESIA  08/25/2014   Procedure: EXAM UNDER ANESTHESIA;  Surgeon: Michael Boston, MD;  Location: [facility];  Service: General;;   eye reattachment and ear reattachment  Right [place] N/A 03/24/2014   Procedure: FLEXIBLE SIGMOIDOSCOPY;  Surgeon: Adin Hector, MD;  Location: [facility];  Service: General;  Laterality: N/A;   HIP FRACTURE SURGERY Left age 65 on 04-06-1970   ILEOSTOMY     ILEOSTOMY CLOSURE N/A 08/25/2014   Procedure: TAKEDOWN  OF LOOP ILEOSTOMY ;  Surgeon: Michael Boston, MD;  Location: [facility];  Service: General;  Laterality: N/A;   INSERTION OF MESH N/A 04/11/2017   Procedure: INSERTION OF MESH ;  Surgeon: Michael Boston, MD;  [facility];  Service: General;  Laterality: N/A;  BILATERAL TAP BLOCK   TOTAL HIP ARTHROPLASTY Left 11/19/2015   Procedure: LEFT TOTAL HIP ARTHROPLASTY ANTERIOR APPROACH;   Surgeon: Mcarthur Rossetti, MD;  [facility]: [facility];  Service: Orthopedics;  Laterality: Left;   VENTRAL HERNIA REPAIR N/A 04/11/2017   Procedure: LAPAROSCOPIC INCARCERATED PERISTOMAL AND INCARCERATED INCISIONAL HERNIA X2;  Surgeon: Michael Boston, MD;  [facility];  Service: General;  Laterality: N/A;  BILATERAL TAP BLOCK    SOCIAL HISTORY:  Social History   Socioeconomic History   Marital status: Married    Spouse name: Not on file   Number of children: Not on file   Years of education: Not on file   Highest education level: Not on file  Occupational History   Not on file  Tobacco Use   Smoking status: Former    Packs/day: 0.00    Years: 15.00    Pack years: 0.00    Types: Cigars, Cigarettes    Quit date: 09/10/2011    Years since quitting: 9.6   Smokeless tobacco: Never  Vaping Use   Vaping Use: Never used  Substance and Sexual Activity   Alcohol use: No    Comment: quit years ago   Drug use: No   Sexual activity: Not on file  Other Topics Concern   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   Social Determinants of Health   Financial Resource Strain: Low Risk    Difficulty of Paying Living Expenses: Not very hard  Food Insecurity: No Food Insecurity   Worried About Charity fundraiser in the Last Year: Never true   Ran Out of Food in the Last Year: Never true  Transportation Needs: No Transportation Needs   Lack of Transportation (Medical): No   Lack of Transportation (Non-Medical): No  Physical Activity: Sufficiently Active   Days of Exercise per Week: 5 days   Minutes of Exercise per Session: 50 min  Stress: No Stress Concern Present   Feeling of Stress : Not at all  Social Connections: Moderately Integrated   Frequency of Communication with Friends and Family: More than three times a week   Frequency of Social Gatherings with Friends and Family: More than three times a week   Attends Religious Services: More than 4  times per year   Active Member of [employer] or Organizations: No   Attends Music therapist: Never   Marital Status: Married  Human resources officer Violence: Not At Risk   Fear of Current or Ex-Partner: No   Emotionally Abused: No   Physically Abused: No   Sexually Abused: No    FAMILY HISTORY:  Family History  Problem Relation Age of Onset   Colon cancer Sister    Emphysema Mother    Heart attack Brother    Colon polyps Neg Hx     CURRENT MEDICATIONS:  Current Outpatient Medications  Medication Sig Dispense Refill   amLODipine (NORVASC) 5 MG tablet Take 1 tablet (5 mg total) by mouth daily. 90 tablet 0   aspirin EC 81 MG tablet Take 81 mg by mouth daily.     baclofen (LIORESAL) 10 MG tablet Take 1 tablet (10 mg total) by  mouth at bedtime. For leg cramping 30 tablet 3   Beta Carotene (VITAMIN A) 25000 UNIT capsule Take 25,000 Units by mouth daily.     co-enzyme Q-10 30 MG capsule Take 30 mg by mouth daily.     HYDROcodone-acetaminophen (NORCO) 5-325 MG tablet Take 1 tablet by mouth every 6 (six) hours as needed for moderate pain. 21 tablet 0   ketoconazole (NIZORAL) 2 % cream Apply 1 application topically daily. Apply to rash on neck. 30 g 0   LUTEIN PO Take 1 tablet by mouth 2 (two) times daily.      meloxicam (MOBIC) 7.5 MG tablet Take 1 tablet (7.5 mg total) by mouth daily. 30 tablet 0   metoprolol tartrate (LOPRESSOR) 25 MG tablet Take 1 tablet (25 mg total) by mouth 2 (two) times daily. 180 tablet 1   Multiple Vitamins-Minerals (ONE DAILY FOR MEN 50+ ADVANCED PO) Take 1 tablet by mouth daily.      omeprazole (PRILOSEC) 40 MG capsule TAKE 1 CAPSULE (40 MG TOTAL) BY MOUTH DAILY. 90 capsule 1   OVER THE COUNTER MEDICATION Take 1 tablet by mouth 2 (two) times daily. Vinegar tablet by mouth twice a day      saw palmetto 500 MG capsule Take 500 mg by mouth 2 (two) times daily.     Turmeric 500 MG CAPS Take 500 mg by mouth 2 (two) times daily.      vitamin B-12  (CYANOCOBALAMIN) 500 MCG tablet Take 1,000 mcg by mouth daily.     vitamin C (ASCORBIC ACID) 500 MG tablet Take 500 mg by mouth 2 (two) times daily.     No current facility-administered medications for this visit.    ALLERGIES:  No Known Allergies  PHYSICAL EXAM:  Performance status (ECOG): 1 - Symptomatic but completely ambulatory  There were no vitals filed for this visit. Wt Readings from Last 3 Encounters:  04/13/21 197 lb (89.4 kg)  04/04/21 198 lb (89.8 kg)  04/01/21 192 lb (87.1 kg)   Physical Exam Vitals reviewed.  Constitutional:      Appearance: Normal appearance.  Cardiovascular:     Rate and Rhythm: Normal rate and regular rhythm.     Pulses: Normal pulses.     Heart sounds: Normal heart sounds.  Pulmonary:     Effort: Pulmonary effort is normal.     Breath sounds: Normal breath sounds.  Abdominal:     Palpations: Abdomen is soft. There is no hepatomegaly, splenomegaly or mass.     Tenderness: There is no abdominal tenderness.  Neurological:     General: No focal deficit present.     Mental Status: He is alert and oriented to person, place, and time.  Psychiatric:        Mood and Affect: Mood normal.        Behavior: Behavior normal.     LABORATORY DATA:  I have reviewed the labs as listed.  CBC Latest Ref Rng & Units 04/26/2021 11/19/2020 04/27/2020  WBC 4.0 - 10.5 K/uL 7.5 8.2 6.7  Hemoglobin 13.0 - 17.0 g/dL 14.8 14.9 14.9  Hematocrit 39.0 - 52.0 % 44.8 43.3 45.1  Platelets 150 - 400 K/uL 297 317 295   CMP Latest Ref Rng & Units 04/26/2021 11/19/2020 06/10/2020  Glucose 70 - 99 mg/dL 109(H) 73 -  BUN 8 - 23 mg/dL 21 18 -  Creatinine 0.61 - 1.24 mg/dL 0.75 0.87 1.00  Sodium 135 - 145 mmol/L 136 140 -  Potassium 3.5 - 5.1 mmol/L  4.2 4.5 -  Chloride 98 - 111 mmol/L 103 102 -  CO2 22 - 32 mmol/L 27 23 -  Calcium 8.9 - 10.3 mg/dL 8.7(L) 9.5 -  Total Protein 6.5 - 8.1 g/dL 6.4(L) 6.5 -  Total Bilirubin 0.3 - 1.2 mg/dL 0.7 0.3 -  Alkaline Phos 38 - 126 U/L 106  119 -  AST 15 - 41 U/L 18 16 -  ALT 0 - 44 U/L 25 20 -    DIAGNOSTIC IMAGING:  I have independently reviewed the scans and discussed with the patient. DG Shoulder Left  Result Date: 04/18/2021 X-ray of the left shoulder obtained in clinic today demonstrates loss of glenohumeral joint space.  There is a large osteophyte off the anterior aspect of the humeral head.  No proximal humeral migration.  No acute injuries are noted. Impression: Moderate left glenohumeral joint arthritis  DG Foot Complete Left  Result Date: 04/05/2021 CLINICAL DATA:  Left foot pain. EXAM: LEFT FOOT - COMPLETE 3+ VIEW COMPARISON:  No prior. FINDINGS: Degenerative change first MTP joint. No evidence of fracture dislocation. No radiopaque foreign body. IMPRESSION: Degenerative changes first MTP joint.  No acute abnormality. Electronically Signed   By: Marcello Moores  Register M.D.   On: 04/05/2021 08:57   DG Foot Complete Right  Result Date: 04/05/2021 CLINICAL DATA:  Right foot pain.  No known injury. EXAM: RIGHT FOOT COMPLETE - 3+ VIEW COMPARISON:  No recent prior. FINDINGS: Prominent degenerative change first MTP. No acute or focal bony abnormality. No evidence of fracture or dislocation. IMPRESSION: Prominent degenerative changes first MTP.  No acute abnormality. Electronically Signed   By: Marcello Moores  Register M.D.   On: 04/05/2021 08:35   DG FLUORO GUIDED NEEDLE PLC ASPIRATION/INJECTION LOC  Result Date: 04/27/2021 CLINICAL DATA:  LEFT glenohumeral osteoarthritis, chronic shoulder pain EXAM: THERAPEUTIC LEFT SHOULDER INJECTION UNDER FLUOROSCOPY TECHNIQUE: An appropriate skin entrance site was determined. The site was marked, prepped with Betadine, draped in the usual sterile fashion, and infiltrated locally with buffered Lidocaine. 22 gauge spinal needle was advanced to the superomedial margin of the humeral head under intermittent fluoroscopy. Intra-articular position of the needle tip was confirmed with 2 mL of Omnipaque 300.  Patient was then injected with 40 mg of Depo-Medrol and 3 mL of Sensorcaine 0.5%. No immediate complication. FLUOROSCOPY TIME:  Fluoroscopy Time:  1 minute 12 seconds Radiation Exposure Index (if provided by the fluoroscopic device): 12.7 mGy Number of Acquired Spot Images: 1 FINDINGS: Patient reported significant immediate symptomatic improvement following injection. IMPRESSION: Technically successful therapeutic LEFT shoulder joint injection. Electronically Signed   By: Lavonia Dana M.D.   On: 04/27/2021 10:08     ASSESSMENT:  1.  Stage II (PT 2 PN 0) rectal adenocarcinoma: - Status post low anterior resection with ileostomy on 03/20/2014, pathology showing grade 2 moderately differentiated adenocarcinoma involving distal one third of the rectum, MMR normal. -PET CT scan prior to surgical resection on 01/09/2014 negative for metastatic disease. -Takedown of loop ileostomy in January 2016. - Colonoscopy on 03/22/2018 shows diverticulosis in the transverse colon and in the ascending colon.  Has close to 35 cm of colon remaining.  Surgical stoma is normal. -Family history significant for sister having colon cancer at age 65 and died of it. - CT of the abdomen and pelvis on 04/16/2018 shows interval reduction of peristomal hernia of small bowel.  No evidence of recurrence of his cancer.   PLAN:  1.  Stage II (PT 2 PN 0) rectal adenocarcinoma: - He  denies any change in consistency of bowels or bleeding into the colostomy bag. - He is emptying colostomy bag about twice daily on average. - He does not have any clinical signs or symptoms of recurrence at this time. - Reviewed labs from 04/26/2021 which showed normal CBC and LFTs.  CEA was 3.4. - Recommend next colonoscopy around August 2024. - RTC 1 year for follow-up with repeat labs.   Orders placed this encounter:  No orders of the defined types were placed in this encounter.    Derek Jack, MD [employer] [phone]   I,  Thana Ates, am acting as a scribe for Dr. Derek Jack.  I, Derek Jack MD, have reviewed the above documentation for accuracy and completeness, and I agree with the above.

## 2021-05-03 ENCOUNTER — Inpatient Hospital Stay (HOSPITAL_COMMUNITY): Payer: Medicare HMO | Admitting: Hematology

## 2021-05-03 ENCOUNTER — Other Ambulatory Visit: Payer: Self-pay

## 2021-05-03 VITALS — BP 148/90 | HR 67 | Temp 96.9°F | Resp 18 | Wt 196.2 lb

## 2021-05-03 DIAGNOSIS — C20 Malignant neoplasm of rectum: Secondary | ICD-10-CM

## 2021-05-03 DIAGNOSIS — Z79899 Other long term (current) drug therapy: Secondary | ICD-10-CM | POA: Diagnosis not present

## 2021-05-03 DIAGNOSIS — Z87891 Personal history of nicotine dependence: Secondary | ICD-10-CM | POA: Diagnosis not present

## 2021-05-03 DIAGNOSIS — Z933 Colostomy status: Secondary | ICD-10-CM | POA: Diagnosis not present

## 2021-05-03 NOTE — Patient Instructions (Addendum)
[facility] at [facility] Discharge Instructions  You were seen today by Dr. Katragadda. He went over your recent results. Dr. Katragadda will see you back in 1 year for labs and follow up.   Thank you for choosing [facility] at [facility] to provide your oncology and hematology care.  To afford each patient quality time with our provider, please arrive at least 15 minutes before your scheduled appointment time.   If you have a lab appointment with the Cancer Center please come in thru the Main Entrance and check in at the main information desk  You need to re-schedule your appointment should you arrive 10 or more minutes late.  We strive to give you quality time with our providers, and arriving late affects you and other patients whose appointments are after yours.  Also, if you no show three or more times for appointments you may be dismissed from the clinic at the providers discretion.     Again, thank you for choosing [facility].  Our hope is that these requests will decrease the amount of time that you wait before being seen by our physicians.       _____________________________________________________________  Should you have questions after your visit to [facility], please contact our office at [phone] between the hours of 8:00 a.m. and 4:30 p.m.  Voicemails left after 4:00 p.m. will not be returned until the following business day.  For prescription refill requests, have your pharmacy contact our office and allow 72 hours.    Cancer Center Support Programs:   > Cancer Support Group  2nd Tuesday of the month 1pm-2pm, Journey Room    

## 2021-05-05 DIAGNOSIS — Z85038 Personal history of other malignant neoplasm of large intestine: Secondary | ICD-10-CM | POA: Diagnosis not present

## 2021-05-05 DIAGNOSIS — Z933 Colostomy status: Secondary | ICD-10-CM | POA: Diagnosis not present

## 2021-05-09 DIAGNOSIS — Z85038 Personal history of other malignant neoplasm of large intestine: Secondary | ICD-10-CM | POA: Diagnosis not present

## 2021-05-09 DIAGNOSIS — Z933 Colostomy status: Secondary | ICD-10-CM | POA: Diagnosis not present

## 2021-05-11 ENCOUNTER — Other Ambulatory Visit: Payer: Self-pay | Admitting: Podiatry

## 2021-05-11 DIAGNOSIS — M778 Other enthesopathies, not elsewhere classified: Secondary | ICD-10-CM

## 2021-06-09 ENCOUNTER — Ambulatory Visit: Payer: Medicare HMO | Admitting: Podiatry

## 2021-06-23 DIAGNOSIS — Z933 Colostomy status: Secondary | ICD-10-CM | POA: Diagnosis not present

## 2021-06-23 DIAGNOSIS — Z85038 Personal history of other malignant neoplasm of large intestine: Secondary | ICD-10-CM | POA: Diagnosis not present

## 2021-06-24 ENCOUNTER — Other Ambulatory Visit: Payer: Self-pay | Admitting: Family Medicine

## 2021-08-03 DIAGNOSIS — Z933 Colostomy status: Secondary | ICD-10-CM | POA: Diagnosis not present

## 2021-08-03 DIAGNOSIS — Z85038 Personal history of other malignant neoplasm of large intestine: Secondary | ICD-10-CM | POA: Diagnosis not present

## 2021-09-06 IMAGING — DX DG FOOT COMPLETE 3+V*L*
3 series · 3 of 3 positions shown · non-contrast
Comparison: No prior.

CLINICAL DATA: Left foot pain.

EXAM:
LEFT FOOT - COMPLETE 3+ VIEW

[foot ap]
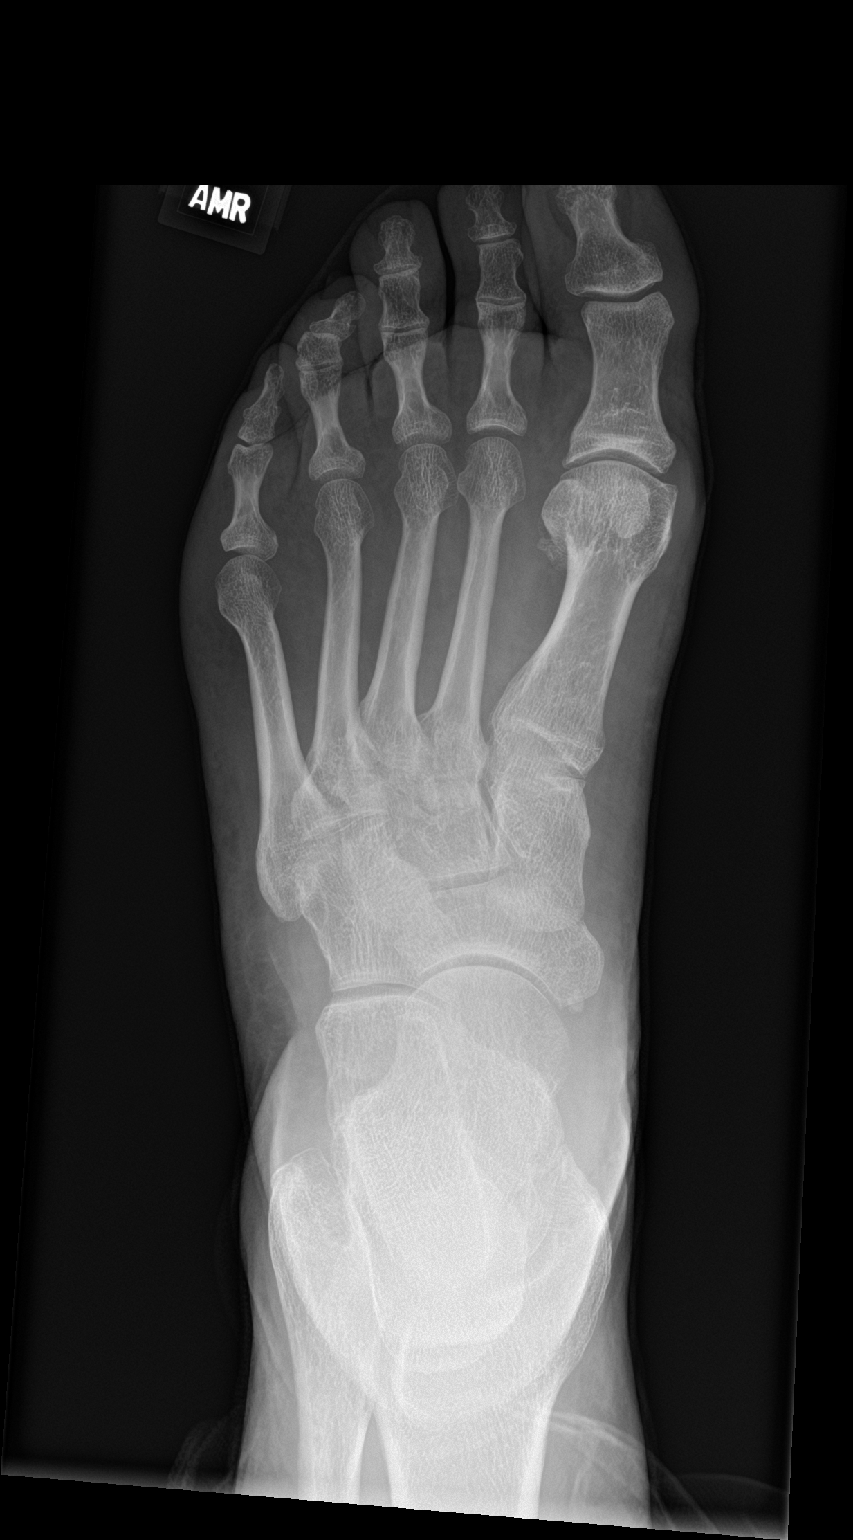

[foot obl]
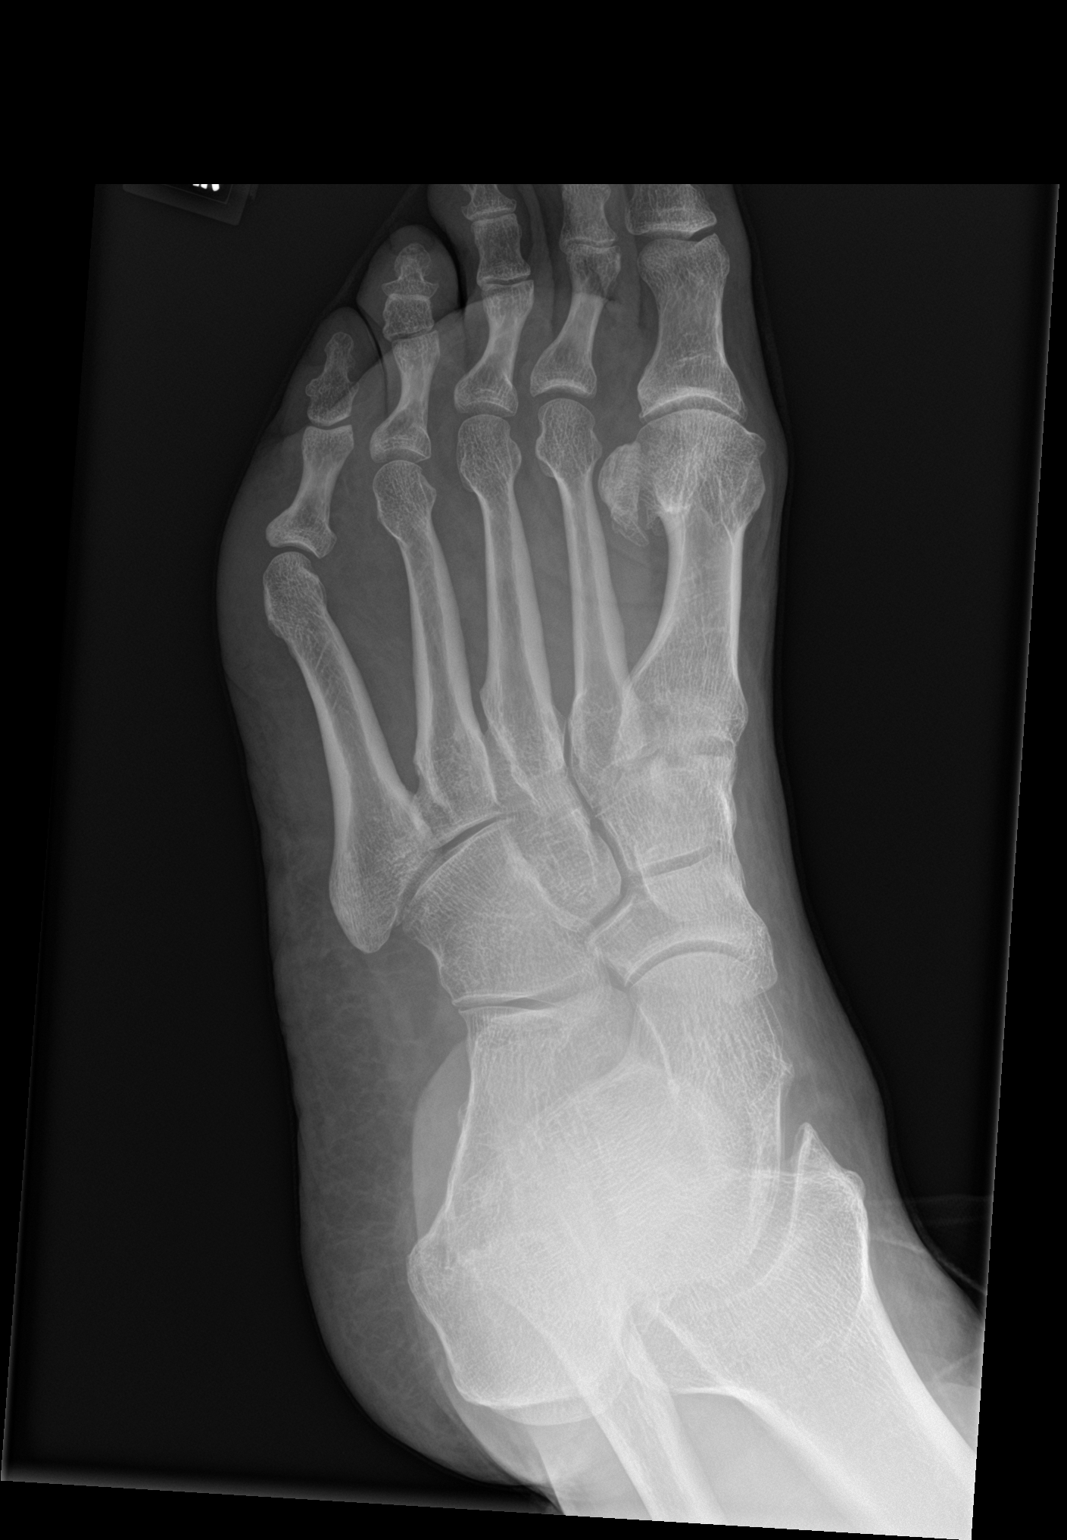

[foot lat]
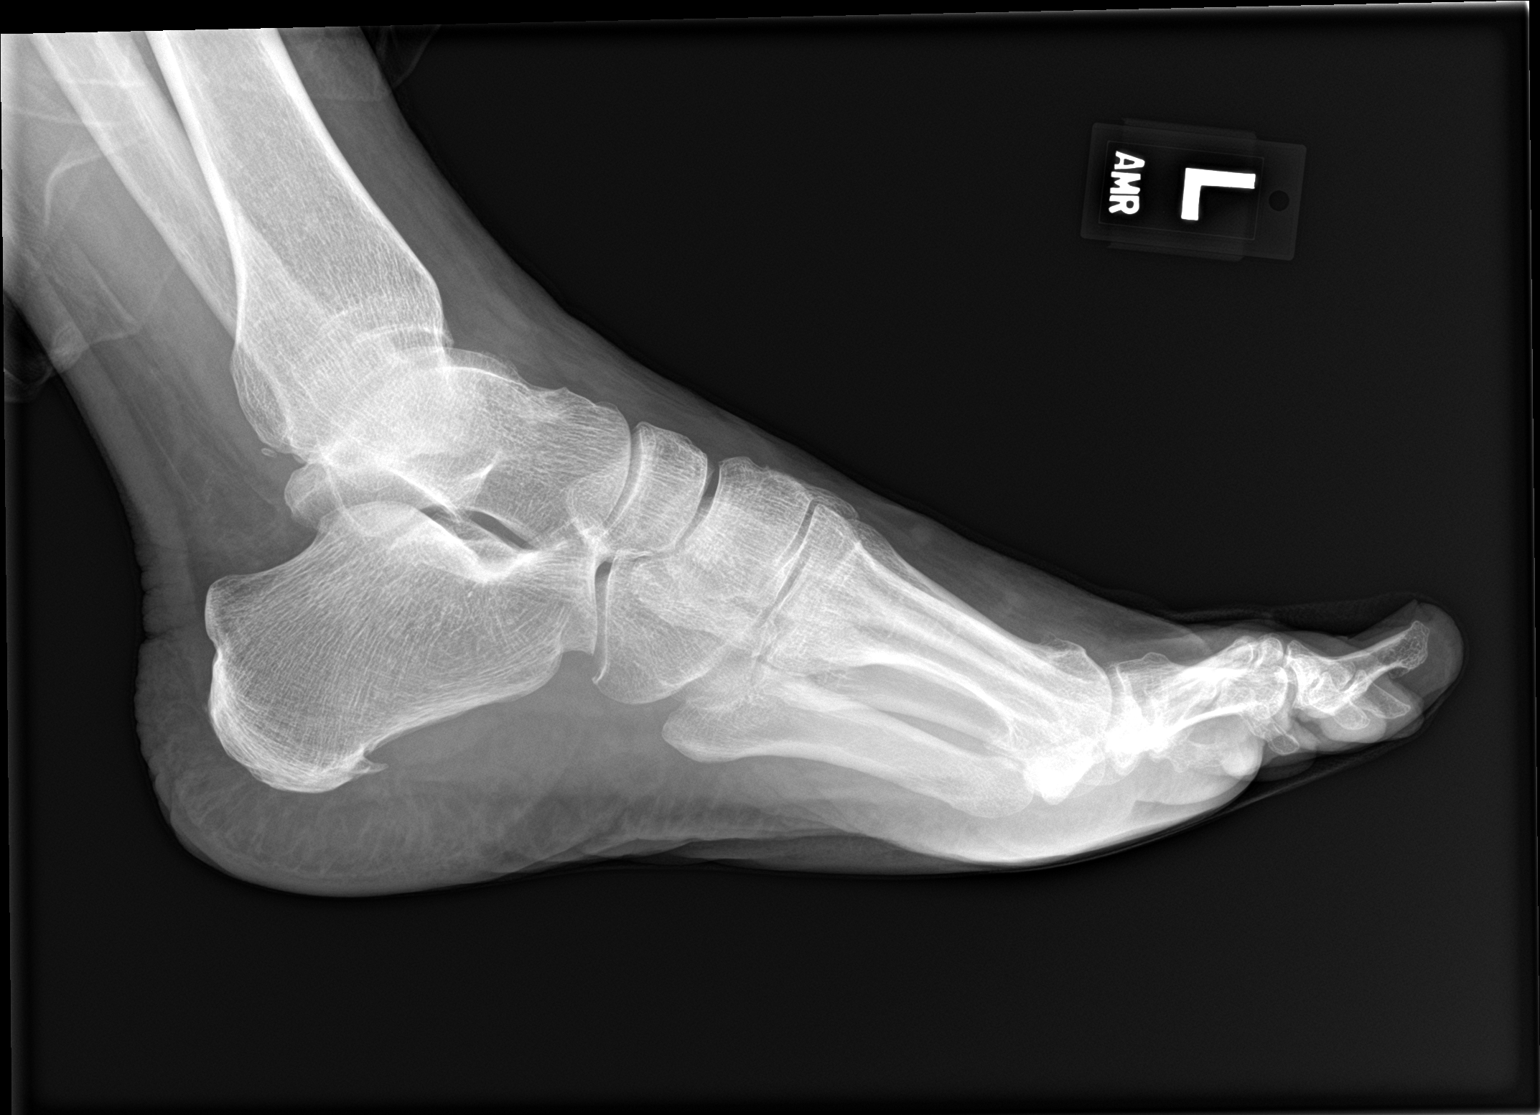

[3 of 3 positions shown; findings below may reference images not displayed]

FINDINGS: Degenerative change first MTP joint. No evidence of fracture
dislocation. No radiopaque foreign body.
IMPRESSION: Degenerative changes first MTP joint.  No acute abnormality.

## 2021-09-06 IMAGING — DX DG FOOT COMPLETE 3+V*R*
3 series · 3 of 3 positions shown · non-contrast
Comparison: No recent prior.

CLINICAL DATA: Right foot pain.  No known injury.

EXAM:
RIGHT FOOT COMPLETE - 3+ VIEW

[foot ap]
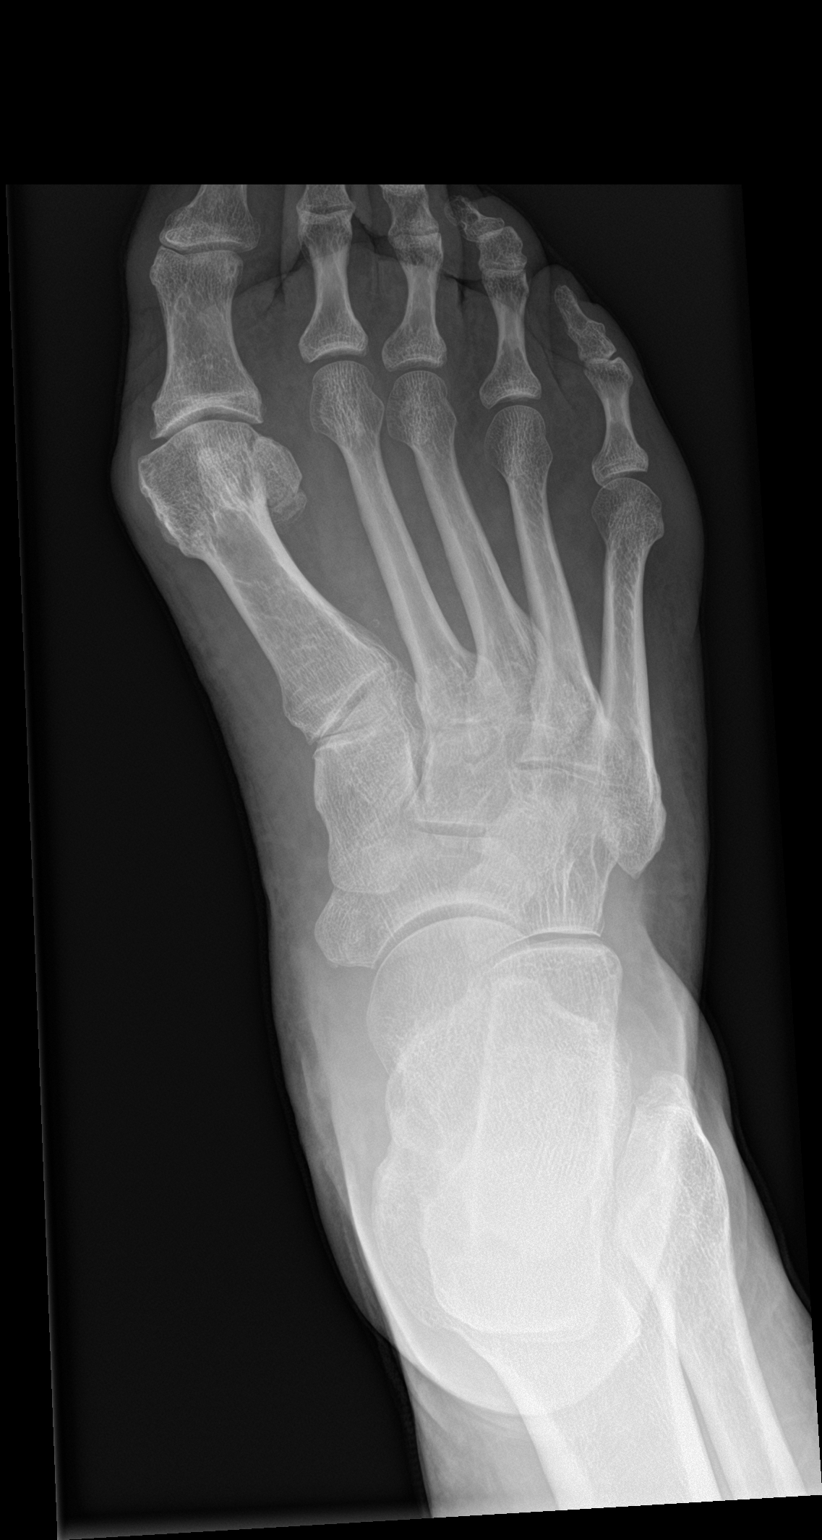

[foot obl]
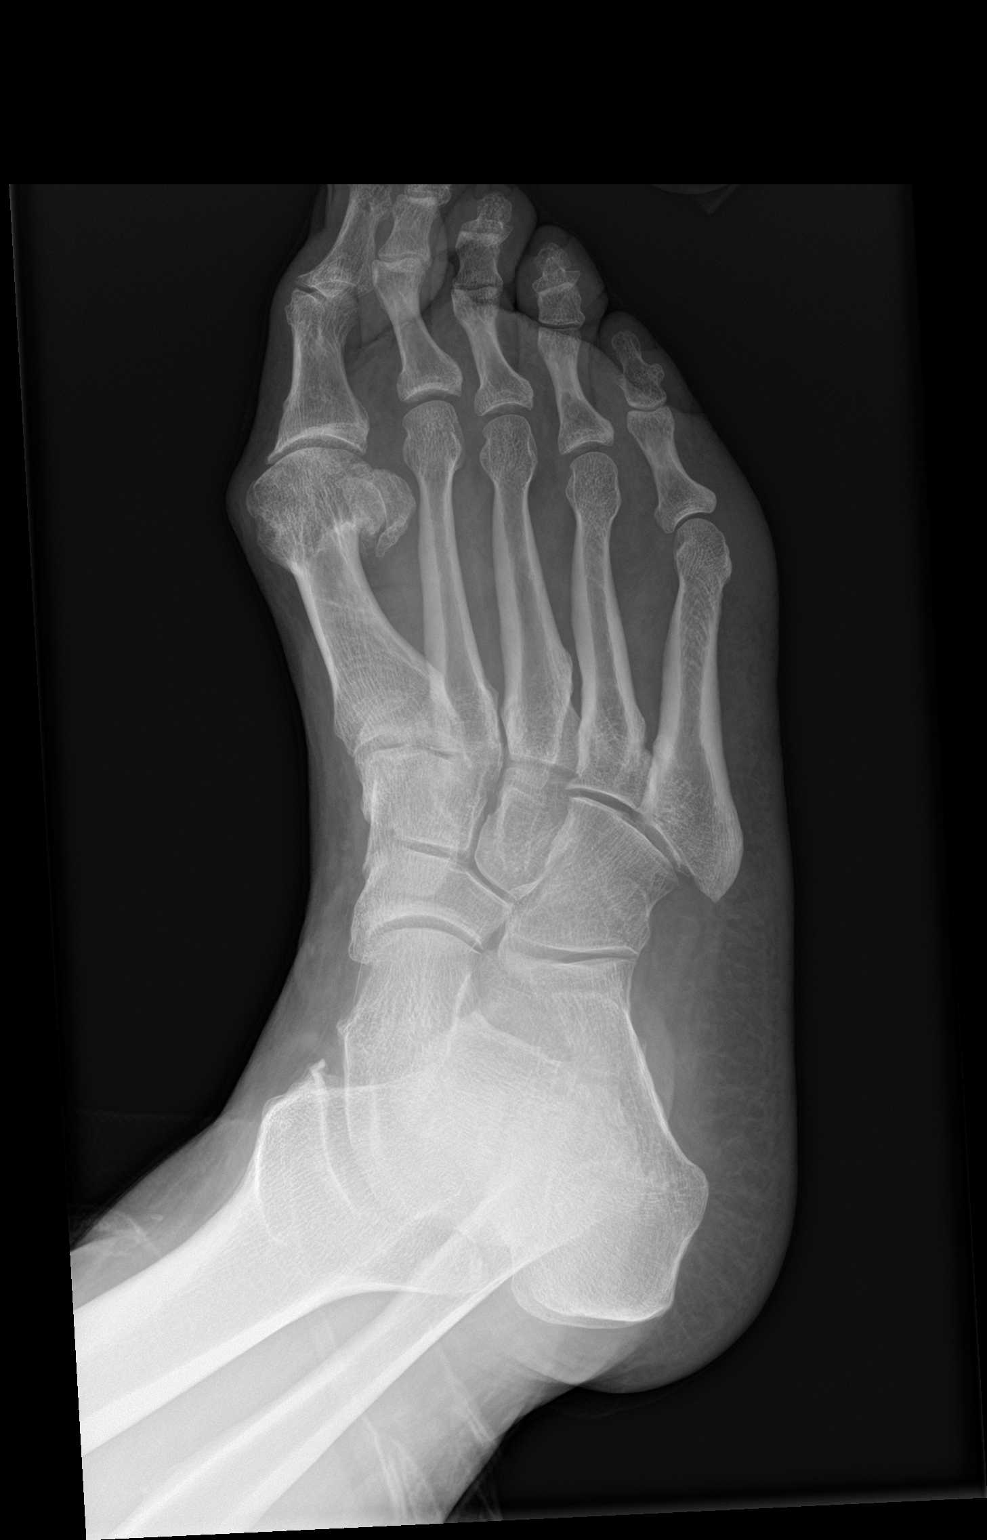

[foot lat]
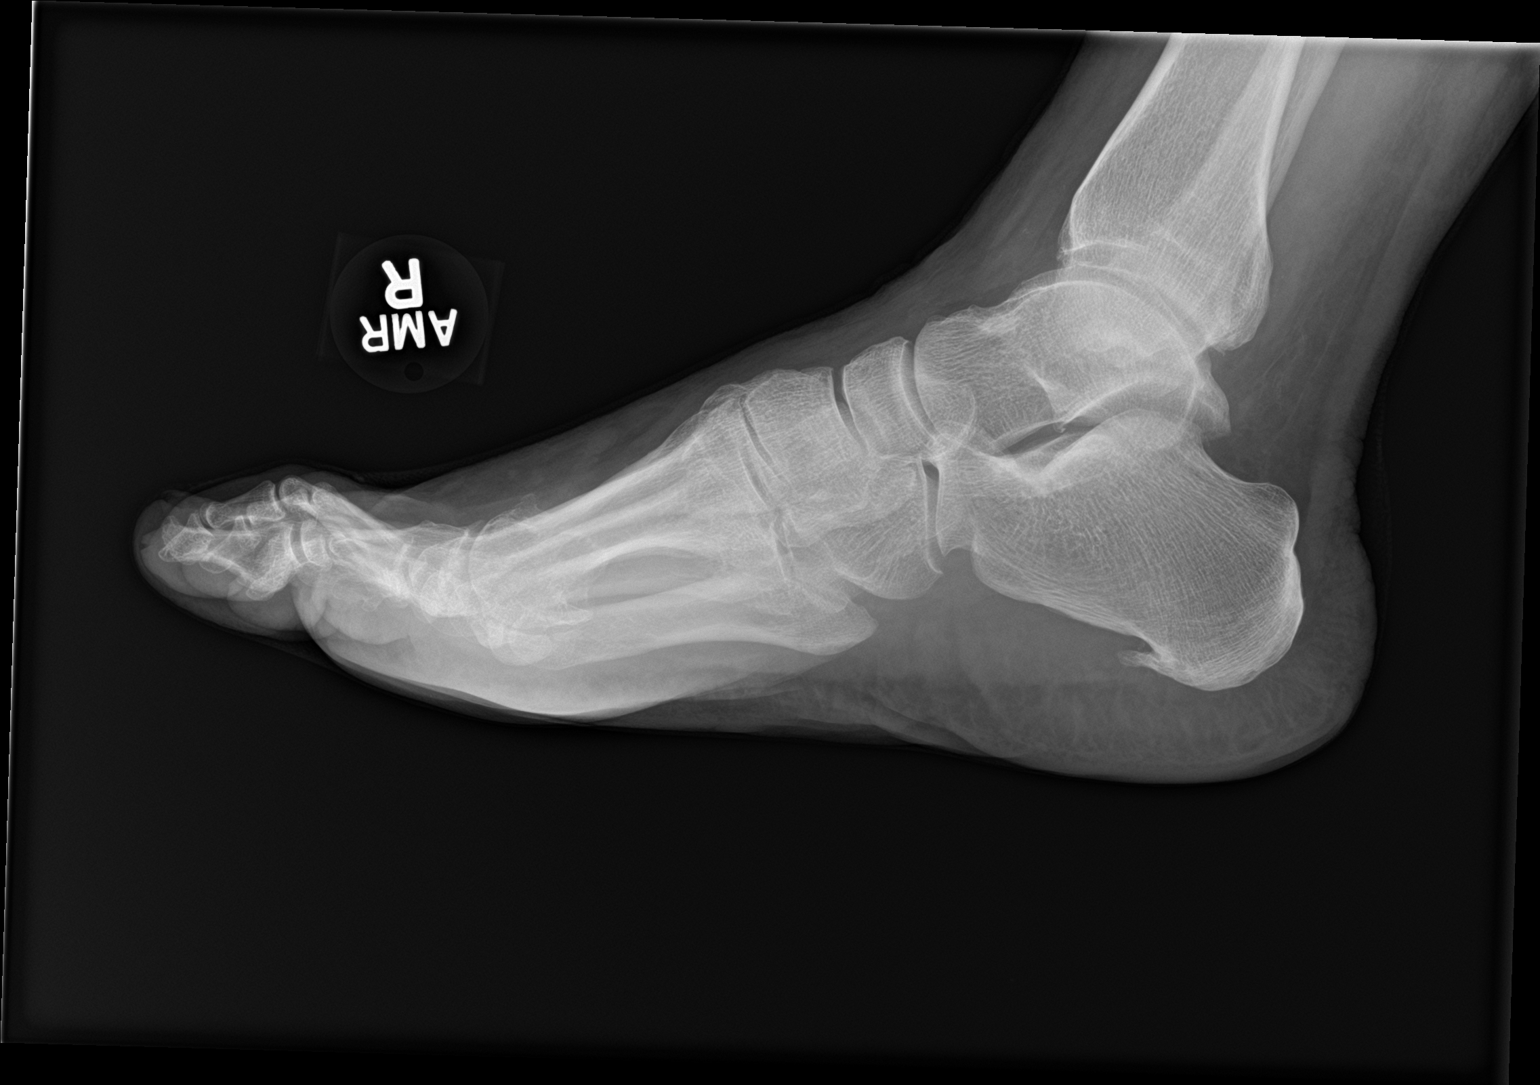

[3 of 3 positions shown; findings below may reference images not displayed]

FINDINGS: Prominent degenerative change first MTP. No acute or focal bony
abnormality. No evidence of fracture or dislocation.
IMPRESSION: Prominent degenerative changes first MTP.  No acute abnormality.

## 2021-09-14 ENCOUNTER — Ambulatory Visit (INDEPENDENT_AMBULATORY_CARE_PROVIDER_SITE_OTHER): Payer: No Typology Code available for payment source | Admitting: Family Medicine

## 2021-09-14 ENCOUNTER — Other Ambulatory Visit: Payer: Self-pay

## 2021-09-14 ENCOUNTER — Encounter: Payer: Self-pay | Admitting: Family Medicine

## 2021-09-14 VITALS — BP 138/80 | HR 66 | Temp 97.7°F | Wt 199.4 lb

## 2021-09-14 DIAGNOSIS — E785 Hyperlipidemia, unspecified: Secondary | ICD-10-CM

## 2021-09-14 DIAGNOSIS — K219 Gastro-esophageal reflux disease without esophagitis: Secondary | ICD-10-CM | POA: Diagnosis not present

## 2021-09-14 DIAGNOSIS — R972 Elevated prostate specific antigen [PSA]: Secondary | ICD-10-CM | POA: Diagnosis not present

## 2021-09-14 DIAGNOSIS — C20 Malignant neoplasm of rectum: Secondary | ICD-10-CM | POA: Diagnosis not present

## 2021-09-14 DIAGNOSIS — I10 Essential (primary) hypertension: Secondary | ICD-10-CM | POA: Diagnosis not present

## 2021-09-14 DIAGNOSIS — R21 Rash and other nonspecific skin eruption: Secondary | ICD-10-CM | POA: Diagnosis not present

## 2021-09-14 DIAGNOSIS — Z125 Encounter for screening for malignant neoplasm of prostate: Secondary | ICD-10-CM

## 2021-09-14 DIAGNOSIS — R7309 Other abnormal glucose: Secondary | ICD-10-CM

## 2021-09-14 DIAGNOSIS — R748 Abnormal levels of other serum enzymes: Secondary | ICD-10-CM | POA: Diagnosis not present

## 2021-09-14 MED ORDER — CLOTRIMAZOLE 1 % EX CREA: 1.0000 "application " | TOPICAL_CREAM | Freq: Two times a day (BID) | CUTANEOUS | 0 refills | Status: DC

## 2021-09-14 MED ORDER — OMEPRAZOLE 40 MG PO CPDR: 40.0000 mg | DELAYED_RELEASE_CAPSULE | Freq: Every day | ORAL | 3 refills | Status: DC

## 2021-09-14 NOTE — Progress Notes (Signed)
Subjective:  Patient ID: [name], male    DOB: [birth year]  Age: 66 y.o. MRN: [id number]  CC: Chief Complaint  Patient presents with   Establish Care    Pt switched insurance and is needing new script for colostomy supplies.  Top of feet-look like ringworm(brought boot in November and not sure if that has anything to do with it)    HPI:  66 year old male with a history of rectal cancer (has colostomy), hypertension, GERD, hyperlipidemia presents for follow-up and to establish care with me.  Patient needs his colostomy supplies.  He has a paper copy of what he needs.  Patient reports an ongoing rash on the tops of his feet.  He states that he has had this since November.  He attributes this to his shoes and moisture as he is a former.  He states that his wife is concerned it is ringworm.  He would like me to examine this today.  Hypertension is stable.  BP fairly well controlled today.  He is on Norvasc and metoprolol.  Patient states that his GERD is well controlled as long as he has his medication.  He is compliant with omeprazole.  Has a history of hyperlipidemia.  Last LDL was 124 in April of last year.  Needs recheck.  He is on no pharmacotherapy at this time.  Patient states that he has ongoing joint pains particularly of the neck and left shoulder.  He has known arthritis.  Also reports difficulty with his hips.  He is status post hip replacement on the left.  Patient Active Problem List   Diagnosis Date Noted   Hyperlipidemia 09/14/2021   Rash of both feet 09/14/2021   Incarcerated incisional hernia s/p lap repair with mesh 04/11/2017 04/11/2017   Colostomy in place [facility])    Essential hypertension, benign 05/12/2014   Obesity (BMI 30-39.9) 03/24/2014   Rectal cancer, uT2uN0, s/p LAR/ileostomy 03/20/2014 colostomy Aug2015 12/25/2013   GERD (gastroesophageal reflux disease) 11/27/2013    Social Hx   Social History   Socioeconomic History   Marital status:  Married    Spouse name: Not on file   Number of children: Not on file   Years of education: Not on file   Highest education level: Not on file  Occupational History   Not on file  Tobacco Use   Smoking status: Former    Packs/day: 0.00    Years: 15.00    Pack years: 0.00    Types: Cigars, Cigarettes    Quit date: 09/10/2011    Years since quitting: 10.0   Smokeless tobacco: Never  Vaping Use   Vaping Use: Never used  Substance and Sexual Activity   Alcohol use: No    Comment: quit years ago   Drug use: No   Sexual activity: Not on file  Other Topics Concern   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   Social Determinants of Health   Financial Resource Strain: Low Risk    Difficulty of Paying Living Expenses: Not very hard  Food Insecurity: No Food Insecurity   Worried About Charity fundraiser in the Last Year: Never true   Ran Out of Food in the Last Year: Never true  Transportation Needs: No Transportation Needs   Lack of Transportation (Medical): No   Lack of Transportation (Non-Medical): No  Physical Activity: Sufficiently Active   Days of Exercise per Week: 5 days   Minutes of  Exercise per Session: 50 min  Stress: No Stress Concern Present   Feeling of Stress : Not at all  Social Connections: Moderately Integrated   Frequency of Communication with Friends and Family: More than three times a week   Frequency of Social Gatherings with Friends and Family: More than three times a week   Attends Religious Services: More than 4 times per year   Active Member of [employer] or Organizations: No   Attends Music therapist: Never   Marital Status: Married    Review of Systems Per HPI  Objective:  BP 138/80    Pulse 66    Temp 97.7 F (36.5 C)    Wt 199 lb 6.4 oz (90.4 kg)    SpO2 94%    BMI 33.18 kg/m   BP/Weight 09/14/2021 4/35/6861 01/27/3728  Systolic BP 021 115 520  Diastolic BP 80 90 90  Wt. (Lbs) 199.4 196.2 -   BMI 33.18 32.65 -    Physical Exam Vitals and nursing note reviewed.  Constitutional:      General: He is not in acute distress.    Appearance: Normal appearance. He is obese.  HENT:     Head: Normocephalic and atraumatic.  Eyes:     General:        Right eye: No discharge.        Left eye: No discharge.     Conjunctiva/sclera: Conjunctivae normal.  Cardiovascular:     Rate and Rhythm: Normal rate and regular rhythm.     Heart sounds: No murmur heard. Pulmonary:     Effort: Pulmonary effort is normal.     Breath sounds: Normal breath sounds. No wheezing, rhonchi or rales.  Abdominal:     General: There is no distension.     Palpations: Abdomen is soft.     Comments: Patient has 2 hernias.  Colostomy in place.  Musculoskeletal:     Comments: Dorsum of the feet with 2 well-circumscribed areas of mild erythema.  Neurological:     Mental Status: He is alert.  Psychiatric:        Mood and Affect: Mood normal.        Behavior: Behavior normal.    Lab Results  Component Value Date   WBC 7.5 04/26/2021   HGB 14.8 04/26/2021   HCT 44.8 04/26/2021   PLT 297 04/26/2021   GLUCOSE 109 (H) 04/26/2021   CHOL 183 11/19/2020   TRIG 124 11/19/2020   HDL 36 (L) 11/19/2020   LDLCALC 124 (H) 11/19/2020   ALT 25 04/26/2021   AST 18 04/26/2021   NA 136 04/26/2021   K 4.2 04/26/2021   CL 103 04/26/2021   CREATININE 0.75 04/26/2021   BUN 21 04/26/2021   CO2 27 04/26/2021   TSH 2.029 10/18/2018   INR 0.99 12/25/2013   HGBA1C 6.2 (H) 08/19/2014     Assessment & Plan:   Problem List Items Addressed This Visit       Cardiovascular and Mediastinum   Essential hypertension, benign - Primary (Chronic)    Stable.  Continue amlodipine and metoprolol.      Relevant Orders   CMP14+EGFR     Digestive   GERD (gastroesophageal reflux disease) (Chronic)    Stable on omeprazole.  Refilled today.      Relevant Medications   omeprazole (PRILOSEC) 40 MG capsule   Rectal cancer,  uT2uN0, s/p LAR/ileostomy 03/20/2014 colostomy [id number] (Chronic)    Doing well.  Follows with oncology.  Next colonoscopy  recommended in 2024.  We have sent in prescriptions for his colostomy supplies.      Relevant Orders   CBC   CMP14+EGFR     Musculoskeletal and Integument   Rash of both feet    Likely fungal in origin.  Treating with clotrimazole.        Other   Hyperlipidemia    Lipid panel ordered.      Relevant Orders   Lipid panel   Other Visit Diagnoses     Elevated glucose       Relevant Orders   Hemoglobin A1c   Screening for prostate cancer       Relevant Orders   PSA       Meds ordered this encounter  Medications   clotrimazole (LOTRIMIN) 1 % cream    Sig: Apply 1 application topically 2 (two) times daily.    Dispense:  60 g    Refill:  0   omeprazole (PRILOSEC) 40 MG capsule    Sig: Take 1 capsule (40 mg total) by mouth daily.    Dispense:  90 capsule    Refill:  3    Follow-up:  Return in about 6 months (around 03/14/2022).  [place]

## 2021-09-14 NOTE — Assessment & Plan Note (Signed)
Likely fungal in origin.  Treating with clotrimazole.

## 2021-09-14 NOTE — Patient Instructions (Signed)
We will take care of your supplies.  Use the medication as prescribed for the areas on your feet.  Labs today.  Follow up in 6 months.

## 2021-09-14 NOTE — Assessment & Plan Note (Signed)
Lipid panel ordered.

## 2021-09-14 NOTE — Assessment & Plan Note (Signed)
Stable.  Continue amlodipine and metoprolol.

## 2021-09-14 NOTE — Assessment & Plan Note (Signed)
Stable on omeprazole.  Refilled today.

## 2021-09-14 NOTE — Assessment & Plan Note (Signed)
Doing well.  Follows with oncology.  Next colonoscopy recommended in 2024.  We have sent in prescriptions for his colostomy supplies.

## 2021-09-15 LAB — HEMOGLOBIN A1C
Est. average glucose Bld gHb Est-mCnc: 123 mg/dL
Hgb A1c MFr Bld: 5.9 % — ABNORMAL HIGH (ref 4.8–5.6)

## 2021-09-15 LAB — CMP14+EGFR
ALT: 31 IU/L (ref 0–44)
AST: 18 IU/L (ref 0–40)
Albumin/Globulin Ratio: 1.5 (ref 1.2–2.2)
Albumin: 4 g/dL (ref 3.8–4.8)
Alkaline Phosphatase: 138 IU/L — ABNORMAL HIGH (ref 44–121)
BUN/Creatinine Ratio: 25 — ABNORMAL HIGH (ref 10–24)
BUN: 22 mg/dL (ref 8–27)
Bilirubin Total: 0.4 mg/dL (ref 0.0–1.2)
CO2: 25 mmol/L (ref 20–29)
Calcium: 9.6 mg/dL (ref 8.6–10.2)
Chloride: 100 mmol/L (ref 96–106)
Creatinine, Ser: 0.88 mg/dL (ref 0.76–1.27)
Globulin, Total: 2.7 g/dL (ref 1.5–4.5)
Glucose: 92 mg/dL (ref 70–99)
Potassium: 4.5 mmol/L (ref 3.5–5.2)
Sodium: 138 mmol/L (ref 134–144)
Total Protein: 6.7 g/dL (ref 6.0–8.5)
eGFR: 96 mL/min/{1.73_m2} (ref >59)

## 2021-09-15 LAB — CBC
Hematocrit: 44.3 % (ref 37.5–51.0)
Hemoglobin: 14.9 g/dL (ref 13.0–17.7)
MCH: 30.2 pg (ref 26.6–33.0)
MCHC: 33.6 g/dL (ref 31.5–35.7)
MCV: 90 fL (ref 79–97)
Platelets: 346 10*3/uL (ref 150–450)
RBC: 4.93 x10E6/uL (ref 4.14–5.80)
RDW: 12.4 % (ref 11.6–15.4)
WBC: 7.8 10*3/uL (ref 3.4–10.8)

## 2021-09-15 LAB — PSA: Prostate Specific Ag, Serum: 4.3 ng/mL — ABNORMAL HIGH (ref 0.0–4.0)

## 2021-09-15 LAB — LIPID PANEL
Chol/HDL Ratio: 5.2 ratio — ABNORMAL HIGH (ref 0.0–5.0)
Cholesterol, Total: 198 mg/dL (ref 100–199)
HDL: 38 mg/dL — ABNORMAL LOW (ref >39)
LDL Chol Calc (NIH): 131 mg/dL — ABNORMAL HIGH (ref 0–99)
Triglycerides: 164 mg/dL — ABNORMAL HIGH (ref 0–149)
VLDL Cholesterol Cal: 29 mg/dL (ref 5–40)

## 2021-09-19 MED ORDER — ROSUVASTATIN CALCIUM 10 MG PO TABS: 10.0000 mg | ORAL_TABLET | Freq: Every day | ORAL | 0 refills | Status: DC

## 2021-09-19 NOTE — Addendum Note (Signed)
Addended by: Madelin Rear on: 09/19/2021 04:04 PM   Modules accepted: Orders

## 2021-09-20 DIAGNOSIS — R748 Abnormal levels of other serum enzymes: Secondary | ICD-10-CM | POA: Diagnosis not present

## 2021-09-21 LAB — GAMMA GT: GGT: 59 IU/L (ref 0–65)

## 2021-10-10 ENCOUNTER — Ambulatory Visit (INDEPENDENT_AMBULATORY_CARE_PROVIDER_SITE_OTHER): Payer: No Typology Code available for payment source | Admitting: Urology

## 2021-10-10 ENCOUNTER — Ambulatory Visit (HOSPITAL_COMMUNITY)
Admission: RE | Admit: 2021-10-10 | Discharge: 2021-10-10 | Disposition: A | Discharge status: Home / Self Care | Payer: No Typology Code available for payment source | Source: Ambulatory Visit | Attending: Urology | Admitting: Urology

## 2021-10-10 ENCOUNTER — Encounter: Payer: Self-pay | Admitting: Urology

## 2021-10-10 ENCOUNTER — Other Ambulatory Visit: Payer: Self-pay

## 2021-10-10 VITALS — BP 143/93 | HR 69 | Wt 195.0 lb

## 2021-10-10 DIAGNOSIS — N200 Calculus of kidney: Secondary | ICD-10-CM

## 2021-10-10 DIAGNOSIS — N138 Other obstructive and reflux uropathy: Secondary | ICD-10-CM | POA: Diagnosis not present

## 2021-10-10 DIAGNOSIS — R972 Elevated prostate specific antigen [PSA]: Secondary | ICD-10-CM

## 2021-10-10 DIAGNOSIS — R350 Frequency of micturition: Secondary | ICD-10-CM | POA: Diagnosis not present

## 2021-10-10 DIAGNOSIS — N401 Enlarged prostate with lower urinary tract symptoms: Secondary | ICD-10-CM

## 2021-10-10 LAB — URINALYSIS, ROUTINE W REFLEX MICROSCOPIC
Bilirubin, UA: NEGATIVE
Glucose, UA: NEGATIVE
Ketones, UA: NEGATIVE
Leukocytes,UA: NEGATIVE
Nitrite, UA: NEGATIVE
Protein,UA: NEGATIVE
RBC, UA: NEGATIVE
Specific Gravity, UA: 1.025 (ref 1.005–1.030)
Urobilinogen, Ur: 0.2 mg/dL (ref 0.2–1.0)
pH, UA: 5.5 (ref 5.0–7.5)

## 2021-10-10 MED ORDER — TADALAFIL 5 MG PO TABS: 5.0000 mg | ORAL_TABLET | Freq: Every day | ORAL | 11 refills | Status: DC

## 2021-10-10 NOTE — Progress Notes (Signed)
10/10/2021 9:15 AM   [name] [birth year] [id number]  Referring provider: Coral Spikes, DO [place],   [id number]  CC: Elevated PSA  HPI:  New patient-  1) PSA elevation-patient with a PSA of 2.1 in 2018 which rose to 4.3 in January 2023.  He does have a h/o BPH. No prior biopsy. He had a left hip replacement (scatter imaging signal). No rectal access from prior low colon resection.    2) BPH - He did undergo CT scan October 2021 (history of colon cancer, colostomy) which revealed about a 40 g prostate. He had a distal colectomy/colostomy in 2015 for colon ca and saw Dr. Matilde Sprang for urinary retention which resolved. Since the has a weak stream, intermittent stream. He he has frequency. No incontinence. AUASS = 23. He read about "urolift". Looking back he has had urgency for many years. UA clear.    3) kidney stone-patient underwent CT scan October 2021 due to history of colon cancer, colostomy which revealed a 4 mm right lower pole stone. No h/o of stones or stone passage.    4) ED - he does not get erection for several years. Tried sildenafil but yrs ago. Declined VED. He has a good libido.   Today, seen for the above.  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   PMH: Past Medical History:  Diagnosis Date   Arthritis    Aspiration pneumonia ([place]) 03/24/2014   Colostomy in place [facility])    Difficulty urinating    GERD (gastroesophageal reflux disease)    Hypertension    Ileostomy present ([place])    Rectal cancer, uT2uN0, s/p LAR/ileostomy 03/20/2014 colostomy Aug2015 12/25/2013   TCS MAY 2015    Shortening, leg, congenital    Left   Skin irritation    Surrounding ostomy site    Surgical History: Past Surgical History:  Procedure Laterality Date   COLON RESECTION N/A 03/24/2014   Procedure: Diagnostic  laparotomy, takedown of coloanal anastamosis, end colostomy, with wound vac;  Surgeon: Adin Hector, MD;  [facility];  Service: General;  Laterality: N/A;   COLONOSCOPY N/A 12/22/2013   SLF: 1. Normal mucosa in the terminal ilium. 2. Moderate diverticulosis in the transverse colon, descending colon, and sigmoid colon. 3. Rectal bleeding due ot rectal mass and hemorrhoids 4. Moderate sized internal hemorrhoids.    COLONOSCOPY N/A 01/19/2015   Procedure: COLONOSCOPY;  Surgeon: Danie Binder, MD;  [facility];  Service: Endoscopy;  Laterality: N/A;  1215   COLONOSCOPY N/A 03/22/2018   Procedure: COLONOSCOPY;  Surgeon: Danie Binder, MD;  [facility];  Service: Endoscopy;  Laterality: N/A;  2:15pm   COLOSTOMY     ESOPHAGOGASTRODUODENOSCOPY N/A 12/22/2013   SLF: Probable Candia esophagitis2. Probable Barretts esphagus 3. Mild gastritis.    EUS N/A 01/01/2014   Procedure: LOWER ENDOSCOPIC ULTRASOUND (EUS);  Surgeon: Milus Banister, MD;  Location: [facility];  Service: Endoscopy;  Laterality: N/A;   EXAMINATION UNDER ANESTHESIA  08/25/2014   Procedure: EXAM UNDER ANESTHESIA;  Surgeon: Michael Boston, MD;  [facility];  Service: General;;   eye reattachment and ear reattachment  Right [place] N/A 03/24/2014   Procedure: FLEXIBLE SIGMOIDOSCOPY;  Surgeon: Adin Hector, MD;  [facility];  Service: General;  Laterality: N/A;   HIP FRACTURE SURGERY Left age 10 on 04[birth year]06[birth year]1970   ILEOSTOMY     ILEOSTOMY CLOSURE N/A  08/25/2014   Procedure: TAKEDOWN OF LOOP ILEOSTOMY ;  Surgeon: Michael Boston, MD;  [facility];  Service: General;  Laterality: N/A;   INSERTION OF MESH N/A 04/11/2017   Procedure: INSERTION OF MESH ;  Surgeon: Michael Boston, MD;  [facility];  Service: General;  Laterality: N/A;  BILATERAL TAP BLOCK   TOTAL HIP ARTHROPLASTY Left 11/19/2015   Procedure: LEFT TOTAL HIP ARTHROPLASTY ANTERIOR APPROACH;  Surgeon: Mcarthur Rossetti, MD;  [facility];  Service: Orthopedics;  Laterality: Left;   VENTRAL HERNIA REPAIR N/A 04/11/2017   Procedure:  LAPAROSCOPIC INCARCERATED PERISTOMAL AND INCARCERATED INCISIONAL HERNIA X2;  Surgeon: Michael Boston, MD;  [facility];  Service: General;  Laterality: N/A;  BILATERAL TAP BLOCK    Home Medications:  Allergies as of 10/10/2021   No Known Allergies      Medication List        Accurate as of October 11, 2063  9:15 AM. If you have any questions, ask your nurse or doctor.          amLODipine 5 MG tablet Commonly known as: NORVASC Take 1 tablet (5 mg total) by mouth daily.   aspirin EC 81 MG tablet Take 81 mg by mouth daily.   baclofen 10 MG tablet Commonly known as: LIORESAL Take 1 tablet (10 mg total) by mouth at bedtime. For leg cramping   clotrimazole 1 % cream Commonly known as: LOTRIMIN Apply 1 application topically 2 (two) times daily.   co-enzyme Q-10 30 MG capsule Take 30 mg by mouth daily.   LUTEIN PO Take 1 tablet by mouth 2 (two) times daily.   metoprolol tartrate 25 MG tablet Commonly known as: LOPRESSOR Take 1 tablet (25 mg total) by mouth 2 (two) times daily.   omeprazole 40 MG capsule Commonly known as: PRILOSEC Take 1 capsule (40 mg total) by mouth daily.   ONE DAILY FOR MEN 50+ ADVANCED PO Take 1 tablet by mouth daily.   OVER THE COUNTER MEDICATION Take 1 tablet by mouth 2 (two) times daily. Vinegar tablet by mouth twice a day   rosuvastatin 10 MG tablet Commonly known as: Crestor Take 1 tablet (10 mg total) by mouth daily.   saw palmetto 500 MG capsule Take 500 mg by mouth 2 (two) times daily.   Turmeric 500 MG Caps Take 500 mg by mouth 2 (two) times daily.   vitamin A 25000 UNIT capsule Take 25,000 Units by mouth daily.   vitamin B-12 500 MCG tablet Commonly known as: CYANOCOBALAMIN Take 1,000 mcg by mouth daily.   vitamin C 500 MG tablet Commonly known as: ASCORBIC ACID Take 500 mg by mouth 2 (two) times daily.        Allergies: No Known Allergies  Family History: Family History  Problem Relation Age of Onset    Colon cancer Sister    Emphysema Mother    Heart attack Brother    Colon polyps Neg Hx     Social History:  reports that he quit smoking about 10 years ago. His smoking use included cigars and cigarettes. He has never used smokeless tobacco. He reports that he does not drink alcohol and does not use drugs.   Physical Exam: BP (!) 143/93    Pulse 69    Wt 195 lb (88.5 kg)    BMI 32.45 kg/m   Constitutional:  Alert and oriented, No acute distress. HEENT:  AT, moist mucus membranes.  Trachea midline, no masses. Cardiovascular: No clubbing, cyanosis,  or edema. Respiratory: Normal respiratory effort, no increased work of breathing. GI: Abdomen is soft, nontender, nondistended, no abdominal masses GU: No CVA tenderness Lymph: No cervical or inguinal lymphadenopathy. Skin: No rashes, bruises or suspicious lesions. Neurologic: Grossly intact, no focal deficits, moving all 4 extremities. Psychiatric: Normal mood and affect. GU: GU: Penis circumcised, normal foreskin, testicles descended bilaterally and palpably normal, bilateral epididymis palpably normal, scrotum normal DRE: No RECTAL access - likely a very low resection or stricture (pt recalls the stump may have been ischemic).    Laboratory Data: Lab Results  Component Value Date   WBC 7.8 09/14/2021   HGB 14.9 09/14/2021   HCT 44.3 09/14/2021   MCV 90 09/14/2021   PLT 346 09/14/2021    Lab Results  Component Value Date   CREATININE 0.88 09/14/2021    No results found for: PSA  No results found for: TESTOSTERONE  Lab Results  Component Value Date   HGBA1C 5.9 (H) 09/14/2021    Urinalysis    Component Value Date/Time   COLORURINE YELLOW 04/05/2014 0506   APPEARANCEUR CLEAR 04/05/2014 0506   LABSPEC 1.025 04/05/2014 0506   PHURINE 6.5 04/05/2014 0506   GLUCOSEU NEGATIVE 04/05/2014 0506   HGBUR NEGATIVE 04/05/2014 0506   BILIRUBINUR NEGATIVE 04/05/2014 0506   KETONESUR NEGATIVE 04/05/2014 0506   PROTEINUR  NEGATIVE 04/05/2014 0506   UROBILINOGEN 0.2 04/05/2014 0506   NITRITE NEGATIVE 04/05/2014 0506   LEUKOCYTESUR NEGATIVE 04/05/2014 0506    No results found for: LABMICR, WBCUA, RBCUA, LABEPIT, MUCUS, BACTERIA  Pertinent Imaging: CT 2021    Assessment & Plan:    1. Elevated PSA I had a long discussion with the patient on the nature of elevated PSA - benign vs malignant causes. We discussed age specific levels and that PCa can be seen on a biopsy with very low PSA levels (<=2.5). We discussed the nature risks and benefits of continued surveillance, other lab tests, imaging as well as prostate biopsy. We discussed the management of prostate cancer might include active surveillance or treatment depending on biopsy findings. All questions answered.   2. BPH - disc nature r/b of alpha blocker or daiy pde5i - start tadalafil.   3. ED - discussed the nature r/b/a to pde5i and injections.   4> kidney stone - check KUB   - Urinalysis, Routine w reflex microscopic   No follow-ups on file.  Festus Aloe, MD  [facility]  [address] [place], [place] [address] [phone]

## 2021-10-20 ENCOUNTER — Other Ambulatory Visit: Payer: Self-pay | Admitting: Family Medicine

## 2021-11-16 ENCOUNTER — Other Ambulatory Visit: Payer: Self-pay | Admitting: Family Medicine

## 2021-11-16 DIAGNOSIS — M79671 Pain in right foot: Secondary | ICD-10-CM

## 2021-12-08 ENCOUNTER — Other Ambulatory Visit: Payer: Self-pay

## 2021-12-08 MED ORDER — ROSUVASTATIN CALCIUM 10 MG PO TABS: 10.0000 mg | ORAL_TABLET | Freq: Every day | ORAL | 0 refills | Status: DC

## 2021-12-09 ENCOUNTER — Ambulatory Visit: Payer: No Typology Code available for payment source | Admitting: Urology

## 2021-12-12 ENCOUNTER — Other Ambulatory Visit: Payer: No Typology Code available for payment source

## 2021-12-12 ENCOUNTER — Other Ambulatory Visit: Payer: Self-pay

## 2021-12-12 DIAGNOSIS — R972 Elevated prostate specific antigen [PSA]: Secondary | ICD-10-CM

## 2021-12-13 LAB — PSA: Prostate Specific Ag, Serum: 3.3 ng/mL (ref 0.0–4.0)

## 2021-12-19 ENCOUNTER — Ambulatory Visit (INDEPENDENT_AMBULATORY_CARE_PROVIDER_SITE_OTHER): Payer: No Typology Code available for payment source | Admitting: Urology

## 2021-12-19 ENCOUNTER — Encounter: Payer: Self-pay | Admitting: Urology

## 2021-12-19 VITALS — BP 155/86 | HR 76

## 2021-12-19 DIAGNOSIS — R3912 Poor urinary stream: Secondary | ICD-10-CM | POA: Diagnosis not present

## 2021-12-19 DIAGNOSIS — N200 Calculus of kidney: Secondary | ICD-10-CM

## 2021-12-19 DIAGNOSIS — N401 Enlarged prostate with lower urinary tract symptoms: Secondary | ICD-10-CM | POA: Diagnosis not present

## 2021-12-19 DIAGNOSIS — R972 Elevated prostate specific antigen [PSA]: Secondary | ICD-10-CM | POA: Diagnosis not present

## 2021-12-19 LAB — URINALYSIS, ROUTINE W REFLEX MICROSCOPIC
Bilirubin, UA: NEGATIVE
Glucose, UA: NEGATIVE
Ketones, UA: NEGATIVE
Leukocytes,UA: NEGATIVE
Nitrite, UA: NEGATIVE
Protein,UA: NEGATIVE
RBC, UA: NEGATIVE
Specific Gravity, UA: 1.025 (ref 1.005–1.030)
Urobilinogen, Ur: 0.2 mg/dL (ref 0.2–1.0)
pH, UA: 6 (ref 5.0–7.5)

## 2021-12-19 LAB — BLADDER SCAN AMB NON-IMAGING: Scan Result: 10

## 2021-12-19 MED ORDER — TAMSULOSIN HCL 0.4 MG PO CAPS: 0.4000 mg | ORAL_CAPSULE | Freq: Every day | ORAL | 11 refills | Status: DC

## 2021-12-19 NOTE — Progress Notes (Signed)
? ?12/19/2021 ?9:09 AM  ? ?[place] ?1955/09/25 ?[id number] ? ?Referring provider: Coral Spikes, DO ?[address] ?[address] ?[place],  [place] [address] ? ?No chief complaint on file. ? ? ?HPI: ? ?F/u -  ?  ?1) PSA elevation-patient with a PSA of 2.1 in 2018 which rose to 4.3 in January 2023. No prior biopsy. He had a left hip replacement (scatter imaging signal). No rectal access from prior LAR/ileostomy in 2015 then a colostomy (h/o colon ca).   ?  ?2) BPH - CT October 2021 revealed about a 40 g prostate. He saw Dr. Matilde Sprang for urinary retention p above pelvic surgery which resolved. Since then has a weak stream, intermittent stream. He he has frequency. No incontinence. AUASS = 23. He read about "urolift". Looking back he has had urgency for many years. UA clear.  ?  ?3) kidney stone - CT October 2021 revealed a 4 mm right lower pole stone. No h/o of stones or stone passage. Feb 2023 KUB with a 9 mm RLP stone.  ?   ?4) ED - he does not get erection for several years. Tried sildenafil but yrs ago. Declined VED. He has a good libido.  ?  ?Today, seen for the above. He started daily tadalafil in Feb 2023 with no change in LUTS and stopped it. Wk st and nocturia. Intermittent flow. AUASS = 26. Also, no erection. His April 2023 PSA normalized to 3.3. No flank pain or stone passage. PVR is 10 ml. UA clear.  ?  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  ? ? ?PMH: ?Past Medical History:  ?Diagnosis Date  ? Arthritis   ? Aspiration pneumonia ([place]) 03/24/2014  ? Colostomy in place [facility])   ? Difficulty urinating   ? GERD (gastroesophageal reflux disease)   ? Hypertension   ? Ileostomy present ([place])   ? Rectal cancer, uT2uN0, s/p LAR/ileostomy 03/20/2014 colostomy Aug2015 12/25/2013  ? TCS MAY 2015   ? Shortening, leg, congenital   ? Left  ? Skin irritation   ? Surrounding ostomy site  ? ? ?Surgical History: ?Past Surgical History:  ?Procedure Laterality Date  ? COLON RESECTION N/A 03/24/2014  ? Procedure:  Diagnostic  laparotomy, takedown of coloanal anastamosis, end colostomy, with wound vac;  Surgeon: Adin Hector, MD;  [facility];  Service: General;  Laterality: N/A;  ? COLONOSCOPY N/A 12/22/2013  ? SLF: 1. Normal mucosa in the terminal ilium. 2. Moderate diverticulosis in the transverse colon, descending colon, and sigmoid colon. 3. Rectal bleeding due ot rectal mass and hemorrhoids 4. Moderate sized internal hemorrhoids.   ? COLONOSCOPY N/A 01/19/2015  ? Procedure: COLONOSCOPY;  Surgeon: Danie Binder, MD;  [facility];  Service: Endoscopy;  Laterality: N/A;  1215  ? COLONOSCOPY N/A 03/22/2018  ? Procedure: COLONOSCOPY;  Surgeon: Danie Binder, MD;  [facility];  Service: Endoscopy;  Laterality: N/A;  2:15pm  ? COLOSTOMY    ? ESOPHAGOGASTRODUODENOSCOPY N/A 12/22/2013  ? SLF: Probable Candia esophagitis2. Probable Barretts esphagus 3. Mild gastritis.   ? EUS N/A 01/01/2014  ? Procedure: LOWER ENDOSCOPIC ULTRASOUND (EUS);  Surgeon: Milus Banister, MD;  Location[facility];  Service: Endoscopy;  Laterality: N/A;  ? EXAMINATION UNDER ANESTHESIA  08/25/2014  ? Procedure: EXAM UNDER ANESTHESIA;  Surgeon: Michael Boston, MD;  [facility];  Service: General;;  ? eye reattachment and ear reattachment  Right 1971  ? FLEXIBLE SIGMOIDOSCOPY N/A 03/24/2014  ?  Procedure: FLEXIBLE SIGMOIDOSCOPY;  Surgeon: Adin Hector, MD;  [facility];  Service: General;  Laterality: N/A;  ? HIP FRACTURE SURGERY Left age 48 on [birth year]06[birth year]1970  ? ILEOSTOMY    ? ILEOSTOMY CLOSURE N/A 08/25/2014  ? Procedure: TAKEDOWN OF LOOP ILEOSTOMY ;  Surgeon: Michael Boston, MD;  [facility];  Service: General;  Laterality: N/A;  ? INSERTION OF MESH N/A 04/11/2017  ? Procedure: INSERTION OF MESH ;  Surgeon: Michael Boston, MD;  [facility];  Service: General;  Laterality: N/A;  BILATERAL TAP BLOCK  ? TOTAL HIP ARTHROPLASTY Left 11/19/2015  ? Procedure: LEFT TOTAL HIP ARTHROPLASTY ANTERIOR APPROACH;  Surgeon: Mcarthur Rossetti, MD;  [facility];  Service: Orthopedics;  Laterality: Left;  ? VENTRAL HERNIA REPAIR N/A 04/11/2017  ? Procedure: LAPAROSCOPIC INCARCERATED PERISTOMAL AND INCARCERATED INCISIONAL HERNIA X2;  Surgeon: Michael Boston, MD;  [facility];  Service: General;  Laterality: N/A;  BILATERAL TAP BLOCK  ? ? ?Home Medications:  ?Allergies as of 12/19/2021   ?No Known Allergies ?  ? ?  ?Medication List  ?  ? ?  ? Accurate as of Dec 19, 2021  9:09 AM. If you have any questions, ask your nurse or doctor.  ?  ?  ? ?  ? ?amLODipine 5 MG tablet ?Commonly known as: NORVASC ?Take 1 tablet (5 mg total) by mouth daily. ?  ?aspirin EC 81 MG tablet ?Take 81 mg by mouth daily. ?  ?baclofen 10 MG tablet ?Commonly known as: LIORESAL ?TAKE 1 TABLET (10 MG TOTAL) BY MOUTH AT BEDTIME. FOR LEG CRAMPING ?  ?clotrimazole 1 % cream ?Commonly known as: LOTRIMIN ?Apply 1 application topically 2 (two) times daily. ?  ?co-enzyme Q-10 30 MG capsule ?Take 30 mg by mouth daily. ?  ?LUTEIN PO ?Take 1 tablet by mouth 2 (two) times daily. ?  ?metoprolol tartrate 25 MG tablet ?Commonly known as: LOPRESSOR ?Take 1 tablet (25 mg total) by mouth 2 (two) times daily. ?  ?omeprazole 40 MG capsule ?Commonly known as: PRILOSEC ?Take 1 capsule (40 mg total) by mouth daily. ?  ?ONE DAILY FOR MEN 50+ ADVANCED PO ?Take 1 tablet by mouth daily. ?  ?OVER THE COUNTER MEDICATION ?Take 1 tablet by mouth 2 (two) times daily. Vinegar tablet by mouth twice a day ?  ?rosuvastatin 10 MG tablet ?Commonly known as: Crestor ?Take 1 tablet (10 mg total) by mouth daily. ?  ?saw palmetto 500 MG capsule ?Take 500 mg by mouth 2 (two) times daily. ?  ?tadalafil 5 MG tablet ?Commonly known as: CIALIS ?Take 1 tablet (5 mg total) by mouth daily. ?  ?Turmeric 500 MG Caps ?Take 500 mg by mouth 2 (two) times daily. ?  ?vitamin A 25000 UNIT capsule ?Take 25,000 Units by mouth daily. ?  ?vitamin B-12 500 MCG tablet ?Commonly known as: CYANOCOBALAMIN ?Take 1,000 mcg by mouth daily. ?   ?vitamin C 500 MG tablet ?Commonly known as: ASCORBIC ACID ?Take 500 mg by mouth 2 (two) times daily. ?  ? ?  ? ? ?Allergies: No Known Allergies ? ?Family History: ?Family History  ?Problem Relation Age of Onset  ? Colon cancer Sister   ? Emphysema Mother   ? Heart attack Brother   ? Colon polyps Neg Hx   ? ? ?Social History:  reports that he quit smoking about 10 years ago. His smoking use included cigars and cigarettes. He has never used smokeless tobacco. He reports that he does not  drink alcohol and does not use drugs. ? ? ?Physical Exam: ?BP (!) 155/86   Pulse 76   ?Constitutional:  Alert and oriented, No acute distress. ?HEENT: [place] AT, moist mucus membranes.  Trachea midline, no masses. ?Cardiovascular: No clubbing, cyanosis, or edema. ?Respiratory: Normal respiratory effort, no increased work of breathing. ?GI: Abdomen is soft, nontender, nondistended, no abdominal masses ?GU: No CVA tenderness ?Skin: No rashes, bruises or suspicious lesions. ?Neurologic: Grossly intact, no focal deficits, moving all 4 extremities. ?Psychiatric: Normal mood and affect. ? ?Laboratory Data: ?Lab Results  ?Component Value Date  ? WBC 7.8 09/14/2021  ? HGB 14.9 09/14/2021  ? HCT 44.3 09/14/2021  ? MCV 90 09/14/2021  ? PLT 346 09/14/2021  ? ? ?Lab Results  ?Component Value Date  ? CREATININE 0.88 09/14/2021  ? ? ?No results found for: PSA ? ?No results found for: TESTOSTERONE ? ?Lab Results  ?Component Value Date  ? HGBA1C 5.9 (H) 09/14/2021  ? ? ?Urinalysis ?   ?Component Value Date/Time  ? [place] YELLOW 04/05/2014 0506  ? APPEARANCEUR Clear 10/10/2021 0915  ? LABSPEC 1.025 04/05/2014 0506  ? PHURINE 6.5 04/05/2014 0506  ? GLUCOSEU Negative 10/10/2021 0915  ? [place] NEGATIVE 04/05/2014 0506  ? BILIRUBINUR Negative 10/10/2021 0915  ? [place] NEGATIVE 04/05/2014 0506  ? PROTEINUR Negative 10/10/2021 0915  ? [place] NEGATIVE 04/05/2014 0506  ? UROBILINOGEN 0.2 04/05/2014 0506  ? NITRITE Negative 10/10/2021 0915  ? NITRITE  NEGATIVE 04/05/2014 0506  ? LEUKOCYTESUR Negative 10/10/2021 0915  ? ? ?Lab Results  ?Component Value Date  ? LABMICR Comment 10/10/2021  ? ? ?Pertinent Imaging: ? ?Results for orders placed during the hos

## 2021-12-19 NOTE — Progress Notes (Signed)
post void residual=10 

## 2022-01-15 ENCOUNTER — Other Ambulatory Visit: Payer: Self-pay | Admitting: Family Medicine

## 2022-01-15 DIAGNOSIS — I10 Essential (primary) hypertension: Secondary | ICD-10-CM

## 2022-01-26 ENCOUNTER — Other Ambulatory Visit: Payer: Self-pay | Admitting: Family Medicine

## 2022-01-26 DIAGNOSIS — I10 Essential (primary) hypertension: Secondary | ICD-10-CM

## 2022-01-27 ENCOUNTER — Other Ambulatory Visit: Payer: Self-pay

## 2022-01-27 DIAGNOSIS — I10 Essential (primary) hypertension: Secondary | ICD-10-CM

## 2022-01-27 MED ORDER — METOPROLOL TARTRATE 25 MG PO TABS: 25.0000 mg | ORAL_TABLET | Freq: Two times a day (BID) | ORAL | 0 refills | Status: DC

## 2022-02-01 ENCOUNTER — Telehealth: Payer: Self-pay | Admitting: Family Medicine

## 2022-02-01 NOTE — Telephone Encounter (Signed)
Left message for patient to call back and schedule Medicare Annual Wellness Visit (AWV) in office.   If unable to come into the office for AWV,  please offer to do virtually or by telephone.  Last AWV: 02/01/2021  Please schedule at anytime with [facility]-Nurse Health Advisor.  30 minute appointment for Virtual or phone  45 minute appointment for in office or Initial virtual/phone  Any questions, please contact me at [phone]      

## 2022-02-06 ENCOUNTER — Ambulatory Visit (INDEPENDENT_AMBULATORY_CARE_PROVIDER_SITE_OTHER): Payer: No Typology Code available for payment source | Admitting: Urology

## 2022-02-06 ENCOUNTER — Encounter: Payer: Self-pay | Admitting: Urology

## 2022-02-06 VITALS — BP 132/95 | HR 80 | Ht 65.0 in | Wt 195.0 lb

## 2022-02-06 DIAGNOSIS — N200 Calculus of kidney: Secondary | ICD-10-CM | POA: Diagnosis not present

## 2022-02-06 DIAGNOSIS — N5239 Other post-surgical erectile dysfunction: Secondary | ICD-10-CM

## 2022-02-06 DIAGNOSIS — N401 Enlarged prostate with lower urinary tract symptoms: Secondary | ICD-10-CM | POA: Diagnosis not present

## 2022-02-06 DIAGNOSIS — R3912 Poor urinary stream: Secondary | ICD-10-CM | POA: Diagnosis not present

## 2022-02-06 LAB — URINALYSIS, ROUTINE W REFLEX MICROSCOPIC
Bilirubin, UA: NEGATIVE
Glucose, UA: NEGATIVE
Ketones, UA: NEGATIVE
Leukocytes,UA: NEGATIVE
Nitrite, UA: NEGATIVE
Protein,UA: NEGATIVE
RBC, UA: NEGATIVE
Specific Gravity, UA: 1.025 (ref 1.005–1.030)
Urobilinogen, Ur: 0.2 mg/dL (ref 0.2–1.0)
pH, UA: 5.5 (ref 5.0–7.5)

## 2022-02-06 MED ORDER — CIPROFLOXACIN HCL 500 MG PO TABS
500.0000 mg | ORAL_TABLET | Freq: Once | ORAL | Status: AC
  Administered 2022-02-06: 500 mg via ORAL

## 2022-02-06 NOTE — Progress Notes (Signed)
02/06/22  CC: No chief complaint on file.   HPI:  F/u -    1) BPH - CT October 2021 revealed about a 40 g prostate. He saw Dr. Matilde Sprang for urinary retention p above pelvic surgery which resolved. Since then has a weak stream, intermittent stream. He he has frequency. No incontinence. AUASS = 23. He read about "urolift". Looking back he has had urgency for many years. UA clear.   He started daily tadalafil in Feb 2023 with no change in LUTS and stopped it. He has weak st and nocturia. Intermittent flow. AUASS = 26. PVR is 10 ml. UA clear.    2) kidney stone - CT October 2021 revealed a 4 mm right lower pole stone. No h/o of stones or stone passage. Feb 2023 KUB with a 9 mm RLP stone.     3) ED - he does not get erection for several years. Tried sildenafil but yrs ago. Declined VED. He has a good libido. Daily tadalafil did not help.    4) PSA elevation-patient with a PSA of 2.1 in 2018 which rose to 4.3 in January 2023. No prior biopsy. He had a left hip replacement (scatter imaging signal). No rectal access from prior LAR/ileostomy in 2015 then a colostomy (h/o colon ca).    His April 2023 PSA normalized to 3.3.   Today, seen for the above and for cystoscopy.  Also management of ED and kidney stones.  He noted a harder area in the left inguinal area.   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    Blood pressure (!) 132/95, pulse 80, height '5\' 5"'$  (1.651 m), weight 195 lb (88.5 kg). NED. A&Ox3.   No respiratory distress   Abd soft, NT, ND Normal phallus with bilateral descended testicles I did not palpate any inguinal adenopathy.  No inguinal hernia.  Possibly a little bit more thicker tissue or cord lipoma on the left side where he has his ostomy, but no mass or abnormality noted.  Cystoscopy Procedure Note  Patient identification was confirmed, informed consent was obtained, and patient was prepped using Betadine solution.  Lidocaine jelly was administered  per urethral meatus.     Pre-Procedure: - Inspection reveals a normal caliber ureteral meatus.  Procedure: The flexible cystoscope was introduced without difficulty - No urethral strictures/lesions are present. - moderate hyperplasia with primarily lateral lobe hypertrophy with visual obstruction - bladder neck-normal - Bilateral ureteral orifices identified - Bladder mucosa  reveals no ulcers, tumors, or lesions - No bladder stones - No trabeculation  Retroflexion shows normal bladder, bladder neck   Post-Procedure: - Patient tolerated the procedure well  Assessment/ Plan:  BPH with lower urinary tract symptoms-again we discussed medical versus procedural management.  He is most interested in procedures to get off the medication.  He is eligible for a variety of options including UroLift, Rezum, laser vaporization or TURP.  All questions answered.  He will proceed with laser vaporization.  He is not concerned about retrograde ejaculation for which we discussed among other risks such as incontinence or stricture.  Also discussed flow symptoms typically improved but frequency and urgency can persist.  All questions answered.  He will proceed.  ED-we discussed again the nature risk and benefits of injections versus an IPP.  He will consider.  Kidney stone-we discussed doing the kidney stone at the same time as the prostate but will focus on the prostate.  He might be a good candidate for shockwave  down the road.   No follow-ups on file.  Festus Aloe, MD

## 2022-02-06 NOTE — Progress Notes (Signed)
[name] 1956/05/17 [id number]  To whom it may concern:  [name] is under my care.  He has a condition which causes him to have to take frequent bathroom breaks.  Please take this into consideration when considering his fitness for jury duty.  Sincerely,  Connor Dover, MD [facility] [place]

## 2022-02-13 ENCOUNTER — Other Ambulatory Visit: Payer: Self-pay | Admitting: Urology

## 2022-02-14 ENCOUNTER — Ambulatory Visit (INDEPENDENT_AMBULATORY_CARE_PROVIDER_SITE_OTHER): Payer: No Typology Code available for payment source

## 2022-02-14 VITALS — BP 126/78 | HR 74 | Ht 66.0 in | Wt 195.0 lb

## 2022-02-14 DIAGNOSIS — Z0000 Encounter for general adult medical examination without abnormal findings: Secondary | ICD-10-CM | POA: Diagnosis not present

## 2022-02-16 ENCOUNTER — Ambulatory Visit (INDEPENDENT_AMBULATORY_CARE_PROVIDER_SITE_OTHER): Payer: No Typology Code available for payment source | Admitting: Family Medicine

## 2022-02-16 ENCOUNTER — Telehealth: Payer: Self-pay

## 2022-02-16 ENCOUNTER — Encounter: Payer: Self-pay | Admitting: Family Medicine

## 2022-02-16 DIAGNOSIS — K435 Parastomal hernia without obstruction or  gangrene: Secondary | ICD-10-CM | POA: Diagnosis not present

## 2022-02-16 DIAGNOSIS — K432 Incisional hernia without obstruction or gangrene: Secondary | ICD-10-CM | POA: Diagnosis not present

## 2022-02-16 DIAGNOSIS — L729 Follicular cyst of the skin and subcutaneous tissue, unspecified: Secondary | ICD-10-CM | POA: Insufficient documentation

## 2022-02-16 DIAGNOSIS — K4090 Unilateral inguinal hernia, without obstruction or gangrene, not specified as recurrent: Secondary | ICD-10-CM | POA: Diagnosis not present

## 2022-02-16 NOTE — Patient Instructions (Signed)
No heavy lifting.  Referral placed.  We will be in touch.  Take care  Dr. Lacinda Axon

## 2022-02-16 NOTE — Telephone Encounter (Signed)
Script written out and will fax once PCP signs.

## 2022-02-16 NOTE — Assessment & Plan Note (Signed)
Needs repair.  Sending to general surgery.

## 2022-02-16 NOTE — Telephone Encounter (Signed)
During AWV, pt c/o issues with current Ostomy supplies not sticking well and are not large enough for opening. Pt asks if an Rx can be sent in to [facility] for larger, pre-cut ostomy wafer and ostomy pouch. Pt currently uses ostomy wafer #[id number] 2 1/4"-57 mm x 1 1/2-38 mm.  Ostomy pouch # [id number] 2 1/4-57 mm Pt requests the next size up from these measurements. Rx can be faxed to [phone]. Thank you.   Phone number for [facility] [phone].

## 2022-02-16 NOTE — Progress Notes (Signed)
Subjective:  Patient ID: [name], male    DOB: 1955-09-20  Age: 66 y.o. MRN: [id number]  CC: Chief Complaint  Patient presents with   Cyst    Pt had AWV with Karle Starch on Tuesday. Pt states he has some bumps on back that have been there for a while; wife is able to squeeze pus out of them. Knot on left side of groin that has been there about one month. Ostomy wafers are not sticking well due to stoma fissures per patient.     HPI:  66 year old male presents for evaluation of the above.  Patient has a history of rectal cancer and has an ostomy.  He has a large parastomal hernia.  He also has incisional hernia on the right side of his abdomen.  He states that over the past several weeks he has been bothered by pain in the groin.  He also reports that he has "bumps" on his back which his wife squeezes.  He states that she informed him she would not be doing that anymore and advised him to see his doctor.  He states that the bumps on his back are not particular bothersome.  He is bothered by the groin pain which is on the left side.  No relieving factors.  He reports that his large hernia related to his ostomy impacts his quality of life and makes it somewhat difficult for him to breathe at times.  Patient Active Problem List   Diagnosis Date Noted   Incisional hernia 02/16/2022   Right inguinal hernia 02/16/2022   Parastomal hernia 02/16/2022   Skin cyst 02/16/2022   Hyperlipidemia 09/14/2021   Incarcerated incisional hernia s/p lap repair with mesh 04/11/2017 04/11/2017   Colostomy in place [facility])    Essential hypertension, benign 05/12/2014   Obesity (BMI 30-39.9) 03/24/2014   Rectal cancer, uT2uN0, s/p LAR/ileostomy 03/20/2014 colostomy Aug2015 12/25/2013   GERD (gastroesophageal reflux disease) 11/27/2013    Social Hx   Social History   Socioeconomic History   Marital status: Married    Spouse name: Not on file   Number of children: Not on file   Years of education:  Not on file   Highest education level: Not on file  Occupational History   Not on file  Tobacco Use   Smoking status: Former    Packs/day: 0.00    Years: 15.00    Total pack years: 0.00    Types: Cigars, Cigarettes    Quit date: 09/10/2011    Years since quitting: 10.4   Smokeless tobacco: Never  Vaping Use   Vaping Use: Never used  Substance and Sexual Activity   Alcohol use: No    Comment: quit years ago   Drug use: No   Sexual activity: Not on file  Other Topics Concern   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   Social Determinants of Health   Financial Resource Strain: Low Risk  (02/14/2022)   Overall Financial Resource Strain (CARDIA)    Difficulty of Paying Living Expenses: Not hard at all  Food Insecurity: No Food Insecurity (02/14/2022)   Hunger Vital Sign    Worried About Running Out of Food in the Last Year: Never true    Ran Out of Food in the Last Year: Never true  Transportation Needs: No Transportation Needs (02/14/2022)   PRAPARE - Hydrologist (Medical): No    Lack of Transportation (  Non-Medical): No  Physical Activity: Sufficiently Active (02/14/2022)   Exercise Vital Sign    Days of Exercise per Week: 5 days    Minutes of Exercise per Session: 30 min  Stress: No Stress Concern Present (02/14/2022)   [place]    Feeling of Stress : Not at all  Social Connections: [place] (02/14/2022)   Social Connection and Isolation Panel [NHANES]    Frequency of Communication with Friends and Family: More than three times a week    Frequency of Social Gatherings with Friends and Family: More than three times a week    Attends Religious Services: More than 4 times per year    Active Member of [employer] or Organizations: Yes    Attends Music therapist: More than 4 times per year    Marital Status: Married    Review  of Systems Per HPI  Objective:  BP 120/76   Pulse 67   Temp 98.1 F (36.7 C)   Wt 200 lb (90.7 kg)   SpO2 95%   BMI 32.28 kg/m      02/16/2022    8:21 AM 02/14/2022    8:12 AM 02/06/2022    9:57 AM  BP/Weight  Systolic BP 195 093 267  Diastolic BP 76 78 95  Wt. (Lbs) 200 195 195  BMI 32.28 kg/m2 31.47 kg/m2 32.45 kg/m2    Physical Exam Constitutional:      General: He is not in acute distress.    Appearance: Normal appearance.  HENT:     Head: Normocephalic and atraumatic.  Abdominal:       Comments: Area labeled and red -incisional hernia noted.  Area labeled and blue -large hernia associated with his stoma.  Genitourinary:    Comments: Right inguinal hernia noted. No appreciable abscess or hernia on the left side. Skin:         Comments: Upper back with a firm palpable nodule which is consistent with a cyst.  Neurological:     Mental Status: He is alert.     Lab Results  Component Value Date   WBC 7.8 09/14/2021   HGB 14.9 09/14/2021   HCT 44.3 09/14/2021   PLT 346 09/14/2021   GLUCOSE 92 09/14/2021   CHOL 198 09/14/2021   TRIG 164 (H) 09/14/2021   HDL 38 (L) 09/14/2021   LDLCALC 131 (H) 09/14/2021   ALT 31 09/14/2021   AST 18 09/14/2021   NA 138 09/14/2021   K 4.5 09/14/2021   CL 100 09/14/2021   CREATININE 0.88 09/14/2021   BUN 22 09/14/2021   CO2 25 09/14/2021   TSH 2.029 10/18/2018   INR 0.99 12/25/2013   HGBA1C 5.9 (H) 09/14/2021     Assessment & Plan:   Problem List Items Addressed This Visit       Musculoskeletal and Integument   Skin cyst    Skin cyst does not appear to be bothersome for him.  It is not infected.  We will continue to monitor.        Other   Right inguinal hernia    Needs repair.  Sending to general surgery.      Relevant Orders   Ambulatory referral to General Surgery   Parastomal hernia    I discussed his case with Dr. Wolfgang Phoenix and patient and I had a discussion about this.  We have elected to send him  for a second opinion at [facility].  Relevant Orders   Ambulatory referral to General Surgery   Incisional hernia   Relevant Orders   Ambulatory referral to [facility]

## 2022-02-16 NOTE — Assessment & Plan Note (Signed)
I discussed his case with Dr. Wolfgang Phoenix and patient and I had a discussion about this.  We have elected to send him for a second opinion at [facility].

## 2022-02-16 NOTE — Assessment & Plan Note (Signed)
Skin cyst does not appear to be bothersome for him.  It is not infected.  We will continue to monitor.

## 2022-02-17 DIAGNOSIS — Z933 Colostomy status: Secondary | ICD-10-CM | POA: Diagnosis not present

## 2022-03-09 ENCOUNTER — Other Ambulatory Visit: Payer: Self-pay

## 2022-03-09 MED ORDER — ROSUVASTATIN CALCIUM 10 MG PO TABS: 10.0000 mg | ORAL_TABLET | Freq: Every day | ORAL | 0 refills | Status: DC

## 2022-03-14 ENCOUNTER — Ambulatory Visit (INDEPENDENT_AMBULATORY_CARE_PROVIDER_SITE_OTHER): Payer: No Typology Code available for payment source | Admitting: Family Medicine

## 2022-03-14 VITALS — BP 137/85 | HR 68 | Temp 98.4°F | Ht 66.0 in | Wt 198.4 lb

## 2022-03-14 DIAGNOSIS — I10 Essential (primary) hypertension: Secondary | ICD-10-CM

## 2022-03-14 DIAGNOSIS — Z130 Encounter for screening for diseases of the blood and blood-forming organs and certain disorders involving the immune mechanism: Secondary | ICD-10-CM

## 2022-03-14 DIAGNOSIS — R748 Abnormal levels of other serum enzymes: Secondary | ICD-10-CM | POA: Diagnosis not present

## 2022-03-14 DIAGNOSIS — R7303 Prediabetes: Secondary | ICD-10-CM | POA: Diagnosis not present

## 2022-03-14 DIAGNOSIS — E785 Hyperlipidemia, unspecified: Secondary | ICD-10-CM

## 2022-03-14 DIAGNOSIS — K435 Parastomal hernia without obstruction or  gangrene: Secondary | ICD-10-CM | POA: Diagnosis not present

## 2022-03-14 DIAGNOSIS — K219 Gastro-esophageal reflux disease without esophagitis: Secondary | ICD-10-CM | POA: Diagnosis not present

## 2022-03-14 MED ORDER — AMLODIPINE BESYLATE 5 MG PO TABS: 5.0000 mg | ORAL_TABLET | Freq: Every day | ORAL | 3 refills | Status: DC

## 2022-03-14 MED ORDER — BACLOFEN 10 MG PO TABS: 10.0000 mg | ORAL_TABLET | Freq: Every evening | ORAL | 3 refills | Status: DC | PRN

## 2022-03-14 MED ORDER — METOPROLOL TARTRATE 25 MG PO TABS: 25.0000 mg | ORAL_TABLET | Freq: Two times a day (BID) | ORAL | 3 refills | Status: DC

## 2022-03-14 NOTE — Assessment & Plan Note (Signed)
Awaiting surgical consultation at [facility].

## 2022-03-14 NOTE — Patient Instructions (Signed)
Labs today.  Follow up in 6 months.  Take care  Dr. Nikole Swartzentruber  

## 2022-03-14 NOTE — Assessment & Plan Note (Signed)
Stable on Omeprazole.  

## 2022-03-14 NOTE — Assessment & Plan Note (Signed)
Stable.  Continue metoprolol and amlodipine. 

## 2022-03-14 NOTE — Progress Notes (Signed)
Subjective:  Patient ID: [name], male    DOB: 1955-12-05  Age: 66 y.o. MRN: [id number]  CC: Chief Complaint  Patient presents with   Hypertension    Patient here for 6 month follow up   Medication Refill    HPI:  66 year old male with HTN, history of Rectal Cancer (colostomy in place), Parastomal hernia (and other hernias as well), HLD, Obesity presents for follow up.  Has consultation regarding hernia in August. He is still having significant discomfort and the size of the hernia interferes with activity.  Hypertension is stable on Metoprolol and Amlodipine.  Tolerating crestor. Needs recheck of lipids.  GERD is stable on Omeprazole.  Patient Active Problem List   Diagnosis Date Noted   Incisional hernia 02/16/2022   Right inguinal hernia 02/16/2022   Parastomal hernia 02/16/2022   Skin cyst 02/16/2022   Hyperlipidemia 09/14/2021   Incarcerated incisional hernia s/p lap repair with mesh 04/11/2017 04/11/2017   Colostomy in place [facility])    Essential hypertension, benign 05/12/2014   Obesity (BMI 30-39.9) 03/24/2014   Rectal cancer, uT2uN0, s/p LAR/ileostomy 03/20/2014 colostomy Aug2015 12/25/2013   GERD (gastroesophageal reflux disease) 11/27/2013    Social Hx   Social History   Socioeconomic History   Marital status: Married    Spouse name: Not on file   Number of children: Not on file   Years of education: Not on file   Highest education level: Not on file  Occupational History   Not on file  Tobacco Use   Smoking status: Former    Packs/day: 0.00    Years: 15.00    Total pack years: 0.00    Types: Cigars, Cigarettes    Quit date: 09/10/2011    Years since quitting: 10.5   Smokeless tobacco: Never  Vaping Use   Vaping Use: Never used  Substance and Sexual Activity   Alcohol use: No    Comment: quit years ago   Drug use: No   Sexual activity: Not on file  Other Topics Concern   Not on file  Social History Narrative   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   Social Determinants of Health   Financial Resource Strain: Low Risk  (02/14/2022)   Overall Financial Resource Strain (CARDIA)    Difficulty of Paying Living Expenses: Not hard at all  Food Insecurity: No Food Insecurity (02/14/2022)   Hunger Vital Sign    Worried About Running Out of Food in the Last Year: Never true    Ran Out of Food in the Last Year: Never true  Transportation Needs: No Transportation Needs (02/14/2022)   PRAPARE - Hydrologist (Medical): No    Lack of Transportation (Non-Medical): No  Physical Activity: Sufficiently Active (02/14/2022)   Exercise Vital Sign    Days of Exercise per Week: 5 days    Minutes of Exercise per Session: 30 min  Stress: No Stress Concern Present (02/14/2022)   [place]    Feeling of Stress : Not at all  Social Connections: [place] (02/14/2022)   Social Connection and Isolation Panel [NHANES]    Frequency of Communication with Friends and Family: More than three times a week    Frequency of Social Gatherings with Friends and Family: More than three times a week    Attends Religious Services: More than 4 times per year    Active Member of [employer] or Organizations: Yes  Attends Archivist Meetings: More than 4 times per year    Marital Status: Married    Review of Systems Per HPI  Objective:  BP 137/85   Pulse 68   Temp 98.4 F (36.9 C) (Oral)   Ht _0  (1.676 m)   Wt 198 lb 6.4 oz (90 kg)   SpO2 95%   BMI 32.02 kg/m      03/14/2022   10:03 AM 02/16/2022    8:21 AM 02/14/2022    8:12 AM  BP/Weight  Systolic BP 147 829 562  Diastolic BP 85 76 78  Wt. (Lbs) 198.4 200 195  BMI 32.02 kg/m2 32.28 kg/m2 31.47 kg/m2    Physical Exam Constitutional:      General: He is not in acute distress.    Appearance: Normal appearance.  HENT:     Head: Normocephalic and atraumatic.  Eyes:      General:        Right eye: No discharge.        Left eye: No discharge.     Conjunctiva/sclera: Conjunctivae normal.  Cardiovascular:     Rate and Rhythm: Normal rate and regular rhythm.  Pulmonary:     Effort: Pulmonary effort is normal.     Breath sounds: Normal breath sounds. No wheezing or rales.  Abdominal:     Palpations: Abdomen is soft.     Comments: Large parastomal hernia.   Neurological:     Mental Status: He is alert.  Psychiatric:        Mood and Affect: Mood normal.        Behavior: Behavior normal.     Lab Results  Component Value Date   WBC 7.8 09/14/2021   HGB 14.9 09/14/2021   HCT 44.3 09/14/2021   PLT 346 09/14/2021   GLUCOSE 92 09/14/2021   CHOL 198 09/14/2021   TRIG 164 (H) 09/14/2021   HDL 38 (L) 09/14/2021   LDLCALC 131 (H) 09/14/2021   ALT 31 09/14/2021   AST 18 09/14/2021   NA 138 09/14/2021   K 4.5 09/14/2021   CL 100 09/14/2021   CREATININE 0.88 09/14/2021   BUN 22 09/14/2021   CO2 25 09/14/2021   TSH 2.029 10/18/2018   INR 0.99 12/25/2013   HGBA1C 5.9 (H) 09/14/2021     Assessment & Plan:   Problem List Items Addressed This Visit       Cardiovascular and Mediastinum   Essential hypertension, benign - Primary (Chronic)    Stable. Continue metoprolol and amlodipine.      Relevant Medications   amLODipine (NORVASC) 5 MG tablet   metoprolol tartrate (LOPRESSOR) 25 MG tablet   Other Relevant Orders   CMP14+EGFR     Digestive   GERD (gastroesophageal reflux disease) (Chronic)    Stable on Omeprazole.         Other   Hyperlipidemia    Tolerating Crestor. Checking lipid panel.       Relevant Medications   amLODipine (NORVASC) 5 MG tablet   metoprolol tartrate (LOPRESSOR) 25 MG tablet   Other Relevant Orders   Lipid panel   Parastomal hernia    Awaiting surgical consultation at [facility].      Other Visit Diagnoses     Prediabetes       Relevant Orders   Hemoglobin A1c   Elevated alkaline phosphatase level        Relevant Orders   Gamma GT   Screening for deficiency anemia  Relevant Orders   CBC       Meds ordered this encounter  Medications   amLODipine (NORVASC) 5 MG tablet    Sig: Take 1 tablet (5 mg total) by mouth daily.    Dispense:  90 tablet    Refill:  3   baclofen (LIORESAL) 10 MG tablet    Sig: Take 1 tablet (10 mg total) by mouth at bedtime as needed for muscle spasms. For leg cramping    Dispense:  90 tablet    Refill:  3   metoprolol tartrate (LOPRESSOR) 25 MG tablet    Sig: Take 1 tablet (25 mg total) by mouth 2 (two) times daily.    Dispense:  180 tablet    Refill:  3    Follow-up:  Return in about 6 months (around 09/14/2022).  [place]

## 2022-03-14 NOTE — Assessment & Plan Note (Signed)
Tolerating Crestor. Checking lipid panel.

## 2022-03-15 LAB — CMP14+EGFR
ALT: 24 IU/L (ref 0–44)
AST: 15 IU/L (ref 0–40)
Albumin/Globulin Ratio: 1.8 (ref 1.2–2.2)
Albumin: 4.2 g/dL (ref 3.9–4.9)
Alkaline Phosphatase: 113 IU/L (ref 44–121)
BUN/Creatinine Ratio: 22 (ref 10–24)
BUN: 19 mg/dL (ref 8–27)
Bilirubin Total: 0.4 mg/dL (ref 0.0–1.2)
CO2: 27 mmol/L (ref 20–29)
Calcium: 9.4 mg/dL (ref 8.6–10.2)
Chloride: 100 mmol/L (ref 96–106)
Creatinine, Ser: 0.88 mg/dL (ref 0.76–1.27)
Globulin, Total: 2.3 g/dL (ref 1.5–4.5)
Glucose: 96 mg/dL (ref 70–99)
Potassium: 4.7 mmol/L (ref 3.5–5.2)
Sodium: 140 mmol/L (ref 134–144)
Total Protein: 6.5 g/dL (ref 6.0–8.5)
eGFR: 95 mL/min/{1.73_m2} (ref >59)

## 2022-03-15 LAB — CBC
Hematocrit: 43.9 % (ref 37.5–51.0)
Hemoglobin: 14.6 g/dL (ref 13.0–17.7)
MCH: 29.5 pg (ref 26.6–33.0)
MCHC: 33.3 g/dL (ref 31.5–35.7)
MCV: 89 fL (ref 79–97)
Platelets: 330 10*3/uL (ref 150–450)
RBC: 4.95 x10E6/uL (ref 4.14–5.80)
RDW: 13.9 % (ref 11.6–15.4)
WBC: 8.1 10*3/uL (ref 3.4–10.8)

## 2022-03-15 LAB — LIPID PANEL
Chol/HDL Ratio: 4 ratio (ref 0.0–5.0)
Cholesterol, Total: 151 mg/dL (ref 100–199)
HDL: 38 mg/dL — ABNORMAL LOW (ref >39)
LDL Chol Calc (NIH): 85 mg/dL (ref 0–99)
Triglycerides: 158 mg/dL — ABNORMAL HIGH (ref 0–149)
VLDL Cholesterol Cal: 28 mg/dL (ref 5–40)

## 2022-03-15 LAB — HEMOGLOBIN A1C
Est. average glucose Bld gHb Est-mCnc: 117 mg/dL
Hgb A1c MFr Bld: 5.7 % — ABNORMAL HIGH (ref 4.8–5.6)

## 2022-03-15 LAB — GAMMA GT: GGT: 39 IU/L (ref 0–65)

## 2022-03-27 ENCOUNTER — Encounter (HOSPITAL_BASED_OUTPATIENT_CLINIC_OR_DEPARTMENT_OTHER): Payer: Self-pay | Admitting: Urology

## 2022-03-29 ENCOUNTER — Other Ambulatory Visit: Payer: Self-pay

## 2022-03-29 ENCOUNTER — Encounter (HOSPITAL_BASED_OUTPATIENT_CLINIC_OR_DEPARTMENT_OTHER): Payer: Self-pay | Admitting: Urology

## 2022-03-29 NOTE — Progress Notes (Addendum)
Spoke w/ via phone for pre-op interview---pt Lab needs dos---- I stat , ekg             Lab results------ COVID test -----patient states asymptomatic no test needed Arrive at -------530 am 04-04-2022 NPO after MN NO Solid Food.  Clear liquids from MN until---430 am Med rec completed Medications to take morning of surgery -----amlodipine, metoprolol tartrate, omeprazole, rosuvastatin Diabetic medication ----n-/a Patient instructed no nail polish to be worn day of surgery Patient instructed to bring photo id and insurance card day of surgery Patient aware to have Driver (ride ) / caregiver    wife tina will stay for 24 hours after surgery  Patient Special Instructions -----bring colostomy supplies day of surgery Pre-Op special Istructions -----patient instructed from dr Junious Silk to stop 81 mg aspirin 2 weeks before surgery last 81 mg aspirin was 03-20-2022 per patient Patient verbalized understanding of instructions that were given at this phone interview. Patient denies shortness of breath, chest pain, fever, cough at this phone interview.   Ekg 04-01-2021 chart/epic Echo 03-26-2018 chart/epic Stress test 03-26-2018 chart/epic

## 2022-04-03 NOTE — Anesthesia Preprocedure Evaluation (Addendum)
Anesthesia Evaluation  Patient identified by MRN, date of birth, ID band Patient awake    Reviewed: Allergy & Precautions, NPO status , Patient's Chart, lab work & pertinent test results  Airway Mallampati: II  TM Distance: >3 FB Neck ROM: Full    Dental no notable dental hx. (+) Edentulous Upper, Missing, Poor Dentition, Dental Advisory Given,    Pulmonary former smoker,    Pulmonary exam normal breath sounds clear to auscultation       Cardiovascular hypertension, Pt. on medications Normal cardiovascular exam Rhythm:Regular Rate:Normal  2019 echo EF 55-60%   Neuro/Psych negative neurological ROS  negative psych ROS   GI/Hepatic Neg liver ROS, GERD  Medicated and Controlled,Hx of rectal ca . Colostomy in place   Endo/Other    Renal/GU      Musculoskeletal  (+) Arthritis , Osteoarthritis,    Abdominal (+) + obese (BMI 33),   Peds  Hematology Lab Results      Component                Value               Date                      WBC                      8.1                 03/14/2022                HGB                      14.6                03/14/2022                HCT                      43.9                03/14/2022                MCV                      89                  03/14/2022                PLT                      330                 03/14/2022              Anesthesia Other Findings   Reproductive/Obstetrics                           Anesthesia Physical Anesthesia Plan  ASA: 3  Anesthesia Plan: General   Post-op Pain Management:    Induction: Intravenous  PONV Risk Score and Plan: 3 and Treatment may vary due to age or medical condition, Midazolam and Ondansetron  Airway Management Planned: LMA  Additional Equipment: None  Intra-op Plan:   Post-operative Plan:   Informed Consent: I have reviewed the patients History and Physical, chart, labs and discussed  the procedure including  the risks, benefits and alternatives for the proposed anesthesia with the patient or authorized representative who has indicated his/her understanding and acceptance.     Dental advisory given  Plan Discussed with:   Anesthesia Plan Comments:       Anesthesia Quick Evaluation

## 2022-04-04 ENCOUNTER — Ambulatory Visit (HOSPITAL_BASED_OUTPATIENT_CLINIC_OR_DEPARTMENT_OTHER)
Admission: RE | Admit: 2022-04-04 | Discharge: 2022-04-04 | Disposition: A | Discharge status: Home / Self Care | Payer: No Typology Code available for payment source | Attending: Urology | Admitting: Urology

## 2022-04-04 ENCOUNTER — Encounter (HOSPITAL_BASED_OUTPATIENT_CLINIC_OR_DEPARTMENT_OTHER)
Admission: RE | Disposition: A | Discharge status: Home / Self Care | Payer: Self-pay | Source: Home / Self Care | Attending: Urology

## 2022-04-04 ENCOUNTER — Other Ambulatory Visit: Payer: Self-pay | Admitting: Family Medicine

## 2022-04-04 ENCOUNTER — Ambulatory Visit (HOSPITAL_BASED_OUTPATIENT_CLINIC_OR_DEPARTMENT_OTHER): Payer: No Typology Code available for payment source | Admitting: Anesthesiology

## 2022-04-04 ENCOUNTER — Encounter (HOSPITAL_BASED_OUTPATIENT_CLINIC_OR_DEPARTMENT_OTHER): Payer: Self-pay | Admitting: Urology

## 2022-04-04 ENCOUNTER — Other Ambulatory Visit: Payer: Self-pay

## 2022-04-04 DIAGNOSIS — R3912 Poor urinary stream: Secondary | ICD-10-CM

## 2022-04-04 DIAGNOSIS — N401 Enlarged prostate with lower urinary tract symptoms: Secondary | ICD-10-CM

## 2022-04-04 DIAGNOSIS — I10 Essential (primary) hypertension: Secondary | ICD-10-CM | POA: Diagnosis not present

## 2022-04-04 DIAGNOSIS — Z87891 Personal history of nicotine dependence: Secondary | ICD-10-CM | POA: Diagnosis not present

## 2022-04-04 DIAGNOSIS — M79672 Pain in left foot: Secondary | ICD-10-CM

## 2022-04-04 DIAGNOSIS — Z01818 Encounter for other preprocedural examination: Secondary | ICD-10-CM

## 2022-04-04 DIAGNOSIS — N138 Other obstructive and reflux uropathy: Secondary | ICD-10-CM | POA: Diagnosis not present

## 2022-04-04 DIAGNOSIS — M1990 Unspecified osteoarthritis, unspecified site: Secondary | ICD-10-CM | POA: Diagnosis not present

## 2022-04-04 HISTORY — DX: Parastomal hernia without obstruction or gangrene: K43.5

## 2022-04-04 HISTORY — DX: Incisional hernia without obstruction or gangrene: K43.2

## 2022-04-04 HISTORY — PX: THULIUM LASER TURP (TRANSURETHRAL RESECTION OF PROSTATE): SHX6744

## 2022-04-04 HISTORY — DX: Dyspnea, unspecified: R06.00

## 2022-04-04 HISTORY — DX: Restless legs syndrome: G25.81

## 2022-04-04 HISTORY — DX: Benign prostatic hyperplasia without lower urinary tract symptoms: N40.0

## 2022-04-04 HISTORY — DX: Unilateral inguinal hernia, without obstruction or gangrene, not specified as recurrent: K40.90

## 2022-04-04 LAB — POCT I-STAT, CHEM 8
BUN: 24 mg/dL — ABNORMAL HIGH (ref 8–23)
Calcium, Ion: 1.2 mmol/L (ref 1.15–1.40)
Chloride: 103 mmol/L (ref 98–111)
Creatinine, Ser: 0.8 mg/dL (ref 0.61–1.24)
Glucose, Bld: 106 mg/dL — ABNORMAL HIGH (ref 70–99)
HCT: 45 % (ref 39.0–52.0)
Hemoglobin: 15.3 g/dL (ref 13.0–17.0)
Potassium: 3.9 mmol/L (ref 3.5–5.1)
Sodium: 141 mmol/L (ref 135–145)
TCO2: 23 mmol/L (ref 22–32)

## 2022-04-04 SURGERY — THULIUM LASER TURP (TRANSURETHRAL RESECTION OF PROSTATE)
Anesthesia: General | Site: Prostate

## 2022-04-04 MED ORDER — ACETAMINOPHEN 10 MG/ML IV SOLN: 1000.0000 mg | Freq: Once | INTRAVENOUS | Status: DC | PRN

## 2022-04-04 MED ORDER — FENTANYL CITRATE (PF) 100 MCG/2ML IJ SOLN
INTRAMUSCULAR | Status: AC
  Filled 2022-04-04: qty 2

## 2022-04-04 MED ORDER — CEPHALEXIN 500 MG PO CAPS: 500.0000 mg | ORAL_CAPSULE | Freq: Every day | ORAL | 0 refills | Status: DC

## 2022-04-04 MED ORDER — CEFAZOLIN SODIUM-DEXTROSE 2-4 GM/100ML-% IV SOLN
2.0000 g | Freq: Once | INTRAVENOUS | Status: AC
  Administered 2022-04-04: 2 g via INTRAVENOUS

## 2022-04-04 MED ORDER — ONDANSETRON HCL 4 MG/2ML IJ SOLN: 4.0000 mg | Freq: Once | INTRAMUSCULAR | Status: DC | PRN

## 2022-04-04 MED ORDER — FENTANYL CITRATE (PF) 100 MCG/2ML IJ SOLN
INTRAMUSCULAR | Status: DC | PRN
  Administered 2022-04-04: 25 ug via INTRAVENOUS
  Administered 2022-04-04: 50 ug via INTRAVENOUS

## 2022-04-04 MED ORDER — DEXAMETHASONE SODIUM PHOSPHATE 4 MG/ML IJ SOLN
INTRAMUSCULAR | Status: DC | PRN
  Administered 2022-04-04: 8 mg via INTRAVENOUS

## 2022-04-04 MED ORDER — OXYCODONE HCL 5 MG PO TABS: 5.0000 mg | ORAL_TABLET | Freq: Once | ORAL | Status: DC | PRN

## 2022-04-04 MED ORDER — 0.9 % SODIUM CHLORIDE (POUR BTL) OPTIME
TOPICAL | Status: DC | PRN
  Administered 2022-04-04: 500 mL

## 2022-04-04 MED ORDER — MIDAZOLAM HCL 2 MG/2ML IJ SOLN
INTRAMUSCULAR | Status: DC | PRN
  Administered 2022-04-04: 1 mg via INTRAVENOUS

## 2022-04-04 MED ORDER — MIDAZOLAM HCL 2 MG/2ML IJ SOLN
INTRAMUSCULAR | Status: AC
  Filled 2022-04-04: qty 2

## 2022-04-04 MED ORDER — LIDOCAINE HCL (CARDIAC) PF 100 MG/5ML IV SOSY
PREFILLED_SYRINGE | INTRAVENOUS | Status: DC | PRN
  Administered 2022-04-04: 60 mg via INTRAVENOUS

## 2022-04-04 MED ORDER — FENTANYL CITRATE (PF) 100 MCG/2ML IJ SOLN: 25.0000 ug | INTRAMUSCULAR | Status: DC | PRN

## 2022-04-04 MED ORDER — STERILE WATER FOR IRRIGATION IR SOLN
Status: DC | PRN
  Administered 2022-04-04: 500 mL

## 2022-04-04 MED ORDER — SODIUM CHLORIDE 0.9 % IR SOLN
Status: DC | PRN
  Administered 2022-04-04 (×2): 6000 mL

## 2022-04-04 MED ORDER — OXYCODONE HCL 5 MG/5ML PO SOLN: 5.0000 mg | Freq: Once | ORAL | Status: DC | PRN

## 2022-04-04 MED ORDER — CEFAZOLIN SODIUM-DEXTROSE 2-4 GM/100ML-% IV SOLN
INTRAVENOUS | Status: AC
  Filled 2022-04-04: qty 100

## 2022-04-04 MED ORDER — PROPOFOL 10 MG/ML IV BOLUS
INTRAVENOUS | Status: DC | PRN
  Administered 2022-04-04: 150 mg via INTRAVENOUS

## 2022-04-04 MED ORDER — LACTATED RINGERS IV SOLN
INTRAVENOUS | Status: DC
Start: 2022-04-04 — End: 2022-04-04

## 2022-04-04 MED ORDER — ONDANSETRON HCL 4 MG/2ML IJ SOLN
INTRAMUSCULAR | Status: DC | PRN
  Administered 2022-04-04: 4 mg via INTRAVENOUS

## 2022-04-04 SURGICAL SUPPLY — 22 items
BAG DRAIN URO-CYSTO SKYTR STRL (DRAIN) ×2 IMPLANT
BAG DRN RND TRDRP ANRFLXCHMBR (UROLOGICAL SUPPLIES) ×1
BAG DRN UROCATH (DRAIN) ×1
BAG URINE DRAIN 2000ML AR STRL (UROLOGICAL SUPPLIES) ×2 IMPLANT
CATH COUDE FOLEY 2W 5CC 20FR (CATHETERS) ×1 IMPLANT
CLOTH BEACON ORANGE TIMEOUT ST (SAFETY) ×2 IMPLANT
GLOVE BIO SURGEON STRL SZ7.5 (GLOVE) ×2 IMPLANT
GLOVE BIO SURGEON STRL SZ8 (GLOVE) ×1 IMPLANT
GLOVE BIOGEL PI IND STRL 7.0 (GLOVE) IMPLANT
GLOVE BIOGEL PI INDICATOR 7.0 (GLOVE) ×2
GOWN STRL REUS W/TWL LRG LVL3 (GOWN DISPOSABLE) ×3 IMPLANT
HOLDER FOLEY CATH W/STRAP (MISCELLANEOUS) ×1 IMPLANT
IV NS IRRIG 3000ML ARTHROMATIC (IV SOLUTION) ×5 IMPLANT
KIT TURNOVER CYSTO (KITS) ×2 IMPLANT
LASER REVOLIX HI ENERGY 1000 (Laser) ×2 IMPLANT
LASER REVOLIX PROCEDURE (MISCELLANEOUS) ×2 IMPLANT
MANIFOLD NEPTUNE II (INSTRUMENTS) ×1 IMPLANT
NS IRRIG 500ML POUR BTL (IV SOLUTION) ×1 IMPLANT
PACK CYSTO (CUSTOM PROCEDURE TRAY) ×2 IMPLANT
SYR 30ML LL (SYRINGE) ×1 IMPLANT
TUBE CONNECTING 12X1/4 (SUCTIONS) ×1 IMPLANT
TUBING UROLOGY SET (TUBING) ×1 IMPLANT

## 2022-04-04 NOTE — Anesthesia Procedure Notes (Signed)
Procedure Name: LMA Insertion Date/Time: 04/04/2022 7:44 AM  Performed by: Georgeanne Nim, CRNAPre-anesthesia Checklist: Patient identified, Emergency Drugs available, Suction available, Patient being monitored and Timeout performed Patient Re-evaluated:Patient Re-evaluated prior to induction Oxygen Delivery Method: Circle system utilized Preoxygenation: Pre-oxygenation with 100% oxygen Induction Type: IV induction Ventilation: Mask ventilation without difficulty LMA: LMA inserted LMA Size: 4.0 Number of attempts: 1 Placement Confirmation: positive ETCO2, CO2 detector and breath sounds checked- equal and bilateral Tube secured with: Tape Dental Injury: Teeth and Oropharynx as per pre-operative assessment

## 2022-04-04 NOTE — Anesthesia Postprocedure Evaluation (Signed)
Anesthesia Post Note  Patient: [name]  Procedure(s) Performed: Marcelino Duster LASER TURP (TRANSURETHRAL RESECTION OF PROSTATE) (Prostate)     Patient location during evaluation: PACU Anesthesia Type: General Level of consciousness: awake and alert Pain management: pain level controlled Vital Signs Assessment: post-procedure vital signs reviewed and stable Respiratory status: spontaneous breathing, nonlabored ventilation, respiratory function stable and patient connected to nasal cannula oxygen Cardiovascular status: blood pressure returned to baseline and stable Postop Assessment: no apparent nausea or vomiting Anesthetic complications: no   No notable events documented.  Last Vitals:  Vitals:   04/04/22 0918 04/04/22 0946  BP: (!) 154/86 (!) 148/82  Pulse: (!) 54 61  Resp: 14 16  Temp:  (!) 36.1 C  SpO2: 97% 98%    Last Pain:  Vitals:   04/04/22 1015  TempSrc:   PainSc: 0-No pain                 Barnet [place]

## 2022-04-04 NOTE — H&P (Signed)
H&P  Chief Complaint: BPH, weak stream  History of Present Illness: [name] is a 66 year old male with a history of BPH and lower urinary tract symptoms.  He presents today for thulium laser vaporization of the prostate.  He is well without dysuria or gross hematuria.  He has no congestion cough or shortness of breath.  He said sometimes he feels dyspnea at times due to abdominal hernias.  He saw Dr. Johney Maine and referred to [place] for surgical consultation.  Prior GU history: F/u -    1) BPH - CT October 2021 revealed about a 40 g prostate. He saw Dr. Matilde Sprang for urinary retention p above pelvic surgery which resolved. Since then has a weak stream, intermittent stream. He he has frequency. No incontinence. AUASS = 23. He read about "urolift". Looking back he has had urgency for many years. UA clear.    He started daily tadalafil in Feb 2023 with no change in LUTS and stopped it. He has weak st and nocturia. Intermittent flow. AUASS = 26. PVR is 10 ml. UA clear.   Office cystoscopy June 2023 revealed moderate hyperplasia with primarily lateral lobe hypertrophy with visual obstruction.    2) kidney stone - CT October 2021 revealed a 4 mm right lower pole stone. No h/o of stones or stone passage. Feb 2023 KUB with a 9 mm RLP stone.     3) ED - he does not get erection for several years. Tried sildenafil but yrs ago. Declined VED. He has a good libido. Daily tadalafil did not help.    4) PSA elevation-patient with a PSA of 2.1 in 2018 which rose to 4.3 in January 2023. No prior biopsy. He had a left hip replacement (scatter imaging signal). No rectal access from prior LAR/ileostomy in 2015 then a colostomy (h/o colon ca).     His April 2023 PSA normalized to 3.3.     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   Past Medical History:  Diagnosis Date   Arthritis    hands and shoulders (left shoulder is worse than right)   Aspiration pneumonia ([place])  03/24/2014   after surgery for colostomy   BPH (benign prostatic hyperplasia)    Colostomy in place [facility])    Complication of anesthesia 03/24/2014   aspiration pneumonia after surgery for colostomy, no problem with  surgeries since then   Difficulty urinating    Dyspnea    @ times due to hernias, [facility] for  [facility] consultation 04-20-2022, can lie flat   GERD (gastroesophageal reflux disease)    Hypertension    Ileostomy present ([place])    Incisional hernia    right abdomen   Parastomal hernia left    Rectal cancer, uT2uN0, s/p LAR/ileostomy 03/20/2014 colostomy Aug2015 12/25/2013   TCS MAY 2015    Restless leg    Right inguinal hernia    Shortening, leg, congenital    Left   Skin irritation    Surrounding ostomy site   Past Surgical History:  Procedure Laterality Date   COLON RESECTION N/A 03/24/2014   Procedure: Diagnostic  laparotomy, takedown of coloanal anastamosis, end colostomy, with wound vac;  Surgeon: Adin Hector, MD;  [facility];  Service: General;  Laterality: N/A;   COLONOSCOPY N/A 12/22/2013   SLF: 1. Normal mucosa in the terminal ilium. 2. Moderate diverticulosis in the transverse colon, descending colon, and sigmoid colon. 3. Rectal bleeding due ot rectal mass and hemorrhoids 4. Moderate  sized internal hemorrhoids.    COLONOSCOPY N/A 01/19/2015   Procedure: COLONOSCOPY;  Surgeon: Danie Binder, MD;  [facility];  Service: Endoscopy;  Laterality: N/A;  1215   COLONOSCOPY N/A 03/22/2018   Procedure: COLONOSCOPY;  Surgeon: Danie Binder, MD;  [facility];  Service: Endoscopy;  Laterality: N/A;  2:15pm   COLOSTOMY     03-24-2014   ESOPHAGOGASTRODUODENOSCOPY N/A 12/22/2013   SLF: Probable Candia esophagitis2. Probable Barretts esphagus 3. Mild gastritis.    EUS N/A 01/01/2014   Procedure: LOWER ENDOSCOPIC ULTRASOUND (EUS);  Surgeon: Milus Banister, MD;  Location: [facility];  Service: Endoscopy;  Laterality: N/A;    EXAMINATION UNDER ANESTHESIA  08/25/2014   Procedure: EXAM UNDER ANESTHESIA;  Surgeon: Michael Boston, MD;  [facility];  Service: General;;   eye reattachment and ear reattachment  Right 08/21/1969   FLEXIBLE SIGMOIDOSCOPY N/A 03/24/2014   Procedure: FLEXIBLE SIGMOIDOSCOPY;  Surgeon: Adin Hector, MD;  [facility];  Service: General;  Laterality: N/A;   HIP FRACTURE SURGERY Left age 55 on 04-06-1970   ILEOSTOMY     ILEOSTOMY CLOSURE N/A 08/25/2014   Procedure: TAKEDOWN OF LOOP ILEOSTOMY ;  Surgeon: Michael Boston, MD;  [facility];  Service: General;  Laterality: N/A;   INSERTION OF MESH N/A 04/11/2017   Procedure: INSERTION OF MESH ;  Surgeon: Michael Boston, MD;  [facility];  Service: General;  Laterality: N/A;  BILATERAL TAP BLOCK   TOTAL HIP ARTHROPLASTY Left 11/19/2015   Procedure: LEFT TOTAL HIP ARTHROPLASTY ANTERIOR APPROACH;  Surgeon: Mcarthur Rossetti, MD;  [facility];  Service: Orthopedics;  Laterality: Left;   VENTRAL HERNIA REPAIR N/A 04/11/2017   Procedure: LAPAROSCOPIC INCARCERATED PERISTOMAL AND INCARCERATED INCISIONAL HERNIA X2;  Surgeon: Michael Boston, MD;  [facility];  Service: General;  Laterality: N/A;  BILATERAL TAP BLOCK    Home Medications:  Medications Prior to Admission  Medication Sig Dispense Refill Last Dose   amLODipine (NORVASC) 5 MG tablet Take 1 tablet (5 mg total) by mouth daily. 90 tablet 3 04/04/2022 at 0430   baclofen (LIORESAL) 10 MG tablet Take 1 tablet (10 mg total) by mouth at bedtime as needed for muscle spasms. For leg cramping (Patient taking differently: Take 10 mg by mouth at bedtime. For leg cramping) 90 tablet 3 04/03/2022   co-enzyme Q-10 30 MG capsule Take 30 mg by mouth daily.   03/20/2022   metoprolol tartrate (LOPRESSOR) 25 MG tablet Take 1 tablet (25 mg total) by mouth 2 (two) times daily. 180 tablet 3 04/04/2022 at 0430   omeprazole (PRILOSEC) 40 MG capsule Take 1 capsule (40 mg total) by mouth daily. 90  capsule 3 04/04/2022 at 0430   rosuvastatin (CRESTOR) 10 MG tablet Take 1 tablet (10 mg total) by mouth daily. 90 tablet 0 Past Month   aspirin EC 81 MG tablet Take 81 mg by mouth daily.   03/20/2022   Beta Carotene (VITAMIN A) 25000 UNIT capsule Take 25,000 Units by mouth daily.   03/20/2022   LUTEIN PO Take 1 tablet by mouth 2 (two) times daily.    03/20/2022   Multiple Vitamins-Minerals (ONE DAILY FOR MEN 50+ ADVANCED PO) Take 1 tablet by mouth daily.    03/20/2022   OVER THE COUNTER MEDICATION Take 1 tablet by mouth 2 (two) times daily. Vinegar tablet by mouth twice a day    03/20/2022   tamsulosin (FLOMAX) 0.4 MG CAPS capsule Take 1 capsule (0.4  mg total) by mouth daily after supper. 30 capsule 11 03/15/2022   Turmeric 500 MG CAPS Take 500 mg by mouth 2 (two) times daily.    03/20/2022   vitamin B-12 (CYANOCOBALAMIN) 500 MCG tablet Take 1,000 mcg by mouth daily.   03/20/2022   vitamin C (ASCORBIC ACID) 500 MG tablet Take 500 mg by mouth 2 (two) times daily.   03/20/2022   Allergies: No Known Allergies  Family History  Problem Relation Age of Onset   Colon cancer Sister    Emphysema Mother    Heart attack Brother    Colon polyps Neg Hx    Social History:  reports that he has quit smoking. His smoking use included cigars. He has never used smokeless tobacco. He reports that he does not drink alcohol and does not use drugs.  ROS: A complete review of systems was performed.  All systems are negative except for pertinent findings as noted. Review of Systems  All other systems reviewed and are negative.    Physical Exam:  Vital signs in last 24 hours: Temp:  [97.8 F (36.6 C)] 97.8 F (36.6 C) (08/15 0537) Pulse Rate:  [57] 57 (08/15 0537) Resp:  [17] 17 (08/15 0537) BP: (147)/(88) 147/88 (08/15 0537) SpO2:  [98 %] 98 % (08/15 0537) Weight:  [88.2 kg] 88.2 kg (08/15 0537) General:  Alert and oriented, No acute distress HEENT: Normocephalic, atraumatic Cardiovascular: Regular rate and  rhythm Lungs: Regular rate and effort Abdomen: Soft, nontender, nondistended, no abdominal masses, left lower quadrant ostomy Back: No CVA tenderness Extremities: No edema, no calf pain or swelling Neurologic: Grossly intact GU: Penis circumcised without mass or lesion.  Scrotum appeared normal.  Laboratory Data:  Results for orders placed or performed during the hospital encounter of 04/04/22 (from the past 24 hour(s))  I-STAT, chem 8     Status: Abnormal   Collection Time: 04/04/22  6:21 AM  Result Value Ref Range   Sodium 141 135 - 145 mmol/L   Potassium 3.9 3.5 - 5.1 mmol/L   Chloride 103 98 - 111 mmol/L   BUN 24 (H) 8 - 23 mg/dL   Creatinine, Ser 0.80 0.61 - 1.24 mg/dL   Glucose, Bld 106 (H) 70 - 99 mg/dL   Calcium, Ion 1.20 1.15 - 1.40 mmol/L   TCO2 23 22 - 32 mmol/L   Hemoglobin 15.3 13.0 - 17.0 g/dL   HCT 45.0 39.0 - 52.0 %   No results found for this or any previous visit (from the past 240 hour(s)). Creatinine: Recent Labs    04/04/22 6213  CREATININE 0.80    Impression/Assessment:  BPH with lower urinary tract symptoms-  Plan:  Again I discussed with the patient the nature, potential benefits, risks and alternatives to thulium laser vaporization of the prostate possible TURP, including side effects of the proposed treatment, the likelihood of the patient achieving the goals of the procedure, and any potential problems that might occur during the procedure or recuperation.  Discussed postop care and Foley catheter.  All questions answered. Patient elects to proceed.    Festus Aloe 04/04/2022, 7:14 AM

## 2022-04-04 NOTE — Discharge Instructions (Signed)

## 2022-04-04 NOTE — Op Note (Signed)
Preoperative diagnosis: BPH with lower urinary tract symptoms Postoperative diagnosis: Same  Procedure: Thulium laser vaporization of the prostate  Surgeon: Junious Silk  Anesthesia: General  Indication for procedure: [name] is a 66 year old male with a history of BPH and lower urinary tract symptoms.  He elected to proceed with surgical management.  Findings: On exam penis was circumcised without mass or lesion.  Testicles descended bilaterally and palpably normal.  I did not palpate any specific inguinal hernias.  Prior APR with no rectal access.  On cystoscopy the urethra was a bit tight down at the proximal bulbar and membranous urethra but I do not think there was a specific stricture.  Prostatic urethra was obstructed by lateral and median lobe hypertrophy.  Bladder appeared normal without stone or foreign body or mucosal lesion.  Trigone and ureteral orifice ease appeared normal.  Description of procedure: After consent was obtained patient brought to the operating room.  After adequate anesthesia he was placed in lithotomy position and prepped and draped in the usual sterile fashion.  Timeout was performed to confirm the patient and procedure.  Exam under anesthesia was performed.  The laser continuous-flow scope and sheath were passed per urethra and the bladder inspected.  1000 m thulium laser fiber was passed.  Ureteral orifice ease were located and marked.  Bladder neck incision was started on the right side at about 7:00 and brought down to the apex.  And left-sided incision on the other side of the median lobe at about 5:00 at the bladder neck was made through the BPH down to the verumontanum.  I went ahead up near the bladder neck and did some anterior to posterior lateral lobe ablation bilaterally.  I then vaporized the median lobe.  I then completed the lateral lobe ablation bilaterally bladder neck down to the verumontanum.  This created an excellent channel.  Hemostasis was excellent  at low pressure.  Ureteral orifice ease were checked and noted to to be without injury and no vaporization was carried out past the verumontanum.  I left some apical tissue but I do not think this will hold up his stream.  The scope was backed down and a 20 [place] coud catheter was placed.  Irrigation was clear.  He was awakened taken the cover room in stable condition.  Complications: None  Blood loss: Minimal  Specimens: None  Drains: 20 French catheter-16 cc in the balloon  Disposition: Patient stable to PACU.

## 2022-04-04 NOTE — Transfer of Care (Signed)
Immediate Anesthesia Transfer of Care Note  Patient: [name]  Procedure(s) Performed: Marcelino Duster LASER TURP (TRANSURETHRAL RESECTION OF PROSTATE) (Prostate)  Patient Location: PACU  Anesthesia Type:General  Level of Consciousness: awake and patient cooperative  Airway & Oxygen Therapy: Patient Spontanous Breathing and Patient connected to nasal cannula oxygen  Post-op Assessment: Report given to RN and Post -op Vital signs reviewed and stable  Post vital signs: Reviewed and stable  Last Vitals:  Vitals Value Taken Time  BP 175/92 04/04/22 0840  Temp    Pulse 64 04/04/22 0843  Resp 19 04/04/22 0843  SpO2 100 % 04/04/22 0843  Vitals shown include unvalidated device data.  Last Pain:  Vitals:   04/04/22 0537  TempSrc: Oral         Complications: No notable events documented.

## 2022-04-05 ENCOUNTER — Encounter (HOSPITAL_BASED_OUTPATIENT_CLINIC_OR_DEPARTMENT_OTHER): Payer: Self-pay | Admitting: Urology

## 2022-04-10 ENCOUNTER — Encounter: Payer: Self-pay | Admitting: Urology

## 2022-04-10 ENCOUNTER — Ambulatory Visit (INDEPENDENT_AMBULATORY_CARE_PROVIDER_SITE_OTHER): Payer: No Typology Code available for payment source | Admitting: Urology

## 2022-04-10 VITALS — BP 135/86 | HR 76 | Ht 65.0 in | Wt 194.0 lb

## 2022-04-10 DIAGNOSIS — R3912 Poor urinary stream: Secondary | ICD-10-CM

## 2022-04-10 DIAGNOSIS — N401 Enlarged prostate with lower urinary tract symptoms: Secondary | ICD-10-CM | POA: Diagnosis not present

## 2022-04-10 NOTE — Progress Notes (Unsigned)
Pt s/p thulium LVP 04/04/2022. He did well. Passed VT today. Urine pink in urinal.   Stay hydrated. Call/return with problems. Finish abx.  Check PVR in 2 weeks.

## 2022-04-10 NOTE — Progress Notes (Unsigned)
Fill and Pull Catheter Removal  Patient is present today for a catheter removal.  Patient was cleaned and prepped in a sterile fashion 120m of sterile water/ saline was instilled into the bladder when the patient felt the urge to urinate. 172mof water was then drained from the balloon.  A 20FR foley cath was removed from the bladder no complications were noted .  Patient as then given some time to void on their own.  Patient can void  13021mn their own after some time.  Patient tolerated well.  Performed by: KouLevi AlandMA  Follow up/ Additional notes: Follow up with MD

## 2022-04-19 DIAGNOSIS — K435 Parastomal hernia without obstruction or  gangrene: Secondary | ICD-10-CM | POA: Diagnosis not present

## 2022-04-19 DIAGNOSIS — K432 Incisional hernia without obstruction or gangrene: Secondary | ICD-10-CM | POA: Diagnosis not present

## 2022-04-25 ENCOUNTER — Ambulatory Visit (INDEPENDENT_AMBULATORY_CARE_PROVIDER_SITE_OTHER): Payer: No Typology Code available for payment source | Admitting: Physician Assistant

## 2022-04-25 VITALS — BP 138/86 | HR 64

## 2022-04-25 DIAGNOSIS — R8271 Bacteriuria: Secondary | ICD-10-CM | POA: Diagnosis not present

## 2022-04-25 DIAGNOSIS — R3912 Poor urinary stream: Secondary | ICD-10-CM | POA: Diagnosis not present

## 2022-04-25 DIAGNOSIS — N401 Enlarged prostate with lower urinary tract symptoms: Secondary | ICD-10-CM | POA: Diagnosis not present

## 2022-04-25 LAB — URINALYSIS, ROUTINE W REFLEX MICROSCOPIC
Bilirubin, UA: NEGATIVE
Glucose, UA: NEGATIVE
Ketones, UA: NEGATIVE
Nitrite, UA: NEGATIVE
Specific Gravity, UA: 1.025 (ref 1.005–1.030)
Urobilinogen, Ur: 0.2 mg/dL (ref 0.2–1.0)
pH, UA: 5 (ref 5.0–7.5)

## 2022-04-25 LAB — BLADDER SCAN AMB NON-IMAGING: Scan Result: 1

## 2022-04-25 LAB — MICROSCOPIC EXAMINATION
Renal Epithel, UA: NONE SEEN /hpf
WBC, UA: >30 /hpf — AB (ref 0–5)

## 2022-04-25 NOTE — Progress Notes (Signed)
Assessment: 1. Benign prostatic hyperplasia with weak urinary stream - Urinalysis, Routine w reflex microscopic - BLADDER SCAN AMB NON-IMAGING  2. Bacteriuria - Urine Culture    Plan: Continue Flomax until FU in 6 weeks for PVR and UA. Urine sent for cx. No tx unless indicated by cx. Discussed hematuria and burning and pt given expectations post procedure.  Chief Complaint: No chief complaint on file.   HPI: [name] is a 66 y.o. male who presents for continued evaluation of BPH. S/p Thulium laser vaporization of the prostate performed on 04/04/22 by Dr. Junious Silk. Pt has been doing well and only c/o mild burning and occasional hematuria with clots for past few days. No fever, chills, abdominal  pain. Overall happy with voiding so far. Pt has upcoming eval for abdominal hernias post colostomy/ileostomy  UA= >30WBC, 11-30RBC, few bact, nitrite negative PVR=66m   Allergies: No Known Allergies  PMH: Past Medical History:  Diagnosis Date   Arthritis    hands and shoulders (left shoulder is worse than right)   [place] pneumonia [place] 03/24/2014   after surgery for colostomy   BPH (benign prostatic hyperplasia)    Col[facility] in [facility] ([facility]    Complication of anesthesia 03/24/2014   aspiration pneumonia after surgery for colostomy, no problem with  surgeries since then   Difficulty urinating    Dyspnea    @ times due to hernias, [facility] for  [facility] consultation 04-20-2022, can lie flat   GERD (gastroesophageal reflux disease)    Hypertension    [place] ([place]    Incisional hernia    right abdomen   Parastomal hernia left    Rectal cancer, uT2uN0, s/p LAR/ileostomy 03/20/2014 colostomy Aug2015 12/25/2013   TCS MAY 2015    Restless leg    Right inguinal hernia    Shortening, leg, congenital    Left   Skin irritation    Surrounding ostomy site    PSH: Past Surgical History:  Procedure Laterality Date   COLON RESECTION N/A 03/24/2014    Procedure: Diagnostic  laparotomy, takedown of coloanal anastamosis, end colostomy, with wound vac;  Surgeon: SAdin Hector MD;  [facility];  Service: General;  Laterality: N/A;   COLONOSCOPY N/A 12/22/2013   SLF: 1. Normal mucosa in the terminal ilium. 2. Moderate diverticulosis in the transverse colon, descending colon, and sigmoid colon. 3. Rectal bleeding due ot rectal mass and hemorrhoids 4. Moderate sized internal hemorrhoids.    COLONOSCOPY N/A 01/19/2015   Procedure: COLONOSCOPY;  Surgeon: SDanie Binder MD;  [facility];  Service: Endoscopy;  Laterality: N/A;  1215   COLONOSCOPY N/A 03/22/2018   Procedure: COLONOSCOPY;  Surgeon: FDanie Binder MD;  [facility];  Service: Endoscopy;  Laterality: N/A;  2:15pm   COLOSTOMY     03-24-2014   ESOPHAGOGASTRODUODENOSCOPY N/A 12/22/2013   SLF: Probable Candia esophagitis2. Probable Barretts esphagus 3. Mild gastritis.    EUS N/A 01/01/2014   Procedure: LOWER ENDOSCOPIC ULTRASOUND (EUS);  Surgeon: DMilus Banister MD;  [facility];  Service: Endoscopy;  Laterality: N/A;   EXAMINATION UNDER ANESTHESIA  08/25/2014   Procedure: EXAM UNDER ANESTHESIA;  Surgeon: SMichael Boston MD;  [facility];  Service: General;;   eye reattachment and ear reattachment  Right 08[birth year]/1969   FLEXIBLE SIGMOIDOSCOPY N/A 03/24/2014   Procedure: FLEXIBLE SIGMOIDOSCOPY;  Surgeon: SAdin Hector MD;  [facility];  Service: General;  Laterality: N/A;   HIP FRACTURE SURGERY Left  age 6 on [birth year]1970   ILEOSTOMY     ILEOSTOMY CLOSURE N/A 08/25/2014   Procedure: TAKEDOWN OF LOOP ILEOSTOMY ;  Surgeon: Michael Boston, MD;  [facility];  Service: General;  Laterality: N/A;   INSERTION OF MESH N/A 04/11/2017   Procedure: INSERTION OF MESH ;  Surgeon: Michael Boston, MD;  [facility];  Service: General;  Laterality: N/A;  BILATERAL TAP BLOCK   THULIUM LASER TURP (TRANSURETHRAL RESECTION OF PROSTATE) N/A 04/04/2022    Procedure: THULIUM LASER TURP (TRANSURETHRAL RESECTION OF PROSTATE);  Surgeon: Festus Aloe, MD;  Location: [facility];  Service: Urology;  Laterality: N/A;   TOTAL HIP ARTHROPLASTY Left 11/19/2015   Procedure: LEFT TOTAL HIP ARTHROPLASTY ANTERIOR APPROACH;  Surgeon: Mcarthur Rossetti, MD;  [facility];  Service: Orthopedics;  Laterality: Left;   VENTRAL HERNIA REPAIR N/A 04/11/2017   Procedure: LAPAROSCOPIC INCARCERATED PERISTOMAL AND INCARCERATED INCISIONAL HERNIA X2;  Surgeon: Michael Boston, MD;  [facility];  Service: General;  Laterality: N/A;  BILATERAL TAP BLOCK    SH: Social History   Tobacco Use   Smoking status: Former    Types: Cigars   Smokeless tobacco: Never  Vaping Use   Vaping Use: Never used  Substance Use Topics   Alcohol use: No    Comment: quit years ago   Drug use: No    ROS: All other review of systems were reviewed and are negative except what is noted above in HPI  PE: BP 138/86   Pulse 64  GENERAL APPEARANCE:  Well appearing, well developed, well nourished, NAD HEENT:  Atraumatic, normocephalic NECK:  Supple. Trachea midline ABDOMEN:  Non-tender, colostomy bag intact EXTREMITIES:  Moves all extremities well, NEUROLOGIC:  Alert and oriented x 3, normal gait  Results: Laboratory Data: Lab Results  Component Value Date   WBC 8.1 03/14/2022   HGB 15.3 04/04/2022   HCT 45.0 04/04/2022   MCV 89 03/14/2022   PLT 330 03/14/2022    Lab Results  Component Value Date   CREATININE 0.80 04/04/2022    No results found for: "PSA"  No results found for: "TESTOSTERONE"  Lab Results  Component Value Date   HGBA1C 5.7 (H) 03/14/2022    Urinalysis    Component Value Date/Time   COLORURINE YELLOW 04/05/2014 0506   APPEARANCEUR Clear 02/06/2022 0953   LABSPEC 1.025 04/05/2014 0506   PHURINE 6.5 04/05/2014 0506   GLUCOSEU Negative 02/06/2022 0953   HGBUR NEGATIVE 04/05/2014 0506   BILIRUBINUR Negative  02/06/2022 0953   KETONESUR NEGATIVE 04/05/2014 0506   PROTEINUR Negative 02/06/2022 0953   PROTEINUR NEGATIVE 04/05/2014 0506   UROBILINOGEN 0.2 04/05/2014 0506   NITRITE Negative 02/06/2022 0953   NITRITE NEGATIVE 04/05/2014 0506   LEUKOCYTESUR Negative 02/06/2022 0953    Lab Results  Component Value Date   LABMICR Comment 02/06/2022    Pertinent Imaging: Results for orders placed during the hospital encounter of 10/10/21  DG Abd 1 View  Narrative CLINICAL DATA:  Evaluate for kidney stones.  No pain.  EXAM: ABDOMEN - 1 VIEW  COMPARISON:  None.  FINDINGS: Both kidneys are partially obscured by bowel contents. Multiple tiny high attenuation foci are seen in the bowel contents of the proximal transverse colon which limits evaluation for tiny stones in the right kidney. There appears to be a 9 mm stone in the lower pole of the right kidney. The remainder of the high attenuation foci over the right kidney and right upper quadrant are favored  to be bowel contents. No definite left-sided stones. Calcifications in the pelvis are favored to be phleboliths. No ureteral or bladder stones definitely identified. No other abnormalities.  IMPRESSION: 1. Evaluation of the kidneys is limited due to overlying bowel contents is above. There appears to be a 9 mm stone in the lower pole of the right kidney. No other definitive stones identified.   Electronically Signed By: Dorise Bullion III M.D. On: 10/10/2021 18:52  No results found for this or any previous visit.  No results found for this or any previous visit.  No results found for this or any previous visit.  No results found for this or any previous visit.  No results found for this or any previous visit.  No results found for this or any previous visit.  No results found for this or any previous visit.  No results found for this or any previous visit (from the past 24 hour(s)).

## 2022-04-27 ENCOUNTER — Telehealth: Payer: Self-pay

## 2022-04-27 LAB — URINE CULTURE

## 2022-04-27 NOTE — Telephone Encounter (Signed)
-----   Message from Reynaldo Minium, [place] sent at 04/27/2022 11:23 AM EDT ----- Please let pt know his urine cx is negative. No need for antibx ----- Message ----- From: Interface, [facility] Lab Results In Sent: 04/25/2022  11:32 AM EDT To: Reynaldo Minium, PA-C

## 2022-04-27 NOTE — Telephone Encounter (Signed)
Made patient aware that his urine cx was negative and there no need for antibx. Patient voiced understanding.

## 2022-04-28 ENCOUNTER — Telehealth: Payer: Self-pay

## 2022-04-28 NOTE — Telephone Encounter (Signed)
Patient states he has passed a few clots last night and the haematuria has since cleared up.  He wanted to see if he needed to be seen in office.  Verbal from [place] as long as bleeding has cleared and he is not having any issues voiding he is ok to f/u as scheduled, any issues voiding over the weekend he needs to be checked out at ER or urgent care.  Patient is aware and voiced understanding.

## 2022-05-02 ENCOUNTER — Inpatient Hospital Stay: Payer: No Typology Code available for payment source | Attending: Hematology

## 2022-05-02 DIAGNOSIS — C20 Malignant neoplasm of rectum: Secondary | ICD-10-CM | POA: Diagnosis not present

## 2022-05-02 LAB — CBC WITH DIFFERENTIAL/PLATELET
Abs Immature Granulocytes: 0.02 10*3/uL (ref 0.00–0.07)
Basophils Absolute: 0 10*3/uL (ref 0.0–0.1)
Basophils Relative: 1 %
Eosinophils Absolute: 0.1 10*3/uL (ref 0.0–0.5)
Eosinophils Relative: 2 %
HCT: 41 % (ref 39.0–52.0)
Hemoglobin: 13.6 g/dL (ref 13.0–17.0)
Immature Granulocytes: 0 %
Lymphocytes Relative: 19 %
Lymphs Abs: 1.4 10*3/uL (ref 0.7–4.0)
MCH: 29.9 pg (ref 26.0–34.0)
MCHC: 33.2 g/dL (ref 30.0–36.0)
MCV: 90.1 fL (ref 80.0–100.0)
Monocytes Absolute: 0.6 10*3/uL (ref 0.1–1.0)
Monocytes Relative: 8 %
Neutro Abs: 5.2 10*3/uL (ref 1.7–7.7)
Neutrophils Relative %: 70 %
Platelets: 294 10*3/uL (ref 150–400)
RBC: 4.55 MIL/uL (ref 4.22–5.81)
RDW: 14 % (ref 11.5–15.5)
WBC: 7.3 10*3/uL (ref 4.0–10.5)
nRBC: 0 % (ref 0.0–0.2)

## 2022-05-02 LAB — COMPREHENSIVE METABOLIC PANEL
ALT: 27 U/L (ref 0–44)
AST: 16 U/L (ref 15–41)
Albumin: 3.5 g/dL (ref 3.5–5.0)
Alkaline Phosphatase: 93 U/L (ref 38–126)
Anion gap: 7 (ref 5–15)
BUN: 21 mg/dL (ref 8–23)
CO2: 27 mmol/L (ref 22–32)
Calcium: 8.9 mg/dL (ref 8.9–10.3)
Chloride: 106 mmol/L (ref 98–111)
Creatinine, Ser: 0.9 mg/dL (ref 0.61–1.24)
GFR, Estimated: >60 mL/min (ref >60)
Glucose, Bld: 101 mg/dL — ABNORMAL HIGH (ref 70–99)
Potassium: 4.2 mmol/L (ref 3.5–5.1)
Sodium: 140 mmol/L (ref 135–145)
Total Bilirubin: 0.7 mg/dL (ref 0.3–1.2)
Total Protein: 6.5 g/dL (ref 6.5–8.1)

## 2022-05-03 DIAGNOSIS — Z933 Colostomy status: Secondary | ICD-10-CM | POA: Diagnosis not present

## 2022-05-03 DIAGNOSIS — K432 Incisional hernia without obstruction or gangrene: Secondary | ICD-10-CM | POA: Diagnosis not present

## 2022-05-03 DIAGNOSIS — K435 Parastomal hernia without obstruction or  gangrene: Secondary | ICD-10-CM | POA: Diagnosis not present

## 2022-05-03 DIAGNOSIS — K439 Ventral hernia without obstruction or gangrene: Secondary | ICD-10-CM | POA: Diagnosis not present

## 2022-05-03 DIAGNOSIS — Z9889 Other specified postprocedural states: Secondary | ICD-10-CM | POA: Diagnosis not present

## 2022-05-03 LAB — CEA: CEA: 2.6 ng/mL (ref 0.0–4.7)

## 2022-05-09 ENCOUNTER — Ambulatory Visit (HOSPITAL_COMMUNITY): Payer: Medicare HMO | Admitting: Hematology

## 2022-05-16 ENCOUNTER — Inpatient Hospital Stay: Payer: No Typology Code available for payment source | Admitting: Hematology

## 2022-05-22 ENCOUNTER — Ambulatory Visit (INDEPENDENT_AMBULATORY_CARE_PROVIDER_SITE_OTHER): Payer: No Typology Code available for payment source | Admitting: Urology

## 2022-05-22 ENCOUNTER — Encounter: Payer: Self-pay | Admitting: Urology

## 2022-05-22 VITALS — BP 132/79 | HR 65

## 2022-05-22 DIAGNOSIS — N200 Calculus of kidney: Secondary | ICD-10-CM

## 2022-05-22 DIAGNOSIS — R3912 Poor urinary stream: Secondary | ICD-10-CM | POA: Diagnosis not present

## 2022-05-22 DIAGNOSIS — N401 Enlarged prostate with lower urinary tract symptoms: Secondary | ICD-10-CM

## 2022-05-22 LAB — BLADDER SCAN AMB NON-IMAGING: Scan Result: 2

## 2022-05-22 NOTE — Progress Notes (Signed)
post void residual=2 

## 2022-05-22 NOTE — Progress Notes (Signed)
S/p Thulium LVP in Aug 2023. Doing well. He has a good stream - "knock it out of the park". He has no urgency. Nocturia improved a lot. He has some hematuria when he picked up a grand kid which cleared.    A/P;  1) BPH - stable post-op. F/u 4 mo with PSA prior   2) kidney stones - check KUB prior to f/u

## 2022-05-23 LAB — MICROSCOPIC EXAMINATION

## 2022-05-23 LAB — URINALYSIS, ROUTINE W REFLEX MICROSCOPIC
Bilirubin, UA: NEGATIVE
Glucose, UA: NEGATIVE
Ketones, UA: NEGATIVE
Nitrite, UA: NEGATIVE
Specific Gravity, UA: 1.02 (ref 1.005–1.030)
Urobilinogen, Ur: 0.2 mg/dL (ref 0.2–1.0)
pH, UA: 5.5 (ref 5.0–7.5)

## 2022-05-25 ENCOUNTER — Telehealth: Payer: Self-pay

## 2022-05-25 ENCOUNTER — Other Ambulatory Visit: Payer: Self-pay | Admitting: Family Medicine

## 2022-05-25 MED ORDER — NIRMATRELVIR/RITONAVIR (PAXLOVID)TABLET: 3.0000 | ORAL_TABLET | Freq: Two times a day (BID) | ORAL | 0 refills | Status: AC

## 2022-05-25 NOTE — Telephone Encounter (Signed)
Covid positive has cough, achy, runny nose and eyes , fatigue all since this Monday , felt warm. Would like to have a prescription called to pharmacy

## 2022-05-25 NOTE — Telephone Encounter (Signed)
Patient has been made aware of his prescription .

## 2022-06-30 ENCOUNTER — Other Ambulatory Visit: Payer: Self-pay | Admitting: Family Medicine

## 2022-07-21 ENCOUNTER — Encounter (HOSPITAL_COMMUNITY): Payer: Self-pay | Admitting: *Deleted

## 2022-07-21 ENCOUNTER — Emergency Department (HOSPITAL_COMMUNITY)
Admission: EM | Admit: 2022-07-21 | Discharge: 2022-07-22 | Disposition: A | Discharge status: Home / Self Care | Payer: No Typology Code available for payment source | Attending: Emergency Medicine | Admitting: Emergency Medicine

## 2022-07-21 ENCOUNTER — Other Ambulatory Visit: Payer: Self-pay

## 2022-07-21 ENCOUNTER — Emergency Department (HOSPITAL_COMMUNITY): Payer: No Typology Code available for payment source

## 2022-07-21 DIAGNOSIS — Z7982 Long term (current) use of aspirin: Secondary | ICD-10-CM | POA: Insufficient documentation

## 2022-07-21 DIAGNOSIS — R7309 Other abnormal glucose: Secondary | ICD-10-CM

## 2022-07-21 DIAGNOSIS — Z79899 Other long term (current) drug therapy: Secondary | ICD-10-CM | POA: Diagnosis not present

## 2022-07-21 DIAGNOSIS — Z85048 Personal history of other malignant neoplasm of rectum, rectosigmoid junction, and anus: Secondary | ICD-10-CM | POA: Insufficient documentation

## 2022-07-21 DIAGNOSIS — R1031 Right lower quadrant pain: Secondary | ICD-10-CM | POA: Diagnosis present

## 2022-07-21 DIAGNOSIS — N201 Calculus of ureter: Secondary | ICD-10-CM | POA: Diagnosis not present

## 2022-07-21 DIAGNOSIS — N1330 Unspecified hydronephrosis: Secondary | ICD-10-CM | POA: Diagnosis not present

## 2022-07-21 DIAGNOSIS — I10 Essential (primary) hypertension: Secondary | ICD-10-CM | POA: Diagnosis not present

## 2022-07-21 DIAGNOSIS — N132 Hydronephrosis with renal and ureteral calculous obstruction: Secondary | ICD-10-CM | POA: Insufficient documentation

## 2022-07-21 DIAGNOSIS — R739 Hyperglycemia, unspecified: Secondary | ICD-10-CM | POA: Diagnosis not present

## 2022-07-21 DIAGNOSIS — N200 Calculus of kidney: Secondary | ICD-10-CM | POA: Diagnosis not present

## 2022-07-21 DIAGNOSIS — K5730 Diverticulosis of large intestine without perforation or abscess without bleeding: Secondary | ICD-10-CM | POA: Diagnosis not present

## 2022-07-21 MED ORDER — ONDANSETRON 8 MG PO TBDP
8.0000 mg | ORAL_TABLET | Freq: Once | ORAL | Status: AC
  Administered 2022-07-22: 8 mg via ORAL
  Filled 2022-07-21: qty 1

## 2022-07-21 MED ORDER — LACTATED RINGERS IV BOLUS
1000.0000 mL | Freq: Once | INTRAVENOUS | Status: AC
  Administered 2022-07-22: 1000 mL via INTRAVENOUS

## 2022-07-21 MED ORDER — MORPHINE SULFATE (PF) 4 MG/ML IV SOLN
4.0000 mg | Freq: Once | INTRAVENOUS | Status: AC
  Administered 2022-07-22: 4 mg via INTRAVENOUS
  Filled 2022-07-21: qty 1

## 2022-07-21 NOTE — ED Triage Notes (Signed)
Pt with sudden RLQ pain that is sharp, blood noted to urine per pt.  Denies any N/V/D. Pt with colostomy to left lower abd.

## 2022-07-21 NOTE — ED Provider Notes (Signed)
[facility] EMERGENCY DEPARTMENT Provider Note   CSN: [id number] Arrival date & time: 07/21/22  2248     History {Add pertinent medical, surgical, social history, OB history to HPI:1} Chief Complaint  Patient presents with   Abdominal Pain    [name] is a 66 y.o. male.  The history is provided by the patient.  Abdominal Pain He has history of hypertension, rectal cancer with colostomy, GERD and comes in with sudden onset this evening of severe, sharp right lower quadrant pain without radiation.  There is no associated nausea, vomiting.  He denies fever or chills.  He has noted some hematuria today, but denies urinary urgency or frequency or hesitancy.  He has never had pain like this before.  He does have a colostomy in place and has parastomal hernias on both the right and left lower quadrants.   Home Medications Prior to Admission medications   Medication Sig Start Date End Date Taking? Authorizing Provider  amLODipine (NORVASC) 5 MG tablet Take 1 tablet (5 mg total) by mouth daily. 03/14/22   Coral Spikes, DO  aspirin EC 81 MG tablet Take 81 mg by mouth daily.    [provider]  baclofen (LIORESAL) 10 MG tablet Take 1 tablet (10 mg total) by mouth at bedtime as needed for muscle spasms. For leg cramping Patient taking differently: Take 10 mg by mouth at bedtime. For leg cramping 03/14/22   Coral Spikes, DO  Beta Carotene (VITAMIN A) 25000 UNIT capsule Take 25,000 Units by mouth daily.    [provider]  co-enzyme Q-10 30 MG capsule Take 30 mg by mouth daily.    [provider]  LUTEIN PO Take 1 tablet by mouth 2 (two) times daily.     [provider]  metoprolol tartrate (LOPRESSOR) 25 MG tablet Take 1 tablet (25 mg total) by mouth 2 (two) times daily. 03/14/22   Coral Spikes, DO  Multiple Vitamins-Minerals (ONE DAILY FOR MEN 50+ ADVANCED PO) Take 1 tablet by mouth daily.     [provider]  omeprazole (PRILOSEC) 40 MG  capsule Take 1 capsule (40 mg total) by mouth daily. 09/14/21   Cook, Barnie Del, DO  OVER THE COUNTER MEDICATION Take 1 tablet by mouth 2 (two) times daily. Vinegar tablet by mouth twice a day     [provider]  rosuvastatin (CRESTOR) 10 MG tablet TAKE 1 TABLET BY MOUTH EVERY DAY 06/30/22   Thersa Salt G, DO  tamsulosin (FLOMAX) 0.4 MG CAPS capsule Take 1 capsule (0.4 mg total) by mouth daily after supper. 12/19/21   Festus Aloe, MD  Turmeric 500 MG CAPS Take 500 mg by mouth 2 (two) times daily.     [provider]  vitamin B-12 (CYANOCOBALAMIN) 500 MCG tablet Take 1,000 mcg by mouth daily.    [provider]  vitamin C (ASCORBIC ACID) 500 MG tablet Take 500 mg by mouth 2 (two) times daily.    [provider]      Allergies    Patient has no known allergies.    Review of Systems   Review of Systems  Gastrointestinal:  Positive for abdominal pain.  All other systems reviewed and are negative.   Physical Exam Updated Vital Signs BP (!) 174/92   Pulse (!) 58   Temp (!) 97.5 F (36.4 C) (Oral)   Resp 20   Ht '5\' 5"'$  (1.651 m)   Wt 88 kg   SpO2 97%  BMI 32.28 kg/m  Physical Exam Vitals and nursing note reviewed.   66 year old male, resting comfortably and in no acute distress. Vital signs are significant for elevated blood pressure and borderline slow heart rate. Oxygen saturation is 97%, which is normal. Head is normocephalic and atraumatic. PERRLA, EOMI. Oropharynx is clear. Neck is nontender and supple without adenopathy or JVD. Back is nontender in the midline.  There is mild right CVA tenderness. Lungs are clear without rales, wheezes, or rhonchi. Chest is nontender. Heart has regular rate and rhythm without murmur. Abdomen is soft, flat, with moderate right lower quadrant tenderness with lesser degrees of tenderness present throughout the right side of the abdomen.  There is no rebound or guarding.  Colostomy is present on the left.   Right and left lower quadrant abdominal wall hernias are noted and are easily reducible. Extremities have no cyanosis or edema, full range of motion is present. Skin is warm and dry without rash. Neurologic: Mental status is normal, cranial nerves are intact, moves all extremities equally.  ED Results / Procedures / Treatments   Labs (all labs ordered are listed, but only abnormal results are displayed) Labs Reviewed - No data to display  EKG None  Radiology No results found.  Procedures Procedures  {Document cardiac monitor, telemetry assessment procedure when appropriate:1}  Medications Ordered in ED Medications - No data to display  ED Course/ Medical Decision Making/ A&P                           Medical Decision Making  Right lower quadrant pain with hematuria strongly suggestive of ureterolithiasis and renal colic.  Differential diagnosis includes, but is not limited to, appendicitis, diverticulitis, strangulated hernia.  I have ordered laboratory workup with CBC, comprehensive metabolic panel, lipase, urinalysis and I have ordered a renal stone protocol CT of abdomen and pelvis.  I have ordered IV fluids, morphine, ondansetron.  I have reviewed his old records, and CT of the abdomen and pelvis on 06/10/2020 did show a 4 mm calculus in the lower pole of the right kidney.  {Document critical care time when appropriate:1} {Document review of labs and clinical decision tools ie heart score, Chads2Vasc2 etc:1}  {Document your independent review of radiology images, and any outside records:1} {Document your discussion with family members, caretakers, and with consultants:1} {Document social determinants of health affecting pt's care:1} {Document your decision making why or why not admission, treatments were needed:1} Final Clinical Impression(s) / ED Diagnoses Final diagnoses:  None    Rx / DC Orders ED Discharge Orders     None

## 2022-07-22 LAB — CBC WITH DIFFERENTIAL/PLATELET
Abs Immature Granulocytes: 0.03 10*3/uL (ref 0.00–0.07)
Basophils Absolute: 0 10*3/uL (ref 0.0–0.1)
Basophils Relative: 0 %
Eosinophils Absolute: 0.1 10*3/uL (ref 0.0–0.5)
Eosinophils Relative: 1 %
HCT: 40 % (ref 39.0–52.0)
Hemoglobin: 13 g/dL (ref 13.0–17.0)
Immature Granulocytes: 0 %
Lymphocytes Relative: 10 %
Lymphs Abs: 1 10*3/uL (ref 0.7–4.0)
MCH: 29 pg (ref 26.0–34.0)
MCHC: 32.5 g/dL (ref 30.0–36.0)
MCV: 89.3 fL (ref 80.0–100.0)
Monocytes Absolute: 0.6 10*3/uL (ref 0.1–1.0)
Monocytes Relative: 6 %
Neutro Abs: 8.3 10*3/uL — ABNORMAL HIGH (ref 1.7–7.7)
Neutrophils Relative %: 83 %
Platelets: 302 10*3/uL (ref 150–400)
RBC: 4.48 MIL/uL (ref 4.22–5.81)
RDW: 14 % (ref 11.5–15.5)
WBC: 10 10*3/uL (ref 4.0–10.5)
nRBC: 0 % (ref 0.0–0.2)

## 2022-07-22 LAB — COMPREHENSIVE METABOLIC PANEL
ALT: 28 U/L (ref 0–44)
AST: 21 U/L (ref 15–41)
Albumin: 3.3 g/dL — ABNORMAL LOW (ref 3.5–5.0)
Alkaline Phosphatase: 89 U/L (ref 38–126)
Anion gap: 7 (ref 5–15)
BUN: 24 mg/dL — ABNORMAL HIGH (ref 8–23)
CO2: 25 mmol/L (ref 22–32)
Calcium: 8.8 mg/dL — ABNORMAL LOW (ref 8.9–10.3)
Chloride: 105 mmol/L (ref 98–111)
Creatinine, Ser: 0.91 mg/dL (ref 0.61–1.24)
GFR, Estimated: >60 mL/min (ref >60)
Glucose, Bld: 131 mg/dL — ABNORMAL HIGH (ref 70–99)
Potassium: 3.6 mmol/L (ref 3.5–5.1)
Sodium: 137 mmol/L (ref 135–145)
Total Bilirubin: 0.4 mg/dL (ref 0.3–1.2)
Total Protein: 6.4 g/dL — ABNORMAL LOW (ref 6.5–8.1)

## 2022-07-22 LAB — LIPASE, BLOOD: Lipase: 39 U/L (ref 11–51)

## 2022-07-22 LAB — URINALYSIS, ROUTINE W REFLEX MICROSCOPIC
Bacteria, UA: NONE SEEN
Bilirubin Urine: NEGATIVE
Glucose, UA: NEGATIVE mg/dL
Ketones, ur: NEGATIVE mg/dL
Leukocytes,Ua: NEGATIVE
Nitrite: NEGATIVE
Protein, ur: NEGATIVE mg/dL
RBC / HPF: >50 RBC/hpf — ABNORMAL HIGH (ref 0–5)
Specific Gravity, Urine: 1.014 (ref 1.005–1.030)
pH: 6 (ref 5.0–8.0)

## 2022-07-22 MED ORDER — OXYCODONE-ACETAMINOPHEN 5-325 MG PO TABS: 1.0000 | ORAL_TABLET | ORAL | 0 refills | Status: DC | PRN

## 2022-07-22 MED ORDER — MORPHINE SULFATE (PF) 4 MG/ML IV SOLN
4.0000 mg | Freq: Once | INTRAVENOUS | Status: AC
  Administered 2022-07-22: 4 mg via INTRAVENOUS
  Filled 2022-07-22: qty 1

## 2022-07-22 MED ORDER — OXYCODONE HCL 5 MG PO TABS: 5.0000 mg | ORAL_TABLET | ORAL | 0 refills | Status: DC | PRN

## 2022-07-22 MED ORDER — ONDANSETRON 8 MG PO TBDP: 8.0000 mg | ORAL_TABLET | Freq: Three times a day (TID) | ORAL | 0 refills | Status: DC | PRN

## 2022-07-22 NOTE — Discharge Instructions (Signed)
Drink plenty of fluids.  You may take acetaminophen as needed for pain.  If you are not getting adequate pain relief, add oxycodone.  Make sure to follow-up with your urologist as soon as possible.  Return to the emergency department if pain is not being adequately controlled at home, or if at any point you start running a fever.

## 2022-07-22 NOTE — ED Notes (Addendum)
Gave patient a prepack of oxycodone, witnessed by [employer]

## 2022-07-24 ENCOUNTER — Ambulatory Visit (INDEPENDENT_AMBULATORY_CARE_PROVIDER_SITE_OTHER): Payer: No Typology Code available for payment source | Admitting: Urology

## 2022-07-24 ENCOUNTER — Encounter: Payer: Self-pay | Admitting: Urology

## 2022-07-24 ENCOUNTER — Inpatient Hospital Stay (HOSPITAL_COMMUNITY)
Admission: RE | Admit: 2022-07-24 | Discharge: 2022-07-24 | Disposition: A | Discharge status: Home / Self Care | Payer: No Typology Code available for payment source | Source: Ambulatory Visit

## 2022-07-24 VITALS — BP 147/94 | HR 71

## 2022-07-24 DIAGNOSIS — N200 Calculus of kidney: Secondary | ICD-10-CM | POA: Diagnosis not present

## 2022-07-24 DIAGNOSIS — N201 Calculus of ureter: Secondary | ICD-10-CM | POA: Diagnosis not present

## 2022-07-24 LAB — URINALYSIS, ROUTINE W REFLEX MICROSCOPIC
Bilirubin, UA: NEGATIVE
Glucose, UA: NEGATIVE
Ketones, UA: NEGATIVE
Nitrite, UA: NEGATIVE
Protein,UA: NEGATIVE
RBC, UA: NEGATIVE
Specific Gravity, UA: 1.015 (ref 1.005–1.030)
Urobilinogen, Ur: 0.2 mg/dL (ref 0.2–1.0)
pH, UA: 5.5 (ref 5.0–7.5)

## 2022-07-24 LAB — MICROSCOPIC EXAMINATION: Bacteria, UA: NONE SEEN

## 2022-07-24 NOTE — Pre-Procedure Instructions (Signed)
Attempted pre-op phone call. Left VM for him to call us back. 

## 2022-07-24 NOTE — Progress Notes (Signed)
07/24/2022 9:28 AM   [name] Sep 23, 1955 [id number]  Referring provider: Coral Spikes, DO [place],  [place] [id number]  No chief complaint on file.   HPI:  F/u -    1) BPH - He underwent thulium LVP prostate Aug 2023. A CT October 2021 revealed about a 40 g prostate. He saw Dr. Matilde Sprang for urinary retention p above pelvic surgery which resolved. He had a weak stream, intermittent stream, frequency. AUASS = 23.    He started daily tadalafil in Feb 2023 with no change in LUTS and stopped it. He has weak st and nocturia. Intermittent flow. AUASS = 26. PVR is 10 ml. UA clear.    2) kidney stone - CT October 2021 revealed a 4 mm right lower pole stone. No h/o of stones or stone passage. Feb 2023 KUB with a 9 mm RLP stone.     3) ED - he does not get erection for several years. Tried sildenafil but yrs ago. Declined VED. He has a good libido. Daily tadalafil did not help.    4) PSA elevation-patient with a PSA of 2.1 in 2018 which rose to 4.3 in January 2023. No prior biopsy. He had a left hip replacement (scatter imaging signal). No rectal access from prior LAR/ileostomy in 2015 then a colostomy (h/o colon ca).     His April 2023 PSA normalized to 3.3.    Today, seen for the above. He is voiding well. AUASS down to 8. He developed right flank pain and went to ED 07/22/2022. He had a right proximal 6 mm stone and hydro. It is visible on the scout.  Wbc was 10 and cr 0.91. UA today with no bacteria, no rbc, 6-10 wbc.   Pain better today. Hasn't seen a stone pass. No blood thinners or heart issues. No fevers.    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  PMH: Past Medical History:  Diagnosis Date   Arthritis    hands and shoulders (left shoulder is worse than right)   Aspiration pneumonia ([place]) 03/24/2014   after surgery for colostomy   BPH (benign prostatic hyperplasia)    Colostomy in place [facility])    Complication of anesthesia  03/24/2014   aspiration pneumonia after surgery for colostomy, no problem with  surgeries since then   Difficulty urinating    Dyspnea    @ times due to hernias, seeing [facility] for  surgical consultation 04-20-2022, can lie flat   GERD (gastroesophageal reflux disease)    Hypertension    Ileostomy present ([place])    Incisional hernia    right abdomen   Parastomal hernia left    Rectal cancer, uT2uN0, s/p LAR/ileostomy 03/20/2014 colostomy Aug2015 12/25/2013   TCS MAY 2015    Restless leg    Right inguinal hernia    Shortening, leg, congenital    Left   Skin irritation    Surrounding ostomy site    Surgical History: Past Surgical History:  Procedure Laterality Date   COLON RESECTION N/A 03/24/2014   Procedure: Diagnostic  laparotomy, takedown of coloanal anastamosis, end colostomy, with wound vac;  Surgeon: Adin Hector, MD;  Location: [facility];  Service: General;  Laterality: N/A;   COLONOSCOPY N/A 12/22/2013   SLF: 1. Normal mucosa in the terminal ilium. 2. Moderate diverticulosis in the transverse colon, descending colon, and sigmoid colon. 3. Rectal bleeding due ot rectal mass and hemorrhoids 4. Moderate sized internal hemorrhoids.  COLONOSCOPY N/A 01/19/2015   Procedure: COLONOSCOPY;  Surgeon: Danie Binder, MD;  Location: [facility];  Service: Endoscopy;  Laterality: N/A;  1215   COLONOSCOPY N/A 03/22/2018   Procedure: COLONOSCOPY;  Surgeon: Danie Binder, MD;  Location: [facility];  Service: Endoscopy;  Laterality: N/A;  2:15pm   COLOSTOMY     03-24-2014   ESOPHAGOGASTRODUODENOSCOPY N/A 12/22/2013   SLF: Probable Candia esophagitis2. Probable Barretts esphagus 3. Mild gastritis.    EUS N/A 01/01/2014   Procedure: LOWER ENDOSCOPIC ULTRASOUND (EUS);  Surgeon: Milus Banister, MD;  Location: [facility];  Service: Endoscopy;  Laterality: N/A;   EXAMINATION UNDER ANESTHESIA  08/25/2014   Procedure: EXAM UNDER ANESTHESIA;  Surgeon: Michael Boston, MD;  Location: [facility];   Service: General;;   eye reattachment and ear reattachment  Right 08/21/1969   FLEXIBLE SIGMOIDOSCOPY N/A 03/24/2014   Procedure: FLEXIBLE SIGMOIDOSCOPY;  Surgeon: Adin Hector, MD;  Location: [facility];  Service: General;  Laterality: N/A;   HIP FRACTURE SURGERY Left age 29 on 04-06-1970   ILEOSTOMY     ILEOSTOMY CLOSURE N/A 08/25/2014   Procedure: TAKEDOWN OF LOOP ILEOSTOMY ;  Surgeon: Michael Boston, MD;  Location: [facility];  Service: General;  Laterality: N/A;   INSERTION OF MESH N/A 04/11/2017   Procedure: INSERTION OF MESH ;  Surgeon: Michael Boston, MD;  Location: [facility];  Service: General;  Laterality: N/A;  BILATERAL TAP BLOCK   THULIUM LASER TURP (TRANSURETHRAL RESECTION OF PROSTATE) N/A 04/04/2022   Procedure: THULIUM LASER TURP (TRANSURETHRAL RESECTION OF PROSTATE);  Surgeon: Festus Aloe, MD;  Location: [facility];  Service: Urology;  Laterality: N/A;   TOTAL HIP ARTHROPLASTY Left 11/19/2015   Procedure: LEFT TOTAL HIP ARTHROPLASTY ANTERIOR APPROACH;  Surgeon: Mcarthur Rossetti, MD;  Location: [facility];  Service: Orthopedics;  Laterality: Left;   VENTRAL HERNIA REPAIR N/A 04/11/2017   Procedure: LAPAROSCOPIC INCARCERATED PERISTOMAL AND INCARCERATED INCISIONAL HERNIA X2;  Surgeon: Michael Boston, MD;  Location: [facility];  Service: General;  Laterality: N/A;  BILATERAL TAP BLOCK    Home Medications:  Allergies as of 07/24/2022   No Known Allergies      Medication List        Accurate as of July 24, 2022  9:28 AM. If you have any questions, ask your nurse or doctor.          amLODipine 5 MG tablet Commonly known as: NORVASC Take 1 tablet (5 mg total) by mouth daily.   ascorbic acid 500 MG tablet Commonly known as: VITAMIN C Take 500 mg by mouth 2 (two) times daily.   aspirin EC 81 MG tablet Take 81 mg by mouth daily.   baclofen 10 MG tablet Commonly known as: LIORESAL Take 1 tablet (10 mg total) by mouth at bedtime as needed for muscle spasms.  For leg cramping What changed: when to take this   co-enzyme Q-10 30 MG capsule Take 30 mg by mouth daily.   cyanocobalamin 500 MCG tablet Commonly known as: VITAMIN B12 Take 1,000 mcg by mouth daily.   LUTEIN PO Take 1 tablet by mouth 2 (two) times daily.   metoprolol tartrate 25 MG tablet Commonly known as: LOPRESSOR Take 1 tablet (25 mg total) by mouth 2 (two) times daily.   omeprazole 40 MG capsule Commonly known as: PRILOSEC Take 1 capsule (40 mg total) by mouth daily.   ondansetron 8 MG disintegrating tablet Commonly known as: ZOFRAN-ODT Take 1 tablet (8 mg total)  by mouth every 8 (eight) hours as needed for nausea or vomiting.   ONE DAILY FOR MEN 50+ ADVANCED PO Take 1 tablet by mouth daily.   OVER THE COUNTER MEDICATION Take 1 tablet by mouth 2 (two) times daily. Vinegar tablet by mouth twice a day   oxyCODONE 5 MG immediate release tablet Commonly known as: Roxicodone Take 1 tablet (5 mg total) by mouth every 4 (four) hours as needed for severe pain.   oxyCODONE-acetaminophen 5-325 MG tablet Commonly known as: Percocet Take 1 tablet by mouth every 4 (four) hours as needed for moderate pain.   rosuvastatin 10 MG tablet Commonly known as: CRESTOR TAKE 1 TABLET BY MOUTH EVERY DAY   tamsulosin 0.4 MG Caps capsule Commonly known as: FLOMAX Take 1 capsule (0.4 mg total) by mouth daily after supper.   Turmeric 500 MG Caps Take 500 mg by mouth 2 (two) times daily.   vitamin A 25000 UNIT capsule Take 25,000 Units by mouth daily.        Allergies: No Known Allergies  Family History: Family History  Problem Relation Age of Onset   Colon cancer Sister    Emphysema Mother    Heart attack Brother    Colon polyps Neg Hx     Social History:  reports that he has quit smoking. His smoking use included cigars. He has never used smokeless tobacco. He reports that he does not drink alcohol and does not use drugs.   Physical Exam: BP (!) 147/94   Pulse 71    Constitutional:  Alert and oriented, No acute distress. HEENT: [place] AT, moist mucus membranes.  Trachea midline, no masses. Cardiovascular: RRR Respiratory: Normal respiratory effort, no increased work of breathing. GI: Abdomen is soft, nontender, nondistended, no abdominal masses GU: No CVA tenderness Skin: No rashes, bruises or suspicious lesions. Neurologic: Grossly intact, no focal deficits, moving all 4 extremities. Psychiatric: Normal mood and affect.  Laboratory Data: Lab Results  Component Value Date   WBC 10.0 07/22/2022   HGB 13.0 07/22/2022   HCT 40.0 07/22/2022   MCV 89.3 07/22/2022   PLT 302 07/22/2022    Lab Results  Component Value Date   CREATININE 0.91 07/22/2022    No results found for: "PSA"  No results found for: "TESTOSTERONE"  Lab Results  Component Value Date   HGBA1C 5.7 (H) 03/14/2022    Urinalysis    Component Value Date/Time   COLORURINE YELLOW 07/22/2022 0115   APPEARANCEUR CLEAR 07/22/2022 0115   APPEARANCEUR Hazy (A) 05/22/2022 1151   LABSPEC 1.014 07/22/2022 0115   PHURINE 6.0 07/22/2022 0115   GLUCOSEU NEGATIVE 07/22/2022 0115   HGBUR LARGE (A) 07/22/2022 0115   BILIRUBINUR NEGATIVE 07/22/2022 0115   BILIRUBINUR Negative 05/22/2022 1151   KETONESUR NEGATIVE 07/22/2022 0115   PROTEINUR NEGATIVE 07/22/2022 0115   UROBILINOGEN 0.2 04/05/2014 0506   NITRITE NEGATIVE 07/22/2022 0115   LEUKOCYTESUR NEGATIVE 07/22/2022 0115    Lab Results  Component Value Date   LABMICR See below: 05/22/2022   WBCUA 6-10 (A) 05/22/2022   LABEPIT 0-10 05/22/2022   MUCUS Present 04/25/2022   BACTERIA NONE SEEN 07/22/2022    Pertinent Imaging: CT reviewed.  Results for orders placed during the hospital encounter of 10/10/21  Prior KUB -- DG Abd 1 View  Narrative CLINICAL DATA:  Evaluate for kidney stones.  No pain.  EXAM: ABDOMEN - 1 VIEW  COMPARISON:  None.  FINDINGS: Both kidneys are partially obscured by bowel contents. Multiple  tiny high  attenuation foci are seen in the bowel contents of the proximal transverse colon which limits evaluation for tiny stones in the right kidney. There appears to be a 9 mm stone in the lower pole of the right kidney. The remainder of the high attenuation foci over the right kidney and right upper quadrant are favored to be bowel contents. No definite left-sided stones. Calcifications in the pelvis are favored to be phleboliths. No ureteral or bladder stones definitely identified. No other abnormalities.  IMPRESSION: 1. Evaluation of the kidneys is limited due to overlying bowel contents is above. There appears to be a 9 mm stone in the lower pole of the right kidney. No other definitive stones identified.   Electronically Signed By: Dorise Bullion III M.D. On: 10/10/2021 18:52  No valid procedures specified. No results found for this or any previous visit.  Results for orders placed during the hospital encounter of 07/21/22  CT Renal Stone Study  Narrative CLINICAL DATA:  Right lower quadrant pain, right flank pain  EXAM: CT ABDOMEN AND PELVIS WITHOUT CONTRAST  TECHNIQUE: Multidetector CT imaging of the abdomen and pelvis was performed following the standard protocol without IV contrast.  RADIATION DOSE REDUCTION: This exam was performed according to the departmental dose-optimization program which includes automated exposure control, adjustment of the mA and/or kV according to patient size and/or use of iterative reconstruction technique.  COMPARISON:  06/10/2020  FINDINGS: Lower chest: No acute abnormality  Hepatobiliary: No focal hepatic abnormality. Gallbladder unremarkable.  Pancreas: No focal abnormality or ductal dilatation.  Spleen: No focal abnormality.  Normal size.  Adrenals/Urinary Tract: 5 mm proximal right ureteral stone with mild right hydronephrosis and perinephric stranding. Bilateral nephrolithiasis. Adrenal glands and urinary bladder  unremarkable.  Stomach/Bowel: Prior partial colectomy with left lower quadrant ostomy. Colonic diverticulosis.  Vascular/Lymphatic: Aortic atherosclerosis. No evidence of aneurysm or adenopathy.  Reproductive: Prostate enlargement.  Other: No free fluid or free air.  Musculoskeletal: Prior left hip replacement. No acute bony abnormality.  IMPRESSION: 5 mm proximal right ureteral stone with mild right hydronephrosis and perinephric stranding.  Bilateral nephrolithiasis.  Left lower quadrant ostomy.  Colonic diverticulosis.  Aortic atherosclerosis.   Electronically Signed By: Rolm Baptise M.D. On: 07/22/2022 00:12   Assessment & Plan:    1. Right ureteral stone - disc with patient the nature r/b of cont stone passage, off label MET with alpha blocker, URS/HLL/stent and ESWL. He elects to proceed with ESWL. Stone was visible on scout and on prior KUB (when it was in the right lower pole).   He is on low dose ASA but stone is outside the renal shadow.   - Urinalysis, Routine w reflex microscopic  2. BPH - doing well.   No follow-ups on file.  Festus Aloe, MD  [facility]  [address] [place], [place] [address] [phone]

## 2022-07-24 NOTE — H&P (View-Only) (Signed)
07/24/2022 9:28 AM   [name] Sep 23, 1955 [id number]  Referring provider: Coral Spikes, DO [place],  [place] [address]  No chief complaint on file.   HPI:  F/u -    1) BPH - He underwent thulium LVP prostate Aug 2023. A CT October 2021 revealed about a 40 g prostate. He saw Dr. Matilde Sprang for urinary retention p above pelvic surgery which resolved. He had a weak stream, intermittent stream, frequency. AUASS = 23.    He started daily tadalafil in Feb 2023 with no change in LUTS and stopped it. He has weak st and nocturia. Intermittent flow. AUASS = 26. PVR is 10 ml. UA clear.    2) kidney stone - CT October 2021 revealed a 4 mm right lower pole stone. No h/o of stones or stone passage. Feb 2023 KUB with a 9 mm RLP stone.     3) ED - he does not get erection for several years. Tried sildenafil but yrs ago. Declined VED. He has a good libido. Daily tadalafil did not help.    4) PSA elevation-patient with a PSA of 2.1 in 2018 which rose to 4.3 in January 2023. No prior biopsy. He had a left hip replacement (scatter imaging signal). No rectal access from prior LAR/ileostomy in 2015 then a colostomy (h/o colon ca).     His April 2023 PSA normalized to 3.3.    Today, seen for the above. He is voiding well. AUASS down to 8. He developed right flank pain and went to ED 07/22/2022. He had a right proximal 6 mm stone and hydro. It is visible on the scout.  Wbc was 10 and cr 0.91. UA today with no bacteria, no rbc, 6-10 wbc.   Pain better today. Hasn't seen a stone pass. No blood thinners or heart issues. No fevers.    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  PMH: Past Medical History:  Diagnosis Date   Arthritis    hands and shoulders (left shoulder is worse than right)   Aspiration pneumonia ([place]) 03/24/2014   after surgery for colostomy   BPH (benign prostatic hyperplasia)    Colostomy in place [facility])    Complication of anesthesia  03/24/2014   aspiration pneumonia after surgery for colostomy, no problem with  surgeries since then   Difficulty urinating    Dyspnea    @ times due to hernias, seeing [facility] for  surgical consultation 04-20-2022, can lie flat   GERD (gastroesophageal reflux disease)    Hypertension    Ileostomy present ([place])    Incisional hernia    right abdomen   Parastomal hernia left    Rectal cancer, uT2uN0, s/p LAR/ileostomy 03/20/2014 colostomy Aug2015 12/25/2013   TCS MAY 2015    Restless leg    Right inguinal hernia    Shortening, leg, congenital    Left   Skin irritation    Surrounding ostomy site    Surgical History: Past Surgical History:  Procedure Laterality Date   COLON RESECTION N/A 03/24/2014   Procedure: Diagnostic  laparotomy, takedown of coloanal anastamosis, end colostomy, with wound vac;  Surgeon: Adin Hector, MD;  Location: [facility];  Service: General;  Laterality: N/A;   COLONOSCOPY N/A 12/22/2013   SLF: 1. Normal mucosa in the terminal ilium. 2. Moderate diverticulosis in the transverse colon, descending colon, and sigmoid colon. 3. Rectal bleeding due ot rectal mass and hemorrhoids 4. Moderate sized internal hemorrhoids.  COLONOSCOPY N/A 01/19/2015   Procedure: COLONOSCOPY;  Surgeon: Danie Binder, MD;  [facility]: [facility];  Service: Endoscopy;  Laterality: N/A;  1215   COLONOSCOPY N/A 03/22/2018   Procedure: COLONOSCOPY;  Surgeon: Danie Binder, MD;  [facility];  Service: Endoscopy;  Laterality: N/A;  2:15pm   COLOSTOMY     03-24-2014   ESOPHAGOGASTRODUODENOSCOPY N/A 12/22/2013   SLF: Probable Candia esophagitis2. Probable Barretts esphagus 3. Mild gastritis.    EUS N/A 01/01/2014   Procedure: LOWER ENDOSCOPIC ULTRASOUND (EUS);  Surgeon: Milus Banister, MD;  Location: [facility];  Service: Endoscopy;  Laterality: N/A;   EXAMINATION UNDER ANESTHESIA  08/25/2014   Procedure: EXAM UNDER ANESTHESIA;  Surgeon: Michael Boston, MD;  Location: [facility];   Service: General;;   eye reattachment and ear reattachment  Right 08/21/1969   FLEXIBLE SIGMOIDOSCOPY N/A 03/24/2014   Procedure: FLEXIBLE SIGMOIDOSCOPY;  Surgeon: Adin Hector, MD;  Location: [facility];  Service: General;  Laterality: N/A;   HIP FRACTURE SURGERY Left age 29 on 04-06-1970   ILEOSTOMY     ILEOSTOMY CLOSURE N/A 08/25/2014   Procedure: TAKEDOWN OF LOOP ILEOSTOMY ;  Surgeon: Michael Boston, MD;  Location: [facility];  Service: General;  Laterality: N/A;   INSERTION OF MESH N/A 04/11/2017   Procedure: INSERTION OF MESH ;  Surgeon: Michael Boston, MD;  Location: [facility];  Service: General;  Laterality: N/A;  BILATERAL TAP BLOCK   THULIUM LASER TURP (TRANSURETHRAL RESECTION OF PROSTATE) N/A 04/04/2022   Procedure: THULIUM LASER TURP (TRANSURETHRAL RESECTION OF PROSTATE);  Surgeon: Festus Aloe, MD;  Location: [facility];  Service: Urology;  Laterality: N/A;   TOTAL HIP ARTHROPLASTY Left 11/19/2015   Procedure: LEFT TOTAL HIP ARTHROPLASTY ANTERIOR APPROACH;  Surgeon: Mcarthur Rossetti, MD;  Location: [facility];  Service: Orthopedics;  Laterality: Left;   VENTRAL HERNIA REPAIR N/A 04/11/2017   Procedure: LAPAROSCOPIC INCARCERATED PERISTOMAL AND INCARCERATED INCISIONAL HERNIA X2;  Surgeon: Michael Boston, MD;  Location: [facility];  Service: General;  Laterality: N/A;  BILATERAL TAP BLOCK    Home Medications:  Allergies as of 07/24/2022   No Known Allergies      Medication List        Accurate as of July 24, 2022  9:28 AM. If you have any questions, ask your nurse or doctor.          amLODipine 5 MG tablet Commonly known as: NORVASC Take 1 tablet (5 mg total) by mouth daily.   ascorbic acid 500 MG tablet Commonly known as: VITAMIN C Take 500 mg by mouth 2 (two) times daily.   aspirin EC 81 MG tablet Take 81 mg by mouth daily.   baclofen 10 MG tablet Commonly known as: LIORESAL Take 1 tablet (10 mg total) by mouth at bedtime as needed for muscle spasms.  For leg cramping What changed: when to take this   co-enzyme Q-10 30 MG capsule Take 30 mg by mouth daily.   cyanocobalamin 500 MCG tablet Commonly known as: VITAMIN B12 Take 1,000 mcg by mouth daily.   LUTEIN PO Take 1 tablet by mouth 2 (two) times daily.   metoprolol tartrate 25 MG tablet Commonly known as: LOPRESSOR Take 1 tablet (25 mg total) by mouth 2 (two) times daily.   omeprazole 40 MG capsule Commonly known as: PRILOSEC Take 1 capsule (40 mg total) by mouth daily.   ondansetron 8 MG disintegrating tablet Commonly known as: ZOFRAN-ODT Take 1 tablet (8 mg total)  by mouth every 8 (eight) hours as needed for nausea or vomiting.   ONE DAILY FOR MEN 50+ ADVANCED PO Take 1 tablet by mouth daily.   OVER THE COUNTER MEDICATION Take 1 tablet by mouth 2 (two) times daily. Vinegar tablet by mouth twice a day   oxyCODONE 5 MG immediate release tablet Commonly known as: Roxicodone Take 1 tablet (5 mg total) by mouth every 4 (four) hours as needed for severe pain.   oxyCODONE-acetaminophen 5-325 MG tablet Commonly known as: Percocet Take 1 tablet by mouth every 4 (four) hours as needed for moderate pain.   rosuvastatin 10 MG tablet Commonly known as: CRESTOR TAKE 1 TABLET BY MOUTH EVERY DAY   tamsulosin 0.4 MG Caps capsule Commonly known as: FLOMAX Take 1 capsule (0.4 mg total) by mouth daily after supper.   Turmeric 500 MG Caps Take 500 mg by mouth 2 (two) times daily.   vitamin A 25000 UNIT capsule Take 25,000 Units by mouth daily.        Allergies: No Known Allergies  Family History: Family History  Problem Relation Age of Onset   Colon cancer Sister    Emphysema Mother    Heart attack Brother    Colon polyps Neg Hx     Social History:  reports that he has quit smoking. His smoking use included cigars. He has never used smokeless tobacco. He reports that he does not drink alcohol and does not use drugs.   Physical Exam: BP (!) 147/94   Pulse 71    Constitutional:  Alert and oriented, No acute distress. HEENT: [place] AT, moist mucus membranes.  Trachea midline, no masses. Cardiovascular: RRR Respiratory: Normal respiratory effort, no increased work of breathing. GI: Abdomen is soft, nontender, nondistended, no abdominal masses GU: No CVA tenderness Skin: No rashes, bruises or suspicious lesions. Neurologic: Grossly intact, no focal deficits, moving all 4 extremities. Psychiatric: Normal mood and affect.  Laboratory Data: Lab Results  Component Value Date   WBC 10.0 07/22/2022   HGB 13.0 07/22/2022   HCT 40.0 07/22/2022   MCV 89.3 07/22/2022   PLT 302 07/22/2022    Lab Results  Component Value Date   CREATININE 0.91 07/22/2022    No results found for: "PSA"  No results found for: "TESTOSTERONE"  Lab Results  Component Value Date   HGBA1C 5.7 (H) 03/14/2022    Urinalysis    Component Value Date/Time   COLORURINE YELLOW 07/22/2022 0115   APPEARANCEUR CLEAR 07/22/2022 0115   APPEARANCEUR Hazy (A) 05/22/2022 1151   LABSPEC 1.014 07/22/2022 0115   PHURINE 6.0 07/22/2022 0115   GLUCOSEU NEGATIVE 07/22/2022 0115   HGBUR LARGE (A) 07/22/2022 0115   BILIRUBINUR NEGATIVE 07/22/2022 0115   BILIRUBINUR Negative 05/22/2022 1151   KETONESUR NEGATIVE 07/22/2022 0115   PROTEINUR NEGATIVE 07/22/2022 0115   UROBILINOGEN 0.2 04/05/2014 0506   NITRITE NEGATIVE 07/22/2022 0115   LEUKOCYTESUR NEGATIVE 07/22/2022 0115    Lab Results  Component Value Date   LABMICR See below: 05/22/2022   WBCUA 6-10 (A) 05/22/2022   LABEPIT 0-10 05/22/2022   MUCUS Present 04/25/2022   BACTERIA NONE SEEN 07/22/2022    Pertinent Imaging: CT reviewed.  Results for orders placed during the hospital encounter of 10/10/21  Prior KUB -- DG Abd 1 View  Narrative CLINICAL DATA:  Evaluate for kidney stones.  No pain.  EXAM: ABDOMEN - 1 VIEW  COMPARISON:  None.  FINDINGS: Both kidneys are partially obscured by bowel contents. Multiple  tiny high  attenuation foci are seen in the bowel contents of the proximal transverse colon which limits evaluation for tiny stones in the right kidney. There appears to be a 9 mm stone in the lower pole of the right kidney. The remainder of the high attenuation foci over the right kidney and right upper quadrant are favored to be bowel contents. No definite left-sided stones. Calcifications in the pelvis are favored to be phleboliths. No ureteral or bladder stones definitely identified. No other abnormalities.  IMPRESSION: 1. Evaluation of the kidneys is limited due to overlying bowel contents is above. There appears to be a 9 mm stone in the lower pole of the right kidney. No other definitive stones identified.   Electronically Signed By: Dorise Bullion III M.D. On: 10/10/2021 18:52  No valid procedures specified. No results found for this or any previous visit.  Results for orders placed during the hospital encounter of 07/21/22  CT Renal Stone Study  Narrative CLINICAL DATA:  Right lower quadrant pain, right flank pain  EXAM: CT ABDOMEN AND PELVIS WITHOUT CONTRAST  TECHNIQUE: Multidetector CT imaging of the abdomen and pelvis was performed following the standard protocol without IV contrast.  RADIATION DOSE REDUCTION: This exam was performed according to the departmental dose-optimization program which includes automated exposure control, adjustment of the mA and/or kV according to patient size and/or use of iterative reconstruction technique.  COMPARISON:  06/10/2020  FINDINGS: Lower chest: No acute abnormality  Hepatobiliary: No focal hepatic abnormality. Gallbladder unremarkable.  Pancreas: No focal abnormality or ductal dilatation.  Spleen: No focal abnormality.  Normal size.  Adrenals/Urinary Tract: 5 mm proximal right ureteral stone with mild right hydronephrosis and perinephric stranding. Bilateral nephrolithiasis. Adrenal glands and urinary bladder  unremarkable.  Stomach/Bowel: Prior partial colectomy with left lower quadrant ostomy. Colonic diverticulosis.  Vascular/Lymphatic: Aortic atherosclerosis. No evidence of aneurysm or adenopathy.  Reproductive: Prostate enlargement.  Other: No free fluid or free air.  Musculoskeletal: Prior left hip replacement. No acute bony abnormality.  IMPRESSION: 5 mm proximal right ureteral stone with mild right hydronephrosis and perinephric stranding.  Bilateral nephrolithiasis.  Left lower quadrant ostomy.  Colonic diverticulosis.  Aortic atherosclerosis.   Electronically Signed By: Rolm Baptise M.D. On: 07/22/2022 00:12   Assessment & Plan:    1. Right ureteral stone - disc with patient the nature r/b of cont stone passage, off label MET with alpha blocker, URS/HLL/stent and ESWL. He elects to proceed with ESWL. Stone was visible on scout and on prior KUB (when it was in the right lower pole).   He is on low dose ASA but stone is outside the renal shadow.   - Urinalysis, Routine w reflex microscopic  2. BPH - doing well.   No follow-ups on file.  Festus Aloe, MD  [facility]  [address] [place], [place] [address] [phone]

## 2022-07-25 ENCOUNTER — Ambulatory Visit (HOSPITAL_COMMUNITY)
Admission: RE | Admit: 2022-07-25 | Discharge: 2022-07-25 | Disposition: A | Discharge status: Home / Self Care | Payer: No Typology Code available for payment source | Source: Home / Self Care | Attending: Urology | Admitting: Urology

## 2022-07-25 ENCOUNTER — Encounter (HOSPITAL_COMMUNITY)
Admission: RE | Disposition: A | Discharge status: Home / Self Care | Payer: Self-pay | Source: Home / Self Care | Attending: Urology

## 2022-07-25 ENCOUNTER — Encounter (HOSPITAL_COMMUNITY): Payer: Self-pay | Admitting: Urology

## 2022-07-25 ENCOUNTER — Ambulatory Visit (HOSPITAL_COMMUNITY)
Admission: RE | Admit: 2022-07-25 | Discharge: 2022-07-25 | Disposition: A | Discharge status: Home / Self Care | Payer: No Typology Code available for payment source | Attending: Urology | Admitting: Urology

## 2022-07-25 DIAGNOSIS — R351 Nocturia: Secondary | ICD-10-CM | POA: Insufficient documentation

## 2022-07-25 DIAGNOSIS — R3912 Poor urinary stream: Secondary | ICD-10-CM | POA: Diagnosis not present

## 2022-07-25 DIAGNOSIS — Z85038 Personal history of other malignant neoplasm of large intestine: Secondary | ICD-10-CM | POA: Diagnosis not present

## 2022-07-25 DIAGNOSIS — Z7982 Long term (current) use of aspirin: Secondary | ICD-10-CM | POA: Diagnosis not present

## 2022-07-25 DIAGNOSIS — Z87442 Personal history of urinary calculi: Secondary | ICD-10-CM | POA: Diagnosis not present

## 2022-07-25 DIAGNOSIS — Z96642 Presence of left artificial hip joint: Secondary | ICD-10-CM | POA: Diagnosis not present

## 2022-07-25 DIAGNOSIS — N529 Male erectile dysfunction, unspecified: Secondary | ICD-10-CM | POA: Diagnosis not present

## 2022-07-25 DIAGNOSIS — I10 Essential (primary) hypertension: Secondary | ICD-10-CM | POA: Diagnosis not present

## 2022-07-25 DIAGNOSIS — N201 Calculus of ureter: Secondary | ICD-10-CM | POA: Diagnosis not present

## 2022-07-25 DIAGNOSIS — I878 Other specified disorders of veins: Secondary | ICD-10-CM | POA: Diagnosis not present

## 2022-07-25 DIAGNOSIS — N132 Hydronephrosis with renal and ureteral calculous obstruction: Secondary | ICD-10-CM | POA: Diagnosis not present

## 2022-07-25 DIAGNOSIS — N401 Enlarged prostate with lower urinary tract symptoms: Secondary | ICD-10-CM | POA: Diagnosis not present

## 2022-07-25 HISTORY — PX: EXTRACORPOREAL SHOCK WAVE LITHOTRIPSY: SHX1557

## 2022-07-25 SURGERY — LITHOTRIPSY, ESWL
Anesthesia: LOCAL | Laterality: Right

## 2022-07-25 MED ORDER — ONDANSETRON 8 MG PO TBDP: 8.0000 mg | ORAL_TABLET | Freq: Three times a day (TID) | ORAL | 0 refills | Status: DC | PRN

## 2022-07-25 MED ORDER — SODIUM CHLORIDE 0.9 % IV SOLN: INTRAVENOUS | Status: DC

## 2022-07-25 MED ORDER — DIPHENHYDRAMINE HCL 25 MG PO CAPS: 25.0000 mg | ORAL_CAPSULE | ORAL | Status: AC

## 2022-07-25 MED ORDER — DIPHENHYDRAMINE HCL 25 MG PO CAPS
ORAL_CAPSULE | ORAL | Status: AC
  Administered 2022-07-25: 25 mg via ORAL
  Filled 2022-07-25: qty 1

## 2022-07-25 MED ORDER — TAMSULOSIN HCL 0.4 MG PO CAPS: 0.4000 mg | ORAL_CAPSULE | Freq: Every day | ORAL | 11 refills | Status: DC

## 2022-07-25 MED ORDER — OXYCODONE-ACETAMINOPHEN 5-325 MG PO TABS: 1.0000 | ORAL_TABLET | ORAL | 0 refills | Status: DC | PRN

## 2022-07-25 MED ORDER — DIAZEPAM 5 MG PO TABS: 10.0000 mg | ORAL_TABLET | ORAL | Status: AC

## 2022-07-25 MED ORDER — DIAZEPAM 5 MG PO TABS
ORAL_TABLET | ORAL | Status: AC
  Administered 2022-07-25: 10 mg via ORAL
  Filled 2022-07-25: qty 2

## 2022-07-25 MED FILL — Oxycodone w/ Acetaminophen Tab 5-325 MG: ORAL | Qty: 6 | Status: AC

## 2022-07-25 NOTE — Interval H&P Note (Signed)
History and Physical Interval Note:  07/25/2022 8:45 AM  [name]  has presented today for surgery, with the diagnosis of right ureteral stone.  The various methods of treatment have been discussed with the patient and family. After consideration of risks, benefits and other options for treatment, the patient has consented to  Procedure(s): EXTRACORPOREAL SHOCK WAVE LITHOTRIPSY (ESWL) (Right) as a surgical intervention.  The patient's history has been reviewed, patient examined, no change in status, stable for surgery.  I have reviewed the patient's chart and labs.  Questions were answered to the patient's satisfaction.     Nicolette Bang

## 2022-07-26 ENCOUNTER — Telehealth: Payer: Self-pay

## 2022-07-26 NOTE — Telephone Encounter (Signed)
Patient called with complaints with of not having to have a BM.  Verbal from Dr. Alyson Ingles for pt to take OTC Dulcolax suppository.  Returned patient's call and informed him of MD response, pt was able to have a BM and will try MD recommendation if he has anymore issues.

## 2022-07-27 ENCOUNTER — Encounter (HOSPITAL_COMMUNITY): Payer: Self-pay | Admitting: Urology

## 2022-08-07 ENCOUNTER — Other Ambulatory Visit: Payer: Self-pay

## 2022-08-07 DIAGNOSIS — N200 Calculus of kidney: Secondary | ICD-10-CM

## 2022-08-08 ENCOUNTER — Ambulatory Visit (INDEPENDENT_AMBULATORY_CARE_PROVIDER_SITE_OTHER): Payer: No Typology Code available for payment source | Admitting: Urology

## 2022-08-08 ENCOUNTER — Ambulatory Visit (HOSPITAL_COMMUNITY)
Admission: RE | Admit: 2022-08-08 | Discharge: 2022-08-08 | Disposition: A | Discharge status: Home / Self Care | Payer: No Typology Code available for payment source | Source: Ambulatory Visit | Attending: Urology | Admitting: Urology

## 2022-08-08 VITALS — BP 134/87 | HR 87 | Ht 65.0 in | Wt 194.0 lb

## 2022-08-08 DIAGNOSIS — R109 Unspecified abdominal pain: Secondary | ICD-10-CM | POA: Diagnosis not present

## 2022-08-08 DIAGNOSIS — N5201 Erectile dysfunction due to arterial insufficiency: Secondary | ICD-10-CM

## 2022-08-08 DIAGNOSIS — N200 Calculus of kidney: Secondary | ICD-10-CM

## 2022-08-08 MED ORDER — TADALAFIL 20 MG PO TABS: 20.0000 mg | ORAL_TABLET | Freq: Every day | ORAL | 5 refills | Status: DC | PRN

## 2022-08-08 NOTE — Patient Instructions (Signed)

## 2022-08-08 NOTE — Progress Notes (Signed)
08/08/2022 1:23 PM   [name] March 22, 1956 [id number]  Referring provider: Coral Spikes, DO [place],  [place] [address]  Nephrolithiasis and erectile dysfunction   HPI: [name] is a 66yo here for folllowup for nephrolithiasis and erectile dysfunction. He is doing well after ESWL. He passed multiple fragments. KUB shows no residual fragments. He denies any flank pain currently. He has isseus getting an maintaining an erection for the past 2-3 years. Good libido. Good exercise tolerance. No prior PDE5 therapy    PMH: Past Medical History:  Diagnosis Date   Arthritis    hands and shoulders (left shoulder is worse than right)   Aspiration pneumonia ([place]) 03/24/2014   after surgery for colostomy   BPH (benign prostatic hyperplasia)    Colostomy in place [facility])    Complication of anesthesia 03/24/2014   aspiration pneumonia after surgery for colostomy, no problem with  surgeries since then   Difficulty urinating    Dyspnea    @ times due to hernias, [facility] 04-20-2022, can lie flat   GERD (gastroesophageal reflux disease)    Hypertension    Ileostomy present ([place])    Incisional hernia    right abdomen   Parastomal hernia left    Rectal cancer, uT2uN0, s/p LAR/ileostomy 03/20/2014 colostomy Aug2015 12/25/2013   TCS MAY 2015    Restless leg    Right inguinal hernia    Shortening, leg, congenital    Left   Skin irritation    Surrounding ostomy site    Surgical History: Past Surgical History:  Procedure Laterality Date   COLON RESECTION N/A 03/24/2014   Procedure: Diagnostic  laparotomy, takedown of coloanal anastamosis, end colostomy, with wound vac;  Surgeon: Adin Hector, MD;  [facility];  Service: General;  Laterality: N/A;   COLONOSCOPY N/A 12/22/2013   SLF: 1. Normal mucosa in the terminal ilium. 2. Moderate diverticulosis in the transverse colon, descending colon, and sigmoid colon. 3. Rectal  bleeding due ot rectal mass and hemorrhoids 4. Moderate sized internal hemorrhoids.    COLONOSCOPY N/A 01/19/2015   Procedure: COLONOSCOPY;  Surgeon: Danie Binder, MD;  [facility];  Service: Endoscopy;  Laterality: N/A;  1215   COLONOSCOPY N/A 03/22/2018   Procedure: COLONOSCOPY;  Surgeon: Danie Binder, MD;  [facility];  Service: Endoscopy;  Laterality: N/A;  2:15pm   COLOSTOMY     03-24-2014   ESOPHAGOGASTRODUODENOSCOPY N/A 12/22/2013   SLF: Probable Candia esophagitis2. Probable Barretts esphagus 3. Mild gastritis.    EUS N/A 01/01/2014   Procedure: LOWER ENDOSCOPIC ULTRASOUND (EUS);  Surgeon: Milus Banister, MD;  Location: [facility];  Service: Endoscopy;  Laterality: N/A;   EXAMINATION UNDER ANESTHESIA  08/25/2014   Procedure: EXAM UNDER ANESTHESIA;  Surgeon: Michael Boston, MD;  [facility];  Service: General;;   EXTRACORPOREAL SHOCK WAVE LITHOTRIPSY Right 07/25/2022   Procedure: EXTRACORPOREAL SHOCK WAVE LITHOTRIPSY (ESWL);  Surgeon: Cleon Gustin, MD;  [facility];  Service: Urology;  Laterality: Right;   eye reattachment and ear reattachment  Right 08/21/1969   FLEXIBLE SIGMOIDOSCOPY N/A 03/24/2014   Procedure: FLEXIBLE SIGMOIDOSCOPY;  Surgeon: Adin Hector, MD;  [facility];  Service: General;  Laterality: N/A;   HIP FRACTURE SURGERY Left age 38 on 04-06-1970   ILEOSTOMY     ILEOSTOMY CLOSURE N/A 08/25/2014   Procedure: TAKEDOWN OF LOOP ILEOSTOMY ;  Surgeon: Michael Boston, MD;  [facility];  Service: General;  Laterality: N/A;   INSERTION OF MESH N/A 04/11/2017   Procedure: INSERTION OF MESH ;  Surgeon: Michael Boston, MD;  [facility];  Service: General;  Laterality: N/A;  BILATERAL TAP BLOCK   THULIUM LASER TURP (TRANSURETHRAL RESECTION OF PROSTATE) N/A 04/04/2022   Procedure: THULIUM LASER TURP (TRANSURETHRAL RESECTION OF PROSTATE);  Surgeon: Festus Aloe, MD;  [facility];  Service: Urology;   Laterality: N/A;   TOTAL HIP ARTHROPLASTY Left 11/19/2015   Procedure: LEFT TOTAL HIP ARTHROPLASTY ANTERIOR APPROACH;  Surgeon: Mcarthur Rossetti, MD;  [facility];  Service: Orthopedics;  Laterality: Left;   VENTRAL HERNIA REPAIR N/A 04/11/2017   Procedure: LAPAROSCOPIC INCARCERATED PERISTOMAL AND INCARCERATED INCISIONAL HERNIA X2;  Surgeon: Michael Boston, MD;  [facility];  Service: General;  Laterality: N/A;  BILATERAL TAP BLOCK    Home Medications:  Allergies as of 08/08/2022   No Known Allergies      Medication List        Accurate as of August 08, 2022  1:23 PM. If you have any questions, ask your nurse or doctor.          amLODipine 5 MG tablet Commonly known as: NORVASC Take 1 tablet (5 mg total) by mouth daily.   ascorbic acid 500 MG tablet Commonly known as: VITAMIN C Take 500 mg by mouth 2 (two) times daily.   aspirin EC 81 MG tablet Take 81 mg by mouth daily.   baclofen 10 MG tablet Commonly known as: LIORESAL Take 1 tablet (10 mg total) by mouth at bedtime as needed for muscle spasms. For leg cramping What changed: when to take this   co-enzyme Q-10 30 MG capsule Take 30 mg by mouth daily.   cyanocobalamin 500 MCG tablet Commonly known as: VITAMIN B12 Take 1,000 mcg by mouth daily.   LUTEIN PO Take 1 tablet by mouth 2 (two) times daily.   metoprolol tartrate 25 MG tablet Commonly known as: LOPRESSOR Take 1 tablet (25 mg total) by mouth 2 (two) times daily.   omeprazole 40 MG capsule Commonly known as: PRILOSEC Take 1 capsule (40 mg total) by mouth daily.   ondansetron 8 MG disintegrating tablet Commonly known as: ZOFRAN-ODT Take 1 tablet (8 mg total) by mouth every 8 (eight) hours as needed for nausea or vomiting.   ONE DAILY FOR MEN 50+ ADVANCED PO Take 1 tablet by mouth daily.   OVER THE COUNTER MEDICATION Take 1 tablet by mouth 2 (two) times daily. Vinegar tablet by mouth twice a day   oxyCODONE 5 MG immediate  release tablet Commonly known as: Roxicodone Take 1 tablet (5 mg total) by mouth every 4 (four) hours as needed for severe pain.   oxyCODONE-acetaminophen 5-325 MG tablet Commonly known as: Percocet Take 1 tablet by mouth every 4 (four) hours as needed for moderate pain.   rosuvastatin 10 MG tablet Commonly known as: CRESTOR TAKE 1 TABLET BY MOUTH EVERY DAY   tamsulosin 0.4 MG Caps capsule Commonly known as: FLOMAX Take 1 capsule (0.4 mg total) by mouth daily after supper.   Turmeric 500 MG Caps Take 500 mg by mouth 2 (two) times daily.   vitamin A 25000 UNIT capsule Take 25,000 Units by mouth daily.        Allergies: No Known Allergies  Family History: Family History  Problem Relation Age of Onset   Colon cancer Sister    Emphysema Mother    Heart attack  Brother    Colon polyps Neg Hx     Social History:  reports that he has quit smoking. His smoking use included cigars. He has never used smokeless tobacco. He reports that he does not drink alcohol and does not use drugs.  ROS: All other review of systems were reviewed and are negative except what is noted above in HPI  Physical Exam: BP 134/87   Pulse 87   Ht '5\' 5"'$  (1.651 m)   Wt 194 lb 0.2 oz (88 kg)   BMI 32.28 kg/m   Constitutional:  Alert and oriented, No acute distress. HEENT: [place] AT, moist mucus membranes.  Trachea midline, no masses. Cardiovascular: No clubbing, cyanosis, or edema. Respiratory: Normal respiratory effort, no increased work of breathing. GI: Abdomen is soft, nontender, nondistended, no abdominal masses GU: No CVA tenderness.  Lymph: No cervical or inguinal lymphadenopathy. Skin: No rashes, bruises or suspicious lesions. Neurologic: Grossly intact, no focal deficits, moving all 4 extremities. Psychiatric: Normal mood and affect.  Laboratory Data: Lab Results  Component Value Date   WBC 10.0 07/22/2022   HGB 13.0 07/22/2022   HCT 40.0 07/22/2022   MCV 89.3 07/22/2022   PLT 302  07/22/2022    Lab Results  Component Value Date   CREATININE 0.91 07/22/2022    No results found for: "PSA"  No results found for: "TESTOSTERONE"  Lab Results  Component Value Date   HGBA1C 5.7 (H) 03/14/2022    Urinalysis    Component Value Date/Time   COLORURINE YELLOW 07/22/2022 0115   APPEARANCEUR Clear 07/24/2022 0912   LABSPEC 1.014 07/22/2022 0115   PHURINE 6.0 07/22/2022 0115   GLUCOSEU Negative 07/24/2022 0912   HGBUR LARGE (A) 07/22/2022 0115   BILIRUBINUR Negative 07/24/2022 0912   KETONESUR NEGATIVE 07/22/2022 0115   PROTEINUR Negative 07/24/2022 0912   PROTEINUR NEGATIVE 07/22/2022 0115   UROBILINOGEN 0.2 04/05/2014 0506   NITRITE Negative 07/24/2022 0912   NITRITE NEGATIVE 07/22/2022 0115   LEUKOCYTESUR Trace (A) 07/24/2022 0912   LEUKOCYTESUR NEGATIVE 07/22/2022 0115    Lab Results  Component Value Date   LABMICR See below: 07/24/2022   WBCUA 6-10 (A) 07/24/2022   LABEPIT 0-10 07/24/2022   MUCUS Present 04/25/2022   BACTERIA None seen 07/24/2022    Pertinent Imaging: KUb today: Images reviewed and discussed with the patient  Results for orders placed in visit on 07/24/22  DG Abd 1 View  Narrative CLINICAL DATA:  Right ureteral stone.  EXAM: ABDOMEN - 1 VIEW  COMPARISON:  None Available.  FINDINGS: The bowel gas pattern is normal. No renal calculus is identified. There is a 8 mm calculus at the level of the L4-L5 interspace on the right, compatible with known ureteral calculus. Scattered phleboliths are present in the pelvis. Total hip arthroplasty changes are present on the left.  IMPRESSION: 8 mm calculus at the L4-L5 interspace on the right, compatible with known ureteral calculus.   Electronically Signed By: Brett Fairy M.D. On: 07/26/2022 04:19  No results found for this or any previous visit.  No results found for this or any previous visit.  No results found for this or any previous visit.  No results found for  this or any previous visit.  No valid procedures specified. No results found for this or any previous visit.  Results for orders placed during the hospital encounter of 07/21/22  CT Renal Stone Study  Narrative CLINICAL DATA:  Right lower quadrant pain, right flank pain  EXAM: CT ABDOMEN  AND PELVIS WITHOUT CONTRAST  TECHNIQUE: Multidetector CT imaging of the abdomen and pelvis was performed following the standard protocol without IV contrast.  RADIATION DOSE REDUCTION: This exam was performed according to the departmental dose-optimization program which includes automated exposure control, adjustment of the mA and/or kV according to patient size and/or use of iterative reconstruction technique.  COMPARISON:  06/10/2020  FINDINGS: Lower chest: No acute abnormality  Hepatobiliary: No focal hepatic abnormality. Gallbladder unremarkable.  Pancreas: No focal abnormality or ductal dilatation.  Spleen: No focal abnormality.  Normal size.  Adrenals/Urinary Tract: 5 mm proximal right ureteral stone with mild right hydronephrosis and perinephric stranding. Bilateral nephrolithiasis. Adrenal glands and urinary bladder unremarkable.  Stomach/Bowel: Prior partial colectomy with left lower quadrant ostomy. Colonic diverticulosis.  Vascular/Lymphatic: Aortic atherosclerosis. No evidence of aneurysm or adenopathy.  Reproductive: Prostate enlargement.  Other: No free fluid or free air.  Musculoskeletal: Prior left hip replacement. No acute bony abnormality.  IMPRESSION: 5 mm proximal right ureteral stone with mild right hydronephrosis and perinephric stranding.  Bilateral nephrolithiasis.  Left lower quadrant ostomy.  Colonic diverticulosis.  Aortic atherosclerosis.   Electronically Signed By: Rolm Baptise M.D. On: 07/22/2022 00:12   Assessment & Plan:    1. Kidney stones -FOllowup 1 month with a kUB - Urinalysis, Routine w reflex microscopic  2. Erectile  dysfunction -We will trial tadalafil '20mg'$  prn   No follow-ups on file.  Nicolette Bang, MD  [facility] [place]

## 2022-08-09 LAB — URINALYSIS, ROUTINE W REFLEX MICROSCOPIC
Bilirubin, UA: NEGATIVE
Glucose, UA: NEGATIVE
Ketones, UA: NEGATIVE
Nitrite, UA: NEGATIVE
Specific Gravity, UA: 1.02 (ref 1.005–1.030)
Urobilinogen, Ur: 0.2 mg/dL (ref 0.2–1.0)
pH, UA: 5.5 (ref 5.0–7.5)

## 2022-08-09 LAB — MICROSCOPIC EXAMINATION: Bacteria, UA: NONE SEEN

## 2022-08-10 ENCOUNTER — Telehealth: Payer: Self-pay | Admitting: *Deleted

## 2022-08-10 NOTE — Telephone Encounter (Signed)
Patient states he needs new scripts for all his ostemy supplies- he needs all his regular supplies and also needs  7906- stoma powder [phone]- skin barrier extender New barrier belt- just one lasts a year  [facility]  Phone  [phone] Fax      [phone]

## 2022-08-10 NOTE — Telephone Encounter (Signed)
Script written out, signed by provider and faxed to provided fax number. Pt is aware

## 2022-08-17 ENCOUNTER — Encounter: Payer: Self-pay | Admitting: Urology

## 2022-09-06 ENCOUNTER — Encounter: Payer: Self-pay | Admitting: Urology

## 2022-09-06 ENCOUNTER — Ambulatory Visit: Payer: No Typology Code available for payment source | Admitting: Urology

## 2022-09-06 ENCOUNTER — Ambulatory Visit (HOSPITAL_COMMUNITY)
Admission: RE | Admit: 2022-09-06 | Discharge: 2022-09-06 | Disposition: A | Discharge status: Home / Self Care | Payer: No Typology Code available for payment source | Source: Ambulatory Visit | Attending: Urology | Admitting: Urology

## 2022-09-06 ENCOUNTER — Telehealth: Payer: Self-pay | Admitting: Family Medicine

## 2022-09-06 VITALS — BP 131/77 | HR 60

## 2022-09-06 DIAGNOSIS — N5201 Erectile dysfunction due to arterial insufficiency: Secondary | ICD-10-CM

## 2022-09-06 DIAGNOSIS — N200 Calculus of kidney: Secondary | ICD-10-CM | POA: Insufficient documentation

## 2022-09-06 DIAGNOSIS — Z87442 Personal history of urinary calculi: Secondary | ICD-10-CM | POA: Diagnosis not present

## 2022-09-06 LAB — URINALYSIS, ROUTINE W REFLEX MICROSCOPIC
Bilirubin, UA: NEGATIVE
Glucose, UA: NEGATIVE
Ketones, UA: NEGATIVE
Leukocytes,UA: NEGATIVE
Nitrite, UA: NEGATIVE
Specific Gravity, UA: 1.03 (ref 1.005–1.030)
Urobilinogen, Ur: 0.2 mg/dL (ref 0.2–1.0)
pH, UA: 5.5 (ref 5.0–7.5)

## 2022-09-06 LAB — MICROSCOPIC EXAMINATION: Bacteria, UA: NONE SEEN

## 2022-09-06 MED ORDER — TADALAFIL 5 MG PO TABS: 5.0000 mg | ORAL_TABLET | Freq: Every day | ORAL | 11 refills | Status: DC

## 2022-09-06 NOTE — Progress Notes (Signed)
09/06/2022 10:54 AM   [name] 1956/02/01 [id number]  Referring provider: Coral Spikes, DO [address],  [place] [address]   Followup nephrolithiasis and erectile dysfunction  HPI: [name] is a 67yo here for followup for nephrolithiasis and erectile dysfunction. No stone events since last visit. He denies nay flank pain. He used tadalafil '20mg'$  prn with good results   PMH: Past Medical History:  Diagnosis Date   Arthritis    hands and shoulders (left shoulder is worse than right)   Aspiration pneumonia ([place]) 03/24/2014   after surgery for colostomy   BPH (benign prostatic hyperplasia)    Colostomy in place [facility])    Complication of anesthesia 03/24/2014   aspiration pneumonia after surgery for colostomy, no problem with  surgeries since then   Difficulty urinating    Dyspnea    @ times due to hernias, [facility]  [facility] 04-20-2022, can lie flat   GERD (gastroesophageal reflux disease)    Hypertension    Ileostomy present ([place])    Incisional hernia    right abdomen   Parastomal hernia left    Rectal cancer, uT2uN0, s/p LAR/ileostomy 03/20/2014 colostomy Aug2015 12/25/2013   TCS MAY 2015    Restless leg    Right inguinal hernia    Shortening, leg, congenital    Left   Skin irritation    Surrounding ostomy site    Surgical History: Past Surgical History:  Procedure Laterality Date   COLON RESECTION N/A 03/24/2014   Procedure: Diagnostic  laparotomy, takedown of coloanal anastamosis, end colostomy, with wound vac;  Surgeon: Adin Hector, MD;  [facility];  Service: General;  Laterality: N/A;   COLONOSCOPY N/A 12/22/2013   SLF: 1. Normal mucosa in the terminal ilium. 2. Moderate diverticulosis in the transverse colon, descending colon, and sigmoid colon. 3. Rectal bleeding due ot rectal mass and hemorrhoids 4. Moderate sized internal hemorrhoids.    COLONOSCOPY N/A 01/19/2015   Procedure: COLONOSCOPY;  Surgeon:  Danie Binder, MD;  [facility];  Service: Endoscopy;  Laterality: N/A;  1215   COLONOSCOPY N/A 03/22/2018   Procedure: COLONOSCOPY;  Surgeon: Danie Binder, MD;  [facility];  Service: Endoscopy;  Laterality: N/A;  2:15pm   COLOSTOMY     03-24-2014   ESOPHAGOGASTRODUODENOSCOPY N/A 12/22/2013   SLF: Probable Candia esophagitis2. Probable Barretts esphagus 3. Mild gastritis.    EUS N/A 01/01/2014   Procedure: LOWER ENDOSCOPIC ULTRASOUND (EUS);  Surgeon: Milus Banister, MD;  Location: [facility];  Service: Endoscopy;  Laterality: N/A;   EXAMINATION UNDER ANESTHESIA  08/25/2014   Procedure: EXAM UNDER ANESTHESIA;  Surgeon: Michael Boston, MD;  [facility];  Service: General;;   EXTRACORPOREAL SHOCK WAVE LITHOTRIPSY Right 07/25/2022   Procedure: EXTRACORPOREAL SHOCK WAVE LITHOTRIPSY (ESWL);  Surgeon: Cleon Gustin, MD;  [facility];  Service: Urology;  Laterality: Right;   eye reattachment and ear reattachment  Right 08/21/1969   FLEXIBLE SIGMOIDOSCOPY N/A 03/24/2014   Procedure: FLEXIBLE SIGMOIDOSCOPY;  Surgeon: Adin Hector, MD;  [facility];  Service: General;  Laterality: N/A;   HIP FRACTURE SURGERY Left age 25 on 04-06-1970   ILEOSTOMY     ILEOSTOMY CLOSURE N/A 08/25/2014   Procedure: TAKEDOWN OF LOOP ILEOSTOMY ;  Surgeon: Michael Boston, MD;  [facility];  Service: General;  Laterality: N/A;   INSERTION OF MESH N/A 04/11/2017   Procedure: INSERTION OF MESH ;  Surgeon:  Michael Boston, MD;  [facility];  Service: General;  Laterality: N/A;  BILATERAL TAP BLOCK   THULIUM LASER TURP (TRANSURETHRAL RESECTION OF PROSTATE) N/A 04/04/2022   Procedure: THULIUM LASER TURP (TRANSURETHRAL RESECTION OF PROSTATE);  Surgeon: Festus Aloe, MD;  Location: [facility];  Service: Urology;  Laterality: N/A;   TOTAL HIP ARTHROPLASTY Left 11/19/2015   Procedure: LEFT TOTAL HIP ARTHROPLASTY ANTERIOR APPROACH;  Surgeon: Mcarthur Rossetti, MD;  [facility];  Service: Orthopedics;  Laterality: Left;   VENTRAL HERNIA REPAIR N/A 04/11/2017   Procedure: LAPAROSCOPIC INCARCERATED PERISTOMAL AND INCARCERATED INCISIONAL HERNIA X2;  Surgeon: Michael Boston, MD;  [facility];  Service: General;  Laterality: N/A;  BILATERAL TAP BLOCK    Home Medications:  Allergies as of 09/06/2022   No Known Allergies      Medication List        Accurate as of September 06, 2022 10:54 AM. If you have any questions, ask your nurse or doctor.          amLODipine 5 MG tablet Commonly known as: NORVASC Take 1 tablet (5 mg total) by mouth daily.   ascorbic acid 500 MG tablet Commonly known as: VITAMIN C Take 500 mg by mouth 2 (two) times daily.   aspirin EC 81 MG tablet Take 81 mg by mouth daily.   baclofen 10 MG tablet Commonly known as: LIORESAL Take 1 tablet (10 mg total) by mouth at bedtime as needed for muscle spasms. For leg cramping What changed: when to take this   co-enzyme Q-10 30 MG capsule Take 30 mg by mouth daily.   cyanocobalamin 500 MCG tablet Commonly known as: VITAMIN B12 Take 1,000 mcg by mouth daily.   LUTEIN PO Take 1 tablet by mouth 2 (two) times daily.   metoprolol tartrate 25 MG tablet Commonly known as: LOPRESSOR Take 1 tablet (25 mg total) by mouth 2 (two) times daily.   omeprazole 40 MG capsule Commonly known as: PRILOSEC Take 1 capsule (40 mg total) by mouth daily.   ondansetron 8 MG disintegrating tablet Commonly known as: ZOFRAN-ODT Take 1 tablet (8 mg total) by mouth every 8 (eight) hours as needed for nausea or vomiting.   ONE DAILY FOR MEN 50+ ADVANCED PO Take 1 tablet by mouth daily.   OVER THE COUNTER MEDICATION Take 1 tablet by mouth 2 (two) times daily. Vinegar tablet by mouth twice a day   oxyCODONE 5 MG immediate release tablet Commonly known as: Roxicodone Take 1 tablet (5 mg total) by mouth every 4 (four) hours as needed for severe pain.    oxyCODONE-acetaminophen 5-325 MG tablet Commonly known as: Percocet Take 1 tablet by mouth every 4 (four) hours as needed for moderate pain.   rosuvastatin 10 MG tablet Commonly known as: CRESTOR TAKE 1 TABLET BY MOUTH EVERY DAY   tadalafil 20 MG tablet Commonly known as: CIALIS Take 1 tablet (20 mg total) by mouth daily as needed.   tamsulosin 0.4 MG Caps capsule Commonly known as: FLOMAX Take 1 capsule (0.4 mg total) by mouth daily after supper.   Turmeric 500 MG Caps Take 500 mg by mouth 2 (two) times daily.   vitamin A 25000 UNIT capsule Take 25,000 Units by mouth daily.        Allergies: No Known Allergies  Family History: Family History  Problem Relation Age of Onset   Colon cancer Sister    Emphysema Mother    Heart attack Brother  Colon polyps Neg Hx     Social History:  reports that he has quit smoking. His smoking use included cigars. He has never used smokeless tobacco. He reports that he does not drink alcohol and does not use drugs.  ROS: All other review of systems were reviewed and are negative except what is noted above in HPI  Physical Exam: BP 131/77   Pulse 60   Constitutional:  Alert and oriented, No acute distress. HEENT: [place] AT, moist mucus membranes.  Trachea midline, no masses. Cardiovascular: No clubbing, cyanosis, or edema. Respiratory: Normal respiratory effort, no increased work of breathing. GI: Abdomen is soft, nontender, nondistended, no abdominal masses GU: No CVA tenderness.  Lymph: No cervical or inguinal lymphadenopathy. Skin: No rashes, bruises or suspicious lesions. Neurologic: Grossly intact, no focal deficits, moving all 4 extremities. Psychiatric: Normal mood and affect.  Laboratory Data: Lab Results  Component Value Date   WBC 10.0 07/22/2022   HGB 13.0 07/22/2022   HCT 40.0 07/22/2022   MCV 89.3 07/22/2022   PLT 302 07/22/2022    Lab Results  Component Value Date   CREATININE 0.91 07/22/2022    No  results found for: "PSA"  No results found for: "TESTOSTERONE"  Lab Results  Component Value Date   HGBA1C 5.7 (H) 03/14/2022    Urinalysis    Component Value Date/Time   COLORURINE YELLOW 07/22/2022 0115   APPEARANCEUR Clear 08/08/2022 1303   LABSPEC 1.014 07/22/2022 0115   PHURINE 6.0 07/22/2022 0115   GLUCOSEU Negative 08/08/2022 1303   HGBUR LARGE (A) 07/22/2022 0115   BILIRUBINUR Negative 08/08/2022 1303   KETONESUR NEGATIVE 07/22/2022 0115   PROTEINUR Trace 08/08/2022 1303   PROTEINUR NEGATIVE 07/22/2022 0115   UROBILINOGEN 0.2 04/05/2014 0506   NITRITE Negative 08/08/2022 1303   NITRITE NEGATIVE 07/22/2022 0115   LEUKOCYTESUR Trace (A) 08/08/2022 1303   LEUKOCYTESUR NEGATIVE 07/22/2022 0115    Lab Results  Component Value Date   LABMICR See below: 08/08/2022   WBCUA 11-30 (A) 08/08/2022   LABEPIT 0-10 08/08/2022   MUCUS Present 04/25/2022   BACTERIA None seen 08/08/2022    Pertinent Imaging:  Results for orders placed in visit on 08/07/22  DG Abd 1 View  Narrative CLINICAL DATA:  Flank pain ESWL.  History kidney stones.  EXAM: ABDOMEN - 1 VIEW  COMPARISON:  07/25/2022  FINDINGS: Bowel gas pattern is nonobstructive. A few small stones project over the lower pole right kidney unchanged. Previously seen 7 mm stone over the right mid ureter no longer visualized. Several pelvic phleboliths unchanged. Surgical suture line over the right lower quadrant likely from previous bowel surgery. Degenerative changes of the spine and right hip. Left hip arthroplasty.  IMPRESSION: 1. Nonobstructive bowel gas pattern. 2. Few small stones over the lower pole right kidney unchanged. Previously seen 7 mm stone over the right mid ureter no longer visualized.   Electronically Signed By: Marin Olp M.D. On: 08/09/2022 16:51  No results found for this or any previous visit.  No results found for this or any previous visit.  No results found for this or any  previous visit.  No results found for this or any previous visit.  No valid procedures specified. No results found for this or any previous visit.  Results for orders placed during the hospital encounter of 07/21/22  CT Renal Stone Study  Narrative CLINICAL DATA:  Right lower quadrant pain, right flank pain  EXAM: CT ABDOMEN AND PELVIS WITHOUT CONTRAST  TECHNIQUE: Multidetector  CT imaging of the abdomen and pelvis was performed following the standard protocol without IV contrast.  RADIATION DOSE REDUCTION: This exam was performed according to the departmental dose-optimization program which includes automated exposure control, adjustment of the mA and/or kV according to patient size and/or use of iterative reconstruction technique.  COMPARISON:  06/10/2020  FINDINGS: Lower chest: No acute abnormality  Hepatobiliary: No focal hepatic abnormality. Gallbladder unremarkable.  Pancreas: No focal abnormality or ductal dilatation.  Spleen: No focal abnormality.  Normal size.  Adrenals/Urinary Tract: 5 mm proximal right ureteral stone with mild right hydronephrosis and perinephric stranding. Bilateral nephrolithiasis. Adrenal glands and urinary bladder unremarkable.  Stomach/Bowel: Prior partial colectomy with left lower quadrant ostomy. Colonic diverticulosis.  Vascular/Lymphatic: Aortic atherosclerosis. No evidence of aneurysm or adenopathy.  Reproductive: Prostate enlargement.  Other: No free fluid or free air.  Musculoskeletal: Prior left hip replacement. No acute bony abnormality.  IMPRESSION: 5 mm proximal right ureteral stone with mild right hydronephrosis and perinephric stranding.  Bilateral nephrolithiasis.  Left lower quadrant ostomy.  Colonic diverticulosis.  Aortic atherosclerosis.   Electronically Signed By: Rolm Baptise M.D. On: 07/22/2022 00:12   Assessment & Plan:    1. Kidney stones -Followup 2 weeksw ith renal US - Urinalysis,  Routine w reflex microscopic  2. Erectile dysfunction due to arterial insufficiency -We will trial tadalafil '5mg'$  daily   No follow-ups on file.  Nicolette Bang, MD  [facility] [place]

## 2022-09-06 NOTE — Telephone Encounter (Signed)
Script faxed to [facility] for ostomy supplies around 11:30 am. [place] to make sure they received script and they confirmed it was received and they are working on it. Pt contacted and made aware.  Fax number for [facility] [phone]

## 2022-09-11 DIAGNOSIS — Z933 Colostomy status: Secondary | ICD-10-CM | POA: Diagnosis not present

## 2022-09-14 ENCOUNTER — Ambulatory Visit (HOSPITAL_COMMUNITY)
Admission: RE | Admit: 2022-09-14 | Discharge: 2022-09-14 | Disposition: A | Discharge status: Home / Self Care | Payer: No Typology Code available for payment source | Source: Ambulatory Visit | Attending: Urology | Admitting: Urology

## 2022-09-14 DIAGNOSIS — N200 Calculus of kidney: Secondary | ICD-10-CM | POA: Diagnosis not present

## 2022-09-19 ENCOUNTER — Ambulatory Visit (HOSPITAL_COMMUNITY): Payer: No Typology Code available for payment source

## 2022-09-19 ENCOUNTER — Ambulatory Visit (INDEPENDENT_AMBULATORY_CARE_PROVIDER_SITE_OTHER): Payer: No Typology Code available for payment source | Admitting: Urology

## 2022-09-19 ENCOUNTER — Encounter: Payer: Self-pay | Admitting: Urology

## 2022-09-19 VITALS — BP 143/78 | HR 71

## 2022-09-19 DIAGNOSIS — N200 Calculus of kidney: Secondary | ICD-10-CM | POA: Diagnosis not present

## 2022-09-19 DIAGNOSIS — R3912 Poor urinary stream: Secondary | ICD-10-CM

## 2022-09-19 DIAGNOSIS — N5201 Erectile dysfunction due to arterial insufficiency: Secondary | ICD-10-CM

## 2022-09-19 DIAGNOSIS — N401 Enlarged prostate with lower urinary tract symptoms: Secondary | ICD-10-CM

## 2022-09-19 MED ORDER — TADALAFIL 5 MG PO TABS: 5.0000 mg | ORAL_TABLET | Freq: Every day | ORAL | 11 refills | Status: DC

## 2022-09-19 MED ORDER — CYCLOBENZAPRINE HCL 5 MG PO TABS: 5.0000 mg | ORAL_TABLET | Freq: Three times a day (TID) | ORAL | 0 refills | Status: DC | PRN

## 2022-09-19 NOTE — Progress Notes (Signed)
09/19/2022 2:15 PM   [name] 25-Jun-1956 [id number]  Referring provider: Coral Spikes, DO [address],  [place] [address]  Followup nephrolithiasis and erectile dysfunction   HPI: Mr Korinek is a 67UQ here for followup for nephrolithiasis and erectile dysfunction. No stone events since last visit. Renal US 1/25 shows no calculi and no hydronephrosis.  He has mild right side and back pain that is worse with standing from a sitting position. Tadalafil '5mg'$  daily works well for his erectile dysfunction   PMH: Past Medical History:  Diagnosis Date   Arthritis    hands and shoulders (left shoulder is worse than right)   Aspiration pneumonia ([place]) 03/24/2014   after surgery for colostomy   BPH (benign prostatic hyperplasia)    Colostomy in place [facility])    Complication of anesthesia 03/24/2014   aspiration pneumonia after surgery for colostomy, no problem with  surgeries since then   Difficulty urinating    Dyspnea    @ times due to hernias, seeing [facility] for  surgical consultation 04-20-2022, can lie flat   GERD (gastroesophageal reflux disease)    Hypertension    Ileostomy present ([place])    Incisional hernia    right abdomen   Parastomal hernia left    Rectal cancer, uT2uN0, s/p LAR/ileostomy 03/20/2014 colostomy Aug2015 12/25/2013   TCS MAY 2015    Restless leg    Right inguinal hernia    Shortening, leg, congenital    Left   Skin irritation    Surrounding ostomy site    Surgical History: Past Surgical History:  Procedure Laterality Date   COLON RESECTION N/A 03/24/2014   Procedure: Diagnostic  laparotomy, takedown of coloanal anastamosis, end colostomy, with wound vac;  Surgeon: Adin Hector, MD;  Location: [facility];  Service: General;  Laterality: N/A;   COLONOSCOPY N/A 12/22/2013   SLF: 1. Normal mucosa in the terminal ilium. 2. Moderate diverticulosis in the transverse colon, descending colon, and sigmoid colon. 3. Rectal bleeding due ot  rectal mass and hemorrhoids 4. Moderate sized internal hemorrhoids.    COLONOSCOPY N/A 01/19/2015   Procedure: COLONOSCOPY;  Surgeon: Danie Binder, MD;  Location: [facility];  Service: Endoscopy;  Laterality: N/A;  1215   COLONOSCOPY N/A 03/22/2018   Procedure: COLONOSCOPY;  Surgeon: Danie Binder, MD;  [facility]: [facility];  Service: Endoscopy;  Laterality: N/A;  2:15pm   COLOSTOMY     03-24-2014   ESOPHAGOGASTRODUODENOSCOPY N/A 12/22/2013   SLF: Probable Candia esophagitis2. Probable Barretts esphagus 3. Mild gastritis.    EUS N/A 01/01/2014   Procedure: LOWER ENDOSCOPIC ULTRASOUND (EUS);  Surgeon: Milus Banister, MD;  Location: [facility];  Service: Endoscopy;  Laterality: N/A;   EXAMINATION UNDER ANESTHESIA  08/25/2014   Procedure: EXAM UNDER ANESTHESIA;  Surgeon: Michael Boston, MD;  Location: [facility];  Service: General;;   EXTRACORPOREAL SHOCK WAVE LITHOTRIPSY Right 07/25/2022   Procedure: EXTRACORPOREAL SHOCK WAVE LITHOTRIPSY (ESWL);  Surgeon: Cleon Gustin, MD;  Location: [facility];  Service: Urology;  Laterality: Right;   eye reattachment and ear reattachment  Right 08/21/1969   FLEXIBLE SIGMOIDOSCOPY N/A 03/24/2014   Procedure: FLEXIBLE SIGMOIDOSCOPY;  Surgeon: Adin Hector, MD;  Location: [facility];  Service: General;  Laterality: N/A;   HIP FRACTURE SURGERY Left age 20 on 04-06-1970   ILEOSTOMY     ILEOSTOMY CLOSURE N/A 08/25/2014   Procedure: TAKEDOWN OF LOOP ILEOSTOMY ;  Surgeon: Michael Boston, MD;  Location: [facility];  Service: General;  Laterality: N/A;   INSERTION OF MESH N/A 04/11/2017   Procedure: INSERTION OF MESH ;  Surgeon: Michael Boston, MD;  Location: [facility];  Service: General;  Laterality: N/A;  BILATERAL TAP BLOCK   THULIUM LASER TURP (TRANSURETHRAL RESECTION OF PROSTATE) N/A 04/04/2022   Procedure: THULIUM LASER TURP (TRANSURETHRAL RESECTION OF PROSTATE);  Surgeon: Festus Aloe, MD;  [facility];  Service: Urology;  Laterality:  N/A;   TOTAL HIP ARTHROPLASTY Left 11/19/2015   Procedure: LEFT TOTAL HIP ARTHROPLASTY ANTERIOR APPROACH;  Surgeon: Mcarthur Rossetti, MD;  Location: [facility];  Service: Orthopedics;  Laterality: Left;   VENTRAL HERNIA REPAIR N/A 04/11/2017   Procedure: LAPAROSCOPIC INCARCERATED PERISTOMAL AND INCARCERATED INCISIONAL HERNIA X2;  Surgeon: Michael Boston, MD;  Location: [facility];  Service: General;  Laterality: N/A;  BILATERAL TAP BLOCK    Home Medications:  Allergies as of 09/19/2022   No Known Allergies      Medication List        Accurate as of September 19, 2022  2:15 PM. If you have any questions, ask your nurse or doctor.          amLODipine 5 MG tablet Commonly known as: NORVASC Take 1 tablet (5 mg total) by mouth daily.   ascorbic acid 500 MG tablet Commonly known as: VITAMIN C Take 500 mg by mouth 2 (two) times daily.   aspirin EC 81 MG tablet Take 81 mg by mouth daily.   baclofen 10 MG tablet Commonly known as: LIORESAL Take 1 tablet (10 mg total) by mouth at bedtime as needed for muscle spasms. For leg cramping What changed: when to take this   co-enzyme Q-10 30 MG capsule Take 30 mg by mouth daily.   cyanocobalamin 500 MCG tablet Commonly known as: VITAMIN B12 Take 1,000 mcg by mouth daily.   LUTEIN PO Take 1 tablet by mouth 2 (two) times daily.   metoprolol tartrate 25 MG tablet Commonly known as: LOPRESSOR Take 1 tablet (25 mg total) by mouth 2 (two) times daily.   omeprazole 40 MG capsule Commonly known as: PRILOSEC Take 1 capsule (40 mg total) by mouth daily.   ondansetron 8 MG disintegrating tablet Commonly known as: ZOFRAN-ODT Take 1 tablet (8 mg total) by mouth every 8 (eight) hours as needed for nausea or vomiting.   ONE DAILY FOR MEN 50+ ADVANCED PO Take 1 tablet by mouth daily.   OVER THE COUNTER MEDICATION Take 1 tablet by mouth 2 (two) times daily. Vinegar tablet by mouth twice a day   oxyCODONE 5 MG immediate release  tablet Commonly known as: Roxicodone Take 1 tablet (5 mg total) by mouth every 4 (four) hours as needed for severe pain.   oxyCODONE-acetaminophen 5-325 MG tablet Commonly known as: Percocet Take 1 tablet by mouth every 4 (four) hours as needed for moderate pain.   rosuvastatin 10 MG tablet Commonly known as: CRESTOR TAKE 1 TABLET BY MOUTH EVERY DAY   tadalafil 20 MG tablet Commonly known as: CIALIS Take 1 tablet (20 mg total) by mouth daily as needed.   tadalafil 5 MG tablet Commonly known as: CIALIS Take 1 tablet (5 mg total) by mouth daily.   tamsulosin 0.4 MG Caps capsule Commonly known as: FLOMAX Take 1 capsule (0.4 mg total) by mouth daily after supper.   Turmeric 500 MG Caps Take 500 mg by mouth 2 (two) times daily.   vitamin A 25000 UNIT capsule Take 25,000 Units by  mouth daily.        Allergies: No Known Allergies  Family History: Family History  Problem Relation Age of Onset   Colon cancer Sister    Emphysema Mother    Heart attack Brother    Colon polyps Neg Hx     Social History:  reports that he has quit smoking. His smoking use included cigars. He has never used smokeless tobacco. He reports that he does not drink alcohol and does not use drugs.  ROS: All other review of systems were reviewed and are negative except what is noted above in HPI  Physical Exam: BP (!) 143/78   Pulse 71   Constitutional:  Alert and oriented, No acute distress. HEENT: [place] AT, moist mucus membranes.  Trachea midline, no masses. Cardiovascular: No clubbing, cyanosis, or edema. Respiratory: Normal respiratory effort, no increased work of breathing. GI: Abdomen is soft, nontender, nondistended, no abdominal masses GU: No CVA tenderness.  Lymph: No cervical or inguinal lymphadenopathy. Skin: No rashes, bruises or suspicious lesions. Neurologic: Grossly intact, no focal deficits, moving all 4 extremities. Psychiatric: Normal mood and affect.  Laboratory Data: Lab  Results  Component Value Date   WBC 10.0 07/22/2022   HGB 13.0 07/22/2022   HCT 40.0 07/22/2022   MCV 89.3 07/22/2022   PLT 302 07/22/2022    Lab Results  Component Value Date   CREATININE 0.91 07/22/2022    No results found for: "PSA"  No results found for: "TESTOSTERONE"  Lab Results  Component Value Date   HGBA1C 5.7 (H) 03/14/2022    Urinalysis    Component Value Date/Time   COLORURINE YELLOW 07/22/2022 0115   APPEARANCEUR Cloudy (A) 09/06/2022 1012   LABSPEC 1.014 07/22/2022 0115   PHURINE 6.0 07/22/2022 0115   GLUCOSEU Negative 09/06/2022 1012   HGBUR LARGE (A) 07/22/2022 0115   BILIRUBINUR Negative 09/06/2022 1012   KETONESUR NEGATIVE 07/22/2022 0115   PROTEINUR 2+ (A) 09/06/2022 1012   PROTEINUR NEGATIVE 07/22/2022 0115   UROBILINOGEN 0.2 04/05/2014 0506   NITRITE Negative 09/06/2022 1012   NITRITE NEGATIVE 07/22/2022 0115   LEUKOCYTESUR Negative 09/06/2022 1012   LEUKOCYTESUR NEGATIVE 07/22/2022 0115    Lab Results  Component Value Date   LABMICR See below: 09/06/2022   WBCUA 0-5 09/06/2022   LABEPIT 0-10 09/06/2022   MUCUS Present (A) 09/06/2022   BACTERIA None seen 09/06/2022    Pertinent Imaging: Renal US 09/14/2022: Images reviewed and discussed with the patient  Results for orders placed during the hospital encounter of 09/06/22  Abdomen 1 view (KUB)  Narrative CLINICAL DATA:  Nephrolithiasis  EXAM: ABDOMEN - 1 VIEW  COMPARISON:  08/08/2022.  FINDINGS: The bowel gas pattern is normal. No radio-opaque calculi or other significant radiographic abnormality are seen. Parenchymal sutures right midabdomen. Left lower quadrant ostomy.  IMPRESSION: Negative. Noncontrast CT would be more definitive for the evaluation of the presence of nephrolithiasis, if indicated.   Electronically Signed By: Sammie Bench M.D. On: 09/07/2022 11:54  No results found for this or any previous visit.  No results found for this or any previous  visit.  No results found for this or any previous visit.  Results for orders placed during the hospital encounter of 09/14/22  Ultrasound renal complete  Narrative CLINICAL DATA:  History of nephrolithiasis.  EXAM: RENAL / URINARY TRACT ULTRASOUND COMPLETE  COMPARISON:  CT renal stone protocol 07/21/2022  FINDINGS: Right Kidney:  Renal measurements: 11.7 x 5.2 x 5.4 cm = volume: 171.9 mL. Echogenicity within  normal limits. No mass or hydronephrosis visualized.  Left Kidney:  Renal measurements: 11.3 x 6.1 x 6.1 cm = volume: 219.5 mL. Echogenicity within normal limits. No mass or hydronephrosis visualized.  Bladder:  Appears normal for degree of bladder distention.  Other:  None.  IMPRESSION: No hydronephrosis.   Electronically Signed By: Lovey Newcomer M.D. On: 09/14/2022 12:36  No valid procedures specified. No results found for this or any previous visit.  Results for orders placed during the hospital encounter of 07/21/22  CT Renal Stone Study  Narrative CLINICAL DATA:  Right lower quadrant pain, right flank pain  EXAM: CT ABDOMEN AND PELVIS WITHOUT CONTRAST  TECHNIQUE: Multidetector CT imaging of the abdomen and pelvis was performed following the standard protocol without IV contrast.  RADIATION DOSE REDUCTION: This exam was performed according to the departmental dose-optimization program which includes automated exposure control, adjustment of the mA and/or kV according to patient size and/or use of iterative reconstruction technique.  COMPARISON:  06/10/2020  FINDINGS: Lower chest: No acute abnormality  Hepatobiliary: No focal hepatic abnormality. Gallbladder unremarkable.  Pancreas: No focal abnormality or ductal dilatation.  Spleen: No focal abnormality.  Normal size.  Adrenals/Urinary Tract: 5 mm proximal right ureteral stone with mild right hydronephrosis and perinephric stranding. Bilateral nephrolithiasis. Adrenal glands and  urinary bladder unremarkable.  Stomach/Bowel: Prior partial colectomy with left lower quadrant ostomy. Colonic diverticulosis.  Vascular/Lymphatic: Aortic atherosclerosis. No evidence of aneurysm or adenopathy.  Reproductive: Prostate enlargement.  Other: No free fluid or free air.  Musculoskeletal: Prior left hip replacement. No acute bony abnormality.  IMPRESSION: 5 mm proximal right ureteral stone with mild right hydronephrosis and perinephric stranding.  Bilateral nephrolithiasis.  Left lower quadrant ostomy.  Colonic diverticulosis.  Aortic atherosclerosis.   Electronically Signed By: Rolm Baptise M.D. On: 07/22/2022 00:12   Assessment & Plan:    1. Kidney stones -CT stone study - Urinalysis, Routine w reflex microscopic  2. Benign prostatic hyperplasia with weak urinary stream -tadalafil '5mg'$  daily  3. Erectile dysfunction due to arterial insufficiency Tadalafil '5mg'$  daily    No follow-ups on file.  Nicolette Bang, MD  [facility] [place]

## 2022-09-19 NOTE — Patient Instructions (Signed)

## 2022-09-20 LAB — URINALYSIS, ROUTINE W REFLEX MICROSCOPIC
Bilirubin, UA: NEGATIVE
Glucose, UA: NEGATIVE
Ketones, UA: NEGATIVE
Leukocytes,UA: NEGATIVE
Nitrite, UA: NEGATIVE
RBC, UA: NEGATIVE
Specific Gravity, UA: 1.025 (ref 1.005–1.030)
Urobilinogen, Ur: 0.2 mg/dL (ref 0.2–1.0)
pH, UA: 6 (ref 5.0–7.5)

## 2022-09-26 ENCOUNTER — Ambulatory Visit (HOSPITAL_COMMUNITY)
Admission: RE | Admit: 2022-09-26 | Discharge: 2022-09-26 | Disposition: A | Discharge status: Home / Self Care | Payer: No Typology Code available for payment source | Source: Ambulatory Visit | Attending: Urology | Admitting: Urology

## 2022-09-26 DIAGNOSIS — N200 Calculus of kidney: Secondary | ICD-10-CM | POA: Insufficient documentation

## 2022-09-26 DIAGNOSIS — K439 Ventral hernia without obstruction or gangrene: Secondary | ICD-10-CM | POA: Diagnosis not present

## 2022-09-26 DIAGNOSIS — K820 Obstruction of gallbladder: Secondary | ICD-10-CM | POA: Diagnosis not present

## 2022-09-26 DIAGNOSIS — N400 Enlarged prostate without lower urinary tract symptoms: Secondary | ICD-10-CM | POA: Diagnosis not present

## 2022-09-27 ENCOUNTER — Other Ambulatory Visit: Payer: Self-pay | Admitting: Family Medicine

## 2022-09-28 ENCOUNTER — Other Ambulatory Visit: Payer: Self-pay | Admitting: Family Medicine

## 2022-10-02 ENCOUNTER — Telehealth: Payer: Self-pay | Admitting: Family Medicine

## 2022-10-02 NOTE — Telephone Encounter (Signed)
Pt came into office on Friday morning in regards to ostomy supplies. Pt states he only received skin barrier with flng and the drainable pouch. RX sent in for stoma powder, adhesive remover wipes and skin prep wipes also. Pt also states the elastic band was medium instead of wide (wide band was ordered on Rx).  [place] with representative and informed her that patient did not get all supplies. Advised rep that pt needed stoma powder, wide belt, remover wipes and skin prep wipe. Rep states she will get that order to process and will also send wide belt.  Pt contacted and made aware-pt verbalized understanding.

## 2022-10-04 DIAGNOSIS — Z933 Colostomy status: Secondary | ICD-10-CM | POA: Diagnosis not present

## 2022-10-06 ENCOUNTER — Ambulatory Visit (INDEPENDENT_AMBULATORY_CARE_PROVIDER_SITE_OTHER): Payer: No Typology Code available for payment source | Admitting: Urology

## 2022-10-06 ENCOUNTER — Encounter: Payer: Self-pay | Admitting: Urology

## 2022-10-06 VITALS — BP 142/76 | HR 69

## 2022-10-06 DIAGNOSIS — N5201 Erectile dysfunction due to arterial insufficiency: Secondary | ICD-10-CM

## 2022-10-06 DIAGNOSIS — N200 Calculus of kidney: Secondary | ICD-10-CM | POA: Diagnosis not present

## 2022-10-06 LAB — URINALYSIS, ROUTINE W REFLEX MICROSCOPIC
Bilirubin, UA: NEGATIVE
Glucose, UA: NEGATIVE
Ketones, UA: NEGATIVE
Leukocytes,UA: NEGATIVE
Nitrite, UA: NEGATIVE
Specific Gravity, UA: 1.03 (ref 1.005–1.030)
Urobilinogen, Ur: 0.2 mg/dL (ref 0.2–1.0)
pH, UA: 5.5 (ref 5.0–7.5)

## 2022-10-06 LAB — MICROSCOPIC EXAMINATION: Bacteria, UA: NONE SEEN

## 2022-10-06 MED ORDER — TADALAFIL 5 MG PO TABS: 5.0000 mg | ORAL_TABLET | Freq: Every day | ORAL | 11 refills | Status: DC

## 2022-10-06 MED ORDER — TADALAFIL 20 MG PO TABS: 20.0000 mg | ORAL_TABLET | Freq: Every day | ORAL | 5 refills | Status: DC | PRN

## 2022-10-06 NOTE — Patient Instructions (Signed)

## 2022-10-06 NOTE — Progress Notes (Signed)
10/06/2022 10:52 AM   [name] 1956-03-16 [id number]  Referring provider: Coral Spikes, DO [address],  [place] [address]  [place] nephrolithiasis and erectile dysfunction   HPI: [name] is a [id number] here for followup for nephrolithiasis and erectile dysfunction. CT stone study 2/6 shows no ureteral calculi. No flank pain currently. He takes tadalafil 62m daily and 28mprn with good results   PMH: Past Medical History:  Diagnosis Date   Arthritis    hands and shoulders (left shoulder is worse than right)   [place]   after surgery for colostomy   BPH (benign prostatic hyperplasia)    [facility]   Complication of anesthesia 03/24/2014   aspiration pneumonia after surgery for colostomy, no problem with  surgeries since then   Difficulty urinating    Dyspnea    @ times due to hernias, seeing [facility] for  surgical consultation 04-20-2022, can lie flat   GERD (gastroesophageal reflux disease)    Hypertension    [place]   Incisional hernia    right abdomen   Parastomal hernia left    Rectal cancer, uT2uN0, s/p LAR/ileostomy 03/20/2014 colostomy Aug2015 12/25/2013   TCS MAY 2015    Restless leg    Right inguinal hernia    Shortening, leg, congenital    Left   Skin irritation    Surrounding ostomy site    Surgical History: Past Surgical History:  Procedure Laterality Date   COLON RESECTION N/A 03/24/2014   Procedure: Diagnostic  laparotomy, takedown of coloanal anastamosis, end colostomy, with wound vac;  Surgeon: StAdin HectorMD;  [facility]: [facility];  Service: General;  Laterality: N/A;   COLONOSCOPY N/A 12/22/2013   SLF: 1. Normal mucosa in the terminal ilium. 2. Moderate diverticulosis in the transverse colon, descending colon, and sigmoid colon. 3. Rectal bleeding due ot rectal mass and hemorrhoids 4. Moderate sized internal hemorrhoids.    COLONOSCOPY N/A 01/19/2015    Procedure: COLONOSCOPY;  Surgeon: SaDanie BinderMD;  [facility];  Service: Endoscopy;  Laterality: N/A;  1215   COLONOSCOPY N/A 03/22/2018   Procedure: COLONOSCOPY;  Surgeon: FiDanie BinderMD;  [facility];  Service: Endoscopy;  Laterality: N/A;  2:15pm   COLOSTOMY     03-24-2014   ESOPHAGOGASTRODUODENOSCOPY N/A 12/22/2013   SLF: Probable Candia esophagitis2. Probable Barretts esphagus 3. Mild gastritis.    EUS N/A 01/01/2014   Procedure: LOWER ENDOSCOPIC ULTRASOUND (EUS);  Surgeon: DaMilus BanisterMD;  [facility];  Service: Endoscopy;  Laterality: N/A;   EXAMINATION UNDER ANESTHESIA  08/25/2014   Procedure: EXAM UNDER ANESTHESIA;  Surgeon: StMichael BostonMD;  [facility];  Service: General;;   EXTRACORPOREAL SHOCK WAVE LITHOTRIPSY Right 07/25/2022   Procedure: EXTRACORPOREAL SHOCK WAVE LITHOTRIPSY (ESWL);  Surgeon: McCleon GustinMD;  Location: [facility];  Service: Urology;  Laterality: Right;   eye reattachment and ear reattachment  Right 08/21/1969   FLEXIBLE SIGMOIDOSCOPY N/A 03/24/2014   Procedure: FLEXIBLE SIGMOIDOSCOPY;  Surgeon: StAdin HectorMD;  [facility];  Service: General;  Laterality: N/A;   HIP FRACTURE SURGERY Left age [age]n 04-06-1970   ILEOSTOMY     ILEOSTOMY CLOSURE N/A 08/25/2014   Procedure: TAKEDOWN OF LOOP ILEOSTOMY ;  Surgeon: StMichael BostonMD;  Location[facility];  Service: General;  Laterality: N/A;   INSERTION OF MESH N/A 04/11/2017   Procedure: INSERTION OF  MESH ;  Surgeon: Michael Boston, MD;  [facility];  Service: General;  Laterality: N/A;  BILATERAL TAP BLOCK   THULIUM LASER TURP (TRANSURETHRAL RESECTION OF PROSTATE) N/A 04/04/2022   Procedure: THULIUM LASER TURP (TRANSURETHRAL RESECTION OF PROSTATE);  Surgeon: Festus Aloe, MD;  Location: [facility];  Service: Urology;  Laterality: N/A;   TOTAL HIP ARTHROPLASTY Left 11/19/2015   Procedure: LEFT TOTAL HIP ARTHROPLASTY ANTERIOR  APPROACH;  Surgeon: Mcarthur Rossetti, MD;  [facility];  Service: Orthopedics;  Laterality: Left;   VENTRAL HERNIA REPAIR N/A 04/11/2017   Procedure: LAPAROSCOPIC INCARCERATED PERISTOMAL AND INCARCERATED INCISIONAL HERNIA X2;  Surgeon: Michael Boston, MD;  [facility];  Service: General;  Laterality: N/A;  BILATERAL TAP BLOCK    Home Medications:  Allergies as of 10/06/2022   No Known Allergies      Medication List        Accurate as of October 06, 2022 10:52 AM. If you have any questions, ask your nurse or doctor.          amLODipine 5 MG tablet Commonly known as: NORVASC Take 1 tablet (5 mg total) by mouth daily.   ascorbic acid 500 MG tablet Commonly known as: VITAMIN C Take 500 mg by mouth 2 (two) times daily.   aspirin EC 81 MG tablet Take 81 mg by mouth daily.   baclofen 10 MG tablet Commonly known as: LIORESAL Take 1 tablet (10 mg total) by mouth at bedtime as needed for muscle spasms. For leg cramping What changed: when to take this   co-enzyme Q-10 30 MG capsule Take 30 mg by mouth daily.   cyanocobalamin 500 MCG tablet Commonly known as: VITAMIN B12 Take 1,000 mcg by mouth daily.   cyclobenzaprine 5 MG tablet Commonly known as: FLEXERIL Take 1 tablet (5 mg total) by mouth 3 (three) times daily as needed for muscle spasms.   LUTEIN PO Take 1 tablet by mouth 2 (two) times daily.   metoprolol tartrate 25 MG tablet Commonly known as: LOPRESSOR Take 1 tablet (25 mg total) by mouth 2 (two) times daily.   omeprazole 40 MG capsule Commonly known as: PRILOSEC TAKE 1 CAPSULE (40 MG TOTAL) BY MOUTH DAILY.   ondansetron 8 MG disintegrating tablet Commonly known as: ZOFRAN-ODT Take 1 tablet (8 mg total) by mouth every 8 (eight) hours as needed for nausea or vomiting.   ONE DAILY FOR MEN 50+ ADVANCED PO Take 1 tablet by mouth daily.   OVER THE COUNTER MEDICATION Take 1 tablet by mouth 2 (two) times daily. Vinegar tablet by mouth twice a  day   oxyCODONE 5 MG immediate release tablet Commonly known as: Roxicodone Take 1 tablet (5 mg total) by mouth every 4 (four) hours as needed for severe pain.   oxyCODONE-acetaminophen 5-325 MG tablet Commonly known as: Percocet Take 1 tablet by mouth every 4 (four) hours as needed for moderate pain.   rosuvastatin 10 MG tablet Commonly known as: CRESTOR TAKE 1 TABLET BY MOUTH EVERY DAY   tadalafil 20 MG tablet Commonly known as: CIALIS Take 1 tablet (20 mg total) by mouth daily as needed.   tadalafil 5 MG tablet Commonly known as: CIALIS Take 1 tablet (5 mg total) by mouth daily.   tamsulosin 0.4 MG Caps capsule Commonly known as: FLOMAX Take 1 capsule (0.4 mg total) by mouth daily after supper.   Turmeric 500 MG Caps Take 500 mg by mouth 2 (two) times daily.   vitamin A  25000 UNIT capsule Take 25,000 Units by mouth daily.        Allergies: No Known Allergies  Family History: Family History  Problem Relation Age of Onset   Colon cancer Sister    Emphysema Mother    Heart attack Brother    Colon polyps Neg Hx     Social History:  reports that he has quit smoking. His smoking use included cigars. He has never used smokeless tobacco. He reports that he does not drink alcohol and does not use drugs.  ROS: All other review of systems were reviewed and are negative except what is noted above in HPI  Physical Exam: BP (!) 142/76   Pulse 69   Constitutional:  Alert and oriented, No acute distress. HEENT: [place] AT, moist mucus membranes.  Trachea midline, no masses. Cardiovascular: No clubbing, cyanosis, or edema. Respiratory: Normal respiratory effort, no increased work of breathing. GI: Abdomen is soft, nontender, nondistended, no abdominal masses GU: No CVA tenderness.  Lymph: No cervical or inguinal lymphadenopathy. Skin: No rashes, bruises or suspicious lesions. Neurologic: Grossly intact, no focal deficits, moving all 4 extremities. Psychiatric: Normal mood  and affect.  Laboratory Data: Lab Results  Component Value Date   WBC 10.0 07/22/2022   HGB 13.0 07/22/2022   HCT 40.0 07/22/2022   MCV 89.3 07/22/2022   PLT 302 07/22/2022    Lab Results  Component Value Date   CREATININE 0.91 07/22/2022    No results found for: "PSA"  No results found for: "TESTOSTERONE"  Lab Results  Component Value Date   HGBA1C 5.7 (H) 03/14/2022    Urinalysis    Component Value Date/Time   COLORURINE YELLOW 07/22/2022 0115   APPEARANCEUR Clear 09/19/2022 1414   LABSPEC 1.014 07/22/2022 0115   PHURINE 6.0 07/22/2022 0115   GLUCOSEU Negative 09/19/2022 1414   HGBUR LARGE (A) 07/22/2022 0115   BILIRUBINUR Negative 09/19/2022 1414   KETONESUR NEGATIVE 07/22/2022 0115   PROTEINUR Trace 09/19/2022 1414   PROTEINUR NEGATIVE 07/22/2022 0115   UROBILINOGEN 0.2 04/05/2014 0506   NITRITE Negative 09/19/2022 1414   NITRITE NEGATIVE 07/22/2022 0115   LEUKOCYTESUR Negative 09/19/2022 1414   LEUKOCYTESUR NEGATIVE 07/22/2022 0115    Lab Results  Component Value Date   LABMICR Comment 09/19/2022   WBCUA 0-5 09/06/2022   LABEPIT 0-10 09/06/2022   MUCUS Present (A) 09/06/2022   BACTERIA None seen 09/06/2022    Pertinent Imaging: Ct 09/26/2022: Images reviewed and discussed with the patient  Results for orders placed during the hospital encounter of 09/06/22  Abdomen 1 view (KUB)  Narrative CLINICAL DATA:  Nephrolithiasis  EXAM: ABDOMEN - 1 VIEW  COMPARISON:  08/08/2022.  FINDINGS: The bowel gas pattern is normal. No radio-opaque calculi or other significant radiographic abnormality are seen. Parenchymal sutures right midabdomen. Left lower quadrant ostomy.  IMPRESSION: Negative. Noncontrast CT would be more definitive for the evaluation of the presence of nephrolithiasis, if indicated.   Electronically Signed By: Sammie Bench M.D. On: 09/07/2022 11:54  No results found for this or any previous visit.  No results found for this  or any previous visit.  No results found for this or any previous visit.  Results for orders placed during the hospital encounter of 09/14/22  Ultrasound renal complete  Narrative CLINICAL DATA:  History of nephrolithiasis.  EXAM: RENAL / URINARY TRACT ULTRASOUND COMPLETE  COMPARISON:  CT renal stone protocol 07/21/2022  FINDINGS: Right Kidney:  Renal measurements: 11.7 x 5.2 x 5.4 cm = volume: 171.9  mL. Echogenicity within normal limits. No mass or hydronephrosis visualized.  Left Kidney:  Renal measurements: 11.3 x 6.1 x 6.1 cm = volume: 219.5 mL. Echogenicity within normal limits. No mass or hydronephrosis visualized.  Bladder:  Appears normal for degree of bladder distention.  Other:  None.  IMPRESSION: No hydronephrosis.   Electronically Signed By: Lovey Newcomer M.D. On: 09/14/2022 12:36  No valid procedures specified. No results found for this or any previous visit.  Results for orders placed in visit on 09/19/22  CT RENAL STONE STUDY  Narrative CLINICAL DATA:  Right flank pain since a lithotripsy in December 2023  EXAM: CT ABDOMEN AND PELVIS WITHOUT CONTRAST  TECHNIQUE: Multidetector CT imaging of the abdomen and pelvis was performed following the standard protocol without IV contrast.  RADIATION DOSE REDUCTION: This exam was performed according to the departmental dose-optimization program which includes automated exposure control, adjustment of the mA and/or kV according to patient size and/or use of iterative reconstruction technique.  COMPARISON:  CT scan 07/21/2022  FINDINGS: Lower chest: The lung bases are clear of acute process. No pleural effusion or pulmonary lesions. The heart is normal in size. No pericardial effusion. The distal esophagus and aorta are unremarkable.  Hepatobiliary: No hepatic lesions or intrahepatic biliary dilatation. Gallbladder is mildly contracted. No common bile duct dilatation.  Pancreas: No mass,  inflammation or ductal dilatation.  Spleen: Normal size.  No focal lesions.  Adrenals/Urinary Tract: The adrenal glands are normal.  Small bilateral renal calculi but no obstructing ureteral calculi or bladder calculi.  Stomach/Bowel: The stomach, duodenum and small bowel are grossly normal. No inflammatory changes, mass lesions or obstructive findings. Stable surgical changes from prior APR and left hemicolectomy with transverse colostomy. Small parastomal hernia noted. The terminal ileum and appendix are normal.  Vascular/Lymphatic: Stable aortic and branch vessel calcifications but no aneurysm. No mesenteric or retroperitoneal mass or adenopathy. Retroaortic left renal vein noted.  Reproductive: The prostate gland is enlarged. Scattered calcifications. The seminal vesicles are unremarkable.  Other: No pelvic mass or adenopathy. No free pelvic fluid collections. No inguinal mass or adenopathy. No abdominal wall hernia or subcutaneous lesions.  Musculoskeletal: No significant bony findings. Artifact related to a left total hip arthroplasty.  IMPRESSION: 1. Small bilateral renal calculi but no obstructing ureteral calculi or bladder calculi. 2. Stable surgical changes from prior APR and left hemicolectomy with transverse colostomy. Small parastomal hernia noted. 3. No acute abdominal/pelvic findings, mass lesions or adenopathy.  Aortic Atherosclerosis (ICD10-I70.0).   Electronically Signed By: Marijo Sanes M.D. On: 09/26/2022 17:16   Assessment & Plan:    1. Kidney stones -followup 3 months with renal US - Urinalysis, Routine w reflex microscopic  2. Erectile dysfunction due to arterial insufficiency Continue tadalafil 42m daily and 223mprn   No follow-ups on file.  PaNicolette BangMD  [facility] [place]

## 2022-10-10 ENCOUNTER — Ambulatory Visit: Payer: No Typology Code available for payment source | Admitting: Family Medicine

## 2022-10-10 ENCOUNTER — Encounter: Payer: Self-pay | Admitting: Family Medicine

## 2022-10-10 VITALS — BP 131/85 | HR 67 | Temp 97.4°F | Ht 65.0 in | Wt 193.0 lb

## 2022-10-10 DIAGNOSIS — R0609 Other forms of dyspnea: Secondary | ICD-10-CM | POA: Diagnosis not present

## 2022-10-10 DIAGNOSIS — Z933 Colostomy status: Secondary | ICD-10-CM | POA: Diagnosis not present

## 2022-10-10 DIAGNOSIS — I10 Essential (primary) hypertension: Secondary | ICD-10-CM

## 2022-10-10 DIAGNOSIS — E785 Hyperlipidemia, unspecified: Secondary | ICD-10-CM

## 2022-10-10 MED ORDER — AMLODIPINE BESYLATE 5 MG PO TABS: 5.0000 mg | ORAL_TABLET | Freq: Every day | ORAL | 3 refills | Status: DC

## 2022-10-10 MED ORDER — METOPROLOL TARTRATE 25 MG PO TABS: 25.0000 mg | ORAL_TABLET | Freq: Two times a day (BID) | ORAL | 3 refills | Status: DC

## 2022-10-10 NOTE — Assessment & Plan Note (Signed)
Stable.  Continue metoprolol and amlodipine.

## 2022-10-10 NOTE — Assessment & Plan Note (Signed)
Fairly well-controlled.  Continue Crestor.  Planning for labs at next visit.

## 2022-10-10 NOTE — Assessment & Plan Note (Signed)
Rx sent for belt for his colostomy.

## 2022-10-10 NOTE — Progress Notes (Signed)
Subjective:  Patient ID: [name], male    DOB: 03/12/1956  Age: 67 y.o. MRN: [id number]  CC: Chief Complaint  Patient presents with   Hypertension    HPI:  67 year old male with hypertension, history of rectal cancer with ostomy in place, hernia, GERD, hyperlipidemia presents for follow-up.  Patient reports overall he is doing fairly well.  He does note that he has had shortness of breath with exertion.  He said prior workup with stress test and echo through cardiology.  He states that he continues to have shortness of breath with exertion.  No chest pain.  Limits his normal activities.  No other associated symptoms.  Hypertension has been well-controlled on amlodipine and metoprolol.  Lipids have been fairly well-controlled on Crestor.  Patient requesting a prescription for a belt regarding his colostomy.  Also requesting a handicap placard.  Patient Active Problem List   Diagnosis Date Noted   DOE (dyspnea on exertion) 10/10/2022   Incisional hernia 02/16/2022   Parastomal hernia 02/16/2022   Skin cyst 02/16/2022   Hyperlipidemia 09/14/2021   Incarcerated incisional hernia s/p lap repair with mesh 04/11/2017 04/11/2017   Colostomy in place [facility])    Essential hypertension, benign 05/12/2014   Obesity (BMI 30-39.9) 03/24/2014   Rectal cancer, uT2uN0, s/p LAR/ileostomy 03/20/2014 colostomy Aug2015 12/25/2013   GERD (gastroesophageal reflux disease) 11/27/2013    Social Hx   Social History   Socioeconomic History   Marital status: Married    Spouse name: Not on file   Number of children: Not on file   Years of education: Not on file   Highest education level: Not on file  Occupational History   Not on file  Tobacco Use   Smoking status: Former    Types: Cigars   Smokeless tobacco: Never  Vaping Use   Vaping Use: Never used  Substance and Sexual Activity   Alcohol use: No    Comment: quit years ago   Drug use: No   Sexual activity: Not on file  Other  Topics Concern   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   Social Determinants of Health   Financial Resource Strain: Low Risk  (02/14/2022)   Overall Financial Resource Strain (CARDIA)    Difficulty of Paying Living Expenses: Not hard at all  Food Insecurity: No Food Insecurity (02/14/2022)   Hunger Vital Sign    Worried About Running Out of Food in the Last Year: Never true    Ran Out of Food in the Last Year: Never true  Transportation Needs: No Transportation Needs (02/14/2022)   PRAPARE - Hydrologist (Medical): No    Lack of Transportation (Non-Medical): No  Physical Activity: Sufficiently Active (02/14/2022)   Exercise Vital Sign    Days of Exercise per Week: 5 days    Minutes of Exercise per Session: 30 min  Stress: No Stress Concern Present (02/14/2022)   [place]    Feeling of Stress : Not at all  Social Connections: [place] (02/14/2022)   Social Connection and Isolation Panel [NHANES]    Frequency of Communication with Friends and Family: More than three times a week    Frequency of Social Gatherings with Friends and Family: More than three times a week    Attends Religious Services: More than 4 times per year    Active Member of [employer] or Organizations: Yes  Attends Archivist Meetings: More than 4 times per year    Marital Status: Married    Review of Systems Per HPI  Objective:  BP 131/85   Pulse 67   Temp (!) 97.4 F (36.3 C)   Ht 5' 5"$  (1.651 m)   Wt 193 lb (87.5 kg)   SpO2 98%   BMI 32.12 kg/m      10/10/2022    8:44 AM 10/10/2022    8:36 AM 10/06/2022   10:42 AM  BP/Weight  Systolic BP [id number]  Diastolic BP 85 86 76  Wt. (Lbs)  193   BMI  32.12 kg/m2     Physical Exam Vitals and nursing note reviewed.  Constitutional:      Appearance: Normal appearance.  HENT:     Head: Normocephalic  and atraumatic.  Cardiovascular:     Rate and Rhythm: Normal rate and regular rhythm.  Pulmonary:     Effort: Pulmonary effort is normal.     Breath sounds: Normal breath sounds. No wheezing or rales.  Abdominal:     Palpations: Abdomen is soft.     Tenderness: There is no abdominal tenderness.  Neurological:     Mental Status: He is alert.  Psychiatric:        Mood and Affect: Mood normal.        Behavior: Behavior normal.     Lab Results  Component Value Date   WBC 10.0 07/22/2022   HGB 13.0 07/22/2022   HCT 40.0 07/22/2022   PLT 302 07/22/2022   GLUCOSE 131 (H) 07/22/2022   CHOL 151 03/14/2022   TRIG 158 (H) 03/14/2022   HDL 38 (L) 03/14/2022   LDLCALC 85 03/14/2022   ALT 28 07/22/2022   AST 21 07/22/2022   NA 137 07/22/2022   K 3.6 07/22/2022   CL 105 07/22/2022   CREATININE 0.91 07/22/2022   BUN 24 (H) 07/22/2022   CO2 25 07/22/2022   TSH 2.029 10/18/2018   INR 0.99 12/25/2013   HGBA1C 5.7 (H) 03/14/2022     Assessment & Plan:   Problem List Items Addressed This Visit       Cardiovascular and Mediastinum   Essential hypertension, benign (Chronic)    Stable.  Continue metoprolol and amlodipine.      Relevant Medications   amLODipine (NORVASC) 5 MG tablet   metoprolol tartrate (LOPRESSOR) 25 MG tablet     Other   Colostomy in place [facility]) (Chronic)    Rx sent for belt for his colostomy.      Hyperlipidemia    Fairly well-controlled.  Continue Crestor.  Planning for labs at next visit.      Relevant Medications   amLODipine (NORVASC) 5 MG tablet   metoprolol tartrate (LOPRESSOR) 25 MG tablet   DOE (dyspnea on exertion) - Primary    Prior stress testing in 2019 revealed consistent with prior inferior/apical myocardial infarction with mild peri-infarct ischemia. Low risk.  Given persistent SOB with exertion, referring back to cardiology for evaluation.       Relevant Orders   Ambulatory referral to Cardiology    Meds ordered this encounter   Medications   amLODipine (NORVASC) 5 MG tablet    Sig: Take 1 tablet (5 mg total) by mouth daily.    Dispense:  90 tablet    Refill:  3   metoprolol tartrate (LOPRESSOR) 25 MG tablet    Sig: Take 1 tablet (25 mg total) by mouth 2 (two) times daily.  Dispense:  180 tablet    Refill:  3    Follow-up:  Return in about 6 months (around 04/10/2023).  [place]

## 2022-10-10 NOTE — Patient Instructions (Signed)
Continue your meds.  Cardiology referral placed.  Follow up in 6 months.

## 2022-10-10 NOTE — Assessment & Plan Note (Signed)
Prior stress testing in 2019 revealed consistent with prior inferior/apical myocardial infarction with mild peri-infarct ischemia. Low risk.  Given persistent SOB with exertion, referring back to cardiology for evaluation.

## 2022-10-11 DIAGNOSIS — Z933 Colostomy status: Secondary | ICD-10-CM | POA: Diagnosis not present

## 2022-12-14 ENCOUNTER — Ambulatory Visit: Payer: No Typology Code available for payment source | Attending: Cardiology | Admitting: Cardiology

## 2022-12-14 ENCOUNTER — Encounter: Payer: Self-pay | Admitting: Cardiology

## 2022-12-14 VITALS — BP 138/82 | HR 85 | Ht 65.0 in | Wt 195.0 lb

## 2022-12-14 DIAGNOSIS — R0609 Other forms of dyspnea: Secondary | ICD-10-CM

## 2022-12-14 DIAGNOSIS — I259 Chronic ischemic heart disease, unspecified: Secondary | ICD-10-CM | POA: Diagnosis not present

## 2022-12-14 DIAGNOSIS — E782 Mixed hyperlipidemia: Secondary | ICD-10-CM | POA: Diagnosis not present

## 2022-12-14 DIAGNOSIS — I10 Essential (primary) hypertension: Secondary | ICD-10-CM | POA: Diagnosis not present

## 2022-12-14 NOTE — Progress Notes (Signed)
Cardiology Office Note  Date: 12/14/2022   ID: [name], DOB 26-Mar-1956, MRN [id number]  PCP: Tommie Sams, DO  Chief Complaint:  Chief Complaint  Patient presents with   Shortness of Breath   History of Present Illness: [name] is a 67 y.o. male referred back to the office for cardiology consultation by Dr. Adriana Simas for the evaluation of shortness of breath.  He was seen for similar reasons back in 2019 at which point [facility] revealed evidence of inferior apical scar with mild peri-infarct ischemia and an LVEF of 50% (echocardiogram revealed LVEF 55 to 60% without regional wall motion abnormalities however).  Further cardiac testing was not pursued at that time.  He describes more noticeable dyspnea on exertion over the last several months.  No chest tightness, but does have to stop and rest in particular when he is doing outside chores.  He is fairly active, works as a Visual merchandiser.  No palpitations or syncope.  I reviewed his medications.  Current regimen includes aspirin, Norvasc, Lopressor, and Crestor.  He does use Cialis as needed and is not on a long-acting nitrate.  Has also had hematuria in the setting of nephrolithiasis, status post lithotripsy and reportedly improving.  He follows with urology.  He has abdominal wall hernias with prior colon surgery and a longstanding colostomy.  Review of Systems: As outlined in the history of present illness.  No orthopnea or PND.  No claudication.  Past Medical History: Past Medical History:  Diagnosis Date   Arthritis    hands and shoulders (left shoulder is worse than right)   Aspiration pneumonia 03/24/2014   after surgery for colostomy   BPH (benign prostatic hyperplasia)    Colostomy in place    Complication of anesthesia 03/24/2014   aspiration pneumonia after surgery for colostomy, no problem with  surgeries since then   Difficulty urinating    Dyspnea    @ times due to hernias, seeing [facility] for   surgical consultation 04-20-2022, can lie flat   GERD (gastroesophageal reflux disease)    Hypertension    Ileostomy present    Incisional hernia    right abdomen   Parastomal hernia left    Rectal cancer, uT2uN0, s/p LAR/ileostomy 03/20/2014 colostomy Aug2015 12/25/2013   TCS MAY 2015    Restless leg    Right inguinal hernia    Shortening, leg, congenital    Left   Skin irritation    Surrounding ostomy site   Past Surgical History: Past Surgical History:  Procedure Laterality Date   COLON RESECTION N/A 03/24/2014   Procedure: Diagnostic  laparotomy, takedown of coloanal anastamosis, end colostomy, with wound vac;  Surgeon: Ardeth Sportsman, MD;  Location: [facility];  Service: General;  Laterality: N/A;   COLONOSCOPY N/A 12/22/2013   SLF: 1. Normal mucosa in the terminal ilium. 2. Moderate diverticulosis in the transverse colon, descending colon, and sigmoid colon. 3. Rectal bleeding due ot rectal mass and hemorrhoids 4. Moderate sized internal hemorrhoids.    COLONOSCOPY N/A 01/19/2015   Procedure: COLONOSCOPY;  Surgeon: West Bali, MD;  Location: [facility];  Service: Endoscopy;  Laterality: N/A;  1215   COLONOSCOPY N/A 03/22/2018   Procedure: COLONOSCOPY;  Surgeon: West Bali, MD;  Location: [facility];  Service: Endoscopy;  Laterality: N/A;  2:15pm   COLOSTOMY     03-24-2014   ESOPHAGOGASTRODUODENOSCOPY N/A 12/22/2013   SLF: Probable Candia esophagitis2. Probable Barretts esphagus 3. Mild gastritis.  EUS N/A 01/01/2014   Procedure: LOWER ENDOSCOPIC ULTRASOUND (EUS);  Surgeon: Rachael Fee, MD;  Location: [facility];  Service: Endoscopy;  Laterality: N/A;   EXAMINATION UNDER ANESTHESIA  08/25/2014   Procedure: EXAM UNDER ANESTHESIA;  Surgeon: Karie Soda, MD;  Location: [facility];  Service: General;;   EXTRACORPOREAL SHOCK WAVE LITHOTRIPSY Right 07/25/2022   Procedure: EXTRACORPOREAL SHOCK WAVE LITHOTRIPSY (ESWL);  Surgeon: Malen Gauze, MD;  Location: [facility];   Service: Urology;  Laterality: Right;   eye reattachment and ear reattachment  Right 08/21/1969   FLEXIBLE SIGMOIDOSCOPY N/A 03/24/2014   Procedure: FLEXIBLE SIGMOIDOSCOPY;  Surgeon: Ardeth Sportsman, MD;  Location: [facility];  Service: General;  Laterality: N/A;   HIP FRACTURE SURGERY Left age 70 on 04-06-1970   ILEOSTOMY     ILEOSTOMY CLOSURE N/A 08/25/2014   Procedure: TAKEDOWN OF LOOP ILEOSTOMY ;  Surgeon: Karie Soda, MD;  Location: [facility];  Service: General;  Laterality: N/A;   INSERTION OF MESH N/A 04/11/2017   Procedure: INSERTION OF MESH ;  Surgeon: Karie Soda, MD;  Location: [facility];  Service: General;  Laterality: N/A;  BILATERAL TAP BLOCK   THULIUM LASER TURP (TRANSURETHRAL RESECTION OF PROSTATE) N/A 04/04/2022   Procedure: THULIUM LASER TURP (TRANSURETHRAL RESECTION OF PROSTATE);  Surgeon: Jerilee Field, MD;  Location: [facility];  Service: Urology;  Laterality: N/A;   TOTAL HIP ARTHROPLASTY Left 11/19/2015   Procedure: LEFT TOTAL HIP ARTHROPLASTY ANTERIOR APPROACH;  Surgeon: Kathryne Hitch, MD;  Location: [facility];  Service: Orthopedics;  Laterality: Left;   VENTRAL HERNIA REPAIR N/A 04/11/2017   Procedure: LAPAROSCOPIC INCARCERATED PERISTOMAL AND INCARCERATED INCISIONAL HERNIA X2;  Surgeon: Karie Soda, MD;  Location: [facility];  Service: General;  Laterality: N/A;  BILATERAL TAP BLOCK   Family History: Family History  Problem Relation Age of Onset   Colon cancer Sister    Emphysema Mother    Heart attack Brother    Colon polyps Neg Hx    Social History:  Social History   Tobacco Use   Smoking status: Former    Types: Cigars   Smokeless tobacco: Never  Substance Use Topics   Alcohol use: No    Comment: quit years ago   Medications: Current Outpatient Medications on File Prior to Visit  Medication Sig Dispense Refill   amLODipine (NORVASC) 5 MG tablet Take 1 tablet (5 mg total) by mouth daily. 90 tablet 3   aspirin EC 81 MG tablet Take 81  mg by mouth daily.     baclofen (LIORESAL) 10 MG tablet Take 1 tablet (10 mg total) by mouth at bedtime as needed for muscle spasms. For leg cramping (Patient taking differently: Take 10 mg by mouth at bedtime. For leg cramping) 90 tablet 3   Beta Carotene (VITAMIN A) 25000 UNIT capsule Take 25,000 Units by mouth daily.     co-enzyme Q-10 30 MG capsule Take 30 mg by mouth daily.     cyclobenzaprine (FLEXERIL) 5 MG tablet Take 1 tablet (5 mg total) by mouth 3 (three) times daily as needed for muscle spasms. 30 tablet 0   metoprolol tartrate (LOPRESSOR) 25 MG tablet Take 1 tablet (25 mg total) by mouth 2 (two) times daily. 180 tablet 3   omeprazole (PRILOSEC) 40 MG capsule TAKE 1 CAPSULE (40 MG TOTAL) BY MOUTH DAILY. 90 capsule 1   OVER THE COUNTER MEDICATION Take 1 tablet by mouth 2 (two) times daily. Vinegar tablet by mouth twice a day  rosuvastatin (CRESTOR) 10 MG tablet TAKE 1 TABLET BY MOUTH EVERY DAY 90 tablet 0   tadalafil (CIALIS) 20 MG tablet Take 1 tablet (20 mg total) by mouth daily as needed. 10 tablet 5   tadalafil (CIALIS) 5 MG tablet Take 1 tablet (5 mg total) by mouth daily. 30 tablet 11   Turmeric 500 MG CAPS Take 500 mg by mouth 2 (two) times daily.      vitamin B-12 (CYANOCOBALAMIN) 500 MCG tablet Take 1,000 mcg by mouth daily.     vitamin C (ASCORBIC ACID) 500 MG tablet Take 500 mg by mouth 2 (two) times daily.     No current facility-administered medications on file prior to visit.   Allergies: No Known Allergies Physical Exam: VS:  BP 138/82   Pulse 85   Ht 5\' 5"  (1.651 m)   Wt 195 lb (88.5 kg)   SpO2 95%   BMI 32.45 kg/m , BMI Body mass index is 32.45 kg/m.  Wt Readings from Last 3 Encounters:  12/14/22 195 lb (88.5 kg)  10/10/22 193 lb (87.5 kg)  08/08/22 194 lb 0.2 oz (88 kg)    General: Patient appears comfortable at rest. HEENT: Conjunctiva and lids normal. Neck: Supple, no elevated JVP or carotid bruits. Lungs: Clear to auscultation, nonlabored  breathing at rest. Cardiac: Regular rate and rhythm, no S3 or significant systolic murmur. Abdomen: Soft, bowel sounds present, colostomy bag. Extremities: No pitting edema, distal pulses 2+. Skin: Warm and dry. Musculoskeletal: No kyphosis. Neuropsychiatric: Alert and oriented x3, affect grossly appropriate.  ECG:  An ECG dated 04/04/2022 was personally reviewed today and demonstrated:  Sinus bradycardia with prolonged PR interval, possible old inferior infarct pattern.  Labwork: 07/22/2022: ALT 28; AST 21; BUN 24; Creatinine, Ser 0.91; Hemoglobin 13.0; Platelets 302; Potassium 3.6; Sodium 137     Component Value Date/Time   CHOL 151 03/14/2022 1057   TRIG 158 (H) 03/14/2022 1057   HDL 38 (L) 03/14/2022 1057   CHOLHDL 4.0 03/14/2022 1057   LDLCALC 85 03/14/2022 1057   Other Studies Reviewed Today:  No interval cardiac testing for review today.  Assessment and Plan:  1.  Progressive dyspnea on exertion in a 67 year old male with ischemic heart disease based on prior Lexiscan Myoview in 2019 indicating inferior apical scar with mild peri-infarct ischemia managed medically at that point.  Current regimen includes aspirin, Norvasc, Lopressor, and Crestor.  Plan to obtain a follow-up echocardiogram and Lexiscan Myoview for reevaluation.  If there has been significant progression may need to consider cardiac catheterization.  Cannot add long-acting nitrates given intermittent use of Cialis.  Might be able to further uptitrate beta-blocker for antianginal effect.  2.  Mixed hyperlipidemia, LDL 85 in July 2023.  He has been on Crestor 10 mg daily most recently.  Due for follow-up lab work with PCP this summer.  Would recommend getting his LDL closer to 55 if possible, may need up titration of statin dose.  3.  History of rectal cancer status post prior colon surgery and ultimately longstanding colostomy.  4.  Nephrolithiasis with intermittent hematuria status post lithotripsy.  Reportedly this  is improving.  Important to consider if he were to undergo PCI and need for DAPT.  5.  Essential hypertension, systolic in the 130s today.  No changes made to current regimen.  Disposition:  Follow up  test results.  Signed, Jonelle Sidle, M.D., F.A.C.C. [facility]

## 2022-12-14 NOTE — Patient Instructions (Signed)
Medication Instructions:  Your physician recommends that you continue on your current medications as directed. Please refer to the Current Medication list given to you today.   Labwork: None today  Testing/Procedures: Your physician has requested that you have an echocardiogram. Echocardiography is a painless test that uses sound waves to create images of your heart. It provides your doctor with information about the size and shape of your heart and how well your heart's chambers and valves are working. This procedure takes approximately one hour. There are no restrictions for this procedure. Please do NOT wear cologne, perfume, aftershave, or lotions (deodorant is allowed). Please arrive 15 minutes prior to your appointment time.   Your physician has requested that you have a lexiscan myoview. For further information please visit [email or web address]. Please follow instruction sheet, as given.   Follow-Up: We will call you with results   Any Other Special Instructions Will Be Listed Below (If Applicable).  If you need a refill on your cardiac medications before your next appointment, please call your pharmacy.  

## 2022-12-19 ENCOUNTER — Telehealth: Payer: Self-pay | Admitting: Family Medicine

## 2022-12-19 ENCOUNTER — Other Ambulatory Visit: Payer: Self-pay | Admitting: Family Medicine

## 2022-12-19 MED ORDER — ROSUVASTATIN CALCIUM 10 MG PO TABS: 10.0000 mg | ORAL_TABLET | Freq: Every day | ORAL | 0 refills | Status: DC

## 2022-12-19 NOTE — Telephone Encounter (Signed)
Refill on    rosuvastatin (CRESTOR) 10 MG tablet  [employer] fax [phone] form is in your box

## 2022-12-25 ENCOUNTER — Encounter (HOSPITAL_COMMUNITY): Payer: No Typology Code available for payment source

## 2022-12-25 ENCOUNTER — Encounter (HOSPITAL_COMMUNITY): Payer: Self-pay

## 2022-12-29 ENCOUNTER — Other Ambulatory Visit: Payer: Self-pay | Admitting: Family Medicine

## 2023-01-02 ENCOUNTER — Ambulatory Visit (HOSPITAL_COMMUNITY)
Admission: RE | Admit: 2023-01-02 | Discharge: 2023-01-02 | Disposition: A | Discharge status: Home / Self Care | Payer: No Typology Code available for payment source | Source: Ambulatory Visit | Attending: Cardiology | Admitting: Cardiology

## 2023-01-02 ENCOUNTER — Ambulatory Visit (HOSPITAL_BASED_OUTPATIENT_CLINIC_OR_DEPARTMENT_OTHER)
Admission: RE | Admit: 2023-01-02 | Discharge: 2023-01-02 | Disposition: A | Discharge status: Home / Self Care | Payer: No Typology Code available for payment source | Source: Ambulatory Visit | Attending: Cardiology | Admitting: Cardiology

## 2023-01-02 DIAGNOSIS — R0609 Other forms of dyspnea: Secondary | ICD-10-CM | POA: Diagnosis not present

## 2023-01-02 LAB — NM MYOCAR MULTI W/SPECT W/WALL MOTION / EF
Base ST Depression (mm): 0 mm
LV dias vol: 110 mL (ref 62–150)
LV sys vol: 46 mL
Nuc Stress EF: 58 %
Peak HR: 88 {beats}/min
RATE: 0.5
Rest HR: 58 {beats}/min
Rest Nuclear Isotope Dose: 10 mCi
SDS: 2
SRS: 4
SSS: 6
ST Depression (mm): 0 mm
Stress Nuclear Isotope Dose: 30 mCi
TID: 1.19

## 2023-01-02 MED ORDER — REGADENOSON 0.4 MG/5ML IV SOLN
INTRAVENOUS | Status: AC
  Administered 2023-01-02: 0.4 mg via INTRAVENOUS
  Filled 2023-01-02: qty 5

## 2023-01-02 MED ORDER — SODIUM CHLORIDE FLUSH 0.9 % IV SOLN
INTRAVENOUS | Status: AC
  Administered 2023-01-02: 10 mL via INTRAVENOUS
  Filled 2023-01-02: qty 10

## 2023-01-02 MED ORDER — TECHNETIUM TC 99M TETROFOSMIN IV KIT
10.0000 | PACK | Freq: Once | INTRAVENOUS | Status: AC | PRN
  Administered 2023-01-02: 10 via INTRAVENOUS

## 2023-01-02 MED ORDER — TECHNETIUM TC 99M TETROFOSMIN IV KIT
30.0000 | PACK | Freq: Once | INTRAVENOUS | Status: AC | PRN
  Administered 2023-01-02: 30 via INTRAVENOUS

## 2023-01-04 ENCOUNTER — Ambulatory Visit (HOSPITAL_COMMUNITY)
Admission: RE | Admit: 2023-01-04 | Discharge: 2023-01-04 | Disposition: A | Discharge status: Home / Self Care | Payer: No Typology Code available for payment source | Source: Ambulatory Visit | Attending: Urology | Admitting: Urology

## 2023-01-04 DIAGNOSIS — N200 Calculus of kidney: Secondary | ICD-10-CM | POA: Insufficient documentation

## 2023-01-10 ENCOUNTER — Other Ambulatory Visit (HOSPITAL_COMMUNITY): Payer: No Typology Code available for payment source

## 2023-01-10 ENCOUNTER — Ambulatory Visit: Payer: No Typology Code available for payment source | Admitting: Urology

## 2023-01-11 ENCOUNTER — Ambulatory Visit (HOSPITAL_COMMUNITY)
Admission: RE | Admit: 2023-01-11 | Discharge: 2023-01-11 | Disposition: A | Discharge status: Home / Self Care | Payer: No Typology Code available for payment source | Source: Ambulatory Visit | Attending: Cardiology | Admitting: Cardiology

## 2023-01-11 DIAGNOSIS — R0609 Other forms of dyspnea: Secondary | ICD-10-CM | POA: Insufficient documentation

## 2023-01-11 LAB — ECHOCARDIOGRAM COMPLETE
Area-P 1/2: 3.42 cm2
S' Lateral: 2.9 cm

## 2023-01-11 NOTE — Progress Notes (Signed)
*  PRELIMINARY RESULTS* Echocardiogram 2D Echocardiogram has been performed.  Stacey Drain 01/11/2023, 12:34 PM

## 2023-01-23 ENCOUNTER — Ambulatory Visit (HOSPITAL_COMMUNITY)
Admission: RE | Admit: 2023-01-23 | Discharge: 2023-01-23 | Disposition: A | Discharge status: Home / Self Care | Payer: No Typology Code available for payment source | Source: Ambulatory Visit | Attending: Urology | Admitting: Urology

## 2023-01-23 ENCOUNTER — Encounter: Payer: Self-pay | Admitting: Urology

## 2023-01-23 ENCOUNTER — Ambulatory Visit: Payer: No Typology Code available for payment source | Admitting: Urology

## 2023-01-23 VITALS — BP 143/85 | HR 72 | Temp 98.2°F

## 2023-01-23 DIAGNOSIS — N529 Male erectile dysfunction, unspecified: Secondary | ICD-10-CM | POA: Diagnosis not present

## 2023-01-23 DIAGNOSIS — N200 Calculus of kidney: Secondary | ICD-10-CM

## 2023-01-23 DIAGNOSIS — N400 Enlarged prostate without lower urinary tract symptoms: Secondary | ICD-10-CM

## 2023-01-23 DIAGNOSIS — R195 Other fecal abnormalities: Secondary | ICD-10-CM | POA: Diagnosis not present

## 2023-01-23 LAB — URINALYSIS, ROUTINE W REFLEX MICROSCOPIC
Bilirubin, UA: NEGATIVE
Glucose, UA: NEGATIVE
Ketones, UA: NEGATIVE
Nitrite, UA: NEGATIVE
Specific Gravity, UA: 1.03 (ref 1.005–1.030)
Urobilinogen, Ur: 1 mg/dL (ref 0.2–1.0)
pH, UA: 5.5 (ref 5.0–7.5)

## 2023-01-23 LAB — MICROSCOPIC EXAMINATION
Bacteria, UA: NONE SEEN
RBC, Urine: >30 /hpf — AB (ref 0–2)

## 2023-01-23 NOTE — Progress Notes (Signed)
History of Present Illness: Mr. Sapp is a 67 y.o. male who presents today for follow up visit at [facility] [place]. - GU History: 1. BPH. - 04/04/2022: Underwent Thulium laser TURP by Dr. Mena Goes. - Denies family history of prostate cancer. 2. Kidney stones. - 07/26/2022: Underwent right ESWL by Dr. Ronne Binning. 3. Erectile dysfunction. - He takes Tadalafil 5 mg daily and 20 mg PRN with good results.   At last visit with Dr. Ronne Binning on 10/06/2022: Doing well.  Since last visit: RUS on 01/04/2023:  1. 6 mm nonobstructive stone in the right kidney. 2. Shadowing calcifications along the posterior aspect of the bladder could represent stones in the bladder versus prostate calcifications.  Today: He reports nearly constant mild bladder pain radiating to distal penis and constant progressively worsening dysuria since his ESWL procedure on 07/26/2022. The bladder pain worsens progressively with bladder filling; feels better just after urinating. 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 Denies stress or dietary triggers. He denies seeing any stones pass recently but states he has been having some intermittent pink-tinged urine and small blood clots. He denies flank pain/ abdominal pain.   Reports urgency and rare urge incontinence (predominately if he has delayed voiding). Reports nocturia 1x/night. He denies urinary frequency, straining to void, or sensations of incomplete emptying.   Fall Screening: Do you usually have a device to assist in your mobility? No    Medications: Current Outpatient Medications  Medication Sig Dispense Refill   tamsulosin (FLOMAX) 0.4 MG CAPS capsule SMARTSIG:1 Capsule(s) By Mouth Every Evening     amLODipine (NORVASC) 5 MG tablet Take 1 tablet (5 mg total) by mouth daily. 90 tablet 3   aspirin EC 81 MG tablet Take 81 mg by mouth daily.     baclofen  (LIORESAL) 10 MG tablet Take 1 tablet (10 mg total) by mouth at bedtime as needed for muscle spasms. For leg cramping (Patient taking differently: Take 10 mg by mouth at bedtime. For leg cramping) 90 tablet 3   Beta Carotene (VITAMIN A) 25000 UNIT capsule Take 25,000 Units by mouth daily.     co-enzyme Q-10 30 MG capsule Take 30 mg by mouth daily.     cyclobenzaprine (FLEXERIL) 5 MG tablet Take 1 tablet (5 mg total) by mouth 3 (three) times daily as needed for muscle spasms. 30 tablet 0   metoprolol tartrate (LOPRESSOR) 25 MG tablet Take 1 tablet (25 mg total) by mouth 2 (two) times daily. 180 tablet 3   omeprazole (PRILOSEC) 40 MG capsule TAKE 1 CAPSULE (40 MG TOTAL) BY MOUTH DAILY. 90 capsule 1   OVER THE COUNTER MEDICATION Take 1 tablet by mouth 2 (two) times daily. Vinegar tablet by mouth twice a day      rosuvastatin (CRESTOR) 10 MG tablet TAKE 1 TABLET BY MOUTH EVERY DAY 90 tablet 0   tadalafil (CIALIS) 20 MG tablet Take 1 tablet (20 mg total) by mouth daily as needed. 10 tablet 5   tadalafil (CIALIS) 5 MG tablet Take 1 tablet (5 mg total) by mouth daily. 30 tablet 11   Turmeric 500 MG CAPS Take 500 mg by mouth 2 (two) times daily.      vitamin B-12 (CYANOCOBALAMIN) 500 MCG tablet Take 1,000 mcg by mouth daily.     vitamin C (ASCORBIC ACID) 500 MG tablet Take 500 mg by mouth 2 (two) times daily.  No current facility-administered medications for this visit.    Allergies: No Known Allergies  Past Medical History:  Diagnosis Date   Arthritis    hands and shoulders (left shoulder is worse than right)   Aspiration pneumonia (HCC) 03/24/2014   after surgery for colostomy   BPH (benign prostatic hyperplasia)    Colostomy in place [facility])    Complication of anesthesia 03/24/2014   aspiration pneumonia after surgery for colostomy, no problem with  surgeries since then   Difficulty urinating    Dyspnea    @ times due to hernias, seeing [facility] for  surgical consultation 04-20-2022, can  lie flat   GERD (gastroesophageal reflux disease)    Hypertension    Ileostomy present (HCC)    Incisional hernia    right abdomen   Parastomal hernia left    Rectal cancer, uT2uN0, s/p LAR/ileostomy 03/20/2014 colostomy Aug2015 12/25/2013   TCS MAY 2015    Restless leg    Right inguinal hernia    Shortening, leg, congenital    Left   Skin irritation    Surrounding ostomy site   Past Surgical History:  Procedure Laterality Date   COLON RESECTION N/A 03/24/2014   Procedure: Diagnostic  laparotomy, takedown of coloanal anastamosis, end colostomy, with wound vac;  Surgeon: Ardeth Sportsman, MD;  Location: [facility];  Service: General;  Laterality: N/A;   COLONOSCOPY N/A 12/22/2013   SLF: 1. Normal mucosa in the terminal ilium. 2. Moderate diverticulosis in the transverse colon, descending colon, and sigmoid colon. 3. Rectal bleeding due ot rectal mass and hemorrhoids 4. Moderate sized internal hemorrhoids.    COLONOSCOPY N/A 01/19/2015   Procedure: COLONOSCOPY;  Surgeon: West Bali, MD;  Location: [facility];  Service: Endoscopy;  Laterality: N/A;  1215   COLONOSCOPY N/A 03/22/2018   Procedure: COLONOSCOPY;  Surgeon: West Bali, MD;  Location: [facility];  Service: Endoscopy;  Laterality: N/A;  2:15pm   COLOSTOMY     03-24-2014   ESOPHAGOGASTRODUODENOSCOPY N/A 12/22/2013   SLF: Probable Candia esophagitis2. Probable Barretts esphagus 3. Mild gastritis.    EUS N/A 01/01/2014   Procedure: LOWER ENDOSCOPIC ULTRASOUND (EUS);  Surgeon: Rachael Fee, MD;  Location: [facility];  Service: Endoscopy;  Laterality: N/A;   EXAMINATION UNDER ANESTHESIA  08/25/2014   Procedure: EXAM UNDER ANESTHESIA;  Surgeon: Karie Soda, MD;  Location: [facility];  Service: General;;   EXTRACORPOREAL SHOCK WAVE LITHOTRIPSY Right 07/25/2022   Procedure: EXTRACORPOREAL SHOCK WAVE LITHOTRIPSY (ESWL);  Surgeon: Malen Gauze, MD;  Location: [facility];  Service: Urology;  Laterality: Right;   eye  reattachment and ear reattachment  Right 08/21/1969   FLEXIBLE SIGMOIDOSCOPY N/A 03/24/2014   Procedure: FLEXIBLE SIGMOIDOSCOPY;  Surgeon: Ardeth Sportsman, MD;  Location: [facility];  Service: General;  Laterality: N/A;   HIP FRACTURE SURGERY Left age 19 on 04-06-1970   ILEOSTOMY     ILEOSTOMY CLOSURE N/A 08/25/2014   Procedure: TAKEDOWN OF LOOP ILEOSTOMY ;  Surgeon: Karie Soda, MD;  Location: [facility];  Service: General;  Laterality: N/A;   INSERTION OF MESH N/A 04/11/2017   Procedure: INSERTION OF MESH ;  Surgeon: Karie Soda, MD;  Location: [facility];  Service: General;  Laterality: N/A;  BILATERAL TAP BLOCK   THULIUM LASER TURP (TRANSURETHRAL RESECTION OF PROSTATE) N/A 04/04/2022   Procedure: THULIUM LASER TURP (TRANSURETHRAL RESECTION OF PROSTATE);  Surgeon: Jerilee Field, MD;  Location: [facility];  Service: Urology;  Laterality: N/A;   TOTAL  HIP ARTHROPLASTY Left 11/19/2015   Procedure: LEFT TOTAL HIP ARTHROPLASTY ANTERIOR APPROACH;  Surgeon: Kathryne Hitch, MD;  Location: [facility];  Service: Orthopedics;  Laterality: Left;   VENTRAL HERNIA REPAIR N/A 04/11/2017   Procedure: LAPAROSCOPIC INCARCERATED PERISTOMAL AND INCARCERATED INCISIONAL HERNIA X2;  Surgeon: Karie Soda, MD;  Location: [facility];  Service: General;  Laterality: N/A;  BILATERAL TAP BLOCK    Family History  Problem Relation Age of Onset   Colon cancer Sister    Emphysema Mother    Heart attack Brother    Colon polyps Neg Hx    Social History   Socioeconomic History   Marital status: Married    Spouse name: Not on file   Number of children: Not on file   Years of education: Not on file   Highest education level: Not on file  Occupational History   Not on file  Tobacco Use   Smoking status: Former    Types: Cigars    Quit date: 2012    Years since quitting: 12.4   Smokeless tobacco: Never  Vaping Use   Vaping Use: Never used  Substance and Sexual Activity   Alcohol use: No    Comment:  quit years ago   Drug use: No   Sexual activity: Not on file  Other Topics Concern   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   Social Determinants of Health   Financial Resource Strain: Low Risk  (02/14/2022)   Overall Financial Resource Strain (CARDIA)    Difficulty of Paying Living Expenses: Not hard at all  Food Insecurity: No Food Insecurity (02/14/2022)   Hunger Vital Sign    Worried About Running Out of Food in the Last Year: Never true    Ran Out of Food in the Last Year: Never true  Transportation Needs: No Transportation Needs (02/14/2022)   PRAPARE - Administrator, Civil Service (Medical): No    Lack of Transportation (Non-Medical): No  Physical Activity: Sufficiently Active (02/14/2022)   Exercise Vital Sign    Days of Exercise per Week: 5 days    Minutes of Exercise per Session: 30 min  Stress: No Stress Concern Present (02/14/2022)   [employer] of Occupational Health - Occupational Stress Questionnaire    Feeling of Stress : Not at all  Social Connections: Socially Integrated (02/14/2022)   Social Connection and Isolation Panel [NHANES]    Frequency of Communication with Friends and Family: More than three times a week    Frequency of Social Gatherings with Friends and Family: More than three times a week    Attends Religious Services: More than 4 times per year    Active Member of [employer] or Organizations: Yes    Attends Engineer, structural: More than 4 times per year    Marital Status: Married  Catering manager Violence: Not At Risk (02/14/2022)   Humiliation, Afraid, Rape, and Kick questionnaire    Fear of Current or Ex-Partner: No    Emotionally Abused: No    Physically Abused: No    Sexually Abused: No    SUBJECTIVE  Review of Systems Constitutional: Patient denies any unintentional weight loss or change in strength lntegumentary: Patient denies any rashes or pruritus Eyes: Patient denies dry  eyes ENT: Patient denies dry mouth Cardiovascular: Patient denies chest pain or syncope Respiratory: Patient denies shortness of breath Gastrointestinal: Patient denies nausea, vomiting, constipation, or diarrhea Musculoskeletal: Patient denies muscle  cramps or weakness Neurologic: Patient denies convulsions or seizures Psychiatric: Patient denies memory problems Allergic/Immunologic: Patient denies recent allergic reaction(s) Hematologic/Lymphatic: Patient denies bleeding tendencies Endocrine: Patient denies heat/cold intolerance  GU: As per HPI.  OBJECTIVE Vitals:   01/23/23 1113  BP: (!) 143/85  Pulse: 72  Temp: 98.2 F (36.8 C)   There is no height or weight on file to calculate BMI.  Physical Examination  Constitutional: No obvious distress; patient is non-toxic appearing  Cardiovascular: No visible lower extremity edema.  Respiratory: The patient does not have audible wheezing/stridor; respirations do not appear labored  Gastrointestinal: Abdomen non-distended Musculoskeletal: Normal ROM of UEs  Skin: No obvious rashes/open sores  Neurologic: CN 2-12 grossly intact Psychiatric: Answered questions appropriately with normal affect  Hematologic/Lymphatic/Immunologic: No obvious bruises or sites of spontaneous bleeding  UA: positive for >30 RBC/hpf and calcium oxalate crystals  ASSESSMENT Kidney stones - Plan: Urinalysis, Routine w reflex microscopic, Abdomen 1 view (KUB)  Erectile dysfunction, unspecified erectile dysfunction type  Benign prostatic hyperplasia, unspecified whether lower urinary tract symptoms present  We reviewed recent imaging results; advised KUB for further evaluation of suspected bladder stone. Will plan to consult with Dr. Ronne Binning and notify patient of our recommendations following review of imaging results.   We discussed potential treatment options including observation, medical expulsive therapy, extracorporeal shock wave lithotripsy (ESWL),  ureteroscopic stone manipulation, cystolitholapaxy. We discussed possible risks and benefits of each option including but not limited to: including pain, infection, sepsis, UTI, ureter perforation, need for stenting, post-op ureteral stricture, hematuria.  Pt verbalized understanding and agreement. All questions were answered.   PLAN Advised the following: KUB.  F/u to be determined based on findings.   Orders Placed This Encounter  Procedures   Abdomen 1 view (KUB)    Standing Status:   Future    Number of Occurrences:   1    Standing Expiration Date:   01/23/2024    Order Specific Question:   Reason for Exam (SYMPTOM  OR DIAGNOSIS REQUIRED)    Answer:   kidney stones    Order Specific Question:   Preferred imaging location?    Answer:   [facility]   Urinalysis, Routine w reflex microscopic    It has been explained that the patient is to follow regularly with their PCP in addition to all other providers involved in their care and to follow instructions provided by these respective offices. Patient advised to contact urology clinic if any urologic-pertaining questions, concerns, new symptoms or problems arise in the interim period.  There are no Patient Instructions on file for this visit.  Electronically signed by: @MEMO @ 01/23/2023 1:10 PM

## 2023-01-30 ENCOUNTER — Telehealth: Payer: Self-pay

## 2023-01-30 NOTE — Telephone Encounter (Signed)
Left message for a return call need clarification for medical supply order form.

## 2023-01-30 NOTE — Progress Notes (Signed)
Addendum:  Consulted with Dr. Ronne Binning after reviewing KUB result. We agreed next step to evaluate persistent hematuria is office cystoscopy. I spoke with patient via phone today; he verbalized understanding and agreement. Schedulers notified to set up appointment.   Evette Georges, MSN, FNP-C, CUNP

## 2023-02-05 ENCOUNTER — Other Ambulatory Visit: Payer: Self-pay

## 2023-02-05 DIAGNOSIS — Z933 Colostomy status: Secondary | ICD-10-CM

## 2023-02-05 DIAGNOSIS — C20 Malignant neoplasm of rectum: Secondary | ICD-10-CM

## 2023-02-08 DIAGNOSIS — Z933 Colostomy status: Secondary | ICD-10-CM | POA: Diagnosis not present

## 2023-03-05 ENCOUNTER — Encounter: Payer: Self-pay | Admitting: *Deleted

## 2023-03-12 ENCOUNTER — Ambulatory Visit: Payer: No Typology Code available for payment source | Admitting: Urology

## 2023-03-12 VITALS — BP 144/89 | HR 73

## 2023-03-12 DIAGNOSIS — R319 Hematuria, unspecified: Secondary | ICD-10-CM | POA: Diagnosis not present

## 2023-03-12 DIAGNOSIS — N400 Enlarged prostate without lower urinary tract symptoms: Secondary | ICD-10-CM

## 2023-03-12 DIAGNOSIS — N200 Calculus of kidney: Secondary | ICD-10-CM | POA: Diagnosis not present

## 2023-03-12 LAB — URINALYSIS, ROUTINE W REFLEX MICROSCOPIC
Bilirubin, UA: NEGATIVE
Glucose, UA: NEGATIVE
Ketones, UA: NEGATIVE
Leukocytes,UA: NEGATIVE
Nitrite, UA: NEGATIVE
Specific Gravity, UA: 1.03 (ref 1.005–1.030)
Urobilinogen, Ur: 0.2 mg/dL (ref 0.2–1.0)
pH, UA: 5.5 (ref 5.0–7.5)

## 2023-03-12 LAB — MICROSCOPIC EXAMINATION
Bacteria, UA: NONE SEEN
RBC, Urine: >30 /hpf — AB (ref 0–2)
WBC, UA: NONE SEEN /hpf (ref 0–5)

## 2023-03-12 MED ORDER — CIPROFLOXACIN HCL 500 MG PO TABS
500.0000 mg | ORAL_TABLET | Freq: Once | ORAL | Status: AC
Start: 2023-03-12 — End: 2023-03-12
  Administered 2023-03-12: 500 mg via ORAL

## 2023-03-12 NOTE — Progress Notes (Signed)
   03/12/23  CC: hemturia   HPI:  Blood pressure (!) 144/89, pulse 73. NED. A&Ox3.   No respiratory distress   Abd soft, NT, ND Normal phallus with bilateral descended testicles  Cystoscopy Procedure Note  Patient identification was confirmed, informed consent was obtained, and patient was prepped using Betadine solution.  Lidocaine jelly was administered per urethral meatus.     Pre-Procedure: - Inspection reveals a normal caliber ureteral meatus.  Procedure: The flexible cystoscope was introduced without difficulty - No urethral strictures/lesions are present. - Enlarged prostate  - Normal bladder neck - Bilateral ureteral orifices identified - Bladder mucosa  reveals no ulcers, tumors, or lesions - No bladder stones - No trabeculation   Post-Procedure: - Patient tolerated the procedure well  Assessment/ Plan: We discussed the management of his BPH including continued medical therapy, Rezum, Urolift, TURP and simple prostatectomy. After discussing the options the patient has elected to proceed with TURP. Risks/benefits/alternatives discussed.   No follow-ups on file.  Wilkie Aye, MD

## 2023-03-13 ENCOUNTER — Telehealth: Payer: Self-pay | Admitting: Internal Medicine

## 2023-03-13 NOTE — Telephone Encounter (Signed)
Pt dropped off questionnaire, in review.

## 2023-03-14 ENCOUNTER — Telehealth: Payer: Self-pay | Admitting: *Deleted

## 2023-03-14 NOTE — Telephone Encounter (Signed)
Procedure: Colonoscopy  Height: 5'5 Weight: 185lbs        Have you had a colonoscopy before?  03/22/18 Dr. Darrick Penna  Do you have family history of colon cancer?  sister  Do you have a family history of polyps? no  Previous colonoscopy with polyps removed? yes  Do you have a history colorectal cancer?   yes  Are you diabetic?  no  Do you have a prosthetic or mechanical heart valve? no  Do you have a pacemaker/defibrillator?   no  Have you had endocarditis/atrial fibrillation?  no  Do you use supplemental oxygen/CPAP?  no  Have you had joint replacement within the last 12 months?  no  Do you tend to be constipated or have to use laxatives?  no   Do you have history of alcohol use? If yes, how much and how often.  no  Do you have history or are you using drugs? If yes, what do are you  using?  no  Have you ever had a stroke/heart attack?  no  Have you ever had a heart or other vascular stent placed,?no  Do you take weight loss medication? no  Do you take any blood-thinning medications such as: (Plavix, aspirin, Coumadin, Aggrenox, Brilinta, Xarelto, Eliquis, Pradaxa, Savaysa or Effient)? no  If yes we need the name, milligram, dosage and who is prescribing doctor:               Current Outpatient Medications  Medication Sig Dispense Refill   amLODipine (NORVASC) 5 MG tablet Take 1 tablet (5 mg total) by mouth daily. 90 tablet 3   aspirin EC 81 MG tablet Take 81 mg by mouth daily.     baclofen (LIORESAL) 10 MG tablet Take 1 tablet (10 mg total) by mouth at bedtime as needed for muscle spasms. For leg cramping (Patient taking differently: Take 10 mg by mouth at bedtime. For leg cramping) 90 tablet 3   Beta Carotene (VITAMIN A) 25000 UNIT capsule Take 25,000 Units by mouth daily.     co-enzyme Q-10 30 MG capsule Take 30 mg by mouth daily.     cyclobenzaprine (FLEXERIL) 5 MG tablet Take 1 tablet (5 mg total) by mouth 3 (three) times daily as needed for muscle spasms. 30 tablet  0   metoprolol tartrate (LOPRESSOR) 25 MG tablet Take 1 tablet (25 mg total) by mouth 2 (two) times daily. 180 tablet 3   omeprazole (PRILOSEC) 40 MG capsule TAKE 1 CAPSULE (40 MG TOTAL) BY MOUTH DAILY. 90 capsule 1   OVER THE COUNTER MEDICATION Take 1 tablet by mouth 2 (two) times daily. Vinegar tablet by mouth twice a day      rosuvastatin (CRESTOR) 10 MG tablet TAKE 1 TABLET BY MOUTH EVERY DAY 90 tablet 0   tadalafil (CIALIS) 20 MG tablet Take 1 tablet (20 mg total) by mouth daily as needed. 10 tablet 5   tadalafil (CIALIS) 5 MG tablet Take 1 tablet (5 mg total) by mouth daily. 30 tablet 11   tamsulosin (FLOMAX) 0.4 MG CAPS capsule SMARTSIG:1 Capsule(s) By Mouth Every Evening     Turmeric 500 MG CAPS Take 500 mg by mouth 2 (two) times daily.      vitamin B-12 (CYANOCOBALAMIN) 500 MCG tablet Take 1,000 mcg by mouth daily.     vitamin C (ASCORBIC ACID) 500 MG tablet Take 500 mg by mouth 2 (two) times daily.     No current facility-administered medications for this visit.    No Known  Allergies

## 2023-03-16 ENCOUNTER — Telehealth: Payer: Self-pay

## 2023-03-16 ENCOUNTER — Other Ambulatory Visit: Payer: Self-pay

## 2023-03-16 ENCOUNTER — Other Ambulatory Visit: Payer: Self-pay | Admitting: Family Medicine

## 2023-03-16 MED ORDER — ROSUVASTATIN CALCIUM 10 MG PO TABS: 10.0000 mg | ORAL_TABLET | Freq: Every day | ORAL | 0 refills | Status: DC

## 2023-03-16 NOTE — Telephone Encounter (Signed)
Prescription Request  03/16/2023  LOV: Visit date not found  What is the name of the medication or equipment? rosuvastatin (CRESTOR) 10 MG tablet   Have you contacted your pharmacy to request a refill? Yes   Which pharmacy would you like this sent to? [facility]   Patient notified that their request is being sent to the clinical staff for review and that they should receive a response within 2 business days.   Please advise at Mobile [phone] (mobile)

## 2023-03-20 ENCOUNTER — Encounter: Payer: Self-pay | Admitting: Urology

## 2023-03-20 NOTE — Patient Instructions (Signed)

## 2023-03-30 DIAGNOSIS — Z933 Colostomy status: Secondary | ICD-10-CM | POA: Diagnosis not present

## 2023-04-10 ENCOUNTER — Ambulatory Visit (HOSPITAL_COMMUNITY)
Admission: RE | Admit: 2023-04-10 | Discharge: 2023-04-10 | Disposition: A | Discharge status: Home / Self Care | Payer: No Typology Code available for payment source | Source: Ambulatory Visit | Attending: Family Medicine | Admitting: Family Medicine

## 2023-04-10 ENCOUNTER — Ambulatory Visit (INDEPENDENT_AMBULATORY_CARE_PROVIDER_SITE_OTHER): Payer: No Typology Code available for payment source | Admitting: Family Medicine

## 2023-04-10 VITALS — BP 144/76 | HR 62 | Wt 196.8 lb

## 2023-04-10 DIAGNOSIS — Z933 Colostomy status: Secondary | ICD-10-CM

## 2023-04-10 DIAGNOSIS — M79674 Pain in right toe(s): Secondary | ICD-10-CM | POA: Insufficient documentation

## 2023-04-10 DIAGNOSIS — R0609 Other forms of dyspnea: Secondary | ICD-10-CM | POA: Diagnosis not present

## 2023-04-10 DIAGNOSIS — Z1211 Encounter for screening for malignant neoplasm of colon: Secondary | ICD-10-CM | POA: Diagnosis not present

## 2023-04-10 DIAGNOSIS — Z130 Encounter for screening for diseases of the blood and blood-forming organs and certain disorders involving the immune mechanism: Secondary | ICD-10-CM

## 2023-04-10 DIAGNOSIS — K435 Parastomal hernia without obstruction or  gangrene: Secondary | ICD-10-CM

## 2023-04-10 DIAGNOSIS — I10 Essential (primary) hypertension: Secondary | ICD-10-CM

## 2023-04-10 DIAGNOSIS — M19071 Primary osteoarthritis, right ankle and foot: Secondary | ICD-10-CM | POA: Diagnosis not present

## 2023-04-10 DIAGNOSIS — M2011 Hallux valgus (acquired), right foot: Secondary | ICD-10-CM | POA: Diagnosis not present

## 2023-04-10 DIAGNOSIS — R7303 Prediabetes: Secondary | ICD-10-CM

## 2023-04-10 DIAGNOSIS — E785 Hyperlipidemia, unspecified: Secondary | ICD-10-CM | POA: Diagnosis not present

## 2023-04-10 DIAGNOSIS — N400 Enlarged prostate without lower urinary tract symptoms: Secondary | ICD-10-CM | POA: Diagnosis not present

## 2023-04-10 DIAGNOSIS — S99921A Unspecified injury of right foot, initial encounter: Secondary | ICD-10-CM | POA: Diagnosis not present

## 2023-04-10 MED ORDER — AMLODIPINE BESYLATE 5 MG PO TABS: 5.0000 mg | ORAL_TABLET | Freq: Every day | ORAL | 3 refills | Status: DC

## 2023-04-10 MED ORDER — METOPROLOL TARTRATE 25 MG PO TABS: 25.0000 mg | ORAL_TABLET | Freq: Two times a day (BID) | ORAL | 3 refills | Status: DC

## 2023-04-10 NOTE — Assessment & Plan Note (Signed)
X-ray of the toe for further evaluation today.

## 2023-04-10 NOTE — Progress Notes (Signed)
Subjective:  Patient ID: [name], male    DOB: 1955/10/31  Age: 67 y.o. MRN: [id number]  CC:  Follow up  HPI:  67 year old male with the below mentioned medical problems presents for follow up.  Patient has several concerns today.  Patient has not heard back from urology regarding scheduling his TURP.  Patient also reports that he never heard back from [facility] regarding hernia repair.  Patient is due for colonoscopy.  Needs referral.  Patient states that he is having issues with his ostomy.  He states that he has small raised bumps which are irritated.  He would like to have access to an ostomy nurse.  Additionally, patient injured his right second toe quite sometime ago.  He believes that he may have fractured it.  He states that when he lies his foot down flat, the toe does not go all the way down.  He would like me to examine this.  Patient seen by cardiology.  Had negative stress test.  BP mildly elevated here today.  He is compliant with amlodipine and metoprolol.  Patient needs labs.  Patient Active Problem List   Diagnosis Date Noted   Toe pain, right 04/10/2023   Kidney stones 01/23/2023   Erectile dysfunction 01/23/2023   Benign prostatic hyperplasia 01/23/2023   DOE (dyspnea on exertion) 10/10/2022   Incisional hernia 02/16/2022   Parastomal hernia 02/16/2022   Skin cyst 02/16/2022   Hyperlipidemia 09/14/2021   Incarcerated incisional hernia s/p lap repair with mesh 04/11/2017 04/11/2017   Colostomy in place [facility])    Essential hypertension, benign 05/12/2014   Obesity (BMI 30-39.9) 03/24/2014   Rectal cancer, uT2uN0, s/p LAR/ileostomy 03/20/2014 colostomy Aug2015 12/25/2013   GERD (gastroesophageal reflux disease) 11/27/2013    Social Hx   Social History   Socioeconomic History   Marital status: Married    Spouse name: Not on file   Number of children: Not on file   Years of education: Not on file   Highest education level:  Not on file  Occupational History   Not on file  Tobacco Use   Smoking status: Former    Types: Cigars    Quit date: 2012    Years since quitting: 12.6   Smokeless tobacco: Never  Vaping Use   Vaping status: Never Used  Substance and Sexual Activity   Alcohol use: No    Comment: quit years ago   Drug use: No   Sexual activity: Not on file  Other Topics Concern   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   Social Determinants of Health   Financial Resource Strain: Low Risk  (02/14/2022)   Overall Financial Resource Strain (CARDIA)    Difficulty of Paying Living Expenses: Not hard at all  Food Insecurity: No Food Insecurity (02/14/2022)   Hunger Vital Sign    Worried About Running Out of Food in the Last Year: Never true    Ran Out of Food in the Last Year: Never true  Transportation Needs: No Transportation Needs (02/14/2022)   PRAPARE - Administrator, Civil Service (Medical): No    Lack of Transportation (Non-Medical): No  Physical Activity: Sufficiently Active (02/14/2022)   Exercise Vital Sign    Days of Exercise per Week: 5 days    Minutes of Exercise per Session: 30 min  Stress: No Stress Concern Present (02/14/2022)   [employer] of Occupational Health -  Occupational Stress Questionnaire    Feeling of Stress : Not at all  Social Connections: Socially Integrated (02/14/2022)   Social Connection and Isolation Panel [NHANES]    Frequency of Communication with Friends and Family: More than three times a week    Frequency of Social Gatherings with Friends and Family: More than three times a week    Attends Religious Services: More than 4 times per year    Active Member of [employer] or Organizations: Yes    Attends Engineer, structural: More than 4 times per year    Marital Status: Married    Review of Systems Per HPI  Objective:  BP (!) 144/76   Pulse 62   Wt 196 lb 12.8 oz (89.3 kg)   SpO2 96%   BMI 32.75  kg/m      04/10/2023    9:01 AM 04/10/2023    8:26 AM 03/12/2023    2:38 PM  BP/Weight  Systolic BP 144 147 144  Diastolic BP 76 90 89  Wt. (Lbs)  196.8   BMI  32.75 kg/m2     Physical Exam Vitals and nursing note reviewed.  Constitutional:      General: He is not in acute distress. HENT:     Head: Normocephalic and atraumatic.  Cardiovascular:     Rate and Rhythm: Normal rate and regular rhythm.  Pulmonary:     Effort: Pulmonary effort is normal.     Breath sounds: Normal breath sounds.  Abdominal:     Palpations: Abdomen is soft.     Tenderness: There is no abdominal tenderness.     Comments: Parastomal hernia noted.  Neurological:     Mental Status: He is alert.  Psychiatric:        Mood and Affect: Mood normal.        Behavior: Behavior normal.     Lab Results  Component Value Date   WBC 10.0 07/22/2022   HGB 13.0 07/22/2022   HCT 40.0 07/22/2022   PLT 302 07/22/2022   GLUCOSE 131 (H) 07/22/2022   CHOL 151 03/14/2022   TRIG 158 (H) 03/14/2022   HDL 38 (L) 03/14/2022   LDLCALC 85 03/14/2022   ALT 28 07/22/2022   AST 21 07/22/2022   NA 137 07/22/2022   K 3.6 07/22/2022   CL 105 07/22/2022   CREATININE 0.91 07/22/2022   BUN 24 (H) 07/22/2022   CO2 25 07/22/2022   TSH 2.029 10/18/2018   INR 0.99 12/25/2013   HGBA1C 5.7 (H) 03/14/2022     Assessment & Plan:   Problem List Items Addressed This Visit       Cardiovascular and Mediastinum   Essential hypertension, benign - Primary (Chronic)    BP mildly elevated here today.  Continue current medications.  Continue to monitor closely.       Relevant Medications   amLODipine (NORVASC) 5 MG tablet   metoprolol tartrate (LOPRESSOR) 25 MG tablet   Other Relevant Orders   CMP14+EGFR     Genitourinary   Benign prostatic hyperplasia    Patient with ongoing issues with BPH and hematuria.  I reached out to patient's urologist.  Staff will call and schedule TURP.        Other   Colostomy in place  [facility]) (Chronic)    Referral placed for ostomy clinic.      Relevant Orders   Amb Referral to Ostomy Clinic   Hyperlipidemia   Relevant Medications   amLODipine (NORVASC) 5 MG tablet  metoprolol tartrate (LOPRESSOR) 25 MG tablet   Other Relevant Orders   Lipid panel   Toe pain, right    X-ray of the toe for further evaluation today.      Relevant Orders   DG Toe 2nd Right   Parastomal hernia    I reached out to general surgery at [facility].  They will call the patient to schedule an appointment.      Relevant Orders   Amb Referral to [facility]   DOE (dyspnea on exertion)    Recent negative stress test by cardiology.      Other Visit Diagnoses     Prediabetes       Relevant Orders   Hemoglobin A1c   Screening for deficiency anemia       Relevant Orders   CBC   Encounter for screening colonoscopy       Relevant Orders   Ambulatory referral to Gastroenterology       Meds ordered this encounter  Medications   amLODipine (NORVASC) 5 MG tablet    Sig: Take 1 tablet (5 mg total) by mouth daily.    Dispense:  90 tablet    Refill:  3   metoprolol tartrate (LOPRESSOR) 25 MG tablet    Sig: Take 1 tablet (25 mg total) by mouth 2 (two) times daily.    Dispense:  180 tablet    Refill:  3    Follow-up:  6 months  Trenda Corliss Adriana Simas DO [facility]

## 2023-04-10 NOTE — Assessment & Plan Note (Signed)
Referral placed for ostomy clinic.

## 2023-04-10 NOTE — Patient Instructions (Addendum)
Labs and xray ordered.  I reached out to Urology.  I will reach out to General surgery at [facility].  Referral for colonoscopy placed.   Follow up in 6 months.

## 2023-04-10 NOTE — Assessment & Plan Note (Signed)
Patient with ongoing issues with BPH and hematuria.  I reached out to patient's urologist.  Staff will call and schedule TURP.

## 2023-04-10 NOTE — Assessment & Plan Note (Signed)
BP mildly elevated here today.  Continue current medications.  Continue to monitor closely.

## 2023-04-10 NOTE — Assessment & Plan Note (Signed)
I reached out to general surgery at [facility].  They will call the patient to schedule an appointment.

## 2023-04-10 NOTE — Assessment & Plan Note (Signed)
Recent negative stress test by cardiology.

## 2023-04-11 LAB — CBC
Hematocrit: 42.7 % (ref 37.5–51.0)
Hemoglobin: 14.1 g/dL (ref 13.0–17.7)
MCH: 29.6 pg (ref 26.6–33.0)
MCHC: 33 g/dL (ref 31.5–35.7)
MCV: 90 fL (ref 79–97)
Platelets: 305 10*3/uL (ref 150–450)
RBC: 4.77 x10E6/uL (ref 4.14–5.80)
RDW: 13.3 % (ref 11.6–15.4)
WBC: 6.1 10*3/uL (ref 3.4–10.8)

## 2023-04-11 LAB — CMP14+EGFR
ALT: 18 IU/L (ref 0–44)
AST: 15 IU/L (ref 0–40)
Albumin: 4.3 g/dL (ref 3.9–4.9)
Alkaline Phosphatase: 111 IU/L (ref 44–121)
BUN/Creatinine Ratio: 27 — ABNORMAL HIGH (ref 10–24)
BUN: 22 mg/dL (ref 8–27)
Bilirubin Total: 0.3 mg/dL (ref 0.0–1.2)
CO2: 24 mmol/L (ref 20–29)
Calcium: 9.4 mg/dL (ref 8.6–10.2)
Chloride: 103 mmol/L (ref 96–106)
Creatinine, Ser: 0.81 mg/dL (ref 0.76–1.27)
Globulin, Total: 2.1 g/dL (ref 1.5–4.5)
Glucose: 98 mg/dL (ref 70–99)
Potassium: 4.8 mmol/L (ref 3.5–5.2)
Sodium: 141 mmol/L (ref 134–144)
Total Protein: 6.4 g/dL (ref 6.0–8.5)
eGFR: 97 mL/min/{1.73_m2} (ref >59)

## 2023-04-11 LAB — HEMOGLOBIN A1C
Est. average glucose Bld gHb Est-mCnc: 117 mg/dL
Hgb A1c MFr Bld: 5.7 % — ABNORMAL HIGH (ref 4.8–5.6)

## 2023-04-11 LAB — LIPID PANEL
Chol/HDL Ratio: 3.5 ratio (ref 0.0–5.0)
Cholesterol, Total: 140 mg/dL (ref 100–199)
HDL: 40 mg/dL (ref >39)
LDL Chol Calc (NIH): 79 mg/dL (ref 0–99)
Triglycerides: 114 mg/dL (ref 0–149)
VLDL Cholesterol Cal: 21 mg/dL (ref 5–40)

## 2023-05-02 NOTE — Telephone Encounter (Signed)
Patient being seen by surgery at [facility] with possible parastomal hernia repair in the near future.  Please reach out to patient and see if he would like to have colonoscopy done prior or after surgery.  Okay to schedule ASA 3.  Thank you

## 2023-05-03 NOTE — Telephone Encounter (Signed)
Will call once we receive future schedule

## 2023-05-09 ENCOUNTER — Encounter: Payer: Self-pay | Admitting: *Deleted

## 2023-05-09 ENCOUNTER — Other Ambulatory Visit: Payer: Self-pay | Admitting: *Deleted

## 2023-05-09 DIAGNOSIS — Z933 Colostomy status: Secondary | ICD-10-CM | POA: Diagnosis not present

## 2023-05-09 DIAGNOSIS — K435 Parastomal hernia without obstruction or  gangrene: Secondary | ICD-10-CM | POA: Diagnosis not present

## 2023-05-09 MED ORDER — PEG 3350-KCL-NA BICARB-NACL 420 G PO SOLR: 4000.0000 mL | Freq: Once | ORAL | 0 refills | Status: AC

## 2023-05-09 NOTE — Telephone Encounter (Signed)
Pt has been scheduled for 05/28/23 with Dr.Carver. instructions mailed and prep sent to the pharmacy

## 2023-05-09 NOTE — Telephone Encounter (Signed)
Questionnaire from recall, no referral needed  

## 2023-05-18 ENCOUNTER — Telehealth: Payer: Self-pay

## 2023-05-18 NOTE — Telephone Encounter (Signed)
Patient calling to check on surgery to be scheduled. Wants to know how soon it can be scheduled. Still passing blood and burning.  Please advise.   Call:  [phone]

## 2023-05-18 NOTE — Telephone Encounter (Signed)
Patient has complaints of burning with urination and dysuria, pt scheduled for NV ua/uc to rule out infection.

## 2023-05-18 NOTE — Telephone Encounter (Signed)
07/31/2023 scheduled for hernia repair- I can offer patient a surgery date in November do you think that will allow patient enough recovery before proceeding with hernia repair in December.  Please add patient to surgery work que.

## 2023-05-22 ENCOUNTER — Other Ambulatory Visit: Payer: Self-pay | Admitting: Urology

## 2023-05-22 ENCOUNTER — Ambulatory Visit (INDEPENDENT_AMBULATORY_CARE_PROVIDER_SITE_OTHER): Payer: No Typology Code available for payment source

## 2023-05-22 DIAGNOSIS — R399 Unspecified symptoms and signs involving the genitourinary system: Secondary | ICD-10-CM

## 2023-05-22 DIAGNOSIS — R300 Dysuria: Secondary | ICD-10-CM | POA: Diagnosis not present

## 2023-05-22 DIAGNOSIS — N401 Enlarged prostate with lower urinary tract symptoms: Secondary | ICD-10-CM

## 2023-05-22 LAB — URINALYSIS, ROUTINE W REFLEX MICROSCOPIC
Bilirubin, UA: NEGATIVE
Glucose, UA: NEGATIVE
Ketones, UA: NEGATIVE
Nitrite, UA: NEGATIVE
Specific Gravity, UA: 1.03 (ref 1.005–1.030)
Urobilinogen, Ur: 0.2 mg/dL (ref 0.2–1.0)
pH, UA: 6 (ref 5.0–7.5)

## 2023-05-22 LAB — MICROSCOPIC EXAMINATION: RBC, Urine: >30 /[HPF] — AB (ref 0–2)

## 2023-05-22 MED ORDER — NITROFURANTOIN MONOHYD MACRO 100 MG PO CAPS
100.0000 mg | ORAL_CAPSULE | Freq: Two times a day (BID) | ORAL | 0 refills | Status: AC
Start: 2023-05-22 — End: 2023-05-29

## 2023-05-22 NOTE — Progress Notes (Signed)
Patient presents today with complaints of dysuria and burning with urination.  UA and Culture done today. Robyne Askew, FNP reviewed results and verbal that she will send in abx and will call with culture results .  Patient aware of providers recommendations and that we will reach out with culture results.      Guss Bunde, CMA

## 2023-05-23 NOTE — Patient Instructions (Signed)
   Your procedure is scheduled on: 05/28/2023  Report to [facility] Main Entrance at    11:00 AM.  Call this number if you have problems the morning of surgery: [phone]   Remember:              Follow Directions on the letter you received from Your Physician's office regarding the Bowel Prep              No Smoking the day of Procedure :   Take these medicines the morning of surgery with A SIP OF WATER: Amlodipine, metoprolol, and omeprazole   Do not wear jewelry, make-up or nail polish.    Do not bring valuables to the hospital.  Contacts, dentures or bridgework may not be worn into surgery.  .   Patients discharged the day of surgery will not be allowed to drive home.     Colonoscopy, Adult, Care After This sheet gives you information about how to care for yourself after your procedure. Your health care provider may also give you more specific instructions. If you have problems or questions, contact your health care provider. What can I expect after the procedure? After the procedure, it is common to have: A small amount of blood in your stool for 24 hours after the procedure. Some gas. Mild abdominal cramping or bloating.  Follow these instructions at home: General instructions  For the first 24 hours after the procedure: Do not drive or use machinery. Do not sign important documents. Do not drink alcohol. Do your regular daily activities at a slower pace than normal. Eat soft, easy-to-digest foods. Rest often. Take over-the-counter or prescription medicines only as told by your health care provider. It is up to you to get the results of your procedure. Ask your health care provider, or the department performing the procedure, when your results will be ready. Relieving cramping and bloating Try walking around when you have cramps or feel bloated. Apply heat to your abdomen as told by your health care provider. Use a heat source that your health care provider  recommends, such as a moist heat pack or a heating pad. Place a towel between your skin and the heat source. Leave the heat on for 20-30 minutes. Remove the heat if your skin turns bright red. This is especially important if you are unable to feel pain, heat, or cold. You may have a greater risk of getting burned. Eating and drinking Drink enough fluid to keep your urine clear or pale yellow. Resume your normal diet as instructed by your health care provider. Avoid heavy or fried foods that are hard to digest. Avoid drinking alcohol for as long as instructed by your health care provider. Contact a health care provider if: You have blood in your stool 2-3 days after the procedure. Get help right away if: You have more than a small spotting of blood in your stool. You pass large blood clots in your stool. Your abdomen is swollen. You have nausea or vomiting. You have a fever. You have increasing abdominal pain that is not relieved with medicine. This information is not intended to replace advice given to you by your health care provider. Make sure you discuss any questions you have with your health care provider. Document Released: 03/21/2004 Document Revised: 05/01/2016 Document Reviewed: 10/19/2015 Elsevier Interactive Patient Education  [employer].

## 2023-05-24 ENCOUNTER — Encounter (HOSPITAL_COMMUNITY): Payer: Self-pay

## 2023-05-24 ENCOUNTER — Other Ambulatory Visit: Payer: Self-pay

## 2023-05-24 ENCOUNTER — Telehealth: Payer: Self-pay

## 2023-05-24 ENCOUNTER — Encounter (HOSPITAL_COMMUNITY)
Admission: RE | Admit: 2023-05-24 | Discharge: 2023-05-24 | Disposition: A | Discharge status: Home / Self Care | Payer: No Typology Code available for payment source | Source: Ambulatory Visit | Attending: Internal Medicine | Admitting: Internal Medicine

## 2023-05-24 VITALS — BP 132/78 | HR 74 | Temp 97.9°F | Resp 18 | Ht 65.0 in | Wt 196.9 lb

## 2023-05-24 DIAGNOSIS — Z01810 Encounter for preprocedural cardiovascular examination: Secondary | ICD-10-CM | POA: Insufficient documentation

## 2023-05-24 DIAGNOSIS — I10 Essential (primary) hypertension: Secondary | ICD-10-CM

## 2023-05-24 HISTORY — DX: Personal history of urinary calculi: Z87.442

## 2023-05-24 LAB — URINE CULTURE

## 2023-05-24 NOTE — Telephone Encounter (Signed)
-----   Message from Donnita Falls sent at 05/24/2023  8:27 AM EDT ----- Please notify patient: Negative urine culture, no antibiotic needed at this time. Thanks.

## 2023-05-24 NOTE — Telephone Encounter (Signed)
Patient is aware of Sarah's response to culture and voiced understanding.

## 2023-05-28 ENCOUNTER — Ambulatory Visit (HOSPITAL_COMMUNITY): Payer: No Typology Code available for payment source | Admitting: Certified Registered"

## 2023-05-28 ENCOUNTER — Ambulatory Visit (HOSPITAL_COMMUNITY)
Admission: RE | Admit: 2023-05-28 | Discharge: 2023-05-28 | Disposition: A | Discharge status: Home / Self Care | Payer: No Typology Code available for payment source | Attending: Internal Medicine | Admitting: Internal Medicine

## 2023-05-28 ENCOUNTER — Encounter (HOSPITAL_COMMUNITY)
Admission: RE | Disposition: A | Discharge status: Home / Self Care | Payer: Self-pay | Source: Home / Self Care | Attending: Internal Medicine

## 2023-05-28 ENCOUNTER — Encounter (HOSPITAL_COMMUNITY): Payer: Self-pay

## 2023-05-28 DIAGNOSIS — Z85048 Personal history of other malignant neoplasm of rectum, rectosigmoid junction, and anus: Secondary | ICD-10-CM | POA: Diagnosis not present

## 2023-05-28 DIAGNOSIS — Z933 Colostomy status: Secondary | ICD-10-CM | POA: Diagnosis not present

## 2023-05-28 DIAGNOSIS — K635 Polyp of colon: Secondary | ICD-10-CM

## 2023-05-28 DIAGNOSIS — Z1211 Encounter for screening for malignant neoplasm of colon: Secondary | ICD-10-CM | POA: Diagnosis not present

## 2023-05-28 DIAGNOSIS — K5730 Diverticulosis of large intestine without perforation or abscess without bleeding: Secondary | ICD-10-CM | POA: Diagnosis not present

## 2023-05-28 DIAGNOSIS — I10 Essential (primary) hypertension: Secondary | ICD-10-CM | POA: Diagnosis not present

## 2023-05-28 DIAGNOSIS — Z87891 Personal history of nicotine dependence: Secondary | ICD-10-CM | POA: Diagnosis not present

## 2023-05-28 DIAGNOSIS — D123 Benign neoplasm of transverse colon: Secondary | ICD-10-CM | POA: Insufficient documentation

## 2023-05-28 DIAGNOSIS — I129 Hypertensive chronic kidney disease with stage 1 through stage 4 chronic kidney disease, or unspecified chronic kidney disease: Secondary | ICD-10-CM | POA: Diagnosis not present

## 2023-05-28 DIAGNOSIS — N189 Chronic kidney disease, unspecified: Secondary | ICD-10-CM | POA: Diagnosis not present

## 2023-05-28 DIAGNOSIS — Z08 Encounter for follow-up examination after completed treatment for malignant neoplasm: Secondary | ICD-10-CM | POA: Insufficient documentation

## 2023-05-28 DIAGNOSIS — K219 Gastro-esophageal reflux disease without esophagitis: Secondary | ICD-10-CM | POA: Diagnosis not present

## 2023-05-28 DIAGNOSIS — Z932 Ileostomy status: Secondary | ICD-10-CM | POA: Insufficient documentation

## 2023-05-28 DIAGNOSIS — R0602 Shortness of breath: Secondary | ICD-10-CM | POA: Diagnosis not present

## 2023-05-28 DIAGNOSIS — Z85038 Personal history of other malignant neoplasm of large intestine: Secondary | ICD-10-CM | POA: Diagnosis not present

## 2023-05-28 HISTORY — PX: POLYPECTOMY: SHX5525

## 2023-05-28 HISTORY — PX: COLONOSCOPY WITH PROPOFOL: SHX5780

## 2023-05-28 SURGERY — COLONOSCOPY WITH PROPOFOL
Anesthesia: General

## 2023-05-28 MED ORDER — LIDOCAINE HCL (CARDIAC) PF 100 MG/5ML IV SOSY
PREFILLED_SYRINGE | INTRAVENOUS | Status: DC | PRN
  Administered 2023-05-28: 40 mg via INTRAVENOUS

## 2023-05-28 MED ORDER — LACTATED RINGERS IV SOLN: INTRAVENOUS | Status: DC | PRN

## 2023-05-28 MED ORDER — PROPOFOL 500 MG/50ML IV EMUL
INTRAVENOUS | Status: AC
  Filled 2023-05-28: qty 50

## 2023-05-28 MED ORDER — PROPOFOL 500 MG/50ML IV EMUL
INTRAVENOUS | Status: DC | PRN
  Administered 2023-05-28: 150 ug/kg/min via INTRAVENOUS

## 2023-05-28 MED ORDER — PROPOFOL 10 MG/ML IV BOLUS
INTRAVENOUS | Status: DC | PRN
  Administered 2023-05-28: 100 mg via INTRAVENOUS

## 2023-05-28 NOTE — Transfer of Care (Addendum)
Immediate Anesthesia Transfer of Care Note  Patient: [name]  Procedure(s) Performed: COLONOSCOPY WITH PROPOFOL POLYPECTOMY  Patient Location: Short Stay  Anesthesia Type:General  Level of Consciousness: drowsy and patient cooperative  Airway & Oxygen Therapy: Patient Spontanous Breathing and Patient connected to nasal cannula oxygen  Post-op Assessment: Report given to RN and Post -op Vital signs reviewed and stable  Post vital signs: Reviewed and stable  Last Vitals:  Vitals Value Taken Time  BP 104/70 05/28/23   1259  Temp 36.6 05/28/23   1259  Pulse 72 05/28/23   1259  Resp 18 05/28/23   1259  SpO2 96% 05/28/23   1259    Last Pain:  Vitals:   05/28/23 1240  TempSrc:   PainSc: 0-No pain         Complications: No notable events documented.

## 2023-05-28 NOTE — Op Note (Signed)
[facility] Patient Name: [name] Procedure Date: 05/28/2023 12:28 PM MRN: [id number] Date of Birth: 11/11/1955 Attending MD: Hennie Duos. Marletta Lor , [place], [id number] CSN: [id number] Age: 67 Admit Type: Outpatient Procedure:                Colonoscopy Indications:              High risk colon cancer surveillance: Personal                            history of colon cancer Providers:                Hennie Duos. Marletta Lor, DO, Emilee Tubb RN, RN, Crystal                            Page, Roseanne Kaufman, Pensions consultant Referring MD:              Medicines:                See the Anesthesia note for documentation of the                            administered medications Complications:            No immediate complications. Estimated Blood Loss:     Estimated blood loss was minimal. Procedure:                Pre-Anesthesia Assessment:                           - The anesthesia plan was to use monitored                            anesthesia care (MAC).                           After obtaining informed consent, the colonoscope                            was passed under direct vision. Throughout the                            procedure, the patient's blood pressure, pulse, and                            oxygen saturations were monitored continuously. The                            [id number]-[id number] ([id number]) scope was introduced through                            the transverse colostomy and advanced to the the                            cecum, identified by appendiceal orifice and                            ileocecal valve. The colonoscopy  was performed                            without difficulty. The patient tolerated the                            procedure well. The quality of the bowel                            preparation was evaluated using the BBPS [facility]                            Bowel Preparation Scale) with scores of: Right                            Colon = 2 (minor amount  of residual staining, small                            fragments of stool and/or opaque liquid, but mucosa                            seen well). The total BBPS score equals 2. Fair. Scope In: 12:44:57 PM Scope Out: 12:53:43 PM Scope Withdrawal Time: 0 hours 7 minutes 44 seconds  Total Procedure Duration: 0 hours 8 minutes 46 seconds  Findings:      Two sessile polyps were found in the transverse colon. The polyps were 4       to 5 mm in size. These polyps were removed with a cold snare. Resection       and retrieval were complete.      Multiple medium-mouthed and small-mouthed diverticula were found in the       transverse colon and ascending colon.      The exam was otherwise without abnormality. Impression:               - Two 4 to 5 mm polyps in the transverse colon,                            removed with a cold snare. Resected and retrieved.                           - Diverticulosis in the transverse colon and in the                            ascending colon.                           - The examination was otherwise normal. Moderate Sedation:      Per Anesthesia Care Recommendation:           - Patient has a contact number available for                            emergencies. The signs and symptoms of potential  delayed complications were discussed with the                            patient. Return to normal activities tomorrow.                            Written discharge instructions were provided to the                            patient.                           - Resume previous diet.                           - Continue present medications.                           - Await pathology results.                           - Repeat colonoscopy in 5 years for surveillance.                           - Return to GI clinic PRN. Procedure Code(s):        --- Professional ---                           [phone], Colonoscopy through stoma; with removal of                             tumor(s), polyp(s), or other lesion(s) by snare                            technique Diagnosis Code(s):        --- Professional ---                           U72.536, Personal history of other malignant                            neoplasm of large intestine                           D12.3, Benign neoplasm of transverse colon (hepatic                            flexure or splenic flexure)                           K57.30, Diverticulosis of large intestine without                            perforation or abscess without bleeding CPT copyright 2022 American Medical Association. All rights reserved. The codes documented in this report are preliminary and upon coder review may  be revised to meet current compliance requirements. Hennie Duos.  Marletta Lor, DO Hennie Duos. Marletta Lor, DO 05/28/2023 12:57:31 PM This report has been signed electronically. Number of Addenda: 0

## 2023-05-28 NOTE — Anesthesia Postprocedure Evaluation (Signed)
Anesthesia Post Note  Patient: [name]  Procedure(s) Performed: COLONOSCOPY WITH PROPOFOL POLYPECTOMY  Patient location during evaluation: Phase II Anesthesia Type: General Level of consciousness: awake Pain management: pain level controlled Vital Signs Assessment: post-procedure vital signs reviewed and stable Respiratory status: spontaneous breathing and respiratory function stable Cardiovascular status: blood pressure returned to baseline and stable Postop Assessment: no headache and no apparent nausea or vomiting Anesthetic complications: no Comments: Late entry   No notable events documented.   Last Vitals:  Vitals:   05/28/23 1147 05/28/23 1259  BP: 126/83 109/70  Pulse: 66 73  Resp: 18 18  Temp: 36.7 C 36.6 C  SpO2: 95% 96%    Last Pain:  Vitals:   05/28/23 1259  TempSrc:   PainSc: 0-No pain                 Windell Norfolk

## 2023-05-28 NOTE — Discharge Instructions (Signed)
  Colonoscopy Discharge Instructions  Read the instructions outlined below and refer to this sheet in the next few weeks. These discharge instructions provide you with general information on caring for yourself after you leave the hospital. Your doctor may also give you specific instructions. While your treatment has been planned according to the most current medical practices available, unavoidable complications occasionally occur.   ACTIVITY You may resume your regular activity, but move at a slower pace for the next 24 hours.  Take frequent rest periods for the next 24 hours.  Walking will help get rid of the air and reduce the bloated feeling in your belly (abdomen).  No driving for 24 hours (because of the medicine (anesthesia) used during the test).   Do not sign any important legal documents or operate any machinery for 24 hours (because of the anesthesia used during the test).  NUTRITION Drink plenty of fluids.  You may resume your normal diet as instructed by your doctor.  Begin with a light meal and progress to your normal diet. Heavy or fried foods are harder to digest and may make you feel sick to your stomach (nauseated).  Avoid alcoholic beverages for 24 hours or as instructed.  MEDICATIONS You may resume your normal medications unless your doctor tells you otherwise.  WHAT YOU CAN EXPECT TODAY Some feelings of bloating in the abdomen.  Passage of more gas than usual.  Spotting of blood in your stool or on the toilet paper.  IF YOU HAD POLYPS REMOVED DURING THE COLONOSCOPY: No aspirin products for 7 days or as instructed.  No alcohol for 7 days or as instructed.  Eat a soft diet for the next 24 hours.  FINDING OUT THE RESULTS OF YOUR TEST Not all test results are available during your visit. If your test results are not back during the visit, make an appointment with your caregiver to find out the results. Do not assume everything is normal if you have not heard from your  caregiver or the medical facility. It is important for you to follow up on all of your test results.  SEEK IMMEDIATE MEDICAL ATTENTION IF: You have more than a spotting of blood in your stool.  Your belly is swollen (abdominal distention).  You are nauseated or vomiting.  You have a temperature over 101.  You have abdominal pain or discomfort that is severe or gets worse throughout the day.   Your colonoscopy revealed 2 polyp(s) which I removed successfully. Await pathology results, my office will contact you. I recommend repeating colonoscopy in 5 years for surveillance purposes.   You also have diverticulosis. I would recommend increasing fiber in your diet or adding OTC Benefiber/Metamucil. Be sure to drink at least 4 to 6 glasses of water daily. Follow-up with GI as needed.   I hope you have a great rest of your week!  Elon Alas. Abbey Chatters, D.O. Gastroenterology and Hepatology [facility]

## 2023-05-28 NOTE — H&P (Signed)
Primary Care Physician:  Tommie Sams, DO Primary Gastroenterologist:  Dr. Marletta Lor  Pre-Procedure History & Physical: HPI:  [name] is a 67 y.o. male is here for a colonoscopy to be performed for personal history of rectal cancer, uT2uN0, s/p LAR/ileostomy 03/20/2014 colostomy Aug2015   Past Medical History:  Diagnosis Date   Arthritis    hands and shoulders (left shoulder is worse than right)   Aspiration pneumonia (HCC) 03/24/2014   after surgery for colostomy   BPH (benign prostatic hyperplasia)    Colostomy in place [facility])    Complication of anesthesia 03/24/2014   aspiration pneumonia after surgery for colostomy, no problem with  surgeries since then   Difficulty urinating    Dyspnea    @ times due to hernias, seeing [facility] for  surgical consultation 04-20-2022, can lie flat   GERD (gastroesophageal reflux disease)    History of kidney stones    Hypertension    Ileostomy present (HCC)    Incisional hernia    right abdomen   Parastomal hernia left    Rectal cancer, uT2uN0, s/p LAR/ileostomy 03/20/2014 colostomy Aug2015 12/25/2013   TCS MAY 2015    Restless leg    Right inguinal hernia    Shortening, leg, congenital    Left   Skin irritation    Surrounding ostomy site    Past Surgical History:  Procedure Laterality Date   COLON RESECTION N/A 03/24/2014   Procedure: Diagnostic  laparotomy, takedown of coloanal anastamosis, end colostomy, with wound vac;  Surgeon: Ardeth Sportsman, MD;  [facility]: [facility];  Service: General;  Laterality: N/A;   COLONOSCOPY N/A 12/22/2013   SLF: 1. Normal mucosa in the terminal ilium. 2. Moderate diverticulosis in the transverse colon, descending colon, and sigmoid colon. 3. Rectal bleeding due ot rectal mass and hemorrhoids 4. Moderate sized internal hemorrhoids.    COLONOSCOPY N/A 01/19/2015   Procedure: COLONOSCOPY;  Surgeon: West Bali, MD;  [facility];  Service: Endoscopy;  Laterality: N/A;  1215   COLONOSCOPY N/A  03/22/2018   Procedure: COLONOSCOPY;  Surgeon: West Bali, MD;  [facility];  Service: Endoscopy;  Laterality: N/A;  2:15pm   COLOSTOMY     03-24-2014   ESOPHAGOGASTRODUODENOSCOPY N/A 12/22/2013   SLF: Probable Candia esophagitis2. Probable Barretts esphagus 3. Mild gastritis.    EUS N/A 01/01/2014   Procedure: LOWER ENDOSCOPIC ULTRASOUND (EUS);  Surgeon: Rachael Fee, MD;  Location: [facility];  Service: Endoscopy;  Laterality: N/A;   EXAMINATION UNDER ANESTHESIA  08/25/2014   Procedure: EXAM UNDER ANESTHESIA;  Surgeon: Karie Soda, MD;  Location: [facility];  Service: General;;   EXTRACORPOREAL SHOCK WAVE LITHOTRIPSY Right 07/25/2022   Procedure: EXTRACORPOREAL SHOCK WAVE LITHOTRIPSY (ESWL);  Surgeon: Malen Gauze, MD;  Location: [facility];  Service: Urology;  Laterality: Right;   eye reattachment and ear reattachment  Right 08/21/1969   FLEXIBLE SIGMOIDOSCOPY N/A 03/24/2014   Procedure: FLEXIBLE SIGMOIDOSCOPY;  Surgeon: Ardeth Sportsman, MD;  [facility];  Service: General;  Laterality: N/A;   HIP FRACTURE SURGERY Left age 79 on 04-06-1970   ILEOSTOMY     ILEOSTOMY CLOSURE N/A 08/25/2014   Procedure: TAKEDOWN OF LOOP ILEOSTOMY ;  Surgeon: Karie Soda, MD;  Location: [facility];  Service: General;  Laterality: N/A;   INSERTION OF MESH N/A 04/11/2017   Procedure: INSERTION OF MESH ;  Surgeon: Karie Soda, MD;  Location: [facility];  Service: General;  Laterality: N/A;  BILATERAL TAP  BLOCK   THULIUM LASER TURP (TRANSURETHRAL RESECTION OF PROSTATE) N/A 04/04/2022   Procedure: THULIUM LASER TURP (TRANSURETHRAL RESECTION OF PROSTATE);  Surgeon: Jerilee Field, MD;  Location: [facility];  Service: Urology;  Laterality: N/A;   TOTAL HIP ARTHROPLASTY Left 11/19/2015   Procedure: LEFT TOTAL HIP ARTHROPLASTY ANTERIOR APPROACH;  Surgeon: Kathryne Hitch, MD;  Location: [facility];  Service: Orthopedics;  Laterality: Left;   VENTRAL HERNIA REPAIR N/A 04/11/2017    Procedure: LAPAROSCOPIC INCARCERATED PERISTOMAL AND INCARCERATED INCISIONAL HERNIA X2;  Surgeon: Karie Soda, MD;  [facility];  Service: General;  Laterality: N/A;  BILATERAL TAP BLOCK    Prior to Admission medications   Medication Sig Start Date End Date Taking? Authorizing Provider  amLODipine (NORVASC) 5 MG tablet Take 1 tablet (5 mg total) by mouth daily. 04/10/23  Yes Cook, Jayce G, DO  APPLE CIDER VINEGAR PO Take 1 tablet by mouth in the morning and at bedtime.   Yes [provider]  aspirin EC 81 MG tablet Take 81 mg by mouth daily.   Yes [provider]  baclofen (LIORESAL) 10 MG tablet TAKE 1 TABLET (10 MG TOTAL) BY MOUTH AT BEDTIME AS NEEDED FOR MUSCLE SPASMS. FOR LEG CRAMPING 03/16/23  Yes Cook, Jayce G, DO  Beta Carotene (VITAMIN A) 25000 UNIT capsule Take 25,000 Units by mouth daily.   Yes [provider]  co-enzyme Q-10 30 MG capsule Take 30 mg by mouth daily.   Yes [provider]  metoprolol tartrate (LOPRESSOR) 25 MG tablet Take 1 tablet (25 mg total) by mouth 2 (two) times daily. 04/10/23  Yes Cook, Verdis Frederickson, DO  Multiple Vitamins-Minerals (PRESERVISION AREDS 2 PO) Take 1 capsule by mouth in the morning and at bedtime.   Yes [provider]  nitrofurantoin, macrocrystal-monohydrate, (MACROBID) 100 MG capsule Take 1 capsule (100 mg total) by mouth 2 (two) times daily for 7 days. 05/22/23 05/29/23 Yes Larocco, Eleonore Chiquito, FNP  omeprazole (PRILOSEC) 40 MG capsule TAKE 1 CAPSULE (40 MG TOTAL) BY MOUTH DAILY. 03/16/23  Yes Cook, Jayce G, DO  rosuvastatin (CRESTOR) 10 MG tablet Take 1 tablet (10 mg total) by mouth daily. 03/16/23  Yes Cook, Jayce G, DO  tadalafil (CIALIS) 20 MG tablet Take 1 tablet (20 mg total) by mouth daily as needed. 10/06/22  Yes McKenzie, Mardene Celeste, MD  tadalafil (CIALIS) 5 MG tablet Take 1 tablet (5 mg total) by mouth daily. 10/06/22  Yes McKenzie, Mardene Celeste, MD  tamsulosin (FLOMAX) 0.4 MG CAPS capsule Take 0.4 mg by  mouth daily after supper. 12/28/22  Yes [provider]  Turmeric 500 MG CAPS Take 500 mg by mouth 2 (two) times daily.    Yes [provider]  vitamin B-12 (CYANOCOBALAMIN) 500 MCG tablet Take 1,000 mcg by mouth daily.   Yes [provider]  vitamin C (ASCORBIC ACID) 500 MG tablet Take 500 mg by mouth 2 (two) times daily.   Yes [provider]  cyclobenzaprine (FLEXERIL) 5 MG tablet Take 1 tablet (5 mg total) by mouth 3 (three) times daily as needed for muscle spasms. Patient not taking: Reported on 05/24/2023 09/19/22   Malen Gauze, MD    Allergies as of 05/09/2023   (No Known Allergies)    Family History  Problem Relation Age of Onset   Colon cancer Sister    Emphysema Mother    Heart attack Brother    Colon polyps Neg Hx     Social History  Socioeconomic History   Marital status: Married    Spouse name: Not on file   Number of children: Not on file   Years of education: Not on file   Highest education level: Not on file  Occupational History   Not on file  Tobacco Use   Smoking status: Former    Types: Cigars    Quit date: 2012    Years since quitting: 12.7   Smokeless tobacco: Never  Vaping Use   Vaping status: Never Used  Substance and Sexual Activity   Alcohol use: No    Comment: quit years ago   Drug use: No   Sexual activity: Not on file  Other Topics Concern   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   Social Determinants of Health   Financial Resource Strain: Low Risk  (02/14/2022)   Overall Financial Resource Strain (CARDIA)    Difficulty of Paying Living Expenses: Not hard at all  Food Insecurity: No Food Insecurity (02/14/2022)   Hunger Vital Sign    Worried About Running Out of Food in the Last Year: Never true    Ran Out of Food in the Last Year: Never true  Transportation Needs: No Transportation Needs (02/14/2022)   PRAPARE - Administrator, Civil Service  (Medical): No    Lack of Transportation (Non-Medical): No  Physical Activity: Sufficiently Active (02/14/2022)   Exercise Vital Sign    Days of Exercise per Week: 5 days    Minutes of Exercise per Session: 30 min  Stress: No Stress Concern Present (02/14/2022)   [employer] of Occupational Health - Occupational Stress Questionnaire    Feeling of Stress : Not at all  Social Connections: Socially Integrated (02/14/2022)   Social Connection and Isolation Panel [NHANES]    Frequency of Communication with Friends and Family: More than three times a week    Frequency of Social Gatherings with Friends and Family: More than three times a week    Attends Religious Services: More than 4 times per year    Active Member of [employer] or Organizations: Yes    Attends Engineer, structural: More than 4 times per year    Marital Status: Married  Catering manager Violence: Not At Risk (02/14/2022)   Humiliation, Afraid, Rape, and Kick questionnaire    Fear of Current or Ex-Partner: No    Emotionally Abused: No    Physically Abused: No    Sexually Abused: No    Review of Systems: See HPI, otherwise negative ROS  Physical Exam: Vital signs in last 24 hours: Temp:  [98 F (36.7 C)] 98 F (36.7 C) (10/07 1147) Pulse Rate:  [66] 66 (10/07 1147) Resp:  [18] 18 (10/07 1147) BP: (126)/(83) 126/83 (10/07 1147) SpO2:  [95 %] 95 % (10/07 1147) Weight:  [89.3 kg] 89.3 kg (10/07 1147)   General:   Alert,  Well-developed, well-nourished, pleasant and cooperative in NAD Head:  Normocephalic and atraumatic. Eyes:  Sclera clear, no icterus.   Conjunctiva pink. Ears:  Normal auditory acuity. Nose:  No deformity, discharge,  or lesions. Msk:  Symmetrical without gross deformities. Normal posture. Extremities:  Without clubbing or edema. Neurologic:  Alert and  oriented x4;  grossly normal neurologically. Skin:  Intact without significant lesions or rashes. Psych:  Alert and cooperative. Normal mood  and affect.  Impression/Plan: [name] is here for a colonoscopy to be performed for personal history of rectal  cancer, uT2uN0, s/p LAR/ileostomy 03/20/2014 colostomy Aug2015   The risks of the procedure including infection, bleed, or perforation as well as benefits, limitations, alternatives and imponderables have been reviewed with the patient. Questions have been answered. All parties agreeable.

## 2023-05-28 NOTE — Anesthesia Preprocedure Evaluation (Signed)
Anesthesia Evaluation  Patient identified by MRN, date of birth, ID band Patient awake    Reviewed: Allergy & Precautions, NPO status , Patient's Chart, lab work & pertinent test results  Airway Mallampati: II       Dental   Pulmonary shortness of breath, pneumonia, unresolved, former smoker   Pulmonary exam normal        Cardiovascular hypertension, + DOE  Normal cardiovascular exam     Neuro/Psych    GI/Hepatic ,GERD  ,,  Endo/Other    Renal/GU Renal disease     Musculoskeletal  (+) Arthritis ,    Abdominal Normal abdominal exam  (+)   Peds  Hematology   Anesthesia Other Findings   Reproductive/Obstetrics                             Anesthesia Physical Anesthesia Plan  ASA: 3  Anesthesia Plan: General   Post-op Pain Management:    Induction: Intravenous  PONV Risk Score and Plan: Propofol infusion  Airway Management Planned: Natural Airway and Nasal Cannula  Additional Equipment:   Intra-op Plan:   Post-operative Plan:   Informed Consent: I have reviewed the patients History and Physical, chart, labs and discussed the procedure including the risks, benefits and alternatives for the proposed anesthesia with the patient or authorized representative who has indicated his/her understanding and acceptance.     Dental advisory given  Plan Discussed with: Anesthesiologist  Anesthesia Plan Comments:        Anesthesia Quick Evaluation

## 2023-05-29 LAB — SURGICAL PATHOLOGY

## 2023-06-05 ENCOUNTER — Encounter (HOSPITAL_COMMUNITY): Payer: Self-pay | Admitting: Internal Medicine

## 2023-06-12 ENCOUNTER — Other Ambulatory Visit: Payer: Self-pay | Admitting: Family Medicine

## 2023-06-14 ENCOUNTER — Telehealth: Payer: Self-pay | Admitting: Urology

## 2023-06-14 NOTE — Telephone Encounter (Signed)
Patient asking for a call back to discuss two upcoming surgeries on Nov 2nd and Nov 10.   Please advise.

## 2023-06-14 NOTE — Telephone Encounter (Signed)
Patient returned call to [facility] about changing the date of his procedure please call back.

## 2023-06-15 NOTE — Telephone Encounter (Signed)
Discussed with patient surgery workque updated.

## 2023-06-19 ENCOUNTER — Encounter (HOSPITAL_COMMUNITY): Payer: No Typology Code available for payment source

## 2023-06-26 ENCOUNTER — Encounter: Payer: No Typology Code available for payment source | Admitting: Urology

## 2023-07-11 ENCOUNTER — Encounter: Payer: Self-pay | Admitting: Cardiology

## 2023-07-11 ENCOUNTER — Ambulatory Visit: Payer: No Typology Code available for payment source | Attending: Cardiology | Admitting: Cardiology

## 2023-07-11 VITALS — BP 142/84 | HR 64 | Ht 65.0 in | Wt 194.8 lb

## 2023-07-11 DIAGNOSIS — I259 Chronic ischemic heart disease, unspecified: Secondary | ICD-10-CM

## 2023-07-11 DIAGNOSIS — I10 Essential (primary) hypertension: Secondary | ICD-10-CM

## 2023-07-11 DIAGNOSIS — Z79899 Other long term (current) drug therapy: Secondary | ICD-10-CM

## 2023-07-11 DIAGNOSIS — E782 Mixed hyperlipidemia: Secondary | ICD-10-CM | POA: Diagnosis not present

## 2023-07-11 MED ORDER — CHLORTHALIDONE 25 MG PO TABS: 12.5000 mg | ORAL_TABLET | Freq: Every day | ORAL | 3 refills | Status: DC

## 2023-07-11 NOTE — Progress Notes (Signed)
Cardiology Office Note  Date: 07/11/2023   ID: [name], DOB 1955-09-29, MRN [id number]  History of Present Illness: [name] is a 67 y.o. male last seen in April.  He is here for a routine visit.  He does not report any exertional chest pain and at this point describes NYHA class II dyspnea, no progression.  No palpitations or syncope.  Cardiac testing from May of this year is outlined below including low risk Myoview without definite ischemia and echocardiography revealing LVEF 60 to 65% with mild diastolic dysfunction.  Anticipates two upcoming surgeries in the next few months including abdominal hernia repair and prostate surgery.  I reviewed his medications which include aspirin, Norvasc, Lopressor, and Crestor.  We discussed addition of chlorthalidone for better blood pressure control.  I reviewed his lab work from August.  Physical Exam: VS:  BP (!) 162/80   Pulse 64   Ht 5\' 5"  (1.651 m)   Wt 194 lb 12.8 oz (88.4 kg)   SpO2 94%   BMI 32.42 kg/m , BMI Body mass index is 32.42 kg/m.  Wt Readings from Last 3 Encounters:  07/11/23 194 lb 12.8 oz (88.4 kg)  05/28/23 196 lb 13.9 oz (89.3 kg)  05/24/23 196 lb 13.9 oz (89.3 kg)    General: Patient appears comfortable at rest. HEENT: Conjunctiva and lids normal. Neck: Supple, no elevated JVP or carotid bruits. Lungs: Clear to auscultation, nonlabored breathing at rest. Cardiac: Regular rate and rhythm, no S3 or significant systolic murmur. Extremities: No pitting edema.  ECG:  An ECG dated 04/04/2022 was personally reviewed today and demonstrated:  Sinus bradycardia with prolonged PR interval, possible old inferior infarct pattern.  Labwork: 04/10/2023: ALT 18; AST 15; BUN 22; Creatinine, Ser 0.81; Hemoglobin 14.1; Platelets 305; Potassium 4.8; Sodium 141     Component Value Date/Time   CHOL 140 04/10/2023 0943   TRIG 114 04/10/2023 0943   HDL 40 04/10/2023 0943   CHOLHDL 3.5 04/10/2023 0943   LDLCALC  79 04/10/2023 0943   Other Studies Reviewed Today:  Lexiscan Myoview 01/02/2023:   Stress ECG is negative for ischemia and arrythmias.   LV perfusion is abnormal. There is no evidence of ischemia. There is no evidence of infarction.There is a small reversible perfusion defect present in the mid one third to basal inferior wall likely secondary to adjacent liver and bowel activity.   There is no evidence of ischemia. There is no evidence of infarction.   Left ventricular function is normal. Nuclear stress EF: 58 %.   The study is normal. The study is low risk.  Echocardiogram 01/11/2023:  1. Left ventricular ejection fraction, by estimation, is 60 to 65%. The  left ventricle has normal function. The left ventricle has no regional  wall motion abnormalities. Left ventricular diastolic parameters are  consistent with Grade I diastolic  dysfunction (impaired relaxation).   2. Right ventricular systolic function is normal. The right ventricular  size is normal. Tricuspid regurgitation signal is inadequate for assessing  PA pressure.   3. Left atrial size was mildly dilated.   4. The mitral valve is grossly normal. Trivial mitral valve  regurgitation.   5. The aortic valve is tricuspid. Aortic valve regurgitation is not  visualized. Aortic valve sclerosis/calcification is present, without any  evidence of aortic stenosis.   6. The inferior vena cava is normal in size with greater than 50%  respiratory variability, suggesting right atrial pressure of 3 mmHg.   Assessment and  Plan:  1.  Dyspnea on exertion, stable and NYHA class II by current description.  Cardiac structural and ischemic testing from May of this year was overall low risk.  He anticipates two upcoming surgeries including prostate and abdominal hernia repair.  RCRI perioperative cardiac risk index indicates 6% chance of major adverse cardiac event.  He should be able to proceed as planned.  We are managing him for suspected  underlying ischemic heart disease based on testing over time currently on aspirin, Lopressor, Norvasc, and Crestor.   2.  Mixed hyperlipidemia, LDL 79 by lab work in August of this year.  Continue Crestor.   3.  History of rectal cancer status post prior colon surgery and ultimately longstanding colostomy.  4.  Primary hypertension, continue Norvasc and Lopressor.  Adding chlorthalidone 12.5 mg daily.  Recheck BMET in 2 weeks.  Disposition:  Follow up  6 months.  Signed, Jonelle Sidle, M.D., F.A.C.C. [facility]

## 2023-07-11 NOTE — Patient Instructions (Addendum)
Medication Instructions:   START Chlorthalidone 12.5 mg (1/2 tablet)  daily   Labwork: BMET at [facility] lab in 2 weeks (12/4)  Testing/Procedures: None today  Follow-Up: 6 months  Any Other Special Instructions Will Be Listed Below (If Applicable).  If you need a refill on your cardiac medications before your next appointment, please call your pharmacy.

## 2023-07-21 ENCOUNTER — Other Ambulatory Visit: Payer: Self-pay | Admitting: Family Medicine

## 2023-07-31 DIAGNOSIS — Z7982 Long term (current) use of aspirin: Secondary | ICD-10-CM | POA: Diagnosis not present

## 2023-07-31 DIAGNOSIS — K660 Peritoneal adhesions (postprocedural) (postinfection): Secondary | ICD-10-CM | POA: Diagnosis not present

## 2023-07-31 DIAGNOSIS — K435 Parastomal hernia without obstruction or  gangrene: Secondary | ICD-10-CM | POA: Diagnosis not present

## 2023-07-31 DIAGNOSIS — Z932 Ileostomy status: Secondary | ICD-10-CM | POA: Diagnosis not present

## 2023-08-01 DIAGNOSIS — Z933 Colostomy status: Secondary | ICD-10-CM | POA: Diagnosis not present

## 2023-08-01 DIAGNOSIS — K435 Parastomal hernia without obstruction or  gangrene: Secondary | ICD-10-CM | POA: Diagnosis not present

## 2023-08-01 DIAGNOSIS — K660 Peritoneal adhesions (postprocedural) (postinfection): Secondary | ICD-10-CM | POA: Diagnosis not present

## 2023-08-01 DIAGNOSIS — Z8776 Personal history of (corrected) congenital diaphragmatic hernia or other congenital diaphragm malformations: Secondary | ICD-10-CM | POA: Diagnosis not present

## 2023-08-05 DIAGNOSIS — Z85048 Personal history of other malignant neoplasm of rectum, rectosigmoid junction, and anus: Secondary | ICD-10-CM | POA: Diagnosis not present

## 2023-08-05 DIAGNOSIS — K435 Parastomal hernia without obstruction or  gangrene: Secondary | ICD-10-CM | POA: Diagnosis not present

## 2023-08-05 DIAGNOSIS — R109 Unspecified abdominal pain: Secondary | ICD-10-CM | POA: Diagnosis not present

## 2023-08-05 DIAGNOSIS — I10 Essential (primary) hypertension: Secondary | ICD-10-CM | POA: Diagnosis not present

## 2023-08-05 DIAGNOSIS — K9401 Colostomy hemorrhage: Secondary | ICD-10-CM | POA: Diagnosis not present

## 2023-08-05 DIAGNOSIS — R1032 Left lower quadrant pain: Secondary | ICD-10-CM | POA: Diagnosis not present

## 2023-08-05 DIAGNOSIS — K219 Gastro-esophageal reflux disease without esophagitis: Secondary | ICD-10-CM | POA: Diagnosis not present

## 2023-08-05 DIAGNOSIS — T148XXA Other injury of unspecified body region, initial encounter: Secondary | ICD-10-CM | POA: Insufficient documentation

## 2023-08-05 DIAGNOSIS — C20 Malignant neoplasm of rectum: Secondary | ICD-10-CM | POA: Diagnosis not present

## 2023-08-05 DIAGNOSIS — K9419 Other complications of enterostomy: Secondary | ICD-10-CM | POA: Diagnosis not present

## 2023-08-06 ENCOUNTER — Encounter: Payer: Self-pay | Admitting: General Surgery

## 2023-08-06 DIAGNOSIS — I10 Essential (primary) hypertension: Secondary | ICD-10-CM | POA: Diagnosis not present

## 2023-08-06 DIAGNOSIS — M7981 Nontraumatic hematoma of soft tissue: Secondary | ICD-10-CM | POA: Diagnosis not present

## 2023-08-06 DIAGNOSIS — K9419 Other complications of enterostomy: Secondary | ICD-10-CM | POA: Diagnosis not present

## 2023-08-06 DIAGNOSIS — K435 Parastomal hernia without obstruction or  gangrene: Secondary | ICD-10-CM | POA: Diagnosis not present

## 2023-08-06 DIAGNOSIS — C20 Malignant neoplasm of rectum: Secondary | ICD-10-CM | POA: Diagnosis not present

## 2023-08-08 ENCOUNTER — Telehealth: Payer: Self-pay

## 2023-08-08 NOTE — Transitions of Care (Post Inpatient/ED Visit) (Unsigned)
   08/08/2023  Name: [name] MRN: [id number] DOB: 05-Nov-1955  Today's TOC FU Call Status: Today's TOC FU Call Status:: Unsuccessful Call (1st Attempt) Unsuccessful Call (1st Attempt) Date: 08/08/23  Attempted to reach the patient regarding the most recent Inpatient/ED visit.  Follow Up Plan: Additional outreach attempts will be made to reach the patient to complete the Transitions of Care (Post Inpatient/ED visit) call.   Signature Karena Addison, LPN [facility] Nurse Health Advisor Direct Dial [phone]

## 2023-08-09 NOTE — Transitions of Care (Post Inpatient/ED Visit) (Signed)
08/09/2023  Name: [name] MRN: [id number] DOB: 11-08-1955  Today's TOC FU Call Status: Today's TOC FU Call Status:: Successful TOC FU Call Completed Unsuccessful Call (1st Attempt) Date: 08/08/23 [facility] FU Call Complete Date: 08/09/23 Patient's Name and Date of Birth confirmed.  Transition Care Management Follow-up Telephone Call Date of Discharge: 08/07/23 Discharge Facility: Other [facility]) Name of Other (Non[facility]) Discharge Facility: [facility] Type of Discharge: Inpatient Admission Primary Inpatient Discharge Diagnosis:: injury How have you been since you were released from the hospital?: Better Any questions or concerns?: No  Items Reviewed: Did you receive and understand the discharge instructions provided?: Yes Any new allergies since your discharge?: No Dietary orders reviewed?: Yes Do you have support at home?: Yes People in Home: spouse  Medications Reviewed Today: Medications Reviewed Today     Reviewed by Karena Addison, LPN (Licensed Practical Nurse) on 08/09/23 at 1241  Med List Status: <None>   Medication Order Taking? Sig Documenting Provider Last Dose Status Informant  amLODipine (NORVASC) 5 MG tablet [id number] No Take 1 tablet (5 mg total) by mouth daily. Tommie Sams, DO Taking Active Self  APPLE CIDER VINEGAR PO [id number] No Take 1 tablet by mouth in the morning and at bedtime. [provider] Taking Active Self  aspirin EC 81 MG tablet [id number] No Take 81 mg by mouth daily. [provider] Taking Active Self  baclofen (LIORESAL) 10 MG tablet [id number]  TAKE 1 TABLET (10 MG TOTAL) BY MOUTH AT BEDTIME AS NEEDED FOR MUSCLE SPASMS. FOR LEG CRAMPING Tommie Sams, DO  Active   Beta Carotene (VITAMIN A) 25000 UNIT capsule [id number] No Take 25,000 Units by mouth daily. [provider] Taking Active Self  chlorthalidone (HYGROTON) 25 MG tablet [id number]  Take 0.5 tablets (12.5 mg total) by mouth daily. Jonelle Sidle,  MD  Active   co-enzyme Q-10 30 MG capsule [id number] No Take 30 mg by mouth daily. [provider] Taking Active Self  cyclobenzaprine (FLEXERIL) 5 MG tablet [id number] No Take 1 tablet (5 mg total) by mouth 3 (three) times daily as needed for muscle spasms. McKenzie, Mardene Celeste, MD Taking Active Self  metoprolol tartrate (LOPRESSOR) 25 MG tablet [id number] No Take 1 tablet (25 mg total) by mouth 2 (two) times daily. Tommie Sams, DO Taking Active Self  Multiple Vitamins-Minerals (PRESERVISION AREDS 2 PO) [id number] No Take 1 capsule by mouth in the morning and at bedtime. [provider] Taking Active Self  omeprazole (PRILOSEC) 40 MG capsule [id number] No TAKE 1 CAPSULE (40 MG TOTAL) BY MOUTH DAILY. Tommie Sams, DO Taking Active   rosuvastatin (CRESTOR) 10 MG tablet [id number] No Take 1 tablet (10 mg total) by mouth daily. Tommie Sams, DO Taking Active Self  tadalafil (CIALIS) 20 MG tablet [id number] No Take 1 tablet (20 mg total) by mouth daily as needed. McKenzie, Mardene Celeste, MD Taking Active Self  tadalafil (CIALIS) 5 MG tablet [id number] No Take 1 tablet (5 mg total) by mouth daily. McKenzie, Mardene Celeste, MD Taking Active Self  tamsulosin [facility]) 0.4 MG CAPS capsule [id number] No Take 0.4 mg by mouth daily after supper. [provider] Taking Active Self  Turmeric 500 MG CAPS [id number] No Take 500 mg by mouth 2 (two) times daily.  [provider] Taking Active Self  vitamin B-12 (CYANOCOBALAMIN) 500 MCG tablet [id number] No Take 1,000 mcg by mouth daily. [provider] Taking Active Self  vitamin C (ASCORBIC ACID) 500 MG tablet [id number] No  Take 500 mg by mouth 2 (two) times daily. [provider] Taking Active Self            Home Care and Equipment/Supplies: Were Home Health Services Ordered?: NA Any new equipment or medical supplies ordered?: NA  Functional Questionnaire: Do you need assistance with bathing/showering or dressing?: No Do  you need assistance with meal preparation?: No Do you need assistance with eating?: No Do you have difficulty maintaining continence: No Do you need assistance with getting out of bed/getting out of a chair/moving?: No Do you have difficulty managing or taking your medications?: No  Follow up appointments reviewed: PCP Follow-up appointment confirmed?: Yes Date of PCP follow-up appointment?: 08/17/23 Follow-up Provider: [facility] Follow-up appointment confirmed?: Yes Date of Specialist follow-up appointment?: 08/24/23 Follow-Up Specialty Provider:: surgeon Do you need transportation to your follow-up appointment?: No Do you understand care options if your condition(s) worsen?: Yes-patient verbalized understanding    SIGNATURE Karena Addison, LPN [facility] Nurse Health Advisor Direct Dial [phone]

## 2023-08-13 ENCOUNTER — Telehealth: Payer: Self-pay | Admitting: Urology

## 2023-08-13 DIAGNOSIS — K435 Parastomal hernia without obstruction or  gangrene: Secondary | ICD-10-CM | POA: Diagnosis not present

## 2023-08-13 NOTE — Telephone Encounter (Signed)
Please cancel his surgery until  the end of January or beginning of February he had hernia surgery that is not healing well.   Please call when you are back in office

## 2023-08-17 ENCOUNTER — Ambulatory Visit (INDEPENDENT_AMBULATORY_CARE_PROVIDER_SITE_OTHER): Payer: No Typology Code available for payment source | Admitting: Family Medicine

## 2023-08-17 VITALS — BP 132/82 | HR 70 | Temp 97.7°F | Ht 65.0 in | Wt 187.0 lb

## 2023-08-17 DIAGNOSIS — Z933 Colostomy status: Secondary | ICD-10-CM | POA: Diagnosis not present

## 2023-08-17 DIAGNOSIS — I10 Essential (primary) hypertension: Secondary | ICD-10-CM | POA: Diagnosis not present

## 2023-08-17 DIAGNOSIS — K219 Gastro-esophageal reflux disease without esophagitis: Secondary | ICD-10-CM | POA: Diagnosis not present

## 2023-08-17 MED ORDER — PANTOPRAZOLE SODIUM 40 MG PO TBEC: 40.0000 mg | DELAYED_RELEASE_TABLET | Freq: Two times a day (BID) | ORAL | 1 refills | Status: DC

## 2023-08-17 NOTE — Progress Notes (Signed)
Subjective:  Patient ID: [name], male    DOB: 1955/09/26  Age: 67 y.o. MRN: [id number]  CC: Hospital follow-up   HPI:  68 year old male presents for hospital follow-up.  Patient recently underwent repair of parastomal hernia.  Subsequently ended up back in the hospital for hematoma and ileus.  Patient states that he is doing much better at this time.  He still reports some abdominal soreness.  Needs ostomy supplies.  Patient also notes that he has had some tingling intermittently to the inner thighs.  BP stable here today on metoprolol, amlodipine, and chlorthalidone.  Additionally, patient states that he is having more reflux.  He reports recent worsening since he had surgery.  Compliant with omeprazole 40 mg daily.  No epigastric pain.   Patient Active Problem List   Diagnosis Date Noted   Kidney stones 01/23/2023   Erectile dysfunction 01/23/2023   Benign prostatic hyperplasia 01/23/2023   DOE (dyspnea on exertion) 10/10/2022   Hyperlipidemia 09/14/2021   Colostomy in place [facility])    Essential hypertension, benign 05/12/2014   Obesity (BMI 30-39.9) 03/24/2014   Rectal cancer, uT2uN0, s/p LAR/ileostomy 03/20/2014 colostomy Aug2015 12/25/2013   GERD (gastroesophageal reflux disease) 11/27/2013    Social Hx   Social History   Socioeconomic History   Marital status: Married    Spouse name: Not on file   Number of children: Not on file   Years of education: Not on file   Highest education level: Not on file  Occupational History   Not on file  Tobacco Use   Smoking status: Former    Types: Cigars    Quit date: 2012    Years since quitting: 12.9   Smokeless tobacco: Never  Vaping Use   Vaping status: Never Used  Substance and Sexual Activity   Alcohol use: No    Comment: quit years ago   Drug use: No   Sexual activity: Not on file  Other Topics Concern   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   Social Drivers  of Corporate investment banker Strain: Low Risk  (02/14/2022)   Overall Financial Resource Strain (CARDIA)    Difficulty of Paying Living Expenses: Not hard at all  Food Insecurity: No Food Insecurity (02/14/2022)   Hunger Vital Sign    Worried About Running Out of Food in the Last Year: Never true    Ran Out of Food in the Last Year: Never true  Transportation Needs: No Transportation Needs (02/14/2022)   PRAPARE - Administrator, Civil Service (Medical): No    Lack of Transportation (Non-Medical): No  Physical Activity: Sufficiently Active (02/14/2022)   Exercise Vital Sign    Days of Exercise per Week: 5 days    Minutes of Exercise per Session: 30 min  Stress: No Stress Concern Present (02/14/2022)   [employer] of Occupational Health - Occupational Stress Questionnaire    Feeling of Stress : Not at all  Social Connections: Socially Integrated (02/14/2022)   Social Connection and Isolation Panel [NHANES]    Frequency of Communication with Friends and Family: More than three times a week    Frequency of Social Gatherings with Friends and Family: More than three times a week    Attends Religious Services: More than 4 times per year    Active Member of [employer] or Organizations: Yes    Attends Banker Meetings: More than 4 times per year  Marital Status: Married    Review of Systems Per HPI  Objective:  BP 132/82   Pulse 70   Temp 97.7 F (36.5 C)   Ht 5\' 5"  (1.651 m)   Wt 187 lb (84.8 kg)   SpO2 96%   BMI 31.12 kg/m      08/17/2023   10:50 AM 07/11/2023    9:30 AM 07/11/2023    9:03 AM  BP/Weight  Systolic BP 132 142 162  Diastolic BP 82 84 80  Wt. (Lbs) 187  194.8  BMI 31.12 kg/m2  32.42 kg/m2    Physical Exam Vitals and nursing note reviewed.  Constitutional:      General: He is not in acute distress.    Appearance: Normal appearance.  HENT:     Head: Normocephalic and atraumatic.  Cardiovascular:     Rate and Rhythm: Normal  rate and regular rhythm.  Pulmonary:     Effort: Pulmonary effort is normal.     Breath sounds: Normal breath sounds.  Abdominal:     General: There is no distension.     Palpations: Abdomen is soft.     Comments: Mild tenderness around his stoma.  No current leakage around the stoma.  Neurological:     Mental Status: He is alert.  Psychiatric:        Mood and Affect: Mood normal.        Behavior: Behavior normal.     Lab Results  Component Value Date   WBC 6.1 04/10/2023   HGB 14.1 04/10/2023   HCT 42.7 04/10/2023   PLT 305 04/10/2023   GLUCOSE 98 04/10/2023   CHOL 140 04/10/2023   TRIG 114 04/10/2023   HDL 40 04/10/2023   LDLCALC 79 04/10/2023   ALT 18 04/10/2023   AST 15 04/10/2023   NA 141 04/10/2023   K 4.8 04/10/2023   CL 103 04/10/2023   CREATININE 0.81 04/10/2023   BUN 22 04/10/2023   CO2 24 04/10/2023   TSH 2.029 10/18/2018   INR 0.99 12/25/2013   HGBA1C 5.7 (H) 04/10/2023     Assessment & Plan:   Problem List Items Addressed This Visit       Cardiovascular and Mediastinum   Essential hypertension, benign (Chronic)   Stable. Continue current medications.        Digestive   GERD (gastroesophageal reflux disease) (Chronic)   Currently uncontrolled with recent worsening after surgery. Changing to Protonix 40 mg BID.      Relevant Medications   pantoprazole (PROTONIX) 40 MG tablet     Other   Colostomy in place [facility]) - Primary (Chronic)   Patient reports that he would like home health nurse to help him with changes as it is difficult for him to do.  Referral placed.  Will send in Rx for ostomy supplies.      Relevant Orders   Ambulatory referral to Home Health    Meds ordered this encounter  Medications   pantoprazole (PROTONIX) 40 MG tablet    Sig: Take 1 tablet (40 mg total) by mouth 2 (two) times daily before a meal.    Dispense:  180 tablet    Refill:  1   Amijah Timothy DO [facility]

## 2023-08-17 NOTE — Assessment & Plan Note (Addendum)
Currently uncontrolled with recent worsening after surgery. Changing to Protonix 40 mg BID.

## 2023-08-17 NOTE — Patient Instructions (Signed)
We will take care of supplies.  Referral placed for nurse to come to the house.  Follow up with your surgeon.  If you have any issues with the site, please call the surgery office.

## 2023-08-17 NOTE — Assessment & Plan Note (Signed)
Patient reports that he would like home health nurse to help him with changes as it is difficult for him to do.  Referral placed.  Will send in Rx for ostomy supplies.

## 2023-08-17 NOTE — Assessment & Plan Note (Signed)
Stable.  Continue current medications.

## 2023-08-20 NOTE — Telephone Encounter (Signed)
Communication updated in surgery workque.

## 2023-08-23 ENCOUNTER — Other Ambulatory Visit: Payer: Self-pay

## 2023-08-23 DIAGNOSIS — Z933 Colostomy status: Secondary | ICD-10-CM

## 2023-08-24 ENCOUNTER — Telehealth: Payer: Self-pay | Admitting: *Deleted

## 2023-08-24 DIAGNOSIS — E785 Hyperlipidemia, unspecified: Secondary | ICD-10-CM | POA: Diagnosis not present

## 2023-08-24 DIAGNOSIS — Z85040 Personal history of malignant carcinoid tumor of rectum: Secondary | ICD-10-CM | POA: Diagnosis not present

## 2023-08-24 DIAGNOSIS — I10 Essential (primary) hypertension: Secondary | ICD-10-CM | POA: Diagnosis not present

## 2023-08-24 DIAGNOSIS — E669 Obesity, unspecified: Secondary | ICD-10-CM | POA: Diagnosis not present

## 2023-08-24 DIAGNOSIS — Z433 Encounter for attention to colostomy: Secondary | ICD-10-CM | POA: Diagnosis not present

## 2023-08-24 DIAGNOSIS — Z7982 Long term (current) use of aspirin: Secondary | ICD-10-CM | POA: Diagnosis not present

## 2023-08-24 DIAGNOSIS — K219 Gastro-esophageal reflux disease without esophagitis: Secondary | ICD-10-CM | POA: Diagnosis not present

## 2023-08-24 DIAGNOSIS — Z6830 Body mass index (BMI) 30.0-30.9, adult: Secondary | ICD-10-CM | POA: Diagnosis not present

## 2023-08-24 DIAGNOSIS — Z48815 Encounter for surgical aftercare following surgery on the digestive system: Secondary | ICD-10-CM | POA: Diagnosis not present

## 2023-08-24 DIAGNOSIS — N401 Enlarged prostate with lower urinary tract symptoms: Secondary | ICD-10-CM | POA: Diagnosis not present

## 2023-08-24 NOTE — Telephone Encounter (Signed)
 Verbal order given to Phylliss Bob with [facility]

## 2023-08-24 NOTE — Telephone Encounter (Signed)
 Copied from CRM [id number]. Topic: General - Other >> Aug 24, 2023  1:34 PM Bascom RAMAN wrote: Reason for CRM: Powell Case with [facility], just completed start of care. Patient does not need ostomy supplies. She is requesting care once a week with nursing for 8 weeks. Patient does not need physical therapy. Callback number [phone] Office number is [phone]

## 2023-08-24 NOTE — Telephone Encounter (Signed)
 Tommie Sams, DO     Okay to give verbal for this.

## 2023-08-27 DIAGNOSIS — Z933 Colostomy status: Secondary | ICD-10-CM | POA: Diagnosis not present

## 2023-08-29 DIAGNOSIS — K435 Parastomal hernia without obstruction or  gangrene: Secondary | ICD-10-CM | POA: Diagnosis not present

## 2023-08-30 ENCOUNTER — Telehealth: Payer: Self-pay

## 2023-08-30 NOTE — Telephone Encounter (Signed)
 Reason for CRM: Tully, with [facility], calling back to follow up. States they have not heard back from nurse/provider in regards to pt's plan of care. Please give her a call at [phone].   Called E. Lopez back and she informed me we will need to fill out a packet for this pt for home health. Packet has not been sent yet

## 2023-09-03 ENCOUNTER — Encounter: Payer: No Typology Code available for payment source | Admitting: Urology

## 2023-09-03 DIAGNOSIS — N401 Enlarged prostate with lower urinary tract symptoms: Secondary | ICD-10-CM | POA: Diagnosis not present

## 2023-09-03 DIAGNOSIS — Z6830 Body mass index (BMI) 30.0-30.9, adult: Secondary | ICD-10-CM | POA: Diagnosis not present

## 2023-09-03 DIAGNOSIS — Z85040 Personal history of malignant carcinoid tumor of rectum: Secondary | ICD-10-CM | POA: Diagnosis not present

## 2023-09-03 DIAGNOSIS — Z7982 Long term (current) use of aspirin: Secondary | ICD-10-CM | POA: Diagnosis not present

## 2023-09-03 DIAGNOSIS — Z48815 Encounter for surgical aftercare following surgery on the digestive system: Secondary | ICD-10-CM | POA: Diagnosis not present

## 2023-09-03 DIAGNOSIS — Z433 Encounter for attention to colostomy: Secondary | ICD-10-CM | POA: Diagnosis not present

## 2023-09-03 DIAGNOSIS — I10 Essential (primary) hypertension: Secondary | ICD-10-CM | POA: Diagnosis not present

## 2023-09-03 DIAGNOSIS — K219 Gastro-esophageal reflux disease without esophagitis: Secondary | ICD-10-CM | POA: Diagnosis not present

## 2023-09-03 DIAGNOSIS — E785 Hyperlipidemia, unspecified: Secondary | ICD-10-CM | POA: Diagnosis not present

## 2023-09-03 DIAGNOSIS — E669 Obesity, unspecified: Secondary | ICD-10-CM | POA: Diagnosis not present

## 2023-09-07 DIAGNOSIS — Z433 Encounter for attention to colostomy: Secondary | ICD-10-CM | POA: Diagnosis not present

## 2023-09-07 DIAGNOSIS — N401 Enlarged prostate with lower urinary tract symptoms: Secondary | ICD-10-CM | POA: Diagnosis not present

## 2023-09-07 DIAGNOSIS — Z48815 Encounter for surgical aftercare following surgery on the digestive system: Secondary | ICD-10-CM | POA: Diagnosis not present

## 2023-09-07 DIAGNOSIS — Z85040 Personal history of malignant carcinoid tumor of rectum: Secondary | ICD-10-CM | POA: Diagnosis not present

## 2023-09-07 DIAGNOSIS — K219 Gastro-esophageal reflux disease without esophagitis: Secondary | ICD-10-CM | POA: Diagnosis not present

## 2023-09-07 DIAGNOSIS — Z6830 Body mass index (BMI) 30.0-30.9, adult: Secondary | ICD-10-CM | POA: Diagnosis not present

## 2023-09-07 DIAGNOSIS — Z7982 Long term (current) use of aspirin: Secondary | ICD-10-CM | POA: Diagnosis not present

## 2023-09-07 DIAGNOSIS — I10 Essential (primary) hypertension: Secondary | ICD-10-CM | POA: Diagnosis not present

## 2023-09-07 DIAGNOSIS — E669 Obesity, unspecified: Secondary | ICD-10-CM | POA: Diagnosis not present

## 2023-09-07 DIAGNOSIS — E785 Hyperlipidemia, unspecified: Secondary | ICD-10-CM | POA: Diagnosis not present

## 2023-09-15 ENCOUNTER — Other Ambulatory Visit: Payer: Self-pay | Admitting: Family Medicine

## 2023-09-20 ENCOUNTER — Encounter (HOSPITAL_COMMUNITY)
Admission: RE | Admit: 2023-09-20 | Discharge: 2023-09-20 | Disposition: A | Discharge status: Home / Self Care | Payer: No Typology Code available for payment source | Source: Ambulatory Visit | Attending: Urology | Admitting: Urology

## 2023-09-20 DIAGNOSIS — Z01818 Encounter for other preprocedural examination: Secondary | ICD-10-CM | POA: Insufficient documentation

## 2023-09-20 LAB — CBC
HCT: 36.8 % — ABNORMAL LOW (ref 39.0–52.0)
Hemoglobin: 11.6 g/dL — ABNORMAL LOW (ref 13.0–17.0)
MCH: 27.2 pg (ref 26.0–34.0)
MCHC: 31.5 g/dL (ref 30.0–36.0)
MCV: 86.2 fL (ref 80.0–100.0)
Platelets: 467 10*3/uL — ABNORMAL HIGH (ref 150–400)
RBC: 4.27 MIL/uL (ref 4.22–5.81)
RDW: 14.3 % (ref 11.5–15.5)
WBC: 7.2 10*3/uL (ref 4.0–10.5)
nRBC: 0 % (ref 0.0–0.2)

## 2023-09-20 LAB — BASIC METABOLIC PANEL
Anion gap: 11 (ref 5–15)
BUN: 21 mg/dL (ref 8–23)
CO2: 25 mmol/L (ref 22–32)
Calcium: 8.7 mg/dL — ABNORMAL LOW (ref 8.9–10.3)
Chloride: 102 mmol/L (ref 98–111)
Creatinine, Ser: 0.86 mg/dL (ref 0.61–1.24)
GFR, Estimated: >60 mL/min (ref >60)
Glucose, Bld: 145 mg/dL — ABNORMAL HIGH (ref 70–99)
Potassium: 3.2 mmol/L — ABNORMAL LOW (ref 3.5–5.1)
Sodium: 138 mmol/L (ref 135–145)

## 2023-09-24 ENCOUNTER — Encounter (HOSPITAL_COMMUNITY)
Admission: RE | Disposition: A | Discharge status: Home / Self Care | Payer: Self-pay | Source: Home / Self Care | Attending: Urology

## 2023-09-24 ENCOUNTER — Encounter (HOSPITAL_COMMUNITY): Payer: Self-pay | Admitting: Urology

## 2023-09-24 ENCOUNTER — Ambulatory Visit (HOSPITAL_COMMUNITY): Payer: No Typology Code available for payment source | Admitting: Anesthesiology

## 2023-09-24 ENCOUNTER — Ambulatory Visit (HOSPITAL_BASED_OUTPATIENT_CLINIC_OR_DEPARTMENT_OTHER): Payer: No Typology Code available for payment source | Admitting: Anesthesiology

## 2023-09-24 ENCOUNTER — Observation Stay (HOSPITAL_COMMUNITY)
Admission: RE | Admit: 2023-09-24 | Discharge: 2023-09-25 | Disposition: A | Discharge status: Home / Self Care | Payer: No Typology Code available for payment source | Attending: Urology | Admitting: Urology

## 2023-09-24 DIAGNOSIS — N138 Other obstructive and reflux uropathy: Secondary | ICD-10-CM | POA: Diagnosis not present

## 2023-09-24 DIAGNOSIS — R39198 Other difficulties with micturition: Secondary | ICD-10-CM | POA: Diagnosis not present

## 2023-09-24 DIAGNOSIS — N400 Enlarged prostate without lower urinary tract symptoms: Secondary | ICD-10-CM

## 2023-09-24 DIAGNOSIS — Z96642 Presence of left artificial hip joint: Secondary | ICD-10-CM | POA: Insufficient documentation

## 2023-09-24 DIAGNOSIS — I10 Essential (primary) hypertension: Secondary | ICD-10-CM | POA: Insufficient documentation

## 2023-09-24 DIAGNOSIS — N401 Enlarged prostate with lower urinary tract symptoms: Secondary | ICD-10-CM | POA: Diagnosis not present

## 2023-09-24 DIAGNOSIS — Z85048 Personal history of other malignant neoplasm of rectum, rectosigmoid junction, and anus: Secondary | ICD-10-CM | POA: Insufficient documentation

## 2023-09-24 DIAGNOSIS — Z87891 Personal history of nicotine dependence: Secondary | ICD-10-CM | POA: Diagnosis not present

## 2023-09-24 HISTORY — PX: CYSTOSCOPY: SHX5120

## 2023-09-24 HISTORY — PX: TRANSURETHRAL RESECTION OF PROSTATE: SHX73

## 2023-09-24 LAB — BASIC METABOLIC PANEL
Anion gap: 8 (ref 5–15)
BUN: 19 mg/dL (ref 8–23)
CO2: 27 mmol/L (ref 22–32)
Calcium: 8.8 mg/dL — ABNORMAL LOW (ref 8.9–10.3)
Chloride: 102 mmol/L (ref 98–111)
Creatinine, Ser: 0.82 mg/dL (ref 0.61–1.24)
GFR, Estimated: >60 mL/min (ref >60)
Glucose, Bld: 98 mg/dL (ref 70–99)
Potassium: 3.8 mmol/L (ref 3.5–5.1)
Sodium: 137 mmol/L (ref 135–145)

## 2023-09-24 LAB — CBC
HCT: 38.2 % — ABNORMAL LOW (ref 39.0–52.0)
Hemoglobin: 11.8 g/dL — ABNORMAL LOW (ref 13.0–17.0)
MCH: 26.7 pg (ref 26.0–34.0)
MCHC: 30.9 g/dL (ref 30.0–36.0)
MCV: 86.4 fL (ref 80.0–100.0)
Platelets: 334 10*3/uL (ref 150–400)
RBC: 4.42 MIL/uL (ref 4.22–5.81)
RDW: 14.4 % (ref 11.5–15.5)
WBC: 7.7 10*3/uL (ref 4.0–10.5)
nRBC: 0 % (ref 0.0–0.2)

## 2023-09-24 SURGERY — CYSTOSCOPY
Anesthesia: General | Site: Bladder

## 2023-09-24 MED ORDER — LACTATED RINGERS IV SOLN: INTRAVENOUS | Status: DC

## 2023-09-24 MED ORDER — ONDANSETRON HCL 4 MG/2ML IJ SOLN
INTRAMUSCULAR | Status: DC | PRN
  Administered 2023-09-24: 4 mg via INTRAVENOUS

## 2023-09-24 MED ORDER — DEXAMETHASONE SODIUM PHOSPHATE 10 MG/ML IJ SOLN
INTRAMUSCULAR | Status: AC
  Filled 2023-09-24: qty 1

## 2023-09-24 MED ORDER — LIDOCAINE HCL (PF) 2 % IJ SOLN
INTRAMUSCULAR | Status: DC | PRN
  Administered 2023-09-24: 60 mg via INTRADERMAL

## 2023-09-24 MED ORDER — STERILE WATER FOR IRRIGATION IR SOLN
Status: DC | PRN
  Administered 2023-09-24: 500 mL

## 2023-09-24 MED ORDER — DIPHENHYDRAMINE HCL 50 MG/ML IJ SOLN: 12.5000 mg | Freq: Four times a day (QID) | INTRAMUSCULAR | Status: DC | PRN

## 2023-09-24 MED ORDER — MIDAZOLAM HCL 2 MG/2ML IJ SOLN
INTRAMUSCULAR | Status: AC
  Filled 2023-09-24: qty 2

## 2023-09-24 MED ORDER — SENNOSIDES-DOCUSATE SODIUM 8.6-50 MG PO TABS
2.0000 | ORAL_TABLET | Freq: Every day | ORAL | Status: DC
  Administered 2023-09-24: 2 via ORAL
  Filled 2023-09-24: qty 2

## 2023-09-24 MED ORDER — SODIUM CHLORIDE 0.9% FLUSH: 3.0000 mL | INTRAVENOUS | Status: DC | PRN

## 2023-09-24 MED ORDER — AMLODIPINE BESYLATE 5 MG PO TABS
5.0000 mg | ORAL_TABLET | Freq: Every day | ORAL | Status: DC
  Administered 2023-09-25: 5 mg via ORAL
  Filled 2023-09-24: qty 1

## 2023-09-24 MED ORDER — FENTANYL CITRATE (PF) 100 MCG/2ML IJ SOLN
INTRAMUSCULAR | Status: AC
  Filled 2023-09-24: qty 2

## 2023-09-24 MED ORDER — ZOLPIDEM TARTRATE 5 MG PO TABS
5.0000 mg | ORAL_TABLET | Freq: Every evening | ORAL | Status: DC | PRN
Start: 2023-09-24 — End: 2023-09-25

## 2023-09-24 MED ORDER — CEFAZOLIN SODIUM-DEXTROSE 2-4 GM/100ML-% IV SOLN
INTRAVENOUS | Status: AC
  Filled 2023-09-24: qty 100

## 2023-09-24 MED ORDER — ACETAMINOPHEN 325 MG PO TABS
650.0000 mg | ORAL_TABLET | ORAL | Status: DC | PRN
Start: 2023-09-24 — End: 2023-09-25
  Administered 2023-09-24: 650 mg via ORAL
  Filled 2023-09-24: qty 2

## 2023-09-24 MED ORDER — CHLORTHALIDONE 25 MG PO TABS
12.5000 mg | ORAL_TABLET | Freq: Every day | ORAL | Status: DC
  Administered 2023-09-25: 12.5 mg via ORAL
  Filled 2023-09-24: qty 1

## 2023-09-24 MED ORDER — ONDANSETRON HCL 4 MG/2ML IJ SOLN
4.0000 mg | INTRAMUSCULAR | Status: DC | PRN
Start: 2023-09-24 — End: 2023-09-25

## 2023-09-24 MED ORDER — OXYCODONE HCL 5 MG PO TABS: 5.0000 mg | ORAL_TABLET | ORAL | Status: DC | PRN

## 2023-09-24 MED ORDER — CEFAZOLIN SODIUM-DEXTROSE 1-4 GM/50ML-% IV SOLN
1.0000 g | Freq: Three times a day (TID) | INTRAVENOUS | Status: AC
  Administered 2023-09-24 – 2023-09-25 (×2): 1 g via INTRAVENOUS
  Filled 2023-09-24 (×4): qty 50

## 2023-09-24 MED ORDER — HYDROMORPHONE HCL 1 MG/ML IJ SOLN: 0.5000 mg | INTRAMUSCULAR | Status: DC | PRN

## 2023-09-24 MED ORDER — CEFAZOLIN SODIUM-DEXTROSE 2-4 GM/100ML-% IV SOLN
2.0000 g | INTRAVENOUS | Status: AC
  Administered 2023-09-24: 2 g via INTRAVENOUS

## 2023-09-24 MED ORDER — DEXAMETHASONE SODIUM PHOSPHATE 10 MG/ML IJ SOLN
INTRAMUSCULAR | Status: DC | PRN
  Administered 2023-09-24: 5 mg via INTRAVENOUS

## 2023-09-24 MED ORDER — ONDANSETRON HCL 4 MG/2ML IJ SOLN
INTRAMUSCULAR | Status: AC
  Filled 2023-09-24: qty 2

## 2023-09-24 MED ORDER — ROSUVASTATIN CALCIUM 10 MG PO TABS
10.0000 mg | ORAL_TABLET | Freq: Every day | ORAL | Status: DC
  Administered 2023-09-25: 10 mg via ORAL
  Filled 2023-09-24: qty 1

## 2023-09-24 MED ORDER — SODIUM CHLORIDE 0.9 % IR SOLN
3000.0000 mL | Status: DC
  Administered 2023-09-24: 3000 mL

## 2023-09-24 MED ORDER — POLYETHYLENE GLYCOL 3350 17 G PO PACK: 17.0000 g | PACK | Freq: Every day | ORAL | Status: DC | PRN

## 2023-09-24 MED ORDER — PROPOFOL 10 MG/ML IV BOLUS
INTRAVENOUS | Status: AC
  Filled 2023-09-24: qty 20

## 2023-09-24 MED ORDER — PANTOPRAZOLE SODIUM 40 MG PO TBEC
40.0000 mg | DELAYED_RELEASE_TABLET | Freq: Two times a day (BID) | ORAL | Status: DC
  Administered 2023-09-24 – 2023-09-25 (×2): 40 mg via ORAL
  Filled 2023-09-24 (×2): qty 1

## 2023-09-24 MED ORDER — SODIUM CHLORIDE 0.9 % IR SOLN
Status: DC | PRN
  Administered 2023-09-24 (×6): 3000 mL

## 2023-09-24 MED ORDER — FENTANYL CITRATE (PF) 100 MCG/2ML IJ SOLN
INTRAMUSCULAR | Status: DC | PRN
  Administered 2023-09-24: 50 ug via INTRAVENOUS
  Administered 2023-09-24 (×2): 25 ug via INTRAVENOUS

## 2023-09-24 MED ORDER — ORAL CARE MOUTH RINSE: 15.0000 mL | Freq: Once | OROMUCOSAL | Status: AC

## 2023-09-24 MED ORDER — SODIUM CHLORIDE 0.9% FLUSH: 3.0000 mL | Freq: Two times a day (BID) | INTRAVENOUS | Status: DC

## 2023-09-24 MED ORDER — OXYBUTYNIN CHLORIDE 5 MG PO TABS: 5.0000 mg | ORAL_TABLET | Freq: Three times a day (TID) | ORAL | Status: DC | PRN

## 2023-09-24 MED ORDER — DIPHENHYDRAMINE HCL 12.5 MG/5ML PO ELIX: 12.5000 mg | ORAL_SOLUTION | Freq: Four times a day (QID) | ORAL | Status: DC | PRN

## 2023-09-24 MED ORDER — PROPOFOL 10 MG/ML IV BOLUS
INTRAVENOUS | Status: DC | PRN
  Administered 2023-09-24: 150 mg via INTRAVENOUS

## 2023-09-24 MED ORDER — DEXTROSE-SODIUM CHLORIDE 5-0.45 % IV SOLN: INTRAVENOUS | Status: DC

## 2023-09-24 MED ORDER — METOPROLOL TARTRATE 25 MG PO TABS
25.0000 mg | ORAL_TABLET | Freq: Two times a day (BID) | ORAL | Status: DC
Start: 2023-09-24 — End: 2023-09-25
  Administered 2023-09-24 – 2023-09-25 (×2): 25 mg via ORAL
  Filled 2023-09-24 (×2): qty 1

## 2023-09-24 MED ORDER — CHLORHEXIDINE GLUCONATE 0.12 % MT SOLN
15.0000 mL | Freq: Once | OROMUCOSAL | Status: AC
Start: 2023-09-24 — End: 2023-09-24
  Administered 2023-09-24: 15 mL via OROMUCOSAL

## 2023-09-24 MED ORDER — CYCLOBENZAPRINE HCL 10 MG PO TABS: 5.0000 mg | ORAL_TABLET | Freq: Three times a day (TID) | ORAL | Status: DC | PRN

## 2023-09-24 MED ORDER — MIDAZOLAM HCL 2 MG/2ML IJ SOLN
INTRAMUSCULAR | Status: DC | PRN
  Administered 2023-09-24: 1 mg via INTRAVENOUS

## 2023-09-24 MED ORDER — ASPIRIN 81 MG PO TBEC
81.0000 mg | DELAYED_RELEASE_TABLET | Freq: Every day | ORAL | Status: DC
  Administered 2023-09-25: 81 mg via ORAL
  Filled 2023-09-24: qty 1

## 2023-09-24 SURGICAL SUPPLY — 24 items
BAG DRAIN URO TABLE W/ADPT NS (BAG) ×1 IMPLANT
BAG HAMPER (MISCELLANEOUS) ×1 IMPLANT
BAG URINE DRAIN TURP 4L (OSTOMY) ×1 IMPLANT
CATH FOLEY 3WAY 30CC 22F (CATHETERS) ×1 IMPLANT
CLOTH BEACON ORANGE TIMEOUT ST (SAFETY) ×1 IMPLANT
ELECT REM PT RETURN 9FT ADLT (ELECTROSURGICAL) ×1
ELECTRODE REM PT RTRN 9FT ADLT (ELECTROSURGICAL) ×1 IMPLANT
GLOVE BIO SURGEON STRL SZ7 (GLOVE) IMPLANT
GLOVE BIO SURGEON STRL SZ8 (GLOVE) ×1 IMPLANT
GLOVE BIOGEL PI IND STRL 7.0 (GLOVE) ×2 IMPLANT
GOWN STRL REUS W/TWL LRG LVL3 (GOWN DISPOSABLE) ×2 IMPLANT
GOWN STRL REUS W/TWL XL LVL3 (GOWN DISPOSABLE) ×1 IMPLANT
IV NS IRRIG 3000ML ARTHROMATIC (IV SOLUTION) ×4 IMPLANT
KIT TURNOVER CYSTO (KITS) ×1 IMPLANT
LOOP CUT BIPOLAR 24F LRG (ELECTROSURGICAL) ×1 IMPLANT
MANIFOLD NEPTUNE II (INSTRUMENTS) ×1 IMPLANT
PACK CYSTO (CUSTOM PROCEDURE TRAY) ×1 IMPLANT
PAD ARMBOARD 7.5X6 YLW CONV (MISCELLANEOUS) ×1 IMPLANT
POSITIONER HEAD 8X9X4 ADT (SOFTGOODS) ×1 IMPLANT
SYR 30ML LL (SYRINGE) ×1 IMPLANT
SYR TOOMEY IRRIG 70ML (MISCELLANEOUS) ×1
SYRINGE TOOMEY IRRIG 70ML (MISCELLANEOUS) ×1 IMPLANT
TOWEL OR 17X26 4PK STRL BLUE (TOWEL DISPOSABLE) ×1 IMPLANT
WATER STERILE IRR 500ML POUR (IV SOLUTION) ×1 IMPLANT

## 2023-09-24 NOTE — Anesthesia Preprocedure Evaluation (Addendum)
Anesthesia Evaluation  Patient identified by MRN, date of birth, ID band Patient awake    Reviewed: Allergy & Precautions, H&P , NPO status , Patient's Chart, lab work & pertinent test results, reviewed documented beta blocker date and time   History of Anesthesia Complications (+) history of anesthetic complications  Airway Mallampati: II  TM Distance: >3 FB Neck ROM: full    Dental  (+) Dental Advisory Given, Edentulous Upper, Missing,    Pulmonary shortness of breath, pneumonia, unresolved, former smoker   Pulmonary exam normal breath sounds clear to auscultation       Cardiovascular Exercise Tolerance: Good hypertension, + DOE  Normal cardiovascular exam Rhythm:regular Rate:Normal     Neuro/Psych negative neurological ROS  negative psych ROS   GI/Hepatic Neg liver ROS,GERD  ,,Rectal cancer   Endo/Other  negative endocrine ROS    Renal/GU Renal diseasenegative Renal ROS  negative genitourinary   Musculoskeletal  (+) Arthritis ,    Abdominal Normal abdominal exam  (+)   Peds  Hematology negative hematology ROS (+)   Anesthesia Other Findings   Reproductive/Obstetrics negative OB ROS                             Anesthesia Physical Anesthesia Plan  ASA: 2  Anesthesia Plan: General   Post-op Pain Management: Dilaudid IV   Induction: Intravenous  PONV Risk Score and Plan: Ondansetron, Dexamethasone and Midazolam  Airway Management Planned: LMA  Additional Equipment: None  Intra-op Plan:   Post-operative Plan: Extubation in OR  Informed Consent: I have reviewed the patients History and Physical, chart, labs and discussed the procedure including the risks, benefits and alternatives for the proposed anesthesia with the patient or authorized representative who has indicated his/her understanding and acceptance.     Dental advisory given  Plan Discussed with:  Anesthesiologist  Anesthesia Plan Comments:        Anesthesia Quick Evaluation

## 2023-09-24 NOTE — Anesthesia Postprocedure Evaluation (Signed)
Anesthesia Post Note  Patient: [name]  Procedure(s) Performed: CYSTOSCOPY (Bladder) TRANSURETHRAL RESECTION OF THE PROSTATE (TURP) (Bladder)  Patient location during evaluation: PACU Anesthesia Type: General Level of consciousness: awake and alert Pain management: pain level controlled Vital Signs Assessment: post-procedure vital signs reviewed and stable Respiratory status: spontaneous breathing, nonlabored ventilation, respiratory function stable and patient connected to nasal cannula oxygen Cardiovascular status: blood pressure returned to baseline and stable Postop Assessment: no apparent nausea or vomiting Anesthetic complications: no   There were no known notable events for this encounter.   Last Vitals:  Vitals:   09/24/23 1345 09/24/23 1355  BP: 125/83   Pulse: 61   Resp: 14   Temp:  36.6 C  SpO2: 100%     Last Pain:  Vitals:   09/24/23 1345  TempSrc:   PainSc: 0-No pain                 Hannibal Skalla L Lemarcus Baggerly

## 2023-09-24 NOTE — TOC CM/SW Note (Signed)
Transition of Care [facility]) - Inpatient Brief Assessment   Patient Details  Name: NIHAR KLUS MRN: [id number] Date of Birth: [birth year]  Transition of Care [facility]) CM/SW Contact:    Isabella Bowens, LCSWA Phone Number: 09/24/2023, 4:11 PM   Clinical Narrative: Patient is under OBS status. According to MD notes, pt should be up for DC tomorrow if urine is clear.   Transition of Care Department [facility]) has reviewed patient and no TOC needs have been identified at this time. We will continue to monitor patient advancement through interdisciplinary progression rounds. If new patient transition needs arise, please place a TOC consult.   Transition of Care Asessment: Insurance and Status: Insurance coverage has been reviewed Patient has primary care physician: Yes Home environment has been reviewed: Single Family Home Prior level of function:: Independent Prior/Current Home Services: No current home services Social Drivers of Health Review: SDOH reviewed no interventions necessary Readmission risk has been reviewed: Yes Transition of care needs: no transition of care needs at this time

## 2023-09-24 NOTE — Anesthesia Procedure Notes (Signed)
Procedure Name: LMA Insertion Date/Time: 09/24/2023 11:45 AM  Performed by: Lorin Glass, CRNAPre-anesthesia Checklist: Patient identified, Emergency Drugs available, Patient being monitored and Suction available Patient Re-evaluated:Patient Re-evaluated prior to induction Oxygen Delivery Method: Circle system utilized Preoxygenation: Pre-oxygenation with 100% oxygen Induction Type: IV induction LMA: LMA inserted LMA Size: 5.0 Placement Confirmation: positive ETCO2 Tube secured with: Tape Dental Injury: Teeth and Oropharynx as per pre-operative assessment

## 2023-09-24 NOTE — Op Note (Signed)
Preoperative diagnosis: BPH  Postoperative diagnosis: BPH  Procedure: 1 cystoscopy 2. Transurethral resection of the prostate  Attending: Wilkie Aye  Anesthesia: General  Estimated blood loss: Minimal  Drains: 22 French foley  Specimens: 1. Prostate Chips  Antibiotics: ancef  Findings: Trilobar prostate enlargement. Calcifications in the prostatic urethra. Ureteral orifices in normal anatomic location.   Indications: Patient is a 68 year old male with a history of BPH and severe urinary frequency and urgency.  After discussing treatment options, they decided proceed with transurethral resection of the prostate.  Procedure in detail: The patient was brought to the operating room and a brief timeout was done to ensure correct patient, correct procedure, correct site.  General anesthesia was administered patient was placed in dorsal lithotomy position.  Their genitalia was then prepped and draped in usual sterile fashion.  A rigid 22 French cystoscope was passed in the urethra and the bladder.  Bladder was inspected and we noted no masses or lesions.  the ureteral orifices were in the normal orthotopic locations. removed the cystoscope and placed a resectoscope into the bladder. We then turned our attention to the prostate resection. Using the bipolar resectoscope we resected the median lobe first from the bladder neck to the verumontanum. We then started at the 12 oclock position on the left lobe and resection to the 6 o'clock position from the bladder neck to the verumontanum. We then did the same resection of the right lobe. Once the resection was complete we then cauterized individual bleeders. We then removed the prostate chips and sent them for pathology.  We then re-inspected the prostatic fossa and found no residual bleeding.  the bladder was then drained, a 22 French foley was placed and this concluded the procedure which was well tolerated by patient.  Complications:  None  Condition: Stable, extubated, transferred to PACU  Plan: Patient is admitted overnight with continuous bladder irrigation. If their urine is clear tomorrow they will be discharged home and followup in 5 days for foley catheter removal and pathology discussion.

## 2023-09-24 NOTE — H&P (Signed)
Urology Admission H&P  Chief Complaint: difficulty urinating  History of Present Illness: Mr Ohms is a 68yo here for TURP for persistent LUTS after Thulium laser prostate resection. His urine stream continues to be weak. He has straining to urinate. No other complaints today  Past Medical History:  Diagnosis Date   Arthritis    hands and shoulders (left shoulder is worse than right)   Aspiration pneumonia (HCC) 03/24/2014   after surgery for colostomy   BPH (benign prostatic hyperplasia)    Colostomy in place [facility])    Complication of anesthesia 03/24/2014   aspiration pneumonia after surgery for colostomy, no problem with  surgeries since then   Difficulty urinating    Dyspnea    @ times due to hernias, seeing [facility] for  surgical consultation 04-20-2022, can lie flat   GERD (gastroesophageal reflux disease)    History of kidney stones    Hypertension    Ileostomy present (HCC)    Incisional hernia    right abdomen   Parastomal hernia left    Rectal cancer, uT2uN0, s/p LAR/ileostomy 03/20/2014 colostomy Aug2015 12/25/2013   TCS MAY 2015    Restless leg    Right inguinal hernia    Shortening, leg, congenital    Left   Skin irritation    Surrounding ostomy site   Past Surgical History:  Procedure Laterality Date   COLON RESECTION N/A 03/24/2014   Procedure: Diagnostic  laparotomy, takedown of coloanal anastamosis, end colostomy, with wound vac;  Surgeon: Ardeth Sportsman, MD;  Location: [facility];  Service: General;  Laterality: N/A;   COLONOSCOPY N/A 12/22/2013   SLF: 1. Normal mucosa in the terminal ilium. 2. Moderate diverticulosis in the transverse colon, descending colon, and sigmoid colon. 3. Rectal bleeding due ot rectal mass and hemorrhoids 4. Moderate sized internal hemorrhoids.    COLONOSCOPY N/A 01/19/2015   Procedure: COLONOSCOPY;  Surgeon: West Bali, MD;  [facility]: [facility];  Service: Endoscopy;  Laterality: N/A;  1215   COLONOSCOPY N/A 03/22/2018    Procedure: COLONOSCOPY;  Surgeon: West Bali, MD;  [facility];  Service: Endoscopy;  Laterality: N/A;  2:15pm   COLONOSCOPY WITH PROPOFOL N/A 05/28/2023   Procedure: COLONOSCOPY WITH PROPOFOL;  Surgeon: Lanelle Bal, DO;  [facility];  Service: Endoscopy;  Laterality: N/A;  1:00 pm, asa 3   COLOSTOMY     03-24-2014   ESOPHAGOGASTRODUODENOSCOPY N/A 12/22/2013   SLF: Probable Candia esophagitis2. Probable Barretts esphagus 3. Mild gastritis.    EUS N/A 01/01/2014   Procedure: LOWER ENDOSCOPIC ULTRASOUND (EUS);  Surgeon: Rachael Fee, MD;  Location: [facility];  Service: Endoscopy;  Laterality: N/A;   EXAMINATION UNDER ANESTHESIA  08/25/2014   Procedure: EXAM UNDER ANESTHESIA;  Surgeon: Karie Soda, MD;  Location: [facility];  Service: General;;   EXTRACORPOREAL SHOCK WAVE LITHOTRIPSY Right 07/25/2022   Procedure: EXTRACORPOREAL SHOCK WAVE LITHOTRIPSY (ESWL);  Surgeon: Malen Gauze, MD;  Location: [facility];  Service: Urology;  Laterality: Right;   eye reattachment and ear reattachment  Right 08/21/1969   FLEXIBLE SIGMOIDOSCOPY N/A 03/24/2014   Procedure: FLEXIBLE SIGMOIDOSCOPY;  Surgeon: Ardeth Sportsman, MD;  [facility]: [facility];  Service: General;  Laterality: N/A;   HIP FRACTURE SURGERY Left age 37 on 04-06-1970   ILEOSTOMY     ILEOSTOMY CLOSURE N/A 08/25/2014   Procedure: TAKEDOWN OF LOOP ILEOSTOMY ;  Surgeon: Karie Soda, MD;  Location: [facility];  Service: General;  Laterality: N/A;  INSERTION OF MESH N/A 04/11/2017   Procedure: INSERTION OF MESH ;  Surgeon: Karie Soda, MD;  [facility]: [facility];  Service: General;  Laterality: N/A;  BILATERAL TAP BLOCK   POLYPECTOMY  05/28/2023   Procedure: POLYPECTOMY;  Surgeon: Lanelle Bal, DO;  [facility];  Service: Endoscopy;;   THULIUM LASER TURP (TRANSURETHRAL RESECTION OF PROSTATE) N/A 04/04/2022   Procedure: THULIUM LASER TURP (TRANSURETHRAL RESECTION OF PROSTATE);  Surgeon: Jerilee Field, MD;   Location: [facility];  Service: Urology;  Laterality: N/A;   TOTAL HIP ARTHROPLASTY Left 11/19/2015   Procedure: LEFT TOTAL HIP ARTHROPLASTY ANTERIOR APPROACH;  Surgeon: Kathryne Hitch, MD;  [facility];  Service: Orthopedics;  Laterality: Left;   VENTRAL HERNIA REPAIR N/A 04/11/2017   Procedure: LAPAROSCOPIC INCARCERATED PERISTOMAL AND INCARCERATED INCISIONAL HERNIA X2;  Surgeon: Karie Soda, MD;  [facility]: [facility];  Service: General;  Laterality: N/A;  BILATERAL TAP BLOCK    Home Medications:  Current Facility-Administered Medications  Medication Dose Route Frequency Provider Last Rate Last Admin   ceFAZolin (ANCEF) 2-4 GM/100ML-% IVPB            ceFAZolin (ANCEF) IVPB 2g/100 mL premix  2 g Intravenous 30 min Pre-Op Jarek Longton, Mardene Celeste, MD       lactated ringers infusion   Intravenous Continuous Ewell, Charles, MD       sodium chloride flush (NS) 0.9 % injection 3-10 mL  3-10 mL Intravenous Q12H Ewell, Charles, MD       sodium chloride flush (NS) 0.9 % injection 3-10 mL  3-10 mL Intravenous PRN Ronelle Nigh, MD       Allergies: No Known Allergies  Family History  Problem Relation Age of Onset   Colon cancer Sister    Emphysema Mother    Heart attack Brother    Colon polyps Neg Hx    Social History:  reports that he quit smoking about 13 years ago. His smoking use included cigars. He has never used smokeless tobacco. He reports that he does not drink alcohol and does not use drugs.  Review of Systems  Genitourinary:  Positive for difficulty urinating.  All other systems reviewed and are negative.   Physical Exam:  Vital signs in last 24 hours: Temp:  [98 F (36.7 C)] 98 F (36.7 C) (02/03 1033) Pulse Rate:  [52] 52 (02/03 1033) Resp:  [13] 13 (02/03 1033) BP: (119)/(80) 119/80 (02/03 1033) SpO2:  [97 %] 97 % (02/03 1033) Weight:  [82.6 kg] 82.6 kg (02/03 1033) Physical Exam Constitutional:      Appearance: Normal appearance.  HENT:      Head: Normocephalic and atraumatic.     Mouth/Throat:     Mouth: Mucous membranes are dry.  Eyes:     Extraocular Movements: Extraocular movements intact.     Pupils: Pupils are equal, round, and reactive to light.  Cardiovascular:     Rate and Rhythm: Normal rate and regular rhythm.  Pulmonary:     Effort: Pulmonary effort is normal. No respiratory distress.  Abdominal:     General: Abdomen is flat. There is no distension.  Musculoskeletal:        General: No swelling. Normal range of motion.     Cervical back: Normal range of motion and neck supple.  Skin:    General: Skin is warm and dry.  Neurological:     General: No focal deficit present.     Mental Status: He is alert and oriented to  person, place, and time.  Psychiatric:        Mood and Affect: Mood normal.        Behavior: Behavior normal.        Thought Content: Thought content normal.        Judgment: Judgment normal.     Laboratory Data:  No results found for this or any previous visit (from the past 24 hours). No results found for this or any previous visit (from the past 240 hours). Creatinine: Recent Labs    09/20/23 1419  CREATININE 0.86   Baseline Creatinine: 0.86  Impression/Assessment:  68yo with BPH with a weak urinary stream  Plan:  We discussed the management of his BPH including continued medical therapy, Rezum, Urolift, TURP and simple prostatectomy. After discussing the options the patient has elected to proceed with TURP. Risks/benefits/alternatives discussed.   Wilkie Aye 09/24/2023, 11:32 AM

## 2023-09-24 NOTE — Progress Notes (Signed)
 Name: [name] DOB: 08-Jun-1956 MRN: [id number]  Diagnoses: Post-operative state  HPI: [name] presents post-operatively.  GU History: 1. BPH. - 04/04/2022: Underwent Thulium laser TURP by Dr. Derrick Fling. - Denies family history of prostate cancer. 2. Kidney stones. - 07/26/2022: Underwent right ESWL by Dr. Claretta Croft. 3. Erectile dysfunction. - He takes Tadalafil  5 mg daily and 20 mg PRN with good results.   Today He presents s/p the following procedures by Dr. Claretta Croft on 09/24/2023:  Preoperative diagnosis: BPH   Postoperative diagnosis: BPH   Procedure:  1. Cystoscopy 2. Transurethral resection of the prostate  Pathology:  A. PROSTATE CHIPS, TURP:  Benign prostatic hyperplasia   Postop course: Today He reports doing well.  He reports the catheter is draining well.  He denies acute flank pain, abdominal pain, fevers, nausea, or vomiting.   Fall Screening: Do you usually have a device to assist in your mobility? No   Medications: Current Outpatient Medications  Medication Sig Dispense Refill   amLODipine  (NORVASC ) 5 MG tablet Take 1 tablet (5 mg total) by mouth daily. 90 tablet 3   APPLE CIDER VINEGAR PO Take 1 tablet by mouth in the morning and at bedtime.     aspirin  EC 81 MG tablet Take 81 mg by mouth daily.     baclofen  (LIORESAL ) 10 MG tablet TAKE 1 TABLET (10 MG TOTAL) BY MOUTH AT BEDTIME AS NEEDED FOR MUSCLE SPASMS. FOR LEG CRAMPING 90 tablet 0   Beta Carotene (VITAMIN A) 25000 UNIT capsule Take 25,000 Units by mouth daily.     chlorthalidone  (HYGROTON ) 25 MG tablet Take 0.5 tablets (12.5 mg total) by mouth daily. 45 tablet 3   co-enzyme Q-10 30 MG capsule Take 30 mg by mouth daily.     cyclobenzaprine  (FLEXERIL ) 5 MG tablet Take 1 tablet (5 mg total) by mouth 3 (three) times daily as needed for muscle spasms. 30 tablet 0   metoprolol  tartrate (LOPRESSOR ) 25 MG tablet Take 1 tablet (25 mg total) by mouth 2 (two) times daily. 180 tablet 3    Multiple Vitamins-Minerals (PRESERVISION AREDS 2 PO) Take 1 capsule by mouth in the morning and at bedtime.     oxyCODONE -acetaminophen  (PERCOCET) 5-325 MG tablet Take 1 tablet by mouth every 4 (four) hours as needed for severe pain (pain score 7-10). 15 tablet 0   pantoprazole  (PROTONIX ) 40 MG tablet Take 1 tablet (40 mg total) by mouth 2 (two) times daily before a meal. 180 tablet 1   rosuvastatin  (CRESTOR ) 10 MG tablet TAKE 1 TABLET BY MOUTH EVERY DAY 90 tablet 0   tadalafil  (CIALIS ) 20 MG tablet Take 1 tablet (20 mg total) by mouth daily as needed. 10 tablet 5   tadalafil  (CIALIS ) 5 MG tablet Take 1 tablet (5 mg total) by mouth daily. 30 tablet 11   Turmeric 500 MG CAPS Take 500 mg by mouth 2 (two) times daily.      vitamin B-12 (CYANOCOBALAMIN ) 500 MCG tablet Take 1,000 mcg by mouth daily.     vitamin C  (ASCORBIC ACID ) 500 MG tablet Take 500 mg by mouth 2 (two) times daily.     No current facility-administered medications for this visit.    Allergies: No Known Allergies  Past Medical History:  Diagnosis Date   Arthritis    hands and shoulders (left shoulder is worse than right)   Aspiration pneumonia (HCC) 03/24/2014   after surgery for colostomy   BPH (benign prostatic hyperplasia)    Colostomy in place (  HCC)    Complication of anesthesia 03/24/2014   aspiration pneumonia after surgery for colostomy, no problem with  surgeries since then   Difficulty urinating    Dyspnea    @ times due to hernias, seeing [facility] for  surgical consultation 04-20-2022, can lie flat   GERD (gastroesophageal reflux disease)    History of kidney stones    Hypertension    Ileostomy present (HCC)    Incisional hernia    right abdomen   Parastomal hernia left    Rectal cancer, uT2uN0, s/p LAR/ileostomy 03/20/2014 colostomy Aug2015 12/25/2013   TCS MAY 2015    Restless leg    Right inguinal hernia    Shortening, leg, congenital    Left   Skin irritation    Surrounding ostomy site   Past  Surgical History:  Procedure Laterality Date   COLON RESECTION N/A 03/24/2014   Procedure: Diagnostic  laparotomy, takedown of coloanal anastamosis, end colostomy, with wound vac;  Surgeon: Eddye Goodie, MD;  Location: [facility];  Service: General;  Laterality: N/A;   COLONOSCOPY N/A 12/22/2013   SLF: 1. Normal mucosa in the terminal ilium. 2. Moderate diverticulosis in the transverse colon, descending colon, and sigmoid colon. 3. Rectal bleeding due ot rectal mass and hemorrhoids 4. Moderate sized internal hemorrhoids.    COLONOSCOPY N/A 01/19/2015   Procedure: COLONOSCOPY;  Surgeon: Alyce Jubilee, MD;  Location: [facility];  Service: Endoscopy;  Laterality: N/A;  1215   COLONOSCOPY N/A 03/22/2018   Procedure: COLONOSCOPY;  Surgeon: Alyce Jubilee, MD;  [facility];  Service: Endoscopy;  Laterality: N/A;  2:15pm   COLONOSCOPY WITH PROPOFOL  N/A 05/28/2023   Procedure: COLONOSCOPY WITH PROPOFOL ;  Surgeon: Vinetta Greening, DO;  [facility]: [facility];  Service: Endoscopy;  Laterality: N/A;  1:00 pm, asa 3   COLOSTOMY     03-24-2014   CYSTOSCOPY N/A 09/24/2023   Procedure: CYSTOSCOPY;  Surgeon: Marco Severs, MD;  Location: [facility];  Service: Urology;  Laterality: N/A;   ESOPHAGOGASTRODUODENOSCOPY N/A 12/22/2013   SLF: Probable Candia esophagitis2. Probable Barretts esphagus 3. Mild gastritis.    EUS N/A 01/01/2014   Procedure: LOWER ENDOSCOPIC ULTRASOUND (EUS);  Surgeon: Janel Medford, MD;  Location: [facility];  Service: Endoscopy;  Laterality: N/A;   EXAMINATION UNDER ANESTHESIA  08/25/2014   Procedure: EXAM UNDER ANESTHESIA;  Surgeon: Candyce Champagne, MD;  Location: [facility];  Service: General;;   EXTRACORPOREAL SHOCK WAVE LITHOTRIPSY Right 07/25/2022   Procedure: EXTRACORPOREAL SHOCK WAVE LITHOTRIPSY (ESWL);  Surgeon: Marco Severs, MD;  Location: [facility];  Service: Urology;  Laterality: Right;   eye reattachment and ear reattachment  Right 08/21/1969   FLEXIBLE  SIGMOIDOSCOPY N/A 03/24/2014   Procedure: FLEXIBLE SIGMOIDOSCOPY;  Surgeon: Eddye Goodie, MD;  Location: [facility];  Service: General;  Laterality: N/A;   HIP FRACTURE SURGERY Left age 56 on 04-06-1970   ILEOSTOMY     ILEOSTOMY CLOSURE N/A 08/25/2014   Procedure: TAKEDOWN OF LOOP ILEOSTOMY ;  Surgeon: Candyce Champagne, MD;  Location: [facility];  Service: General;  Laterality: N/A;   INSERTION OF MESH N/A 04/11/2017   Procedure: INSERTION OF MESH ;  Surgeon: Candyce Champagne, MD;  Location: [facility];  Service: General;  Laterality: N/A;  BILATERAL TAP BLOCK   POLYPECTOMY  05/28/2023   Procedure: POLYPECTOMY;  Surgeon: Vinetta Greening, DO;  [facility];  Service: Endoscopy;;   THULIUM LASER TURP (TRANSURETHRAL RESECTION OF PROSTATE) N/A 04/04/2022  Procedure: THULIUM LASER TURP (TRANSURETHRAL RESECTION OF PROSTATE);  Surgeon: Christina Coyer, MD;  Location[facility];  Service: Urology;  Laterality: N/A;   TOTAL HIP ARTHROPLASTY Left 11/19/2015   Procedure: LEFT TOTAL HIP ARTHROPLASTY ANTERIOR APPROACH;  Surgeon: Arnie Lao, MD;  Location: [facility];  Service: Orthopedics;  Laterality: Left;   TRANSURETHRAL RESECTION OF PROSTATE N/A 09/24/2023   Procedure: TRANSURETHRAL RESECTION OF THE PROSTATE (TURP);  Surgeon: Marco Severs, MD;  Location: [facility];  Service: Urology;  Laterality: N/A;   VENTRAL HERNIA REPAIR N/A 04/11/2017   Procedure: LAPAROSCOPIC INCARCERATED PERISTOMAL AND INCARCERATED INCISIONAL HERNIA X2;  Surgeon: Candyce Champagne, MD;  Location: [facility];  Service: General;  Laterality: N/A;  BILATERAL TAP BLOCK   Family History  Problem Relation Age of Onset   Colon cancer Sister    Emphysema Mother    Heart attack Brother    Colon polyps Neg Hx    Social History   Socioeconomic History   Marital status: Married    Spouse name: Not on file   Number of children: Not on file   Years of education: Not on file   Highest education level: Not on file   Occupational History   Not on file  Tobacco Use   Smoking status: Former    Types: Cigars    Quit date: 2012    Years since quitting: 13.1   Smokeless tobacco: Never  Vaping Use   Vaping status: Never Used  Substance and Sexual Activity   Alcohol use: No    Comment: quit years ago   Drug use: No   Sexual activity: Not on file  Other Topics Concern   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   Social Drivers of Corporate investment banker Strain: Low Risk  (02/14/2022)   Overall Financial Resource Strain (CARDIA)    Difficulty of Paying Living Expenses: Not hard at all  Food Insecurity: No Food Insecurity (09/24/2023)   Hunger Vital Sign    Worried About Running Out of Food in the Last Year: Never true    Ran Out of Food in the Last Year: Never true  Transportation Needs: No Transportation Needs (09/24/2023)   PRAPARE - Administrator, Civil Service (Medical): No    Lack of Transportation (Non-Medical): No  Physical Activity: Sufficiently Active (02/14/2022)   Exercise Vital Sign    Days of Exercise per Week: 5 days    Minutes of Exercise per Session: 30 min  Stress: No Stress Concern Present (02/14/2022)   [employer] of Occupational Health - Occupational Stress Questionnaire    Feeling of Stress : Not at all  Social Connections: Socially Integrated (09/24/2023)   Social Connection and Isolation Panel [NHANES]    Frequency of Communication with Friends and Family: More than three times a week    Frequency of Social Gatherings with Friends and Family: Not on file    Attends Religious Services: More than 4 times per year    Active Member of [employer] or Organizations: Not on file    Attends Banker Meetings: More than 4 times per year    Marital Status: Married  Catering manager Violence: Not At Risk (09/24/2023)   Humiliation, Afraid, Rape, and Kick questionnaire    Fear of Current or Ex-Partner: No    Emotionally  Abused: No    Physically Abused: No    Sexually Abused: No  SUBJECTIVE  Review of Systems Constitutional: Patient denies any unintentional weight loss or change in strength lntegumentary: Patient denies any rashes or pruritus Cardiovascular: Patient denies chest pain or syncope Respiratory: Patient denies shortness of breath Gastrointestinal: Patient denies nausea, vomiting, constipation, or diarrhea Musculoskeletal: Patient denies muscle cramps or weakness Neurologic: Patient denies convulsions or seizures Allergic/Immunologic: Patient denies recent allergic reaction(s) Hematologic/Lymphatic: Patient denies bleeding tendencies Endocrine: Patient denies heat/cold intolerance  GU: As per HPI.  OBJECTIVE Vitals:   10/01/23 1034  BP: 130/82  Pulse: 66   There is no height or weight on file to calculate BMI.  Physical Examination Constitutional: No obvious distress; patient is non-toxic appearing  Cardiovascular: No visible lower extremity edema.  Respiratory: The patient does not have audible wheezing/stridor; respirations do not appear labored  Gastrointestinal: Abdomen non-distended Musculoskeletal: Normal ROM of UEs  Skin: No obvious rashes/open sores  Neurologic: CN 2-12 grossly intact Psychiatric: Answered questions appropriately with normal affect  Hematologic/Lymphatic/Immunologic: No obvious bruises or sites of spontaneous bleeding   ASSESSMENT Benign prostatic hyperplasia with weak urinary stream - Plan: Bladder Voiding Trial  Postop check - Plan: Bladder Voiding Trial  S/P TURP - Plan: Bladder Voiding Trial  Kidney stones - Plan: DG Abd 1 View  We reviewed the operative procedures and findings.  Passed voiding trial. Foley catheter to remain out.  Will plan for follow up in 3 months with KUB for stone surveillance or sooner if needed. Pt verbalized understanding and agreement. All questions were answered.  PLAN Advised the following: Foley catheter  discontinued. OK to discontinue / not restart Tadalafil  5 mg daily if voiding well.  He will consider whether or not to try Tadalafil  20 mg PRN again in a few months for ED. Return in about 3 months (around 12/29/2023) for KUB, PVR, UA, & f/u with Griselda Lederer NP.   Orders Placed This Encounter  Procedures   DG Abd 1 View    Standing Status:   Future    Expected Date:   12/29/2023    Expiration Date:   09/30/2024    Reason for Exam (SYMPTOM  OR DIAGNOSIS REQUIRED):   kidney stone    Preferred imaging location?:   [facility]   Bladder Voiding Trial    It has been explained that the patient is to follow regularly with their PCP in addition to all other providers involved in their care and to follow instructions provided by these respective offices. Patient advised to contact urology clinic if any urologic-pertaining questions, concerns, new symptoms or problems arise in the interim period.  There are no Patient Instructions on file for this visit.  Electronically signed by:  Lauretta Ponto, MSN, FNP-C, CUNP 10/01/2023 11:20 AM

## 2023-09-24 NOTE — Transfer of Care (Signed)
Immediate Anesthesia Transfer of Care Note  Patient: [name]  Procedure(s) Performed: CYSTOSCOPY (Bladder) TRANSURETHRAL RESECTION OF THE PROSTATE (TURP) (Bladder)  Patient Location: PACU  Anesthesia Type:General  Level of Consciousness: awake, alert , and oriented  Airway & Oxygen Therapy: Patient Spontanous Breathing and Patient connected to nasal cannula oxygen  Post-op Assessment: Report given to RN, Post -op Vital signs reviewed and stable, Patient moving all extremities X 4, and Patient able to stick tongue midline  Post vital signs: Reviewed and stable  Last Vitals:  Vitals Value Taken Time  BP 152/83   Temp 98.8   Pulse 56 09/24/23 1247  Resp 12 09/24/23 1247  SpO2 100 % 09/24/23 1247  Vitals shown include unfiled device data.  Last Pain:  Vitals:   09/24/23 1033  TempSrc: Oral  PainSc: 3          Complications: No notable events documented.

## 2023-09-25 ENCOUNTER — Encounter (HOSPITAL_COMMUNITY): Payer: Self-pay | Admitting: Urology

## 2023-09-25 DIAGNOSIS — N401 Enlarged prostate with lower urinary tract symptoms: Secondary | ICD-10-CM | POA: Diagnosis not present

## 2023-09-25 LAB — SURGICAL PATHOLOGY

## 2023-09-25 LAB — BASIC METABOLIC PANEL
Anion gap: 10 (ref 5–15)
BUN: 17 mg/dL (ref 8–23)
CO2: 25 mmol/L (ref 22–32)
Calcium: 8.9 mg/dL (ref 8.9–10.3)
Chloride: 101 mmol/L (ref 98–111)
Creatinine, Ser: 0.82 mg/dL (ref 0.61–1.24)
GFR, Estimated: >60 mL/min (ref >60)
Glucose, Bld: 162 mg/dL — ABNORMAL HIGH (ref 70–99)
Potassium: 3.7 mmol/L (ref 3.5–5.1)
Sodium: 136 mmol/L (ref 135–145)

## 2023-09-25 LAB — CBC
HCT: 36 % — ABNORMAL LOW (ref 39.0–52.0)
Hemoglobin: 11.4 g/dL — ABNORMAL LOW (ref 13.0–17.0)
MCH: 27.4 pg (ref 26.0–34.0)
MCHC: 31.7 g/dL (ref 30.0–36.0)
MCV: 86.5 fL (ref 80.0–100.0)
Platelets: 373 10*3/uL (ref 150–400)
RBC: 4.16 MIL/uL — ABNORMAL LOW (ref 4.22–5.81)
RDW: 14.4 % (ref 11.5–15.5)
WBC: 13.5 10*3/uL — ABNORMAL HIGH (ref 4.0–10.5)
nRBC: 0 % (ref 0.0–0.2)

## 2023-09-25 MED ORDER — OXYCODONE-ACETAMINOPHEN 5-325 MG PO TABS: 1.0000 | ORAL_TABLET | ORAL | 0 refills | Status: DC | PRN

## 2023-09-25 NOTE — Progress Notes (Signed)
 Mobility Specialist Progress Note:    09/25/23 1000  Mobility  Activity Ambulated with assistance in hallway;Transferred from bed to chair  Level of Assistance Standby assist, set-up cues, supervision of patient - no hands on  Assistive Device Front wheel walker  Distance Ambulated (ft) 210 ft  Range of Motion/Exercises Active;All extremities  Activity Response Tolerated well  Mobility Referral Yes  Mobility visit 1 Mobility  Mobility Specialist Start Time (ACUTE ONLY) 1000  Mobility Specialist Stop Time (ACUTE ONLY) 1020  Mobility Specialist Time Calculation (min) (ACUTE ONLY) 20 min   Pt received in bed, agreeable to mobility. Required SBA to stand and ambulate with RW. Tolerated well,asx throughout. Returned to room, left pt in chair. Call bell in reach, all needs met  Rogelia Kluver Mobility Specialist Please contact via SecureChat or  Rehab office at [phone]

## 2023-09-25 NOTE — Progress Notes (Signed)
 Mobility Specialist Progress Note:    09/25/23 1215  Mobility  Activity Ambulated with assistance in hallway  Level of Assistance Standby assist, set-up cues, supervision of patient - no hands on  Assistive Device Front wheel walker  Distance Ambulated (ft) 220 ft  Range of Motion/Exercises Active;All extremities  Activity Response Tolerated well  Mobility Referral Yes  Mobility visit 1 Mobility  Mobility Specialist Start Time (ACUTE ONLY) 1215  Mobility Specialist Stop Time (ACUTE ONLY) 1235  Mobility Specialist Time Calculation (min) (ACUTE ONLY) 20 min   Pt received in chair, agreeable to mobility. Required supervision to stand and ambulate with RW. Tolerated well, asx throughout. Returned pt to chair, call bell in reach. All needs met.  Adalin Vanderploeg Mobility Specialist Please contact via Special Educational Needs Teacher or  Rehab office at [phone]

## 2023-09-25 NOTE — Care Management Obs Status (Signed)
MEDICARE OBSERVATION STATUS NOTIFICATION   Patient Details  Name: [name] MRN: [id number] Date of Birth: 09-Mar-1956   Medicare Observation Status Notification Given:  Yes    Beather Arbour 09/25/2023, 9:38 AM

## 2023-09-26 ENCOUNTER — Other Ambulatory Visit: Payer: Self-pay | Admitting: Urology

## 2023-10-01 ENCOUNTER — Encounter: Payer: Self-pay | Admitting: Urology

## 2023-10-01 ENCOUNTER — Ambulatory Visit (INDEPENDENT_AMBULATORY_CARE_PROVIDER_SITE_OTHER): Payer: No Typology Code available for payment source | Admitting: Urology

## 2023-10-01 VITALS — BP 130/82 | HR 66

## 2023-10-01 DIAGNOSIS — Z87442 Personal history of urinary calculi: Secondary | ICD-10-CM

## 2023-10-01 DIAGNOSIS — Z9079 Acquired absence of other genital organ(s): Secondary | ICD-10-CM

## 2023-10-01 DIAGNOSIS — N401 Enlarged prostate with lower urinary tract symptoms: Secondary | ICD-10-CM | POA: Diagnosis not present

## 2023-10-01 DIAGNOSIS — N200 Calculus of kidney: Secondary | ICD-10-CM

## 2023-10-01 DIAGNOSIS — R3912 Poor urinary stream: Secondary | ICD-10-CM

## 2023-10-01 DIAGNOSIS — Z09 Encounter for follow-up examination after completed treatment for conditions other than malignant neoplasm: Secondary | ICD-10-CM

## 2023-10-01 NOTE — Progress Notes (Signed)
 Fill and Pull Catheter Removal  Patient is present today for a catheter removal.  Patient was cleaned and prepped in a sterile fashion 125 ml of sterile water / saline was instilled into the bladder when the patient felt the urge to urinate. 30 ml of water  was then drained from the balloon.  A 22 FR foley cath was removed from the bladder no complications were noted .  Patient as then given some time to void on their own.  Patient can void  200 ml on their own after some time.  Patient tolerated well.  Performed by: Rolland Cline  Follow up/ Additional notes: N/A

## 2023-10-02 DIAGNOSIS — Z933 Colostomy status: Secondary | ICD-10-CM | POA: Diagnosis not present

## 2023-10-02 DIAGNOSIS — N401 Enlarged prostate with lower urinary tract symptoms: Secondary | ICD-10-CM | POA: Diagnosis not present

## 2023-10-02 DIAGNOSIS — Z008 Encounter for other general examination: Secondary | ICD-10-CM | POA: Diagnosis not present

## 2023-10-02 DIAGNOSIS — I10 Essential (primary) hypertension: Secondary | ICD-10-CM | POA: Diagnosis not present

## 2023-10-03 ENCOUNTER — Ambulatory Visit: Payer: No Typology Code available for payment source | Admitting: Family Medicine

## 2023-10-03 ENCOUNTER — Encounter: Payer: Self-pay | Admitting: Family Medicine

## 2023-10-03 VITALS — BP 130/82 | HR 69 | Temp 97.2°F | Ht 65.0 in | Wt 187.0 lb

## 2023-10-03 DIAGNOSIS — N400 Enlarged prostate without lower urinary tract symptoms: Secondary | ICD-10-CM

## 2023-10-03 DIAGNOSIS — I10 Essential (primary) hypertension: Secondary | ICD-10-CM | POA: Diagnosis not present

## 2023-10-03 MED ORDER — AMLODIPINE BESYLATE 5 MG PO TABS: 5.0000 mg | ORAL_TABLET | Freq: Every day | ORAL | 3 refills | Status: DC

## 2023-10-03 NOTE — Assessment & Plan Note (Signed)
Stable.  Continue current medications.

## 2023-10-03 NOTE — Progress Notes (Signed)
Subjective:  Patient ID: [name], male    DOB: 1956/02/06  Age: 68 y.o. MRN: [id number]  CC:   Chief Complaint  Patient presents with   Hospitalization Follow-up    Prostate surgery - still healing - some blood in urine and left abd tenderness    HPI:  68 year old male with the below mentioned medical problems presents for hospital follow-up.  Patient status post cystoscopy and TURP.  He states that overall he is doing well.  He is having some incontinence/leakage and some mild dysuria.  He is also having some blood in urine.  Has recent been seen by urology.  Is overall recovering well.  Patient denies any fever.  Hypertension is well-controlled on amlodipine, chlorthalidone, and metoprolol.  He notes that he is having some tightness at his ostomy but overall is doing well.  Patient Active Problem List   Diagnosis Date Noted   Kidney stones 01/23/2023   Erectile dysfunction 01/23/2023   Benign prostatic hyperplasia 01/23/2023   DOE (dyspnea on exertion) 10/10/2022   Hyperlipidemia 09/14/2021   Colostomy in place [facility])    Essential hypertension, benign 05/12/2014   Obesity (BMI 30-39.9) 03/24/2014   Rectal cancer, uT2uN0, s/p LAR/ileostomy 03/20/2014 colostomy Aug2015 12/25/2013   GERD (gastroesophageal reflux disease) 11/27/2013    Social Hx   Social History   Socioeconomic History   Marital status: Married    Spouse name: Not on file   Number of children: Not on file   Years of education: Not on file   Highest education level: Not on file  Occupational History   Not on file  Tobacco Use   Smoking status: Former    Types: Cigars    Quit date: 2012    Years since quitting: 13.1   Smokeless tobacco: Never  Vaping Use   Vaping status: Never Used  Substance and Sexual Activity   Alcohol use: No    Comment: quit years ago   Drug use: No   Sexual activity: Not on file  Other Topics Concern   Not on file  Social History Narrative   Raises donkeys  and cows. GROWS CORN.   Social Drivers of Corporate investment banker Strain: Low Risk  (02/14/2022)   Overall Financial Resource Strain (CARDIA)    Difficulty of Paying Living Expenses: Not hard at all  Food Insecurity: No Food Insecurity (09/24/2023)   Hunger Vital Sign    Worried About Running Out of Food in the Last Year: Never true    Ran Out of Food in the Last Year: Never true  Transportation Needs: No Transportation Needs (09/24/2023)   PRAPARE - Administrator, Civil Service (Medical): No    Lack of Transportation (Non-Medical): No  Physical Activity: Sufficiently Active (02/14/2022)   Exercise Vital Sign    Days of Exercise per Week: 5 days    Minutes of Exercise per Session: 30 min  Stress: No Stress Concern Present (02/14/2022)   [employer] of Occupational Health - Occupational Stress Questionnaire    Feeling of Stress : Not at all  Social Connections: Socially Integrated (09/24/2023)   Social Connection and Isolation Panel [NHANES]    Frequency of Communication with Friends and Family: More than three times a week    Frequency of Social Gatherings with Friends and Family: Not on file    Attends Religious Services: More than 4 times per year    Active Member of [employer] or Organizations: Not on file  Attends Banker Meetings: More than 4 times per year    Marital Status: Married    Review of Systems Per HPI  Objective:  BP 130/82   Pulse 69   Temp (!) 97.2 F (36.2 C)   Ht 5\' 5"  (1.651 m)   Wt 187 lb (84.8 kg)   SpO2 97%   BMI 31.12 kg/m      10/03/2023    9:52 AM 10/01/2023   10:34 AM 09/25/2023    4:09 AM  BP/Weight  Systolic BP 130 130 127  Diastolic BP 82 82 75  Wt. (Lbs) 187    BMI 31.12 kg/m2      Physical Exam Vitals and nursing note reviewed.  Constitutional:      General: He is not in acute distress.    Appearance: Normal appearance.  HENT:     Head: Normocephalic and atraumatic.  Cardiovascular:     Rate and  Rhythm: Normal rate and regular rhythm.  Pulmonary:     Effort: Pulmonary effort is normal.     Breath sounds: Normal breath sounds.  Neurological:     Mental Status: He is alert.  Psychiatric:        Mood and Affect: Mood normal.        Behavior: Behavior normal.     Lab Results  Component Value Date   WBC 13.5 (H) 09/25/2023   HGB 11.4 (L) 09/25/2023   HCT 36.0 (L) 09/25/2023   PLT 373 09/25/2023   GLUCOSE 162 (H) 09/25/2023   CHOL 140 04/10/2023   TRIG 114 04/10/2023   HDL 40 04/10/2023   LDLCALC 79 04/10/2023   ALT 18 04/10/2023   AST 15 04/10/2023   NA 136 09/25/2023   K 3.7 09/25/2023   CL 101 09/25/2023   CREATININE 0.82 09/25/2023   BUN 17 09/25/2023   CO2 25 09/25/2023   TSH 2.029 10/18/2018   INR 0.99 12/25/2013   HGBA1C 5.7 (H) 04/10/2023     Assessment & Plan:   Problem List Items Addressed This Visit       Cardiovascular and Mediastinum   Essential hypertension, benign (Chronic)   Stable.  Continue current medications.      Relevant Medications   amLODipine (NORVASC) 5 MG tablet     Genitourinary   Benign prostatic hyperplasia - Primary   Recovering as expected status post TURP.       Meds ordered this encounter  Medications   amLODipine (NORVASC) 5 MG tablet    Sig: Take 1 tablet (5 mg total) by mouth daily.    Dispense:  90 tablet    Refill:  3    Follow-up:  Return in about 6 months (around 04/01/2024).  Everlene Other DO [facility]

## 2023-10-03 NOTE — Assessment & Plan Note (Signed)
Recovering as expected status post TURP.

## 2023-10-03 NOTE — Patient Instructions (Addendum)
Continue your medications.    Follow up in 6 months

## 2023-10-04 NOTE — Discharge Summary (Signed)
 Physician Discharge Summary  Patient ID: [name] MRN: [id number] DOB/AGE: June 11, 1956 68 y.o.  Admit date: 09/24/2023 Discharge date: 09/25/2023  Admission Diagnoses:  <principal problem not specified>  Discharge Diagnoses:  Active Problems:   * No active hospital problems. *   Past Medical History:  Diagnosis Date   Arthritis    hands and shoulders (left shoulder is worse than right)   Aspiration pneumonia (HCC) 03/24/2014   after surgery for colostomy   BPH (benign prostatic hyperplasia)    Colostomy in place [facility])    Complication of anesthesia 03/24/2014   aspiration pneumonia after surgery for colostomy, no problem with  surgeries since then   Difficulty urinating    Dyspnea    @ times due to hernias, seeing [facility] for  surgical consultation 04-20-2022, can lie flat   GERD (gastroesophageal reflux disease)    History of kidney stones    Hypertension    Ileostomy present (HCC)    Incisional hernia    right abdomen   Parastomal hernia left    Rectal cancer, uT2uN0, s/p LAR/ileostomy 03/20/2014 colostomy Aug2015 12/25/2013   TCS MAY 2015    Restless leg    Right inguinal hernia    Shortening, leg, congenital    Left   Skin irritation    Surrounding ostomy site    Surgeries: Procedure(s): CYSTOSCOPY TRANSURETHRAL RESECTION OF THE PROSTATE (TURP) on 09/24/2023   Consultants (if any):   Discharged Condition: Improved  Hospital Course: [name] is an 68 y.o. male who was admitted 09/24/2023 with a diagnosis of <principal problem not specified> and went to the operating room on 09/24/2023 and underwent the above named procedures.    He was given perioperative antibiotics:  Anti-infectives (From admission, onward)    Start     Dose/Rate Route Frequency Ordered Stop   09/24/23 2000  ceFAZolin  (ANCEF ) IVPB 1 g/50 mL premix        1 g 100 mL/hr over 30 Minutes Intravenous Every 8 hours 09/24/23 1418 09/25/23 0614   09/24/23 1200  ceFAZolin  (ANCEF ) IVPB  2g/100 mL premix        2 g 200 mL/hr over 30 Minutes Intravenous 30 min pre-op 09/24/23 1013 09/24/23 1207   09/24/23 1013  ceFAZolin  (ANCEF ) 2-4 GM/100ML-% IVPB       Note to Pharmacy: Karon Nest B: cabinet override      09/24/23 1013 09/24/23 1147     .  He was given sequential compression devices, early ambulation for DVT prophylaxis.  He benefited maximally from the hospital stay and there were no complications.    Recent vital signs:  Vitals:   09/24/23 2053 09/25/23 0409  BP: 118/69 127/75  Pulse: 77 (!) 57  Resp: 16 17  Temp: 98.5 F (36.9 C) 97.7 F (36.5 C)  SpO2: 94% 95%    Recent laboratory studies:  Lab Results  Component Value Date   HGB 11.4 (L) 09/25/2023   HGB 11.8 (L) 09/24/2023   HGB 11.6 (L) 09/20/2023   Lab Results  Component Value Date   WBC 13.5 (H) 09/25/2023   PLT 373 09/25/2023   Lab Results  Component Value Date   INR 0.99 12/25/2013   Lab Results  Component Value Date   NA 136 09/25/2023   K 3.7 09/25/2023   CL 101 09/25/2023   CO2 25 09/25/2023   BUN 17 09/25/2023   CREATININE 0.82 09/25/2023   GLUCOSE 162 (H) 09/25/2023    Discharge Medications:   Allergies  as of 09/25/2023   No Known Allergies      Medication List     STOP taking these medications    tamsulosin  0.4 MG Caps capsule Commonly known as: FLOMAX        TAKE these medications    APPLE CIDER VINEGAR PO Take 1 tablet by mouth in the morning and at bedtime.   ascorbic acid  500 MG tablet Commonly known as: VITAMIN C  Take 500 mg by mouth 2 (two) times daily.   aspirin  EC 81 MG tablet Take 81 mg by mouth daily.   baclofen  10 MG tablet Commonly known as: LIORESAL  TAKE 1 TABLET (10 MG TOTAL) BY MOUTH AT BEDTIME AS NEEDED FOR MUSCLE SPASMS. FOR LEG CRAMPING   chlorthalidone  25 MG tablet Commonly known as: HYGROTON  Take 0.5 tablets (12.5 mg total) by mouth daily.   co-enzyme Q-10 30 MG capsule Take 30 mg by mouth daily.   cyanocobalamin  500  MCG tablet Commonly known as: VITAMIN B12 Take 1,000 mcg by mouth daily.   metoprolol  tartrate 25 MG tablet Commonly known as: LOPRESSOR  Take 1 tablet (25 mg total) by mouth 2 (two) times daily.   pantoprazole  40 MG tablet Commonly known as: PROTONIX  Take 1 tablet (40 mg total) by mouth 2 (two) times daily before a meal.   PRESERVISION AREDS 2 PO Take 1 capsule by mouth in the morning and at bedtime.   rosuvastatin  10 MG tablet Commonly known as: CRESTOR  TAKE 1 TABLET BY MOUTH EVERY DAY   tadalafil  5 MG tablet Commonly known as: CIALIS  Take 1 tablet (5 mg total) by mouth daily.   tadalafil  20 MG tablet Commonly known as: CIALIS  Take 1 tablet (20 mg total) by mouth daily as needed.   Turmeric 500 MG Caps Take 500 mg by mouth 2 (two) times daily.   vitamin A 25000 UNIT capsule Take 25,000 Units by mouth daily.        Diagnostic Studies: No results found.  Disposition: Discharge disposition: 01-Home or Self Care       Discharge Instructions     Discharge patient   Complete by: As directed    Discharge disposition: 01-Home or Self Care   Discharge patient date: 09/25/2023        Follow-up Information     Lillybeth Tal, Belvie CROME, MD. Call in 1 week(s).   Specialty: Urology Contact information: [address]  [place] [address] [phone]                  Signed: Belvie Clara 10/04/2023, 1:41 PM

## 2023-10-18 DIAGNOSIS — N410 Acute prostatitis: Secondary | ICD-10-CM | POA: Diagnosis not present

## 2023-10-18 DIAGNOSIS — Z008 Encounter for other general examination: Secondary | ICD-10-CM | POA: Diagnosis not present

## 2023-10-19 ENCOUNTER — Other Ambulatory Visit: Payer: Self-pay | Admitting: Family Medicine

## 2023-10-24 ENCOUNTER — Ambulatory Visit: Payer: No Typology Code available for payment source | Admitting: Urology

## 2023-10-24 VITALS — BP 132/77 | HR 77

## 2023-10-24 DIAGNOSIS — Z09 Encounter for follow-up examination after completed treatment for conditions other than malignant neoplasm: Secondary | ICD-10-CM | POA: Diagnosis not present

## 2023-10-24 DIAGNOSIS — N3001 Acute cystitis with hematuria: Secondary | ICD-10-CM | POA: Diagnosis not present

## 2023-10-24 DIAGNOSIS — R350 Frequency of micturition: Secondary | ICD-10-CM | POA: Diagnosis not present

## 2023-10-24 DIAGNOSIS — R300 Dysuria: Secondary | ICD-10-CM | POA: Diagnosis not present

## 2023-10-24 DIAGNOSIS — R82998 Other abnormal findings in urine: Secondary | ICD-10-CM

## 2023-10-24 DIAGNOSIS — R3915 Urgency of urination: Secondary | ICD-10-CM | POA: Diagnosis not present

## 2023-10-24 DIAGNOSIS — N401 Enlarged prostate with lower urinary tract symptoms: Secondary | ICD-10-CM

## 2023-10-24 LAB — URINALYSIS, ROUTINE W REFLEX MICROSCOPIC
Bilirubin, UA: NEGATIVE
Glucose, UA: NEGATIVE
Ketones, UA: NEGATIVE
Nitrite, UA: NEGATIVE
Specific Gravity, UA: >1.03 — ABNORMAL HIGH (ref 1.005–1.030)
Urobilinogen, Ur: 0.2 mg/dL (ref 0.2–1.0)
pH, UA: 6 (ref 5.0–7.5)

## 2023-10-24 LAB — MICROSCOPIC EXAMINATION: Bacteria, UA: NONE SEEN

## 2023-10-24 MED ORDER — DOXYCYCLINE HYCLATE 100 MG PO CAPS: 100.0000 mg | ORAL_CAPSULE | Freq: Two times a day (BID) | ORAL | 0 refills | Status: DC

## 2023-10-24 NOTE — Progress Notes (Signed)
 10/24/2023 2:40 PM   SANAD FEARNOW May 16, 1956 [id number]  Referring provider: Tommie Sams, DO [address] [place],  [place] [address]  dysuria   HPI: Mr Nine is a 68yo here for evaluation dysuria. He was doing well after TURP until 4 days ago when he developed dysuria, urinary frequency, and urinary urgency. UA is concerning for infection. NO other complaints today   PMH: Past Medical History:  Diagnosis Date   Arthritis    hands and shoulders (left shoulder is worse than right)   Aspiration pneumonia (HCC) 03/24/2014   after surgery for colostomy   BPH (benign prostatic hyperplasia)    Colostomy in place [facility])    Complication of anesthesia 03/24/2014   aspiration pneumonia after surgery for colostomy, no problem with  surgeries since then   Difficulty urinating    Dyspnea    @ times due to hernias, seeing [facility] for  surgical consultation 04-20-2022, can lie flat   GERD (gastroesophageal reflux disease)    History of kidney stones    Hypertension    Ileostomy present (HCC)    Incisional hernia    right abdomen   Parastomal hernia left    Rectal cancer, uT2uN0, s/p LAR/ileostomy 03/20/2014 colostomy Aug2015 12/25/2013   TCS MAY 2015    Restless leg    Right inguinal hernia    Shortening, leg, congenital    Left   Skin irritation    Surrounding ostomy site    Surgical History: Past Surgical History:  Procedure Laterality Date   COLON RESECTION N/A 03/24/2014   Procedure: Diagnostic  laparotomy, takedown of coloanal anastamosis, end colostomy, with wound vac;  Surgeon: Ardeth Sportsman, MD;  Location: [facility];  Service: General;  Laterality: N/A;   COLONOSCOPY N/A 12/22/2013   SLF: 1. Normal mucosa in the terminal ilium. 2. Moderate diverticulosis in the transverse colon, descending colon, and sigmoid colon. 3. Rectal bleeding due ot rectal mass and hemorrhoids 4. Moderate sized internal hemorrhoids.    COLONOSCOPY N/A 01/19/2015   Procedure:  COLONOSCOPY;  Surgeon: West Bali, MD;  [facility]: [facility];  Service: Endoscopy;  Laterality: N/A;  1215   COLONOSCOPY N/A 03/22/2018   Procedure: COLONOSCOPY;  Surgeon: West Bali, MD;  [facility]: [facility];  Service: Endoscopy;  Laterality: N/A;  2:15pm   COLONOSCOPY WITH PROPOFOL N/A 05/28/2023   Procedure: COLONOSCOPY WITH PROPOFOL;  Surgeon: Lanelle Bal, DO;  [facility];  Service: Endoscopy;  Laterality: N/A;  1:00 pm, asa 3   COLOSTOMY     03-24-2014   CYSTOSCOPY N/A 09/24/2023   Procedure: CYSTOSCOPY;  Surgeon: Malen Gauze, MD;  Location: [facility];  Service: Urology;  Laterality: N/A;   ESOPHAGOGASTRODUODENOSCOPY N/A 12/22/2013   SLF: Probable Candia esophagitis2. Probable Barretts esphagus 3. Mild gastritis.    EUS N/A 01/01/2014   Procedure: LOWER ENDOSCOPIC ULTRASOUND (EUS);  Surgeon: Rachael Fee, MD;  Location: [facility];  Service: Endoscopy;  Laterality: N/A;   EXAMINATION UNDER ANESTHESIA  08/25/2014   Procedure: EXAM UNDER ANESTHESIA;  Surgeon: Karie Soda, MD;  Location: [facility];  Service: General;;   EXTRACORPOREAL SHOCK WAVE LITHOTRIPSY Right 07/25/2022   Procedure: EXTRACORPOREAL SHOCK WAVE LITHOTRIPSY (ESWL);  Surgeon: Malen Gauze, MD;  Location: [facility];  Service: Urology;  Laterality: Right;   eye reattachment and ear reattachment  Right 08/21/1969   FLEXIBLE SIGMOIDOSCOPY N/A 03/24/2014   Procedure: FLEXIBLE SIGMOIDOSCOPY;  Surgeon: Ardeth Sportsman, MD;  Location:  [facility];  Service: General;  Laterality: N/A;   HIP FRACTURE SURGERY Left age 38 on 04-06-1970   ILEOSTOMY     ILEOSTOMY CLOSURE N/A 08/25/2014   Procedure: TAKEDOWN OF LOOP ILEOSTOMY ;  Surgeon: Karie Soda, MD;  Location: [facility];  Service: General;  Laterality: N/A;   INSERTION OF MESH N/A 04/11/2017   Procedure: INSERTION OF MESH ;  Surgeon: Karie Soda, MD;  Location: [facility];  Service: General;  Laterality: N/A;  BILATERAL TAP BLOCK   POLYPECTOMY   05/28/2023   Procedure: POLYPECTOMY;  Surgeon: Lanelle Bal, DO;  [facility];  Service: Endoscopy;;   THULIUM LASER TURP (TRANSURETHRAL RESECTION OF PROSTATE) N/A 04/04/2022   Procedure: THULIUM LASER TURP (TRANSURETHRAL RESECTION OF PROSTATE);  Surgeon: Jerilee Field, MD;  Location[facility];  Service: Urology;  Laterality: N/A;   TOTAL HIP ARTHROPLASTY Left 11/19/2015   Procedure: LEFT TOTAL HIP ARTHROPLASTY ANTERIOR APPROACH;  Surgeon: Kathryne Hitch, MD;  [facility];  Service: Orthopedics;  Laterality: Left;   TRANSURETHRAL RESECTION OF PROSTATE N/A 09/24/2023   Procedure: TRANSURETHRAL RESECTION OF THE PROSTATE (TURP);  Surgeon: Malen Gauze, MD;  [facility]: [facility];  Service: Urology;  Laterality: N/A;   VENTRAL HERNIA REPAIR N/A 04/11/2017   Procedure: LAPAROSCOPIC INCARCERATED PERISTOMAL AND INCARCERATED INCISIONAL HERNIA X2;  Surgeon: Karie Soda, MD;  Location: [facility];  Service: General;  Laterality: N/A;  BILATERAL TAP BLOCK    Home Medications:  Allergies as of 10/24/2023   No Known Allergies      Medication List        Accurate as of October 24, 2023  2:40 PM. If you have any questions, ask your nurse or doctor.          amLODipine 5 MG tablet Commonly known as: NORVASC Take 1 tablet (5 mg total) by mouth daily.   APPLE CIDER VINEGAR PO Take 1 tablet by mouth in the morning and at bedtime.   ascorbic acid 500 MG tablet Commonly known as: VITAMIN C Take 500 mg by mouth 2 (two) times daily.   aspirin EC 81 MG tablet Take 81 mg by mouth daily.   baclofen 10 MG tablet Commonly known as: LIORESAL TAKE 1 TABLET (10 MG TOTAL) BY MOUTH AT BEDTIME AS NEEDED FOR MUSCLE SPASMS. FOR LEG CRAMPING   chlorthalidone 25 MG tablet Commonly known as: HYGROTON Take 0.5 tablets (12.5 mg total) by mouth daily.   co-enzyme Q-10 30 MG capsule Take 30 mg by mouth daily.   cyanocobalamin 500 MCG tablet Commonly known as:  VITAMIN B12 Take 1,000 mcg by mouth daily.   metoprolol tartrate 25 MG tablet Commonly known as: LOPRESSOR Take 1 tablet (25 mg total) by mouth 2 (two) times daily.   pantoprazole 40 MG tablet Commonly known as: PROTONIX Take 1 tablet (40 mg total) by mouth 2 (two) times daily before a meal.   PRESERVISION AREDS 2 PO Take 1 capsule by mouth in the morning and at bedtime.   rosuvastatin 10 MG tablet Commonly known as: CRESTOR TAKE 1 TABLET BY MOUTH EVERY DAY   tadalafil 5 MG tablet Commonly known as: CIALIS Take 1 tablet (5 mg total) by mouth daily.   tadalafil 20 MG tablet Commonly known as: CIALIS Take 1 tablet (20 mg total) by mouth daily as needed.   tamsulosin 0.4 MG Caps capsule Commonly known as: FLOMAX TAKE 1 CAPSULE BY MOUTH EVERY DAY AFTER SUPPER   Turmeric 500 MG Caps  Take 500 mg by mouth 2 (two) times daily.   vitamin A 16109 UNIT capsule Take 25,000 Units by mouth daily.        Allergies: No Known Allergies  Family History: Family History  Problem Relation Age of Onset   Colon cancer Sister    Emphysema Mother    Heart attack Brother    Colon polyps Neg Hx     Social History:  reports that he quit smoking about 13 years ago. His smoking use included cigars. He has never used smokeless tobacco. He reports that he does not drink alcohol and does not use drugs.  ROS: All other review of systems were reviewed and are negative except what is noted above in HPI  Physical Exam: BP 132/77   Pulse 77   Constitutional:  Alert and oriented, No acute distress. HEENT: [place] AT, moist mucus membranes.  Trachea midline, no masses. Cardiovascular: No clubbing, cyanosis, or edema. Respiratory: Normal respiratory effort, no increased work of breathing. GI: Abdomen is soft, nontender, nondistended, no abdominal masses GU: No CVA tenderness.  Lymph: No cervical or inguinal lymphadenopathy. Skin: No rashes, bruises or suspicious lesions. Neurologic: Grossly  intact, no focal deficits, moving all 4 extremities. Psychiatric: Normal mood and affect.  Laboratory Data: Lab Results  Component Value Date   WBC 13.5 (H) 09/25/2023   HGB 11.4 (L) 09/25/2023   HCT 36.0 (L) 09/25/2023   MCV 86.5 09/25/2023   PLT 373 09/25/2023    Lab Results  Component Value Date   CREATININE 0.82 09/25/2023    No results found for: "PSA"  No results found for: "TESTOSTERONE"  Lab Results  Component Value Date   HGBA1C 5.7 (H) 04/10/2023    Urinalysis    Component Value Date/Time   COLORURINE YELLOW 07/22/2022 0115   APPEARANCEUR Clear 05/22/2023 1125   LABSPEC 1.014 07/22/2022 0115   PHURINE 6.0 07/22/2022 0115   GLUCOSEU Negative 05/22/2023 1125   HGBUR LARGE (A) 07/22/2022 0115   BILIRUBINUR Negative 05/22/2023 1125   KETONESUR NEGATIVE 07/22/2022 0115   PROTEINUR 2+ (A) 05/22/2023 1125   PROTEINUR NEGATIVE 07/22/2022 0115   UROBILINOGEN 0.2 04/05/2014 0506   NITRITE Negative 05/22/2023 1125   NITRITE NEGATIVE 07/22/2022 0115   LEUKOCYTESUR 1+ (A) 05/22/2023 1125   LEUKOCYTESUR NEGATIVE 07/22/2022 0115    Lab Results  Component Value Date   LABMICR See below: 05/22/2023   WBCUA 6-10 (A) 05/22/2023   LABEPIT 0-10 05/22/2023   MUCUS Present (A) 09/06/2022   BACTERIA Few (A) 05/22/2023    Pertinent Imaging:  Results for orders placed during the hospital encounter of 01/23/23  Abdomen 1 view (KUB)  Narrative CLINICAL DATA:  Kidney stones  EXAM: ABDOMEN - 1 VIEW  COMPARISON:  X-ray 09/06/2022.  CT 09/26/2022  FINDINGS: Gas seen in nondilated loops small and large bowel with some scattered stool. The left lateral hemi abdominal edge is clipped off the edge of the film as is the diaphragm. Left hip arthroplasty.  There are some punctate areas of density overlying the right upper quadrant. Some overlie the right reniform shadow. These could represent the patient's tiny stones. Small focus on the left as well. There are some  rounded densities in the pelvis which are more indeterminate and likely vascular.  IMPRESSION: Punctate densities overlying both reniform shadows. Possible known stones as seen on prior CT.   Electronically Signed By: Karen Kays M.D. On: 01/29/2023 11:41  No results found for this or any previous visit.  No  results found for this or any previous visit.  No results found for this or any previous visit.  Results for orders placed during the hospital encounter of 01/04/23  Ultrasound renal complete  Narrative CLINICAL DATA:  FOLLOW-UP RIGHT ESWL DECEMBER 2023  EXAM: RENAL / URINARY TRACT ULTRASOUND COMPLETE  COMPARISON:  CT OF THE ABDOMEN AND PELVIS WITHOUT CONTRAST September 26, 2022  FINDINGS: Right Kidney:  Renal measurements: 11.9 x 7.0 x 5.5 cm = volume: 240 mL. Contains a 6 mm nonobstructive stone  Left Kidney:  Renal measurements: 11.2 x 6.0 x 5.7 cm = volume: 199 mL. Echogenicity within normal limits. No mass or hydronephrosis visualized.  Bladder:  Shadowing calcifications along the posterior aspect of the bladder could represent stones in the bladder versus prostate calcifications.  Other:  None.  IMPRESSION: 1. 6 mm nonobstructive stone in the right kidney. 2. Shadowing calcifications along the posterior aspect of the bladder could represent stones in the bladder versus prostate calcifications.   Electronically Signed By: Gerome Sam III M.D. On: 01/04/2023 16:45  No results found for this or any previous visit.  No results found for this or any previous visit.  Results for orders placed in visit on 09/19/22  CT RENAL STONE STUDY  Narrative CLINICAL DATA:  Right flank pain since a lithotripsy in December 2023  EXAM: CT ABDOMEN AND PELVIS WITHOUT CONTRAST  TECHNIQUE: Multidetector CT imaging of the abdomen and pelvis was performed following the standard protocol without IV contrast.  RADIATION DOSE REDUCTION: This exam was  performed according to the departmental dose-optimization program which includes automated exposure control, adjustment of the mA and/or kV according to patient size and/or use of iterative reconstruction technique.  COMPARISON:  CT scan 07/21/2022  FINDINGS: Lower chest: The lung bases are clear of acute process. No pleural effusion or pulmonary lesions. The heart is normal in size. No pericardial effusion. The distal esophagus and aorta are unremarkable.  Hepatobiliary: No hepatic lesions or intrahepatic biliary dilatation. Gallbladder is mildly contracted. No common bile duct dilatation.  Pancreas: No mass, inflammation or ductal dilatation.  Spleen: Normal size.  No focal lesions.  Adrenals/Urinary Tract: The adrenal glands are normal.  Small bilateral renal calculi but no obstructing ureteral calculi or bladder calculi.  Stomach/Bowel: The stomach, duodenum and small bowel are grossly normal. No inflammatory changes, mass lesions or obstructive findings. Stable surgical changes from prior APR and left hemicolectomy with transverse colostomy. Small parastomal hernia noted. The terminal ileum and appendix are normal.  Vascular/Lymphatic: Stable aortic and branch vessel calcifications but no aneurysm. No mesenteric or retroperitoneal mass or adenopathy. Retroaortic left renal vein noted.  Reproductive: The prostate gland is enlarged. Scattered calcifications. The seminal vesicles are unremarkable.  Other: No pelvic mass or adenopathy. No free pelvic fluid collections. No inguinal mass or adenopathy. No abdominal wall hernia or subcutaneous lesions.  Musculoskeletal: No significant bony findings. Artifact related to a left total hip arthroplasty.  IMPRESSION: 1. Small bilateral renal calculi but no obstructing ureteral calculi or bladder calculi. 2. Stable surgical changes from prior APR and left hemicolectomy with transverse colostomy. Small parastomal hernia  noted. 3. No acute abdominal/pelvic findings, mass lesions or adenopathy.  Aortic Atherosclerosis (ICD10-I70.0).   Electronically Signed By: Rudie Meyer M.D. On: 09/26/2022 17:16   Assessment & Plan:    1. Chronic prostatitis Urine for culture Doxycycline 100mg  BID for 21 days - Urinalysis, Routine w reflex microscopic  2. Benign prostatic hyperplasia with weak urinary stream Doxyccyline 100mg  BID  for 7 days   No follow-ups on file.  Wilkie Aye, MD  [facility] [place]

## 2023-10-26 LAB — URINE CULTURE: Organism ID, Bacteria: NO GROWTH

## 2023-10-30 ENCOUNTER — Encounter: Payer: Self-pay | Admitting: Urology

## 2023-10-30 NOTE — Patient Instructions (Signed)
 Urinary Tract Infection, Male A urinary tract infection (UTI) is an infection in your urinary tract. The urinary tract is made up of the organs that make, store, and get rid of pee (urine) in your body. These organs include: The kidneys. The ureters. The bladder. The urethra. What are the causes? Most UTIs are caused by germs called bacteria. They may be in or near your genitals. These germs grow and cause swelling in your urinary tract. What increases the risk? You're more likely to get a UTI if: You have a soft tube called a catheter that drains your pee. You can't control when you pee or poop. You have trouble peeing because of: A prostate that's bigger than normal. A kidney stone. A urinary blockage. A nerve condition that affects your bladder. Not getting enough to drink. You're sexually active. You're an older adult. You're also more likely to get a UTI if you have other health problems, such as: Diabetes. A weak immune system. Your immune system is your body's defense system. Sickle cell disease. Injury of the spine. What are the signs or symptoms? Symptoms may include: Needing to pee right away. Peeing small amounts often. Trouble getting the stream started. Pain or burning when you pee. Blood in your pee. Pee that smells bad or odd. Pain in your belly or lower back. You may also: Feel confused. This may be the first symptom in older adults. Vomit. Not feel hungry. Feel tired or easily annoyed. Have a fever or chills. How is this diagnosed? A UTI is diagnosed based on your medical history and an exam. You may also have other tests. These may include: Pee tests. Blood tests. Tests for sexually transmitted infections (STIs). If you've had more than one UTI, you may need to have imaging studies done to find out why you keep getting them. How is this treated? A UTI can be treated by: Taking antibiotics or other medicines. Drinking enough fluid to keep your pee  pale yellow. In rare cases, a UTI can cause a very bad condition called sepsis. Sepsis may be treated in the hospital. Follow these instructions at home: Medicines Take your medicines only as told by your health care provider. If you were given antibiotics, take them as told by your provider. Do not stop taking them even if you start to feel better. General instructions Make sure you: Pee often and fully. Do not hold your pee for a long time. Pee after you have sex. Contact a health care provider if: Your symptoms don't get better after 1-2 days of taking antibiotics. Your symptoms go away and then come back. You have a fever or chills. You vomit or feel like you may vomit. Get help right away if: You have very bad pain in your back or lower belly. You faint. This information is not intended to replace advice given to you by your health care provider. Make sure you discuss any questions you have with your health care provider. Document Revised: 11/10/2022 Document Reviewed: 11/10/2022 Elsevier Patient Education  2024 [employer].

## 2023-11-08 ENCOUNTER — Ambulatory Visit: Admitting: Family Medicine

## 2023-11-08 VITALS — BP 130/54 | HR 83 | Temp 97.9°F | Ht 65.0 in | Wt 192.6 lb

## 2023-11-08 DIAGNOSIS — L568 Other specified acute skin changes due to ultraviolet radiation: Secondary | ICD-10-CM | POA: Insufficient documentation

## 2023-11-08 DIAGNOSIS — T50905A Adverse effect of unspecified drugs, medicaments and biological substances, initial encounter: Secondary | ICD-10-CM | POA: Diagnosis not present

## 2023-11-08 MED ORDER — SILVER SULFADIAZINE 1 % EX CREA: 1.0000 | TOPICAL_CREAM | Freq: Every day | CUTANEOUS | 0 refills | Status: DC

## 2023-11-08 NOTE — Progress Notes (Signed)
 Subjective:  Patient ID: [name], male    DOB: [birth year]  Age: 68 y.o. MRN: [id number]  CC:   Rash/Sunburn  HPI:  68 year old male presents for evaluation of the above.  Patient currently on Doxy per Urology. 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 This started last Saturday after working outside. He has a severe erythematous rash to the face and hands. Has not improved with OTC topicals.  Patient Active Problem List   Diagnosis Date Noted   Drug-induced photosensitivity 11/08/2023   Kidney stones 01/23/2023   Erectile dysfunction 01/23/2023   Benign prostatic hyperplasia 01/23/2023   DOE (dyspnea on exertion) 10/10/2022   Hyperlipidemia 09/14/2021   Colostomy in place [facility])    Essential hypertension, benign 05/12/2014   Obesity (BMI 30-39.9) 03/24/2014   Rectal cancer, uT2uN0, s/p LAR/ileostomy 03/20/2014 colostomy Aug2015 12/25/2013   GERD (gastroesophageal reflux disease) 11/27/2013    Social Hx   Social History   Socioeconomic History   Marital status: Married    Spouse name: Not on file   Number of children: Not on file   Years of education: Not on file   Highest education level: Not on file  Occupational History   Not on file  Tobacco Use   Smoking status: Former    Types: Cigars    Quit date: 2012    Years since quitting: 13.2   Smokeless tobacco: Never  Vaping Use   Vaping status: Never Used  Substance and Sexual Activity   Alcohol use: No    Comment: quit years ago   Drug use: No   Sexual activity: Not on file  Other Topics Concern   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   Social Drivers of Corporate investment banker Strain: Low Risk  (02/14/2022)   Overall Financial Resource Strain (CARDIA)    Difficulty of Paying Living Expenses: Not hard at all  Food Insecurity: No Food Insecurity (09/24/2023)   Hunger Vital Sign    Worried About Running Out of Food in the Last Year: Never true    Ran  Out of Food in the Last Year: Never true  Transportation Needs: No Transportation Needs (09/24/2023)   PRAPARE - Administrator, Civil Service (Medical): No    Lack of Transportation (Non-Medical): No  Physical Activity: Sufficiently Active (02/14/2022)   Exercise Vital Sign    Days of Exercise per Week: 5 days    Minutes of Exercise per Session: 30 min  Stress: No Stress Concern Present (02/14/2022)   [employer] of Occupational Health - Occupational Stress Questionnaire    Feeling of Stress : Not at all  Social Connections: Socially Integrated (09/24/2023)   Social Connection and Isolation Panel [NHANES]    Frequency of Communication with Friends and Family: More than three times a week    Frequency of Social Gatherings with Friends and Family: Not on file    Attends Religious Services: More than 4 times per year    Active Member of Clubs or Organizations: Not on file    Attends Engineer, structural: More than 4 times per year    Marital Status: Married    Review of Systems Per HPI  Objective:  BP (!) 130/54   Pulse 83   Temp 97.9 F (36.6 C)   Ht 5\' 5"  (1.651 m)   Wt 192 lb 9.6 oz (87.4 kg)  SpO2 95%   BMI 32.05 kg/m      11/08/2023    3:48 PM 10/24/2023    2:23 PM 10/03/2023    9:52 AM  BP/Weight  Systolic BP 130 132 130  Diastolic BP 54 77 82  Wt. (Lbs) 192.6  187  BMI 32.05 kg/m2  31.12 kg/m2    Physical Exam Vitals and nursing note reviewed.  Constitutional:      General: He is not in acute distress. HENT:     Head:     Comments: Severe erythema and irritation to the face and dorsum of the hands. Neurological:     Mental Status: He is alert.     Lab Results  Component Value Date   WBC 13.5 (H) 09/25/2023   HGB 11.4 (L) 09/25/2023   HCT 36.0 (L) 09/25/2023   PLT 373 09/25/2023   GLUCOSE 162 (H) 09/25/2023   CHOL 140 04/10/2023   TRIG 114 04/10/2023   HDL 40 04/10/2023   LDLCALC 79 04/10/2023   ALT 18 04/10/2023   AST 15  04/10/2023   NA 136 09/25/2023   K 3.7 09/25/2023   CL 101 09/25/2023   CREATININE 0.82 09/25/2023   BUN 17 09/25/2023   CO2 25 09/25/2023   TSH 2.029 10/18/2018   INR 0.99 12/25/2013   HGBA1C 5.7 (H) 04/10/2023     Assessment & Plan:  Drug-induced photosensitivity Assessment & Plan: Secondary to Doxy. I have reached out to Urology. Stopping Doxy. Silvadene as directed.   Other orders -     Silver sulfADIAZINE; Apply 1 Application topically daily.  Dispense: 50 g; Refill: 0    Follow-up:  Return if symptoms worsen or fail to improve.  Everlene Other DO [facility]

## 2023-11-08 NOTE — Patient Instructions (Signed)
 Stop Doxy.  Silvadene as prescribed.  Stay covered and avoid a lot of sun. Good sunscreen.

## 2023-11-08 NOTE — Assessment & Plan Note (Signed)
 Secondary to Doxy. I have reached out to Urology. Stopping Doxy. Silvadene as directed.

## 2023-11-13 ENCOUNTER — Telehealth: Payer: Self-pay | Admitting: Urology

## 2023-11-13 NOTE — Telephone Encounter (Signed)
 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 He needs something else to take instead of doxy.

## 2023-11-19 MED ORDER — SULFAMETHOXAZOLE-TRIMETHOPRIM 800-160 MG PO TABS: 1.0000 | ORAL_TABLET | Freq: Two times a day (BID) | ORAL | 0 refills | Status: DC

## 2023-11-19 NOTE — Telephone Encounter (Signed)
 Patient called and made aware of Bactrim DS sent to pharmacy per Dr. Ronne Binning.

## 2023-11-20 ENCOUNTER — Telehealth: Payer: Self-pay | Admitting: Urology

## 2023-11-20 NOTE — Telephone Encounter (Signed)
 Took new RX and caused vomiting and diarrhea please call asap

## 2023-11-20 NOTE — Telephone Encounter (Signed)
 It looks like this was sent in for prostatitis by Dr. Ronne Binning

## 2023-11-20 NOTE — Telephone Encounter (Signed)
 Please see patient concern to Bactrim rx, he was taking with food and taking as directed.  Patient advised to stop rx for now, please advise on an alternative.

## 2023-11-21 ENCOUNTER — Other Ambulatory Visit: Payer: Self-pay

## 2023-11-21 MED ORDER — AMOXICILLIN-POT CLAVULANATE 875-125 MG PO TABS: 1.0000 | ORAL_TABLET | Freq: Two times a day (BID) | ORAL | 0 refills | Status: DC

## 2023-11-21 NOTE — Telephone Encounter (Signed)
 Verbal from Dr. Ronne Binning to send in Augmentin BID for 21 days.  [facility] and patient aware.

## 2023-11-28 DIAGNOSIS — Z933 Colostomy status: Secondary | ICD-10-CM | POA: Diagnosis not present

## 2023-11-30 ENCOUNTER — Ambulatory Visit: Admitting: Urology

## 2023-11-30 ENCOUNTER — Encounter: Payer: Self-pay | Admitting: Urology

## 2023-11-30 VITALS — BP 138/86 | HR 64

## 2023-11-30 DIAGNOSIS — Z09 Encounter for follow-up examination after completed treatment for conditions other than malignant neoplasm: Secondary | ICD-10-CM

## 2023-11-30 LAB — POCT URINALYSIS DIPSTICK
Bilirubin, UA: NEGATIVE
Glucose, UA: NEGATIVE
Ketones, UA: NEGATIVE
Nitrite, UA: NEGATIVE
Protein, UA: NEGATIVE
Spec Grav, UA: 1.015 (ref 1.010–1.025)
Urobilinogen, UA: 0.2 U/dL
pH, UA: 6.5 (ref 5.0–8.0)

## 2023-11-30 NOTE — Progress Notes (Signed)
 11/30/2023 9:58 AM   [name] Oct 15, 1955 [id number]  Referring provider: Cook, Connor G, DO [address] [place],  [place] [address]  Followup TURP   HPI: [name] is a 68yo here for followup after TURP. Urine stream significantly improved. Dysuria has improved on augmentin    PMH: Past Medical History:  Diagnosis Date   Arthritis    hands and shoulders (left shoulder is worse than right)   Aspiration pneumonia (HCC) 03/24/2014   after surgery for colostomy   BPH (benign prostatic hyperplasia)    Colostomy in place [facility])    Complication of anesthesia 03/24/2014   aspiration pneumonia after surgery for colostomy, no problem with  surgeries since then   Difficulty urinating    Dyspnea    @ times due to hernias, seeing [facility] for  surgical consultation 04-20-2022, can lie flat   GERD (gastroesophageal reflux disease)    History of kidney stones    Hypertension    Ileostomy present (HCC)    Incisional hernia    right abdomen   Parastomal hernia left    Rectal cancer, uT2uN0, s/p LAR/ileostomy 03/20/2014 colostomy Aug2015 12/25/2013   TCS MAY 2015    Restless leg    Right inguinal hernia    Shortening, leg, congenital    Left   Skin irritation    Surrounding ostomy site    Surgical History: Past Surgical History:  Procedure Laterality Date   COLON RESECTION N/A 03/24/2014   Procedure: Diagnostic  laparotomy, takedown of coloanal anastamosis, end colostomy, with wound vac;  Surgeon: Connor Goodie, MD;  Location: [facility];  Service: General;  Laterality: N/A;   COLONOSCOPY N/A 12/22/2013   SLF: 1. Normal mucosa in the terminal ilium. 2. Moderate diverticulosis in the transverse colon, descending colon, and sigmoid colon. 3. Rectal bleeding due ot rectal mass and hemorrhoids 4. Moderate sized internal hemorrhoids.    COLONOSCOPY N/A 01/19/2015   Procedure: COLONOSCOPY;  Surgeon: Connor Jubilee, MD;  [facility];  Service: Endoscopy;   Laterality: N/A;  1215   COLONOSCOPY N/A 03/22/2018   Procedure: COLONOSCOPY;  Surgeon: Connor Jubilee, MD;  [facility];  Service: Endoscopy;  Laterality: N/A;  2:15pm   COLONOSCOPY WITH PROPOFOL  N/A 05/28/2023   Procedure: COLONOSCOPY WITH PROPOFOL ;  Surgeon: Connor Greening, DO;  [facility];  Service: Endoscopy;  Laterality: N/A;  1:00 pm, asa 3   COLOSTOMY     03-24-2014   CYSTOSCOPY N/A 09/24/2023   Procedure: CYSTOSCOPY;  Surgeon: Connor Severs, MD;  Location: [facility];  Service: Urology;  Laterality: N/A;   ESOPHAGOGASTRODUODENOSCOPY N/A 12/22/2013   SLF: Probable Candia esophagitis2. Probable Barretts esphagus 3. Mild gastritis.    EUS N/A 01/01/2014   Procedure: LOWER ENDOSCOPIC ULTRASOUND (EUS);  Surgeon: Connor Medford, MD;  [name];  Service: Endoscopy;  Laterality: N/A;   EXAMINATION UNDER ANESTHESIA  08/25/2014   Procedure: EXAM UNDER ANESTHESIA;  Surgeon: Connor Champagne, MD;  [facility]: [facility];  Service: General;;   EXTRACORPOREAL SHOCK WAVE LITHOTRIPSY Right 07/25/2022   Procedure: EXTRACORPOREAL SHOCK WAVE LITHOTRIPSY (ESWL);  Surgeon: Connor Severs, MD;  Location: [facility];  Service: Urology;  Laterality: Right;   eye reattachment and ear reattachment  Right 08/21/1969   FLEXIBLE SIGMOIDOSCOPY N/A 03/24/2014   Procedure: FLEXIBLE SIGMOIDOSCOPY;  Surgeon: Connor Goodie, MD;  [facility]: [facility];  Service: General;  Laterality: N/A;   HIP FRACTURE SURGERY Left age 36 on  04-06-1970   ILEOSTOMY     ILEOSTOMY CLOSURE N/A 08/25/2014   Procedure: TAKEDOWN OF LOOP ILEOSTOMY ;  Surgeon: Connor Champagne, MD;  [facility]: [facility];  Service: General;  Laterality: N/A;   INSERTION OF MESH N/A 04/11/2017   Procedure: INSERTION OF MESH ;  Surgeon: Connor Champagne, MD;  [facility];  Service: General;  Laterality: N/A;  BILATERAL TAP BLOCK   POLYPECTOMY  05/28/2023   Procedure: POLYPECTOMY;  Surgeon: Connor Greening, DO;  [facility];   Service: Endoscopy;;   THULIUM LASER TURP (TRANSURETHRAL RESECTION OF PROSTATE) N/A 04/04/2022   Procedure: THULIUM LASER TURP (TRANSURETHRAL RESECTION OF PROSTATE);  Surgeon: Connor Coyer, MD;  Location: [facility];  Service: Urology;  Laterality: N/A;   TOTAL HIP ARTHROPLASTY Left 11/19/2015   Procedure: LEFT TOTAL HIP ARTHROPLASTY ANTERIOR APPROACH;  Surgeon: Connor Lao, MD;  [facility];  Service: Orthopedics;  Laterality: Left;   TRANSURETHRAL RESECTION OF PROSTATE N/A 09/24/2023   Procedure: TRANSURETHRAL RESECTION OF THE PROSTATE (TURP);  Surgeon: Connor Severs, MD;  [facility];  Service: Urology;  Laterality: N/A;   VENTRAL HERNIA REPAIR N/A 04/11/2017   Procedure: LAPAROSCOPIC INCARCERATED PERISTOMAL AND INCARCERATED INCISIONAL HERNIA X2;  Surgeon: Connor Champagne, MD;  [facility];  Service: General;  Laterality: N/A;  BILATERAL TAP BLOCK    Home Medications:  Allergies as of 11/30/2023   No Known Allergies      Medication List        Accurate as of November 30, 2023  9:58 AM. If you have any questions, ask your nurse or doctor.          amLODipine  5 MG tablet Commonly known as: NORVASC  Take 1 tablet (5 mg total) by mouth daily.   amoxicillin -clavulanate 875-125 MG tablet Commonly known as: AUGMENTIN  Take 1 tablet by mouth every 12 (twelve) hours.   APPLE CIDER VINEGAR PO Take 1 tablet by mouth in the morning and at bedtime.   ascorbic acid  500 MG tablet Commonly known as: VITAMIN C  Take 500 mg by mouth 2 (two) times daily.   aspirin  EC 81 MG tablet Take 81 mg by mouth daily.   baclofen  10 MG tablet Commonly known as: LIORESAL  TAKE 1 TABLET (10 MG TOTAL) BY MOUTH AT BEDTIME AS NEEDED FOR MUSCLE SPASMS. FOR LEG CRAMPING   chlorthalidone  25 MG tablet Commonly known as: HYGROTON  Take 0.5 tablets (12.5 mg total) by mouth daily.   co-enzyme Q-10 30 MG capsule Take 30 mg by mouth daily.   cyanocobalamin  500 MCG  tablet Commonly known as: VITAMIN B12 Take 1,000 mcg by mouth daily.   metoprolol  tartrate 25 MG tablet Commonly known as: LOPRESSOR  Take 1 tablet (25 mg total) by mouth 2 (two) times daily.   pantoprazole  40 MG tablet Commonly known as: PROTONIX  Take 1 tablet (40 mg total) by mouth 2 (two) times daily before a meal.   PRESERVISION AREDS 2 PO Take 1 capsule by mouth in the morning and at bedtime.   rosuvastatin  10 MG tablet Commonly known as: CRESTOR  TAKE 1 TABLET BY MOUTH EVERY DAY   silver  sulfADIAZINE  1 % cream Commonly known as: Silvadene  Apply 1 Application topically daily.   tadalafil  5 MG tablet Commonly known as: CIALIS  Take 1 tablet (5 mg total) by mouth daily.   tadalafil  20 MG tablet Commonly known as: CIALIS  Take 1 tablet (20 mg total) by mouth daily as needed.   tamsulosin  0.4 MG Caps capsule Commonly  known as: FLOMAX  TAKE 1 CAPSULE BY MOUTH EVERY DAY AFTER SUPPER   Turmeric 500 MG Caps Take 500 mg by mouth 2 (two) times daily.   vitamin A 25000 UNIT capsule Take 25,000 Units by mouth daily.        Allergies: No Known Allergies  Family History: Family History  Problem Relation Age of Onset   Colon cancer Sister    Emphysema Mother    Heart attack Brother    Colon polyps Neg Hx     Social History:  reports that he quit smoking about 13 years ago. His smoking use included cigars. He has never used smokeless tobacco. He reports that he does not drink alcohol and does not use drugs.  ROS: All other review of systems were reviewed and are negative except what is noted above in HPI  Physical Exam: BP 138/86   Pulse 64   Constitutional:  Alert and oriented, No acute distress. HEENT: [place] AT, moist mucus membranes.  Trachea midline, no masses. Cardiovascular: No clubbing, cyanosis, or edema. Respiratory: Normal respiratory effort, no increased work of breathing. GI: Abdomen is soft, nontender, nondistended, no abdominal masses GU: No CVA  tenderness.  Lymph: No cervical or inguinal lymphadenopathy. Skin: No rashes, bruises or suspicious lesions. Neurologic: Grossly intact, no focal deficits, moving all 4 extremities. Psychiatric: Normal mood and affect.  Laboratory Data: Lab Results  Component Value Date   WBC 13.5 (H) 09/25/2023   HGB 11.4 (L) 09/25/2023   HCT 36.0 (L) 09/25/2023   MCV 86.5 09/25/2023   PLT 373 09/25/2023    Lab Results  Component Value Date   CREATININE 0.82 09/25/2023    No results found for: "PSA"  No results found for: "TESTOSTERONE"  Lab Results  Component Value Date   HGBA1C 5.7 (H) 04/10/2023    Urinalysis    Component Value Date/Time   COLORURINE YELLOW 07/22/2022 0115   APPEARANCEUR Cloudy (A) 10/24/2023 1434   LABSPEC 1.014 07/22/2022 0115   PHURINE 6.0 07/22/2022 0115   GLUCOSEU Negative 10/24/2023 1434   HGBUR LARGE (A) 07/22/2022 0115   BILIRUBINUR Negative 10/24/2023 1434   KETONESUR NEGATIVE 07/22/2022 0115   PROTEINUR 1+ (A) 10/24/2023 1434   PROTEINUR NEGATIVE 07/22/2022 0115   UROBILINOGEN 0.2 04/05/2014 0506   NITRITE Negative 10/24/2023 1434   NITRITE NEGATIVE 07/22/2022 0115   LEUKOCYTESUR 1+ (A) 10/24/2023 1434   LEUKOCYTESUR NEGATIVE 07/22/2022 0115    Lab Results  Component Value Date   LABMICR See below: 10/24/2023   WBCUA 11-30 (A) 10/24/2023   LABEPIT 0-10 10/24/2023   MUCUS Present (A) 09/06/2022   BACTERIA None seen 10/24/2023    Pertinent Imaging:  Results for orders placed during the hospital encounter of 01/23/23  Abdomen 1 view (KUB)  Narrative CLINICAL DATA:  Kidney stones  EXAM: ABDOMEN - 1 VIEW  COMPARISON:  X-ray 09/06/2022.  CT 09/26/2022  FINDINGS: Gas seen in nondilated loops small and large bowel with some scattered stool. The left lateral hemi abdominal edge is clipped off the edge of the film as is the diaphragm. Left hip arthroplasty.  There are some punctate areas of density overlying the right upper quadrant.  Some overlie the right reniform shadow. These could represent the patient's tiny stones. Small focus on the left as well. There are some rounded densities in the pelvis which are more indeterminate and likely vascular.  IMPRESSION: Punctate densities overlying both reniform shadows. Possible known stones as seen on prior CT.   Electronically Signed By:  Adrianna Horde M.D. On: 01/29/2023 11:41  No results found for this or any previous visit.  No results found for this or any previous visit.  No results found for this or any previous visit.  Results for orders placed during the hospital encounter of 01/04/23  Ultrasound renal complete  Narrative CLINICAL DATA:  FOLLOW-UP RIGHT ESWL DECEMBER 2023  EXAM: RENAL / URINARY TRACT ULTRASOUND COMPLETE  COMPARISON:  CT OF THE ABDOMEN AND PELVIS WITHOUT CONTRAST September 26, 2022  FINDINGS: Right Kidney:  Renal measurements: 11.9 x 7.0 x 5.5 cm = volume: 240 mL. Contains a 6 mm nonobstructive stone  Left Kidney:  Renal measurements: 11.2 x 6.0 x 5.7 cm = volume: 199 mL. Echogenicity within normal limits. No mass or hydronephrosis visualized.  Bladder:  Shadowing calcifications along the posterior aspect of the bladder could represent stones in the bladder versus prostate calcifications.  Other:  None.  IMPRESSION: 1. 6 mm nonobstructive stone in the right kidney. 2. Shadowing calcifications along the posterior aspect of the bladder could represent stones in the bladder versus prostate calcifications.   Electronically Signed By: Lorrene Rosser III M.D. On: 01/04/2023 16:45  No results found for this or any previous visit.  No results found for this or any previous visit.  Results for orders placed in visit on 09/19/22  CT RENAL STONE STUDY  Narrative CLINICAL DATA:  Right flank pain since a lithotripsy in December 2023  EXAM: CT ABDOMEN AND PELVIS WITHOUT CONTRAST  TECHNIQUE: Multidetector CT  imaging of the abdomen and pelvis was performed following the standard protocol without IV contrast.  RADIATION DOSE REDUCTION: This exam was performed according to the departmental dose-optimization program which includes automated exposure control, adjustment of the mA and/or kV according to patient size and/or use of iterative reconstruction technique.  COMPARISON:  CT scan 07/21/2022  FINDINGS: Lower chest: The lung bases are clear of acute process. No pleural effusion or pulmonary lesions. The heart is normal in size. No pericardial effusion. The distal esophagus and aorta are unremarkable.  Hepatobiliary: No hepatic lesions or intrahepatic biliary dilatation. Gallbladder is mildly contracted. No common bile duct dilatation.  Pancreas: No mass, inflammation or ductal dilatation.  Spleen: Normal size.  No focal lesions.  Adrenals/Urinary Tract: The adrenal glands are normal.  Small bilateral renal calculi but no obstructing ureteral calculi or bladder calculi.  Stomach/Bowel: The stomach, duodenum and small bowel are grossly normal. No inflammatory changes, mass lesions or obstructive findings. Stable surgical changes from prior APR and left hemicolectomy with transverse colostomy. Small parastomal hernia noted. The terminal ileum and appendix are normal.  Vascular/Lymphatic: Stable aortic and branch vessel calcifications but no aneurysm. No mesenteric or retroperitoneal mass or adenopathy. Retroaortic left renal vein noted.  Reproductive: The prostate gland is enlarged. Scattered calcifications. The seminal vesicles are unremarkable.  Other: No pelvic mass or adenopathy. No free pelvic fluid collections. No inguinal mass or adenopathy. No abdominal wall hernia or subcutaneous lesions.  Musculoskeletal: No significant bony findings. Artifact related to a left total hip arthroplasty.  IMPRESSION: 1. Small bilateral renal calculi but no obstructing ureteral  calculi or bladder calculi. 2. Stable surgical changes from prior APR and left hemicolectomy with transverse colostomy. Small parastomal hernia noted. 3. No acute abdominal/pelvic findings, mass lesions or adenopathy.  Aortic Atherosclerosis (ICD10-I70.0).   Electronically Signed By: Marrian Siva M.D. On: 09/26/2022 17:16   Assessment & Plan:    1. Postop check (Primary) Followup 4 weeks   No follow-ups  on file.  Johnie Nailer, MD  [facility] [place]

## 2023-11-30 NOTE — Patient Instructions (Signed)

## 2023-12-05 ENCOUNTER — Other Ambulatory Visit: Payer: Self-pay

## 2023-12-05 ENCOUNTER — Telehealth: Payer: Self-pay

## 2023-12-05 MED ORDER — ROSUVASTATIN CALCIUM 10 MG PO TABS: 10.0000 mg | ORAL_TABLET | Freq: Every day | ORAL | 3 refills | Status: AC

## 2023-12-05 NOTE — Telephone Encounter (Signed)
 Prescription Request  12/05/2023  LOV: Visit date not found  What is the name of the medication or equipment? rosuvastatin (CRESTOR) 10 MG tablet   Have you contacted your pharmacy to request a refill? Yes   Which pharmacy would you like this sent to?   [facility]    Patient notified that their request is being sent to the clinical staff for review and that they should receive a response within 2 business days.   Please advise at Mobile [phone] (mobile)

## 2023-12-07 ENCOUNTER — Telehealth: Payer: Self-pay

## 2023-12-07 NOTE — Telephone Encounter (Signed)
 Copied from CRM [id number]. Topic: Clinical - Prescription Issue >> Dec 06, 2023  8:18 AM Dorthula Gavel H wrote: Reason for CRM: Pt called in stating he did not receive all the supplies he should have for his ostomy bad. He states he need Barrier Rings (product # [id number]), he only received one box and was supposed to have  two boxes. He is also in need of [employer] paste (product [phone]). All of these things have to be called into [facility]. Their phone number is [phone]

## 2023-12-10 DIAGNOSIS — K435 Parastomal hernia without obstruction or  gangrene: Secondary | ICD-10-CM | POA: Diagnosis not present

## 2023-12-31 ENCOUNTER — Encounter: Payer: Self-pay | Admitting: Urology

## 2023-12-31 ENCOUNTER — Telehealth: Payer: Self-pay

## 2023-12-31 ENCOUNTER — Ambulatory Visit (INDEPENDENT_AMBULATORY_CARE_PROVIDER_SITE_OTHER): Payer: No Typology Code available for payment source | Admitting: Urology

## 2023-12-31 ENCOUNTER — Ambulatory Visit (HOSPITAL_COMMUNITY)
Admission: RE | Admit: 2023-12-31 | Discharge: 2023-12-31 | Disposition: A | Discharge status: Home / Self Care | Source: Ambulatory Visit | Attending: Urology | Admitting: Urology

## 2023-12-31 VITALS — BP 143/83 | HR 67

## 2023-12-31 DIAGNOSIS — R3912 Poor urinary stream: Secondary | ICD-10-CM | POA: Diagnosis not present

## 2023-12-31 DIAGNOSIS — Z9079 Acquired absence of other genital organ(s): Secondary | ICD-10-CM

## 2023-12-31 DIAGNOSIS — N529 Male erectile dysfunction, unspecified: Secondary | ICD-10-CM

## 2023-12-31 DIAGNOSIS — N5201 Erectile dysfunction due to arterial insufficiency: Secondary | ICD-10-CM | POA: Diagnosis not present

## 2023-12-31 DIAGNOSIS — N200 Calculus of kidney: Secondary | ICD-10-CM | POA: Diagnosis not present

## 2023-12-31 DIAGNOSIS — Z87442 Personal history of urinary calculi: Secondary | ICD-10-CM | POA: Diagnosis not present

## 2023-12-31 DIAGNOSIS — N401 Enlarged prostate with lower urinary tract symptoms: Secondary | ICD-10-CM

## 2023-12-31 DIAGNOSIS — Z7689 Persons encountering health services in other specified circumstances: Secondary | ICD-10-CM | POA: Diagnosis not present

## 2023-12-31 DIAGNOSIS — Z96642 Presence of left artificial hip joint: Secondary | ICD-10-CM | POA: Diagnosis not present

## 2023-12-31 LAB — URINALYSIS, ROUTINE W REFLEX MICROSCOPIC
Bilirubin, UA: NEGATIVE
Glucose, UA: NEGATIVE
Ketones, UA: NEGATIVE
Leukocytes,UA: NEGATIVE
Nitrite, UA: NEGATIVE
RBC, UA: NEGATIVE
Specific Gravity, UA: 1.025 (ref 1.005–1.030)
Urobilinogen, Ur: 0.2 mg/dL (ref 0.2–1.0)
pH, UA: 6 (ref 5.0–7.5)

## 2023-12-31 MED ORDER — TADALAFIL 5 MG PO TABS: 5.0000 mg | ORAL_TABLET | Freq: Every day | ORAL | 11 refills | Status: DC

## 2023-12-31 MED ORDER — TADALAFIL 20 MG PO TABS: 20.0000 mg | ORAL_TABLET | Freq: Every day | ORAL | 5 refills | Status: DC | PRN

## 2023-12-31 NOTE — Progress Notes (Signed)
 Name: [name] DOB: 01/16/1956 MRN: [id number]  History of Present Illness: [name] is a 68 y.o. male who presents today at [facility].  GU History includes: 1. BPH. - Denies family history of prostate cancer. - Previously took Flomax  (Tamsulosin ) 0.4 mg daily. - 04/04/2022: TURP by Dr. Derrick Fling. - 09/24/2023: TURP by Dr. Claretta Croft. Benign pathology. 2. Kidney stones. - 07/26/2022: Underwent right ESWL by Dr. Claretta Croft. 3. Erectile dysfunction. - Previously took Tadalafil  5 mg daily.  - Has Tadalafil  20 mg for PRN use.   PSA values: - 05/11/2016: 1.9 - 03/14/2017: 2.1 - 09/14/2021: 4.3 - 12/12/2021: 3.3  Today: He reports feeling well. Reports resolution of prior burning with urination.   Reports nocturia x1-2, which he states is manageable. Denies urinary urgency, frequency, dysuria, gross hematuria, weak urinary stream, hesitancy, straining to void, or sensations of incomplete emptying. Reports rare urge incontinence if he has had to delay voiding.   He denies recent episode of stone pain / passage. He denies acute flank pain / abdominal pain.   Medications: Current Outpatient Medications  Medication Sig Dispense Refill   amLODipine  (NORVASC ) 5 MG tablet Take 1 tablet (5 mg total) by mouth daily. 90 tablet 3   APPLE CIDER VINEGAR PO Take 1 tablet by mouth in the morning and at bedtime.     aspirin  EC 81 MG tablet Take 81 mg by mouth daily.     baclofen  (LIORESAL ) 10 MG tablet TAKE 1 TABLET (10 MG TOTAL) BY MOUTH AT BEDTIME AS NEEDED FOR MUSCLE SPASMS. FOR LEG CRAMPING 90 tablet 0   Beta Carotene (VITAMIN A) 25000 UNIT capsule Take 25,000 Units by mouth daily.     co-enzyme Q-10 30 MG capsule Take 30 mg by mouth daily.     metoprolol  tartrate (LOPRESSOR ) 25 MG tablet Take 1 tablet (25 mg total) by mouth 2 (two) times daily. 180 tablet 3   Multiple Vitamins-Minerals (PRESERVISION AREDS 2 PO) Take 1 capsule by mouth in the morning and at  bedtime.     pantoprazole  (PROTONIX ) 40 MG tablet Take 1 tablet (40 mg total) by mouth 2 (two) times daily before a meal. 180 tablet 1   rosuvastatin  (CRESTOR ) 10 MG tablet Take 1 tablet (10 mg total) by mouth daily. 90 tablet 3   silver  sulfADIAZINE  (SILVADENE ) 1 % cream Apply 1 Application topically daily. 50 g 0   Turmeric 500 MG CAPS Take 500 mg by mouth 2 (two) times daily.      vitamin B-12 (CYANOCOBALAMIN ) 500 MCG tablet Take 1,000 mcg by mouth daily.     vitamin C  (ASCORBIC ACID ) 500 MG tablet Take 500 mg by mouth 2 (two) times daily.     chlorthalidone  (HYGROTON ) 25 MG tablet Take 0.5 tablets (12.5 mg total) by mouth daily. 45 tablet 3   tadalafil  (CIALIS ) 20 MG tablet Take 1 tablet (20 mg total) by mouth daily as needed. (Patient not taking: Reported on 12/31/2023) 10 tablet 5   No current facility-administered medications for this visit.    Allergies: No Known Allergies  Past Medical History:  Diagnosis Date   Arthritis    hands and shoulders (left shoulder is worse than right)   Aspiration pneumonia (HCC) 03/24/2014   after surgery for colostomy   BPH (benign prostatic hyperplasia)    Colostomy in place [facility])    Complication of anesthesia 03/24/2014   aspiration pneumonia after surgery for colostomy, no problem with  surgeries since then   Difficulty  urinating    Dyspnea    @ times due to hernias, seeing [facility] for  surgical consultation 04-20-2022, can lie flat   GERD (gastroesophageal reflux disease)    History of kidney stones    Hypertension    Ileostomy present (HCC)    Incisional hernia    right abdomen   Parastomal hernia left    Rectal cancer, uT2uN0, s/p LAR/ileostomy 03/20/2014 colostomy Aug2015 12/25/2013   TCS MAY 2015    Restless leg    Right inguinal hernia    Shortening, leg, congenital    Left   Skin irritation    Surrounding ostomy site   Past Surgical History:  Procedure Laterality Date   COLON RESECTION N/A 03/24/2014   Procedure:  Diagnostic  laparotomy, takedown of coloanal anastamosis, end colostomy, with wound vac;  Surgeon: Eddye Goodie, MD;  Location: [facility];  Service: General;  Laterality: N/A;   COLONOSCOPY N/A 12/22/2013   SLF: 1. Normal mucosa in the terminal ilium. 2. Moderate diverticulosis in the transverse colon, descending colon, and sigmoid colon. 3. Rectal bleeding due ot rectal mass and hemorrhoids 4. Moderate sized internal hemorrhoids.    COLONOSCOPY N/A 01/19/2015   Procedure: COLONOSCOPY;  Surgeon: Alyce Jubilee, MD;  [facility]: [facility];  Service: Endoscopy;  Laterality: N/A;  1215   COLONOSCOPY N/A 03/22/2018   Procedure: COLONOSCOPY;  Surgeon: Alyce Jubilee, MD;  [facility];  Service: Endoscopy;  Laterality: N/A;  2:15pm   COLONOSCOPY WITH PROPOFOL  N/A 05/28/2023   Procedure: COLONOSCOPY WITH PROPOFOL ;  Surgeon: Vinetta Greening, DO;  [facility];  Service: Endoscopy;  Laterality: N/A;  1:00 pm, asa 3   COLOSTOMY     03-24-2014   CYSTOSCOPY N/A 09/24/2023   Procedure: CYSTOSCOPY;  Surgeon: Marco Severs, MD;  Location: [facility];  Service: Urology;  Laterality: N/A;   ESOPHAGOGASTRODUODENOSCOPY N/A 12/22/2013   SLF: Probable Candia esophagitis2. Probable Barretts esphagus 3. Mild gastritis.    EUS N/A 01/01/2014   Procedure: LOWER ENDOSCOPIC ULTRASOUND (EUS);  Surgeon: Janel Medford, MD;  Location: [facility];  Service: Endoscopy;  Laterality: N/A;   EXAMINATION UNDER ANESTHESIA  08/25/2014   Procedure: EXAM UNDER ANESTHESIA;  Surgeon: Candyce Champagne, MD;  Location: [facility];  Service: General;;   EXTRACORPOREAL SHOCK WAVE LITHOTRIPSY Right 07/25/2022   Procedure: EXTRACORPOREAL SHOCK WAVE LITHOTRIPSY (ESWL);  Surgeon: Marco Severs, MD;  Location: [facility];  Service: Urology;  Laterality: Right;   eye reattachment and ear reattachment  Right 08/21/1969   FLEXIBLE SIGMOIDOSCOPY N/A 03/24/2014   Procedure: FLEXIBLE SIGMOIDOSCOPY;  Surgeon: Eddye Goodie, MD;   Location: [facility];  Service: General;  Laterality: N/A;   HIP FRACTURE SURGERY Left age 68 on 04-06-1970   ILEOSTOMY     ILEOSTOMY CLOSURE N/A 08/25/2014   Procedure: TAKEDOWN OF LOOP ILEOSTOMY ;  Surgeon: Candyce Champagne, MD;  Location: [facility];  Service: General;  Laterality: N/A;   INSERTION OF MESH N/A 04/11/2017   Procedure: INSERTION OF MESH ;  Surgeon: Candyce Champagne, MD;  Location: [facility];  Service: General;  Laterality: N/A;  BILATERAL TAP BLOCK   POLYPECTOMY  05/28/2023   Procedure: POLYPECTOMY;  Surgeon: Vinetta Greening, DO;  [facility];  Service: Endoscopy;;   THULIUM LASER TURP (TRANSURETHRAL RESECTION OF PROSTATE) N/A 04/04/2022   Procedure: THULIUM LASER TURP (TRANSURETHRAL RESECTION OF PROSTATE);  Surgeon: Christina Coyer, MD;  Location: [facility];  Service: Urology;  Laterality:  N/A;   TOTAL HIP ARTHROPLASTY Left 11/19/2015   Procedure: LEFT TOTAL HIP ARTHROPLASTY ANTERIOR APPROACH;  Surgeon: Arnie Lao, MD;  Location: [facility];  Service: Orthopedics;  Laterality: Left;   TRANSURETHRAL RESECTION OF PROSTATE N/A 09/24/2023   Procedure: TRANSURETHRAL RESECTION OF THE PROSTATE (TURP);  Surgeon: Marco Severs, MD;  [facility];  Service: Urology;  Laterality: N/A;   VENTRAL HERNIA REPAIR N/A 04/11/2017   Procedure: LAPAROSCOPIC INCARCERATED PERISTOMAL AND INCARCERATED INCISIONAL HERNIA X2;  Surgeon: Candyce Champagne, MD;  Location: [facility];  Service: General;  Laterality: N/A;  BILATERAL TAP BLOCK   Family History  Problem Relation Age of Onset   Colon cancer Sister    Emphysema Mother    Heart attack Brother    Colon polyps Neg Hx    Social History   Socioeconomic History   Marital status: Married    Spouse name: Not on file   Number of children: Not on file   Years of education: Not on file   Highest education level: Not on file  Occupational History   Not on file  Tobacco Use   Smoking status: Former    Types: Cigars     Quit date: 2012    Years since quitting: 13.3   Smokeless tobacco: Never  Vaping Use   Vaping status: Never Used  Substance and Sexual Activity   Alcohol use: No    Comment: quit years ago   Drug use: No   Sexual activity: Not on file  Other Topics Concern   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   Social Drivers of Corporate investment banker Strain: Low Risk  (02/14/2022)   Overall Financial Resource Strain (CARDIA)    Difficulty of Paying Living Expenses: Not hard at all  Food Insecurity: No Food Insecurity (09/24/2023)   Hunger Vital Sign    Worried About Running Out of Food in the Last Year: Never true    Ran Out of Food in the Last Year: Never true  Transportation Needs: No Transportation Needs (09/24/2023)   PRAPARE - Administrator, Civil Service (Medical): No    Lack of Transportation (Non-Medical): No  Physical Activity: Sufficiently Active (02/14/2022)   Exercise Vital Sign    Days of Exercise per Week: 5 days    Minutes of Exercise per Session: 30 min  Stress: No Stress Concern Present (02/14/2022)   [employer] of Occupational Health - Occupational Stress Questionnaire    Feeling of Stress : Not at all  Social Connections: Socially Integrated (09/24/2023)   Social Connection and Isolation Panel [NHANES]    Frequency of Communication with Friends and Family: More than three times a week    Frequency of Social Gatherings with Friends and Family: Not on file    Attends Religious Services: More than 4 times per year    Active Member of [employer] or Organizations: Not on file    Attends Banker Meetings: More than 4 times per year    Marital Status: Married  Catering manager Violence: Not At Risk (09/24/2023)   Humiliation, Afraid, Rape, and Kick questionnaire    Fear of Current or Ex-Partner: No    Emotionally Abused: No    Physically Abused: No    Sexually Abused: No    Review of Systems Constitutional:  Patient denies any unintentional weight loss or change in strength lntegumentary: Patient denies any rashes or pruritus Cardiovascular: Patient  denies chest pain or syncope Respiratory: Patient denies shortness of breath Musculoskeletal: Patient denies muscle cramps or weakness Neurologic: Patient denies convulsions or seizures Allergic/Immunologic: Patient denies recent allergic reaction(s) Hematologic/Lymphatic: Patient denies bleeding tendencies Endocrine: Patient denies heat/cold intolerance  GU: As per HPI.  OBJECTIVE Vitals:   12/31/23 0940  BP: (!) 143/83  Pulse: 67   There is no height or weight on file to calculate BMI.  Physical Examination Constitutional: No obvious distress; patient is non-toxic appearing  Cardiovascular: No visible lower extremity edema.  Respiratory: The patient does not have audible wheezing/stridor; respirations do not appear labored  Gastrointestinal: Abdomen non-distended Musculoskeletal: Normal ROM of UEs  Skin: No obvious rashes/open sores  Neurologic: CN 2-12 grossly intact Psychiatric: Answered questions appropriately with normal affect  Hematologic/Lymphatic/Immunologic: No obvious bruises or sites of spontaneous bleeding  UA: trace protein; otherwise unremarkable  ASSESSMENT Benign prostatic hyperplasia with weak urinary stream - Plan: Urinalysis, Routine w reflex microscopic, BLADDER SCAN AMB NON-IMAGING, PSA  Kidney stones - Plan: Urinalysis, Routine w reflex microscopic, BLADDER SCAN AMB NON-IMAGING, DG Abd 1 View, CANCELED: DG Abd 1 View  S/P TURP - Plan: Urinalysis, Routine w reflex microscopic, BLADDER SCAN AMB NON-IMAGING, PSA  Erectile dysfunction, unspecified erectile dysfunction type  Erectile dysfunction due to arterial insufficiency - Plan: tadalafil  (CIALIS ) 20 MG tablet, DISCONTINUED: tadalafil  (CIALIS ) 5 MG tablet   1. BPH. Doing well. PSA recheck today.  2. Kidney stones. Asymptomatic. Advised KUB in 1 year for  stone surveillance.   3. Erectile dysfunction. Not bothersome per patient. Well managed on current treatment regimen. Refills sent for Tadalafil  20 mg PRN.  We agreed to plan for follow up in 1 year or sooner if needed. Patient verbalized understanding of and agreement with current plan. All questions were answered.  PLAN Advised the following: 1. PSA today. 2. Tadalafil  20 mg PRN.  3. Return in about 1 year (around 12/30/2024) for KUB, UA, PVR, & f/u with Griselda Lederer NP.  Orders Placed This Encounter  Procedures   DG Abd 1 View    Standing Status:   Future    Expected Date:   07/02/2024    Expiration Date:   12/30/2024    Reason for Exam (SYMPTOM  OR DIAGNOSIS REQUIRED):   kidney stone    Preferred imaging location?:   [facility]   Urinalysis, Routine w reflex microscopic   PSA   BLADDER SCAN AMB NON-IMAGING    It has been explained that the patient is to follow regularly with their PCP in addition to all other providers involved in their care and to follow instructions provided by these respective offices. Patient advised to contact urology clinic if any urologic-pertaining questions, concerns, new symptoms or problems arise in the interim period.  There are no Patient Instructions on file for this visit.  Electronically signed by:  Lauretta Ponto, FNP   12/31/23    11:22 AM

## 2023-12-31 NOTE — Telephone Encounter (Signed)
 Patient is made aware to get KUB prior to appointment with Griselda Lederer, FNP today.

## 2024-01-01 LAB — PSA: Prostate Specific Ag, Serum: 1.9 ng/mL (ref 0.0–4.0)

## 2024-01-18 ENCOUNTER — Other Ambulatory Visit: Payer: Self-pay | Admitting: Family Medicine

## 2024-01-21 ENCOUNTER — Telehealth: Payer: Self-pay

## 2024-01-21 NOTE — Telephone Encounter (Signed)
 Patient was aware and voiced understanding.

## 2024-01-21 NOTE — Telephone Encounter (Signed)
-----   Message from Lauretta Ponto sent at 01/10/2024  5:31 PM EDT ----- Please let pt know his insurance denied Cialis  (Tadalafil ) coverage. ----- Message ----- From: Audra Blend, CMA Sent: 01/02/2024   9:23 AM EDT To: Lauretta Ponto, FNP  Please see attachment and advised

## 2024-01-25 ENCOUNTER — Ambulatory Visit: Payer: No Typology Code available for payment source | Attending: Cardiology | Admitting: Cardiology

## 2024-01-25 ENCOUNTER — Encounter: Payer: Self-pay | Admitting: Cardiology

## 2024-01-25 VITALS — BP 122/84 | HR 82 | Ht 65.0 in | Wt 186.8 lb

## 2024-01-25 DIAGNOSIS — E782 Mixed hyperlipidemia: Secondary | ICD-10-CM | POA: Diagnosis not present

## 2024-01-25 DIAGNOSIS — I10 Essential (primary) hypertension: Secondary | ICD-10-CM | POA: Diagnosis not present

## 2024-01-25 DIAGNOSIS — R0609 Other forms of dyspnea: Secondary | ICD-10-CM | POA: Diagnosis not present

## 2024-01-25 NOTE — Progress Notes (Signed)
    Cardiology Office Note  Date: 01/25/2024   ID: [name], DOB Jul 21, 1956, MRN [id number]  History of Present Illness: [name] is a 68 y.o. male last seen in November 2024.  He is here for a routine visit.  In the interim he underwent surgical repair of parastomal hernia complicated by hematoma and ileus.  Subsequently also underwent TURP.  He has recovered at this point and states that he is back to baseline in terms of activity.  NYHA class II dyspnea no exertional chest pain.  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  We went over his medications.  He reports compliance with medications and continues to follow with Dr. Debrah Fan.  I reviewed his interval lab work.  Physical Exam: VS:  BP 122/84   Pulse 82   Ht 5\' 5"  (1.651 m)   Wt 186 lb 12.8 oz (84.7 kg)   SpO2 98%   BMI 31.09 kg/m , BMI Body mass index is 31.09 kg/m.  Wt Readings from Last 3 Encounters:  01/25/24 186 lb 12.8 oz (84.7 kg)  11/08/23 192 lb 9.6 oz (87.4 kg)  10/03/23 187 lb (84.8 kg)    General: Patient appears comfortable at rest. HEENT: Conjunctiva and lids normal. Neck: Supple, no elevated JVP or carotid bruits. Lungs: Clear to auscultation, nonlabored breathing at rest. Cardiac: Regular rate and rhythm, no S3 or significant systolic murmur.  ECG:  An ECG dated 05/24/2023 was personally reviewed today and demonstrated:  Sinus rhythm with prolonged PR interval, Q-wave in lead III, decreased R wave progression.  Labwork: 04/10/2023: ALT 18; AST 15 09/25/2023: BUN 17; Creatinine, Ser 0.82; Hemoglobin 11.4; Platelets 373; Potassium 3.7; Sodium 136     Component Value Date/Time   CHOL 140 04/10/2023 0943   TRIG 114 04/10/2023 0943   HDL 40 04/10/2023 0943   CHOLHDL 3.5 04/10/2023 0943   LDLCALC 79 04/10/2023 0943   Other Studies Reviewed Today:  No interval cardiac testing for review today.  Assessment and Plan:  1.  Dyspnea on exertion, NYHA class II symptoms and no  exertional chest discomfort.  Back to baseline activity after 2 interval surgeries as discussed above.  Cardiac structural and ischemic testing from May 2024 was overall low risk.  Plan to continue observation unless symptoms worsen and otherwise continue risk factor reduction.   2.  Mixed hyperlipidemia.  LDL 79 in August 2024.  Continue Crestor  10 mg daily.   3.  Primary hypertension.  Continue chlorthalidone  12.5 mg daily, Lopressor  25 mg twice daily, and Norvasc  5 mg daily.  Disposition:  Follow up 1 year.  Signed, Gerard Knight, M.D., F.A.C.C. [facility]

## 2024-01-25 NOTE — Patient Instructions (Signed)
 Medication Instructions:  Your physician recommends that you continue on your current medications as directed. Please refer to the Current Medication list given to you today.  *If you need a refill on your cardiac medications before your next appointment, please call your pharmacy*  Lab Work: NONE   If you have labs (blood work) drawn today and your tests are completely normal, you will receive your results only by: MyChart Message (if you have MyChart) OR A paper copy in the mail If you have any lab test that is abnormal or we need to change your treatment, we will call you to review the results.  Testing/Procedures: NONE   Follow-Up: At [facility], you and your health needs are our priority.  As part of our continuing mission to provide you with exceptional heart care, our providers are all part of one team.  This team includes your primary Cardiologist (physician) and Advanced Practice Providers or APPs (Physician Assistants and Nurse Practitioners) who all work together to provide you with the care you need, when you need it.  Your next appointment:   1 year(s)  Provider:   You may see Teddie Favre, MD or one of the following Advanced Practice Providers on your designated Care Team:   Woodfin Hays, PA-C  [place], [place] Theotis Flake, [place]     We recommend signing up for the patient portal called "MyChart".  Sign up information is provided on this After Visit Summary.  MyChart is used to connect with patients for Virtual Visits (Telemedicine).  Patients are able to view lab/test results, encounter notes, upcoming appointments, etc.  Non-urgent messages can be sent to your provider as well.   To learn more about what you can do with MyChart, go to [email or web address].   Other Instructions Thank you for choosing [facility]!

## 2024-02-02 ENCOUNTER — Other Ambulatory Visit: Payer: Self-pay | Admitting: Family Medicine

## 2024-02-04 ENCOUNTER — Other Ambulatory Visit: Payer: Self-pay

## 2024-02-04 MED ORDER — PANTOPRAZOLE SODIUM 40 MG PO TBEC: 40.0000 mg | DELAYED_RELEASE_TABLET | Freq: Two times a day (BID) | ORAL | 1 refills | Status: DC

## 2024-03-07 ENCOUNTER — Ambulatory Visit: Admitting: Urology

## 2024-03-07 DIAGNOSIS — Z433 Encounter for attention to colostomy: Secondary | ICD-10-CM | POA: Diagnosis not present

## 2024-03-26 DIAGNOSIS — Z433 Encounter for attention to colostomy: Secondary | ICD-10-CM | POA: Diagnosis not present

## 2024-04-01 ENCOUNTER — Ambulatory Visit: Payer: No Typology Code available for payment source | Admitting: Family Medicine

## 2024-04-01 VITALS — BP 138/76 | HR 53 | Temp 97.3°F | Ht 65.0 in | Wt 185.0 lb

## 2024-04-01 DIAGNOSIS — R1909 Other intra-abdominal and pelvic swelling, mass and lump: Secondary | ICD-10-CM | POA: Diagnosis not present

## 2024-04-01 DIAGNOSIS — I10 Essential (primary) hypertension: Secondary | ICD-10-CM | POA: Diagnosis not present

## 2024-04-01 DIAGNOSIS — L729 Follicular cyst of the skin and subcutaneous tissue, unspecified: Secondary | ICD-10-CM | POA: Diagnosis not present

## 2024-04-01 DIAGNOSIS — R7309 Other abnormal glucose: Secondary | ICD-10-CM

## 2024-04-01 DIAGNOSIS — N400 Enlarged prostate without lower urinary tract symptoms: Secondary | ICD-10-CM | POA: Diagnosis not present

## 2024-04-01 DIAGNOSIS — D649 Anemia, unspecified: Secondary | ICD-10-CM | POA: Diagnosis not present

## 2024-04-01 DIAGNOSIS — E785 Hyperlipidemia, unspecified: Secondary | ICD-10-CM | POA: Diagnosis not present

## 2024-04-01 MED ORDER — AMLODIPINE BESYLATE 5 MG PO TABS: 5.0000 mg | ORAL_TABLET | Freq: Every day | ORAL | 3 refills | Status: AC

## 2024-04-01 NOTE — Assessment & Plan Note (Signed)
 Appears benign and appears to be in the soft tissue.  No evidence of hernia.  Advised to monitor closely.

## 2024-04-01 NOTE — Assessment & Plan Note (Signed)
 Has been stable.  Lipid panel to assess.  Continue Crestor .

## 2024-04-01 NOTE — Patient Instructions (Signed)
Labs today.  Continue your medications.  Follow up in 6 months.

## 2024-04-01 NOTE — Assessment & Plan Note (Signed)
 Benign.  Advised close monitoring.

## 2024-04-01 NOTE — Assessment & Plan Note (Signed)
 Stable.  Continue amlodipine , chlorthalidone , and metoprolol .  Amlodipine  refilled today.

## 2024-04-01 NOTE — Progress Notes (Signed)
 Subjective:  Patient ID: [name], male    DOB: 06/10/1956  Age: 68 y.o. MRN: [id number]  CC:   Chief Complaint  Patient presents with   Follow-up    6 month f/u Benign prostatic hyperplasia, hypertension     HPI:  68 year old male presents for follow-up.  BP stable on amlodipine , chlorthalidone , and metoprolol .  Following closely with urology.  Recent TURP.  Patient states that overall he is doing well.  He does note that he has a raised area on his upper back that bothers him from time to time.  He states that his wife often squeezes the area to get contents out.  Patient also reports that he has a slightly firm area in the left inguinal region.  Appears to be under the skin.  No reported bulging that is consistent with hernia.  Last lipid panel was fairly well-controlled with an LDL of 79.  Needs lipid panel today.  Patient Active Problem List   Diagnosis Date Noted   Skin cyst 04/01/2024   Inguinal mass 04/01/2024   Kidney stones 01/23/2023   Erectile dysfunction 01/23/2023   Benign prostatic hyperplasia 01/23/2023   DOE (dyspnea on exertion) 10/10/2022   Hyperlipidemia 09/14/2021   Colostomy in place [facility])    Essential hypertension, benign 05/12/2014   Obesity (BMI 30-39.9) 03/24/2014   Rectal cancer, uT2uN0, s/p LAR/ileostomy 03/20/2014 colostomy Aug2015 12/25/2013   GERD (gastroesophageal reflux disease) 11/27/2013    Social Hx   Social History   Socioeconomic History   Marital status: Married    Spouse name: Not on file   Number of children: Not on file   Years of education: Not on file   Highest education level: Not on file  Occupational History   Not on file  Tobacco Use   Smoking status: Former    Types: Cigars    Quit date: 2012    Years since quitting: 13.6   Smokeless tobacco: Never  Vaping Use   Vaping status: Never Used  Substance and Sexual Activity   Alcohol use: No    Comment: quit years ago   Drug use: No   Sexual activity:  Not on file  Other Topics Concern   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   Social Drivers of Corporate investment banker Strain: Low Risk  (02/14/2022)   Overall Financial Resource Strain (CARDIA)    Difficulty of Paying Living Expenses: Not hard at all  Food Insecurity: No Food Insecurity (09/24/2023)   Hunger Vital Sign    Worried About Running Out of Food in the Last Year: Never true    Ran Out of Food in the Last Year: Never true  Transportation Needs: No Transportation Needs (09/24/2023)   PRAPARE - Administrator, Civil Service (Medical): No    Lack of Transportation (Non-Medical): No  Physical Activity: Sufficiently Active (02/14/2022)   Exercise Vital Sign    Days of Exercise per Week: 5 days    Minutes of Exercise per Session: 30 min  Stress: No Stress Concern Present (02/14/2022)   [employer] of Occupational Health - Occupational Stress Questionnaire    Feeling of Stress : Not at all  Social Connections: Socially Integrated (09/24/2023)   Social Connection and Isolation Panel    Frequency of Communication with Friends and Family: More than three times a week    Frequency of Social Gatherings with Friends and Family: Not on file  Attends Religious Services: More than 4 times per year    Active Member of Clubs or Organizations: Not on file    Attends Banker Meetings: More than 4 times per year    Marital Status: Married    Review of Systems Per HPI  Objective:  BP 138/76   Pulse (!) 53   Temp (!) 97.3 F (36.3 C)   Ht 5' 5 (1.651 m)   Wt 185 lb (83.9 kg)   SpO2 95%   BMI 30.79 kg/m      04/01/2024    9:19 AM 04/01/2024    8:44 AM 01/25/2024    8:11 AM  BP/Weight  Systolic BP 138 143 122  Diastolic BP 76 81 84  Wt. (Lbs)  185 186.8  BMI  30.79 kg/m2 31.09 kg/m2    Physical Exam Vitals and nursing note reviewed.  Constitutional:      General: He is not in acute distress.     Appearance: Normal appearance.  HENT:     Head: Normocephalic and atraumatic.  Cardiovascular:     Rate and Rhythm: Normal rate and regular rhythm.  Pulmonary:     Effort: Pulmonary effort is normal.     Breath sounds: Normal breath sounds. No wheezing, rhonchi or rales.  Genitourinary:    Comments: Left inguinal region with a small and slightly firm area underneath the skin.  Suspected cyst versus lipoma.  No appreciable hernia. Skin:    Comments: Midline of the upper back with a firm mobile area that is consistent with a benign cyst.  No current erythema or warmth to suggest underlying infection.  Neurological:     Mental Status: He is alert.     Lab Results  Component Value Date   WBC 13.5 (H) 09/25/2023   HGB 11.4 (L) 09/25/2023   HCT 36.0 (L) 09/25/2023   PLT 373 09/25/2023   GLUCOSE 162 (H) 09/25/2023   CHOL 140 04/10/2023   TRIG 114 04/10/2023   HDL 40 04/10/2023   LDLCALC 79 04/10/2023   ALT 18 04/10/2023   AST 15 04/10/2023   NA 136 09/25/2023   K 3.7 09/25/2023   CL 101 09/25/2023   CREATININE 0.82 09/25/2023   BUN 17 09/25/2023   CO2 25 09/25/2023   TSH 2.029 10/18/2018   INR 0.99 12/25/2013   HGBA1C 5.7 (H) 04/10/2023     Assessment & Plan:  Essential hypertension, benign Assessment & Plan: Stable.  Continue amlodipine , chlorthalidone , and metoprolol .  Amlodipine  refilled today.  Orders: -     amLODIPine  Besylate; Take 1 tablet (5 mg total) by mouth daily.  Dispense: 90 tablet; Refill: 3 -     CMP14+EGFR  Anemia, unspecified type -     CBC  Elevated glucose -     Hemoglobin A1c  Hyperlipidemia, unspecified hyperlipidemia type Assessment & Plan: Has been stable.  Lipid panel to assess.  Continue Crestor .  Orders: -     Lipid panel  Benign prostatic hyperplasia, unspecified whether lower urinary tract symptoms present Assessment & Plan: Doing well status post recent TURP.   Skin cyst Assessment & Plan: Benign.  Advised close  monitoring.   Inguinal mass Assessment & Plan: Appears benign and appears to be in the soft tissue.  No evidence of hernia.  Advised to monitor closely.     Follow-up: 6 months  Mirayah Wren Bluford DO [facility]

## 2024-04-01 NOTE — Assessment & Plan Note (Signed)
 Doing well status post recent TURP.

## 2024-04-02 ENCOUNTER — Ambulatory Visit: Payer: Self-pay | Admitting: Family Medicine

## 2024-04-02 LAB — CMP14+EGFR
ALT: 23 IU/L (ref 0–44)
AST: 14 IU/L (ref 0–40)
Albumin: 4 g/dL (ref 3.9–4.9)
Alkaline Phosphatase: 97 IU/L (ref 44–121)
BUN/Creatinine Ratio: 19 (ref 10–24)
BUN: 18 mg/dL (ref 8–27)
Bilirubin Total: 0.3 mg/dL (ref 0.0–1.2)
CO2: 23 mmol/L (ref 20–29)
Calcium: 9.3 mg/dL (ref 8.6–10.2)
Chloride: 104 mmol/L (ref 96–106)
Creatinine, Ser: 0.96 mg/dL (ref 0.76–1.27)
Globulin, Total: 2.2 g/dL (ref 1.5–4.5)
Glucose: 75 mg/dL (ref 70–99)
Potassium: 4.1 mmol/L (ref 3.5–5.2)
Sodium: 142 mmol/L (ref 134–144)
Total Protein: 6.2 g/dL (ref 6.0–8.5)
eGFR: 87 mL/min/1.73 (ref >59)

## 2024-04-02 LAB — CBC
Hematocrit: 45.2 % (ref 37.5–51.0)
Hemoglobin: 14 g/dL (ref 13.0–17.7)
MCH: 27.4 pg (ref 26.6–33.0)
MCHC: 31 g/dL — ABNORMAL LOW (ref 31.5–35.7)
MCV: 89 fL (ref 79–97)
Platelets: 356 x10E3/uL (ref 150–450)
RBC: 5.11 x10E6/uL (ref 4.14–5.80)
RDW: 14.5 % (ref 11.6–15.4)
WBC: 7.7 x10E3/uL (ref 3.4–10.8)

## 2024-04-02 LAB — LIPID PANEL
Chol/HDL Ratio: 3 ratio (ref 0.0–5.0)
Cholesterol, Total: 118 mg/dL (ref 100–199)
HDL: 40 mg/dL (ref >39)
LDL Chol Calc (NIH): 59 mg/dL (ref 0–99)
Triglycerides: 98 mg/dL (ref 0–149)
VLDL Cholesterol Cal: 19 mg/dL (ref 5–40)

## 2024-04-02 LAB — HEMOGLOBIN A1C
Est. average glucose Bld gHb Est-mCnc: 120 mg/dL
Hgb A1c MFr Bld: 5.8 % — ABNORMAL HIGH (ref 4.8–5.6)

## 2024-04-13 ENCOUNTER — Ambulatory Visit
Admission: EM | Admit: 2024-04-13 | Discharge: 2024-04-13 | Disposition: A | Discharge status: Home / Self Care | Attending: Family Medicine | Admitting: Family Medicine

## 2024-04-13 ENCOUNTER — Other Ambulatory Visit: Payer: Self-pay

## 2024-04-13 DIAGNOSIS — R0789 Other chest pain: Secondary | ICD-10-CM

## 2024-04-13 DIAGNOSIS — S29011A Strain of muscle and tendon of front wall of thorax, initial encounter: Secondary | ICD-10-CM | POA: Diagnosis not present

## 2024-04-13 MED ORDER — TIZANIDINE HCL 4 MG PO CAPS: 4.0000 mg | ORAL_CAPSULE | Freq: Three times a day (TID) | ORAL | 0 refills | Status: DC | PRN

## 2024-04-13 MED ORDER — NAPROXEN 500 MG PO TABS: 500.0000 mg | ORAL_TABLET | Freq: Two times a day (BID) | ORAL | 0 refills | Status: DC | PRN

## 2024-04-13 NOTE — ED Triage Notes (Signed)
 Pt reports right shoulder pain, chest soreness x 1 week, feels like and elephant sitting on his chest, tingling all the way down into his hand. Started to worsen today. Denies injury, but has been working with heavy equipment while at work.

## 2024-04-13 NOTE — Discharge Instructions (Signed)
 Your workup is very reassuring today, I suspect a muscular strain specifically in the pectoral muscle of the right side of the chest.  I have prescribed a muscle relaxer and anti-inflammatory pain medication and you may use heat, massage, stretches, rest, gentle range of motion exercises as able.  Return for worsening symptoms.

## 2024-04-13 NOTE — ED Provider Notes (Signed)
 [facility]    CSN: [id number] Arrival date & time: 04/13/24  1103      History   Chief Complaint No chief complaint on file.   HPI [name] is a 68 y.o. male.   Patient presenting today with about a week of right sided chest wall pain radiating toward the shoulder worse with movements.  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  Denies associated wheezing, shortness of breath, palpitations, dizziness, abdominal pain, nausea vomiting or diarrhea, swelling, weakness, numbness, tingling.  Tried some ibuprofen with minimal relief.    Past Medical History:  Diagnosis Date   Arthritis    hands and shoulders (left shoulder is worse than right)   Aspiration pneumonia (HCC) 03/24/2014   after surgery for colostomy   BPH (benign prostatic hyperplasia)    Colostomy in place [facility])    Complication of anesthesia 03/24/2014   aspiration pneumonia after surgery for colostomy, no problem with  surgeries since then   Difficulty urinating    Dyspnea    @ times due to hernias, seeing [facility] for  surgical consultation 04-20-2022, can lie flat   GERD (gastroesophageal reflux disease)    History of kidney stones    Hypertension    Ileostomy present (HCC)    Incisional hernia    right abdomen   Parastomal hernia left    Rectal cancer, uT2uN0, s/p LAR/ileostomy 03/20/2014 colostomy Aug2015 12/25/2013   TCS MAY 2015    Restless leg    Right inguinal hernia    Shortening, leg, congenital    Left   Skin irritation    Surrounding ostomy site    Patient Active Problem List   Diagnosis Date Noted   Skin cyst 04/01/2024   Inguinal mass 04/01/2024   Kidney stones 01/23/2023   Erectile dysfunction 01/23/2023   Benign prostatic hyperplasia 01/23/2023   DOE (dyspnea on exertion) 10/10/2022   Hyperlipidemia 09/14/2021   Colostomy in place [facility])    Essential hypertension, benign 05/12/2014   Obesity (BMI 30-39.9) 03/24/2014    Rectal cancer, uT2uN0, s/p LAR/ileostomy 03/20/2014 colostomy Aug2015 12/25/2013   GERD (gastroesophageal reflux disease) 11/27/2013    Past Surgical History:  Procedure Laterality Date   COLON RESECTION N/A 03/24/2014   Procedure: Diagnostic  laparotomy, takedown of coloanal anastamosis, end colostomy, with wound vac;  Surgeon: Elspeth KYM Schultze, MD;  Location: [facility];  Service: General;  Laterality: N/A;   COLONOSCOPY N/A 12/22/2013   SLF: 1. Normal mucosa in the terminal ilium. 2. Moderate diverticulosis in the transverse colon, descending colon, and sigmoid colon. 3. Rectal bleeding due ot rectal mass and hemorrhoids 4. Moderate sized internal hemorrhoids.    COLONOSCOPY N/A 01/19/2015   Procedure: COLONOSCOPY;  Surgeon: Margo LITTIE Haddock, MD;  [facility];  Service: Endoscopy;  Laterality: N/A;  1215   COLONOSCOPY N/A 03/22/2018   Procedure: COLONOSCOPY;  Surgeon: Haddock Margo LITTIE, MD;  [facility];  Service: Endoscopy;  Laterality: N/A;  2:15pm   COLONOSCOPY WITH PROPOFOL  N/A 05/28/2023   Procedure: COLONOSCOPY WITH PROPOFOL ;  Surgeon: Cindie Carlin POUR, DO;  [facility];  Service: Endoscopy;  Laterality: N/A;  1:00 pm, asa 3   COLOSTOMY     03-24-2014   CYSTOSCOPY N/A 09/24/2023   Procedure: CYSTOSCOPY;  Surgeon: Sherrilee Belvie LITTIE, MD;  Location: [facility];  Service: Urology;  Laterality: N/A;   ESOPHAGOGASTRODUODENOSCOPY N/A 12/22/2013   SLF: Probable Candia esophagitis2. Probable  Barretts esphagus 3. Mild gastritis.    EUS N/A 01/01/2014   Procedure: LOWER ENDOSCOPIC ULTRASOUND (EUS);  Surgeon: Toribio SHAUNNA Cedar, MD;  Location: [facility];  Service: Endoscopy;  Laterality: N/A;   EXAMINATION UNDER ANESTHESIA  08/25/2014   Procedure: EXAM UNDER ANESTHESIA;  Surgeon: Elspeth Schultze, MD;  [facility]: [facility];  Service: General;;   EXTRACORPOREAL SHOCK WAVE LITHOTRIPSY Right 07/25/2022   Procedure: EXTRACORPOREAL SHOCK WAVE LITHOTRIPSY (ESWL);  Surgeon: Sherrilee Belvie CROME, MD;  Location: [facility];  Service: Urology;  Laterality: Right;   eye reattachment and ear reattachment  Right 08/21/1969   FLEXIBLE SIGMOIDOSCOPY N/A 03/24/2014   Procedure: FLEXIBLE SIGMOIDOSCOPY;  Surgeon: Elspeth KYM Schultze, MD;  [facility]: [facility];  Service: General;  Laterality: N/A;   HIP FRACTURE SURGERY Left age 6 on 04-06-1970   ILEOSTOMY     ILEOSTOMY CLOSURE N/A 08/25/2014   Procedure: TAKEDOWN OF LOOP ILEOSTOMY ;  Surgeon: Elspeth Schultze, MD;  Location: [facility];  Service: General;  Laterality: N/A;   INSERTION OF MESH N/A 04/11/2017   Procedure: INSERTION OF MESH ;  Surgeon: Schultze Elspeth, MD;  [facility];  Service: General;  Laterality: N/A;  BILATERAL TAP BLOCK   POLYPECTOMY  05/28/2023   Procedure: POLYPECTOMY;  Surgeon: Cindie Carlin POUR, DO;  [facility];  Service: Endoscopy;;   THULIUM LASER TURP (TRANSURETHRAL RESECTION OF PROSTATE) N/A 04/04/2022   Procedure: THULIUM LASER TURP (TRANSURETHRAL RESECTION OF PROSTATE);  Surgeon: Nieves Cough, MD;  Location: [facility];  Service: Urology;  Laterality: N/A;   TOTAL HIP ARTHROPLASTY Left 11/19/2015   Procedure: LEFT TOTAL HIP ARTHROPLASTY ANTERIOR APPROACH;  Surgeon: Lonni CINDERELLA Poli, MD;  [facility];  Service: Orthopedics;  Laterality: Left;   TRANSURETHRAL RESECTION OF PROSTATE N/A 09/24/2023   Procedure: TRANSURETHRAL RESECTION OF THE PROSTATE (TURP);  Surgeon: Sherrilee Belvie CROME, MD;  Location: [facility];  Service: Urology;  Laterality: N/A;   VENTRAL HERNIA REPAIR N/A 04/11/2017   Procedure: LAPAROSCOPIC INCARCERATED PERISTOMAL AND INCARCERATED INCISIONAL HERNIA X2;  Surgeon: Schultze Elspeth, MD;  Location: [facility];  Service: General;  Laterality: N/A;  BILATERAL TAP BLOCK       Home Medications    Prior to Admission medications   Medication Sig Start Date End Date Taking? Authorizing Provider  naproxen  (NAPROSYN ) 500 MG tablet Take 1 tablet (500 mg total) by mouth 2 (two)  times daily as needed. 04/13/24  Yes Stuart Vernell Norris, PA-C  tiZANidine  (ZANAFLEX ) 4 MG capsule Take 1 capsule (4 mg total) by mouth 3 (three) times daily as needed for muscle spasms. Do not drink alcohol or drive while taking this medication.  May cause drowsiness. 04/13/24  Yes Stuart Vernell Norris, PA-C  amLODipine  (NORVASC ) 5 MG tablet Take 1 tablet (5 mg total) by mouth daily. 04/01/24   Cook, Jayce G, DO  APPLE CIDER VINEGAR PO Take 1 tablet by mouth in the morning and at bedtime.    [provider]  aspirin  EC 81 MG tablet Take 81 mg by mouth daily.    [provider]  baclofen  (LIORESAL ) 10 MG tablet TAKE 1 TABLET (10 MG TOTAL) BY MOUTH AT BEDTIME AS NEEDED FOR MUSCLE SPASMS. FOR LEG CRAMPING 01/18/24   Cook, Jayce G, DO  Beta Carotene (VITAMIN A) 25000 UNIT capsule Take 25,000 Units by mouth daily.    [provider]  chlorthalidone  (HYGROTON ) 25 MG tablet Take 0.5 tablets (12.5 mg total) by mouth daily. 07/11/23 04/01/24  Debera Savant  G, MD  co-enzyme Q-10 30 MG capsule Take 30 mg by mouth daily.    [provider]  metoprolol  tartrate (LOPRESSOR ) 25 MG tablet Take 1 tablet (25 mg total) by mouth 2 (two) times daily. 04/10/23   Cook, Jayce G, DO  Multiple Vitamins-Minerals (PRESERVISION AREDS 2 PO) Take 1 capsule by mouth in the morning and at bedtime.    [provider]  pantoprazole  (PROTONIX ) 40 MG tablet Take 1 tablet (40 mg total) by mouth 2 (two) times daily before a meal. 02/04/24   Cook, Jayce G, DO  rosuvastatin  (CRESTOR ) 10 MG tablet Take 1 tablet (10 mg total) by mouth daily. 12/05/23   Cook, Jayce G, DO  silver  sulfADIAZINE  (SILVADENE ) 1 % cream Apply 1 Application topically daily. 11/08/23   Cook, Jayce G, DO  Turmeric 500 MG CAPS Take 500 mg by mouth 2 (two) times daily.     [provider]  vitamin B-12 (CYANOCOBALAMIN ) 500 MCG tablet Take 1,000 mcg by mouth daily.    [provider]  vitamin C  (ASCORBIC ACID )  500 MG tablet Take 500 mg by mouth 2 (two) times daily.    [provider]    Family History Family History  Problem Relation Age of Onset   Colon cancer Sister    Emphysema Mother    Heart attack Brother    Colon polyps Neg Hx     Social History Social History   Tobacco Use   Smoking status: Former    Types: Cigars    Quit date: 2012    Years since quitting: 13.6   Smokeless tobacco: Never  Vaping Use   Vaping status: Never Used  Substance Use Topics   Alcohol use: No    Comment: quit years ago   Drug use: No     Allergies   Patient has no known allergies.   Review of Systems Review of Systems Per HPI  Physical Exam Triage Vital Signs ED Triage Vitals [04/13/24 1112]  Encounter Vitals Group     BP 134/89     Girls Systolic BP Percentile      Girls Diastolic BP Percentile      Boys Systolic BP Percentile      Boys Diastolic BP Percentile      Pulse Rate 68     Resp 20     Temp 98.4 F (36.9 C)     Temp src      SpO2 93 %     Weight      Height      Head Circumference      Peak Flow      Pain Score 7     Pain Loc      Pain Education      Exclude from Growth Chart    No data found.  Updated Vital Signs BP 134/89 (BP Location: Right Arm)   Pulse 68   Temp 98.4 F (36.9 C)   Resp 20   SpO2 93%   Visual Acuity Right Eye Distance:   Left Eye Distance:   Bilateral Distance:    Right Eye Near:   Left Eye Near:    Bilateral Near:     Physical Exam Vitals and nursing note reviewed.  Constitutional:      Appearance: Normal appearance.  HENT:     Head: Atraumatic.     Mouth/Throat:     Mouth: Mucous membranes are moist.  Eyes:     Extraocular Movements: Extraocular movements intact.  Conjunctiva/sclera: Conjunctivae normal.  Cardiovascular:     Rate and Rhythm: Normal rate and regular rhythm.     Heart sounds: Normal heart sounds.  Pulmonary:     Effort: Pulmonary effort is normal.     Breath sounds: Normal breath  sounds. No wheezing or rales.  Musculoskeletal:        General: Tenderness present. Normal range of motion.     Cervical back: Normal range of motion and neck supple.     Comments: Tenderness to palpation to the right pectoral muscle particularly laterally at the insertion into the right shoulder.  Pain reproduced on palpation and with active range of motion against resistance.  Grip strength full and equal bilateral hands  Skin:    General: Skin is warm and dry.  Neurological:     Mental Status: He is oriented to person, place, and time.     Motor: No weakness.     Gait: Gait normal.  Psychiatric:        Mood and Affect: Mood normal.        Thought Content: Thought content normal.        Judgment: Judgment normal.      UC Treatments / Results  Labs (all labs ordered are listed, but only abnormal results are displayed) Labs Reviewed - No data to display  EKG   Radiology No results found.  Procedures Procedures (including critical care time)  Medications Ordered in UC Medications - No data to display  Initial Impression / Assessment and Plan / UC Course  I have reviewed the triage vital signs and the nursing notes.  Pertinent labs & imaging results that were available during my care of the patient were reviewed by me and considered in my medical decision making (see chart for details).     Symptoms and history consistent with pectoral strain.  Vitals and exam very reassuring today, EKG today showing sinus bradycardia with first-degree AV block at 56 bpm with no new ST or T wave changes when compared to prior EKG which appeared very similar to this 1 in totality.  Very low suspicion for cardiopulmonary cause of symptoms.  Will treat with Zanaflex , naproxen , heat, soft, stretches, rest and discussed ED precautions if significantly worsening.  Final Clinical Impressions(s) / UC Diagnoses   Final diagnoses:  Chest wall pain  Pectoralis muscle strain, initial encounter      Discharge Instructions      Your workup is very reassuring today, I suspect a muscular strain specifically in the pectoral muscle of the right side of the chest.  I have prescribed a muscle relaxer and anti-inflammatory pain medication and you may use heat, massage, stretches, rest, gentle range of motion exercises as able.  Return for worsening symptoms.    ED Prescriptions     Medication Sig Dispense Auth. Provider   tiZANidine  (ZANAFLEX ) 4 MG capsule Take 1 capsule (4 mg total) by mouth 3 (three) times daily as needed for muscle spasms. Do not drink alcohol or drive while taking this medication.  May cause drowsiness. 15 capsule Stuart Vernell Norris, PA-C   naproxen  (NAPROSYN ) 500 MG tablet Take 1 tablet (500 mg total) by mouth 2 (two) times daily as needed. 20 tablet Stuart Vernell Norris, [place]      PDMP not reviewed this encounter.   Stuart Vernell Norris, PA-C 04/13/24 1329

## 2024-04-18 ENCOUNTER — Ambulatory Visit: Payer: Self-pay | Admitting: Family Medicine

## 2024-04-18 ENCOUNTER — Ambulatory Visit (INDEPENDENT_AMBULATORY_CARE_PROVIDER_SITE_OTHER): Admitting: Family Medicine

## 2024-04-18 ENCOUNTER — Ambulatory Visit (HOSPITAL_COMMUNITY)
Admission: RE | Admit: 2024-04-18 | Discharge: 2024-04-18 | Disposition: A | Discharge status: Home / Self Care | Source: Ambulatory Visit | Attending: Family Medicine | Admitting: Family Medicine

## 2024-04-18 ENCOUNTER — Ambulatory Visit: Payer: Self-pay

## 2024-04-18 ENCOUNTER — Telehealth: Payer: Self-pay | Admitting: Family Medicine

## 2024-04-18 VITALS — BP 136/76 | HR 60 | Temp 97.3°F | Ht 65.0 in | Wt 185.0 lb

## 2024-04-18 DIAGNOSIS — M542 Cervicalgia: Secondary | ICD-10-CM

## 2024-04-18 DIAGNOSIS — R079 Chest pain, unspecified: Secondary | ICD-10-CM | POA: Insufficient documentation

## 2024-04-18 DIAGNOSIS — M4802 Spinal stenosis, cervical region: Secondary | ICD-10-CM | POA: Diagnosis not present

## 2024-04-18 DIAGNOSIS — M436 Torticollis: Secondary | ICD-10-CM | POA: Diagnosis not present

## 2024-04-18 DIAGNOSIS — J40 Bronchitis, not specified as acute or chronic: Secondary | ICD-10-CM | POA: Diagnosis not present

## 2024-04-18 DIAGNOSIS — M47812 Spondylosis without myelopathy or radiculopathy, cervical region: Secondary | ICD-10-CM | POA: Diagnosis not present

## 2024-04-18 MED ORDER — TRAMADOL HCL 50 MG PO TABS: 50.0000 mg | ORAL_TABLET | Freq: Three times a day (TID) | ORAL | 0 refills | Status: DC | PRN

## 2024-04-18 NOTE — Patient Instructions (Signed)
 Xrays today.  I am sending in something for your pain.  If you worsen, please go to the ER.  Take care  Dr. Bluford

## 2024-04-18 NOTE — Telephone Encounter (Unsigned)
 Copied from CRM [id number]. Topic: Clinical - Medical Advice >> Apr 18, 2024 10:42 AM Tiffini S wrote: Reason for CRM: Patient is calling about [facility] from [facility]'s visit- [facility] and [facility] have not been received. Called [facility], spoke with Geni- please call patient back with update for the name of the prescription that will be sent to [facility] at [phone].

## 2024-04-18 NOTE — Telephone Encounter (Signed)
 FYI Only or Action Required?: FYI only for provider.  Patient was last seen in primary care on 04/01/2024 by Cook, Jayce G, DO.  Called Nurse Triage reporting Chest Pain.  Symptoms began a week ago.  Interventions attempted: Nothing.  Symptoms are: unchanged.  Triage Disposition: See Physician Within 24 Hours  Patient/caregiver understands and will follow disposition?: Yes   Copied from CRM [id number]. Topic: Clinical - Red Word Triage >> Apr 18, 2024  7:41 AM Willma SAUNDERS wrote: Red Word that prompted transfer to Nurse Triage: Patient is experiencing pain in his upper right chest and into his neck for the past 8-10 days. Went to the ER and was told was muscle spasms, but still having pain. Spasms also go down into right arm with a burning sensation in his right elbow. Reason for Disposition  [1] Chest pain lasts > 5 minutes AND [2] occurred > 3 days ago (72 hours) AND [3] NO chest pain or cardiac symptoms now  Answer Assessment - Initial Assessment Questions 1. LOCATION: Where does it hurt?       Right chest 2. RADIATION: Does the pain go anywhere else? (e.g., into neck, jaw, arms, back)     Into right arm with a burning sensation in his right elbow. 3. ONSET: When did the chest pain begin? 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 4. PATTERN: Does the pain come and go, or has it been constant since it started?  Does it get worse with exertion?      constant 5. DURATION: How long does it last (e.g., seconds, minutes, hours)     constant 6. SEVERITY: How bad is the pain?  (e.g., Scale 1-10; mild, moderate, or severe)     moderate 7. CARDIAC RISK FACTORS: Do you have any history of heart problems or risk factors for heart disease? (e.g., angina, prior heart attack; diabetes, high blood pressure, high cholesterol, smoker, or strong family history of  heart disease)     no 8. PULMONARY RISK FACTORS: Do you have any history of lung disease?  (e.g., blood clots in lung, asthma, emphysema, birth control pills)     no 9. CAUSE: What do you think is causing the chest pain?     muscle 10. OTHER SYMPTOMS: Do you have any other symptoms? (e.g., dizziness, nausea, vomiting, sweating, fever, difficulty breathing, cough)       no 11. PREGNANCY: Is there any chance you are pregnant? When was your last menstrual period?       no  Protocols used: Chest Pain-A-AH

## 2024-04-18 NOTE — Telephone Encounter (Signed)
 FYI Only or Action Required?: FYI only for provider.  Patient was last seen in primary care on 04/18/2024 by Cook, Jayce G, DO.  Called Nurse Triage reporting Imaging Results.  Triage Disposition: Information or Advice Only Call  Patient/caregiver understands and will follow disposition?: Yes           Copied from CRM #[id number]. Topic: Clinical - Medication Question >> Apr 18, 2024  2:13 PM Sophia H wrote: Reason for CRM: Patient is following up on medication that should've been sent in by Dr. Bluford. Looks like orders were put in for Tramadol , shows Receipt confirmed by pharmacy (04/18/2024 11:53 AM EDT)  [id number] [place], [place] - [address] [facility]   LAB RESULTS /   Patient also wants to know if results are in from Xray, does look like there is a note in chart from Dr. Bluford. Sent to NT to provide findings. Reason for Disposition  Health information question, no triage required and triager able to answer question  Answer Assessment - Initial Assessment Questions Patient called in originally to check on prescription being sent in and asked if imaging results were back from Xrays he had today. This RN read the patient Dr Jacqulyn Cook's notation:  DG Cervical Spine Complete: Result Notes   Jacqulyn KANDICE Bluford, DO 04/18/2024 11:53 AM EDT   No acute findings. Medication sent in.   Patient had no further questions and he is advised that if anything worsens to let us  know. Patient verbalized understanding.  Protocols used: Information Only Call - No Triage-A-AH

## 2024-04-19 ENCOUNTER — Other Ambulatory Visit: Payer: Self-pay | Admitting: Family Medicine

## 2024-04-19 DIAGNOSIS — I10 Essential (primary) hypertension: Secondary | ICD-10-CM

## 2024-04-22 ENCOUNTER — Other Ambulatory Visit: Payer: Self-pay

## 2024-04-22 DIAGNOSIS — R079 Chest pain, unspecified: Secondary | ICD-10-CM | POA: Insufficient documentation

## 2024-04-22 DIAGNOSIS — I10 Essential (primary) hypertension: Secondary | ICD-10-CM

## 2024-04-22 DIAGNOSIS — M542 Cervicalgia: Secondary | ICD-10-CM | POA: Insufficient documentation

## 2024-04-22 MED ORDER — METOPROLOL TARTRATE 25 MG PO TABS
25.0000 mg | ORAL_TABLET | Freq: Two times a day (BID) | ORAL | 3 refills | Status: AC
Start: 2024-04-22 — End: ?

## 2024-04-22 MED ORDER — BACLOFEN 10 MG PO TABS: 10.0000 mg | ORAL_TABLET | Freq: Every evening | ORAL | 0 refills | Status: DC | PRN

## 2024-04-22 NOTE — Progress Notes (Signed)
 Subjective:  Patient ID: [name], male    DOB: 08/24/1955  Age: 68 y.o. MRN: [id number]  CC:   Chief Complaint  Patient presents with   right side chest , shoulder amd arm pain     Fall last week     HPI:  68 year old male presents for evaluation of the above.  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  Denies fall.  Does believe that he injured himself while working.  He reports right sided chest pain and also reports right arm pain particularly of the forearm.  Pain is currently severe.  He was seen in urgent care on 8/24.  Was treated with naproxen  and Zanaflex .  Has not had significant improvement.  Patient Active Problem List   Diagnosis Date Noted   Right-sided chest pain 04/22/2024   Neck pain 04/22/2024   Skin cyst 04/01/2024   Inguinal mass 04/01/2024   Kidney stones 01/23/2023   Erectile dysfunction 01/23/2023   Benign prostatic hyperplasia 01/23/2023   DOE (dyspnea on exertion) 10/10/2022   Hyperlipidemia 09/14/2021   Colostomy in place [facility])    Essential hypertension, benign 05/12/2014   Obesity (BMI 30-39.9) 03/24/2014   Rectal cancer, uT2uN0, s/p LAR/ileostomy 03/20/2014 colostomy Aug2015 12/25/2013   GERD (gastroesophageal reflux disease) 11/27/2013    Social Hx   Social History   Socioeconomic History   Marital status: Married    Spouse name: Not on file   Number of children: Not on file   Years of education: Not on file   Highest education level: Not on file  Occupational History   Not on file  Tobacco Use   Smoking status: Former    Types: Cigars    Quit date: 2012    Years since quitting: 13.6   Smokeless tobacco: Never  Vaping Use   Vaping status: Never Used  Substance and Sexual Activity   Alcohol use: No    Comment: quit years ago   Drug use: No   Sexual activity: Not on file  Other Topics Concern   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   Social  Drivers of Corporate investment banker Strain: Low Risk  (02/14/2022)   Overall Financial Resource Strain (CARDIA)    Difficulty of Paying Living Expenses: Not hard at all  Food Insecurity: No Food Insecurity (09/24/2023)   Hunger Vital Sign    Worried About Running Out of Food in the Last Year: Never true    Ran Out of Food in the Last Year: Never true  Transportation Needs: No Transportation Needs (09/24/2023)   PRAPARE - Administrator, Civil Service (Medical): No    Lack of Transportation (Non-Medical): No  Physical Activity: Sufficiently Active (02/14/2022)   Exercise Vital Sign    Days of Exercise per Week: 5 days    Minutes of Exercise per Session: 30 min  Stress: No Stress Concern Present (02/14/2022)   [employer] of Occupational Health - Occupational Stress Questionnaire    Feeling of Stress : Not at all  Social Connections: Socially Integrated (09/24/2023)   Social Connection and Isolation Panel    Frequency of Communication with Friends and Family: More than three times a week    Frequency of Social Gatherings with Friends and Family: Not on file    Attends Religious Services: More than 4 times per year    Active Member of Clubs or  Organizations: Not on file    Attends Club or Organization Meetings: More than 4 times per year    Marital Status: Married    Review of Systems Per HPI  Objective:  BP 136/76   Pulse 60   Temp (!) 97.3 F (36.3 C)   Ht 5' 5 (1.651 m)   Wt 185 lb (83.9 kg)   SpO2 95%   BMI 30.79 kg/m      04/18/2024    9:23 AM 04/13/2024   11:12 AM 04/01/2024    9:19 AM  BP/Weight  Systolic BP 136 134 138  Diastolic BP 76 89 76  Wt. (Lbs) 185    BMI 30.79 kg/m2      Physical Exam Vitals and nursing note reviewed.  Constitutional:      Comments: Appears to be in pain.  HENT:     Head: Normocephalic and atraumatic.  Cardiovascular:     Rate and Rhythm: Normal rate and regular rhythm.  Pulmonary:     Effort: Pulmonary effort is  normal.     Breath sounds: Normal breath sounds. No wheezing, rhonchi or rales.  Neurological:     Mental Status: He is alert.     Lab Results  Component Value Date   WBC 7.7 04/01/2024   HGB 14.0 04/01/2024   HCT 45.2 04/01/2024   PLT 356 04/01/2024   GLUCOSE 75 04/01/2024   CHOL 118 04/01/2024   TRIG 98 04/01/2024   HDL 40 04/01/2024   LDLCALC 59 04/01/2024   ALT 23 04/01/2024   AST 14 04/01/2024   NA 142 04/01/2024   K 4.1 04/01/2024   CL 104 04/01/2024   CREATININE 0.96 04/01/2024   BUN 18 04/01/2024   CO2 23 04/01/2024   TSH 2.029 10/18/2018   INR 0.99 12/25/2013   HGBA1C 5.8 (H) 04/01/2024     Assessment & Plan:  Right-sided chest pain Assessment & Plan: X-ray today for further evaluation.  Tramadol  as needed for pain.  Orders: -     DG Chest 2 View  Neck pain Assessment & Plan: Concern for cervical radiculopathy.  X-ray today.  Orders: -     DG Cervical Spine Complete  Other orders -     traMADol  HCl; Take 1 tablet (50 mg total) by mouth every 8 (eight) hours as needed for moderate pain (pain score 4-6) or severe pain (pain score 7-10).  Dispense: 15 tablet; Refill: 0    Follow-up:  Return if symptoms worsen or fail to improve.  Jacqulyn Ahle DO [facility]

## 2024-04-22 NOTE — Assessment & Plan Note (Signed)
 Concern for cervical radiculopathy.  X-ray today.

## 2024-04-22 NOTE — Assessment & Plan Note (Signed)
 X-ray today for further evaluation.  Tramadol  as needed for pain.

## 2024-05-15 DIAGNOSIS — E785 Hyperlipidemia, unspecified: Secondary | ICD-10-CM | POA: Diagnosis not present

## 2024-05-15 DIAGNOSIS — Z933 Colostomy status: Secondary | ICD-10-CM | POA: Diagnosis not present

## 2024-05-15 DIAGNOSIS — Z008 Encounter for other general examination: Secondary | ICD-10-CM | POA: Diagnosis not present

## 2024-05-15 DIAGNOSIS — I10 Essential (primary) hypertension: Secondary | ICD-10-CM | POA: Diagnosis not present

## 2024-05-15 DIAGNOSIS — E669 Obesity, unspecified: Secondary | ICD-10-CM | POA: Diagnosis not present

## 2024-05-15 DIAGNOSIS — Z6830 Body mass index (BMI) 30.0-30.9, adult: Secondary | ICD-10-CM | POA: Diagnosis not present

## 2024-05-15 DIAGNOSIS — I2510 Atherosclerotic heart disease of native coronary artery without angina pectoris: Secondary | ICD-10-CM | POA: Diagnosis not present

## 2024-05-15 DIAGNOSIS — Z85038 Personal history of other malignant neoplasm of large intestine: Secondary | ICD-10-CM | POA: Diagnosis not present

## 2024-05-15 DIAGNOSIS — R7303 Prediabetes: Secondary | ICD-10-CM | POA: Diagnosis not present

## 2024-05-16 ENCOUNTER — Ambulatory Visit
Admission: EM | Admit: 2024-05-16 | Discharge: 2024-05-16 | Disposition: A | Discharge status: Home / Self Care | Attending: Nurse Practitioner | Admitting: Nurse Practitioner

## 2024-05-16 ENCOUNTER — Ambulatory Visit: Payer: Self-pay

## 2024-05-16 DIAGNOSIS — M79641 Pain in right hand: Secondary | ICD-10-CM | POA: Diagnosis not present

## 2024-05-16 DIAGNOSIS — R0789 Other chest pain: Secondary | ICD-10-CM

## 2024-05-16 DIAGNOSIS — M79601 Pain in right arm: Secondary | ICD-10-CM

## 2024-05-16 MED ORDER — DEXAMETHASONE SODIUM PHOSPHATE 10 MG/ML IJ SOLN
10.0000 mg | INTRAMUSCULAR | Status: AC
  Administered 2024-05-16: 10 mg via INTRAMUSCULAR

## 2024-05-16 MED ORDER — PREDNISONE 20 MG PO TABS: 40.0000 mg | ORAL_TABLET | Freq: Every day | ORAL | 0 refills | Status: AC

## 2024-05-16 MED ORDER — GABAPENTIN 100 MG PO CAPS: 100.0000 mg | ORAL_CAPSULE | Freq: Three times a day (TID) | ORAL | 0 refills | Status: DC

## 2024-05-16 NOTE — ED Provider Notes (Signed)
 [facility]    CSN: [id number] Arrival date & time: 05/16/24  1050      History   Chief Complaint Chief Complaint  Patient presents with   Arm Pain    HPI [name] is a 68 y.o. male.   The history is provided by the patient.   Patient presents for complaints of right upper extremity pain that radiates into the right chest.  Patient states he was working on his lawnmower on 04/09/2024, states that he fell backwards and hit his elbow on cement.  States that he was seen in this clinic on 04/13/2024 and then later seen by his PCP on 04/18/2024.  Patient states today, he has numbness and tingling in his 4th and 5th digits of his right hand.  He states when he bends the finger certain way, he can feel a pain that radiates into the right chest wall.  He states that he also has swelling to the right wrist.  Describes his pain as throbbing.  He states it is difficult for him to lift objects using the right hand.  Patient states he is awaiting a referral to a specialist in [place] by his PCP office.  Also states he has been seeing a [facility].  States he has been using ice for the pain with minimal relief.  He denies bruising, erythema, shortness of breath, difficulty breathing, palpitations, or wheezing.  Past Medical History:  Diagnosis Date   Arthritis    hands and shoulders (left shoulder is worse than right)   Aspiration pneumonia (HCC) 03/24/2014   after surgery for colostomy   BPH (benign prostatic hyperplasia)    Colostomy in place [facility])    Complication of anesthesia 03/24/2014   aspiration pneumonia after surgery for colostomy, no problem with  surgeries since then   Difficulty urinating    Dyspnea    @ times due to hernias, seeing [facility] for  surgical consultation 04-20-2022, can lie flat   GERD (gastroesophageal reflux disease)    History of kidney stones    Hypertension    Ileostomy present (HCC)    Incisional hernia    right abdomen   Parastomal  hernia left    Rectal cancer, uT2uN0, s/p LAR/ileostomy 03/20/2014 colostomy Aug2015 12/25/2013   TCS MAY 2015    Restless leg    Right inguinal hernia    Shortening, leg, congenital    Left   Skin irritation    Surrounding ostomy site    Patient Active Problem List   Diagnosis Date Noted   Right-sided chest pain 04/22/2024   Neck pain 04/22/2024   Skin cyst 04/01/2024   Inguinal mass 04/01/2024   Kidney stones 01/23/2023   Erectile dysfunction 01/23/2023   Benign prostatic hyperplasia 01/23/2023   DOE (dyspnea on exertion) 10/10/2022   Hyperlipidemia 09/14/2021   Colostomy in place [facility])    Essential hypertension, benign 05/12/2014   Obesity (BMI 30-39.9) 03/24/2014   Rectal cancer, uT2uN0, s/p LAR/ileostomy 03/20/2014 colostomy Aug2015 12/25/2013   GERD (gastroesophageal reflux disease) 11/27/2013    Past Surgical History:  Procedure Laterality Date   COLON RESECTION N/A 03/24/2014   Procedure: Diagnostic  laparotomy, takedown of coloanal anastamosis, end colostomy, with wound vac;  Surgeon: Elspeth KYM Schultze, MD;  Location: [facility];  Service: General;  Laterality: N/A;   COLONOSCOPY N/A 12/22/2013   SLF: 1. Normal mucosa in the terminal ilium. 2. Moderate diverticulosis in the transverse colon, descending colon, and sigmoid colon. 3. Rectal bleeding due ot rectal  mass and hemorrhoids 4. Moderate sized internal hemorrhoids.    COLONOSCOPY N/A 01/19/2015   Procedure: COLONOSCOPY;  Surgeon: Margo LITTIE Haddock, MD;  [facility];  Service: Endoscopy;  Laterality: N/A;  1215   COLONOSCOPY N/A 03/22/2018   Procedure: COLONOSCOPY;  Surgeon: Haddock Margo LITTIE, MD;  [facility];  Service: Endoscopy;  Laterality: N/A;  2:15pm   COLONOSCOPY WITH PROPOFOL  N/A 05/28/2023   Procedure: COLONOSCOPY WITH PROPOFOL ;  Surgeon: Cindie Carlin POUR, DO;  [facility];  Service: Endoscopy;  Laterality: N/A;  1:00 pm, asa 3   COLOSTOMY     03-24-2014   CYSTOSCOPY N/A 09/24/2023    Procedure: CYSTOSCOPY;  Surgeon: Sherrilee Belvie LITTIE, MD;  Location: [facility];  Service: Urology;  Laterality: N/A;   ESOPHAGOGASTRODUODENOSCOPY N/A 12/22/2013   SLF: Probable Candia esophagitis2. Probable Barretts esphagus 3. Mild gastritis.    EUS N/A 01/01/2014   Procedure: LOWER ENDOSCOPIC ULTRASOUND (EUS);  Surgeon: Toribio SHAUNNA Cedar, MD;  Location: [facility];  Service: Endoscopy;  Laterality: N/A;   EXAMINATION UNDER ANESTHESIA  08/25/2014   Procedure: EXAM UNDER ANESTHESIA;  Surgeon: Elspeth Schultze, MD;  Location: [facility];  Service: General;;   EXTRACORPOREAL SHOCK WAVE LITHOTRIPSY Right 07/25/2022   Procedure: EXTRACORPOREAL SHOCK WAVE LITHOTRIPSY (ESWL);  Surgeon: Sherrilee Belvie LITTIE, MD;  Location: [facility];  Service: Urology;  Laterality: Right;   eye reattachment and ear reattachment  Right 08/21/1969   FLEXIBLE SIGMOIDOSCOPY N/A 03/24/2014   Procedure: FLEXIBLE SIGMOIDOSCOPY;  Surgeon: Elspeth KYM Schultze, MD;  [facility];  Service: General;  Laterality: N/A;   HIP FRACTURE SURGERY Left age 37 on 04-06-1970   ILEOSTOMY     ILEOSTOMY CLOSURE N/A 08/25/2014   Procedure: TAKEDOWN OF LOOP ILEOSTOMY ;  Surgeon: Elspeth Schultze, MD;  Location: [facility];  Service: General;  Laterality: N/A;   INSERTION OF MESH N/A 04/11/2017   Procedure: INSERTION OF MESH ;  Surgeon: Schultze Elspeth, MD;  [facility];  Service: General;  Laterality: N/A;  BILATERAL TAP BLOCK   POLYPECTOMY  05/28/2023   Procedure: POLYPECTOMY;  Surgeon: Cindie Carlin POUR, DO;  [facility];  Service: Endoscopy;;   THULIUM LASER TURP (TRANSURETHRAL RESECTION OF PROSTATE) N/A 04/04/2022   Procedure: THULIUM LASER TURP (TRANSURETHRAL RESECTION OF PROSTATE);  Surgeon: Nieves Cough, MD;  Location: [facility];  Service: Urology;  Laterality: N/A;   TOTAL HIP ARTHROPLASTY Left 11/19/2015   Procedure: LEFT TOTAL HIP ARTHROPLASTY ANTERIOR APPROACH;  Surgeon: Lonni CINDERELLA Poli, MD;  [facility];   Service: Orthopedics;  Laterality: Left;   TRANSURETHRAL RESECTION OF PROSTATE N/A 09/24/2023   Procedure: TRANSURETHRAL RESECTION OF THE PROSTATE (TURP);  Surgeon: Sherrilee Belvie LITTIE, MD;  Location: [facility];  Service: Urology;  Laterality: N/A;   VENTRAL HERNIA REPAIR N/A 04/11/2017   Procedure: LAPAROSCOPIC INCARCERATED PERISTOMAL AND INCARCERATED INCISIONAL HERNIA X2;  Surgeon: Schultze Elspeth, MD;  Location: [facility];  Service: General;  Laterality: N/A;  BILATERAL TAP BLOCK       Home Medications    Prior to Admission medications   Medication Sig Start Date End Date Taking? Authorizing Provider  gabapentin  (NEURONTIN ) 100 MG capsule Take 1 capsule (100 mg total) by mouth 3 (three) times daily. 05/16/24  Yes Leath-Warren, Etta PARAS, NP  predniSONE  (DELTASONE ) 20 MG tablet Take 2 tablets (40 mg total) by mouth daily with breakfast for 5 days. 05/16/24 05/21/24 Yes Leath-Warren, Etta PARAS, NP  amLODipine  (NORVASC ) 5 MG tablet Take  1 tablet (5 mg total) by mouth daily. 04/01/24   Cook, Jayce G, DO  APPLE CIDER VINEGAR PO Take 1 tablet by mouth in the morning and at bedtime.    [provider]  aspirin  EC 81 MG tablet Take 81 mg by mouth daily.    [provider]  baclofen  (LIORESAL ) 10 MG tablet Take 1 tablet (10 mg total) by mouth at bedtime as needed for muscle spasms. For leg cramping 04/22/24   Cook, Jayce G, DO  Beta Carotene (VITAMIN A) 25000 UNIT capsule Take 25,000 Units by mouth daily.    [provider]  chlorthalidone  (HYGROTON ) 25 MG tablet Take 0.5 tablets (12.5 mg total) by mouth daily. 07/11/23 04/01/24  Debera Jayson MATSU, MD  co-enzyme Q-10 30 MG capsule Take 30 mg by mouth daily.    [provider]  metoprolol  tartrate (LOPRESSOR ) 25 MG tablet Take 1 tablet (25 mg total) by mouth 2 (two) times daily. 04/22/24   Cook, Jayce G, DO  Multiple Vitamins-Minerals (PRESERVISION AREDS 2 PO) Take 1 capsule by mouth in the morning and at bedtime.    [provider]  naproxen  (NAPROSYN ) 500 MG tablet Take 1 tablet (500 mg total) by mouth 2 (two) times daily as needed. 04/13/24   Stuart Vernell Norris, PA-C  pantoprazole  (PROTONIX ) 40 MG tablet Take 1 tablet (40 mg total) by mouth 2 (two) times daily before a meal. 02/04/24   Cook, Jayce G, DO  rosuvastatin  (CRESTOR ) 10 MG tablet Take 1 tablet (10 mg total) by mouth daily. 12/05/23   Cook, Jayce G, DO  silver  sulfADIAZINE  (SILVADENE ) 1 % cream Apply 1 Application topically daily. 11/08/23   Cook, Jayce G, DO  tiZANidine  (ZANAFLEX ) 4 MG capsule Take 1 capsule (4 mg total) by mouth 3 (three) times daily as needed for muscle spasms. Do not drink alcohol or drive while taking this medication.  May cause drowsiness. 04/13/24   Stuart Vernell Norris, PA-C  traMADol  (ULTRAM ) 50 MG tablet Take 1 tablet (50 mg total) by mouth every 8 (eight) hours as needed for moderate pain (pain score 4-6) or severe pain (pain score 7-10). 04/18/24   Cook, Jayce G, DO  Turmeric 500 MG CAPS Take 500 mg by mouth 2 (two) times daily.     [provider]  vitamin B-12 (CYANOCOBALAMIN ) 500 MCG tablet Take 1,000 mcg by mouth daily.    [provider]  vitamin C  (ASCORBIC ACID ) 500 MG tablet Take 500 mg by mouth 2 (two) times daily.    [provider]    Family History Family History  Problem Relation Age of Onset   Colon cancer Sister    Emphysema Mother    Heart attack Brother    Colon polyps Neg Hx     Social History Social History   Tobacco Use   Smoking status: Former    Types: Cigars    Quit date: 2012    Years since quitting: 13.7   Smokeless tobacco: Never  Vaping Use   Vaping status: Never Used  Substance Use Topics   Alcohol use: No    Comment: quit years ago   Drug use: No     Allergies   Patient has no known allergies.   Review of Systems Review of Systems Per HPI  Physical Exam Triage Vital Signs ED Triage Vitals  Encounter Vitals Group     BP 05/16/24 1142  (!) 142/2     Girls Systolic BP Percentile --  Girls Diastolic BP Percentile --      Boys Systolic BP Percentile --      Boys Diastolic BP Percentile --      Pulse Rate 05/16/24 1145 67     Resp 05/16/24 1142 18     Temp 05/16/24 1142 98.1 F (36.7 C)     Temp Source 05/16/24 1142 Oral     SpO2 05/16/24 1142 95 %     Weight --      Height --      Head Circumference --      Peak Flow --      Pain Score 05/16/24 1145 9     Pain Loc --      Pain Education --      Exclude from Growth Chart --    No data found.  Updated Vital Signs BP (!) 142/2 (BP Location: Right Arm)   Pulse 67   Temp 98.1 F (36.7 C) (Oral)   Resp 18   SpO2 95%   Visual Acuity Right Eye Distance:   Left Eye Distance:   Bilateral Distance:    Right Eye Near:   Left Eye Near:    Bilateral Near:     Physical Exam Vitals and nursing note reviewed.  Constitutional:      General: He is not in acute distress.    Appearance: Normal appearance.  HENT:     Head: Normocephalic.  Eyes:     Extraocular Movements: Extraocular movements intact.     Pupils: Pupils are equal, round, and reactive to light.  Cardiovascular:     Rate and Rhythm: Normal rate and regular rhythm.     Pulses: Normal pulses.     Heart sounds: Normal heart sounds.  Pulmonary:     Effort: Pulmonary effort is normal.     Breath sounds: Normal breath sounds.  Chest:     Chest wall: Tenderness present. No deformity or swelling.       Comments: Chest wall tenderness noted on exam.  There is no obvious bruising, swelling, or erythema present. Musculoskeletal:     Right shoulder: Tenderness present. No swelling. Decreased range of motion. Decreased strength. Normal pulse.     Right upper arm: Tenderness present. No swelling or deformity.     Right elbow: No swelling or deformity. Tenderness present in olecranon process.     Right forearm: Tenderness present. No swelling.     Right wrist: Swelling (ulnar aspect of right wrist) and  tenderness present. Decreased range of motion. Normal pulse.     Right hand: Tenderness (4th and 5th fingers) present. No swelling. Decreased range of motion. Decreased strength. Decreased sensation. Normal capillary refill. Normal pulse.     Cervical back: Normal range of motion.  Skin:    General: Skin is warm and dry.  Neurological:     General: No focal deficit present.     Mental Status: He is alert and oriented to person, place, and time.  Psychiatric:        Mood and Affect: Mood normal.        Behavior: Behavior normal.      UC Treatments / Results  Labs (all labs ordered are listed, but only abnormal results are displayed) Labs Reviewed - No data to display  EKG   Radiology No results found.  Procedures Procedures (including critical care time)  Medications Ordered in UC Medications  dexamethasone  (DECADRON ) injection 10 mg (10 mg Intramuscular Given 05/16/24 1200)    Initial Impression /  Assessment and Plan / UC Course  I have reviewed the triage vital signs and the nursing notes.  Pertinent labs & imaging results that were available during my care of the patient were reviewed by me and considered in my medical decision making (see chart for details).  Patient with numbness and tingling in the 4th and 5th fingers of the right hand, pain radiates from the right hand into the right chest wall, differential diagnoses include ulnar nerve entrapment.  Do suspect patient does have some nerve involvement given his current symptoms.  Decadron  10 mg IM to help with nerve inflammation.  Will start patient on prednisone  40 mg for inflammation and gabapentin  100 mg for nerve pain.  Supportive care recommendations were provided discussed with the patient to include continuing the use of ice or heat, over-the-counter Tylenol , and gentle range of motion exercises of the right hand.  Patient advised to follow-up with his P CP office regarding referral.  Patient was in agreement with  this plan of care and verbalizes understanding.  All questions were answered.  Patient stable for discharge.  Final Clinical Impressions(s) / UC Diagnoses   Final diagnoses:  Right hand pain  Right arm pain  Right-sided chest wall pain     Discharge Instructions      You were given an injection of Decadron  10 mg.  Start the prednisone  tomorrow. You may take over-the-counter Tylenol  as needed for pain or discomfort. You may continue the use of ice or heat.  Apply ice for pain or swelling, heat for spasm or stiffness.  Apply for 20 minutes, remove for 1 hour, repeat as needed. Continue with gentle range of motion exercises of the right hand. Please follow-up with your PCP office to check the status of the referral. Go to the emergency department if you experience worsening pain, shortness of breath, difficulty breathing, or other concerns. Follow-up as needed.     ED Prescriptions     Medication Sig Dispense Auth. Provider   predniSONE  (DELTASONE ) 20 MG tablet Take 2 tablets (40 mg total) by mouth daily with breakfast for 5 days. 10 tablet Leath-Warren, Etta PARAS, NP   gabapentin  (NEURONTIN ) 100 MG capsule Take 1 capsule (100 mg total) by mouth 3 (three) times daily. 30 capsule Leath-Warren, Etta PARAS, NP      PDMP not reviewed this encounter.   Gilmer Etta PARAS, NP 05/16/24 1227

## 2024-05-16 NOTE — Discharge Instructions (Signed)
 You were given an injection of Decadron  10 mg.  Start the prednisone  tomorrow. You may take over-the-counter Tylenol  as needed for pain or discomfort. You may continue the use of ice or heat.  Apply ice for pain or swelling, heat for spasm or stiffness.  Apply for 20 minutes, remove for 1 hour, repeat as needed. Continue with gentle range of motion exercises of the right hand. Please follow-up with your PCP office to check the status of the referral. Go to the emergency department if you experience worsening pain, shortness of breath, difficulty breathing, or other concerns. Follow-up as needed.

## 2024-05-16 NOTE — ED Triage Notes (Signed)
 Pt reports he has right hand numbness and pain that radiates up to his right shoulder going into the right side of his chest x 1 month.   States he wants pain meds as he is aware of his last x ray showing negative for fracture after a fall on 8/20

## 2024-05-16 NOTE — Telephone Encounter (Signed)
 Spoke with pt. He is not having active chest pain and says that this pain is not related to his heart. He states that he is severe pain with his arm that is radiating to the chest wall. He states that it feels like he has pulled something. He states is arm is very sore and he needs pain medication immediately. I informed him that [facility] is closed today and to report to the UC/ED since he is in severe pain. He asked that this be sent to Dr Bluford.

## 2024-05-16 NOTE — Telephone Encounter (Signed)
 FYI Only or Action Required?: Action required by provider: would like pain medication called in and to make sure ortho consult is in.  Instructed to go to the er if becomes worse.  Patient was last seen in primary care on 04/18/2024 by Cook, Jayce G, DO.  Called Nurse Triage reporting Chest Pain.  Symptoms began several weeks ago.  Interventions attempted: Nothing.  Symptoms are: unchanged.  Triage Disposition: See PCP When Office is Open (Within 3 Days)  Patient/caregiver understands and will follow disposition?: Yes   Copied from CRM #[id number]. Topic: Clinical - Red Word Triage >> May 16, 2024  8:55 AM Shanda MATSU wrote: Red Word that prompted transfer to Nurse Triage: has been in pain for 4 weeks, however, last night pain started in chest area and radiated all down right arm, cannot feel the two small fingers in right hand, patient stated he is in severe pain where he was only able to sleep for an hour last night. Reason for Disposition  [1] Chest pain(s) lasting a few seconds AND [2] persists > 3 days  Answer Assessment - Initial Assessment Questions 1. LOCATION: Where does it hurt?       Chest area 2. RADIATION: Does the pain go anywhere else? (e.g., into neck, jaw, arms, back)     Down right arm, cannot feel two small fingers on right hand 3. ONSET: When did the chest pain begin? (Minutes, hours or days)      Last night, was seen in UC and was then seen by PCP for this. 4. PATTERN: Does the pain come and go, or has it been constant since it started?  Does it get worse with exertion?      Constant, states injury to right arm 5. DURATION: How long does it last (e.g., seconds, minutes, hours)     constant 6. SEVERITY: How bad is the pain?  (e.g., Scale 1-10; mild, moderate, or severe)     8/10 7. CARDIAC RISK FACTORS: Do you have any history of heart problems or risk factors for heart disease? (e.g., angina, prior heart attack; diabetes, high blood pressure, high  cholesterol, smoker, or strong family history of heart disease)     denies 8. PULMONARY RISK FACTORS: Do you have any history of lung disease?  (e.g., blood clots in lung, asthma, emphysema, birth control pills)     Denies, states pain is from the fall when he injured his arm 9. CAUSE: What do you think is causing the chest pain?     Arm injury 10. OTHER SYMPTOMS: Do you have any other symptoms? (e.g., dizziness, nausea, vomiting, sweating, fever, difficulty breathing, cough)       no 11. PREGNANCY: Is there any chance you are pregnant? When was your last menstrual period?       na  Protocols used: Chest Pain-A-AH

## 2024-05-19 ENCOUNTER — Other Ambulatory Visit: Payer: Self-pay | Admitting: Family Medicine

## 2024-05-19 DIAGNOSIS — M79601 Pain in right arm: Secondary | ICD-10-CM

## 2024-05-19 NOTE — Telephone Encounter (Signed)
 Appointment scheduled 05/20/2024 at 11:20 AM with Charmaine PA- patient notified

## 2024-05-20 ENCOUNTER — Ambulatory Visit (INDEPENDENT_AMBULATORY_CARE_PROVIDER_SITE_OTHER): Payer: Self-pay | Admitting: Physician Assistant

## 2024-05-20 ENCOUNTER — Encounter: Payer: Self-pay | Admitting: Physician Assistant

## 2024-05-20 VITALS — BP 120/76 | HR 70 | Resp 18 | Ht 65.0 in | Wt 185.0 lb

## 2024-05-20 DIAGNOSIS — M5412 Radiculopathy, cervical region: Secondary | ICD-10-CM

## 2024-05-20 MED ORDER — GABAPENTIN 100 MG PO CAPS: 200.0000 mg | ORAL_CAPSULE | Freq: Three times a day (TID) | ORAL | 1 refills | Status: DC

## 2024-05-20 MED ORDER — TRAMADOL HCL 50 MG PO TABS: 50.0000 mg | ORAL_TABLET | Freq: Four times a day (QID) | ORAL | 0 refills | Status: DC | PRN

## 2024-05-20 NOTE — Progress Notes (Signed)
 Acute Office Visit  Subjective:     Patient ID: [name], male    DOB: 10-Aug-1956, 68 y.o.   MRN: [id number]   Discussed the use of AI scribe software for clinical note transcription with the patient, who gave verbal consent to proceed.  History of Present Illness [name] is a 68 year old male who presents with severe right arm pain.  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 The pain, initially mild, worsened over the weekend following his fall, becoming severe by Sunday morning with sweating and tingling. X-rays showed no fractures. Patient has seen his PCP approximately 1 month ago and prescribed tramadol  for pain control. However, cervical spine x-rays at that time revealed significant cervical degeneration. He was prescribed a steroid and gabapentin  at urgent care this Friday, which have not alleviated the pain. 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  The pain radiates from the inside of his forearm to his shoulder, neck, and right sided chest wall. It disrupts sleep, waking him at 2:30 AM [name]. Current medications include gabapentin  100 mg three times [name], prednisone , ibuprofen, and tramadol . He admits so decreased strength in the right hand.     Review of Systems  Constitutional:  Negative for fever, malaise/fatigue and weight loss.  Musculoskeletal:  Positive for joint pain and neck pain.  Neurological:  Positive for tingling, sensory change and weakness (hand).        Objective:     BP 120/76   Pulse 70   Resp 18   Ht 5' 5 (1.651 m)   Wt 185 lb (83.9 kg)   SpO2 94%   BMI 30.79 kg/m   Physical Exam Constitutional:      General: He is not in acute distress.    Appearance: Normal appearance. He is not ill-appearing.  HENT:     Head: Normocephalic and atraumatic.  Cardiovascular:     Rate and Rhythm: Normal rate and regular  rhythm.     Heart sounds: Normal heart sounds. No murmur heard. Pulmonary:     Effort: Pulmonary effort is normal.     Breath sounds: Normal breath sounds.  Musculoskeletal:     Right shoulder: Tenderness present. Decreased range of motion. Normal strength.     Right forearm: Tenderness present. No swelling or deformity.     Right hand: No swelling or tenderness. Decreased range of motion. Decreased strength (decreased grip strength). Decreased sensation (to 4th and 5th digit) of the ulnar distribution. Normal capillary refill. Normal pulse.     Cervical back: Tenderness (midline lower cervical) present. No swelling, edema or deformity. Decreased range of motion.  Neurological:     Mental Status: He is alert and oriented to person, place, and time. Mental status is at baseline.     No results found for any visits on 05/20/24.      Assessment & Plan:  Cervical radiculopathy Assessment & Plan: Persistent right arm and shoulder pain, weakness, and sensory changes post-fall. X-rays indicate degenerative changes and foraminal narrowing at C5-C7. Minimal relief from current treatment. Differential includes nerve damage due to cervical spondylosis. - Continue gabapentin , increase dose to 600 mg three times [name] starting Friday. - Finish current course of prednisone . - Prescribe tramadol  for severe pain or bedtime use. - Continue ibuprofen, 800 mg up to three times [name] as needed. - Referral to neurosurgery for evaluation  of cervical spine issues. - Follow up for worsening symptoms such as decreased strength or sensation.   Orders: -     Ambulatory referral to Neurosurgery -     Gabapentin ; Take 2 capsules (200 mg total) by mouth 3 (three) times [name].  Dispense: 180 capsule; Refill: 1 -     traMADol  HCl; Take 1 tablet (50 mg total) by mouth every 6 (six) hours as needed for moderate pain (pain score 4-6) or severe pain (pain score 7-10).  Dispense: 20 tablet; Refill: 0     Return if  symptoms worsen or fail to improve.  Charmaine Johnnay Pleitez, PA-C

## 2024-05-20 NOTE — Assessment & Plan Note (Addendum)
 Persistent right arm and shoulder pain, weakness, and sensory changes post-fall. X-rays indicate degenerative changes and foraminal narrowing at C5-C7. Minimal relief from current treatment. Differential includes nerve damage due to cervical spondylosis. - Continue gabapentin , increase dose to 600 mg three times daily starting Friday. - Finish current course of prednisone . - Prescribe tramadol  for severe pain or bedtime use. - Continue ibuprofen, 800 mg up to three times daily as needed. - Referral to neurosurgery for evaluation of cervical spine issues. - Follow up for worsening symptoms such as decreased strength or sensation.

## 2024-06-06 NOTE — Progress Notes (Addendum)
 Referring Physician:  Grooms, Charmaine, PA-C [address], [place],  [place] [address]  Primary Physician:  Cook, Jayce G, DO  History of Present Illness: 06/11/2024 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  Patient initially had pain under his armpit and this has migrated down his arm.  He is having extreme neuropathic pain in this arm that radiates into his pinky finger and half of his ring finger.  This has been unrelieved with gabapentin .  He has noticed new weakness in his right hand and the inability to bring his pinky and towards the rest of his hand.   Weakness: none  Bowel/Bladder Dysfunction: none  Conservative measures:  Physical therapy:  has not participated in Multimodal medical therapy including regular antiinflammatories:  Tramadol , Prednisone , Gabapentin , Ibuprofen, Naproxen , Tizanidine  Injections:  no epidural steroid injections  Past Surgery: no spine surgery  [name] [name] has no symptoms of cervical myelopathy.  The symptoms are causing a significant impact on the patient's life.   Review of Systems:  A 10 point review of systems is negative, except for the pertinent positives and negatives detailed in the HPI.  Past Medical History: Past Medical History:  Diagnosis Date   Arthritis    hands and shoulders (left shoulder is worse than right)   Aspiration pneumonia (HCC) 03/24/2014   after surgery for colostomy   BPH (benign prostatic hyperplasia)    Colostomy in place [facility])    Complication of anesthesia 03/24/2014   aspiration pneumonia after surgery for colostomy, no problem with  surgeries since then   Difficulty urinating    Dyspnea    @ times due to hernias, seeing [facility] for  surgical consultation 04-20-2022, can lie flat   GERD (gastroesophageal reflux disease)    History of kidney stones    Hypertension     Ileostomy present (HCC)    Incisional hernia    right abdomen   Parastomal hernia left    Rectal cancer, uT2uN0, s/p LAR/ileostomy 03/20/2014 colostomy Aug2015 12/25/2013   TCS MAY 2015    Restless leg    Right inguinal hernia    Shortening, leg, congenital    Left   Skin irritation    Surrounding ostomy site    Past Surgical History: Past Surgical History:  Procedure Laterality Date   COLON RESECTION N/A 03/24/2014   Procedure: Diagnostic  laparotomy, takedown of coloanal anastamosis, end colostomy, with wound vac;  Surgeon: Elspeth KYM Schultze, MD;  Location: [facility];  Service: General;  Laterality: N/A;   COLON SURGERY  02/2014   COLONOSCOPY N/A 12/22/2013   SLF: 1. Normal mucosa in the terminal ilium. 2. Moderate diverticulosis in the transverse colon, descending colon, and sigmoid colon. 3. Rectal bleeding due ot rectal mass and hemorrhoids 4. Moderate sized internal hemorrhoids.    COLONOSCOPY N/A 01/19/2015   Procedure: COLONOSCOPY;  Surgeon: Margo [name] Haddock, MD;  [facility];  Service: Endoscopy;  Laterality: N/A;  1215   COLONOSCOPY N/A 03/22/2018   Procedure: COLONOSCOPY;  Surgeon: Haddock Margo LITTIE, MD;  [facility];  Service: Endoscopy;  Laterality: N/A;  2:15pm   COLONOSCOPY WITH PROPOFOL  N/A 05/28/2023   Procedure: COLONOSCOPY WITH PROPOFOL ;  Surgeon: Cindie Carlin POUR, DO;  [facility];  Service: Endoscopy;  Laterality: N/A;  1:00 pm, asa 3   COLOSTOMY     03-24-2014  CYSTOSCOPY N/A 09/24/2023   Procedure: CYSTOSCOPY;  Surgeon: Sherrilee Belvie CROME, MD;  Location: [facility];  Service: Urology;  Laterality: N/A;   ESOPHAGOGASTRODUODENOSCOPY N/A 12/22/2013   SLF: Probable Candia esophagitis2. Probable Barretts esphagus 3. Mild gastritis.    EUS N/A 01/01/2014   Procedure: LOWER ENDOSCOPIC ULTRASOUND (EUS);  Surgeon: Toribio SHAUNNA Cedar, MD;  Location: [facility];  Service: Endoscopy;  Laterality: N/A;   EXAMINATION UNDER ANESTHESIA  08/25/2014    Procedure: EXAM UNDER ANESTHESIA;  Surgeon: Elspeth Schultze, MD;  Location: [facility];  Service: General;;   EXTRACORPOREAL SHOCK WAVE LITHOTRIPSY Right 07/25/2022   Procedure: EXTRACORPOREAL SHOCK WAVE LITHOTRIPSY (ESWL);  Surgeon: Sherrilee Belvie CROME, MD;  Location: [facility];  Service: Urology;  Laterality: Right;   eye reattachment and ear reattachment  Right 08/21/1969   FLEXIBLE SIGMOIDOSCOPY N/A 03/24/2014   Procedure: FLEXIBLE SIGMOIDOSCOPY;  Surgeon: Elspeth KYM Schultze, MD;  Location: [facility];  Service: General;  Laterality: N/A;   HIP FRACTURE SURGERY Left age 89 on 04-06-1970   ILEOSTOMY     ILEOSTOMY CLOSURE N/A 08/25/2014   Procedure: TAKEDOWN OF LOOP ILEOSTOMY ;  Surgeon: Elspeth Schultze, MD;  Location: [facility];  Service: General;  Laterality: N/A;   INSERTION OF MESH N/A 04/11/2017   Procedure: INSERTION OF MESH ;  Surgeon: Schultze Elspeth, MD;  Location: [facility];  Service: General;  Laterality: N/A;  BILATERAL TAP BLOCK   JOINT REPLACEMENT  10/2015   POLYPECTOMY  05/28/2023   Procedure: POLYPECTOMY;  Surgeon: Cindie Carlin POUR, DO;  [facility];  Service: Endoscopy;;   THULIUM LASER TURP (TRANSURETHRAL RESECTION OF PROSTATE) N/A 04/04/2022   Procedure: THULIUM LASER TURP (TRANSURETHRAL RESECTION OF PROSTATE);  Surgeon: Nieves Cough, MD;  Location[facility];  Service: Urology;  Laterality: N/A;   TOTAL HIP ARTHROPLASTY Left 11/19/2015   Procedure: LEFT TOTAL HIP ARTHROPLASTY ANTERIOR APPROACH;  Surgeon: Lonni CINDERELLA Poli, MD;  [facility];  Service: Orthopedics;  Laterality: Left;   TRANSURETHRAL RESECTION OF PROSTATE N/A 09/24/2023   Procedure: TRANSURETHRAL RESECTION OF THE PROSTATE (TURP);  Surgeon: Sherrilee Belvie CROME, MD;  [facility];  Service: Urology;  Laterality: N/A;   VENTRAL HERNIA REPAIR N/A 04/11/2017   Procedure: LAPAROSCOPIC INCARCERATED PERISTOMAL AND INCARCERATED INCISIONAL HERNIA X2;  Surgeon: Schultze Elspeth, MD;  Location: [facility];   Service: General;  Laterality: N/A;  BILATERAL TAP BLOCK    Allergies: Allergies as of 06/11/2024 - Review Complete 06/11/2024  Allergen Reaction Noted   Oxycodone   06/11/2024    Medications: Outpatient Encounter Medications as of 06/11/2024  Medication Sig   amLODipine  (NORVASC ) 5 MG tablet Take 1 tablet (5 mg total) by mouth daily.   aspirin  EC 81 MG tablet Take 81 mg by mouth daily.   baclofen  (LIORESAL ) 10 MG tablet Take 1 tablet (10 mg total) by mouth at bedtime as needed for muscle spasms. For leg cramping   chlorthalidone  (HYGROTON ) 25 MG tablet Take 0.5 tablets (12.5 mg total) by mouth daily.   gabapentin  (NEURONTIN ) 100 MG capsule Take 2 capsules (200 mg total) by mouth 3 (three) times daily.   metoprolol  tartrate (LOPRESSOR ) 25 MG tablet Take 1 tablet (25 mg total) by mouth 2 (two) times daily.   Multiple Vitamins-Minerals (PRESERVISION AREDS 2 PO) Take 1 capsule by mouth in the morning and at bedtime.   pantoprazole  (PROTONIX ) 40 MG tablet Take 1 tablet (40 mg total) by mouth 2 (two) times daily before a meal.   rosuvastatin  (CRESTOR ) 10  MG tablet Take 1 tablet (10 mg total) by mouth daily.   Turmeric 500 MG CAPS Take 500 mg by mouth 2 (two) times daily.    vitamin B-12 (CYANOCOBALAMIN ) 500 MCG tablet Take 1,000 mcg by mouth daily.   vitamin C  (ASCORBIC ACID ) 500 MG tablet Take 500 mg by mouth 2 (two) times daily.   [DISCONTINUED] APPLE CIDER VINEGAR PO Take 1 tablet by mouth in the morning and at bedtime. (Patient not taking: Reported on 06/11/2024)   [DISCONTINUED] Beta Carotene (VITAMIN A) 25000 UNIT capsule Take 25,000 Units by mouth daily. (Patient not taking: Reported on 06/11/2024)   [DISCONTINUED] co-enzyme Q-10 30 MG capsule Take 30 mg by mouth daily. (Patient not taking: Reported on 06/11/2024)   [DISCONTINUED] naproxen  (NAPROSYN ) 500 MG tablet Take 1 tablet (500 mg total) by mouth 2 (two) times daily as needed. (Patient not taking: Reported on 06/11/2024)    [DISCONTINUED] silver  sulfADIAZINE  (SILVADENE ) 1 % cream Apply 1 Application topically daily. (Patient not taking: Reported on 06/11/2024)   [DISCONTINUED] traMADol  (ULTRAM ) 50 MG tablet Take 1 tablet (50 mg total) by mouth every 6 (six) hours as needed for moderate pain (pain score 4-6) or severe pain (pain score 7-10). (Patient not taking: Reported on 06/11/2024)   No facility-administered encounter medications on file as of 06/11/2024.    Social History: Social History   Tobacco Use   Smoking status: Former    Types: Cigars    Quit date: 2012    Years since quitting: 13.8   Smokeless tobacco: Never  Vaping Use   Vaping status: Never Used  Substance Use Topics   Alcohol use: No    Comment: quit years ago   Drug use: No    Family Medical History: Family History  Problem Relation Age of Onset   Colon cancer Sister    Emphysema Mother    Heart attack Brother    Colon polyps Neg Hx     Physical Examination: @VITALWITHPAIN @  General: Patient is well developed, well nourished, calm, collected, and in no apparent distress. Attention to examination is appropriate.  Psychiatric: Patient is non-anxious.  Head:  Pupils equal, round, and reactive to light.  ENT:  Oral mucosa appears well hydrated.  Neck:   Supple.  Full range of motion.  Respiratory: Patient is breathing without any difficulty.  Extremities: No edema.  Vascular: Palpable dorsal pedal pulses.  Skin:   On exposed skin, there are no abnormal skin lesions.  NEUROLOGICAL:     Awake, alert, oriented to person, place, and time.  Speech is clear and fluent. Fund of knowledge is appropriate.   Cranial Nerves: Pupils equal round and reactive to light.  Facial tone is symmetric.    Tinel of right ulnar nerve. Decree sensation of ulnar nerve distribution in right hand. Positive Wartenberg, significantly decreased intrinsic weakness of the right hand-specifically in ADM.    Hoffman's is absent.  Clonus is  not present.  Toes are down-going.  Bilateral upper and lower extremity sensation is intact to light touch.    Gait is normal.   No difficulty with tandem gait.   No evidence of dysmetria noted.  Medical Decision Making  Imaging: EXAM: CERVICAL SPINE - COMPLETE 4+ VIEW   COMPARISON:  08/04/2005   FINDINGS: Marked degenerative changes at the C5-6 level with marked progression. Moderate degenerative changes at the C6-7 level with mild progression.   Uncinate spurs producing moderate foraminal stenosis on the right at the C5-6 and C6-7 levels and mild-to-moderate  foraminal stenosis on the left at the C5-6 level. There are also uncinate spurs on the left at the C6-7 level but the foramina is not adequately visualized to assess stenosis due to inadequate rotation on the left posterior oblique view.   No prevertebral soft tissue swelling, fractures or subluxations.   IMPRESSION: 1. No fracture or subluxation. 2. Marked degenerative changes at the C5-6 level with marked progression. 3. Moderate degenerative changes at the C6-7 level with mild progression. 4. Bilateral foraminal stenosis at the C5-6 and C6-7 levels.    I have personally reviewed the images and agree with the above interpretation.  Assessment and Plan: Mr. Ginsberg is a pleasant 68 y.o. 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  This is associated with migrating pain down his right upper extremity and numbness, weakness, and tingling of his fifth digit and half of his fourth..  Plan moving forward includes the following:  -Medrol  Dosepak for pain - Neuropathic pain medication changed to Lyrica - X-ray of right elbow today just to rule out fracture from the fall - Would like patient to undergo urgent EMG for evaluation. - Discussed with patient possible need of exploration or decompression of his right ulnar nerve in the future.   Thank you for involving me in the  care of this patient.    Lyle Decamp, PA-C Dept. of Neurosurgery

## 2024-06-11 ENCOUNTER — Ambulatory Visit: Admitting: Physician Assistant

## 2024-06-11 ENCOUNTER — Ambulatory Visit (INDEPENDENT_AMBULATORY_CARE_PROVIDER_SITE_OTHER)

## 2024-06-11 ENCOUNTER — Encounter: Payer: Self-pay | Admitting: Physician Assistant

## 2024-06-11 VITALS — BP 172/96 | Ht 65.0 in | Wt 188.0 lb

## 2024-06-11 DIAGNOSIS — G5621 Lesion of ulnar nerve, right upper limb: Secondary | ICD-10-CM

## 2024-06-11 DIAGNOSIS — M25521 Pain in right elbow: Secondary | ICD-10-CM

## 2024-06-11 DIAGNOSIS — R200 Anesthesia of skin: Secondary | ICD-10-CM

## 2024-06-11 DIAGNOSIS — W19XXXA Unspecified fall, initial encounter: Secondary | ICD-10-CM | POA: Diagnosis not present

## 2024-06-11 MED ORDER — METHYLPREDNISOLONE 4 MG PO TBPK: ORAL_TABLET | ORAL | 0 refills | Status: DC

## 2024-06-11 MED ORDER — PREGABALIN 50 MG PO CAPS: 50.0000 mg | ORAL_CAPSULE | Freq: Two times a day (BID) | ORAL | 2 refills | Status: DC

## 2024-06-18 ENCOUNTER — Other Ambulatory Visit: Payer: Self-pay

## 2024-06-18 ENCOUNTER — Encounter: Payer: Self-pay | Admitting: Neurology

## 2024-06-18 DIAGNOSIS — R202 Paresthesia of skin: Secondary | ICD-10-CM

## 2024-06-23 ENCOUNTER — Ambulatory Visit: Admitting: Neurology

## 2024-06-23 ENCOUNTER — Ambulatory Visit (INDEPENDENT_AMBULATORY_CARE_PROVIDER_SITE_OTHER): Admitting: Neurology

## 2024-06-23 DIAGNOSIS — M5412 Radiculopathy, cervical region: Secondary | ICD-10-CM

## 2024-06-23 DIAGNOSIS — G5621 Lesion of ulnar nerve, right upper limb: Secondary | ICD-10-CM

## 2024-06-23 DIAGNOSIS — R202 Paresthesia of skin: Secondary | ICD-10-CM | POA: Diagnosis not present

## 2024-06-23 NOTE — Procedures (Signed)
 [facility]  [address] [place], [address]  [place], [place] [address] Tel: [phone] Fax: [phone] Test Date:  06/23/2024  Patient: [name] DOB: 1955/09/16 Physician: Venetia Potters, MD  Sex: Male Height: 5' 5 Ref Phys: Lyle Decamp, DEVONNA  ID#: [id number]   Technician:    History: This is a 68 year old male with right arm numbness, tingling, and weakness.  NCV & EMG Findings: Extensive electrodiagnostic evaluation of the right upper limb shows: Right ulnar sensory response is absent. Right median sensory response shows prolonged distal peak latency (3.9 ms). Right radial sensory response is within normal limits. Right ulnar (ADM) motor response shows reduced amplitude (1.19 mV) and decreased conduction velocity from above elbow to below elbow stimulation sites (34 m/s). Right median (AP) motor response shows reduced amplitude (4.1 mV). Active denervation changes and reduced recruitment pattern WITHOUT chronic motor axon loss changes are seen in the right first dorsal interosseous muscle. Chronic motor loss changes WITH accompanying active denervation are seen in the right abductor digiti minimi and flexor digitorum profundus to digits 4,5 muscles. Chronic motor axon loss changes WITHOUT accompanying active denervation changes are seen in the right extensor indicis proprius and cervical paraspinal (C7 level) muscles.  Neuromuscular Ultrasound Findings: High frequency (4.0-16.0 MHz) B-mode, nonvascular ultrasound of the right upper limb shows: Cross sectional areas (CSA) of the right ulnar nerve is increased at the elbow (20.0 mm2). Nerve is hypoechoic, and there is loss of normal fascicular architecture at the elbow. In long axis view, there is a hyperechoic appearing lesion within the nerve with potential hypoechoic shadowing deep to the nerve. The right ulnar nerve subluxes and dislocates from the ulnar groove with maximal elbow flexion. No other obvious lesion  involving the adjacent bone or tendon is identified. No definite vascular abnormalities.  Impression: This is an abnormal study. The findings are most consistent with the following: Right ulnar mononeuropathy at the elbow, with active/ongoing denervation changes, severe in degree electrically. Neuromuscular ultrasound findings of hyperechoic area in right ulnar nerve at the elbow could represent calcification or scar among other possibilities. Overlapping residuals of an old intraspinal canal lesion (ie: motor radiculopathy) at the right C8 root or segment, which is difficult to grade the severity due to #1 above. Right median mononeuropathy at or distal to the wrist, consistent with carpal tunnel syndrome, mild in degree electrically.   ___________________________ Venetia Potters, MD    NCS+ Motor Nerve Results    Latency Amplitude F-Lat Segment Distance CV Comment  Site (ms) Norm (mV) Norm (ms)  (cm) (m/s) Norm   Right Median (APB) Motor  Wrist 3.8  < 4.0 *4.1  > 5.0        Elbow 9.4 - 4.1 -  Elbow-Wrist 28 50  > 50   Post exercise 3.8 - 3.9 -        Right Ulnar (ADM) Motor  Wrist 2.7  < 3.1 *1.19  > 7.0        Bel elbow 6.3 - 0.92 -  Bel elbow-Wrist 21 58  > 50   Ab elbow 9.2 - 0.76 -  Ab elbow-Bel elbow 10 34 -    Sensory Sites    Neg Peak Lat Amplitude (O-P) Segment Distance Velocity Comment  Site (ms) Norm (V) Norm  (cm) (ms)   Right Median Sensory  Wrist-Dig II *3.9  < 3.8 13  > 10 Wrist-Dig II 13    Right Radial Sensory  Forearm-Wrist 2.1  < 2.8 12  >  10 Forearm-Wrist 10    Right Ulnar Sensory  Wrist-Dig V *NR  < 3.2 *NR  > 5 Wrist-Dig V 11     EMG+   Side Muscle Ins.Act Fibs Fasc Recrt Amp Dur Poly Activation Comment  Right FDI Nml *2+ Nml *3- Nml Nml Nml *2- N/A  Right ADM Nml *1+ Nml *SMU *1+ *1+ *2+ *2- N/A  Right EIP Nml Nml Nml *2- *2+ *2+ *1+ Nml N/A  Right Pronator teres Nml Nml Nml Nml Nml Nml Nml Nml N/A  Right FDP 4,5 Nml *1+ Nml *3- *1+ *1+ *1+ *2- N/A   Right Biceps Nml Nml Nml Nml Nml Nml Nml Nml N/A  Right Triceps Nml Nml Nml Nml Nml Nml Nml Nml N/A  Right Deltoid Nml Nml Nml Nml Nml Nml Nml Nml N/A  Right C7 PSP Nml Nml Nml *1- *1+ *1+ *1+ Nml N/A   Nerve Measurements   Site Area Mobility Vascularity Comment   mm Norm     Right Ulnar  Distal elbow 6.6  < 10.0      Elbow *20.0  < 10.0    Hypoechoic with loss of fascicular architecture. Hyperechoic appearing lesion that appears to be  Within the right ulnar nerve. Hypoechoic appearing area just deep to right ulnar Nerve that could be acoustic shadowing.  Proximal elbow 9.4  < 10.0          Waveforms:  Motor      Sensory

## 2024-06-26 ENCOUNTER — Telehealth: Payer: Self-pay | Admitting: Physician Assistant

## 2024-06-26 NOTE — Telephone Encounter (Signed)
 Date11/6/25 Provider FRISBIE Med Requesting Pt states that the methylPREDNISolone  (MEDROL  DOSEPAK) 4 MG TBPK tablet he takes is not strong enough, he says that he would like something different. Pharmacy [facility] [address] in [place]  Patient contact: [phone]

## 2024-06-26 NOTE — Telephone Encounter (Signed)
 Pt also says if he can get a cortisone shot, he thinks that may help if its recommended for him.

## 2024-06-26 NOTE — Telephone Encounter (Signed)
 There is another message about this and what patient is asking for, this is in the result notes and Nichole has sent it to [place] to review. FYI

## 2024-06-27 ENCOUNTER — Encounter: Payer: Self-pay | Admitting: Neurosurgery

## 2024-06-27 ENCOUNTER — Ambulatory Visit: Payer: Self-pay | Admitting: Physician Assistant

## 2024-06-27 ENCOUNTER — Ambulatory Visit (INDEPENDENT_AMBULATORY_CARE_PROVIDER_SITE_OTHER): Admitting: Neurosurgery

## 2024-06-27 ENCOUNTER — Ambulatory Visit: Admitting: Neurosurgery

## 2024-06-27 VITALS — BP 140/80 | Ht 65.0 in | Wt 190.0 lb

## 2024-06-27 DIAGNOSIS — M25521 Pain in right elbow: Secondary | ICD-10-CM

## 2024-06-27 DIAGNOSIS — R200 Anesthesia of skin: Secondary | ICD-10-CM | POA: Insufficient documentation

## 2024-06-27 DIAGNOSIS — G5621 Lesion of ulnar nerve, right upper limb: Secondary | ICD-10-CM | POA: Diagnosis not present

## 2024-06-27 NOTE — Patient Instructions (Signed)
 Thank you for coming to see us  today at [facility] in [place].  We discussed your severe right sided ulnar neuropathy.  Will plan for a decompression and exploration of the ulnar nerve with your progressive weakness and severe findings on your EMG.  I am going to reach out to the nerve stimulator team to see whether or not you may be a candidate for this as some insurances will cover this type of procedure and others do not.  With your chronic pain and recent trials of multiple different pain medications and nerve pain medications without any relief you may be a candidate for this procedure.  When we have the nerve exposed we can place a stimulator on it directly.  I will reach out to you once I hear back from the nerve stimulator team and we will work on planning for ulnar nerve exploration intraoperative stimulation and possible plan of nerve stimulator placement.

## 2024-06-27 NOTE — Progress Notes (Signed)
 Referring Physician:  Cook, Jayce G, DO [address] [place],  [place] [address]  Primary Physician:  Cook, Jayce G, DO  History of Present Illness: 06/27/2024 Discussed the use of AI scribe software for clinical note transcription with the patient, who gave verbal consent to proceed.  History of Present Illness [name] is a 68 year old male who presents with right arm pain and weakness following a fall. He was referred by Lyle for evaluation of his arm condition.  On April 09, 2024, he fell backward in his shop, hitting his right elbow and shoulder on concrete. Initially mild, the pain intensified significantly by the next day. Since the fall, he experiences significant weakness in his right hand, especially in the fingers, and muscle wasting in the forearm. He describes a sensation of weight pulling down on his arm and severe pain impacting sleep. Pain radiates from the elbow to the fingers, worsening with pressure on the elbow.  An EMG on June 23, 2024, shows severe ulnar neuropathy with a hyperechoic area in the ulnar nerve at the elbow. Gabapentin  and prednisone  have not provided relief, and he takes five ibuprofen at night for pain management.  Bowel/Bladder Dysfunction: none  Conservative measures:  Physical therapy:  has not participated in Multimodal medical therapy including regular antiinflammatories:  Tramadol , Prednisone , Gabapentin , Ibuprofen, Naproxen , Tizanidine  Injections:  no epidural steroid injections  Past Surgery: no spine surgery  [name] Ana has no symptoms of cervical myelopathy.  The symptoms are causing a significant impact on the patient's life.   Review of Systems:  A 10 point review of systems is negative, except for the pertinent positives and negatives detailed in the HPI.  Past Medical History: Past Medical History:  Diagnosis Date   Arthritis    hands and shoulders (left shoulder is worse than right)   Aspiration  pneumonia (HCC) 03/24/2014   after surgery for colostomy   BPH (benign prostatic hyperplasia)    Colostomy in place [facility])    Complication of anesthesia 03/24/2014   aspiration pneumonia after surgery for colostomy, no problem with  surgeries since then   Difficulty urinating    Dyspnea    @ times due to hernias, seeing [facility] for  surgical consultation 04-20-2022, can lie flat   GERD (gastroesophageal reflux disease)    History of kidney stones    Hypertension    Ileostomy present (HCC)    Incisional hernia    right abdomen   Parastomal hernia left    Rectal cancer, uT2uN0, s/p LAR/ileostomy 03/20/2014 colostomy Aug2015 12/25/2013   TCS MAY 2015    Restless leg    Right inguinal hernia    Shortening, leg, congenital    Left   Skin irritation    Surrounding ostomy site    Past Surgical History: Past Surgical History:  Procedure Laterality Date   COLON RESECTION N/A 03/24/2014   Procedure: Diagnostic  laparotomy, takedown of coloanal anastamosis, end colostomy, with wound vac;  Surgeon: Elspeth KYM Schultze, MD;  Location: [facility];  Service: General;  Laterality: N/A;   COLON SURGERY  02/2014   COLONOSCOPY N/A 12/22/2013   SLF: 1. Normal mucosa in the terminal ilium. 2. Moderate diverticulosis in the transverse colon, descending colon, and sigmoid colon. 3. Rectal bleeding due ot rectal mass and hemorrhoids 4. Moderate sized internal hemorrhoids.    COLONOSCOPY N/A 01/19/2015   Procedure: COLONOSCOPY;  Surgeon: Margo [name] Haddock, MD;  [facility];  Service: Endoscopy;  Laterality: N/A;  1215   COLONOSCOPY N/A 03/22/2018   Procedure: COLONOSCOPY;  Surgeon: Harvey Margo CROME, MD;  [facility];  Service: Endoscopy;  Laterality: N/A;  2:15pm   COLONOSCOPY WITH PROPOFOL  N/A 05/28/2023   Procedure: COLONOSCOPY WITH PROPOFOL ;  Surgeon: Cindie Carlin POUR, DO;  [facility];  Service: Endoscopy;  Laterality: N/A;  1:00 pm, asa 3   COLOSTOMY     03-24-2014    CYSTOSCOPY N/A 09/24/2023   Procedure: CYSTOSCOPY;  Surgeon: Sherrilee Belvie CROME, MD;  Location: [facility];  Service: Urology;  Laterality: N/A;   ESOPHAGOGASTRODUODENOSCOPY N/A 12/22/2013   SLF: Probable Candia esophagitis2. Probable Barretts esphagus 3. Mild gastritis.    EUS N/A 01/01/2014   Procedure: LOWER ENDOSCOPIC ULTRASOUND (EUS);  Surgeon: Toribio SHAUNNA Cedar, MD;  Location: [facility];  Service: Endoscopy;  Laterality: N/A;   EXAMINATION UNDER ANESTHESIA  08/25/2014   Procedure: EXAM UNDER ANESTHESIA;  Surgeon: Elspeth Schultze, MD;  Location: [facility];  Service: General;;   EXTRACORPOREAL SHOCK WAVE LITHOTRIPSY Right 07/25/2022   Procedure: EXTRACORPOREAL SHOCK WAVE LITHOTRIPSY (ESWL);  Surgeon: Sherrilee Belvie CROME, MD;  Location: [facility];  Service: Urology;  Laterality: Right;   eye reattachment and ear reattachment  Right 08/21/1969   FLEXIBLE SIGMOIDOSCOPY N/A 03/24/2014   Procedure: FLEXIBLE SIGMOIDOSCOPY;  Surgeon: Elspeth KYM Schultze, MD;  Location: [facility];  Service: General;  Laterality: N/A;   HIP FRACTURE SURGERY Left age 20 on 04-06-1970   ILEOSTOMY     ILEOSTOMY CLOSURE N/A 08/25/2014   Procedure: TAKEDOWN OF LOOP ILEOSTOMY ;  Surgeon: Elspeth Schultze, MD;  Location: [facility];  Service: General;  Laterality: N/A;   INSERTION OF MESH N/A 04/11/2017   Procedure: INSERTION OF MESH ;  Surgeon: Schultze Elspeth, MD;  Location: [facility];  Service: General;  Laterality: N/A;  BILATERAL TAP BLOCK   JOINT REPLACEMENT  10/2015   POLYPECTOMY  05/28/2023   Procedure: POLYPECTOMY;  Surgeon: Cindie Carlin POUR, DO;  [facility];  Service: Endoscopy;;   THULIUM LASER TURP (TRANSURETHRAL RESECTION OF PROSTATE) N/A 04/04/2022   Procedure: THULIUM LASER TURP (TRANSURETHRAL RESECTION OF PROSTATE);  Surgeon: Nieves Cough, MD;  Location: [facility];  Service: Urology;  Laterality: N/A;   TOTAL HIP ARTHROPLASTY Left 11/19/2015   Procedure: LEFT TOTAL HIP ARTHROPLASTY ANTERIOR APPROACH;   Surgeon: Lonni CINDERELLA Poli, MD;  Location: [facility];  Service: Orthopedics;  Laterality: Left;   TRANSURETHRAL RESECTION OF PROSTATE N/A 09/24/2023   Procedure: TRANSURETHRAL RESECTION OF THE PROSTATE (TURP);  Surgeon: Sherrilee Belvie CROME, MD;  Location: [facility];  Service: Urology;  Laterality: N/A;   VENTRAL HERNIA REPAIR N/A 04/11/2017   Procedure: LAPAROSCOPIC INCARCERATED PERISTOMAL AND INCARCERATED INCISIONAL HERNIA X2;  Surgeon: Schultze Elspeth, MD;  Location: [facility];  Service: General;  Laterality: N/A;  BILATERAL TAP BLOCK    Allergies: Allergies as of 06/27/2024 - Review Complete 06/27/2024  Allergen Reaction Noted   Oxycodone   06/11/2024    Medications: Outpatient Encounter Medications as of 06/27/2024  Medication Sig   amLODipine  (NORVASC ) 5 MG tablet Take 1 tablet (5 mg total) by mouth daily.   aspirin  EC 81 MG tablet Take 81 mg by mouth daily.   baclofen  (LIORESAL ) 10 MG tablet Take 1 tablet (10 mg total) by mouth at bedtime as needed for muscle spasms. For leg cramping   chlorthalidone  (HYGROTON ) 25 MG tablet Take 0.5 tablets (12.5 mg total) by mouth daily.   metoprolol  tartrate (LOPRESSOR ) 25 MG tablet  Take 1 tablet (25 mg total) by mouth 2 (two) times daily.   Multiple Vitamins-Minerals (PRESERVISION AREDS 2 PO) Take 1 capsule by mouth in the morning and at bedtime.   pantoprazole  (PROTONIX ) 40 MG tablet Take 1 tablet (40 mg total) by mouth 2 (two) times daily before a meal.   pregabalin (LYRICA) 50 MG capsule Take 1 capsule (50 mg total) by mouth 2 (two) times daily.   rosuvastatin  (CRESTOR ) 10 MG tablet Take 1 tablet (10 mg total) by mouth daily.   Turmeric 500 MG CAPS Take 500 mg by mouth 2 (two) times daily.    vitamin B-12 (CYANOCOBALAMIN ) 500 MCG tablet Take 1,000 mcg by mouth daily.   vitamin C  (ASCORBIC ACID ) 500 MG tablet Take 500 mg by mouth 2 (two) times daily.   [DISCONTINUED] methylPREDNISolone  (MEDROL  DOSEPAK) 4 MG TBPK tablet Take by mouth daily, taper daily  dose per package instructions.   No facility-administered encounter medications on file as of 06/27/2024.    Social History: Social History   Tobacco Use   Smoking status: Former    Types: Cigars    Quit date: 2012    Years since quitting: 13.8   Smokeless tobacco: Never  Vaping Use   Vaping status: Never Used  Substance Use Topics   Alcohol use: No    Comment: quit years ago   Drug use: No    Family Medical History: Family History  Problem Relation Age of Onset   Colon cancer Sister    Emphysema Mother    Heart attack Brother    Colon polyps Neg Hx     Physical Examination: NEUROLOGICAL:     Awake, alert, oriented to person, place, and time.  Speech is clear and fluent. Fund of knowledge is appropriate.   Cranial Nerves: Pupils equal round and reactive to light.  Facial tone is symmetric.    Tinel of right ulnar nerve. decreasd sensation of ulnar nerve distribution in right hand. Positive Wartenberg, significantly decreased intrinsic weakness of the right hand-specifically in ADM. He is 2/5 in ADM and interossei. Weakness noted in FCU 2-3/5 Hoffman's is absent.  Clonus is not present.  Toes are down-going.  Almost complete loss of sensation in the ulnar nerve distribution  Medical Decision Making  Imaging: EXAM: CERVICAL SPINE - COMPLETE 4+ VIEW   COMPARISON:  08/04/2005   FINDINGS: Marked degenerative changes at the C5-6 level with marked progression. Moderate degenerative changes at the C6-7 level with mild progression.   Uncinate spurs producing moderate foraminal stenosis on the right at the C5-6 and C6-7 levels and mild-to-moderate foraminal stenosis on the left at the C5-6 level. There are also uncinate spurs on the left at the C6-7 level but the foramina is not adequately visualized to assess stenosis due to inadequate rotation on the left posterior oblique view.   No prevertebral soft tissue swelling, fractures or subluxations.    IMPRESSION: 1. No fracture or subluxation. 2. Marked degenerative changes at the C5-6 level with marked progression. 3. Moderate degenerative changes at the C6-7 level with mild progression. 4. Bilateral foraminal stenosis at the C5-6 and C6-7 levels.    I have personally reviewed the images and agree with the above interpretation.    [facility]  [address] [place], [address]  [place], [place] [address] Tel: [phone] Fax: [phone] Test Date:  06/23/2024   Patient: [name] DOB: 02-26-1956 Physician: Venetia Potters, MD  Sex: Male Height: 5' 5 Ref Phys: Lyle Decamp, PA-C  ID#: [id number]     Technician:      History: This is a 68 year old male with right arm numbness, tingling, and weakness.   NCV & EMG Findings: Extensive electrodiagnostic evaluation of the right upper limb shows: Right ulnar sensory response is absent. Right median sensory response shows prolonged distal peak latency (3.9 ms). Right radial sensory response is within normal limits. Right ulnar (ADM) motor response shows reduced amplitude (1.19 mV) and decreased conduction velocity from above elbow to below elbow stimulation sites (34 m/s). Right median (AP) motor response shows reduced amplitude (4.1 mV). Active denervation changes and reduced recruitment pattern WITHOUT chronic motor axon loss changes are seen in the right first dorsal interosseous muscle. Chronic motor loss changes WITH accompanying active denervation are seen in the right abductor digiti minimi and flexor digitorum profundus to digits 4,5 muscles. Chronic motor axon loss changes WITHOUT accompanying active denervation changes are seen in the right extensor indicis proprius and cervical paraspinal (C7 level) muscles.   Neuromuscular Ultrasound Findings: High frequency (4.0-16.0 MHz) B-mode, nonvascular ultrasound of the right upper limb shows: Cross sectional areas (CSA) of the right ulnar nerve is increased at the elbow  (20.0 mm2). Nerve is hypoechoic, and there is loss of normal fascicular architecture at the elbow. In long axis view, there is a hyperechoic appearing lesion within the nerve with potential hypoechoic shadowing deep to the nerve. The right ulnar nerve subluxes and dislocates from the ulnar groove with maximal elbow flexion. No other obvious lesion involving the adjacent bone or tendon is identified. No definite vascular abnormalities.   Impression: This is an abnormal study. The findings are most consistent with the following: Right ulnar mononeuropathy at the elbow, with active/ongoing denervation changes, severe in degree electrically. Neuromuscular ultrasound findings of hyperechoic area in right ulnar nerve at the elbow could represent calcification or scar among other possibilities. Overlapping residuals of an old intraspinal canal lesion (ie: motor radiculopathy) at the right C8 root or segment, which is difficult to grade the severity due to #1 above. Right median mononeuropathy at or distal to the wrist, consistent with carpal tunnel syndrome, mild in degree electrically.     ___________________________ Venetia Potters, MD Assessment and Plan Assessment & Plan Right ulnar neuropathy with severe motor and sensory deficits and chronic pain Severe right ulnar neuropathy with significant motor and sensory deficits, including muscle wasting, weakness, and near complete loss of sensation. Chronic pain persists despite medication. EMG and nerve ultrasound indicate severe neuropathy with hyperechoic area at the elbow, suggesting possible hemorrhage calcification or bruise. Condition likely worsened post-fall on August 20th, 2025. Developing claw deformity.  - Perform surgical exploration and decompression of the right ulnar nerve to remove scar tissue and ensure no compression. - Consider intraoperative nerve stimulation to improve nerve function postop - Consider nerve stimulator placement if pain  persists, pending insurance coverage. - Discuss potential nerve transfer from pronator quadratus to ulnar nerve if symptoms persist or worsen.  [name] Sharps MD Dept. of Neurosurgery

## 2024-06-27 NOTE — Progress Notes (Unsigned)
 Referring Physician:  Grooms, Charmaine, PA-C [address] [place],  [place] [address]  Primary Physician:  Cook, Jayce G, DO  History of Present Illness: 06/27/2024 Discuss surgery   History of Present Illness: 06/11/2024 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  Patient initially had pain under his armpit and this has migrated down his arm.  He is having extreme neuropathic pain in this arm that radiates into his pinky finger and half of his ring finger.  This has been unrelieved with gabapentin .  He has noticed new weakness in his right hand and the inability to bring his pinky and towards the rest of his hand.   Weakness: none  Bowel/Bladder Dysfunction: none  Conservative measures:  Physical therapy:  has not participated in Multimodal medical therapy including regular antiinflammatories:  Tramadol , Prednisone , Gabapentin , Ibuprofen, Naproxen , Tizanidine  Injections:  no epidural steroid injections  Past Surgery: no spine surgery  [name] has no symptoms of cervical myelopathy.  The symptoms are causing a significant impact on the patient's life.   Review of Systems:  A 10 point review of systems is negative, except for the pertinent positives and negatives detailed in the HPI.  Past Medical History: Past Medical History:  Diagnosis Date   Arthritis    hands and shoulders (left shoulder is worse than right)   Aspiration pneumonia (HCC) 03/24/2014   after surgery for colostomy   BPH (benign prostatic hyperplasia)    Colostomy in place [facility])    Complication of anesthesia 03/24/2014   aspiration pneumonia after surgery for colostomy, no problem with  surgeries since then   Difficulty urinating    Dyspnea    @ times due to hernias, seeing [facility] for  surgical consultation 04-20-2022, can lie flat   GERD (gastroesophageal reflux  disease)    History of kidney stones    Hypertension    Ileostomy present (HCC)    Incisional hernia    right abdomen   Parastomal hernia left    Rectal cancer, uT2uN0, s/p LAR/ileostomy 03/20/2014 colostomy Aug2015 12/25/2013   TCS MAY 2015    Restless leg    Right inguinal hernia    Shortening, leg, congenital    Left   Skin irritation    Surrounding ostomy site    Past Surgical History: Past Surgical History:  Procedure Laterality Date   COLON RESECTION N/A 03/24/2014   Procedure: Diagnostic  laparotomy, takedown of coloanal anastamosis, end colostomy, with wound vac;  Surgeon: Elspeth KYM Schultze, MD;  [facility]: [facility];  Service: General;  Laterality: N/A;   COLON SURGERY  02/2014   COLONOSCOPY N/A 12/22/2013   SLF: 1. Normal mucosa in the terminal ilium. 2. Moderate diverticulosis in the transverse colon, descending colon, and sigmoid colon. 3. Rectal bleeding due ot rectal mass and hemorrhoids 4. Moderate sized internal hemorrhoids.    COLONOSCOPY N/A 01/19/2015   Procedure: COLONOSCOPY;  Surgeon: Margo LITTIE Haddock, MD;  [facility];  Service: Endoscopy;  Laterality: N/A;  1215   COLONOSCOPY N/A 03/22/2018   Procedure: COLONOSCOPY;  Surgeon: Haddock Margo LITTIE, MD;  [facility];  Service: Endoscopy;  Laterality: N/A;  2:15pm   COLONOSCOPY WITH PROPOFOL  N/A 05/28/2023   Procedure: COLONOSCOPY WITH PROPOFOL ;  Surgeon: Cindie Carlin POUR, DO;  [facility];  Service: Endoscopy;  Laterality: N/A;  1:00 pm, asa 3  COLOSTOMY     03-24-2014   CYSTOSCOPY N/A 09/24/2023   Procedure: CYSTOSCOPY;  Surgeon: Sherrilee Belvie CROME, MD;  Location: [facility];  Service: Urology;  Laterality: N/A;   ESOPHAGOGASTRODUODENOSCOPY N/A 12/22/2013   SLF: Probable Candia esophagitis2. Probable Barretts esphagus 3. Mild gastritis.    EUS N/A 01/01/2014   Procedure: LOWER ENDOSCOPIC ULTRASOUND (EUS);  Surgeon: Toribio SHAUNNA Cedar, MD;  Location: [facility];  Service: Endoscopy;   Laterality: N/A;   EXAMINATION UNDER ANESTHESIA  08/25/2014   Procedure: EXAM UNDER ANESTHESIA;  Surgeon: Elspeth Schultze, MD;  [facility];  Service: General;;   EXTRACORPOREAL SHOCK WAVE LITHOTRIPSY Right 07/25/2022   Procedure: EXTRACORPOREAL SHOCK WAVE LITHOTRIPSY (ESWL);  Surgeon: Sherrilee Belvie CROME, MD;  [facility];  Service: Urology;  Laterality: Right;   eye reattachment and ear reattachment  Right 08/21/1969   FLEXIBLE SIGMOIDOSCOPY N/A 03/24/2014   Procedure: FLEXIBLE SIGMOIDOSCOPY;  Surgeon: Elspeth KYM Schultze, MD;  [facility];  Service: General;  Laterality: N/A;   HIP FRACTURE SURGERY Left age 30 on 04-06-1970   ILEOSTOMY     ILEOSTOMY CLOSURE N/A 08/25/2014   Procedure: TAKEDOWN OF LOOP ILEOSTOMY ;  Surgeon: Elspeth Schultze, MD;  [facility];  Service: General;  Laterality: N/A;   INSERTION OF MESH N/A 04/11/2017   Procedure: INSERTION OF MESH ;  Surgeon: Schultze Elspeth, MD;  [facility];  Service: General;  Laterality: N/A;  BILATERAL TAP BLOCK   JOINT REPLACEMENT  10/2015   POLYPECTOMY  05/28/2023   Procedure: POLYPECTOMY;  Surgeon: Cindie Carlin POUR, DO;  [facility];  Service: Endoscopy;;   THULIUM LASER TURP (TRANSURETHRAL RESECTION OF PROSTATE) N/A 04/04/2022   Procedure: THULIUM LASER TURP (TRANSURETHRAL RESECTION OF PROSTATE);  Surgeon: Nieves Cough, MD;  Location: [facility];  Service: Urology;  Laterality: N/A;   TOTAL HIP ARTHROPLASTY Left 11/19/2015   Procedure: LEFT TOTAL HIP ARTHROPLASTY ANTERIOR APPROACH;  Surgeon: Lonni CINDERELLA Poli, MD;  [facility];  Service: Orthopedics;  Laterality: Left;   TRANSURETHRAL RESECTION OF PROSTATE N/A 09/24/2023   Procedure: TRANSURETHRAL RESECTION OF THE PROSTATE (TURP);  Surgeon: Sherrilee Belvie CROME, MD;  [facility];  Service: Urology;  Laterality: N/A;   VENTRAL HERNIA REPAIR N/A 04/11/2017   Procedure: LAPAROSCOPIC INCARCERATED PERISTOMAL AND INCARCERATED  INCISIONAL HERNIA X2;  Surgeon: Schultze Elspeth, MD;  [facility];  Service: General;  Laterality: N/A;  BILATERAL TAP BLOCK    Allergies: Allergies as of 06/11/2024 - Review Complete 06/11/2024  Allergen Reaction Noted   Oxycodone   06/11/2024    Medications: Outpatient Encounter Medications as of 06/11/2024  Medication Sig   amLODipine  (NORVASC ) 5 MG tablet Take 1 tablet (5 mg total) by mouth daily.   aspirin  EC 81 MG tablet Take 81 mg by mouth daily.   baclofen  (LIORESAL ) 10 MG tablet Take 1 tablet (10 mg total) by mouth at bedtime as needed for muscle spasms. For leg cramping   chlorthalidone  (HYGROTON ) 25 MG tablet Take 0.5 tablets (12.5 mg total) by mouth daily.   gabapentin  (NEURONTIN ) 100 MG capsule Take 2 capsules (200 mg total) by mouth 3 (three) times daily.   metoprolol  tartrate (LOPRESSOR ) 25 MG tablet Take 1 tablet (25 mg total) by mouth 2 (two) times daily.   Multiple Vitamins-Minerals (PRESERVISION AREDS 2 PO) Take 1 capsule by mouth in the morning and at bedtime.   pantoprazole  (PROTONIX ) 40 MG tablet Take 1 tablet (40 mg total) by mouth 2 (two) times daily  before a meal.   rosuvastatin  (CRESTOR ) 10 MG tablet Take 1 tablet (10 mg total) by mouth daily.   Turmeric 500 MG CAPS Take 500 mg by mouth 2 (two) times daily.    vitamin B-12 (CYANOCOBALAMIN ) 500 MCG tablet Take 1,000 mcg by mouth daily.   vitamin C  (ASCORBIC ACID ) 500 MG tablet Take 500 mg by mouth 2 (two) times daily.   [DISCONTINUED] APPLE CIDER VINEGAR PO Take 1 tablet by mouth in the morning and at bedtime. (Patient not taking: Reported on 06/11/2024)   [DISCONTINUED] Beta Carotene (VITAMIN A) 25000 UNIT capsule Take 25,000 Units by mouth daily. (Patient not taking: Reported on 06/11/2024)   [DISCONTINUED] co-enzyme Q-10 30 MG capsule Take 30 mg by mouth daily. (Patient not taking: Reported on 06/11/2024)   [DISCONTINUED] naproxen  (NAPROSYN ) 500 MG tablet Take 1 tablet (500 mg total) by mouth 2 (two) times  daily as needed. (Patient not taking: Reported on 06/11/2024)   [DISCONTINUED] silver  sulfADIAZINE  (SILVADENE ) 1 % cream Apply 1 Application topically daily. (Patient not taking: Reported on 06/11/2024)   [DISCONTINUED] traMADol  (ULTRAM ) 50 MG tablet Take 1 tablet (50 mg total) by mouth every 6 (six) hours as needed for moderate pain (pain score 4-6) or severe pain (pain score 7-10). (Patient not taking: Reported on 06/11/2024)   No facility-administered encounter medications on file as of 06/11/2024.    Social History: Social History   Tobacco Use   Smoking status: Former    Types: Cigars    Quit date: 2012    Years since quitting: 13.8   Smokeless tobacco: Never  Vaping Use   Vaping status: Never Used  Substance Use Topics   Alcohol use: No    Comment: quit years ago   Drug use: No    Family Medical History: Family History  Problem Relation Age of Onset   Colon cancer Sister    Emphysema Mother    Heart attack Brother    Colon polyps Neg Hx     Physical Examination: @VITALWITHPAIN @  General: Patient is well developed, well nourished, calm, collected, and in no apparent distress. Attention to examination is appropriate.  Psychiatric: Patient is non-anxious.  Head:  Pupils equal, round, and reactive to light.  ENT:  Oral mucosa appears well hydrated.  Neck:   Supple.  Full range of motion.  Respiratory: Patient is breathing without any difficulty.  Extremities: No edema.  Vascular: Palpable dorsal pedal pulses.  Skin:   On exposed skin, there are no abnormal skin lesions.  NEUROLOGICAL:     Awake, alert, oriented to person, place, and time.  Speech is clear and fluent. Fund of knowledge is appropriate.   Cranial Nerves: Pupils equal round and reactive to light.  Facial tone is symmetric.    Tinel of right ulnar nerve. Decree sensation of ulnar nerve distribution in right hand. Positive Wartenberg, significantly decreased intrinsic weakness of the right  hand-specifically in ADM.    Hoffman's is absent.  Clonus is not present.  Toes are down-going.  Bilateral upper and lower extremity sensation is intact to light touch.    Gait is normal.   No difficulty with tandem gait.   No evidence of dysmetria noted.  Medical Decision Making  Imaging: EXAM: CERVICAL SPINE - COMPLETE 4+ VIEW   COMPARISON:  08/04/2005   FINDINGS: Marked degenerative changes at the C5-6 level with marked progression. Moderate degenerative changes at the C6-7 level with mild progression.   Uncinate spurs producing moderate foraminal stenosis on the right  at the C5-6 and C6-7 levels and mild-to-moderate foraminal stenosis on the left at the C5-6 level. There are also uncinate spurs on the left at the C6-7 level but the foramina is not adequately visualized to assess stenosis due to inadequate rotation on the left posterior oblique view.   No prevertebral soft tissue swelling, fractures or subluxations.   IMPRESSION: 1. No fracture or subluxation. 2. Marked degenerative changes at the C5-6 level with marked progression. 3. Moderate degenerative changes at the C6-7 level with mild progression. 4. Bilateral foraminal stenosis at the C5-6 and C6-7 levels.    I have personally reviewed the images and agree with the above interpretation.  Assessment and Plan: Mr. Powell is a pleasant 68 y.o. 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  This is associated with migrating pain down his right upper extremity and numbness, weakness, and tingling of his fifth digit and half of his fourth..  Plan moving forward includes the following:  -Medrol  Dosepak for pain - Neuropathic pain medication changed to Lyrica - X-ray of right elbow today just to rule out fracture from the fall - Would like patient to undergo urgent EMG for evaluation. - Discussed with patient possible need of exploration or decompression of his right  ulnar nerve in the future.   Thank you for involving me in the care of this patient.    Lyle Decamp, PA-C Dept. of Neurosurgery

## 2024-06-27 NOTE — Telephone Encounter (Signed)
 Called patient to discuss scheduling an appointment. Patient able to come in today at 1pm to see Dr. Claudene to discuss surgery.

## 2024-07-06 ENCOUNTER — Ambulatory Visit: Payer: Self-pay | Admitting: Neurosurgery

## 2024-07-06 DIAGNOSIS — R202 Paresthesia of skin: Secondary | ICD-10-CM

## 2024-07-06 DIAGNOSIS — G8929 Other chronic pain: Secondary | ICD-10-CM | POA: Insufficient documentation

## 2024-07-06 DIAGNOSIS — G5621 Lesion of ulnar nerve, right upper limb: Secondary | ICD-10-CM

## 2024-07-06 DIAGNOSIS — M25521 Pain in right elbow: Secondary | ICD-10-CM

## 2024-07-07 ENCOUNTER — Other Ambulatory Visit: Payer: Self-pay

## 2024-07-07 ENCOUNTER — Telehealth: Payer: Self-pay

## 2024-07-07 DIAGNOSIS — R200 Anesthesia of skin: Secondary | ICD-10-CM

## 2024-07-07 DIAGNOSIS — G8929 Other chronic pain: Secondary | ICD-10-CM

## 2024-07-07 DIAGNOSIS — G5621 Lesion of ulnar nerve, right upper limb: Secondary | ICD-10-CM

## 2024-07-07 DIAGNOSIS — Z01818 Encounter for other preprocedural examination: Secondary | ICD-10-CM

## 2024-07-07 DIAGNOSIS — M25521 Pain in right elbow: Secondary | ICD-10-CM

## 2024-07-07 NOTE — Telephone Encounter (Addendum)
 Patient called to discuss surgery scheduling. Discussed surgery instructions as listed below. Patient does not use MyChart, so a physical copy of these instructions was printed and placed in outgoing mail.  Please see below for information in regards to your upcoming surgery:   Planned surgery: Right Ulnar Nerve Decompression at the Elbow, Placement of Right Ulnar Nerve Stimulator   Surgery date: 07/31/24 at [facility] (Medical Mall: [address] [place], [place] [address]) - you will find out your arrival time the business day before your surgery.   Pre-op appointment at [facility] Pre-admit Testing: you will receive a call with a date/time for this appointment. If you are scheduled for an in person appointment, Pre-admit Testing is located on the first floor of the [facility] building, [address], [address]. During this appointment, they will advise you which medications you can take the morning of surgery, and which medications you will need to hold for surgery. Labs (such as blood work, EKG) may be done at your pre-op appointment. You are not required to fast for these labs. Should you need to change your pre-op appointment, please call Pre-admit testing at [phone].      Blood thinners:  Aspirin  81mg :   if taking as a preventative, stop aspirin  7 days prior, resume aspirin  14 days after      Surgical clearance: we will send a clearance form to [facility], DO. They may wish to see you in their office prior to signing the clearance form. If so, they may call you to schedule an appointment.      How to contact us :  If you have any questions/concerns before or after surgery, you can reach us  at [phone], or you can send a mychart message. We can be reached by phone or mychart 8am-4pm, Monday-Friday.  *Please note: Calls after 4pm are forwarded to a third party answering service. Mychart messages are not routinely monitored during  evenings, weekends, and holidays. Please call our office to contact the answering service for urgent concerns during non-business hours.     If you have FMLA/disability paperwork, please drop it off or fax it to [phone]   Appointments/FMLA & disability paperwork: Reche Hait, & Nichole Registered Nurse/Surgery scheduler: Kendelyn, RN & Katie, RN Certified Medical Assistants: Don, CMA, Elenor, CMA, Damien, CMA, & Auston, [place] Physician Assistants: Lyle Decamp, PA-C, Edsel Goods, PA-C & Glade Boys, PA-C Surgeons: Penne Sharps, MD & Reeves Daisy, MD    [facility] PREADMIT TESTING VISIT and SURGERY INFORMATION SHEET   Now that surgery has been scheduled you can anticipate several phone calls from [facility] services. A pharmacy technician will call you to verify your current list of medications taken at home.               The Pre-Service Center will call to verify your insurance information and to give you billing estimates and information.             The Preadmit Testing Office will be calling to schedule a visit to obtain information for the anesthesia team and provide instructions on preparation for surgery.  What can you expect for the Preadmit Testing Visit: Appointments may be scheduled in-person or by telephone.  If a telephone visit is scheduled, you may be asked to come into the office to have lab tests or other studies performed.   This visit will not be completed any greater than 14 days prior to your surgery.  If your surgery has been scheduled for a future date, please do not be alarmed if we have not contacted you to schedule an appointment more than a month prior to the surgery date.    Please be prepared to provide the following information during this appointment:            -Personal medical history                                               -Medication and allergy list            -Any history of problems with anesthesia               -Recent lab work or diagnostic studies            -Please notify us  of any needs we should be aware of to provide the best care possible           -You will be provided with instructions on how to prepare for your surgery.    On The Day of Surgery:  You must have a driver to take you home after surgery, you will be asked not to drive for 24 hours following surgery.  Taxi, Gisele and non-medical transport will not be acceptable means of transportation unless you have a responsible individual who will be traveling with you.  Visitors in the surgical area:   2 people will be able to visit you in your room once your preparation for surgery has been completed. During surgery, your visitors will be asked to wait in the Surgery Waiting Area.  It is not a requirement for them to stay, if they prefer to leave and come back.  Your visitor(s) will be given an update once the surgery has been completed.  No visitors are allowed in the initial recovery room to respect patient privacy and safety.  Once you are more awake and transfer to the secondary recovery area, or are transferred to an inpatient room, visitors will again be able to see you.  To respect and protect your privacy: We will ask on the day of surgery who your driver will be and what the contact number for that individual will be. We will ask if it is okay to share information with this individual, or if there is an alternative individual that we, or the surgeon, should contact to provide updates and information. If family or friends come to the surgical information desk requesting information about you, who you have not listed with us , no information will be given.   It may be helpful to designate someone as the main contact who will be responsible for updating your other friends and family.    PREADMIT TESTING OFFICE: [phone] SAME DAY SURGERY: [phone] We look forward to caring for you before and throughout the process  of your surgery.

## 2024-07-08 ENCOUNTER — Telehealth: Payer: Self-pay

## 2024-07-08 NOTE — Telephone Encounter (Signed)
-----   Message from [name] Sharps sent at 07/07/2024  8:19 PM EST ----- Regarding: RE: Review info re: stimulator Can offer him one of my OR day phone calls ----- Message ----- From: Danley Comer BRAVO, RN Sent: 07/07/2024  11:25 AM EST To: [name] Sharps, MD; [facility] Rn Subject: Review info re: stimulator                     Scheduled patient for 12/30 for surgery.   He wanted more information about the stimulator and how it works. I can schedule him an appointment if you need me to, just let me know!

## 2024-07-08 NOTE — Telephone Encounter (Signed)
 I spoke with [name] and scheduled a telephone appointment for him.

## 2024-07-09 ENCOUNTER — Other Ambulatory Visit: Payer: Self-pay | Admitting: Cardiology

## 2024-07-10 ENCOUNTER — Ambulatory Visit: Admitting: Neurosurgery

## 2024-07-10 DIAGNOSIS — G5621 Lesion of ulnar nerve, right upper limb: Secondary | ICD-10-CM

## 2024-07-11 NOTE — Telephone Encounter (Signed)
 Updated surgery date to 12/11. Notified Stimulator rep and patient. Changed post op appointments.

## 2024-07-15 NOTE — Progress Notes (Signed)
 I had a follow-up phone visit with [name].  He was at home and I was in the office.  He gave consent to go forward with a phone visit.  We discussed his ulnar neuropathy and ulnar neuritis/nerve pain.  This has been chronic nerve pain with chronic neuropathic pain in his right upper extremity.  Secondary to trauma.  We discussed his decompression which he like to go forward with.  He had more questions about the peripheral nerve stimulator.  He was hopeful that he would be able to get approved for the nerve stimulator given his chronic nerve pain, we could place it in an open fashion during the decompression.  He would like to go forward with that procedure if approved.  Penne MICAEL Sharps, MD

## 2024-07-16 ENCOUNTER — Telehealth: Payer: Self-pay

## 2024-07-16 NOTE — Telephone Encounter (Signed)
 I received a message from Neurosurgery at [facility] stating they are needing the patient's surgical clearance form completed, which is also located under letters if its not sent to your inbox, please send to front if an appt will be needed.

## 2024-07-18 ENCOUNTER — Other Ambulatory Visit: Payer: Self-pay | Admitting: Family Medicine

## 2024-07-19 ENCOUNTER — Other Ambulatory Visit: Payer: Self-pay | Admitting: Family Medicine

## 2024-07-24 ENCOUNTER — Other Ambulatory Visit: Payer: Self-pay

## 2024-07-24 ENCOUNTER — Telehealth: Payer: Self-pay | Admitting: *Deleted

## 2024-07-24 ENCOUNTER — Inpatient Hospital Stay
Admission: RE | Admit: 2024-07-24 | Discharge: 2024-07-24 | Disposition: A | Source: Ambulatory Visit | Attending: Neurosurgery | Admitting: Neurosurgery

## 2024-07-24 DIAGNOSIS — G5621 Lesion of ulnar nerve, right upper limb: Secondary | ICD-10-CM

## 2024-07-24 DIAGNOSIS — Z01812 Encounter for preprocedural laboratory examination: Secondary | ICD-10-CM

## 2024-07-24 DIAGNOSIS — I10 Essential (primary) hypertension: Secondary | ICD-10-CM

## 2024-07-24 DIAGNOSIS — Z79899 Other long term (current) drug therapy: Secondary | ICD-10-CM

## 2024-07-24 DIAGNOSIS — E785 Hyperlipidemia, unspecified: Secondary | ICD-10-CM

## 2024-07-24 HISTORY — DX: Lesion of ulnar nerve, right upper limb: G56.21

## 2024-07-24 HISTORY — DX: Male erectile dysfunction, unspecified: N52.9

## 2024-07-24 HISTORY — DX: Hyperlipidemia, unspecified: E78.5

## 2024-07-24 NOTE — Patient Instructions (Signed)
 Your procedure is scheduled on:07-31-24 Thursday Report to the Registration Desk on the 1st floor of the [facility].Then proceed to the 2nd floor Surgery Desk To find out your arrival time, please call [phone] between 1PM - 3PM on:07-30-24 Wednesday If your arrival time is 6:00 am, do not arrive before that time as the [facility] entrance doors do not open until 6:00 am.  REMEMBER: Instructions that are not followed completely may result in serious medical risk, up to and including death; or upon the discretion of your surgeon and anesthesiologist your surgery may need to be rescheduled.  Do not eat food after midnight the night before surgery.  No gum chewing or hard candies.  You may however, drink CLEAR liquids up to 2 hours before you are scheduled to arrive for your surgery. Do not drink anything within 2 hours of your scheduled arrival time.  Clear liquids include: - water   - apple juice without pulp - gatorade (not RED colors) - black coffee or tea (Do NOT add milk or creamers to the coffee or tea) Do NOT drink anything that is not on this list.  One week prior to surgery:Stop NOW (07-24-24) Stop ANY OVER THE COUNTER supplements until after surgery (CoQ10-Fish Oil, Preservision AREDS, Turmeric, Vitamin B 12, Vitamin C )  Stop your 81 mg Aspirin  7 days prior to surgery-Stop NOW (07-24-24) Thursday  Continue taking all of your other prescription medications up until the day of surgery.  ON THE DAY OF SURGERY ONLY TAKE THESE MEDICATIONS WITH SIPS OF WATER : -amLODipine  (NORVASC )  -metoprolol  tartrate (LOPRESSOR )  -pantoprazole  (PROTONIX )   No Alcohol for 24 hours before or after surgery.  No Smoking including e-cigarettes for 24 hours before surgery.  No chewable tobacco products for at least 6 hours before surgery.  No nicotine patches on the day of surgery.  Do not use any recreational drugs for at least a week (preferably 2 weeks) before your surgery.  Please be  advised that the combination of cocaine and anesthesia may have negative outcomes, up to and including death. If you test positive for cocaine, your surgery will be cancelled.  On the morning of surgery brush your teeth with toothpaste and water , you may rinse your mouth with mouthwash if you wish. Do not swallow any toothpaste or mouthwash.  Use CHG Soap as directed on instruction sheet.  Do not wear jewelry, make-up, hairpins, clips or nail polish.  For welded (permanent) jewelry: bracelets, anklets, waist bands, etc.  Please have this removed prior to surgery.  If it is not removed, there is a chance that hospital personnel will need to cut it off on the day of surgery.  Do not wear lotions, powders, or perfumes.   Do not shave body hair from the neck down 48 hours before surgery.  Contact lenses, hearing aids and dentures may not be worn into surgery.  Do not bring valuables to the hospital. [facility] is not responsible for any missing/lost belongings or valuables.   Notify your doctor if there is any change in your medical condition (cold, fever, infection).  Wear comfortable clothing (specific to your surgery type) to the hospital.  After surgery, you can help prevent lung complications by doing breathing exercises.  Take deep breaths and cough every 1-2 hours. Your doctor may order a device called an Incentive Spirometer to help you take deep breaths. When coughing or sneezing, hold a pillow firmly against your incision with both hands. This is called "splinting." Doing this  helps protect your incision. It also decreases belly discomfort.  If you are being admitted to the hospital overnight, leave your suitcase in the car. After surgery it may be brought to your room.  In case of increased patient census, it may be necessary for you, the patient, to continue your postoperative care in the Same Day Surgery department.  If you are being discharged the day of surgery, you will  not be allowed to drive home. You will need a responsible individual to drive you home and stay with you for 24 hours after surgery.   If you are taking public transportation, you will need to have a responsible individual with you.  Please call the Pre-admissions Testing Dept. at [phone] if you have any questions about these instructions.  Surgery Visitation Policy:  Patients having surgery or a procedure may have two visitors.  Children under the age of 48 must have an adult with them who is not the patient.                                                                                                             Preparing for Surgery with CHLORHEXIDINE  GLUCONATE (CHG) Soap  Chlorhexidine  Gluconate (CHG) Soap  o An antiseptic cleaner that kills germs and bonds with the skin to continue killing germs even after washing  o Used for showering the night before surgery and morning of surgery  Before surgery, you can play an important role by reducing the number of germs on your skin.  CHG (Chlorhexidine  gluconate) soap is an antiseptic cleanser which kills germs and bonds with the skin to continue killing germs even after washing.  Please do not use if you have an allergy to CHG or antibacterial soaps. If your skin becomes reddened/irritated stop using the CHG.  1. Shower the NIGHT BEFORE SURGERY with CHG soap.  2. If you choose to wash your hair, wash your hair first as usual with your normal shampoo.  3. After shampooing, rinse your hair and body thoroughly to remove the shampoo.  4. Use CHG as you would any other liquid soap. You can apply CHG directly to the skin and wash gently with a clean washcloth.  5. Apply the CHG soap to your body only from the neck down. Do not use on open wounds or open sores. Avoid contact with your eyes, ears, mouth, and genitals (private parts). Wash face and genitals (private parts) with your normal soap.  6. Wash thoroughly, paying special  attention to the area where your surgery will be performed.  7. Thoroughly rinse your body with warm water .  8. Do not shower/wash with your normal soap after using and rinsing off the CHG soap.  9. Do not use lotions, oils, etc., after showering with CHG.  10. Pat yourself dry with a clean towel.  11. Wear clean pajamas to bed the night before surgery.  12. Place clean sheets on your bed the night of your shower and do not sleep with pets.  13. Do not apply any  deodorants/lotions/powders.  14. Please wear clean clothes to the hospital.  15. Remember to brush your teeth with your regular toothpaste.   Merchandiser, Retail to address health-related social needs:  [email or web address]

## 2024-07-24 NOTE — Telephone Encounter (Signed)
 Patient called back to schedule appt. States call was dropped. He is scheduled for surgery 12/11 - not showing any openings before then. Patient has pre op for surgery scheduled for Monday 12/8. States he has been waiting on this since August. Everything else is done. Thank You

## 2024-07-24 NOTE — Telephone Encounter (Signed)
 Copied from CRM [id number]. Topic: General - Other >> Jul 23, 2024  3:34 PM Thersia C wrote: Reason for CRM: Izetta from [facility] check in on surgical clearance letter, wanting to know the status regarding this and if patient needs appointment before surgery  [phone]

## 2024-07-28 ENCOUNTER — Inpatient Hospital Stay: Admission: RE | Admit: 2024-07-28 | Discharge: 2024-07-28 | Attending: Neurosurgery | Admitting: Neurosurgery

## 2024-07-28 ENCOUNTER — Telehealth: Payer: Self-pay | Admitting: *Deleted

## 2024-07-28 ENCOUNTER — Telehealth: Payer: Self-pay

## 2024-07-28 DIAGNOSIS — I10 Essential (primary) hypertension: Secondary | ICD-10-CM

## 2024-07-28 DIAGNOSIS — Z79899 Other long term (current) drug therapy: Secondary | ICD-10-CM

## 2024-07-28 DIAGNOSIS — Z01812 Encounter for preprocedural laboratory examination: Secondary | ICD-10-CM

## 2024-07-28 DIAGNOSIS — E785 Hyperlipidemia, unspecified: Secondary | ICD-10-CM

## 2024-07-28 LAB — CBC
HCT: 45.2 % (ref 39.0–52.0)
Hemoglobin: 14.8 g/dL (ref 13.0–17.0)
MCH: 29.4 pg (ref 26.0–34.0)
MCHC: 32.7 g/dL (ref 30.0–36.0)
MCV: 89.7 fL (ref 80.0–100.0)
Platelets: 339 K/uL (ref 150–400)
RBC: 5.04 MIL/uL (ref 4.22–5.81)
RDW: 14.6 % (ref 11.5–15.5)
WBC: 9.1 K/uL (ref 4.0–10.5)
nRBC: 0 % (ref 0.0–0.2)

## 2024-07-28 LAB — BASIC METABOLIC PANEL WITH GFR
Anion gap: 8 (ref 5–15)
BUN: 20 mg/dL (ref 8–23)
CO2: 31 mmol/L (ref 22–32)
Calcium: 9.6 mg/dL (ref 8.9–10.3)
Chloride: 101 mmol/L (ref 98–111)
Creatinine, Ser: 0.83 mg/dL (ref 0.61–1.24)
GFR, Estimated: >60 mL/min (ref >60)
Glucose, Bld: 75 mg/dL (ref 70–99)
Potassium: 3.8 mmol/L (ref 3.5–5.1)
Sodium: 139 mmol/L (ref 135–145)

## 2024-07-28 NOTE — Telephone Encounter (Signed)
 At this time, [name] has not received an appointment for surgery clearance. Per Dorise Pereyra, NP, he thinks we are fine without it. I have updated the patient and he was very thankful and appreciative.

## 2024-07-28 NOTE — Telephone Encounter (Unsigned)
 Copied from CRM [id number]. Topic: Appointments - Appointment Scheduling >> Jul 24, 2024  4:38 PM Winona R wrote: Pt was just notified he needs pre surgery clerance before 12/11

## 2024-07-29 NOTE — Telephone Encounter (Signed)
 Appointment scheduled 07/30/2024 at 3:50 PM with Dr Bluford

## 2024-07-30 ENCOUNTER — Ambulatory Visit: Admitting: Family Medicine

## 2024-07-30 VITALS — BP 127/91 | HR 86 | Temp 99.1°F | Ht 65.0 in | Wt 187.0 lb

## 2024-07-30 DIAGNOSIS — Z933 Colostomy status: Secondary | ICD-10-CM

## 2024-07-30 DIAGNOSIS — Z01818 Encounter for other preprocedural examination: Secondary | ICD-10-CM | POA: Insufficient documentation

## 2024-07-30 DIAGNOSIS — Z85048 Personal history of other malignant neoplasm of rectum, rectosigmoid junction, and anus: Secondary | ICD-10-CM | POA: Insufficient documentation

## 2024-07-30 MED ORDER — LACTATED RINGERS IV SOLN: INTRAVENOUS | Status: DC

## 2024-07-30 MED ORDER — ORAL CARE MOUTH RINSE: 15.0000 mL | Freq: Once | OROMUCOSAL | Status: AC

## 2024-07-30 MED ORDER — CEFAZOLIN IN SODIUM CHLORIDE 2-0.9 GM/100ML-% IV SOLN
2.0000 g | Freq: Once | INTRAVENOUS | Status: AC
  Administered 2024-07-31: 2 g via INTRAVENOUS
  Filled 2024-07-30: qty 100

## 2024-07-30 MED ORDER — CHLORHEXIDINE GLUCONATE 0.12 % MT SOLN
15.0000 mL | Freq: Once | OROMUCOSAL | Status: AC
  Administered 2024-07-31: 15 mL via OROMUCOSAL

## 2024-07-30 NOTE — Patient Instructions (Signed)
 Cleared for surgery.  Take care  Dr. Bluford

## 2024-07-30 NOTE — Progress Notes (Signed)
 Subjective:  Patient ID: [name], male    DOB: 07-08-1956  Age: 68 y.o. MRN: [id number]  CC:   Chief Complaint  Patient presents with   Surgical Clearance    Patient is here for surgical clearance     HPI:  68 year old male presents for surgical clearance. He is scheduled to have right ulnar nerve decompression with placement of nerve stimulator.  He is overall feeling well. BP decently controlled. Needs exam and form filled out today. No chest pain or SOB. He is very active at baseline.  Patient Active Problem List   Diagnosis Date Noted   History of rectal cancer 07/30/2024   Preoperative examination 07/30/2024   Right elbow pain 06/27/2024   Ulnar neuropathy at elbow, right 06/27/2024   Skin cyst 04/01/2024   Kidney stones 01/23/2023   Erectile dysfunction 01/23/2023   Benign prostatic hyperplasia 01/23/2023   DOE (dyspnea on exertion) 10/10/2022   Hyperlipidemia 09/14/2021   Colostomy in place [facility])    Essential hypertension, benign 05/12/2014   Obesity (BMI 30-39.9) 03/24/2014   GERD (gastroesophageal reflux disease) 11/27/2013    Social Hx   Social History   Socioeconomic History   Marital status: Married    Spouse name: Not on file   Number of children: Not on file   Years of education: Not on file   Highest education level: Not on file  Occupational History   Not on file  Tobacco Use   Smoking status: Former    Types: Cigars    Quit date: 2012    Years since quitting: 13.9   Smokeless tobacco: Never  Vaping Use   Vaping status: Never Used  Substance and Sexual Activity   Alcohol use: No    Comment: quit years ago   Drug use: No   Sexual activity: Not on file  Other Topics Concern   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   Social Drivers of Corporate Investment Banker Strain: Low Risk  (02/14/2022)   Overall Financial Resource Strain (CARDIA)    Difficulty of Paying Living Expenses: Not hard at  all  Food Insecurity: No Food Insecurity (09/24/2023)   Hunger Vital Sign    Worried About Running Out of Food in the Last Year: Never true    Ran Out of Food in the Last Year: Never true  Transportation Needs: No Transportation Needs (09/24/2023)   PRAPARE - Administrator, Civil Service (Medical): No    Lack of Transportation (Non-Medical): No  Physical Activity: Sufficiently Active (02/14/2022)   Exercise Vital Sign    Days of Exercise per Week: 5 days    Minutes of Exercise per Session: 30 min  Stress: No Stress Concern Present (02/14/2022)   [employer] of Occupational Health - Occupational Stress Questionnaire    Feeling of Stress : Not at all  Social Connections: Socially Integrated (09/24/2023)   Social Connection and Isolation Panel    Frequency of Communication with Friends and Family: More than three times a week    Frequency of Social Gatherings with Friends and Family: Not on file    Attends Religious Services: More than 4 times per year    Active Member of [employer] or Organizations: Not on file    Attends Banker Meetings: More than 4 times per year    Marital Status: Married    Review of Systems Per HPI  Objective:  BP (!) 127/91 (BP Location: Left Arm, Patient Position: Sitting)   Pulse 86   Temp 99.1 F (37.3 C)   Ht 5' 5 (1.651 m)   Wt 187 lb (84.8 kg)   SpO2 96%   BMI 31.12 kg/m      07/30/2024    3:54 PM 06/27/2024    1:10 PM 06/11/2024    2:29 PM  BP/Weight  Systolic BP 127 140 172  Diastolic BP 91 80 96  Wt. (Lbs) 187 190 188  BMI 31.12 kg/m2 31.62 kg/m2 31.28 kg/m2    Physical Exam Vitals and nursing note reviewed.  Constitutional:      General: He is not in acute distress.    Appearance: Normal appearance.  HENT:     Head: Normocephalic and atraumatic.  Cardiovascular:     Rate and Rhythm: Normal rate and regular rhythm.  Pulmonary:     Effort: Pulmonary effort is normal.     Breath sounds: Normal breath sounds.  No wheezing or rales.  Neurological:     Mental Status: He is alert.  Psychiatric:        Mood and Affect: Mood normal.        Behavior: Behavior normal.     Lab Results  Component Value Date   WBC 9.1 07/28/2024   HGB 14.8 07/28/2024   HCT 45.2 07/28/2024   PLT 339 07/28/2024   GLUCOSE 75 07/28/2024   CHOL 118 04/01/2024   TRIG 98 04/01/2024   HDL 40 04/01/2024   LDLCALC 59 04/01/2024   ALT 23 04/01/2024   AST 14 04/01/2024   NA 139 07/28/2024   K 3.8 07/28/2024   CL 101 07/28/2024   CREATININE 0.83 07/28/2024   BUN 20 07/28/2024   CO2 31 07/28/2024   TSH 2.029 10/18/2018   INR 0.99 12/25/2013   HGBA1C 5.8 (H) 04/01/2024     Assessment & Plan:  Preoperative examination Assessment & Plan: Low risk per NSQIP. Formed filled out and faxed in (copy given to patient). Green light to proceed.   Colostomy in place [facility])  History of rectal cancer    Follow-up:  No follow-ups on file.  Jacqulyn Ahle DO [facility]

## 2024-07-30 NOTE — Assessment & Plan Note (Signed)
 Low risk per NSQIP. Formed filled out and faxed in (copy given to patient). Green light to proceed.

## 2024-07-31 ENCOUNTER — Other Ambulatory Visit: Payer: Self-pay | Admitting: Neurosurgery

## 2024-07-31 ENCOUNTER — Encounter: Payer: Self-pay | Admitting: Neurosurgery

## 2024-07-31 ENCOUNTER — Encounter: Payer: Self-pay | Admitting: Urgent Care

## 2024-07-31 ENCOUNTER — Ambulatory Visit
Admission: RE | Admit: 2024-07-31 | Discharge: 2024-07-31 | Disposition: A | Source: Home / Self Care | Attending: Neurosurgery | Admitting: Neurosurgery

## 2024-07-31 ENCOUNTER — Encounter: Admission: RE | Disposition: A | Payer: Self-pay | Source: Home / Self Care | Attending: Neurosurgery

## 2024-07-31 ENCOUNTER — Ambulatory Visit: Admitting: Certified Registered"

## 2024-07-31 ENCOUNTER — Other Ambulatory Visit: Payer: Self-pay

## 2024-07-31 DIAGNOSIS — R200 Anesthesia of skin: Secondary | ICD-10-CM | POA: Insufficient documentation

## 2024-07-31 DIAGNOSIS — K219 Gastro-esophageal reflux disease without esophagitis: Secondary | ICD-10-CM | POA: Insufficient documentation

## 2024-07-31 DIAGNOSIS — G5621 Lesion of ulnar nerve, right upper limb: Secondary | ICD-10-CM | POA: Insufficient documentation

## 2024-07-31 DIAGNOSIS — G8929 Other chronic pain: Secondary | ICD-10-CM | POA: Insufficient documentation

## 2024-07-31 DIAGNOSIS — Z01818 Encounter for other preprocedural examination: Secondary | ICD-10-CM

## 2024-07-31 DIAGNOSIS — M25521 Pain in right elbow: Secondary | ICD-10-CM | POA: Diagnosis present

## 2024-07-31 DIAGNOSIS — I10 Essential (primary) hypertension: Secondary | ICD-10-CM | POA: Insufficient documentation

## 2024-07-31 DIAGNOSIS — Z87891 Personal history of nicotine dependence: Secondary | ICD-10-CM | POA: Insufficient documentation

## 2024-07-31 DIAGNOSIS — M67431 Ganglion, right wrist: Secondary | ICD-10-CM

## 2024-07-31 HISTORY — PX: ULNAR NERVE TRANSPOSITION: SHX2595

## 2024-07-31 SURGERY — ULNAR NERVE DECOMPRESSION/TRANSPOSITION
Anesthesia: General | Laterality: Right

## 2024-07-31 MED ORDER — PROPOFOL 10 MG/ML IV BOLUS
INTRAVENOUS | Status: DC | PRN
  Administered 2024-07-31: 40 mg via INTRAVENOUS

## 2024-07-31 MED ORDER — TRAMADOL HCL 50 MG PO TABS: 50.0000 mg | ORAL_TABLET | Freq: Four times a day (QID) | ORAL | 0 refills | Status: DC | PRN

## 2024-07-31 MED ORDER — FENTANYL CITRATE (PF) 100 MCG/2ML IJ SOLN: 25.0000 ug | INTRAMUSCULAR | Status: DC | PRN

## 2024-07-31 MED ORDER — MIDAZOLAM HCL 2 MG/2ML IJ SOLN
INTRAMUSCULAR | Status: AC
  Filled 2024-07-31: qty 2

## 2024-07-31 MED ORDER — PROPOFOL 500 MG/50ML IV EMUL
INTRAVENOUS | Status: DC | PRN
  Administered 2024-07-31: 150 ug/kg/min via INTRAVENOUS

## 2024-07-31 MED ORDER — FENTANYL CITRATE (PF) 100 MCG/2ML IJ SOLN
INTRAMUSCULAR | Status: AC
  Filled 2024-07-31: qty 2

## 2024-07-31 MED ORDER — CEFAZOLIN SODIUM-DEXTROSE 2-4 GM/100ML-% IV SOLN
INTRAVENOUS | Status: AC
  Filled 2024-07-31: qty 100

## 2024-07-31 MED ORDER — 0.9 % SODIUM CHLORIDE (POUR BTL) OPTIME
TOPICAL | Status: DC | PRN
  Administered 2024-07-31: 100 mL

## 2024-07-31 MED ORDER — METHYLPREDNISOLONE ACETATE 40 MG/ML IJ SUSP
INTRAMUSCULAR | Status: DC | PRN
  Administered 2024-07-31: 20 mg

## 2024-07-31 MED ORDER — CHLORHEXIDINE GLUCONATE 0.12 % MT SOLN
OROMUCOSAL | Status: AC
  Filled 2024-07-31: qty 15

## 2024-07-31 MED ORDER — BUPIVACAINE-EPINEPHRINE (PF) 0.5% -1:200000 IJ SOLN
INTRAMUSCULAR | Status: DC | PRN
  Administered 2024-07-31: 10 mL

## 2024-07-31 MED ORDER — DOCUSATE SODIUM 100 MG PO CAPS: 100.0000 mg | ORAL_CAPSULE | Freq: Two times a day (BID) | ORAL | 0 refills | Status: DC

## 2024-07-31 MED ORDER — ACETAMINOPHEN 500 MG PO TABS: 1000.0000 mg | ORAL_TABLET | Freq: Four times a day (QID) | ORAL | 0 refills | Status: DC | PRN

## 2024-07-31 MED ORDER — PROPOFOL 10 MG/ML IV BOLUS
INTRAVENOUS | Status: AC
  Filled 2024-07-31: qty 20

## 2024-07-31 MED ORDER — MIDAZOLAM HCL (PF) 2 MG/2ML IJ SOLN
INTRAMUSCULAR | Status: DC | PRN
  Administered 2024-07-31: 2 mg via INTRAVENOUS

## 2024-07-31 MED ORDER — ONDANSETRON HCL 4 MG/2ML IJ SOLN
INTRAMUSCULAR | Status: DC | PRN
  Administered 2024-07-31: 4 mg via INTRAVENOUS

## 2024-07-31 MED ORDER — LIDOCAINE HCL (CARDIAC) PF 100 MG/5ML IV SOSY
PREFILLED_SYRINGE | INTRAVENOUS | Status: DC | PRN
  Administered 2024-07-31: 80 mg via INTRAVENOUS

## 2024-07-31 MED ORDER — FENTANYL CITRATE (PF) 100 MCG/2ML IJ SOLN
INTRAMUSCULAR | Status: DC | PRN
  Administered 2024-07-31: 50 ug via INTRAVENOUS

## 2024-07-31 MED ORDER — SENNA 8.6 MG PO TABS: 1.0000 | ORAL_TABLET | Freq: Every day | ORAL | 0 refills | Status: DC

## 2024-07-31 SURGICAL SUPPLY — 29 items
BNDG COHESIVE 4X5 TAN STRL LF (GAUZE/BANDAGES/DRESSINGS) IMPLANT
BNDG GAUZE DERMACEA FLUFF 4 (GAUZE/BANDAGES/DRESSINGS) IMPLANT
BRUSH SCRUB EZ 4% CHG (MISCELLANEOUS) ×1 IMPLANT
CHLORAPREP W/TINT 26 (MISCELLANEOUS) ×1 IMPLANT
CORD BIP STRL DISP 12FT (MISCELLANEOUS) ×1 IMPLANT
DERMABOND ADVANCED .7 DNX12 (GAUZE/BANDAGES/DRESSINGS) ×1 IMPLANT
DRAPE SURG 17X11 SM STRL (DRAPES) ×2 IMPLANT
FORCEPS JEWEL BIP 4-3/4 STR (INSTRUMENTS) ×1 IMPLANT
GAUZE SPONGE 4X4 12PLY STRL (GAUZE/BANDAGES/DRESSINGS) ×1 IMPLANT
GLOVE BIOGEL PI IND STRL 6.5 (GLOVE) IMPLANT
GLOVE BIOGEL PI IND STRL 8 (GLOVE) ×2 IMPLANT
GLOVE SURG SYN 6.5 PF PI (GLOVE) IMPLANT
GLOVE SURG SYN 7.5 PF PI (GLOVE) ×1 IMPLANT
GOWN STRL REUS W/ TWL LRG LVL3 (GOWN DISPOSABLE) ×1 IMPLANT
GOWN STRL REUS W/ TWL XL LVL3 (GOWN DISPOSABLE) ×1 IMPLANT
KIT STIMROUTER NEUROMODULATION (Neurostimulator) IMPLANT
KIT TURNOVER KIT A (KITS) ×1 IMPLANT
MANIFOLD NEPTUNE II (INSTRUMENTS) ×1 IMPLANT
NS IRRIG 500ML POUR BTL (IV SOLUTION) ×1 IMPLANT
PACK EXTREMITY ARMC (MISCELLANEOUS) ×1 IMPLANT
SOLN STERILE WATER 500 ML (IV SOLUTION) IMPLANT
SPONGE KITTNER 5P (MISCELLANEOUS) IMPLANT
STOCKINETTE IMPERVIOUS 9X36 MD (GAUZE/BANDAGES/DRESSINGS) IMPLANT
SURGIFLO W/THROMBIN 8M KIT (HEMOSTASIS) IMPLANT
SUT STRATA 3-0 15 RB-1.5 (SUTURE) ×1 IMPLANT
SUT VIC AB 2-0 CT1 18 (SUTURE) IMPLANT
SUT VIC AB 2-0 SH 27XBRD (SUTURE) ×1 IMPLANT
SUT VIC AB 3-0 SH 27X BRD (SUTURE) ×1 IMPLANT
SYR 3ML LL SCALE MARK (SYRINGE) IMPLANT

## 2024-07-31 NOTE — H&P (Signed)
 Referring Physician:  Claudene Penne ORN, MD [address] [place],  [place] [address]  Primary Physician:  Cook, Jayce G, DO  History of Present Illness: 07/31/2024 Discussed the use of AI scribe software for clinical note transcription with the patient, who gave verbal consent to proceed.  History of Present Illness [name] is a 68 year old male who presents with right arm pain and weakness following a fall. He was referred by [facility] for evaluation of his arm condition.  On April 09, 2024, he fell backward in his shop, hitting his right elbow and shoulder on concrete. Initially mild, the pain intensified significantly by the next day. Since the fall, he experiences significant weakness in his right hand, especially in the fingers, and muscle wasting in the forearm. He describes a sensation of weight pulling down on his arm and severe pain impacting sleep. Pain radiates from the elbow to the fingers, worsening with pressure on the elbow.  An EMG on June 23, 2024, shows severe ulnar neuropathy with a hyperechoic area in the ulnar nerve at the elbow. Gabapentin  and prednisone  have not provided relief, and he takes five ibuprofen at night for pain management.  Bowel/Bladder Dysfunction: none  Conservative measures:  Physical therapy:  has not participated in Multimodal medical therapy including regular antiinflammatories:  Tramadol , Prednisone , Gabapentin , Ibuprofen, Naproxen , Tizanidine  Injections:  no epidural steroid injections  Past Surgery: no spine surgery  [name] Ana has no symptoms of cervical myelopathy.  The symptoms are causing a significant impact on the patient's life.   Review of Systems:  A 10 point review of systems is negative, except for the pertinent positives and negatives detailed in the HPI.  Past Medical History: Past Medical History:  Diagnosis Date   Arthritis    hands and shoulders (left shoulder is worse than right)    Aspiration pneumonia (HCC) 03/24/2014   after surgery for colostomy   BPH (benign prostatic hyperplasia)    Colostomy in place [facility])    Complication of anesthesia 03/24/2014   aspiration pneumonia after surgery for colostomy, no problem with  surgeries since then   Difficulty urinating    Dyspnea    @ times due to hernias, seeing [facility] for  surgical consultation 04-20-2022, can lie flat   ED (erectile dysfunction)    GERD (gastroesophageal reflux disease)    History of kidney stones    Hyperlipidemia    Hypertension    Ileostomy present (HCC)    Incisional hernia    right abdomen   Parastomal hernia left    Rectal cancer, uT2uN0, s/p LAR/ileostomy 03/20/2014 colostomy Aug2015 12/25/2013   TCS MAY 2015    Restless leg    Right inguinal hernia    Shortening, leg, congenital    Left   Ulnar neuropathy at elbow, right     Past Surgical History: Past Surgical History:  Procedure Laterality Date   COLON RESECTION N/A 03/24/2014   Procedure: Diagnostic  laparotomy, takedown of coloanal anastamosis, end colostomy, with wound vac;  Surgeon: Elspeth KYM Schultze, MD;  Location: [facility];  Service: General;  Laterality: N/A;   COLON SURGERY  02/2014   COLONOSCOPY N/A 12/22/2013   SLF: 1. Normal mucosa in the terminal ilium. 2. Moderate diverticulosis in the transverse colon, descending colon, and sigmoid colon. 3. Rectal bleeding due ot rectal mass and hemorrhoids 4. Moderate sized internal hemorrhoids.    COLONOSCOPY N/A 01/19/2015   Procedure: COLONOSCOPY;  Surgeon: Margo [name] Haddock, MD;  [facility];  Service: Endoscopy;  Laterality: N/A;  1215   COLONOSCOPY N/A 03/22/2018   Procedure: COLONOSCOPY;  Surgeon: Harvey Margo CROME, MD;  [facility];  Service: Endoscopy;  Laterality: N/A;  2:15pm   COLONOSCOPY WITH PROPOFOL  N/A 05/28/2023   Procedure: COLONOSCOPY WITH PROPOFOL ;  Surgeon: Cindie Carlin POUR, DO;  [facility];  Service: Endoscopy;  Laterality: N/A;   1:00 pm, asa 3   COLOSTOMY     03-24-2014   CYSTOSCOPY N/A 09/24/2023   Procedure: CYSTOSCOPY;  Surgeon: Sherrilee Belvie CROME, MD;  Location: [facility];  Service: Urology;  Laterality: N/A;   ESOPHAGOGASTRODUODENOSCOPY N/A 12/22/2013   SLF: Probable Candia esophagitis2. Probable Barretts esphagus 3. Mild gastritis.    EUS N/A 01/01/2014   Procedure: LOWER ENDOSCOPIC ULTRASOUND (EUS);  Surgeon: Toribio SHAUNNA Cedar, MD;  Location: [facility];  Service: Endoscopy;  Laterality: N/A;   EXAMINATION UNDER ANESTHESIA  08/25/2014   Procedure: EXAM UNDER ANESTHESIA;  Surgeon: Elspeth Schultze, MD;  [facility]: [facility];  Service: General;;   EXTRACORPOREAL SHOCK WAVE LITHOTRIPSY Right 07/25/2022   Procedure: EXTRACORPOREAL SHOCK WAVE LITHOTRIPSY (ESWL);  Surgeon: Sherrilee Belvie CROME, MD;  Location: [facility];  Service: Urology;  Laterality: Right;   eye reattachment and ear reattachment  Right 08/21/1969   FLEXIBLE SIGMOIDOSCOPY N/A 03/24/2014   Procedure: FLEXIBLE SIGMOIDOSCOPY;  Surgeon: Elspeth KYM Schultze, MD;  [facility];  Service: General;  Laterality: N/A;   HIP FRACTURE SURGERY Left age 11 on 04-06-1970   ILEOSTOMY     ILEOSTOMY CLOSURE N/A 08/25/2014   Procedure: TAKEDOWN OF LOOP ILEOSTOMY ;  Surgeon: Elspeth Schultze, MD;  Location: [facility];  Service: General;  Laterality: N/A;   INSERTION OF MESH N/A 04/11/2017   Procedure: INSERTION OF MESH ;  Surgeon: Schultze Elspeth, MD;  [facility];  Service: General;  Laterality: N/A;  BILATERAL TAP BLOCK   JOINT REPLACEMENT  10/2015   POLYPECTOMY  05/28/2023   Procedure: POLYPECTOMY;  Surgeon: Cindie Carlin POUR, DO;  [facility];  Service: Endoscopy;;   THULIUM LASER TURP (TRANSURETHRAL RESECTION OF PROSTATE) N/A 04/04/2022   Procedure: THULIUM LASER TURP (TRANSURETHRAL RESECTION OF PROSTATE);  Surgeon: Nieves Cough, MD;  Location[facility];  Service: Urology;  Laterality: N/A;   TOTAL HIP ARTHROPLASTY Left 11/19/2015   Procedure:  LEFT TOTAL HIP ARTHROPLASTY ANTERIOR APPROACH;  Surgeon: Lonni CINDERELLA Poli, MD;  [facility];  Service: Orthopedics;  Laterality: Left;   TRANSURETHRAL RESECTION OF PROSTATE N/A 09/24/2023   Procedure: TRANSURETHRAL RESECTION OF THE PROSTATE (TURP);  Surgeon: Sherrilee Belvie CROME, MD;  [facility];  Service: Urology;  Laterality: N/A;   VENTRAL HERNIA REPAIR N/A 04/11/2017   Procedure: LAPAROSCOPIC INCARCERATED PERISTOMAL AND INCARCERATED INCISIONAL HERNIA X2;  Surgeon: Schultze Elspeth, MD;  Location: [facility];  Service: General;  Laterality: N/A;  BILATERAL TAP BLOCK    Allergies: Allergies as of 07/07/2024 - Review Complete 06/27/2024  Allergen Reaction Noted   Oxycodone   06/11/2024    Medications: Outpatient Encounter Medications as of 06/27/2024  Medication Sig   amLODipine  (NORVASC ) 5 MG tablet Take 1 tablet (5 mg total) by mouth daily.   aspirin  EC 81 MG tablet Take 81 mg by mouth daily.   baclofen  (LIORESAL ) 10 MG tablet Take 1 tablet (10 mg total) by mouth at bedtime as needed for muscle spasms. For leg cramping   chlorthalidone  (HYGROTON ) 25 MG tablet Take 0.5 tablets (12.5 mg total) by mouth daily.  metoprolol  tartrate (LOPRESSOR ) 25 MG tablet Take 1 tablet (25 mg total) by mouth 2 (two) times daily.   Multiple Vitamins-Minerals (PRESERVISION AREDS 2 PO) Take 1 capsule by mouth in the morning and at bedtime.   pantoprazole  (PROTONIX ) 40 MG tablet Take 1 tablet (40 mg total) by mouth 2 (two) times daily before a meal.   pregabalin  (LYRICA ) 50 MG capsule Take 1 capsule (50 mg total) by mouth 2 (two) times daily.   rosuvastatin  (CRESTOR ) 10 MG tablet Take 1 tablet (10 mg total) by mouth daily.   Turmeric 500 MG CAPS Take 500 mg by mouth 2 (two) times daily.    vitamin B-12 (CYANOCOBALAMIN ) 500 MCG tablet Take 1,000 mcg by mouth daily.   vitamin C  (ASCORBIC ACID ) 500 MG tablet Take 500 mg by mouth 2 (two) times daily.   [DISCONTINUED] methylPREDNISolone  (MEDROL  DOSEPAK) 4  MG TBPK tablet Take by mouth daily, taper daily dose per package instructions.   No facility-administered encounter medications on file as of 06/27/2024.    Social History: Social History   Tobacco Use   Smoking status: Former    Types: Cigars    Quit date: 2012    Years since quitting: 13.9   Smokeless tobacco: Never  Vaping Use   Vaping status: Never Used  Substance Use Topics   Alcohol use: No    Comment: quit years ago   Drug use: No    Family Medical History: Family History  Problem Relation Age of Onset   Colon cancer Sister    Emphysema Mother    Heart attack Brother    Colon polyps Neg Hx     Physical Examination: NEUROLOGICAL:     Awake, alert, oriented to person, place, and time.  Speech is clear and fluent. Fund of knowledge is appropriate.   Cranial Nerves: Pupils equal round and reactive to light.  Facial tone is symmetric.    Tinel of right ulnar nerve. decreasd sensation of ulnar nerve distribution in right hand. Positive Wartenberg, significantly decreased intrinsic weakness of the right hand-specifically in ADM. He is 2/5 in ADM and interossei. Weakness noted in FCU 2-3/5 Hoffman's is absent.  Clonus is not present.  Toes are down-going.  Almost complete loss of sensation in the ulnar nerve distribution  Medical Decision Making  Imaging: EXAM: CERVICAL SPINE - COMPLETE 4+ VIEW   COMPARISON:  08/04/2005   FINDINGS: Marked degenerative changes at the C5-6 level with marked progression. Moderate degenerative changes at the C6-7 level with mild progression.   Uncinate spurs producing moderate foraminal stenosis on the right at the C5-6 and C6-7 levels and mild-to-moderate foraminal stenosis on the left at the C5-6 level. There are also uncinate spurs on the left at the C6-7 level but the foramina is not adequately visualized to assess stenosis due to inadequate rotation on the left posterior oblique view.   No prevertebral soft tissue  swelling, fractures or subluxations.   IMPRESSION: 1. No fracture or subluxation. 2. Marked degenerative changes at the C5-6 level with marked progression. 3. Moderate degenerative changes at the C6-7 level with mild progression. 4. Bilateral foraminal stenosis at the C5-6 and C6-7 levels.    I have personally reviewed the images and agree with the above interpretation.    [facility]  [address] [place], [address]  [place], [place] [address] Tel: [phone] Fax: [phone] Test Date:  06/23/2024   Patient: [name] DOB: 12/14/1955 Physician: Venetia Potters, MD  Sex: Male Height: 5' 5  Ref Phys: [facility] Ulis RIGGERS  ID#: [id number]     Technician:      History: This is a 68 year old male with right arm numbness, tingling, and weakness.   NCV & EMG Findings: Extensive electrodiagnostic evaluation of the right upper limb shows: Right ulnar sensory response is absent. Right median sensory response shows prolonged distal peak latency (3.9 ms). Right radial sensory response is within normal limits. Right ulnar (ADM) motor response shows reduced amplitude (1.19 mV) and decreased conduction velocity from above elbow to below elbow stimulation sites (34 m/s). Right median (AP) motor response shows reduced amplitude (4.1 mV). Active denervation changes and reduced recruitment pattern WITHOUT chronic motor axon loss changes are seen in the right first dorsal interosseous muscle. Chronic motor loss changes WITH accompanying active denervation are seen in the right abductor digiti minimi and flexor digitorum profundus to digits 4,5 muscles. Chronic motor axon loss changes WITHOUT accompanying active denervation changes are seen in the right extensor indicis proprius and cervical paraspinal (C7 level) muscles.   Neuromuscular Ultrasound Findings: High frequency (4.0-16.0 MHz) B-mode, nonvascular ultrasound of the right upper limb shows: Cross sectional areas (CSA) of the  right ulnar nerve is increased at the elbow (20.0 mm2). Nerve is hypoechoic, and there is loss of normal fascicular architecture at the elbow. In long axis view, there is a hyperechoic appearing lesion within the nerve with potential hypoechoic shadowing deep to the nerve. The right ulnar nerve subluxes and dislocates from the ulnar groove with maximal elbow flexion. No other obvious lesion involving the adjacent bone or tendon is identified. No definite vascular abnormalities.   Impression: This is an abnormal study. The findings are most consistent with the following: Right ulnar mononeuropathy at the elbow, with active/ongoing denervation changes, severe in degree electrically. Neuromuscular ultrasound findings of hyperechoic area in right ulnar nerve at the elbow could represent calcification or scar among other possibilities. Overlapping residuals of an old intraspinal canal lesion (ie: motor radiculopathy) at the right C8 root or segment, which is difficult to grade the severity due to #1 above. Right median mononeuropathy at or distal to the wrist, consistent with carpal tunnel syndrome, mild in degree electrically.     ___________________________ Venetia Potters, MD Assessment and Plan Assessment & Plan Right ulnar neuropathy with severe motor and sensory deficits and chronic pain Severe right ulnar neuropathy with significant motor and sensory deficits, including muscle wasting, weakness, and near complete loss of sensation. Chronic pain persists despite medication. EMG and nerve ultrasound indicate severe neuropathy with hyperechoic area at the elbow, suggesting possible hemorrhage calcification or bruise. Condition likely worsened post-fall on August 20th, 2025. Developing claw deformity.  - Perform surgical exploration and decompression of the right ulnar nerve to remove scar tissue and ensure no compression. - Consider intraoperative nerve stimulation to improve nerve function postop -  Consider nerve stimulator placement if pain persists, pending insurance coverage. - Discuss potential nerve transfer from pronator quadratus to ulnar nerve if symptoms persist or worsen.  [name] Sharps MD Dept. of Neurosurgery

## 2024-07-31 NOTE — Discharge Instructions (Signed)
°  Avoid lifting objects heavier than 10 pounds (gallon milk jug).  Where possible, avoid household activities that involve lifting, bending, reaching, pushing, or pulling such as laundry, vacuuming, grocery shopping, and childcare. Try to arrange for help from friends and family for these activities while you heal.  You should not drive until you are no longer taking narcotic pain medication and you feel safe to operate a moving vehicle.     You should rest at home and let your body heal.   You may remove your bandage at 3 days post-op.  You may shower three days after your surgery.  After showering, lightly dab your incision dry. Do not take a tub bath or go swimming until approved by your doctor at your follow-up appointment.  If you smoke, we strongly recommend that you quit.  Smoking has been proven to interfere with wound healing will dramatically reduce the success rate of your surgery. Please contact [facility] (800-QUIT-NOW) and use the resources at www.[facility].com for assistance in stopping smoking.  Surgical Incision    The steri-strips/glue should begin to peel away within about a week. Diet           You may return to your usual diet. Be sure to stay hydrated.  You have been prescribed narcotic pain medications.  This often will cause constipation along with the anesthesia that you underwent.  Please obtain Colace and senna. This has been sent to your local pharmacy, but at times it is not covered by insurance and you may obtain it over the counter..  You should take a stool softener and laxative for the duration of you taking the narcotic pain medications.  When to Contact Us   Contact us  immediately if you have any: New numbness or weakness Pain that is progressively getting worse, and is not relieved by your pain medication, muscle relaxers, rest, and warm compresses Bleeding, redness, swelling, pain, or drainage from surgical incision Chills or flu-like symptoms Fever  greater than 101.0 F (38.3 C) Inability to eat, drink fluids, or take medications Problems with bowel or bladder functions Difficulty breathing or shortness of breath Warmth, tenderness, or swelling in your calf Contact Information How to contact us :  If you have any questions/concerns before or after surgery, you can reach us  at [phone], or you can send a mychart message. We can be reached by phone or mychart 8am-4pm, Monday-Friday.  *Please note: Calls after 4pm are forwarded to a third party answering service. Mychart messages are not routinely monitored during evenings, weekends, and holidays. Please call our office to contact the answering service for urgent concerns during non-business hours.

## 2024-07-31 NOTE — Op Note (Signed)
 Indications: 68 year old man with history of progressive severe right-sided ulnar neuropathy and hand weakness after a traumatic elbow injury.  Loss significant function with significant right upper extremity wasting.  Was referred after EMG demonstrated severe ulnar neuropathy with swelling of the nerve at the cubital tunnel.    Findings: Neuroma  swelling noted at the elbow  Preoperative Diagnosis: Severe Ulnar Neuropathy  Postoperative Diagnosis: Severe Ulnar Neuropathy   EBL: Minimal IVF: See anesthesia report Drains: none Disposition:Stable to PACU Complications: none  No foley catheter was placed.   Preoperative Note 68 year old man with a history of progressive right ulnar neuropathy and hand weakness refractory to conservative management.  They had tried rest, padding, and watchful waiting but had continued progressive symptoms.  Given the progression of her ulnar neuropathy plan was made for ulnar nerve decompression without transposition.  Risk of surgery is discussed and include: Infection, bleeding, wound healing issues, nerve injury, pain, failure to relieve the symptoms, need for further surgery.  Procedure:  1) right ulnar nerve decompression at the elbow 2) placement of a right ulnar peripheral nerve stimulator   Procedure: After obtaining informed consent, the patient taken to the operating room, placed in supine position, monitored anesthesia care was induced.  They were given preoperative antibiotics.  Prepped and draped in the usual fashion.  Comprehensive timeout was performed verifying the patient's name, MRN, planned procedure.  The humerus was padded, elbow was externally rotated, a curvilinear incision over the medial aspect of the elbow was planned.  This went proximal to the medial epicondyle as well as distal to cover the space from the cubital tunnel to the intermuscular septum/arcade of Struthers.  Local anesthetic was injected, skin was opened sharply,  the skin was dissected down to the subcutaneous fascia.  Care was taken not to sacrifice any cutaneous nerves while doing this dissection.  We are able to feel and palpate the intermuscular septum.  This was where we are able to identify the nerve initially.  The nerve was underneath the septum but not being compressed by it, it was however being compressed by a arcade of Struthers.  This was divided sharply.  We then continued to follow the nerve distally decompressing on the way.  As we approached the retrocondylar groove the nerve was swollen and appeared to be consistent with a focal injury., we removed all the soft tissue overlying.   We then continue to follow it distally.  We are able to identify the fascia overlying the heads of the FCU.  This was divided sharply.  We are then able to split the heads of the FCU and identify Osborne's band.  This was severely compressive.  We then divided this.  At this point the nerve was well decompressed from proximal to the arcade of Struthers to distal to Osborne's band.  There were no areas of ongoing compression.  The nerve did not dislocate.  When the elbow was bent there were no evidence of any ongoing compression.    Nerve stimulator was placed in a J shape at the ulnar nerve with the stimulating portion tangential across the nerve.  It was tunneled to the upper medial portion of the bicep.  The wound was copiously irrigated.  The wound was then closed in multiple layers.  Sterile dressing was applied.  No immediate complications.  Sponge and pattie counts were correct at the end of the procedure.   [facility] Decamp assisted in the procedure. An assistant was required for this procedure due to  the complexity.  The assistant provided assistance in tissue manipulation and suction, and was required for the successful and safe performance of the procedure. I performed the critical portions of the procedure.  Penne MICAEL Sharps, MD/MSCR

## 2024-07-31 NOTE — Anesthesia Preprocedure Evaluation (Signed)
 Anesthesia Evaluation  Patient identified by MRN, date of birth, ID band Patient awake    Reviewed: Allergy & Precautions, H&P , NPO status , Patient's Chart, lab work & pertinent test results, reviewed documented beta blocker date and time   History of Anesthesia Complications Negative for: history of anesthetic complications  Airway Mallampati: II  TM Distance: >3 FB Neck ROM: full    Dental  (+) Dental Advidsory Given, Poor Dentition, Missing   Pulmonary neg pulmonary ROS, former smoker   Pulmonary exam normal breath sounds clear to auscultation       Cardiovascular Exercise Tolerance: Good hypertension, (-) angina (-) Past MI and (-) Cardiac Stents Normal cardiovascular exam(-) dysrhythmias (-) Valvular Problems/Murmurs Rhythm:regular Rate:Normal     Neuro/Psych negative neurological ROS  negative psych ROS   GI/Hepatic Neg liver ROS,GERD  Medicated and Controlled,,  Endo/Other  negative endocrine ROS    Renal/GU Renal disease (kidney stones)  negative genitourinary   Musculoskeletal   Abdominal   Peds  Hematology negative hematology ROS (+)   Anesthesia Other Findings Past Medical History: No date: Arthritis     Comment:  hands and shoulders (left shoulder is worse than right) 03/24/2014: Aspiration pneumonia (HCC)     Comment:  after surgery for colostomy No date: BPH (benign prostatic hyperplasia) No date: Colostomy in place [facility]) 03/24/2014: Complication of anesthesia     Comment:  aspiration pneumonia after surgery for colostomy, no               problem with  surgeries since then No date: Difficulty urinating No date: Dyspnea     Comment:  @ times due to hernias, seeing [facility] for  surgical               consultation 04-20-2022, can lie flat No date: ED (erectile dysfunction) No date: GERD (gastroesophageal reflux disease) No date: History of kidney stones No date: Hyperlipidemia No date:  Hypertension No date: Ileostomy present (HCC) No date: Incisional hernia     Comment:  right abdomen No date: Parastomal hernia left 12/25/2013: Rectal cancer, uT2uN0, s/p LAR/ileostomy 03/20/2014  colostomy Aug2015     Comment:  TCS MAY 2015  No date: Restless leg No date: Right inguinal hernia No date: Shortening, leg, congenital     Comment:  Left No date: Ulnar neuropathy at elbow, right   Reproductive/Obstetrics negative OB ROS                              Anesthesia Physical Anesthesia Plan  ASA: 3  Anesthesia Plan: General   Post-op Pain Management:    Induction: Intravenous  PONV Risk Score and Plan: 2 and Propofol  infusion and TIVA  Airway Management Planned: Natural Airway and Simple Face Mask  Additional Equipment:   Intra-op Plan:   Post-operative Plan:   Informed Consent: I have reviewed the patients History and Physical, chart, labs and discussed the procedure including the risks, benefits and alternatives for the proposed anesthesia with the patient or authorized representative who has indicated his/her understanding and acceptance.     Dental Advisory Given  Plan Discussed with: Anesthesiologist, CRNA and Surgeon  Anesthesia Plan Comments:         Anesthesia Quick Evaluation

## 2024-07-31 NOTE — Interval H&P Note (Signed)
 History and Physical Interval Note:  07/31/2024 8:21 AM  [name] Ana  has presented today for surgery, with the diagnosis of Severe Ulnar Neuropathy.  The various methods of treatment have been discussed with the patient and family. After consideration of risks, benefits and other options for treatment, the patient has consented to  Procedures with comments: Right Ulnar Nerve Decompression at the Elbow (Right) Insertion of Nerve Stimulator Right Ulnar Nerve (Right) - Bioventuss as a surgical intervention.  The patient's history has been reviewed, patient examined, no change in status, stable for surgery.  I have reviewed the patient's chart and labs.  Questions were answered to the patient's satisfaction.    Heart and lungs clear   [name] Sharps

## 2024-07-31 NOTE — Transfer of Care (Signed)
 Immediate Anesthesia Transfer of Care Note  Patient: [name]  Procedure(s) Performed: Right Ulnar Nerve Decompression at the Elbow (Right) Insertion of Nerve Stimulator Right Ulnar Nerve (Right)  Patient Location: PACU  Anesthesia Type:General  Level of Consciousness: drowsy and patient cooperative  Airway & Oxygen Therapy: Patient Spontanous Breathing and Patient connected to face mask oxygen  Post-op Assessment: Report given to RN, Post -op Vital signs reviewed and stable, and Patient moving all extremities X 4  Post vital signs: Reviewed and stable  Last Vitals:  Vitals Value Taken Time  BP    Temp    Pulse 58 07/31/24 10:00  Resp 17 07/31/24 10:00  SpO2 97 % 07/31/24 10:00  Vitals shown include unfiled device data.  Last Pain:  Vitals:   07/31/24 0716  TempSrc: Temporal  PainSc: 6          Complications: No notable events documented.

## 2024-07-31 NOTE — Anesthesia Postprocedure Evaluation (Signed)
 Anesthesia Post Note  Patient: JAIRON RIPBERGER  Procedure(s) Performed: Right Ulnar Nerve Decompression at the Elbow (Right) Insertion of Nerve Stimulator Right Ulnar Nerve (Right)  Patient location during evaluation: PACU Anesthesia Type: General Level of consciousness: awake and alert Pain management: pain level controlled Vital Signs Assessment: post-procedure vital signs reviewed and stable Respiratory status: spontaneous breathing, nonlabored ventilation, respiratory function stable and patient connected to nasal cannula oxygen Cardiovascular status: blood pressure returned to baseline and stable Postop Assessment: no apparent nausea or vomiting Anesthetic complications: no   No notable events documented.   Last Vitals:  Vitals:   07/31/24 1045 07/31/24 1113  BP: (!) 161/81 (!) 150/74  Pulse: (!) 51 (!) 56  Resp: 16 17  Temp: (!) 36.1 C (!) 36.2 C  SpO2: 97% 96%    Last Pain:  Vitals:   07/31/24 1113  TempSrc: Temporal  PainSc: 0-No pain                 Prentice Murphy

## 2024-08-01 ENCOUNTER — Encounter: Payer: Self-pay | Admitting: Neurosurgery

## 2024-08-12 ENCOUNTER — Ambulatory Visit: Admitting: Physician Assistant

## 2024-08-12 VITALS — BP 118/72 | Temp 98.7°F | Ht 65.0 in | Wt 181.0 lb

## 2024-08-12 DIAGNOSIS — Z09 Encounter for follow-up examination after completed treatment for conditions other than malignant neoplasm: Secondary | ICD-10-CM

## 2024-08-12 DIAGNOSIS — G5621 Lesion of ulnar nerve, right upper limb: Secondary | ICD-10-CM

## 2024-08-12 MED ORDER — TRAMADOL HCL 50 MG PO TABS: 50.0000 mg | ORAL_TABLET | Freq: Four times a day (QID) | ORAL | 0 refills | Status: DC | PRN

## 2024-08-12 NOTE — Progress Notes (Signed)
" ° °  REFERRING PHYSICIAN:  Cook, Jayce G, Do [address] [place],  [place] [address]  DOS: 07/31/24, right ulnar nerve decompression and insertion of nerve stimulator  HISTORY OF PRESENT ILLNESS: [name] is 2 weeks status post  right ulnar nerve decompression and insertion of nerve stimulator. Overall, he is doing well.  He feels as though his strength is better and his pain is improved.  He still has some residual numbness in his fingers.  He is inquiring about orthopedic surgery referral regarding cyst on his wrist that he would like removed.     PHYSICAL EXAMINATION:  NEUROLOGICAL:  General: In no acute distress.   Awake, alert, oriented to person, place, and time.  Pupils equal round and reactive to light.  Facial tone is symmetric.    Strength:  Intrinsic weakness continues to be present in his right ulnar intrinsics.   Incision c/d/I  Imaging:  No interval imaging  Assessment / Plan: CLESTER CHLEBOWSKI is doing well after right ulnar nerve decompression and insertion of nerve stimulator on 07/31/2024. We discussed activity escalation and I have advised the patient to lift up to 10 pounds until 6 weeks after surgery, then increase up to 25 pounds until 12 weeks after surgery.  After 12 weeks post-op, the patient advised to increase activity as tolerated. he will return to clinic in approximately 1 month for his 6-week postop visit.  Stimulator representatives were present during his postop visit today to help optimize the stimulator for his pain.  Tramadol  refill given for his pain.  Will reach out to team regarding orthopedic surgery referral regarding cyst on his wrist.    Advised to contact the office if any questions or concerns arise.   Lyle Decamp PA-C Dept of Neurosurgery   "

## 2024-08-12 NOTE — Progress Notes (Signed)
 Pharmacy Quality Measure Review  This patient is appearing on a report for being at risk of failing the adherence measure for cholesterol (statin) medications this calendar year.   Medication: Rosuvastatin  10 mg Last fill date: 07/14/24 for 90 day supply  Insurance report was not up to date. No action needed at this time.   Jenkins Graces, PharmD PGY1 Pharmacy Resident

## 2024-08-19 DIAGNOSIS — R200 Anesthesia of skin: Secondary | ICD-10-CM | POA: Insufficient documentation

## 2024-08-19 DIAGNOSIS — M25521 Pain in right elbow: Secondary | ICD-10-CM | POA: Insufficient documentation

## 2024-08-19 DIAGNOSIS — G8929 Other chronic pain: Secondary | ICD-10-CM | POA: Insufficient documentation

## 2024-08-19 DIAGNOSIS — G5621 Lesion of ulnar nerve, right upper limb: Secondary | ICD-10-CM | POA: Insufficient documentation

## 2024-08-29 ENCOUNTER — Telehealth: Payer: Self-pay

## 2024-08-29 NOTE — Telephone Encounter (Signed)
 Copied from CRM [id number]. Topic: Clinical - Order For Equipment >> Aug 28, 2024 11:25 AM Darshell M wrote: Reason for CRM: Patient requesting new order for patient ostomy supplies to [facility] [phone]. No mychart.

## 2024-09-01 ENCOUNTER — Encounter: Admitting: Physician Assistant

## 2024-09-01 NOTE — Telephone Encounter (Signed)
--  have faxed over orders for ostomy supplies order and most recent notes - confirmation received ---   Copied from CRM [id number]. Topic: Clinical - Order For Equipment >> Aug 28, 2024 11:25 AM Darshell M wrote: Reason for CRM: Patient requesting new order for patient ostomy supplies to [facility] [phone]. No mychart.

## 2024-09-08 ENCOUNTER — Telehealth: Payer: Self-pay

## 2024-09-08 ENCOUNTER — Encounter: Payer: Self-pay | Admitting: Family Medicine

## 2024-09-08 ENCOUNTER — Encounter: Payer: Self-pay | Admitting: Neurosurgery

## 2024-09-08 ENCOUNTER — Ambulatory Visit: Admitting: Neurosurgery

## 2024-09-08 VITALS — BP 130/84 | Temp 98.1°F | Ht 65.0 in | Wt 183.1 lb

## 2024-09-08 DIAGNOSIS — Z09 Encounter for follow-up examination after completed treatment for conditions other than malignant neoplasm: Secondary | ICD-10-CM

## 2024-09-08 DIAGNOSIS — G5621 Lesion of ulnar nerve, right upper limb: Secondary | ICD-10-CM

## 2024-09-08 NOTE — Telephone Encounter (Signed)
 Copied from CRM [id number]. Topic: General - Other >> Sep 04, 2024  2:57 PM Antony RAMAN wrote: Reason for CRM: [facility]essa from [facility] calling needing more details about what exact ostomy supplies are needed for the patient Fax-[phone]

## 2024-09-08 NOTE — Progress Notes (Signed)
" ° °  REFERRING PHYSICIAN:  Cook, Jayce G, Do [address] [place],  [place] [address]  DOS: 07/31/24, right ulnar nerve decompression and insertion of nerve stimulator  Discussed the use of AI scribe software for clinical note transcription with the patient, who gave verbal consent to proceed.  History of Present Illness [name] is a 69 year old male with severe right ulnar neuropathy who presents for postoperative follow-up.  He is recovering from recent neurosurgical intervention for severe left right neuropathy. He feels that his pain is much better and that he's getting improved hand function little by little.  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  He uses a nerve stimulator on the arm for symptom relief and activates it as needed, with consistent benefit. He prefers near-continuous use and recently replaced the device after it detached.   He reports overall improvement with reduced pain compared to prior visits, remains active with farm work, and has no new neurological deficits or systemic symptoms.    PHYSICAL EXAMINATION:  NEUROLOGICAL:  General: In no acute distress.   Awake, alert, oriented to person, place, and time.  Pupils equal round and reactive to light.  Facial tone is symmetric.    Strength:  Intrinsic weakness continues to be present in his right ulnar intrinsics.  Incision c/d/I  Imaging:  No interval imaging  Assessment and Plan Assessment & Plan Severe right ulnar neuropathy Recovering from recent surgical intervention with symptomatic improvement, particularly in the fifth digit. Persistent hand tightness and discomfort raise concern for possible soft tissue involvement near the nerve distially, he's been seen by hand surgery for this and they are inquiring about MR alternative imaging modalities are under consideration to evaluate for soft tissue or nerve entrapment.I. Continues to benefit from  stimulator device for symptom relief. - Assessed surgical incision; healing well. - Advised continued use of stimulator for symptom relief without need for regular removal. - Planned to consult with Dr. Ezra and device representatives regarding feasibility of MRI versus soft tissue ultrasound, given potential interference from the stimulator. - If MRI is not feasible, will proceed with ultrasound to evaluate for soft tissue and nerve involvement.  [name] Sharps MD  "

## 2024-09-09 NOTE — Telephone Encounter (Signed)
 Called pt unable to leave voice mail

## 2024-09-10 NOTE — Telephone Encounter (Signed)
 Unable to leave voicemail - calling tomorrow and sending letter

## 2024-09-11 ENCOUNTER — Telehealth: Payer: Self-pay

## 2024-09-11 NOTE — Telephone Encounter (Signed)
 Unable to leave voicemail - mailing letter

## 2024-09-11 NOTE — Telephone Encounter (Signed)
 Spoke with patient regarding ostomy supplies, will forward to provider for prescriptions needed

## 2024-09-17 ENCOUNTER — Telehealth: Payer: Self-pay

## 2024-09-17 NOTE — Telephone Encounter (Signed)
 Copied from CRM #[id number]. Topic: Clinical - Order For Equipment >> Sep 17, 2024 10:48 AM Eva FALCON wrote: Reason for CRM: Pt states last week he spoke to [place] about his ostomy supplies he needed. States he forgot to mention he needs the [employer] adapt barrier extenders, item number [phone] sent to [facility]. Is hoping Deane can give him a call back [phone].

## 2024-09-29 ENCOUNTER — Encounter: Admitting: Neurosurgery

## 2024-10-02 ENCOUNTER — Ambulatory Visit: Admitting: Family Medicine

## 2024-10-13 ENCOUNTER — Encounter: Admitting: Physician Assistant

## 2024-10-15 ENCOUNTER — Encounter: Admitting: Physician Assistant

## 2024-11-03 ENCOUNTER — Encounter: Admitting: Physician Assistant

## 2024-12-30 ENCOUNTER — Ambulatory Visit: Admitting: Urology
